# Patient Record
Sex: Male | Born: 1940 | Race: White | Hispanic: No | Marital: Married | Smoking: Former smoker
Health system: Southern US, Community
[De-identification: ages and names within clinical notes are randomized; demographics above are authoritative.]

## PROBLEM LIST (undated history)

## (undated) DIAGNOSIS — I5020 Unspecified systolic (congestive) heart failure: Secondary | ICD-10-CM

## (undated) DIAGNOSIS — H9190 Unspecified hearing loss, unspecified ear: Secondary | ICD-10-CM

## (undated) DIAGNOSIS — Z8739 Personal history of other diseases of the musculoskeletal system and connective tissue: Secondary | ICD-10-CM

## (undated) DIAGNOSIS — I2510 Atherosclerotic heart disease of native coronary artery without angina pectoris: Secondary | ICD-10-CM

## (undated) DIAGNOSIS — L570 Actinic keratosis: Secondary | ICD-10-CM

## (undated) DIAGNOSIS — E785 Hyperlipidemia, unspecified: Secondary | ICD-10-CM

## (undated) DIAGNOSIS — I428 Other cardiomyopathies: Secondary | ICD-10-CM

## (undated) DIAGNOSIS — M545 Low back pain: Secondary | ICD-10-CM

## (undated) DIAGNOSIS — M109 Gout, unspecified: Secondary | ICD-10-CM

## (undated) DIAGNOSIS — K567 Ileus, unspecified: Secondary | ICD-10-CM

## (undated) DIAGNOSIS — I219 Acute myocardial infarction, unspecified: Secondary | ICD-10-CM

## (undated) DIAGNOSIS — M5135 Other intervertebral disc degeneration, thoracolumbar region: Secondary | ICD-10-CM

## (undated) DIAGNOSIS — K929 Disease of digestive system, unspecified: Secondary | ICD-10-CM

## (undated) DIAGNOSIS — J189 Pneumonia, unspecified organism: Secondary | ICD-10-CM

## (undated) DIAGNOSIS — I209 Angina pectoris, unspecified: Secondary | ICD-10-CM

## (undated) DIAGNOSIS — R634 Abnormal weight loss: Secondary | ICD-10-CM

## (undated) DIAGNOSIS — R0600 Dyspnea, unspecified: Secondary | ICD-10-CM

## (undated) DIAGNOSIS — J449 Chronic obstructive pulmonary disease, unspecified: Secondary | ICD-10-CM

## (undated) DIAGNOSIS — B999 Unspecified infectious disease: Secondary | ICD-10-CM

## (undated) DIAGNOSIS — I509 Heart failure, unspecified: Secondary | ICD-10-CM

## (undated) DIAGNOSIS — K222 Esophageal obstruction: Secondary | ICD-10-CM

## (undated) DIAGNOSIS — K435 Parastomal hernia without obstruction or  gangrene: Secondary | ICD-10-CM

## (undated) DIAGNOSIS — I700 Atherosclerosis of aorta: Secondary | ICD-10-CM

## (undated) DIAGNOSIS — K649 Unspecified hemorrhoids: Secondary | ICD-10-CM

## (undated) DIAGNOSIS — D126 Benign neoplasm of colon, unspecified: Secondary | ICD-10-CM

## (undated) DIAGNOSIS — E041 Nontoxic single thyroid nodule: Secondary | ICD-10-CM

## (undated) DIAGNOSIS — M1990 Unspecified osteoarthritis, unspecified site: Secondary | ICD-10-CM

## (undated) DIAGNOSIS — N189 Chronic kidney disease, unspecified: Secondary | ICD-10-CM

## (undated) DIAGNOSIS — I10 Essential (primary) hypertension: Secondary | ICD-10-CM

## (undated) DIAGNOSIS — K449 Diaphragmatic hernia without obstruction or gangrene: Secondary | ICD-10-CM

## (undated) DIAGNOSIS — N400 Enlarged prostate without lower urinary tract symptoms: Secondary | ICD-10-CM

## (undated) DIAGNOSIS — K219 Gastro-esophageal reflux disease without esophagitis: Secondary | ICD-10-CM

## (undated) DIAGNOSIS — Z87442 Personal history of urinary calculi: Secondary | ICD-10-CM

## (undated) DIAGNOSIS — Z7901 Long term (current) use of anticoagulants: Secondary | ICD-10-CM

## (undated) DIAGNOSIS — N200 Calculus of kidney: Secondary | ICD-10-CM

## (undated) DIAGNOSIS — J45909 Unspecified asthma, uncomplicated: Secondary | ICD-10-CM

## (undated) DIAGNOSIS — Z955 Presence of coronary angioplasty implant and graft: Secondary | ICD-10-CM

## (undated) DIAGNOSIS — M170 Bilateral primary osteoarthritis of knee: Secondary | ICD-10-CM

## (undated) DIAGNOSIS — K8020 Calculus of gallbladder without cholecystitis without obstruction: Secondary | ICD-10-CM

## (undated) HISTORY — DX: Actinic keratosis: L57.0

## (undated) HISTORY — DX: Low back pain: M54.5

## (undated) HISTORY — PX: OTHER SURGICAL HISTORY: SHX169

## (undated) HISTORY — DX: Essential (primary) hypertension: I10

## (undated) HISTORY — DX: Chronic obstructive pulmonary disease, unspecified: J44.9

## (undated) HISTORY — DX: Unspecified hearing loss, unspecified ear: H91.90

## (undated) HISTORY — DX: Atherosclerotic heart disease of native coronary artery without angina pectoris: I25.10

## (undated) HISTORY — DX: Bilateral primary osteoarthritis of knee: M17.0

## (undated) HISTORY — PX: COLON SURGERY: SHX602

## (undated) HISTORY — DX: Hyperlipidemia, unspecified: E78.5

## (undated) HISTORY — DX: Disease of digestive system, unspecified: K92.9

## (undated) HISTORY — DX: Personal history of other diseases of the musculoskeletal system and connective tissue: Z87.39

---

## 2002-12-19 DIAGNOSIS — I219 Acute myocardial infarction, unspecified: Secondary | ICD-10-CM

## 2002-12-19 HISTORY — DX: Acute myocardial infarction, unspecified: I21.9

## 2005-01-06 ENCOUNTER — Emergency Department: Payer: Self-pay | Admitting: Internal Medicine

## 2005-01-06 DIAGNOSIS — I2109 ST elevation (STEMI) myocardial infarction involving other coronary artery of anterior wall: Secondary | ICD-10-CM

## 2005-01-06 HISTORY — PX: CORONARY ANGIOPLASTY WITH STENT PLACEMENT: SHX49

## 2005-01-06 HISTORY — DX: ST elevation (STEMI) myocardial infarction involving other coronary artery of anterior wall: I21.09

## 2005-07-26 ENCOUNTER — Ambulatory Visit: Payer: Self-pay | Admitting: Unknown Physician Specialty

## 2005-10-20 ENCOUNTER — Ambulatory Visit: Payer: Self-pay | Admitting: Unknown Physician Specialty

## 2005-12-30 ENCOUNTER — Ambulatory Visit: Payer: Self-pay | Admitting: Orthopaedic Surgery

## 2006-01-09 ENCOUNTER — Ambulatory Visit: Payer: Self-pay | Admitting: Orthopaedic Surgery

## 2006-01-09 ENCOUNTER — Other Ambulatory Visit: Payer: Self-pay

## 2006-01-20 ENCOUNTER — Ambulatory Visit: Payer: Self-pay | Admitting: Orthopaedic Surgery

## 2007-08-09 ENCOUNTER — Ambulatory Visit: Payer: Self-pay

## 2008-03-31 ENCOUNTER — Ambulatory Visit: Payer: Self-pay | Admitting: Unknown Physician Specialty

## 2008-05-15 ENCOUNTER — Other Ambulatory Visit: Payer: Self-pay

## 2008-05-15 ENCOUNTER — Observation Stay: Payer: Self-pay | Admitting: Internal Medicine

## 2008-05-16 HISTORY — PX: LEFT HEART CATH AND CORONARY ANGIOGRAPHY: CATH118249

## 2009-03-30 HISTORY — PX: CORONARY ANGIOPLASTY WITH STENT PLACEMENT: SHX49

## 2009-05-14 ENCOUNTER — Encounter: Payer: Self-pay | Admitting: Internal Medicine

## 2009-05-19 ENCOUNTER — Encounter: Payer: Self-pay | Admitting: Internal Medicine

## 2009-06-18 ENCOUNTER — Encounter: Payer: Self-pay | Admitting: Internal Medicine

## 2010-11-16 ENCOUNTER — Inpatient Hospital Stay: Payer: Self-pay | Admitting: Surgery

## 2012-02-13 DIAGNOSIS — Z87891 Personal history of nicotine dependence: Secondary | ICD-10-CM | POA: Insufficient documentation

## 2012-02-13 DIAGNOSIS — E785 Hyperlipidemia, unspecified: Secondary | ICD-10-CM

## 2012-02-13 DIAGNOSIS — I2510 Atherosclerotic heart disease of native coronary artery without angina pectoris: Secondary | ICD-10-CM

## 2012-02-13 DIAGNOSIS — I10 Essential (primary) hypertension: Secondary | ICD-10-CM

## 2012-02-13 HISTORY — DX: Atherosclerotic heart disease of native coronary artery without angina pectoris: I25.10

## 2012-02-13 HISTORY — DX: Essential (primary) hypertension: I10

## 2012-02-13 HISTORY — DX: Hyperlipidemia, unspecified: E78.5

## 2013-03-09 DIAGNOSIS — K929 Disease of digestive system, unspecified: Secondary | ICD-10-CM

## 2013-03-09 DIAGNOSIS — J45909 Unspecified asthma, uncomplicated: Secondary | ICD-10-CM | POA: Insufficient documentation

## 2013-03-09 DIAGNOSIS — H9190 Unspecified hearing loss, unspecified ear: Secondary | ICD-10-CM

## 2013-03-09 HISTORY — DX: Unspecified hearing loss, unspecified ear: H91.90

## 2013-03-09 HISTORY — DX: Disease of digestive system, unspecified: K92.9

## 2013-04-29 ENCOUNTER — Ambulatory Visit: Payer: Self-pay | Admitting: Unknown Physician Specialty

## 2013-04-30 LAB — PATHOLOGY REPORT

## 2014-08-12 ENCOUNTER — Ambulatory Visit: Payer: Self-pay | Admitting: Internal Medicine

## 2014-12-19 HISTORY — PX: MEDIAL PARTIAL KNEE REPLACEMENT: SHX5965

## 2015-05-05 DIAGNOSIS — M170 Bilateral primary osteoarthritis of knee: Secondary | ICD-10-CM | POA: Insufficient documentation

## 2015-05-05 HISTORY — DX: Bilateral primary osteoarthritis of knee: M17.0

## 2015-05-07 ENCOUNTER — Other Ambulatory Visit: Payer: Self-pay | Admitting: Unknown Physician Specialty

## 2015-05-07 DIAGNOSIS — M170 Bilateral primary osteoarthritis of knee: Secondary | ICD-10-CM

## 2015-05-13 ENCOUNTER — Ambulatory Visit
Admission: RE | Admit: 2015-05-13 | Discharge: 2015-05-13 | Disposition: A | Discharge status: Home / Self Care | Payer: Medicare Other | Source: Ambulatory Visit | Attending: Unknown Physician Specialty | Admitting: Unknown Physician Specialty

## 2015-05-13 DIAGNOSIS — M948X6 Other specified disorders of cartilage, lower leg: Secondary | ICD-10-CM | POA: Diagnosis not present

## 2015-05-13 DIAGNOSIS — S83241A Other tear of medial meniscus, current injury, right knee, initial encounter: Secondary | ICD-10-CM | POA: Diagnosis not present

## 2015-05-13 DIAGNOSIS — M170 Bilateral primary osteoarthritis of knee: Secondary | ICD-10-CM

## 2015-05-13 DIAGNOSIS — M25462 Effusion, left knee: Secondary | ICD-10-CM | POA: Diagnosis not present

## 2015-05-13 DIAGNOSIS — S83242A Other tear of medial meniscus, current injury, left knee, initial encounter: Secondary | ICD-10-CM | POA: Insufficient documentation

## 2015-05-13 DIAGNOSIS — M25461 Effusion, right knee: Secondary | ICD-10-CM | POA: Diagnosis not present

## 2015-05-13 DIAGNOSIS — M2242 Chondromalacia patellae, left knee: Secondary | ICD-10-CM | POA: Diagnosis not present

## 2015-06-24 ENCOUNTER — Other Ambulatory Visit: Payer: Self-pay | Admitting: Unknown Physician Specialty

## 2015-06-24 DIAGNOSIS — M1711 Unilateral primary osteoarthritis, right knee: Secondary | ICD-10-CM

## 2015-06-26 ENCOUNTER — Ambulatory Visit
Admission: RE | Admit: 2015-06-26 | Discharge: 2015-06-26 | Disposition: A | Discharge status: Home / Self Care | Payer: Medicare Other | Source: Ambulatory Visit | Attending: Unknown Physician Specialty | Admitting: Unknown Physician Specialty

## 2015-06-26 DIAGNOSIS — M1711 Unilateral primary osteoarthritis, right knee: Secondary | ICD-10-CM

## 2015-06-26 DIAGNOSIS — N201 Calculus of ureter: Secondary | ICD-10-CM | POA: Diagnosis not present

## 2015-06-26 DIAGNOSIS — M179 Osteoarthritis of knee, unspecified: Secondary | ICD-10-CM | POA: Diagnosis not present

## 2015-06-26 DIAGNOSIS — M25561 Pain in right knee: Secondary | ICD-10-CM | POA: Diagnosis present

## 2015-07-30 DIAGNOSIS — M25551 Pain in right hip: Secondary | ICD-10-CM | POA: Insufficient documentation

## 2015-07-30 DIAGNOSIS — Z96651 Presence of right artificial knee joint: Secondary | ICD-10-CM | POA: Insufficient documentation

## 2015-08-17 ENCOUNTER — Other Ambulatory Visit: Payer: Self-pay | Admitting: Unknown Physician Specialty

## 2015-08-17 DIAGNOSIS — M1712 Unilateral primary osteoarthritis, left knee: Secondary | ICD-10-CM

## 2015-08-17 DIAGNOSIS — M1711 Unilateral primary osteoarthritis, right knee: Secondary | ICD-10-CM

## 2015-09-04 ENCOUNTER — Ambulatory Visit
Admission: RE | Admit: 2015-09-04 | Discharge: 2015-09-04 | Disposition: A | Discharge status: Home / Self Care | Payer: Medicare Other | Source: Ambulatory Visit | Attending: Unknown Physician Specialty | Admitting: Unknown Physician Specialty

## 2015-09-04 ENCOUNTER — Other Ambulatory Visit: Payer: Self-pay | Admitting: Unknown Physician Specialty

## 2015-09-04 DIAGNOSIS — M79604 Pain in right leg: Secondary | ICD-10-CM | POA: Diagnosis not present

## 2015-09-04 DIAGNOSIS — M79661 Pain in right lower leg: Secondary | ICD-10-CM

## 2015-09-04 DIAGNOSIS — M7989 Other specified soft tissue disorders: Secondary | ICD-10-CM | POA: Insufficient documentation

## 2015-10-21 ENCOUNTER — Ambulatory Visit
Admission: RE | Admit: 2015-10-21 | Discharge: 2015-10-21 | Disposition: A | Discharge status: Home / Self Care | Payer: Medicare Other | Source: Ambulatory Visit | Attending: Unknown Physician Specialty | Admitting: Unknown Physician Specialty

## 2015-10-21 DIAGNOSIS — M1712 Unilateral primary osteoarthritis, left knee: Secondary | ICD-10-CM | POA: Diagnosis not present

## 2015-10-21 DIAGNOSIS — M7989 Other specified soft tissue disorders: Secondary | ICD-10-CM | POA: Insufficient documentation

## 2015-10-21 DIAGNOSIS — M8580 Other specified disorders of bone density and structure, unspecified site: Secondary | ICD-10-CM | POA: Insufficient documentation

## 2015-10-22 ENCOUNTER — Other Ambulatory Visit: Payer: Self-pay | Admitting: Unknown Physician Specialty

## 2015-10-22 DIAGNOSIS — M1712 Unilateral primary osteoarthritis, left knee: Secondary | ICD-10-CM

## 2015-10-23 ENCOUNTER — Ambulatory Visit
Admission: RE | Admit: 2015-10-23 | Discharge: 2015-10-23 | Disposition: A | Discharge status: Home / Self Care | Payer: Medicare Other | Source: Ambulatory Visit | Attending: Internal Medicine | Admitting: Internal Medicine

## 2015-10-23 ENCOUNTER — Other Ambulatory Visit: Payer: Self-pay | Admitting: Internal Medicine

## 2015-10-23 DIAGNOSIS — M7989 Other specified soft tissue disorders: Secondary | ICD-10-CM | POA: Insufficient documentation

## 2015-12-23 DIAGNOSIS — Z96652 Presence of left artificial knee joint: Secondary | ICD-10-CM | POA: Insufficient documentation

## 2016-04-14 DIAGNOSIS — M545 Low back pain, unspecified: Secondary | ICD-10-CM | POA: Insufficient documentation

## 2016-04-14 HISTORY — DX: Low back pain, unspecified: M54.50

## 2016-11-03 DIAGNOSIS — Z96652 Presence of left artificial knee joint: Secondary | ICD-10-CM | POA: Insufficient documentation

## 2016-12-09 DIAGNOSIS — Z8739 Personal history of other diseases of the musculoskeletal system and connective tissue: Secondary | ICD-10-CM | POA: Insufficient documentation

## 2016-12-09 HISTORY — DX: Personal history of other diseases of the musculoskeletal system and connective tissue: Z87.39

## 2017-07-03 ENCOUNTER — Encounter: Payer: Self-pay | Admitting: Urology

## 2017-07-03 ENCOUNTER — Ambulatory Visit (INDEPENDENT_AMBULATORY_CARE_PROVIDER_SITE_OTHER): Payer: Medicare Other | Admitting: Urology

## 2017-07-03 VITALS — BP 143/79 | HR 98 | Ht 69.0 in | Wt 199.0 lb

## 2017-07-03 DIAGNOSIS — R3121 Asymptomatic microscopic hematuria: Secondary | ICD-10-CM

## 2017-07-03 DIAGNOSIS — J449 Chronic obstructive pulmonary disease, unspecified: Secondary | ICD-10-CM

## 2017-07-03 DIAGNOSIS — E785 Hyperlipidemia, unspecified: Secondary | ICD-10-CM

## 2017-07-03 HISTORY — DX: Chronic obstructive pulmonary disease, unspecified: J44.9

## 2017-07-03 HISTORY — DX: Hyperlipidemia, unspecified: E78.5

## 2017-07-03 LAB — MICROSCOPIC EXAMINATION: Bacteria, UA: NONE SEEN

## 2017-07-03 LAB — URINALYSIS, COMPLETE
Bilirubin, UA: NEGATIVE
Glucose, UA: NEGATIVE
Ketones, UA: NEGATIVE
Leukocytes, UA: NEGATIVE
Nitrite, UA: NEGATIVE
Protein, UA: NEGATIVE
Specific Gravity, UA: 1.025 (ref 1.005–1.030)
Urobilinogen, Ur: 0.2 mg/dL (ref 0.2–1.0)
pH, UA: 5.5 (ref 5.0–7.5)

## 2017-07-03 NOTE — Progress Notes (Signed)
07/03/2017 4:34 PM   Todd Rojas December 22, 1940 [id number]  Referring provider: Tracie Harrier, MD [address] [facility] [place], [place] [address]  Chief Complaint  Patient presents with  . Hematuria    New Patient    HPI: 76 year old male who is referred for further evaluation and management of a asymptomatic microscopic hematuria. This was done as part of a annual evaluation for the patient. He was told he had microscopic blood in his urine, is never seen blood himself. The patient denies any progressive urinary tract symptoms including frequency, urgency, dysuria. He has not had any worsening nocturia. He denies a history of urinary tract infections or prostatitis. He has no history of kidney stones.  The patient is a nonsmoker, does not smoke for last 30 years. He has no exposure to chemicals or carcinogenic agents. He has no family history of GU malignancy.    The patient recently had labs performed with a BUN/creatinine that was normal-creatinine 1.2. His PSA was 2.7.   PMH: Past Medical History:  Diagnosis Date  . CAD (coronary artery disease) 02/13/2012   Overview:   12/2004 - Anginal equivalent symptoms:  Left arm pain, weakness, and shortness of breath.   12/2004:  Anterior STEMI.  Transferred to [facility].   01/06/2005 - Cardiac cath:  EF 51%, an occluded mid LAD and a large OM2 with 75% ostial stenosis.  PCI of the mid occluded LAD using a Cypher stent.   03/2005 - 2D echo:  EF >55%, LAE 4.7 cm, no significant valvular disease.   02/2008:  Plavix stopped.   06/2008:  Recurrent angina - symptoms of chest tightness.  Presented to [facility].  Cardiac catheterization.  The patient stated nonobstructive CAD seen.  Plavix restarted at that time.   03/2009 - 2D echo:  EF 35%, akinetic apex and septum.  Stress echocardiogram deferred for cardiac catheterization.   03/30/2009:  PCI 90% proximal LAD with a 3.0 x 18 mm Xience DES.   02/2010:  Slow and gradual return of chest tightness, fatigue and generalized weakness concerning for return of anginal equivalent.  Stress test did not show ischemia to the heart muscle during exercise.  Evidenc  . COPD (chronic obstructive pulmonary disease) (Todd Rojas) 07/03/2017  . Dyslipidemia 02/13/2012   Overview:   03/2009  TC 158, TG 88, HDL 39, LDL 102.  11/2013  TC 148, TG 115, HDL 44.3, LDL 80.7 (done at PCP's office)   10/2014 TC 146  11/2015 TC 123, TG 86, HDL 41.7, LDL 64  11/2016 TC 140, TG 129, HDL 40.1, LDL 74   . GI disease 03/09/2013   Overview:   Dysphagia - EGD demonstrated esophageal stricture which was dilated   GERD   Hemorrhoids   . Hearing loss 03/09/2013  . Hyperlipidemia, unspecified 07/03/2017  . Hypertension 02/13/2012  . Personal history of gout 12/09/2016  . Primary osteoarthritis of both knees 05/05/2015  . Right-sided low back pain without sciatica 04/14/2016    Surgical History: Past Surgical History:  Procedure Laterality Date  . MEDIAL PARTIAL KNEE REPLACEMENT Bilateral 2016    Home Medications:  Allergies as of 07/03/2017   Not on File     Medication List       Accurate as of 07/03/17  4:34 PM. Always use your most recent med list.          allopurinol 300 MG tablet Commonly known as:  ZYLOPRIM Take by mouth.   aspirin EC 81 MG  tablet Take by mouth.   cyanocobalamin 1000 MCG/ML injection Commonly known as:  (VITAMIN B-12) Inject into the muscle.   lisinopril 40 MG tablet Commonly known as:  PRINIVIL,ZESTRIL Take by mouth.   pravastatin 80 MG tablet Commonly known as:  PRAVACHOL Take by mouth.   RIVAROXABAN PO STUDY DRUG - 2.5mg  twice daily       Allergies: Not on File  Family History: Family History  Problem Relation Age of Onset  . Prostate cancer Neg Hx   . Kidney cancer Neg Hx     Social History:  reports that he has quit smoking. He has never used smokeless tobacco. He reports that he does not drink alcohol or use  drugs.  ROS: UROLOGY Frequent Urination?: Yes Hard to postpone urination?: No Burning/pain with urination?: No Get up at night to urinate?: Yes Leakage of urine?: No Urine stream starts and stops?: Yes Trouble starting stream?: No Do you have to strain to urinate?: No Blood in urine?: Yes Urinary tract infection?: No Sexually transmitted disease?: No Injury to kidneys or bladder?: No Painful intercourse?: No Weak stream?: No Erection problems?: No Penile pain?: No  Gastrointestinal Nausea?: No Vomiting?: No Indigestion/heartburn?: Yes Diarrhea?: Yes Constipation?: No  Constitutional Fever: No Night sweats?: No Weight loss?: No Fatigue?: No  Skin Skin rash/lesions?: No Itching?: No  Eyes Blurred vision?: No Double vision?: No  Ears/Nose/Throat Sore throat?: No Sinus problems?: No  Hematologic/Lymphatic Swollen glands?: No Easy bruising?: Yes  Cardiovascular Leg swelling?: No Chest pain?: No  Respiratory Cough?: No Shortness of breath?: No  Endocrine Excessive thirst?: No  Musculoskeletal Back pain?: No Joint pain?: Yes  Neurological Headaches?: No Dizziness?: Yes  Psychologic Depression?: No Anxiety?: No  Physical Exam: BP (!) 143/79   Pulse 98   Ht 5\' 9"  (1.753 m)   Wt 90.3 kg (199 lb)   BMI 29.39 kg/m   Constitutional:  Alert and oriented, No acute distress. HEENT: [place] AT, moist mucus membranes.  Trachea midline, no masses. Cardiovascular: No clubbing, cyanosis, or edema. Respiratory: Normal respiratory effort, no increased work of breathing. GI: Abdomen is soft, nontender, nondistended, no abdominal masses GU: No CVA tenderness.  Skin: No rashes, bruises or suspicious lesions. Lymph: No cervical or inguinal adenopathy. Neurologic: Grossly intact, no focal deficits, moving all 4 extremities. Psychiatric: Normal mood and affect.  Laboratory Data: No results found for: WBC, HGB, HCT, MCV, PLT  No results found for:  CREATININE  No results found for: PSA  No results found for: TESTOSTERONE  No results found for: HGBA1C  Urinalysis No results found for: COLORURINE, APPEARANCEUR, LABSPEC, PHURINE, GLUCOSEU, HGBUR, BILIRUBINUR, KETONESUR, PROTEINUR, UROBILINOGEN, NITRITE, LEUKOCYTESUR  Pertinent Imaging: None  Assessment & Plan:  The patient has been diagnosed with asymptomatic microscopic hematuria. We discussed the workup for this evaluation including CT of the abdomen and pelvis and cystoscopy.  1. Gross hematuria The plan is for the patient to obtain a hematuria protocol CT scan and follow-up shortly thereafter for cystoscopy and review of the CT scan. - Urinalysis, Complete   No Follow-up on file.  Ardis Hughs, [facility] [address], [place], [place] [address] [phone]

## 2017-07-17 ENCOUNTER — Ambulatory Visit: Payer: Medicare Other

## 2017-07-17 ENCOUNTER — Ambulatory Visit
Admission: RE | Admit: 2017-07-17 | Discharge: 2017-07-17 | Disposition: A | Discharge status: Home / Self Care | Payer: Medicare Other | Source: Ambulatory Visit | Attending: Urology | Admitting: Urology

## 2017-07-17 DIAGNOSIS — N400 Enlarged prostate without lower urinary tract symptoms: Secondary | ICD-10-CM | POA: Diagnosis not present

## 2017-07-17 DIAGNOSIS — R3121 Asymptomatic microscopic hematuria: Secondary | ICD-10-CM | POA: Diagnosis not present

## 2017-07-17 DIAGNOSIS — N202 Calculus of kidney with calculus of ureter: Secondary | ICD-10-CM | POA: Insufficient documentation

## 2017-07-17 DIAGNOSIS — N2889 Other specified disorders of kidney and ureter: Secondary | ICD-10-CM | POA: Diagnosis not present

## 2017-07-17 HISTORY — DX: Unspecified asthma, uncomplicated: J45.909

## 2017-07-17 HISTORY — DX: Heart failure, unspecified: I50.9

## 2017-07-17 LAB — POCT I-STAT CREATININE: Creatinine, Ser: 1.2 mg/dL (ref 0.61–1.24)

## 2017-07-17 MED ORDER — IOPAMIDOL (ISOVUE-300) INJECTION 61%
125.0000 mL | Freq: Once | INTRAVENOUS | Status: AC | PRN
  Administered 2017-07-17: 125 mL via INTRAVENOUS

## 2017-07-20 ENCOUNTER — Ambulatory Visit (INDEPENDENT_AMBULATORY_CARE_PROVIDER_SITE_OTHER): Payer: Medicare Other | Admitting: Urology

## 2017-07-20 ENCOUNTER — Encounter: Payer: Self-pay | Admitting: Urology

## 2017-07-20 VITALS — BP 141/66 | HR 84 | Ht 69.0 in | Wt 200.4 lb

## 2017-07-20 DIAGNOSIS — N2889 Other specified disorders of kidney and ureter: Secondary | ICD-10-CM

## 2017-07-20 DIAGNOSIS — N200 Calculus of kidney: Secondary | ICD-10-CM

## 2017-07-20 DIAGNOSIS — R3121 Asymptomatic microscopic hematuria: Secondary | ICD-10-CM

## 2017-07-20 LAB — MICROSCOPIC EXAMINATION: BACTERIA UA: NONE SEEN

## 2017-07-20 LAB — URINALYSIS, COMPLETE
Bilirubin, UA: NEGATIVE
Glucose, UA: NEGATIVE
Ketones, UA: NEGATIVE
NITRITE UA: NEGATIVE
Protein, UA: NEGATIVE
Specific Gravity, UA: 1.025 (ref 1.005–1.030)
UUROB: 0.2 mg/dL (ref 0.2–1.0)
pH, UA: 6 (ref 5.0–7.5)

## 2017-07-20 MED ORDER — LIDOCAINE HCL 2 % EX GEL
1.0000 "application " | Freq: Once | CUTANEOUS | Status: AC
  Administered 2017-07-20: 1 via URETHRAL

## 2017-07-20 MED ORDER — CIPROFLOXACIN HCL 500 MG PO TABS
500.0000 mg | ORAL_TABLET | Freq: Once | ORAL | Status: AC
  Administered 2017-07-20: 500 mg via ORAL

## 2017-07-20 NOTE — Progress Notes (Signed)
   07/20/17  CC: No chief complaint on file.   HPI: 76-year-old male who is referred for further evaluation and management of a asymptomatic microscopic hematuria. This was done as part of a annual evaluation for the patient. He was told he had microscopic blood in his urine, is never seen blood himself. The patient denies any progressive urinary tract symptoms including frequency, urgency, dysuria. He has not had any worsening nocturia. He denies a history of urinary tract infections or prostatitis. He has no history of kidney stones.  The patient is a nonsmoker, does not smoke for last 30 years. He has no exposure to chemicals or carcinogenic agents. He has no family history of GU malignancy.    The patient recently had labs performed with a BUN/creatinine that was normal-creatinine 1.2. His PSA was 2.7.  CT urogram showed an 8 mm left lower pole nonobstructing renal calculus as well as a left ureterocele with a 10 mm calculus within the distal left ureter. The ureter was dilated secondary to this ureterocele.  There were no vitals taken for this visit. NED. A&Ox3.   No respiratory distress   Abd soft, NT, ND Normal phallus with bilateral descended testicles  Cystoscopy Procedure Note  Patient identification was confirmed, informed consent was obtained, and patient was prepped using Betadine solution.  Lidocaine jelly was administered per urethral meatus.    Preoperative abx where received prior to procedure.     Pre-Procedure: - Inspection reveals a normal caliber ureteral meatus.  Procedure: The flexible cystoscope was introduced without difficulty - No urethral strictures/lesions are present. 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 Mild hypervascularity. 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 Consistent with CT imaging. - Bladder mucosa  reveals no ulcers, tumors, or lesions - No bladder  stones - No trabeculation  Retroflexion shows no intravesical lobe  Post-Procedure: - Patient tolerated the procedure well  Assessment/ Plan:  1. 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 We discussed that this is likely been there for life. However he does have a 10 mm stone in his distal ureter that will likely become symptomatic or cause an issue at some point. Also the lead to renal atrophy on that side if it forms an obstruction. We discussed undergoing a cystoscopy, left ureterocele incision, left ureteroscopy, laser lithotripsy, left ureteral stent placement. We discussed the risks and benefits of this procedure. He understands the risks include but aren't limited to bleeding, infection, iatrogenic injury, reflux of urine, and need for repeat procedures. All questions answered. The patient has elected to proceed.  2. Left nonobstructing 8 mm lower pole stone -Will address at time of the above procedure  3. Microscopic hematuria -negative workup except for above which is likely source.  

## 2017-07-21 ENCOUNTER — Telehealth: Payer: Self-pay | Admitting: Radiology

## 2017-07-21 ENCOUNTER — Other Ambulatory Visit: Payer: Self-pay | Admitting: Radiology

## 2017-07-21 DIAGNOSIS — N2889 Other specified disorders of kidney and ureter: Secondary | ICD-10-CM

## 2017-07-21 DIAGNOSIS — N200 Calculus of kidney: Secondary | ICD-10-CM

## 2017-07-21 NOTE — Telephone Encounter (Signed)
Pt scheduled for URS & incision of left ureterocele with Dr Pilar Jarvis on 08/09/17. Made pt aware of surgery date, pre-admit testing appt & to call day prior to surgery for arrival time to [facility]. Questions answered. Pt voices understanding.

## 2017-07-23 LAB — URINE CULTURE

## 2017-07-25 NOTE — Telephone Encounter (Signed)
Advised pt to hold Xarelto beginning on 08/06/17 but to continue ASA 81mg  prior to surgery per clearance obtained from Dr Posey Pronto. Pt voices understanding.

## 2017-07-26 ENCOUNTER — Encounter
Admission: RE | Admit: 2017-07-26 | Discharge: 2017-07-26 | Disposition: A | Discharge status: Home / Self Care | Payer: Medicare Other | Source: Ambulatory Visit | Attending: Urology | Admitting: Urology

## 2017-07-26 DIAGNOSIS — I252 Old myocardial infarction: Secondary | ICD-10-CM | POA: Insufficient documentation

## 2017-07-26 DIAGNOSIS — Z01810 Encounter for preprocedural cardiovascular examination: Secondary | ICD-10-CM | POA: Insufficient documentation

## 2017-07-26 HISTORY — DX: Gastro-esophageal reflux disease without esophagitis: K21.9

## 2017-07-26 HISTORY — DX: Acute myocardial infarction, unspecified: I21.9

## 2017-07-26 HISTORY — DX: Dyspnea, unspecified: R06.00

## 2017-07-26 NOTE — Patient Instructions (Signed)
Your procedure is scheduled on: 08/09/17 Wed Report to Same Day Surgery 2nd floor medical mall [facility] Entrance-take elevator on left to 2nd floor.  Check in with surgery information desk.) To find out your arrival time please call [phone] between 1PM - 3PM on 08/08/17 Tues  Remember: Instructions that are not followed completely may result in serious medical risk, up to and including death, or upon the discretion of your surgeon and anesthesiologist your surgery may need to be rescheduled.    _x___ 1. Do not eat food or drink liquids after midnight. No gum chewing or                              hard candies.     __x__ 2. No Alcohol for 24 hours before or after surgery.   __x__3. No Smoking for 24 prior to surgery.   ____  4. Bring all medications with you on the day of surgery if instructed.    __x__ 5. Notify your doctor if there is any change in your medical condition     (cold, fever, infections).     Do not wear jewelry, make-up, hairpins, clips or nail polish.  Do not wear lotions, powders, or perfumes. You may wear deodorant.  Do not shave 48 hours prior to surgery. Men may shave face and neck.  Do not bring valuables to the hospital.    [facility] is not responsible for any belongings or valuables.               Contacts, dentures or bridgework may not be worn into surgery.  Leave your suitcase in the car. After surgery it may be brought to your room.  For patients admitted to the hospital, discharge time is determined by your                       treatment team.   Patients discharged the day of surgery will not be allowed to drive home.  You will need someone to drive you home and stay with you the night of your procedure.    Please read over the following fact sheets that you were given:   [facility] Preparing for Surgery and or MRSA Information   _x___ Take anti-hypertensive (unless it includes a diuretic), cardiac, seizure, asthma,     anti-reflux and  psychiatric medicines. These include:  1. lisinopril (PRINIVIL,ZESTRIL)  2.  3.  4.  5.  6.  ____Fleets enema or Magnesium Citrate as directed.   _x___ Use CHG Soap or sage wipes as directed on instruction sheet   ____ Use inhalers on the day of surgery and bring to hospital day of surgery  ____ Stop Metformin and Janumet 2 days prior to surgery.    ____ Take 1/2 of usual insulin dose the night before surgery and none on the morning     surgery.   _x___ Follow recommendations from Cardiologist, Pulmonologist or PCP regarding          stopping Aspirin, Coumadin, Pllavix ,Eliquis, Effient, or Pradaxa, and Pletal.  X____Stop Anti-inflammatories such as Advil, Aleve, Ibuprofen, Motrin, Naproxen, Naprosyn, Goodies powders or aspirin products.   Stop RIVAROXABAN PO 3 days before surgery.   OK to take Tylenol and                          Celebrex.   _x___ Stop  supplements until after surgery.  But may continue Vitamin D, Vitamin B,       and multivitamin.   ____ Bring C-Pap to the hospital.

## 2017-07-26 NOTE — Pre-Procedure Instructions (Signed)
Clearance on chart from Dr Posey Pronto, [facility] cardiology.

## 2017-08-08 MED ORDER — CEFAZOLIN SODIUM-DEXTROSE 2-4 GM/100ML-% IV SOLN
2.0000 g | INTRAVENOUS | Status: AC
  Administered 2017-08-09: 2 g via INTRAVENOUS

## 2017-08-09 ENCOUNTER — Ambulatory Visit: Payer: Medicare Other | Admitting: Anesthesiology

## 2017-08-09 ENCOUNTER — Ambulatory Visit
Admission: RE | Admit: 2017-08-09 | Discharge: 2017-08-09 | Disposition: A | Discharge status: Home / Self Care | Payer: Medicare Other | Source: Ambulatory Visit | Attending: Urology | Admitting: Urology

## 2017-08-09 ENCOUNTER — Encounter
Admission: RE | Disposition: A | Discharge status: Home / Self Care | Payer: Self-pay | Source: Ambulatory Visit | Attending: Urology

## 2017-08-09 ENCOUNTER — Encounter: Payer: Self-pay | Admitting: *Deleted

## 2017-08-09 DIAGNOSIS — I110 Hypertensive heart disease with heart failure: Secondary | ICD-10-CM | POA: Diagnosis not present

## 2017-08-09 DIAGNOSIS — J449 Chronic obstructive pulmonary disease, unspecified: Secondary | ICD-10-CM | POA: Diagnosis not present

## 2017-08-09 DIAGNOSIS — N202 Calculus of kidney with calculus of ureter: Secondary | ICD-10-CM | POA: Insufficient documentation

## 2017-08-09 DIAGNOSIS — Z87891 Personal history of nicotine dependence: Secondary | ICD-10-CM | POA: Diagnosis not present

## 2017-08-09 DIAGNOSIS — R3121 Asymptomatic microscopic hematuria: Secondary | ICD-10-CM | POA: Diagnosis present

## 2017-08-09 DIAGNOSIS — K219 Gastro-esophageal reflux disease without esophagitis: Secondary | ICD-10-CM | POA: Diagnosis not present

## 2017-08-09 DIAGNOSIS — I252 Old myocardial infarction: Secondary | ICD-10-CM | POA: Diagnosis not present

## 2017-08-09 DIAGNOSIS — N2889 Other specified disorders of kidney and ureter: Secondary | ICD-10-CM | POA: Insufficient documentation

## 2017-08-09 DIAGNOSIS — I2510 Atherosclerotic heart disease of native coronary artery without angina pectoris: Secondary | ICD-10-CM | POA: Insufficient documentation

## 2017-08-09 DIAGNOSIS — I509 Heart failure, unspecified: Secondary | ICD-10-CM | POA: Diagnosis not present

## 2017-08-09 DIAGNOSIS — N200 Calculus of kidney: Secondary | ICD-10-CM

## 2017-08-09 DIAGNOSIS — Q6231 Congenital ureterocele, orthotopic: Secondary | ICD-10-CM | POA: Diagnosis not present

## 2017-08-09 HISTORY — PX: URETEROSCOPY WITH HOLMIUM LASER LITHOTRIPSY: SHX6645

## 2017-08-09 HISTORY — PX: CYSTOSCOPY WITH STENT PLACEMENT: SHX5790

## 2017-08-09 SURGERY — URETEROSCOPY, WITH LITHOTRIPSY USING HOLMIUM LASER
Anesthesia: General | Site: Ureter | Laterality: Left | Wound class: Clean Contaminated

## 2017-08-09 MED ORDER — PROPOFOL 10 MG/ML IV BOLUS
INTRAVENOUS | Status: DC | PRN
  Administered 2017-08-09: 120 mg via INTRAVENOUS

## 2017-08-09 MED ORDER — LIDOCAINE HCL (PF) 2 % IJ SOLN
INTRAMUSCULAR | Status: AC
  Filled 2017-08-09: qty 2

## 2017-08-09 MED ORDER — LACTATED RINGERS IV SOLN
INTRAVENOUS | Status: DC
  Administered 2017-08-09: 11:00:00 via INTRAVENOUS

## 2017-08-09 MED ORDER — CEPHALEXIN 500 MG PO CAPS: 500.0000 mg | ORAL_CAPSULE | Freq: Three times a day (TID) | ORAL | 0 refills | Status: DC

## 2017-08-09 MED ORDER — HYDROCODONE-ACETAMINOPHEN 5-325 MG PO TABS
1.0000 | ORAL_TABLET | Freq: Four times a day (QID) | ORAL | Status: DC | PRN
  Administered 2017-08-09: 1 via ORAL

## 2017-08-09 MED ORDER — FENTANYL CITRATE (PF) 100 MCG/2ML IJ SOLN
INTRAMUSCULAR | Status: AC
  Filled 2017-08-09: qty 2

## 2017-08-09 MED ORDER — PHENYLEPHRINE HCL 10 MG/ML IJ SOLN
INTRAMUSCULAR | Status: DC | PRN
  Administered 2017-08-09: 200 ug via INTRAVENOUS
  Administered 2017-08-09 (×2): 100 ug via INTRAVENOUS

## 2017-08-09 MED ORDER — DEXAMETHASONE SODIUM PHOSPHATE 10 MG/ML IJ SOLN
INTRAMUSCULAR | Status: AC
  Filled 2017-08-09: qty 1

## 2017-08-09 MED ORDER — ROCURONIUM BROMIDE 50 MG/5ML IV SOLN
INTRAVENOUS | Status: AC
  Filled 2017-08-09: qty 1

## 2017-08-09 MED ORDER — DEXAMETHASONE SODIUM PHOSPHATE 10 MG/ML IJ SOLN
INTRAMUSCULAR | Status: DC | PRN
  Administered 2017-08-09: 10 mg via INTRAVENOUS

## 2017-08-09 MED ORDER — EPHEDRINE SULFATE 50 MG/ML IJ SOLN
INTRAMUSCULAR | Status: DC | PRN
  Administered 2017-08-09: 10 mg via INTRAVENOUS

## 2017-08-09 MED ORDER — HYDROCODONE-ACETAMINOPHEN 5-325 MG PO TABS: 1.0000 | ORAL_TABLET | ORAL | 0 refills | Status: DC | PRN

## 2017-08-09 MED ORDER — FENTANYL CITRATE (PF) 100 MCG/2ML IJ SOLN
INTRAMUSCULAR | Status: DC | PRN
  Administered 2017-08-09: 50 ug via INTRAVENOUS
  Administered 2017-08-09 (×2): 25 ug via INTRAVENOUS

## 2017-08-09 MED ORDER — FAMOTIDINE 20 MG PO TABS
20.0000 mg | ORAL_TABLET | Freq: Once | ORAL | Status: AC
  Administered 2017-08-09: 20 mg via ORAL

## 2017-08-09 MED ORDER — CEFAZOLIN SODIUM-DEXTROSE 2-4 GM/100ML-% IV SOLN
INTRAVENOUS | Status: AC
  Filled 2017-08-09: qty 100

## 2017-08-09 MED ORDER — ROCURONIUM BROMIDE 100 MG/10ML IV SOLN
INTRAVENOUS | Status: DC | PRN
  Administered 2017-08-09: 10 mg via INTRAVENOUS
  Administered 2017-08-09: 25 mg via INTRAVENOUS

## 2017-08-09 MED ORDER — ONDANSETRON HCL 4 MG/2ML IJ SOLN: 4.0000 mg | Freq: Once | INTRAMUSCULAR | Status: DC | PRN

## 2017-08-09 MED ORDER — IOTHALAMATE MEGLUMINE 43 % IV SOLN
INTRAVENOUS | Status: DC | PRN
  Administered 2017-08-09: 15 mL via URETHRAL

## 2017-08-09 MED ORDER — HYDROCODONE-ACETAMINOPHEN 5-325 MG PO TABS
ORAL_TABLET | ORAL | Status: AC
  Filled 2017-08-09: qty 1

## 2017-08-09 MED ORDER — SUGAMMADEX SODIUM 200 MG/2ML IV SOLN
INTRAVENOUS | Status: AC
  Filled 2017-08-09: qty 2

## 2017-08-09 MED ORDER — FAMOTIDINE 20 MG PO TABS
ORAL_TABLET | ORAL | Status: AC
  Filled 2017-08-09: qty 1

## 2017-08-09 MED ORDER — LIDOCAINE HCL (CARDIAC) 20 MG/ML IV SOLN
INTRAVENOUS | Status: DC | PRN
  Administered 2017-08-09: 80 mg via INTRAVENOUS

## 2017-08-09 MED ORDER — ONDANSETRON HCL 4 MG/2ML IJ SOLN
INTRAMUSCULAR | Status: DC | PRN
Start: 2017-08-09 — End: 2017-08-09
  Administered 2017-08-09: 4 mg via INTRAVENOUS

## 2017-08-09 MED ORDER — SUGAMMADEX SODIUM 500 MG/5ML IV SOLN
INTRAVENOUS | Status: DC | PRN
  Administered 2017-08-09: 179.6 mg via INTRAVENOUS

## 2017-08-09 MED ORDER — FENTANYL CITRATE (PF) 100 MCG/2ML IJ SOLN: 25.0000 ug | INTRAMUSCULAR | Status: DC | PRN

## 2017-08-09 MED ORDER — PROPOFOL 10 MG/ML IV BOLUS
INTRAVENOUS | Status: AC
  Filled 2017-08-09: qty 20

## 2017-08-09 MED ORDER — ONDANSETRON HCL 4 MG/2ML IJ SOLN
INTRAMUSCULAR | Status: AC
  Filled 2017-08-09: qty 2

## 2017-08-09 SURGICAL SUPPLY — 29 items
BACTOSHIELD CHG 4% 4OZ (MISCELLANEOUS)
BASKET ZERO TIP 1.9FR (BASKET) ×3 IMPLANT
CATH FOL 2WAY LX 20X30 (CATHETERS) ×3 IMPLANT
CATH URETL 5X70 OPEN END (CATHETERS) ×3 IMPLANT
CNTNR SPEC 2.5X3XGRAD LEK (MISCELLANEOUS) ×1
CONT SPEC 4OZ STER OR WHT (MISCELLANEOUS) ×2
CONTAINER SPEC 2.5X3XGRAD LEK (MISCELLANEOUS) ×1 IMPLANT
FIBER LASER LITHO 273 (Laser) ×3 IMPLANT
GLOVE BIO SURGEON STRL SZ7 (GLOVE) ×3 IMPLANT
GLOVE BIO SURGEON STRL SZ7.5 (GLOVE) ×3 IMPLANT
GOWN STRL REUS W/ TWL LRG LVL4 (GOWN DISPOSABLE) ×1 IMPLANT
GOWN STRL REUS W/TWL LRG LVL4 (GOWN DISPOSABLE) ×2
GOWN STRL REUS W/TWL XL LVL3 (GOWN DISPOSABLE) ×3 IMPLANT
GUIDEWIRE SUPER STIFF (WIRE) IMPLANT
INTRODUCER DILATOR DOUBLE (INTRODUCER) IMPLANT
KIT RM TURNOVER CYSTO AR (KITS) ×3 IMPLANT
PACK CYSTO AR (MISCELLANEOUS) ×3 IMPLANT
SCRUB CHG 4% DYNA-HEX 4OZ (MISCELLANEOUS) IMPLANT
SENSORWIRE 0.038 NOT ANGLED (WIRE) ×9
SET CYSTO W/LG BORE CLAMP LF (SET/KITS/TRAYS/PACK) ×3 IMPLANT
SHEATH URETERAL 13/15X36 1L (SHEATH) IMPLANT
SOL .9 NS 3000ML IRR  AL (IV SOLUTION) ×4
SOL .9 NS 3000ML IRR UROMATIC (IV SOLUTION) ×2 IMPLANT
STENT URET 6FRX24 CONTOUR (STENTS) IMPLANT
STENT URET 6FRX26 CONTOUR (STENTS) ×6 IMPLANT
SURGILUBE 2OZ TUBE FLIPTOP (MISCELLANEOUS) ×3 IMPLANT
SYRINGE IRR TOOMEY STRL 70CC (SYRINGE) ×3 IMPLANT
WATER STERILE IRR 1000ML POUR (IV SOLUTION) ×3 IMPLANT
WIRE SENSOR 0.038 NOT ANGLED (WIRE) ×3 IMPLANT

## 2017-08-09 NOTE — Op Note (Signed)
Date of procedure: 08/09/17  Preoperative diagnosis:  1. Left ureterocele 2. Left ureteral stone 3. Left renal stone   Postoperative diagnosis:  1. same   Procedure: 1. Cystoscopy 2. Incision of left ureterocele 3. Left ureteroscopy 4. Laser lithotripsy 5. Stone basketing 6. Left retrograde pyelogram with interpretation 7. Left ureteral stent placement 6 French by 26 cm  Surgeon: Baruch Gouty, MD  Anesthesia: General  Complications: None  Intraoperative findings: the patient had a large left ureterocele that was protruding into the bladder. It was incised with the laser. Upon ureteroscopy the large left ureteral stone was removed intact. His left lower pole stones was dusted.  EBL: none  Specimens: left ureteral stone to pathology  Drains: left 6 French by 26 cm double-J ureteral stent, 20 French Foley catheter  Disposition: Stable to the postanesthesia care unit  Indication for procedure: The patient is a 76 y.o. male with history of left ureterocele with a 1 cm left distal ureteral stone and left lower pole 8 mm stone on a microscopic hematuria workup. He presents today for definitive surgical management.  After reviewing the management options for treatment, the patient elected to proceed with the above surgical procedure(s). We have discussed the potential benefits and risks of the procedure, side effects of the proposed treatment, the likelihood of the patient achieving the goals of the procedure, and any potential problems that might occur during the procedure or recuperation. Informed consent has been obtained.  Description of procedure: The patient was met in the preoperative area. All risks, benefits, and indications of the procedure were described in great detail. The patient consented to the procedure. Preoperative antibiotics were given. The patient was taken to the operative theater. General anesthesia was induced per the anesthesia service. The patient was then  placed in the dorsal lithotomy position and prepped and draped in the usual sterile fashion. A preoperative timeout was called.   A 21 French 30 cystoscope was inserted to the patient's bladder atraumatically per urethra. His large protruding left ureterocele was then immediately visualized. It. Opening was not able to be located. Incision was then made on the anterior surface of the ureterocele approximately 8 cm in size with the laser. With the aid of the ureteroscope a sensor wire was then placed up to the level of the left renal pelvis under fluoroscopy under direct visualization. His left distal ureter stone was immediately visualized and grasped and removed with a stone basket. It didn't easily, until it met some small amount of resistance at the urethral meatus. It was removed but there is a minor amount of bleeding near the urethral meatus. At this point a second sensor wire was then advanced to level of the left renal pelvis under fluoroscopy with the aid of the ureteroscope. Over one of the sensor wires, a dual-lumen flexible ureteroscope was inserted up to the level of the renal pelvis under fluoroscopy. The guide sensor wire was then removed leaving only the safety sensor wire. Pan nephroscopy revealed the known left lower pole stone. It was somewhat stuck within its left lower pole calyces. Upon using the laser for laser lithotripsy, the stone was very soft. Due to how soft it was, it was dusted into fragments all less than 1 mm the most were much smaller. No large fragments were visualized after the end of lithotripsy. Pan nephroscopy revealed no more fragments. Left retrograde polygrams was obtained. Visualization of the left collecting system as well as to show that there were no large fragments.  The ureteroscope was then removed under direct position so no fragments within the ureter. Over the remaining sensor wire the cystoscope was assembled and a 6 [place] by 26 cm double-J ureteral stent was  placed. Sensor was removed. A curl seen in the renal pelvis under fluoroscopy and in the urinary bladder under direct visualization. This point the patient's bladder was drained. It was however noted that he had small minimal oozing from his prostatic urethra as well as from his meatus. This is likely from the removal of the larger stone. Decision was made to place a 20 [place] Foley catheter for 1 hour to tamponade the minor venous ooze. This catheter was placed and 10 cc were placed in the balloon. The patient was awoken from anesthesia and transferred in stable condition to postanesthesia care unit.   Plan: We will remove the Foley catheter in the PACU after approximately one hour. He'll follow-up in one week for stent removal. He will need a renal ultrasound 1 month after that for an iatrogenic hydronephrosis.  Baruch Gouty, M.D.

## 2017-08-09 NOTE — Progress Notes (Signed)
Pt voided 60cc bloody urine

## 2017-08-09 NOTE — Transfer of Care (Signed)
Immediate Anesthesia Transfer of Care Note  Patient: Todd Rojas  Procedure(s) Performed: Procedure(s): URETEROSCOPY WITH HOLMIUM LASER LITHOTRIPSY/INCISION OF URETEROCELE (Left) CYSTOSCOPY WITH STENT PLACEMENT (Left)  Patient Location: PACU  Anesthesia Type:General  Level of Consciousness: awake, alert  and oriented  Airway & Oxygen Therapy: Patient Spontanous Breathing  Post-op Assessment: Report given to RN and Post -op Vital signs reviewed and stable  Post vital signs: Reviewed and stable  Last Vitals:  Vitals:   08/09/17 1109  BP: (!) 145/64  Pulse: (!) 56  Resp: 18  Temp: (!) 35.6 C  SpO2: 97%    Last Pain:  Vitals:   08/09/17 1109  TempSrc: Tympanic         Complications: No apparent anesthesia complications

## 2017-08-09 NOTE — Interval H&P Note (Signed)
History and Physical Interval Note:  08/09/2017 11:33 AM  Kalman Drape  has presented today for surgery, with the diagnosis of left ureterocele,left nephrolithiasis  The various methods of treatment have been discussed with the patient and family. After consideration of risks, benefits and other options for treatment, the patient has consented to  Procedure(s): URETEROSCOPY WITH HOLMIUM LASER LITHOTRIPSY (Left) CYSTOSCOPY WITH STENT PLACEMENT (Left) as a surgical intervention .  The patient's history has been reviewed, patient examined, no change in status, stable for surgery.  I have reviewed the patient's chart and labs.  Questions were answered to the patient's satisfaction.    RRR Lungs clear  Todd Rojas

## 2017-08-09 NOTE — Progress Notes (Signed)
Pt states pain is better and he wants to go home

## 2017-08-09 NOTE — Progress Notes (Signed)
Slight bloody drainage from penis

## 2017-08-09 NOTE — H&P (View-Only) (Signed)
   07/20/17  CC: No chief complaint on file.   HPI: 76 year old male who is referred for further evaluation and management of a asymptomatic microscopic hematuria. This was done as part of a annual evaluation for the patient. He was told he had microscopic blood in his urine, is never seen blood himself. The patient denies any progressive urinary tract symptoms including frequency, urgency, dysuria. He has not had any worsening nocturia. He denies a history of urinary tract infections or prostatitis. He has no history of kidney stones.  The patient is a nonsmoker, does not smoke for last 30 years. He has no exposure to chemicals or carcinogenic agents. He has no family history of GU malignancy.    The patient recently had labs performed with a BUN/creatinine that was normal-creatinine 1.2. His PSA was 2.7.  CT urogram showed an 8 mm left lower pole nonobstructing renal calculus as well as a left ureterocele with a 10 mm calculus within the distal left ureter. The ureter was dilated secondary to this ureterocele.  There were no vitals taken for this visit. NED. A&Ox3.   No respiratory distress   Abd soft, NT, ND Normal phallus with bilateral descended testicles  Cystoscopy Procedure Note  Patient identification was confirmed, informed consent was obtained, and patient was prepped using Betadine solution.  Lidocaine jelly was administered per urethral meatus.    Preoperative abx where received prior to procedure.     Pre-Procedure: - Inspection reveals a normal caliber ureteral meatus.  Procedure: The flexible cystoscope was introduced without difficulty - No urethral strictures/lesions are present. 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 Mild hypervascularity. 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 Consistent with CT imaging. - Bladder mucosa  reveals no ulcers, tumors, or lesions - No bladder  stones - No trabeculation  Retroflexion shows no intravesical lobe  Post-Procedure: - Patient tolerated the procedure well  Assessment/ Plan:  1. 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 We discussed that this is likely been there for life. However he does have a 10 mm stone in his distal ureter that will likely become symptomatic or cause an issue at some point. Also the lead to renal atrophy on that side if it forms an obstruction. We discussed undergoing a cystoscopy, left ureterocele incision, left ureteroscopy, laser lithotripsy, left ureteral stent placement. We discussed the risks and benefits of this procedure. He understands the risks include but aren't limited to bleeding, infection, iatrogenic injury, reflux of urine, and need for repeat procedures. All questions answered. The patient has elected to proceed.  2. Left nonobstructing 8 mm lower pole stone -Will address at time of the above procedure  3. Microscopic hematuria -negative workup except for above which is likely source.

## 2017-08-09 NOTE — Anesthesia Post-op Follow-up Note (Signed)
Anesthesia QCDR form completed.        

## 2017-08-09 NOTE — Anesthesia Preprocedure Evaluation (Signed)
Anesthesia Evaluation    Airway Mallampati: III  TM Distance: >3 FB     Dental  (+) Chipped, Caps   Pulmonary shortness of breath and with exertion, asthma , COPD,  COPD inhaler, former smoker,    Pulmonary exam normal        Cardiovascular hypertension, Pt. on medications + CAD, + Past MI and +CHF  Normal cardiovascular exam     Neuro/Psych  Neuromuscular disease    GI/Hepatic Neg liver ROS, GERD  Medicated,  Endo/Other  negative endocrine ROS  Renal/GU negative Renal ROS  negative genitourinary   Musculoskeletal  (+) Arthritis , Osteoarthritis,    Abdominal Normal abdominal exam  (+)   Peds negative pediatric ROS (+)  Hematology negative hematology ROS (+)   Anesthesia Other Findings Past Medical History: No date: Asthma 02/13/2012: CAD (coronary artery disease)     Comment:  Overview:   12/2004 - Anginal equivalent symptoms:  Left              arm pain, weakness, and shortness of breath.   12/2004:                Anterior STEMI.  Transferred to [facility].   01/06/2005 -               Cardiac cath:  EF 51%, an occluded mid LAD and a large               OM2 with 75% ostial stenosis.  PCI of the mid occluded               LAD using a Cypher stent.   03/2005 - 2D echo:  EF >55%,               LAE 4.7 cm, no significant valvular disease.   02/2008:                Plavix stopped.   06/2008:  Recurrent angina - symptoms               of chest tightness.  Presented to [facility].  Cardiac catheterization.  The patient               stated nonobstructive CAD seen.  Plavix restarted at that              time.   03/2009 - 2D echo:  EF 35%, akinetic apex and               septum.  Stress echocardiogram deferred for cardiac               catheterization.   03/30/2009:  PCI 90% proximal LAD with              a 3.0 x 18 mm Xience DES.   02/2010: Slow and gradual               return of chest  tightness, fatigue and generalized               weakness concerning for return of anginal equivalent.                Stress test did not show ischemia to the heart muscle               during exercise.  Evidenc No date: CHF (congestive heart failure) ([place]) 07/03/2017: COPD (chronic obstructive pulmonary  disease) ([place]) 02/13/2012: Dyslipidemia     Comment:  Overview:   03/2009  TC 158, TG 88, HDL 39, LDL 102.                11/2013  TC 148, TG 115, HDL 44.3, LDL 80.7 (done at               PCP's office)   10/2014 TC 146  11/2015 TC 123, TG 86,               HDL 41.7, LDL 64  11/2016 TC 140, TG 129, HDL 40.1, LDL               74  No date: Dyspnea No date: GERD (gastroesophageal reflux disease) 03/09/2013: GI disease     Comment:  Overview:   Dysphagia - EGD demonstrated esophageal               stricture which was dilated   GERD   Hemorrhoids  03/09/2013: Hearing loss 07/03/2017: Hyperlipidemia, unspecified 02/13/2012: Hypertension No date: Myocardial infarction ([place]) 12/09/2016: Personal history of gout 05/05/2015: Primary osteoarthritis of both knees 04/14/2016: Right-sided low back pain without sciatica  Reproductive/Obstetrics                             Anesthesia Physical Anesthesia Plan  ASA: III  Anesthesia Plan: General   Post-op Pain Management:    Induction: Intravenous, Rapid sequence and Cricoid pressure planned  PONV Risk Score and Plan:   Airway Management Planned: Oral ETT  Additional Equipment:   Intra-op Plan:   Post-operative Plan: Extubation in OR  Informed Consent: I have reviewed the patients History and Physical, chart, labs and discussed the procedure including the risks, benefits and alternatives for the proposed anesthesia with the patient or authorized representative who has indicated his/her understanding and acceptance.   Dental advisory given  Plan Discussed with: CRNA and Surgeon  Anesthesia Plan Comments:          Anesthesia Quick Evaluation

## 2017-08-09 NOTE — Anesthesia Postprocedure Evaluation (Signed)
Anesthesia Post Note  Patient: Todd Rojas  Procedure(s) Performed: Procedure(s) (LRB): URETEROSCOPY WITH HOLMIUM LASER LITHOTRIPSY/INCISION OF URETEROCELE (Left) CYSTOSCOPY WITH STENT PLACEMENT (Left)  Patient location during evaluation: PACU Anesthesia Type: General Level of consciousness: awake and alert and oriented Pain management: pain level controlled Vital Signs Assessment: post-procedure vital signs reviewed and stable Respiratory status: spontaneous breathing Cardiovascular status: blood pressure returned to baseline Anesthetic complications: no     Last Vitals:  Vitals:   08/09/17 1109 08/09/17 1345  BP: (!) 145/64   Pulse: (!) 56   Resp: 18   Temp: (!) 35.6 C (P) 36.5 C  SpO2: 97%     Last Pain:  Vitals:   08/09/17 1109  TempSrc: Tympanic                 Chiante Peden

## 2017-08-09 NOTE — Progress Notes (Signed)
Pt states burning upon urination

## 2017-08-09 NOTE — Anesthesia Procedure Notes (Signed)
Procedure Name: Intubation Date/Time: 08/09/2017 12:21 PM Performed by: Johnna Acosta Pre-anesthesia Checklist: Patient identified, Emergency Drugs available, Suction available, Patient being monitored and Timeout performed Patient Re-evaluated:Patient Re-evaluated prior to induction Oxygen Delivery Method: Circle system utilized Preoxygenation: Pre-oxygenation with 100% oxygen Induction Type: IV induction Ventilation: Mask ventilation without difficulty and Oral airway inserted - appropriate to patient size Laryngoscope Size: Sabra Heck and 2 Grade View: Grade I Tube type: Oral Tube size: 7.5 mm Number of attempts: 1 Airway Equipment and Method: Stylet Placement Confirmation: ETT inserted through vocal cords under direct vision,  positive ETCO2 and breath sounds checked- equal and bilateral Secured at: 21 cm Tube secured with: Tape Dental Injury: Teeth and Oropharynx as per pre-operative assessment

## 2017-08-10 ENCOUNTER — Encounter: Payer: Self-pay | Admitting: Urology

## 2017-08-10 ENCOUNTER — Ambulatory Visit (INDEPENDENT_AMBULATORY_CARE_PROVIDER_SITE_OTHER): Payer: Medicare Other

## 2017-08-10 VITALS — BP 118/67 | HR 88 | Ht 69.0 in | Wt 198.0 lb

## 2017-08-10 DIAGNOSIS — R339 Retention of urine, unspecified: Secondary | ICD-10-CM | POA: Diagnosis not present

## 2017-08-10 NOTE — Progress Notes (Signed)
Simple Catheter Placement  Patient called stating he is in extreme discomfort unable to urinate after surgery yesterday, passing some blood. Due to urinary retention patient is present today for a foley cath placement.  Patient was cleaned and prepped in a sterile fashion with betadine and lidocaine jelly 2% was instilled into the urethra.  A 20 FR foley catheter was inserted per Dr. Pilar Jarvis- patient post op, urine return was noted  1231ml, urine was dark red in color.  The balloon was filled with 10cc of sterile water. Irrigation was done with catheter in place no blood clots noted. Irrigation of 242ml of saline until clear.  A leg bag was attached for drainage. Patient was also given a night bag to take home and was given instruction on how to change from one bag to another.  Patient was given instruction on proper catheter care.  Patient tolerated well, no complications were noted   Preformed by: Fonnie Jarvis, CMA  Additional notes/ Follow up: Per Dr. Pilar Jarvis patient was scheduled for a voiding trial on nurse schedule for Monday, will keep scheduled stent removal for Thursday  Patient was given 1 box of Myrbetriq 25mg  to help with bladder spasms as needed should stop taking Sunday, per Dr. Pilar Jarvis

## 2017-08-11 ENCOUNTER — Encounter: Payer: Self-pay | Admitting: *Deleted

## 2017-08-11 ENCOUNTER — Emergency Department
Admission: EM | Admit: 2017-08-11 | Discharge: 2017-08-11 | Disposition: A | Discharge status: Home / Self Care | Payer: Medicare Other | Attending: Emergency Medicine | Admitting: Emergency Medicine

## 2017-08-11 DIAGNOSIS — Z7901 Long term (current) use of anticoagulants: Secondary | ICD-10-CM | POA: Diagnosis not present

## 2017-08-11 DIAGNOSIS — Z960 Presence of urogenital implants: Secondary | ICD-10-CM

## 2017-08-11 DIAGNOSIS — Z7902 Long term (current) use of antithrombotics/antiplatelets: Secondary | ICD-10-CM | POA: Insufficient documentation

## 2017-08-11 DIAGNOSIS — I509 Heart failure, unspecified: Secondary | ICD-10-CM | POA: Insufficient documentation

## 2017-08-11 DIAGNOSIS — Z466 Encounter for fitting and adjustment of urinary device: Secondary | ICD-10-CM | POA: Insufficient documentation

## 2017-08-11 DIAGNOSIS — I252 Old myocardial infarction: Secondary | ICD-10-CM | POA: Insufficient documentation

## 2017-08-11 DIAGNOSIS — Z87891 Personal history of nicotine dependence: Secondary | ICD-10-CM | POA: Insufficient documentation

## 2017-08-11 DIAGNOSIS — J45909 Unspecified asthma, uncomplicated: Secondary | ICD-10-CM | POA: Diagnosis not present

## 2017-08-11 DIAGNOSIS — Z96653 Presence of artificial knee joint, bilateral: Secondary | ICD-10-CM | POA: Diagnosis not present

## 2017-08-11 DIAGNOSIS — R339 Retention of urine, unspecified: Secondary | ICD-10-CM | POA: Insufficient documentation

## 2017-08-11 DIAGNOSIS — Z79899 Other long term (current) drug therapy: Secondary | ICD-10-CM | POA: Insufficient documentation

## 2017-08-11 DIAGNOSIS — J449 Chronic obstructive pulmonary disease, unspecified: Secondary | ICD-10-CM | POA: Diagnosis not present

## 2017-08-11 DIAGNOSIS — I110 Hypertensive heart disease with heart failure: Secondary | ICD-10-CM | POA: Diagnosis not present

## 2017-08-11 DIAGNOSIS — Z978 Presence of other specified devices: Secondary | ICD-10-CM

## 2017-08-11 DIAGNOSIS — R300 Dysuria: Secondary | ICD-10-CM | POA: Diagnosis present

## 2017-08-11 DIAGNOSIS — I2510 Atherosclerotic heart disease of native coronary artery without angina pectoris: Secondary | ICD-10-CM | POA: Diagnosis not present

## 2017-08-11 LAB — URINALYSIS, COMPLETE (UACMP) WITH MICROSCOPIC
BACTERIA UA: NONE SEEN
BILIRUBIN URINE: NEGATIVE
Glucose, UA: NEGATIVE mg/dL
KETONES UR: NEGATIVE mg/dL
NITRITE: NEGATIVE
PROTEIN: 100 mg/dL — AB
SPECIFIC GRAVITY, URINE: 1.018 (ref 1.005–1.030)
Squamous Epithelial / LPF: NONE SEEN
pH: 6 (ref 5.0–8.0)

## 2017-08-11 NOTE — ED Provider Notes (Signed)
[facility] Emergency Department Provider Note  ____________________________________________   First MD Initiated Contact with Patient 08/11/17 2131     (approximate)  I have reviewed the triage vital signs and the nursing notes.   HISTORY  Chief Complaint Dysuria    HPI Todd Rojas is a 76 y.o. male With an indwelling Foley catheter who presents for gradually worsening pain in his pelvis and a nondraining Foley.  2 days ago he had a stent placed by Dr. Pilar Jarvis.  Yesterday he went to the clinic because he could not urinate and a Foley catheter was successfully placed with bloody urine output ( verified all of this in the EMR).  Tonight he said that everything was okay earlier today with occasional sharp spasming pain but then gradually the pain got worse and there was no urine output in his leg bag.  In triage the nurse flushed the Foley catheter and immediately bloody urine started to drain.  By the time I saw him he feels completely better and the bladder scan shows 0 mL of urine in the bladder.  The onset was gradual, pain was severe, now resolved.  Flushing the Foley made it better, nothing in particular made it worse.  He denies fever/chills, chest pain, shortness of breath, nausea, and vomiting.   Past Medical History:  Diagnosis Date  . Asthma   . CAD (coronary artery disease) 02/13/2012   Overview:   12/2004 - Anginal equivalent symptoms:  Left arm pain, weakness, and shortness of breath.   12/2004:  Anterior STEMI.  Transferred to [facility]   01/06/2005 - Cardiac cath:  EF 51%, an occluded mid LAD and a large OM2 with 75% ostial stenosis.  PCI of the mid occluded LAD using a Cypher stent.   03/2005 - 2D echo:  EF >55%, LAE 4.7 cm, no significant valvular disease.   02/2008:  Plavix stopped.   06/2008:  Recurrent angina - symptoms of chest tightness.  Presented to [facility].  Cardiac catheterization.  The patient stated  nonobstructive CAD seen.  Plavix restarted at that time.   03/2009 - 2D echo:  EF 35%, akinetic apex and septum.  Stress echocardiogram deferred for cardiac catheterization.   03/30/2009:  PCI 90% proximal LAD with a 3.0 x 18 mm Xience DES.   02/2010: Slow and gradual return of chest tightness, fatigue and generalized weakness concerning for return of anginal equivalent.  Stress test did not show ischemia to the heart muscle during exercise.  Evidenc  . CHF (congestive heart failure) ([place])   . COPD (chronic obstructive pulmonary disease) ([place]) 07/03/2017  . Dyslipidemia 02/13/2012   Overview:   03/2009  TC 158, TG 88, HDL 39, LDL 102.  11/2013  TC 148, TG 115, HDL 44.3, LDL 80.7 (done at PCP's office)   10/2014 TC 146  11/2015 TC 123, TG 86, HDL 41.7, LDL 64  11/2016 TC 140, TG 129, HDL 40.1, LDL 74   . Dyspnea   . GERD (gastroesophageal reflux disease)   . GI disease 03/09/2013   Overview:   Dysphagia - EGD demonstrated esophageal stricture which was dilated   GERD   Hemorrhoids   . Hearing loss 03/09/2013  . Hyperlipidemia, unspecified 07/03/2017  . Hypertension 02/13/2012  . Myocardial infarction ([place])   . Personal history of gout 12/09/2016  . Primary osteoarthritis of both knees 05/05/2015  . Right-sided low back pain without sciatica 04/14/2016    Patient Active Problem List   Diagnosis  Date Noted  . COPD (chronic obstructive pulmonary disease) ([place]) 07/03/2017  . Hyperlipidemia, unspecified 07/03/2017  . Personal history of gout 12/09/2016  . Status post left unicompartmental knee replacement 11/03/2016  . Right-sided low back pain without sciatica 04/14/2016  . Status post left partial knee replacement 12/23/2015  . Right hip pain 07/30/2015  . Status post right partial knee replacement 07/30/2015  . Primary osteoarthritis of both knees 05/05/2015  . 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  . GI disease 03/09/2013  . Hearing loss 03/09/2013  . CAD (coronary artery disease) 02/13/2012  .  Dyslipidemia 02/13/2012  . History of tobacco abuse 02/13/2012  . Hypertension 02/13/2012    Past Surgical History:  Procedure Laterality Date  . coronary stents    . CYSTOSCOPY WITH STENT PLACEMENT Left 08/09/2017   Procedure: CYSTOSCOPY WITH STENT PLACEMENT;  Surgeon: Nickie Retort, MD;  Location: [facility] ORS;  Service: Urology;  Laterality: Left;  Marland Kitchen MEDIAL PARTIAL KNEE REPLACEMENT Bilateral 2016  . URETEROSCOPY WITH HOLMIUM LASER LITHOTRIPSY Left 08/09/2017   Procedure: URETEROSCOPY WITH HOLMIUM LASER LITHOTRIPSY/INCISION OF URETEROCELE;  Surgeon: Nickie Retort, MD;  Location: [facility] ORS;  Service: Urology;  Laterality: Left;    Prior to Admission medications   Medication Sig Start Date End Date Taking? Authorizing Provider  acetaminophen (TYLENOL) 500 MG tablet Take 1,000 mg by mouth every 8 (eight) hours as needed for mild pain.    [provider]  allopurinol (ZYLOPRIM) 300 MG tablet Take 300 mg by mouth daily.  01/31/17   [provider]  cephALEXin (KEFLEX) 500 MG capsule Take 1 capsule (500 mg total) by mouth 3 (three) times daily. 08/09/17   Nickie Retort, MD  cyanocobalamin (,VITAMIN B-12,) 1000 MCG/ML injection Inject 1,000 mcg into the muscle every 30 (thirty) days.  01/02/17   [provider]  HYDROcodone-acetaminophen (NORCO) 5-325 MG tablet Take 1 tablet by mouth every 4 (four) hours as needed for moderate pain. 08/09/17   Nickie Retort, MD  lisinopril (PRINIVIL,ZESTRIL) 40 MG tablet Take 40 mg by mouth daily.  12/09/16 12/09/17  [provider]  pravastatin (PRAVACHOL) 80 MG tablet Take 80 mg by mouth every evening.  06/12/17   [provider]  RIVAROXABAN PO STUDY DRUG - 2.5mg  twice daily    [provider]  UNABLE TO FIND Take 100 mg by mouth daily. Study drug: Aspirin 100 mg  Dr Posey Pronto @@ [facility] is monitoring this study    [provider]  VANISHPOINT TUBERCULIN SYRINGE 25G X 1" 1 ML MISC U UTD  07/05/17   [provider]  vitamin B-12 (CYANOCOBALAMIN) 1000 MCG tablet Take 1,000 mcg by mouth daily.    [provider]    Allergies Patient has no known allergies.  Family History  Problem Relation Age of Onset  . Prostate cancer Neg Hx   . Kidney cancer Neg Hx     Social History Social History  Substance Use Topics  . Smoking status: Former Smoker    Quit date: 07/26/1985  . Smokeless tobacco: Never Used  . Alcohol use No    Review of Systems Constitutional: No fever/chills Eyes: No visual changes. ENT: No sore throat. Cardiovascular: Denies chest pain. Respiratory: Denies shortness of breath. Gastrointestinal: suprapubic pain and distention while the Foley was not draining Genitourinary: indwelling Foley catheter with bloody urine that was not draining Musculoskeletal: Negative for neck pain.  Negative for back pain. Neurological: Negative for headaches, focal weakness or numbness.   ____________________________________________   PHYSICAL  EXAM:  ED Triage Vitals  Enc Vitals Group     BP 08/11/17 2150 (!) 155/81     Pulse Rate 08/11/17 2150 78     Resp 08/11/17 2150 18     Temp 08/11/17 2150 98 F (36.7 C)     Temp Source 08/11/17 2150 Oral     SpO2 08/11/17 2150 96 %     Weight 08/11/17 1715 89.8 kg (198 lb)     Height 08/11/17 1715 1.753 m (5\' 9" )     Head Circumference --      Peak Flow --      Pain Score 08/11/17 1715 4     Pain Loc --      Pain Edu? --      Excl. in [place]? --      Constitutional: Alert and oriented. Well appearing and in no acute distress. Eyes: Conjunctivae are normal.  Cardiovascular: Normal rate, regular rhythm. Good peripheral circulation. Grossly normal heart sounds. Respiratory: Normal respiratory effort.  No retractions. Lungs CTAB. Gastrointestinal: Soft and nontender. No distention.  Genitourinary: indwelling Foley catheter, now draining a large amount of bloody urine ( no clots visualized and it is not  gross blood) Musculoskeletal: No lower extremity tenderness nor edema. No gross deformities of extremities. Neurologic:  Normal speech and language. No gross focal neurologic deficits are appreciated.  Skin:  Skin is warm, dry and intact. No rash noted. Psychiatric: Mood and affect are normal. Speech and behavior are normal.  ____________________________________________   LABS (all labs ordered are listed, but only abnormal results are displayed)  Labs Reviewed  URINALYSIS, COMPLETE (UACMP) WITH MICROSCOPIC - Abnormal; Notable for the following:       Result Value   Color, Urine RED (*)    APPearance CLOUDY (*)    Hgb urine dipstick MODERATE (*)    Protein, ur 100 (*)    Leukocytes, UA TRACE (*)    All other components within normal limits  URINE CULTURE   ____________________________________________  EKG  None - EKG not ordered by ED physician ____________________________________________  RADIOLOGY   No results found.  ____________________________________________   PROCEDURES  Critical Care performed: No   Procedure(s) performed:   Procedures   ____________________________________________   INITIAL IMPRESSION / ASSESSMENT AND PLAN / ED COURSE  Pertinent labs & imaging results that were available during my care of the patient were reviewed by me and considered in my medical decision making (see chart for details).  the nurse in triage flushed his catheter and that fixed the problem.  Although he does have hematuria this is to be expected in the setting of his recent stent.  I counseled him and his wife about drinking plenty of fluids and coming back right away if he develops another blockage, but at this point there is no indication for any other intervention.  The Foley is working well and he has no symptoms.  I gave my usual and customary return precautions.         ____________________________________________  FINAL CLINICAL IMPRESSION(S) / ED  DIAGNOSES  Final diagnoses:  Urinary retention  Foley catheter in place     MEDICATIONS GIVEN DURING THIS VISIT:  Medications - No data to display   NEW OUTPATIENT MEDICATIONS STARTED DURING THIS VISIT:  New Prescriptions   No medications on file    Modified Medications   No medications on file    Discontinued Medications   No medications on file     Note:  This document  was prepared using Systems analyst and may include unintentional dictation errors.    Hinda Kehr, MD 08/11/17 2154

## 2017-08-11 NOTE — Discharge Instructions (Signed)
You were seen in the Emergency Department (ED) for urinary retention in spite of a Foley catheter.  We irrigated your Foley catheter and now it is draining well.  Please read through the included information and follow up with your doctor as recommended in these papers; your doctor will see you in clinic and help you determine when it is time to have the catheter removed.  If you stop producing urine in the bag or if you develop other symptoms that concern you, such as fever, chills, persistent vomiting, or severe abdominal pain, please return immediately to the Emergency Department.

## 2017-08-11 NOTE — ED Triage Notes (Signed)
Pt had stent placed for kidney Wednesday, pt went to urologist yesterday due to being unable to void, foley cathter placed, pt reports bladder spasms with urine coming out of penis and in foley bag, urine is blood tinged

## 2017-08-11 NOTE — ED Notes (Signed)
Bladder scanned in room #3.  Bladder scan showed 0.  Scanned 3 times.  Pt. States he feels comfortable now after irrigation in triage.

## 2017-08-11 NOTE — ED Notes (Signed)
Pt. Going home with wife. 

## 2017-08-11 NOTE — ED Notes (Signed)
200c in bladder with bladder scan, foley irrigated per MD order, no clots, blood tinged urine in return

## 2017-08-11 NOTE — ED Notes (Signed)
Pt. States he is here today due to bladder spasms and urine going over catheter.  Pt. Has no clots in leg bag.

## 2017-08-13 LAB — URINE CULTURE
CULTURE: NO GROWTH
SPECIAL REQUESTS: NORMAL

## 2017-08-14 ENCOUNTER — Ambulatory Visit (INDEPENDENT_AMBULATORY_CARE_PROVIDER_SITE_OTHER): Payer: Medicare Other

## 2017-08-14 VITALS — BP 137/76 | HR 65 | Ht 69.0 in | Wt 202.2 lb

## 2017-08-14 DIAGNOSIS — R339 Retention of urine, unspecified: Secondary | ICD-10-CM | POA: Diagnosis not present

## 2017-08-14 NOTE — Progress Notes (Signed)
Fill and Pull Catheter Removal  Patient is present today for a catheter removal.  Patient was cleaned and prepped in a sterile fashion 135ml of sterile water/ saline was instilled into the bladder when the patient felt the urge to urinate. 25ml of water was then drained from the balloon.  A 16FR foley cath was removed from the bladder no complications were noted .  Patient as then given some time to void on their own.  Patient can void  184ml on their own after some time.  Patient tolerated well.  Preformed by: Toniann Fail, LPN   Follow up/ Additional notes: Pt urine was still bright red. Reinforced with pt to drink plenty of fluids today to flush the blood. Also made pt aware if not able to urinate by 3pm to RTC. Pt voiced understanding of whole conversation.  Blood pressure 137/76, pulse 65, height 5\' 9"  (1.753 m), weight 202 lb 3.2 oz (91.7 kg).

## 2017-08-15 ENCOUNTER — Telehealth: Payer: Self-pay

## 2017-08-15 ENCOUNTER — Encounter: Payer: Self-pay | Admitting: Urology

## 2017-08-15 ENCOUNTER — Ambulatory Visit (INDEPENDENT_AMBULATORY_CARE_PROVIDER_SITE_OTHER): Payer: Medicare Other | Admitting: Urology

## 2017-08-15 VITALS — BP 101/63 | HR 112 | Temp 98.1°F | Ht 69.0 in | Wt 201.2 lb

## 2017-08-15 DIAGNOSIS — Z87898 Personal history of other specified conditions: Secondary | ICD-10-CM | POA: Diagnosis not present

## 2017-08-15 DIAGNOSIS — N451 Epididymitis: Secondary | ICD-10-CM | POA: Diagnosis not present

## 2017-08-15 DIAGNOSIS — N201 Calculus of ureter: Secondary | ICD-10-CM

## 2017-08-15 DIAGNOSIS — N5082 Scrotal pain: Secondary | ICD-10-CM

## 2017-08-15 LAB — URINALYSIS, COMPLETE
BILIRUBIN UA: NEGATIVE
Glucose, UA: NEGATIVE
NITRITE UA: POSITIVE — AB
PH UA: 6 (ref 5.0–7.5)
SPEC GRAV UA: 1.015 (ref 1.005–1.030)
Urobilinogen, Ur: 2 mg/dL — ABNORMAL HIGH (ref 0.2–1.0)

## 2017-08-15 LAB — STONE ANALYSIS
Ca Oxalate,Monohydr.: 97 %
Ca phos cry stone ql IR: 3 %
Stone Weight KSTONE: 499 mg

## 2017-08-15 LAB — MICROSCOPIC EXAMINATION: EPITHELIAL CELLS (NON RENAL): NONE SEEN /HPF (ref 0–10)

## 2017-08-15 LAB — BLADDER SCAN AMB NON-IMAGING: SCAN RESULT: 45

## 2017-08-15 MED ORDER — CEFTRIAXONE SODIUM 1 G IJ SOLR
1.0000 g | Freq: Once | INTRAMUSCULAR | 0 refills | Status: AC
Start: 2017-08-15 — End: 2017-08-15

## 2017-08-15 MED ORDER — SULFAMETHOXAZOLE-TRIMETHOPRIM 800-160 MG PO TABS: 1.0000 | ORAL_TABLET | Freq: Two times a day (BID) | ORAL | 0 refills | Status: DC

## 2017-08-15 NOTE — Telephone Encounter (Signed)
Pt wife called back stating pt temp is 103, pt is vomiting, and having severe lower back pain. Per Larene Beach and Dr. Louis Meckel pt will need rocephin injections until ucx results are back.

## 2017-08-15 NOTE — Progress Notes (Signed)
IM Injection  Patient is present today for an IM Injection for treatment of infection.  Drug: rocephin Dose:1 gram Location: left gluteal muscle Lot: [id number] M Exp:07/2019 Patient tolerated well, no complications were noted  Preformed by: Toniann Fail, LPN   Additional notes/ Follow up: pt will RTC tomorrow for next injection.

## 2017-08-15 NOTE — Addendum Note (Signed)
Addended by: Lestine Box on: 08/15/2017 04:33 PM   Modules accepted: Orders

## 2017-08-15 NOTE — Progress Notes (Signed)
08/15/2017 12:53 PM   Todd Rojas 08/29/1941 [id number]  Referring provider: Tracie Harrier, MD [address] [facility] [place], [place] [address]  Chief Complaint  Patient presents with  . Testicle Pain    HPI: Patient is a 76 year old Caucasian male who presents today requesting an urgent appointment for scrotal pain.   He states that last night he started to experience bilateral scrotal pain.  He denies any injury to the scrotum.    8/10 pain.  Nothing helps the pain.  Nothing makes it worse.  He describes the pain as burning in nature.     He is s/p URS/LL/ureteral stent placement on 08/09/2017 for a left ureteral stone and renal stone associated with an ureterocele.  He is scheduled to have his stent removed this week.  After surgery, he went into urinary retention and a Foley catheter was placed.  He was seen in the ED on 08/11/2017 for a Foley catheter flushing.  He presented yesterday for TOV.  Today, he states he is voiding.  He is having urgency and nocturia.  He has been having low grade fevers and nausea.  His PVR is 45 mL.  His UA is positive for 11-30 WBC's, > 30 RBC's, nitrite positive and many bacteria.     PMH: Past Medical History:  Diagnosis Date  . Asthma   . CAD (coronary artery disease) 02/13/2012   Overview:   12/2004 - Anginal equivalent symptoms:  Left arm pain, weakness, and shortness of breath.   12/2004:  Anterior STEMI.  Transferred to [facility].   01/06/2005 - Cardiac cath:  EF 51%, an occluded mid LAD and a large OM2 with 75% ostial stenosis.  PCI of the mid occluded LAD using a Cypher stent.   03/2005 - 2D echo:  EF >55%, LAE 4.7 cm, no significant valvular disease.   02/2008:  Plavix stopped.   06/2008:  Recurrent angina - symptoms of chest tightness.  Presented to [facility].  Cardiac catheterization.  The patient stated nonobstructive CAD seen.  Plavix restarted at that time.   03/2009 - 2D echo:  EF  35%, akinetic apex and septum.  Stress echocardiogram deferred for cardiac catheterization.   03/30/2009:  PCI 90% proximal LAD with a 3.0 x 18 mm Xience DES.   02/2010: Slow and gradual return of chest tightness, fatigue and generalized weakness concerning for return of anginal equivalent.  Stress test did not show ischemia to the heart muscle during exercise.  Evidenc  . CHF (congestive heart failure) ([place])   . COPD (chronic obstructive pulmonary disease) ([place]) 07/03/2017  . Dyslipidemia 02/13/2012   Overview:   03/2009  TC 158, TG 88, HDL 39, LDL 102.  11/2013  TC 148, TG 115, HDL 44.3, LDL 80.7 (done at PCP's office)   10/2014 TC 146  11/2015 TC 123, TG 86, HDL 41.7, LDL 64  11/2016 TC 140, TG 129, HDL 40.1, LDL 74   . Dyspnea   . GERD (gastroesophageal reflux disease)   . GI disease 03/09/2013   Overview:   Dysphagia - EGD demonstrated esophageal stricture which was dilated   GERD   Hemorrhoids   . Hearing loss 03/09/2013  . Hyperlipidemia, unspecified 07/03/2017  . Hypertension 02/13/2012  . Myocardial infarction ([place])   . Personal history of gout 12/09/2016  . Primary osteoarthritis of both knees 05/05/2015  . Right-sided low back pain without sciatica 04/14/2016    Surgical History: Past Surgical History:  Procedure Laterality Date  . coronary stents    . CYSTOSCOPY WITH STENT PLACEMENT Left 08/09/2017   Procedure: CYSTOSCOPY WITH STENT PLACEMENT;  Surgeon: Nickie Retort, MD;  Location: [facility] ORS;  Service: Urology;  Laterality: Left;  Marland Kitchen MEDIAL PARTIAL KNEE REPLACEMENT Bilateral 2016  . URETEROSCOPY WITH HOLMIUM LASER LITHOTRIPSY Left 08/09/2017   Procedure: URETEROSCOPY WITH HOLMIUM LASER LITHOTRIPSY/INCISION OF URETEROCELE;  Surgeon: Nickie Retort, MD;  Location: [facility] ORS;  Service: Urology;  Laterality: Left;    Home Medications:  Allergies as of 08/15/2017   No Known Allergies     Medication List       Accurate as of 08/15/17 12:53 PM. Always use your most  recent med list.          acetaminophen 500 MG tablet Commonly known as:  TYLENOL Take 1,000 mg by mouth every 8 (eight) hours as needed for mild pain.   allopurinol 300 MG tablet Commonly known as:  ZYLOPRIM Take 300 mg by mouth daily.   cephALEXin 500 MG capsule Commonly known as:  KEFLEX Take 1 capsule (500 mg total) by mouth 3 (three) times daily.   vitamin B-12 1000 MCG tablet Commonly known as:  CYANOCOBALAMIN Take 1,000 mcg by mouth daily.   cyanocobalamin 1000 MCG/ML injection Commonly known as:  (VITAMIN B-12) Inject 1,000 mcg into the muscle every 30 (thirty) days.   HYDROcodone-acetaminophen 5-325 MG tablet Commonly known as:  NORCO Take 1 tablet by mouth every 4 (four) hours as needed for moderate pain.   lisinopril 40 MG tablet Commonly known as:  PRINIVIL,ZESTRIL Take 40 mg by mouth daily.   pravastatin 80 MG tablet Commonly known as:  PRAVACHOL Take 80 mg by mouth every evening.   RIVAROXABAN PO STUDY DRUG - 2.5mg  twice daily   sulfamethoxazole-trimethoprim 800-160 MG tablet Commonly known as:  BACTRIM DS,SEPTRA DS Take 1 tablet by mouth every 12 (twelve) hours.   UNABLE TO FIND Take 100 mg by mouth daily. Study drug: Aspirin 100 mg  Dr Posey Pronto @@ [facility] is monitoring this study   VANISHPOINT TUBERCULIN SYRINGE 25G X 1" 1 ML Misc Generic drug:  Tuberculin-Allergy Syringes U UTD            Discharge Care Instructions        Start     Ordered   08/15/17 0000  Urinalysis, Complete     08/15/17 0912   08/15/17 0000  BLADDER SCAN AMB NON-IMAGING     08/15/17 0912   08/15/17 0000  sulfamethoxazole-trimethoprim (BACTRIM DS,SEPTRA DS) 800-160 MG tablet  Every 12 hours    Question:  Supervising Provider  Answer:  Hollice Espy   08/15/17 1012   08/15/17 0000  Microscopic Examination     08/15/17 0000   08/15/17 0000  CULTURE, URINE COMPREHENSIVE     08/15/17 1252      Allergies: No Known Allergies  Family History: Family History    Problem Relation Age of Onset  . Prostate cancer Neg Hx   . Kidney cancer Neg Hx   . Bladder Cancer Neg Hx     Social History:  reports that he quit smoking about 32 years ago. He has never used smokeless tobacco. He reports that he does not drink alcohol or use drugs.  ROS: UROLOGY Frequent Urination?: No Hard to postpone urination?: Yes Burning/pain with urination?: No Get up at night to urinate?: Yes Leakage of urine?: No Urine stream starts and stops?: No Trouble starting stream?: No Do you have to  strain to urinate?: No Blood in urine?: Yes Urinary tract infection?: No Sexually transmitted disease?: No Injury to kidneys or bladder?: No Painful intercourse?: No Weak stream?: No Erection problems?: No Penile pain?: No  Gastrointestinal Nausea?: Yes Vomiting?: No Indigestion/heartburn?: No Diarrhea?: No Constipation?: No  Constitutional Fever: Yes Night sweats?: Yes Weight loss?: No Fatigue?: No  Skin Skin rash/lesions?: No Itching?: No  Eyes Blurred vision?: No Double vision?: No  Ears/Nose/Throat Sore throat?: No Sinus problems?: No  Hematologic/Lymphatic Swollen glands?: No Easy bruising?: No  Cardiovascular Leg swelling?: No Chest pain?: No  Respiratory Cough?: No Shortness of breath?: No  Endocrine Excessive thirst?: No  Musculoskeletal Back pain?: No Joint pain?: No  Neurological Headaches?: Yes Dizziness?: No  Psychologic Depression?: No Anxiety?: No  Physical Exam: BP 101/63   Pulse (!) 112   Temp 98.1 F (36.7 C) (Oral)   Ht 5\' 9"  (1.753 m)   Wt 201 lb 3.2 oz (91.3 kg)   BMI 29.71 kg/m   Constitutional: Well nourished. Alert and oriented, No acute distress. HEENT: [place] AT, moist mucus membranes. Trachea midline, no masses. Cardiovascular: No clubbing, cyanosis, or edema. Respiratory: Normal respiratory effort, no increased work of breathing. GI: Abdomen is soft, non tender, non distended, no abdominal masses.  Liver and spleen not palpable.  No hernias appreciated.  Stool sample for occult testing is not indicated.   GU: No CVA tenderness.  No bladder fullness or masses.  Patient with uncircumcised phallus.  Foreskin easily retracted.  Urethral meatus is patent.  No penile discharge. No penile lesions or rashes. Scrotum without lesions, cysts, rashes and/or edema.  Testicles are located scrotally bilaterally. No masses are appreciated in the testicles. Left epididymis is normal.  Right epididymis is tender and indurated.   Rectal: Deferred.   Skin: No rashes, bruises or suspicious lesions. Lymph: No cervical or inguinal adenopathy. Neurologic: Grossly intact, no focal deficits, moving all 4 extremities. Psychiatric: Normal mood and affect.  Laboratory Data: Lab Results  Component Value Date   CREATININE 1.20 07/17/2017    Urinalysis 11-30 WBC.  > 30 RBC.  Many bacteria.  Nitrite positive.  See EPIC.    I have reviewed the labs.   Pertinent Imaging: Results for SALEM, LEMBKE (MRN [id number]) as of 08/15/2017 12:50  Ref. Range 08/15/2017 09:12  Scan Result Unknown 45    Assessment & Plan:    1. Scrotal pain  - resulting from epididymitis   2. Epididymitis  - start Septra DS  - urine sent for culture  - Urinalysis, Complete  3. History of urinary retention  - PVR is minimal  - BLADDER SCAN AMB NON-IMAGING  4. S/p ureteroscopy  - patient to return for possible stent removal    Return for keep follow up appointment.  These notes generated with voice recognition software. I apologize for typographical errors.  Zara Council, [facility] [address], [place], [place] [address] [phone]

## 2017-08-16 ENCOUNTER — Encounter: Payer: Self-pay | Admitting: Emergency Medicine

## 2017-08-16 ENCOUNTER — Inpatient Hospital Stay: Payer: Medicare Other

## 2017-08-16 ENCOUNTER — Ambulatory Visit: Payer: Medicare Other

## 2017-08-16 ENCOUNTER — Emergency Department: Payer: Medicare Other

## 2017-08-16 ENCOUNTER — Inpatient Hospital Stay
Admission: EM | Admit: 2017-08-16 | Discharge: 2017-08-19 | DRG: 698 | Disposition: A | Discharge status: Home / Self Care | Payer: Medicare Other | Attending: Internal Medicine | Admitting: Internal Medicine

## 2017-08-16 DIAGNOSIS — K219 Gastro-esophageal reflux disease without esophagitis: Secondary | ICD-10-CM | POA: Diagnosis present

## 2017-08-16 DIAGNOSIS — Z79899 Other long term (current) drug therapy: Secondary | ICD-10-CM

## 2017-08-16 DIAGNOSIS — I110 Hypertensive heart disease with heart failure: Secondary | ICD-10-CM | POA: Diagnosis present

## 2017-08-16 DIAGNOSIS — T83511A Infection and inflammatory reaction due to indwelling urethral catheter, initial encounter: Secondary | ICD-10-CM | POA: Diagnosis present

## 2017-08-16 DIAGNOSIS — T83592A Infection and inflammatory reaction due to indwelling ureteral stent, initial encounter: Secondary | ICD-10-CM | POA: Diagnosis present

## 2017-08-16 DIAGNOSIS — R6520 Severe sepsis without septic shock: Secondary | ICD-10-CM | POA: Diagnosis present

## 2017-08-16 DIAGNOSIS — E860 Dehydration: Secondary | ICD-10-CM | POA: Diagnosis present

## 2017-08-16 DIAGNOSIS — A419 Sepsis, unspecified organism: Secondary | ICD-10-CM | POA: Diagnosis present

## 2017-08-16 DIAGNOSIS — M109 Gout, unspecified: Secondary | ICD-10-CM | POA: Diagnosis present

## 2017-08-16 DIAGNOSIS — I252 Old myocardial infarction: Secondary | ICD-10-CM

## 2017-08-16 DIAGNOSIS — Y732 Prosthetic and other implants, materials and accessory gastroenterology and urology devices associated with adverse incidents: Secondary | ICD-10-CM | POA: Diagnosis present

## 2017-08-16 DIAGNOSIS — E785 Hyperlipidemia, unspecified: Secondary | ICD-10-CM | POA: Diagnosis present

## 2017-08-16 DIAGNOSIS — I2510 Atherosclerotic heart disease of native coronary artery without angina pectoris: Secondary | ICD-10-CM | POA: Diagnosis present

## 2017-08-16 DIAGNOSIS — M170 Bilateral primary osteoarthritis of knee: Secondary | ICD-10-CM | POA: Diagnosis present

## 2017-08-16 DIAGNOSIS — Z87891 Personal history of nicotine dependence: Secondary | ICD-10-CM

## 2017-08-16 DIAGNOSIS — Z87898 Personal history of other specified conditions: Secondary | ICD-10-CM

## 2017-08-16 DIAGNOSIS — N12 Tubulo-interstitial nephritis, not specified as acute or chronic: Secondary | ICD-10-CM | POA: Diagnosis present

## 2017-08-16 DIAGNOSIS — R197 Diarrhea, unspecified: Secondary | ICD-10-CM | POA: Diagnosis present

## 2017-08-16 DIAGNOSIS — I248 Other forms of acute ischemic heart disease: Secondary | ICD-10-CM | POA: Diagnosis present

## 2017-08-16 DIAGNOSIS — Z96653 Presence of artificial knee joint, bilateral: Secondary | ICD-10-CM | POA: Diagnosis present

## 2017-08-16 DIAGNOSIS — N170 Acute kidney failure with tubular necrosis: Secondary | ICD-10-CM | POA: Diagnosis present

## 2017-08-16 DIAGNOSIS — R339 Retention of urine, unspecified: Secondary | ICD-10-CM | POA: Diagnosis present

## 2017-08-16 DIAGNOSIS — I429 Cardiomyopathy, unspecified: Secondary | ICD-10-CM | POA: Diagnosis present

## 2017-08-16 DIAGNOSIS — Z960 Presence of urogenital implants: Secondary | ICD-10-CM | POA: Diagnosis present

## 2017-08-16 DIAGNOSIS — H9190 Unspecified hearing loss, unspecified ear: Secondary | ICD-10-CM | POA: Diagnosis present

## 2017-08-16 DIAGNOSIS — I509 Heart failure, unspecified: Secondary | ICD-10-CM | POA: Diagnosis present

## 2017-08-16 DIAGNOSIS — J449 Chronic obstructive pulmonary disease, unspecified: Secondary | ICD-10-CM | POA: Diagnosis present

## 2017-08-16 DIAGNOSIS — Z955 Presence of coronary angioplasty implant and graft: Secondary | ICD-10-CM

## 2017-08-16 DIAGNOSIS — N179 Acute kidney failure, unspecified: Secondary | ICD-10-CM | POA: Diagnosis not present

## 2017-08-16 DIAGNOSIS — R310 Gross hematuria: Secondary | ICD-10-CM | POA: Diagnosis present

## 2017-08-16 LAB — CBC WITH DIFFERENTIAL/PLATELET
BASOS ABS: 0 10*3/uL (ref 0–0.1)
Basophils Relative: 0 %
EOS ABS: 0 10*3/uL (ref 0–0.7)
EOS PCT: 0 %
HCT: 37 % — ABNORMAL LOW (ref 40.0–52.0)
Hemoglobin: 12.5 g/dL — ABNORMAL LOW (ref 13.0–18.0)
Lymphocytes Relative: 3 %
Lymphs Abs: 0.5 10*3/uL — ABNORMAL LOW (ref 1.0–3.6)
MCH: 33 pg (ref 26.0–34.0)
MCHC: 33.9 g/dL (ref 32.0–36.0)
MCV: 97.4 fL (ref 80.0–100.0)
Monocytes Absolute: 0.8 10*3/uL (ref 0.2–1.0)
Monocytes Relative: 4 %
Neutro Abs: 17.6 10*3/uL — ABNORMAL HIGH (ref 1.4–6.5)
Neutrophils Relative %: 93 %
PLATELETS: 146 10*3/uL — AB (ref 150–440)
RBC: 3.8 MIL/uL — AB (ref 4.40–5.90)
RDW: 13.7 % (ref 11.5–14.5)
WBC: 19 10*3/uL — AB (ref 3.8–10.6)

## 2017-08-16 LAB — COMPREHENSIVE METABOLIC PANEL
ALT: 20 U/L (ref 17–63)
AST: 35 U/L (ref 15–41)
Albumin: 3.1 g/dL — ABNORMAL LOW (ref 3.5–5.0)
Alkaline Phosphatase: 42 U/L (ref 38–126)
Anion gap: 11 (ref 5–15)
BUN: 43 mg/dL — ABNORMAL HIGH (ref 6–20)
CHLORIDE: 100 mmol/L — AB (ref 101–111)
CO2: 22 mmol/L (ref 22–32)
CREATININE: 2.69 mg/dL — AB (ref 0.61–1.24)
Calcium: 8 mg/dL — ABNORMAL LOW (ref 8.9–10.3)
GFR calc non Af Amer: 21 mL/min — ABNORMAL LOW (ref >60)
GFR, EST AFRICAN AMERICAN: 25 mL/min — AB (ref >60)
Glucose, Bld: 182 mg/dL — ABNORMAL HIGH (ref 65–99)
Potassium: 4 mmol/L (ref 3.5–5.1)
SODIUM: 133 mmol/L — AB (ref 135–145)
Total Bilirubin: 0.9 mg/dL (ref 0.3–1.2)
Total Protein: 6.6 g/dL (ref 6.5–8.1)

## 2017-08-16 LAB — URINALYSIS, ROUTINE W REFLEX MICROSCOPIC
Bacteria, UA: NONE SEEN
Bilirubin Urine: NEGATIVE
GLUCOSE, UA: NEGATIVE mg/dL
Ketones, ur: NEGATIVE mg/dL
NITRITE: NEGATIVE
PH: 6 (ref 5.0–8.0)
Protein, ur: 100 mg/dL — AB
SPECIFIC GRAVITY, URINE: 1.019 (ref 1.005–1.030)
Squamous Epithelial / LPF: NONE SEEN

## 2017-08-16 LAB — TROPONIN I: TROPONIN I: 0.17 ng/mL — AB (ref <0.03)

## 2017-08-16 LAB — LACTIC ACID, PLASMA
LACTIC ACID, VENOUS: 2.4 mmol/L — AB (ref 0.5–1.9)
LACTIC ACID, VENOUS: 1.6 mmol/L (ref 0.5–1.9)

## 2017-08-16 MED ORDER — HYDROCODONE-ACETAMINOPHEN 5-325 MG PO TABS: 1.0000 | ORAL_TABLET | ORAL | Status: DC | PRN

## 2017-08-16 MED ORDER — ORAL CARE MOUTH RINSE
15.0000 mL | Freq: Two times a day (BID) | OROMUCOSAL | Status: DC
  Administered 2017-08-16 – 2017-08-18 (×3): 15 mL via OROMUCOSAL

## 2017-08-16 MED ORDER — SODIUM CHLORIDE 0.9 % IV BOLUS (SEPSIS)
1000.0000 mL | Freq: Once | INTRAVENOUS | Status: AC
  Administered 2017-08-16: 1000 mL via INTRAVENOUS

## 2017-08-16 MED ORDER — HEPARIN SODIUM (PORCINE) 5000 UNIT/ML IJ SOLN
5000.0000 [IU] | Freq: Three times a day (TID) | INTRAMUSCULAR | Status: DC
  Administered 2017-08-16 – 2017-08-19 (×8): 5000 [IU] via SUBCUTANEOUS
  Filled 2017-08-16 (×7): qty 1

## 2017-08-16 MED ORDER — ONDANSETRON HCL 4 MG/2ML IJ SOLN
4.0000 mg | Freq: Four times a day (QID) | INTRAMUSCULAR | Status: DC | PRN
  Administered 2017-08-16: 4 mg via INTRAVENOUS
  Filled 2017-08-16: qty 2

## 2017-08-16 MED ORDER — POLYETHYLENE GLYCOL 3350 17 G PO PACK: 17.0000 g | PACK | Freq: Every day | ORAL | Status: DC | PRN

## 2017-08-16 MED ORDER — PIPERACILLIN-TAZOBACTAM 3.375 G IVPB
3.3750 g | Freq: Three times a day (TID) | INTRAVENOUS | Status: DC
  Administered 2017-08-16 – 2017-08-18 (×6): 3.375 g via INTRAVENOUS
  Filled 2017-08-16 (×10): qty 50

## 2017-08-16 MED ORDER — ONDANSETRON HCL 4 MG PO TABS
4.0000 mg | ORAL_TABLET | Freq: Four times a day (QID) | ORAL | Status: DC | PRN
Start: 2017-08-16 — End: 2017-08-19
  Administered 2017-08-17 – 2017-08-18 (×2): 4 mg via ORAL
  Filled 2017-08-16 (×2): qty 1

## 2017-08-16 MED ORDER — TRAMADOL HCL 50 MG PO TABS
50.0000 mg | ORAL_TABLET | Freq: Four times a day (QID) | ORAL | Status: DC | PRN
  Administered 2017-08-16: 50 mg via ORAL
  Filled 2017-08-16: qty 1

## 2017-08-16 MED ORDER — VANCOMYCIN HCL IN DEXTROSE 1-5 GM/200ML-% IV SOLN
1000.0000 mg | Freq: Once | INTRAVENOUS | Status: AC
Start: 2017-08-16 — End: 2017-08-16
  Administered 2017-08-16: 1000 mg via INTRAVENOUS
  Filled 2017-08-16: qty 200

## 2017-08-16 MED ORDER — ACETAMINOPHEN 650 MG RE SUPP: 650.0000 mg | Freq: Four times a day (QID) | RECTAL | Status: DC | PRN

## 2017-08-16 MED ORDER — PRAVASTATIN SODIUM 40 MG PO TABS
80.0000 mg | ORAL_TABLET | Freq: Every evening | ORAL | Status: DC
  Administered 2017-08-16 – 2017-08-18 (×3): 80 mg via ORAL
  Filled 2017-08-16 (×4): qty 2

## 2017-08-16 MED ORDER — PIPERACILLIN-TAZOBACTAM 3.375 G IVPB 30 MIN
INTRAVENOUS | Status: AC
  Filled 2017-08-16: qty 50

## 2017-08-16 MED ORDER — VITAMIN B-12 1000 MCG PO TABS
1000.0000 ug | ORAL_TABLET | Freq: Every day | ORAL | Status: DC
  Administered 2017-08-17 – 2017-08-19 (×3): 1000 ug via ORAL
  Filled 2017-08-16 (×4): qty 1

## 2017-08-16 MED ORDER — SODIUM CHLORIDE 0.9 % IV SOLN
INTRAVENOUS | Status: DC
  Administered 2017-08-16 – 2017-08-18 (×4): via INTRAVENOUS

## 2017-08-16 MED ORDER — CALCIUM CARBONATE ANTACID 500 MG PO CHEW
2.0000 | CHEWABLE_TABLET | Freq: Two times a day (BID) | ORAL | Status: DC
  Administered 2017-08-16 – 2017-08-19 (×4): 400 mg via ORAL
  Filled 2017-08-16 (×5): qty 2

## 2017-08-16 MED ORDER — ALLOPURINOL 300 MG PO TABS
300.0000 mg | ORAL_TABLET | Freq: Every day | ORAL | Status: DC
  Administered 2017-08-17 – 2017-08-19 (×3): 300 mg via ORAL
  Filled 2017-08-16 (×3): qty 1

## 2017-08-16 MED ORDER — PIPERACILLIN-TAZOBACTAM 3.375 G IVPB 30 MIN
3.3750 g | Freq: Once | INTRAVENOUS | Status: AC
  Administered 2017-08-16: 3.375 g via INTRAVENOUS

## 2017-08-16 MED ORDER — VANCOMYCIN HCL 10 G IV SOLR
1250.0000 mg | INTRAVENOUS | Status: DC
  Filled 2017-08-16: qty 1250

## 2017-08-16 MED ORDER — DOCUSATE SODIUM 100 MG PO CAPS
100.0000 mg | ORAL_CAPSULE | Freq: Two times a day (BID) | ORAL | Status: DC
  Administered 2017-08-16 – 2017-08-19 (×2): 100 mg via ORAL
  Filled 2017-08-16 (×2): qty 1

## 2017-08-16 MED ORDER — ACETAMINOPHEN 325 MG PO TABS
650.0000 mg | ORAL_TABLET | Freq: Four times a day (QID) | ORAL | Status: DC | PRN
  Administered 2017-08-16: 650 mg via ORAL
  Filled 2017-08-16: qty 2

## 2017-08-16 NOTE — ED Triage Notes (Addendum)
Patient presents to ED from urology office due to high fevers. Wife reports highest temp at home 103. She gave tylenol at 1100. Ureter sent placed last Wednesday. Patient requesting IV fluids and antibiotics per his urologist.

## 2017-08-16 NOTE — H&P (Addendum)
[facility] [place] NAME: Todd Rojas    MR#:  [id number]  DATE OF BIRTH:  December 12, 1941  DATE OF ADMISSION:  08/16/2017  PRIMARY CARE PHYSICIAN: Tracie Harrier, MD   REQUESTING/REFERRING PHYSICIAN: Dr Vicente Males  CHIEF COMPLAINT:  Fever of 103, not feeling well weakness and vomiting  HISTORY OF PRESENT ILLNESS:  Todd Rojas  is a 75 y.o. 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 Patient also had an episode of vomiting today. Patient has apparently seen urology for asymptomatic microscopic hematuria as part of annual screening workup. He was worked up as outpatient by urology found to have a 10 mm distal ureteral calculus underwent cystoscopy with stent placement on 08/09/2017.the patient developed urinary retention and underwent Foley placement and thereafter did continue to have bloody urine which somewhat faded after taking increased amount of fluids. Came today with fever vomiting not feeling well was found to be hypotensive and somewhat tachycardic in the ER. He is found to have elevated white count of 19,000 with abnormal UA. Patient is being admitted with sepsis secondary to UTI the setting of recent stent placement and Foley catheter placement which was removed on Monday, August 27.  PAST MEDICAL HISTORY:   Past Medical History:  Diagnosis Date  . Asthma   . CAD (coronary artery disease) 02/13/2012   Overview:   12/2004 - Anginal equivalent symptoms:  Left arm pain, weakness, and shortness of breath.   12/2004:  Anterior STEMI.  Transferred to [facility].   01/06/2005 - Cardiac cath:  EF 51%, an occluded mid LAD and a large OM2 with 75% ostial stenosis.  PCI of the mid occluded LAD using a Cypher stent.   03/2005 - 2D echo:  EF >55%, LAE 4.7 cm, no significant valvular disease.   02/2008:   Plavix stopped.   06/2008:  Recurrent angina - symptoms of chest tightness.  Presented to [facility].  Cardiac catheterization.  The patient stated nonobstructive CAD seen.  Plavix restarted at that time.   03/2009 - 2D echo:  EF 35%, akinetic apex and septum.  Stress echocardiogram deferred for cardiac catheterization.   03/30/2009:  PCI 90% proximal LAD with a 3.0 x 18 mm Xience DES.   02/2010: Slow and gradual return of chest tightness, fatigue and generalized weakness concerning for return of anginal equivalent.  Stress test did not show ischemia to the heart muscle during exercise.  Evidenc  . CHF (congestive heart failure) ([place])   . COPD (chronic obstructive pulmonary disease) ([place]) 07/03/2017  . Dyslipidemia 02/13/2012   Overview:   03/2009  TC 158, TG 88, HDL 39, LDL 102.  11/2013  TC 148, TG 115, HDL 44.3, LDL 80.7 (done at PCP's office)   10/2014 TC 146  11/2015 TC 123, TG 86, HDL 41.7, LDL 64  11/2016 TC 140, TG 129, HDL 40.1, LDL 74   . Dyspnea   . GERD (gastroesophageal reflux disease)   . GI disease 03/09/2013   Overview:   Dysphagia - EGD demonstrated esophageal stricture which was dilated   GERD   Hemorrhoids   . Hearing loss 03/09/2013  . Hyperlipidemia, unspecified 07/03/2017  . Hypertension 02/13/2012  . Myocardial infarction ([place])   . Personal history of gout 12/09/2016  . Primary osteoarthritis of both knees 05/05/2015  . Right-sided low back pain  without sciatica 04/14/2016    PAST SURGICAL HISTOIRY:   Past Surgical History:  Procedure Laterality Date  . coronary stents    . CYSTOSCOPY WITH STENT PLACEMENT Left 08/09/2017   Procedure: CYSTOSCOPY WITH STENT PLACEMENT;  Surgeon: Nickie Retort, MD;  Location: [facility] ORS;  Service: Urology;  Laterality: Left;  Marland Kitchen MEDIAL PARTIAL KNEE REPLACEMENT Bilateral 2016  . URETEROSCOPY WITH HOLMIUM LASER LITHOTRIPSY Left 08/09/2017   Procedure: URETEROSCOPY WITH HOLMIUM LASER LITHOTRIPSY/INCISION OF  URETEROCELE;  Surgeon: Nickie Retort, MD;  Location: [facility] ORS;  Service: Urology;  Laterality: Left;    SOCIAL HISTORY:   Social History  Substance Use Topics  . Smoking status: Former Smoker    Quit date: 07/26/1985  . Smokeless tobacco: Never Used  . Alcohol use No    FAMILY HISTORY:   Family History  Problem Relation Age of Onset  . Prostate cancer Neg Hx   . Kidney cancer Neg Hx   . Bladder Cancer Neg Hx     DRUG ALLERGIES:  No Known Allergies  REVIEW OF SYSTEMS:  Review of Systems  Constitutional: Positive for chills, fever and malaise/fatigue. Negative for weight loss.  HENT: Negative for ear discharge, ear pain and nosebleeds.   Eyes: Negative for blurred vision, pain and discharge.  Respiratory: Negative for sputum production, shortness of breath, wheezing and stridor.   Cardiovascular: Negative for chest pain, palpitations, orthopnea and PND.  Gastrointestinal: Positive for nausea and vomiting. Negative for abdominal pain and diarrhea.  Genitourinary: Positive for hematuria and urgency. Negative for frequency.  Musculoskeletal: Negative for back pain and joint pain.  Neurological: Positive for weakness. Negative for sensory change, speech change and focal weakness.  Psychiatric/Behavioral: Negative for depression and hallucinations. The patient is not nervous/anxious.      MEDICATIONS AT HOME:   Prior to Admission medications   Medication Sig Start Date End Date Taking? Authorizing Provider  acetaminophen (TYLENOL) 500 MG tablet Take 1,000 mg by mouth every 8 (eight) hours as needed for mild pain.   Yes [provider]  allopurinol (ZYLOPRIM) 300 MG tablet Take 300 mg by mouth daily.  01/31/17  Yes [provider]  cyanocobalamin (,VITAMIN B-12,) 1000 MCG/ML injection Inject 1,000 mcg into the muscle every 30 (thirty) days.  01/02/17  Yes [provider]  HYDROcodone-acetaminophen (NORCO) 5-325 MG tablet Take 1 tablet by mouth every  4 (four) hours as needed for moderate pain. 08/09/17  Yes Nickie Retort, MD  lisinopril (PRINIVIL,ZESTRIL) 40 MG tablet Take 40 mg by mouth daily.  12/09/16 12/09/17 Yes [provider]  pravastatin (PRAVACHOL) 80 MG tablet Take 80 mg by mouth every evening.  06/12/17  Yes [provider]  RIVAROXABAN PO STUDY DRUG - 2.5mg  twice daily   Yes [provider]  sulfamethoxazole-trimethoprim (BACTRIM DS,SEPTRA DS) 800-160 MG tablet Take 1 tablet by mouth every 12 (twelve) hours. 08/15/17  Yes McGowan, Larene [place] A, PA-C  UNABLE TO FIND Take 100 mg by mouth daily. Study drug: Aspirin 100 mg  Dr Posey Pronto @@ [facility] is monitoring this study   Yes [provider]  vitamin B-12 (CYANOCOBALAMIN) 1000 MCG tablet Take 1,000 mcg by mouth daily.   Yes [provider]  VANISHPOINT TUBERCULIN SYRINGE 25G X 1" 1 ML MISC U UTD 07/05/17   [provider]      VITAL SIGNS:  Blood pressure (!) 92/56, pulse 85, temperature (!) 100.6 F (38.1 C), temperature source Oral, resp. rate (!) 26, height 5\' 9"  (1.753 m),  weight 90.3 kg (199 lb), SpO2 95 %.  PHYSICAL EXAMINATION:  GENERAL:  76 y.o.-year-old patient lying in the bed with no acute distress. Lupita Shutter EYES: Pupils equal, round, reactive to light and accommodation. No scleral icterus. Extraocular muscles intact.  HEENT: Head atraumatic, normocephalic. Oropharynx and nasopharynx clear.  NECK:  Supple, no jugular venous distention. No thyroid enlargement, no tenderness.  LUNGS: Normal breath sounds bilaterally, no wheezing, rales,rhonchi or crepitation. No use of accessory muscles of respiration.  CARDIOVASCULAR: S1, S2 normal. No murmurs, rubs, or gallops.  ABDOMEN: Soft, nontender, nondistended. Bowel sounds present. No organomegaly or mass.  EXTREMITIES: No pedal edema, cyanosis, or clubbing.  NEUROLOGIC: Cranial nerves II through XII are intact. Muscle strength 5/5 in all extremities. Sensation intact. Gait not  checked.  PSYCHIATRIC: The patient is alert and oriented x 3.  SKIN: No obvious rash, lesion, or ulcer.   LABORATORY PANEL:   CBC  Recent Labs Lab 08/16/17 1150  WBC 19.0*  HGB 12.5*  HCT 37.0*  PLT 146*   ------------------------------------------------------------------------------------------------------------------  Chemistries   Recent Labs Lab 08/16/17 1150  NA 133*  K 4.0  CL 100*  CO2 22  GLUCOSE 182*  BUN 43*  CREATININE 2.69*  CALCIUM 8.0*  AST 35  ALT 20  ALKPHOS 42  BILITOT 0.9   ------------------------------------------------------------------------------------------------------------------  Cardiac Enzymes  Recent Labs Lab 08/16/17 1150  TROPONINI 0.17*   ------------------------------------------------------------------------------------------------------------------  RADIOLOGY:  Dg Chest Port 1 View  Result Date: 08/16/2017 CLINICAL DATA:  Initial evaluation for acute sepsis, fever. EXAM: PORTABLE CHEST 1 VIEW COMPARISON:  Prior CT from 08/12/2014. FINDINGS: Accentuation of the cardiac silhouette related to shallow lung inflation and AP technique. Heart size grossly stable and within normal limits. Mediastinal silhouette normal. Lungs are hypoinflated. Small layering right pleural effusion. Bibasilar opacities favored to reflect atelectasis, although superimposed infiltrate not entirely excluded, particularly at the right lung base. Pulmonary vascular congestion with interstitial prominence, suggesting mild pulmonary interstitial edema. No other definite focal infiltrates. No pneumothorax. No acute osseus abnormality.  Degenerative changes of shoulders. IMPRESSION: 1. Pulmonary vascular congestion with interstitial prominence, most suggestive of mild pulmonary interstitial edema. 2. Small right pleural effusion. Associated bibasilar opacities favored to reflect atelectasis, although superimposed infiltrate not entirely excluded, particularly at the  right lung base. Electronically Signed   By: Jeannine Boga M.D.   On: 08/16/2017 12:50    EKG:    IMPRESSION AND PLAN:   Todd Rojas  is a 76 y.o. 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 Patient also had an episode of vomiting today.  1. Sepsis due to UTI with recent left ureteral distal stent placement and ongoing issues with urinary retention and hematuria. -Patient presented at home with fever of 103 fever was 100.6 here along with hypotension and elevated white count and abnormal UA -We'll continue IV Zosyn -IV fluids -Dr. Rayburn Ma to see patient--- she was informed by ER physician -Monitor I's and O's  2. Acute renal failure Suspected ATN from sepsis -Baseline creatinine1.2 in July 2018 -Presents with crit of 2.69. Patient is clinically appears dry. He was also started on Septra as outpatient on August 27 -monitor I's and O's, avoid nephrotoxins, monitor creatinine, consider nephrology consultation if no improvement  3. Hypotension secondary to #1 -Aggressive IV fluids -Patient has history of cardio myopathy. Watch for signs of volume overload. Once blood  pressure stable cut down on IV fluids.  4. Leukocytosis due to sepsis and UTI  5. CAD resume cardiac meds except hold BP meds at present Borderline elevated troponin suspected demand ischemia in the setting of sepsis. Patient is chest pain-free. No acute ST elevation or depression noted on telemetry monitoring -We'll continue cardiac meds. Aspirin will be on hold secondary to hematuria. -Patient denies any chest pain.  6. DVT prophylaxis subcutaneous heparin  Above was discussed with patient and patient's wife was present emergency room.  All the records are reviewed and case discussed with ED provider. Management plans discussed with the patient, family and they are in  agreement.  CODE STATUS: FULL  TOTAL TIME TAKING CARE OF THIS PATIENT: 50 minutes.    Anice Wilshire M.D on 08/16/2017 at 1:34 PM  Between 7am to 6pm - Pager - [phone]  After 6pm go to [email or web address] - password [email or web address] [facility]  [facility]  Office  [phone]  CC: Primary care physician; Tracie Harrier, MD

## 2017-08-16 NOTE — ED Notes (Signed)
Date and time results received: 08/16/17 1410 (use smartphrase ".now" to insert current time)  Test: Lactic acid Critical Value: 2.4  Name of Provider Notified: Dr Deveron Furlong  Orders Received? Or Actions Taken?:

## 2017-08-16 NOTE — Progress Notes (Signed)
Pharmacy Antibiotic Note  Montrez Marietta is a 76 y.o. male admitted on 08/16/2017 with sepsis and UTI.  Pharmacy has been consulted for zosyn dosing. Urosepsis suspected by Dr Posey Pronto due to recent urinary stent placement.   Plan: Zosyn 3.375g IV q8h (4 hour infusion).  Height: 5\' 9"  (175.3 cm) Weight: 199 lb (90.3 kg) IBW/kg (Calculated) : 70.7  Temp (24hrs), Avg:100.6 F (38.1 C), Min:100.6 F (38.1 C), Max:100.6 F (38.1 C)   Recent Labs Lab 08/16/17 1150  WBC 19.0*  CREATININE 2.69*    Estimated Creatinine Clearance: 25.9 mL/min (A) (by C-G formula based on SCr of 2.69 mg/dL (H)).    No Known Allergies  Antimicrobials this admission: 8/29 vancomycin 1gm x 1 8/29 zosyn  >>     Microbiology results: 8/29 BCx: x2 pending  8/29 UCx: pending   Thank you for allowing pharmacy to be a part of this patient's care.  Donna Christen Hawley Michel 08/16/2017 1:29 PM

## 2017-08-16 NOTE — ED Triage Notes (Signed)
First Nurse- Sent from Urology office, states ureter sent placed last Wednesday, was seen in office yesterday for testicular pain and given abx but states fever and vomiting since yesterday

## 2017-08-16 NOTE — ED Provider Notes (Signed)
[facility] Emergency Department Provider Note  Time seen: 12:05 PM  I have reviewed the triage vital signs and the nursing notes.   HISTORY  Chief Complaint Fever    HPI Todd Rojas is a 76 y.o. male with a past medical history of CHF, COPD, gastric reflux, hypertension, hyperlipidemia, history of MI, presents to the emergency department for fever nausea vomiting and weakness. According to the patient approximately one week ago he had a ureteral stent placed for a kidney stone. He states since yesterday he has been having fever to 103 nauseated and vomiting. Saw his urologist yesterday who gave him a shot of antibiotics. Patient was supposed to return to the urologist today but was feeling extremely weak and had been vomiting all night so they came to the emergency department for evaluation. Upon arrival the patient is febrile to 100.6 tachycardic and quite diaphoretic. Denies any abdominal pain states moderate burning with urination.  Past Medical History:  Diagnosis Date  . Asthma   . CAD (coronary artery disease) 02/13/2012   Overview:   12/2004 - Anginal equivalent symptoms:  Left arm pain, weakness, and shortness of breath.   12/2004:  Anterior STEMI.  Transferred to [facility].   01/06/2005 - Cardiac cath:  EF 51%, an occluded mid LAD and a large OM2 with 75% ostial stenosis.  PCI of the mid occluded LAD using a Cypher stent.   03/2005 - 2D echo:  EF >55%, LAE 4.7 cm, no significant valvular disease.   02/2008:  Plavix stopped.   06/2008:  Recurrent angina - symptoms of chest tightness.  Presented to [facility]  Cardiac catheterization.  The patient stated nonobstructive CAD seen.  Plavix restarted at that time.   03/2009 - 2D echo:  EF 35%, akinetic apex and septum.  Stress echocardiogram deferred for cardiac catheterization.   03/30/2009:  PCI 90% proximal LAD with a 3.0 x 18 mm Xience DES.   02/2010: Slow and gradual return of chest  tightness, fatigue and generalized weakness concerning for return of anginal equivalent.  Stress test did not show ischemia to the heart muscle during exercise.  Evidenc  . CHF (congestive heart failure) ([place])   . COPD (chronic obstructive pulmonary disease) ([place]) 07/03/2017  . Dyslipidemia 02/13/2012   Overview:   03/2009  TC 158, TG 88, HDL 39, LDL 102.  11/2013  TC 148, TG 115, HDL 44.3, LDL 80.7 (done at PCP's office)   10/2014 TC 146  11/2015 TC 123, TG 86, HDL 41.7, LDL 64  11/2016 TC 140, TG 129, HDL 40.1, LDL 74   . Dyspnea   . GERD (gastroesophageal reflux disease)   . GI disease 03/09/2013   Overview:   Dysphagia - EGD demonstrated esophageal stricture which was dilated   GERD   Hemorrhoids   . Hearing loss 03/09/2013  . Hyperlipidemia, unspecified 07/03/2017  . Hypertension 02/13/2012  . Myocardial infarction ([place])   . Personal history of gout 12/09/2016  . Primary osteoarthritis of both knees 05/05/2015  . Right-sided low back pain without sciatica 04/14/2016    Patient Active Problem List   Diagnosis Date Noted  . COPD (chronic obstructive pulmonary disease) ([place]) 07/03/2017  . Hyperlipidemia, unspecified 07/03/2017  . Personal history of gout 12/09/2016  . Status post left unicompartmental knee replacement 11/03/2016  . Right-sided low back pain without sciatica 04/14/2016  . Status post left partial knee replacement 12/23/2015  . Right hip pain 07/30/2015  . Status post right partial  knee replacement 07/30/2015  . Primary osteoarthritis of both knees 05/05/2015  . 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  . GI disease 03/09/2013  . Hearing loss 03/09/2013  . CAD (coronary artery disease) 02/13/2012  . Dyslipidemia 02/13/2012  . History of tobacco abuse 02/13/2012  . Hypertension 02/13/2012    Past Surgical History:  Procedure Laterality Date  . coronary stents    . CYSTOSCOPY WITH STENT PLACEMENT Left 08/09/2017   Procedure: CYSTOSCOPY WITH STENT PLACEMENT;  Surgeon: Nickie Retort, MD;  Location: [facility] ORS;  Service: Urology;  Laterality: Left;  Marland Kitchen MEDIAL PARTIAL KNEE REPLACEMENT Bilateral 2016  . URETEROSCOPY WITH HOLMIUM LASER LITHOTRIPSY Left 08/09/2017   Procedure: URETEROSCOPY WITH HOLMIUM LASER LITHOTRIPSY/INCISION OF URETEROCELE;  Surgeon: Nickie Retort, MD;  Location: [facility] ORS;  Service: Urology;  Laterality: Left;    Prior to Admission medications   Medication Sig Start Date End Date Taking? Authorizing Provider  acetaminophen (TYLENOL) 500 MG tablet Take 1,000 mg by mouth every 8 (eight) hours as needed for mild pain.    [provider]  allopurinol (ZYLOPRIM) 300 MG tablet Take 300 mg by mouth daily.  01/31/17   [provider]  cephALEXin (KEFLEX) 500 MG capsule Take 1 capsule (500 mg total) by mouth 3 (three) times daily. Patient not taking: Reported on 08/15/2017 08/09/17   Nickie Retort, MD  cyanocobalamin (,VITAMIN B-12,) 1000 MCG/ML injection Inject 1,000 mcg into the muscle every 30 (thirty) days.  01/02/17   [provider]  HYDROcodone-acetaminophen (NORCO) 5-325 MG tablet Take 1 tablet by mouth every 4 (four) hours as needed for moderate pain. 08/09/17   Nickie Retort, MD  lisinopril (PRINIVIL,ZESTRIL) 40 MG tablet Take 40 mg by mouth daily.  12/09/16 12/09/17  [provider]  pravastatin (PRAVACHOL) 80 MG tablet Take 80 mg by mouth every evening.  06/12/17   [provider]  RIVAROXABAN PO STUDY DRUG - 2.5mg  twice daily    [provider]  sulfamethoxazole-trimethoprim (BACTRIM DS,SEPTRA DS) 800-160 MG tablet Take 1 tablet by mouth every 12 (twelve) hours. 08/15/17   Zara Council A, PA-C  UNABLE TO FIND Take 100 mg by mouth daily. Study drug: Aspirin 100 mg  Dr Posey Pronto @@ [facility] is monitoring this study    [provider]  VANISHPOINT TUBERCULIN SYRINGE 25G X 1" 1 ML MISC U UTD 07/05/17   [provider]  vitamin B-12 (CYANOCOBALAMIN) 1000 MCG tablet Take  1,000 mcg by mouth daily.    [provider]    No Known Allergies  Family History  Problem Relation Age of Onset  . Prostate cancer Neg Hx   . Kidney cancer Neg Hx   . Bladder Cancer Neg Hx     Social History Social History  Substance Use Topics  . Smoking status: Former Smoker    Quit date: 07/26/1985  . Smokeless tobacco: Never Used  . Alcohol use No    Review of Systems Constitutional: Positive for fever to 103 ENT: Negative for congestion Cardiovascular: Negative for chest pain. Respiratory: Negative for shortness of breath. Gastrointestinal: Negative for abdominal pain. Positive for nausea and vomiting. Genitourinary: Moderate dysuria. Musculoskeletal: Negative for back pain. Neurological: Negative for headache All other ROS negative  ____________________________________________   PHYSICAL EXAM:  VITAL SIGNS: ED Triage Vitals  Enc Vitals Group     BP 08/16/17 1140 101/87     Pulse Rate 08/16/17 1140 (!) 114     Resp 08/16/17 1140 18     Temp  08/16/17 1140 (!) 100.6 F (38.1 C)     Temp Source 08/16/17 1140 Oral     SpO2 08/16/17 1140 93 %     Weight 08/16/17 1140 199 lb (90.3 kg)     Height 08/16/17 1140 5\' 9"  (1.753 m)     Head Circumference --      Peak Flow --      Pain Score 08/16/17 1142 8     Pain Loc --      Pain Edu? --      Excl. in [place]? --     Constitutional: Alert and oriented. Diaphoretic. Eyes: Normal exam ENT   Head: Normocephalic and atraumatic.   Mouth/Throat: Mucous membranes are moist. Cardiovascular: Regular rhythm, rate around 110 bpm. No obvious murmur. Respiratory: Normal respiratory effort without tachypnea nor retractions. Breath sounds are clear  Gastrointestinal: Soft and nontender. No distention.   Musculoskeletal: Nontender with normal range of motion in all extremities.  Neurologic:  Normal speech and language. No gross focal neurologic deficits  Skin:  Skin is warm, dry and intact.  Psychiatric: Mood  and affect are normal.   ____________________________________________   RADIOLOGY  IMPRESSION: 1. Pulmonary vascular congestion with interstitial prominence, most suggestive of mild pulmonary interstitial edema. 2. Small right pleural effusion. Associated bibasilar opacities favored to reflect atelectasis, although superimposed infiltrate not entirely excluded, particularly at the right lung base.  ____________________________________________   INITIAL IMPRESSION / ASSESSMENT AND PLAN / ED COURSE  Pertinent labs & imaging results that were available during my care of the patient were reviewed by me and considered in my medical decision making (see chart for details).  Patient present in the emergency department one week after ureteral stent was placed with fever and tachycardia. Patient is hypotensive with a systolic in the 73S currently. His protocols initiated we will IV hydrate, start empiric antibiotics. We will check labs, patient will require admission to the hospital with urology consultation.  Patient's labs have resulted showing a significant leukocytosis of 19,000 with acute renal failure 2.6 creatinine of a baseline of 1.2 also troponin is elevated. I discussed the patient with urology, Dr. Erlene Quan will see the patient today. We are covering with broad-spectrum antibiotics for sepsis. Patient will be admitted to the hospitalist service with urology consultation.   CRITICAL CARE Performed by: Harvest Dark   Total critical care time: 30 minutes  Critical care time was exclusive of separately billable procedures and treating other patients.  Critical care was necessary to treat or prevent imminent or life-threatening deterioration.  Critical care was time spent personally by me on the following activities: development of treatment plan with patient and/or surrogate as well as nursing, discussions with consultants, evaluation of patient's response to treatment,  examination of patient, obtaining history from patient or surrogate, ordering and performing treatments and interventions, ordering and review of laboratory studies, ordering and review of radiographic studies, pulse oximetry and re-evaluation of patient's condition.   ____________________________________________   FINAL CLINICAL IMPRESSION(S) / ED DIAGNOSES  Sepsis    Harvest Dark, MD 08/16/17 1322

## 2017-08-16 NOTE — Progress Notes (Signed)
Pt presented today to receive 2nd rocephin injection. Upon arrival to the clinic pt was actively vomiting. Per pt wife pt temp at home before coming to appt was 103 and 2 tylenol were given to pt. Wife also stated that pt had been vomiting all night and continuing to run a fever 102-103 with tylenol. Per Larene Beach pt was sent to the ER for IV intervention. Wife and volunteer took pt to ER via [facility] wheelchair.

## 2017-08-16 NOTE — Consult Note (Signed)
Urology Consult  I have been asked to see the patient by Dr. Kerman Passey, for evaluation and management of fevers s/p ureteroscopy.  Chief Complaint: Fevers, weakness  History of Present Illness: Todd Rojas is a 76 y.o. year old male multiple medical comorbidities status post ureteroscopy, laser lithotripsy, ureteral stent placement of Foley catheter on 08/09/2017 by Dr. Pilar Jarvis admitted today with sepsis related to urinary source.  Patient initially underwent endoscopic hematuria workup was found to have a stone in his left distal ureter with associated ureterocele. Following the procedure, Foley catheter was placed. He was seen subsequently ED on 08/11/2017 requiring Foley catheter flushing. This was subsequently removed on Monday. He presented to our clinic yesterday with low-grade fevers, nausea, and overall feeling unwell. He received IM ceftriaxone and urine culture was suspicious and is currently pending.  Today, upon presentation to our office, he was actively vomiting, fever to 103 and was sent to the emergency room for further evaluation.  He was noted to have an elevated lactate of 2.4, leukocytosis of 19, positive UA, elevated creatinine 22.69 from his previous baseline of 1.2, mildly elevated troponin of 0.17. Antibiotics broadended vancomycin/Zosyn.  He continues to feel unwell. He complains of mild shortness of breath, fevers, and weakness. He's been voiding well without a catheter. His postvoid residual was minimal yesterday in the office. No clots.  KUB confirms stent in good position.  Past Medical History:  Diagnosis Date  . Asthma   . CAD (coronary artery disease) 02/13/2012   Overview:   12/2004 - Anginal equivalent symptoms:  Left arm pain, weakness, and shortness of breath.   12/2004:  Anterior STEMI.  Transferred to [facility].   01/06/2005 - Cardiac cath:  EF 51%, an occluded mid LAD and a large OM2 with 75% ostial stenosis.  PCI of the mid occluded LAD using a  Cypher stent.   03/2005 - 2D echo:  EF >55%, LAE 4.7 cm, no significant valvular disease.   02/2008:  Plavix stopped.   06/2008:  Recurrent angina - symptoms of chest tightness.  Presented to [facility].  Cardiac catheterization.  The patient stated nonobstructive CAD seen.  Plavix restarted at that time.   03/2009 - 2D echo:  EF 35%, akinetic apex and septum.  Stress echocardiogram deferred for cardiac catheterization.   03/30/2009:  PCI 90% proximal LAD with a 3.0 x 18 mm Xience DES.   02/2010: Slow and gradual return of chest tightness, fatigue and generalized weakness concerning for return of anginal equivalent.  Stress test did not show ischemia to the heart muscle during exercise.  [place]enc  . CHF (congestive heart failure) ([place])   . COPD (chronic obstructive pulmonary disease) ([place]) 07/03/2017  . Dyslipidemia 02/13/2012   Overview:   03/2009  TC 158, TG 88, HDL 39, LDL 102.  11/2013  TC 148, TG 115, HDL 44.3, LDL 80.7 (done at PCP's office)   10/2014 TC 146  11/2015 TC 123, TG 86, HDL 41.7, LDL 64  11/2016 TC 140, TG 129, HDL 40.1, LDL 74   . Dyspnea   . GERD (gastroesophageal reflux disease)   . GI disease 03/09/2013   Overview:   Dysphagia - EGD demonstrated esophageal stricture which was dilated   GERD   Hemorrhoids   . Hearing loss 03/09/2013  . Hyperlipidemia, unspecified 07/03/2017  . Hypertension 02/13/2012  . Myocardial infarction ([place])   . Personal history of gout 12/09/2016  . Primary osteoarthritis of both knees 05/05/2015  .  Right-sided low back pain without sciatica 04/14/2016    Past Surgical History:  Procedure Laterality Date  . coronary stents    . CYSTOSCOPY WITH STENT PLACEMENT Left 08/09/2017   Procedure: CYSTOSCOPY WITH STENT PLACEMENT;  Surgeon: Nickie Retort, MD;  Location: [facility] ORS;  Service: Urology;  Laterality: Left;  Marland Kitchen MEDIAL PARTIAL KNEE REPLACEMENT Bilateral 2016  . URETEROSCOPY WITH HOLMIUM LASER LITHOTRIPSY Left 08/09/2017    Procedure: URETEROSCOPY WITH HOLMIUM LASER LITHOTRIPSY/INCISION OF URETEROCELE;  Surgeon: Nickie Retort, MD;  Location: [facility] ORS;  Service: Urology;  Laterality: Left;    Home Medications:  Current Meds  Medication Sig  . acetaminophen (TYLENOL) 500 MG tablet Take 1,000 mg by mouth every 8 (eight) hours as needed for mild pain.  Marland Kitchen allopurinol (ZYLOPRIM) 300 MG tablet Take 300 mg by mouth daily.   . cyanocobalamin (,VITAMIN B-12,) 1000 MCG/ML injection Inject 1,000 mcg into the muscle every 30 (thirty) days.   Marland Kitchen HYDROcodone-acetaminophen (NORCO) 5-325 MG tablet Take 1 tablet by mouth every 4 (four) hours as needed for moderate pain.  Marland Kitchen lisinopril (PRINIVIL,ZESTRIL) 40 MG tablet Take 40 mg by mouth daily.   . pravastatin (PRAVACHOL) 80 MG tablet Take 80 mg by mouth every evening.   Marland Kitchen RIVAROXABAN PO STUDY DRUG - 2.5mg  twice daily  . sulfamethoxazole-trimethoprim (BACTRIM DS,SEPTRA DS) 800-160 MG tablet Take 1 tablet by mouth every 12 (twelve) hours.  Marland Kitchen UNABLE TO FIND Take 100 mg by mouth daily. Study drug: Aspirin 100 mg  Dr Posey Pronto @@ [facility] is monitoring this study  . vitamin B-12 (CYANOCOBALAMIN) 1000 MCG tablet Take 1,000 mcg by mouth daily.    Allergies: No Known Allergies  Family History  Problem Relation Age of Onset  . Prostate cancer Neg Hx   . Kidney cancer Neg Hx   . Bladder Cancer Neg Hx     Social History:  reports that he quit smoking about 32 years ago. He has never used smokeless tobacco. He reports that he does not drink alcohol or use drugs.  ROS: A complete review of systems was performed.  All systems are negative except for pertinent findings as noted.  Physical Exam:  Vital signs in last 24 hours: Temp:  [98.2 F (36.8 C)-100.6 F (38.1 C)] 98.2 F (36.8 C) (08/29 1536) Pulse Rate:  [84-114] 104 (08/29 1536) Resp:  [18-32] 22 (08/29 1536) BP: (79-155)/(47-87) 155/64 (08/29 1536) SpO2:  [92 %-96 %] 92 % (08/29 1536) Weight:  [199 lb (90.3 kg)] 199 lb  (90.3 kg) (08/29 1140) Constitutional:  Alert and oriented, mild distress..  Wife and 2 daughters at bedside. HEENT: [place] AT, moist mucus membranes.  Trachea midline, no masses Cardiovascular: Regular rate and rhythm, no clubbing, cyanosis, or edema. Respiratory: Slightly increased respiratory rate, wearing nasal cannula. GI: Abdomen is soft, nontender, nondistended, no abdominal masses GU: No CVA tenderness Skin: No rashes, bruises or suspicious lesions Neurologic: Grossly intact, no focal deficits, moving all 4 extremities Psychiatric: Normal mood and affect  Laboratory Data:   Recent Labs  08/16/17 1150  WBC 19.0*  HGB 12.5*  HCT 37.0*    Recent Labs  08/16/17 1150  NA 133*  K 4.0  CL 100*  CO2 22  GLUCOSE 182*  BUN 43*  CREATININE 2.69*  CALCIUM 8.0*   No results for input(s): LABPT, INR in the last 72 hours. No results for input(s): LABURIN in the last 72 hours. Results for orders placed or performed in visit on 08/15/17  Microscopic Examination  Status: Abnormal   Collection Time: 08/15/17  9:12 AM  Result Value Ref Range Status   WBC, UA 11-30 (A) 0 - 5 /hpf Final   RBC, UA >30 (H) 0 - 2 /hpf Final   Epithelial Cells (non renal) None seen 0 - 10 /hpf Final   Casts Present (A) None seen /lpf Final   Cast Type Hyaline casts N/A Final   Mucus, UA Present (A) Not Estab. Final   Bacteria, UA Many (A) None seen/Few Final     Radiologic Imaging: Dg Abdomen 1 View  Result Date: 08/16/2017 CLINICAL DATA:  Recent stent placement.  Sepsis EXAM: ABDOMEN - 1 VIEW COMPARISON:  07/17/2017 FINDINGS: Left-sided nephroureteral is identified. The previously noted left renal calculus no longer visualized nonobstructive bowel gas pattern. IMPRESSION: Status post left ureteral stenting with resolution of previous left renal calculus. Electronically Signed   By: Kerby Moors M.D.   On: 08/16/2017 13:56   Dg Chest Port 1 View  Result Date: 08/16/2017 CLINICAL DATA:  Initial  evaluation for acute sepsis, fever. EXAM: PORTABLE CHEST 1 VIEW COMPARISON:  Prior CT from 08/12/2014. FINDINGS: Accentuation of the cardiac silhouette related to shallow lung inflation and AP technique. Heart size grossly stable and within normal limits. Mediastinal silhouette normal. Lungs are hypoinflated. Small layering right pleural effusion. Bibasilar opacities favored to reflect atelectasis, although superimposed infiltrate not entirely excluded, particularly at the right lung base. Pulmonary vascular congestion with interstitial prominence, suggesting mild pulmonary interstitial edema. No other definite focal infiltrates. No pneumothorax. No acute osseus abnormality.  Degenerative changes of shoulders. IMPRESSION: 1. Pulmonary vascular congestion with interstitial prominence, most suggestive of mild pulmonary interstitial edema. 2. Small right pleural effusion. Associated bibasilar opacities favored to reflect atelectasis, although superimposed infiltrate not entirely excluded, particularly at the right lung base. Electronically Signed   By: Jeannine Boga M.D.   On: 08/16/2017 12:50   KUB personally reviewed.  Impression/Plan: 76 year old male admitted with sepsis secondary to urinary source following ureteroscopy. Stent in excellent position on KUB  1) UTI/ presumed pyelonephritis-  Broad-spectrum antibiotics Urine and blood cultures pending including from 8/28 and 8/29 and adjust as needed Reimaging in 24 hours in no improvement   2) Acute kidney injury-  Likely ATN related to sepsis If creatinine fails to improve tomorrow with IV fluids, recommend renal ultrasound to rule out ongoing hydronephrosis although unlikely given stent in good position on KUB  3) History of urinary retention- Adequate bladder emptying and straining the office Bladder scan (PVR) today to ensure adequate emptying, place Foley greater than 200 cc  4) Hematuria- Related to stent/ UTI Please continue ASA  due to history of CAD, demand ischemia- hemoglobin at baseline  We will continue to follow this patient  08/16/2017, 7:01 PM  Hollice Espy,  MD

## 2017-08-16 NOTE — ED Notes (Signed)
Date and time results received: 08/16/17 1309 (use smartphrase ".now" to insert current time)  Test: Troponin Critical Value: 0.17  Name of Provider Notified: Dr Celesta Gentile  Orders Received? Or Actions Taken?:

## 2017-08-17 ENCOUNTER — Other Ambulatory Visit: Payer: Medicare Other | Admitting: Urology

## 2017-08-17 DIAGNOSIS — R310 Gross hematuria: Secondary | ICD-10-CM

## 2017-08-17 DIAGNOSIS — N12 Tubulo-interstitial nephritis, not specified as acute or chronic: Secondary | ICD-10-CM

## 2017-08-17 DIAGNOSIS — N179 Acute kidney failure, unspecified: Secondary | ICD-10-CM

## 2017-08-17 LAB — C DIFFICILE QUICK SCREEN W PCR REFLEX
C Diff antigen: NEGATIVE
C Diff interpretation: NOT DETECTED
C Diff toxin: NEGATIVE

## 2017-08-17 LAB — CBC
HEMATOCRIT: 33.5 % — AB (ref 40.0–52.0)
HEMOGLOBIN: 11.3 g/dL — AB (ref 13.0–18.0)
MCH: 32.9 pg (ref 26.0–34.0)
MCHC: 33.6 g/dL (ref 32.0–36.0)
MCV: 97.7 fL (ref 80.0–100.0)
Platelets: 120 10*3/uL — ABNORMAL LOW (ref 150–440)
RBC: 3.43 MIL/uL — ABNORMAL LOW (ref 4.40–5.90)
RDW: 14 % (ref 11.5–14.5)
WBC: 13.2 10*3/uL — AB (ref 3.8–10.6)

## 2017-08-17 LAB — BASIC METABOLIC PANEL
ANION GAP: 7 (ref 5–15)
BUN: 34 mg/dL — AB (ref 6–20)
CALCIUM: 7.6 mg/dL — AB (ref 8.9–10.3)
CO2: 23 mmol/L (ref 22–32)
Chloride: 105 mmol/L (ref 101–111)
Creatinine, Ser: 2.03 mg/dL — ABNORMAL HIGH (ref 0.61–1.24)
GFR calc Af Amer: 35 mL/min — ABNORMAL LOW (ref >60)
GFR, EST NON AFRICAN AMERICAN: 30 mL/min — AB (ref >60)
GLUCOSE: 116 mg/dL — AB (ref 65–99)
POTASSIUM: 3.9 mmol/L (ref 3.5–5.1)
SODIUM: 135 mmol/L (ref 135–145)

## 2017-08-17 MED ORDER — ASPIRIN 81 MG PO CHEW
81.0000 mg | CHEWABLE_TABLET | Freq: Every day | ORAL | Status: DC
  Administered 2017-08-17 – 2017-08-19 (×3): 81 mg via ORAL
  Filled 2017-08-17 (×3): qty 1

## 2017-08-17 MED ORDER — BISMUTH SUBSALICYLATE 262 MG/15ML PO SUSP
30.0000 mL | Freq: Four times a day (QID) | ORAL | Status: DC | PRN
  Administered 2017-08-17 – 2017-08-19 (×5): 30 mL via ORAL
  Filled 2017-08-17 (×3): qty 118

## 2017-08-17 NOTE — Progress Notes (Addendum)
[facility] NAME: Ronzell Laban    MR#:  [id number]  DATE OF BIRTH:  July 25, 1941  SUBJECTIVE:  CHIEF COMPLAINT:   Chief Complaint  Patient presents with  . Fever   No fever or flank pain. Hematuria is better. Diarrhea with loose stool twice today. REVIEW OF SYSTEMS:  Review of Systems  Constitutional: Negative for chills, fever and malaise/fatigue.  HENT: Negative for sore throat.   Eyes: Negative for blurred vision and double vision.  Respiratory: Negative for cough, hemoptysis, shortness of breath, wheezing and stridor.   Cardiovascular: Negative for chest pain, palpitations, orthopnea and leg swelling.  Gastrointestinal: Positive for diarrhea. Negative for abdominal pain, blood in stool, melena, nausea and vomiting.  Genitourinary: Positive for hematuria. Negative for dysuria and flank pain.  Musculoskeletal: Negative for back pain and joint pain.  Skin: Negative for rash.  Neurological: Negative for dizziness, sensory change, focal weakness, seizures, loss of consciousness, weakness and headaches.  Endo/Heme/Allergies: Negative for polydipsia.  Psychiatric/Behavioral: Negative for depression. The patient is not nervous/anxious.     DRUG ALLERGIES:  No Known Allergies VITALS:  Blood pressure (!) 106/58, pulse 77, temperature 98.1 F (36.7 C), temperature source Oral, resp. rate 18, height 5\' 9"  (1.753 m), weight 199 lb (90.3 kg), SpO2 93 %. PHYSICAL EXAMINATION:  Physical Exam  Constitutional: He is oriented to person, place, and time and well-developed, well-nourished, and in no distress.  HENT:  Head: Normocephalic.  Mouth/Throat: Oropharynx is clear and moist.  Eyes: Pupils are equal, round, and reactive to light. Conjunctivae and EOM are normal. No scleral icterus.  Neck: Normal range of motion. Neck supple. No JVD present. No tracheal deviation present.  Cardiovascular: Normal rate, regular rhythm and normal  heart sounds.  Exam reveals no gallop.   No murmur heard. Pulmonary/Chest: Effort normal and breath sounds normal. No respiratory distress. He has no wheezes. He has no rales.  Abdominal: Soft. Bowel sounds are normal. He exhibits no distension. There is no tenderness. There is no rebound.  Musculoskeletal: Normal range of motion. He exhibits no edema or tenderness.  Neurological: He is alert and oriented to person, place, and time. No cranial nerve deficit.  Skin: No rash noted. No erythema.  Psychiatric: Affect normal.   LABORATORY PANEL:  Male CBC  Recent Labs Lab 08/17/17 0543  WBC 13.2*  HGB 11.3*  HCT 33.5*  PLT 120*   ------------------------------------------------------------------------------------------------------------------ Chemistries   Recent Labs Lab 08/16/17 1150 08/17/17 0543  NA 133* 135  K 4.0 3.9  CL 100* 105  CO2 22 23  GLUCOSE 182* 116*  BUN 43* 34*  CREATININE 2.69* 2.03*  CALCIUM 8.0* 7.6*  AST 35  --   ALT 20  --   ALKPHOS 42  --   BILITOT 0.9  --    RADIOLOGY:  No results found. ASSESSMENT AND PLAN:   Dolph Tavano  is a 76 y.o. 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 Patient also had an episode of vomiting today.  1. Sepsis due to UTI with recent left ureteral distal stent placement and ongoing issues with urinary retention and hematuria.   continue IV Zosyn, IV fluid support. Follow-up CBC and cultures. No need for reimaging as long as patient continues to clinically improve per Dr. Pilar Jarvis, urologist.  2. Acute renal failure due to  ATN from sepsis -Baseline creatinine1.2 in July 2018 -Presented with crit of 2.69. Renal function is improving. Continue IV fluid support and follow-up BMP. Hold lisinopril.  3. Hypotension secondary to #1 Continue gentle IV fluids, hold  lisinopril. -Patient has history of cardio myopathy. Watch for signs of volume overload.  4. CAD Continue aspirin per Dr. Erlene Quan. Hold Xarelto due to renal failure and hematuria.  Elevated troponin due to demanding ischemia.  5. COPD. Stable, nebulizer when necessary.  Diarrhea. Possible due to UTI and sepsis. May need C. difficile test is still diarrhea.  All the records are reviewed and case discussed with Care Management/Social Worker. Management plans discussed with the patient, family and they are in agreement.  CODE STATUS: Full Code  TOTAL TIME TAKING CARE OF THIS PATIENT: 35 minutes.   More than 50% of the time was spent in counseling/coordination of care: YES  POSSIBLE D/C IN 1-2 DAYS, DEPENDING ON CLINICAL CONDITION.   Demetrios Loll M.D on 08/17/2017 at 2:11 PM  Between 7am to 6pm - Pager - [phone]  After 6pm go to [email or web address] - [facility]

## 2017-08-17 NOTE — Progress Notes (Signed)
Patient continue to have bloody color urine. Now afibrile, stable blood pressure, on 2L supplemental oxygen. Maintenance IV fluid infusing at 72mL/h.Wife at bedside overnight. Pain currently controlled with PRN pain meds. Will continue to assess.  AM WBC dropped from 19 to 13.2.

## 2017-08-17 NOTE — Progress Notes (Addendum)
At 0730 pt. On 2L [place] O2 level at 95%. Decreased Todd Rojas to 1L at 1000, SPO2 at 96%. At 1130 removed all O2 and SPO2 remained at 93%.  Virl Son  Nursing Student

## 2017-08-17 NOTE — Progress Notes (Signed)
Hematuria continues,iv antibiotics continues,weaned off

## 2017-08-17 NOTE — Progress Notes (Signed)
No events overnight AF 12 hrs WBC 19 ->13 Cr improving but still elevated Feeling better No n/v since yesterday +BM Voiding spontaneously, low PVRs  Vitals:   08/16/17 1950 08/16/17 2332 08/17/17 0345 08/17/17 0730  BP: 133/67 (!) 117/55 129/62 121/68  Pulse: 95 84 88 83  Resp:   18   Temp: (!) 102.7 F (39.3 C) 98.4 F (36.9 C) 98 F (36.7 C) 98.6 F (37 C)  TempSrc: Oral Oral Oral Oral  SpO2: 94% 94% 96% 95%  Weight:      Height:       I/O last 3 completed shifts: In: 1008.3 [I.V.:858.3; IV Piggyback:150] Out: 800 [Urine:800] No intake/output data recorded.   NAD Soft NT ND Normal phallus, testicles non tender, no scrotal swelling  CBC    Component Value Date/Time   WBC 13.2 (H) 08/17/2017 0543   RBC 3.43 (L) 08/17/2017 0543   HGB 11.3 (L) 08/17/2017 0543   HCT 33.5 (L) 08/17/2017 0543   PLT 120 (L) 08/17/2017 0543   MCV 97.7 08/17/2017 0543   MCH 32.9 08/17/2017 0543   MCHC 33.6 08/17/2017 0543   RDW 14.0 08/17/2017 0543   LYMPHSABS 0.5 (L) 08/16/2017 1150   MONOABS 0.8 08/16/2017 1150   EOSABS 0.0 08/16/2017 1150   BASOSABS 0.0 08/16/2017 1150   BMP Latest Ref Rng & Units 08/17/2017 08/16/2017 07/17/2017  Glucose 65 - 99 mg/dL 116(H) 182(H) -  BUN 6 - 20 mg/dL 34(H) 43(H) -  Creatinine 0.61 - 1.24 mg/dL 2.03(H) 2.69(H) 1.20  Sodium 135 - 145 mmol/L 135 133(L) -  Potassium 3.5 - 5.1 mmol/L 3.9 4.0 -  Chloride 101 - 111 mmol/L 105 100(L) -  CO2 22 - 32 mmol/L 23 22 -  Calcium 8.9 - 10.3 mg/dL 7.6(L) 8.0(L) -   1. UTI/Pyelonephritis -improving clinically. Continue broad spectrum abx pending c/s -No need for reimaging as long as patient continues to clinically improve  2. AKI -improving. Continue IVF. Likely multifactorial from dehydration and ATN  3. History of retention -low PVRs currently  4. Hematuria -from stent. Hg stable. Continue ASA.

## 2017-08-18 DIAGNOSIS — N12 Tubulo-interstitial nephritis, not specified as acute or chronic: Secondary | ICD-10-CM

## 2017-08-18 DIAGNOSIS — N179 Acute kidney failure, unspecified: Secondary | ICD-10-CM

## 2017-08-18 DIAGNOSIS — R310 Gross hematuria: Secondary | ICD-10-CM

## 2017-08-18 LAB — CBC
HCT: 33.3 % — ABNORMAL LOW (ref 40.0–52.0)
HEMOGLOBIN: 11.3 g/dL — AB (ref 13.0–18.0)
MCH: 33.2 pg (ref 26.0–34.0)
MCHC: 34.1 g/dL (ref 32.0–36.0)
MCV: 97.5 fL (ref 80.0–100.0)
Platelets: 133 10*3/uL — ABNORMAL LOW (ref 150–440)
RBC: 3.42 MIL/uL — ABNORMAL LOW (ref 4.40–5.90)
RDW: 13.6 % (ref 11.5–14.5)
WBC: 12.3 10*3/uL — ABNORMAL HIGH (ref 3.8–10.6)

## 2017-08-18 LAB — BLOOD CULTURE ID PANEL (REFLEXED)

## 2017-08-18 LAB — BASIC METABOLIC PANEL WITH GFR
Anion gap: 7 (ref 5–15)
BUN: 34 mg/dL — ABNORMAL HIGH (ref 6–20)
CO2: 25 mmol/L (ref 22–32)
Calcium: 7.6 mg/dL — ABNORMAL LOW (ref 8.9–10.3)
Chloride: 105 mmol/L (ref 101–111)
Creatinine, Ser: 1.7 mg/dL — ABNORMAL HIGH (ref 0.61–1.24)
GFR calc Af Amer: 43 mL/min — ABNORMAL LOW
GFR calc non Af Amer: 37 mL/min — ABNORMAL LOW
Glucose, Bld: 99 mg/dL (ref 65–99)
Potassium: 3.6 mmol/L (ref 3.5–5.1)
Sodium: 137 mmol/L (ref 135–145)

## 2017-08-18 LAB — CULTURE, URINE COMPREHENSIVE

## 2017-08-18 MED ORDER — CIPROFLOXACIN HCL 500 MG PO TABS
500.0000 mg | ORAL_TABLET | Freq: Two times a day (BID) | ORAL | Status: DC
  Administered 2017-08-18 – 2017-08-19 (×2): 500 mg via ORAL
  Filled 2017-08-18 (×3): qty 1

## 2017-08-18 MED ORDER — DEXTROSE 5 % IV SOLN
2.0000 g | Freq: Two times a day (BID) | INTRAVENOUS | Status: DC
  Administered 2017-08-18 – 2017-08-19 (×3): 2 g via INTRAVENOUS
  Filled 2017-08-18 (×5): qty 2

## 2017-08-18 NOTE — Progress Notes (Signed)
PHARMACY - PHYSICIAN COMMUNICATION CRITICAL VALUE ALERT - BLOOD CULTURE IDENTIFICATION (BCID)  Results for orders placed or performed during the hospital encounter of 08/16/17  Blood Culture ID Panel (Reflexed) (Collected: 08/16/2017 12:04 PM)  Result Value Ref Range   Enterococcus species NOT DETECTED NOT DETECTED   Listeria monocytogenes NOT DETECTED NOT DETECTED   Staphylococcus species NOT DETECTED NOT DETECTED   Staphylococcus aureus NOT DETECTED NOT DETECTED   Streptococcus species NOT DETECTED NOT DETECTED   Streptococcus agalactiae NOT DETECTED NOT DETECTED   Streptococcus pneumoniae NOT DETECTED NOT DETECTED   Streptococcus pyogenes NOT DETECTED NOT DETECTED   Acinetobacter baumannii NOT DETECTED NOT DETECTED   Enterobacteriaceae species NOT DETECTED NOT DETECTED   Enterobacter cloacae complex NOT DETECTED NOT DETECTED   Escherichia coli NOT DETECTED NOT DETECTED   Klebsiella oxytoca NOT DETECTED NOT DETECTED   Klebsiella pneumoniae NOT DETECTED NOT DETECTED   Proteus species NOT DETECTED NOT DETECTED   Serratia marcescens NOT DETECTED NOT DETECTED   Carbapenem resistance NOT DETECTED NOT DETECTED   Haemophilus influenzae NOT DETECTED NOT DETECTED   Neisseria meningitidis NOT DETECTED NOT DETECTED   Pseudomonas aeruginosa DETECTED (A) NOT DETECTED   Candida albicans NOT DETECTED NOT DETECTED   Candida glabrata NOT DETECTED NOT DETECTED   Candida krusei NOT DETECTED NOT DETECTED   Candida parapsilosis NOT DETECTED NOT DETECTED   Candida tropicalis NOT DETECTED NOT DETECTED    Name of physician (or Provider) Contacted: Q.Chen  Changes to prescribed antibiotics required: Will transition from Zosyn to Ceftazidime 2 gram IV Q12h (adjusted for Crcl= 41 ml/min). Blood cx: 1 of 4 bottles(aerobic) with + GNR: + Pseudomonas Areuginosa, KPC negative.  Note: Urinecx also with 20,000 GNR  Joylyn Duggin A 08/18/2017  12:06 PM

## 2017-08-18 NOTE — Discharge Instructions (Signed)
Heart healthy diet

## 2017-08-18 NOTE — Care Management (Signed)
Positive blood cultures. Spoke with ID.   At present would not anticipate need for long term IV antibiotics if the pseudomonas is sensitive to Cipro . These results are pending

## 2017-08-18 NOTE — Consult Note (Signed)
[facility] Infectious Disease     Reason for Consult: Estanislado Spire    Referring Physician: sepsis, pseudomonal bacteremia  Date of Admission:  08/16/2017   Active Problems:   Sepsis ([place])   Acute renal failure with tubular necrosis ([place])   Gross hematuria   HPI: Todd Rojas is a 76 y.o. male admitted from urology clinic with fevers and vomiting. He has recently had ureteroscopy and laser lithotripsy and ureteral stent placement 8/22 by Dr Pilar Jarvis for a L distal ureter stone and ureterocele.  He was previously seen 8/24 in urology and given ceftriaxone. On this admit wbc 19, temp 103, Cr elevated 2.6 and LA 2.4UA with TNTC WBC. Imaging showed pulm edema on cxr and abd xray neg for stones. Seen by urology. BCX and ucx now + Pseudomonas. Initially on zosyn and vanco - now on ceftazidime. WBC improving and no further fevers.  Renal fxn improving as well with cr down to 1.7   He has hx of cardomyopathy, CAD, asthma, hypertension. Past Medical History:  Diagnosis Date  . Asthma   . CAD (coronary artery disease) 02/13/2012   Overview:   12/2004 - Anginal equivalent symptoms:  Left arm pain, weakness, and shortness of breath.   12/2004:  Anterior STEMI.  Transferred to [facility].   01/06/2005 - Cardiac cath:  EF 51%, an occluded mid LAD and a large OM2 with 75% ostial stenosis.  PCI of the mid occluded LAD using a Cypher stent.   03/2005 - 2D echo:  EF >55%, LAE 4.7 cm, no significant valvular disease.   02/2008:  Plavix stopped.   06/2008:  Recurrent angina - symptoms of chest tightness.  Presented to [facility].  Cardiac catheterization.  The patient stated nonobstructive CAD seen.  Plavix restarted at that time.   03/2009 - 2D echo:  EF 35%, akinetic apex and septum.  Stress echocardiogram deferred for cardiac catheterization.   03/30/2009:  PCI 90% proximal LAD with a 3.0 x 18 mm Xience DES.   02/2010: Slow and gradual return of chest tightness, fatigue and generalized  weakness concerning for return of anginal equivalent.  Stress test did not show ischemia to the heart muscle during exercise.  [facility]  . CHF (congestive heart failure) ([place])   . COPD (chronic obstructive pulmonary disease) ([place]) 07/03/2017  . Dyslipidemia 02/13/2012   Overview:   03/2009  TC 158, TG 88, HDL 39, LDL 102.  11/2013  TC 148, TG 115, HDL 44.3, LDL 80.7 (done at PCP's office)   10/2014 TC 146  11/2015 TC 123, TG 86, HDL 41.7, LDL 64  11/2016 TC 140, TG 129, HDL 40.1, LDL 74   . Dyspnea   . GERD (gastroesophageal reflux disease)   . GI disease 03/09/2013   Overview:   Dysphagia - EGD demonstrated esophageal stricture which was dilated   GERD   Hemorrhoids   . Hearing loss 03/09/2013  . Hyperlipidemia, unspecified 07/03/2017  . Hypertension 02/13/2012  . Myocardial infarction ([place])   . Personal history of gout 12/09/2016  . Primary osteoarthritis of both knees 05/05/2015  . Right-sided low back pain without sciatica 04/14/2016   Past Surgical History:  Procedure Laterality Date  . coronary stents    . CYSTOSCOPY WITH STENT PLACEMENT Left 08/09/2017   Procedure: CYSTOSCOPY WITH STENT PLACEMENT;  Surgeon: Nickie Retort, MD;  Location: [facility] ORS;  Service: Urology;  Laterality: Left;  Marland Kitchen MEDIAL PARTIAL KNEE REPLACEMENT Bilateral 2016  . URETEROSCOPY WITH HOLMIUM LASER LITHOTRIPSY  Left 08/09/2017   Procedure: URETEROSCOPY WITH HOLMIUM LASER LITHOTRIPSY/INCISION OF URETEROCELE;  Surgeon: Nickie Retort, MD;  Location: [facility] ORS;  Service: Urology;  Laterality: Left;   Social History  Substance Use Topics  . Smoking status: Former Smoker    Quit date: 07/26/1985  . Smokeless tobacco: Never Used  . Alcohol use No   Family History  Problem Relation Age of Onset  . Prostate cancer Neg Hx   . Kidney cancer Neg Hx   . Bladder Cancer Neg Hx     Allergies: No Known Allergies  Current antibiotics: Antibiotics Given (last 72 hours)    Date/Time Action Medication Dose Rate    08/16/17 1216 New Bag/Given   piperacillin-tazobactam (ZOSYN) IVPB 3.375 g 3.375 g 100 mL/hr   08/16/17 1219 New Bag/Given   vancomycin (VANCOCIN) IVPB 1000 mg/200 mL premix 1,000 mg 200 mL/hr   08/16/17 1701 New Bag/Given   piperacillin-tazobactam (ZOSYN) IVPB 3.375 g 3.375 g 12.5 mL/hr   08/16/17 2132 New Bag/Given   piperacillin-tazobactam (ZOSYN) IVPB 3.375 g 3.375 g 12.5 mL/hr   08/17/17 0538 New Bag/Given   piperacillin-tazobactam (ZOSYN) IVPB 3.375 g 3.375 g 12.5 mL/hr   08/17/17 1416 New Bag/Given   piperacillin-tazobactam (ZOSYN) IVPB 3.375 g 3.375 g 12.5 mL/hr   08/17/17 2150 New Bag/Given   piperacillin-tazobactam (ZOSYN) IVPB 3.375 g 3.375 g 12.5 mL/hr   08/18/17 0973 New Bag/Given   piperacillin-tazobactam (ZOSYN) IVPB 3.375 g 3.375 g 12.5 mL/hr   08/18/17 1358 New Bag/Given   cefTAZidime (FORTAZ) 2 g in dextrose 5 % 50 mL IVPB 2 g 100 mL/hr      MEDICATIONS: . allopurinol  300 mg Oral Daily  . aspirin  81 mg Oral Daily  . calcium carbonate  2 tablet Oral BID  . docusate sodium  100 mg Oral BID  . heparin  5,000 Units Subcutaneous Q8H  . mouth rinse  15 mL Mouth Rinse BID  . pravastatin  80 mg Oral QPM  . vitamin B-12  1,000 mcg Oral Daily    Review of Systems - 11 systems reviewed and negative per HPI   OBJECTIVE: Temp:  [98.1 F (36.7 C)-99.3 F (37.4 C)] 98.5 F (36.9 C) (08/31 1138) Pulse Rate:  [68-84] 70 (08/31 1138) Resp:  [17-20] 20 (08/31 0735) BP: (116-143)/(67-71) 116/67 (08/31 1138) SpO2:  [92 %-96 %] 96 % (08/31 1138)   LABS: Results for orders placed or performed during the hospital encounter of 08/16/17 (from the past 48 hour(s))  Urinalysis, Routine w reflex microscopic     Status: Abnormal   Collection Time: 08/16/17  2:45 PM  Result Value Ref Range   Color, Urine BROWN (A) YELLOW    Comment: BIOCHEMICALS MAY BE AFFECTED BY COLOR   APPearance TURBID (A) CLEAR   Specific Gravity, Urine 1.019 1.005 - 1.030   pH 6.0 5.0 - 8.0    Glucose, UA NEGATIVE NEGATIVE mg/dL   Hgb urine dipstick LARGE (A) NEGATIVE   Bilirubin Urine NEGATIVE NEGATIVE   Ketones, ur NEGATIVE NEGATIVE mg/dL   Protein, ur 100 (A) NEGATIVE mg/dL   Nitrite NEGATIVE NEGATIVE   Leukocytes, UA TRACE (A) NEGATIVE   RBC / HPF TOO NUMEROUS TO COUNT 0 - 5 RBC/hpf   WBC, UA TOO NUMEROUS TO COUNT 0 - 5 WBC/hpf   Bacteria, UA NONE SEEN NONE SEEN   Squamous Epithelial / LPF NONE SEEN NONE SEEN   Amorphous Crystal PRESENT   Urine culture     Status: Abnormal (Preliminary  result)   Collection Time: 08/16/17  2:45 PM  Result Value Ref Range   Specimen Description URINE, RANDOM    Special Requests NONE    Culture 20,000 COLONIES/mL PSEUDOMONAS AERUGINOSA (A)    Report Status PENDING   Lactic acid, plasma     Status: None   Collection Time: 08/16/17  3:04 PM  Result Value Ref Range   Lactic Acid, Venous 1.6 0.5 - 1.9 mmol/L  Basic metabolic panel     Status: Abnormal   Collection Time: 08/17/17  5:43 AM  Result Value Ref Range   Sodium 135 135 - 145 mmol/L   Potassium 3.9 3.5 - 5.1 mmol/L   Chloride 105 101 - 111 mmol/L   CO2 23 22 - 32 mmol/L   Glucose, Bld 116 (H) 65 - 99 mg/dL   BUN 34 (H) 6 - 20 mg/dL   Creatinine, Ser 2.03 (H) 0.61 - 1.24 mg/dL   Calcium 7.6 (L) 8.9 - 10.3 mg/dL   GFR calc non Af Amer 30 (L) >60 mL/min   GFR calc Af Amer 35 (L) >60 mL/min    Comment: (NOTE) The eGFR has been calculated using the CKD EPI equation. This calculation has not been validated in all clinical situations. eGFR's persistently <60 mL/min signify possible Chronic Kidney Disease.    Anion gap 7 5 - 15  CBC     Status: Abnormal   Collection Time: 08/17/17  5:43 AM  Result Value Ref Range   WBC 13.2 (H) 3.8 - 10.6 K/uL   RBC 3.43 (L) 4.40 - 5.90 MIL/uL   Hemoglobin 11.3 (L) 13.0 - 18.0 g/dL   HCT 33.5 (L) 40.0 - 52.0 %   MCV 97.7 80.0 - 100.0 fL   MCH 32.9 26.0 - 34.0 pg   MCHC 33.6 32.0 - 36.0 g/dL   RDW 14.0 11.5 - 14.5 %   Platelets 120 (L) 150  - 440 K/uL  C difficile quick scan w PCR reflex     Status: None   Collection Time: 08/17/17  6:00 PM  Result Value Ref Range   C Diff antigen NEGATIVE NEGATIVE   C Diff toxin NEGATIVE NEGATIVE   C Diff interpretation No C. difficile detected.   Basic metabolic panel     Status: Abnormal   Collection Time: 08/18/17  4:13 AM  Result Value Ref Range   Sodium 137 135 - 145 mmol/L   Potassium 3.6 3.5 - 5.1 mmol/L   Chloride 105 101 - 111 mmol/L   CO2 25 22 - 32 mmol/L   Glucose, Bld 99 65 - 99 mg/dL   BUN 34 (H) 6 - 20 mg/dL   Creatinine, Ser 1.70 (H) 0.61 - 1.24 mg/dL   Calcium 7.6 (L) 8.9 - 10.3 mg/dL   GFR calc non Af Amer 37 (L) >60 mL/min   GFR calc Af Amer 43 (L) >60 mL/min    Comment: (NOTE) The eGFR has been calculated using the CKD EPI equation. This calculation has not been validated in all clinical situations. eGFR's persistently <60 mL/min signify possible Chronic Kidney Disease.    Anion gap 7 5 - 15  CBC     Status: Abnormal   Collection Time: 08/18/17  4:13 AM  Result Value Ref Range   WBC 12.3 (H) 3.8 - 10.6 K/uL   RBC 3.42 (L) 4.40 - 5.90 MIL/uL   Hemoglobin 11.3 (L) 13.0 - 18.0 g/dL   HCT 33.3 (L) 40.0 - 52.0 %   MCV 97.5 80.0 -  100.0 fL   MCH 33.2 26.0 - 34.0 pg   MCHC 34.1 32.0 - 36.0 g/dL   RDW 13.6 11.5 - 14.5 %   Platelets 133 (L) 150 - 440 K/uL   No components found for: ESR, C REACTIVE PROTEIN MICRO: Recent Results (from the past 720 hour(s))  Microscopic Examination     Status: Abnormal   Collection Time: 07/20/17  4:03 PM  Result Value Ref Range Status   WBC, UA 0-5 0 - 5 /hpf Final   RBC, UA 11-30 (A) 0 - 2 /hpf Final   Epithelial Cells (non renal) 0-10 0 - 10 /hpf Final   Bacteria, UA None seen None seen/Few Final  Urine culture     Status: None   Collection Time: 07/20/17  4:07 PM  Result Value Ref Range Status   Urine Culture, Routine Final report  Final   Organism ID, Bacteria Comment  Final    Comment: Mixed urogenital  flora 50,000-100,000 colony forming units per mL   Stone analysis     Status: None   Collection Time: 08/09/17 12:54 PM  Result Value Ref Range Status   Color Black  Final   Size 10x9x9 mm Final   Stone Weight KSTONE 499.0 mg Corrected   Nidus Comment  Corrected    Comment: Nidus composed of Calcium oxalate monohydrate.   Ca Oxalate,Monohydr. 97 % Corrected   Ca phos cry stone ql IR 3 % Corrected   Composition Comment  Corrected    Comment: Percentage (Represents the % composition)   Photo Comment  Corrected    Comment: Photograph will follow under separate cover.   Comment: Comment  Corrected    Comment: (NOTE) Physician questions regarding Calculi Analysis contact [facility] at: [phone].    PLEASE NOTE: Comment  Corrected    Comment: (NOTE) Calculi report with photograph will follow via computer, mail or courier delivery.    Disclaimer - Kidney Stone Analysis: Comment  Corrected    Comment: (NOTE) This test was developed and its performance characteristics determined by [facility]. It has not been cleared or approved by the Food and Drug Administration. Performed At: [facility] [place], [place] [id number] Lindon Romp MD [id number]   Urine Culture     Status: None   Collection Time: 08/11/17  5:24 PM  Result Value Ref Range Status   Specimen Description URINE, CATHETERIZED  Final   Special Requests Normal  Final   Culture   Final    NO GROWTH Performed at [facility], [address] [place], [place] [address]    Report Status 08/13/2017 FINAL  Final  Microscopic Examination     Status: Abnormal   Collection Time: 08/15/17  9:12 AM  Result Value Ref Range Status   WBC, UA 11-30 (A) 0 - 5 /hpf Final   RBC, UA >30 (H) 0 - 2 /hpf Final   Epithelial Cells (non renal) None seen 0 - 10 /hpf Final   Casts Present (A) None seen /lpf Final   Cast Type Hyaline casts N/A Final   Mucus, UA Present (A) Not Estab. Final   Bacteria, UA  Many (A) None seen/Few Final  CULTURE, URINE COMPREHENSIVE     Status: Abnormal   Collection Time: 08/15/17  1:14 PM  Result Value Ref Range Status   Urine Culture, Comprehensive Final report (A)  Final   Organism ID, Bacteria Comment (A)  Final    Comment: Pseudomonas aeruginosa Greater than 100,000 colony  forming units per mL    ANTIMICROBIAL SUSCEPTIBILITY Comment  Final    Comment:       ** S = Susceptible; I = Intermediate; R = Resistant **                    P = Positive; N = Negative             MICS are expressed in micrograms per mL    Antibiotic                 RSLT#1    RSLT#2    RSLT#3    RSLT#4 Amikacin                       S Cefepime                       S Ceftazidime                    S Ciprofloxacin                  S Gentamicin                     S Imipenem                       S Levofloxacin                   S Meropenem                      S Piperacillin                   S Ticarcillin                    S Tobramycin                     S   Blood culture (routine x 2)     Status: None (Preliminary result)   Collection Time: 08/16/17 12:03 PM  Result Value Ref Range Status   Specimen Description BLOOD RIGHT ANTECUBITAL  Final   Special Requests   Final    BOTTLES DRAWN AEROBIC AND ANAEROBIC Blood Culture results may not be optimal due to an excessive volume of blood received in culture bottles   Culture NO GROWTH 2 DAYS  Final   Report Status PENDING  Incomplete  Blood culture (routine x 2)     Status: None (Preliminary result)   Collection Time: 08/16/17 12:04 PM  Result Value Ref Range Status   Specimen Description BLOOD BLOOD LEFT HAND  Final   Special Requests   Final    BOTTLES DRAWN AEROBIC AND ANAEROBIC Blood Culture adequate volume   Culture  Setup Time   Final    Organism ID to follow GRAM NEGATIVE RODS AEROBIC BOTTLE ONLY CRITICAL RESULT CALLED TO, READ BACK BY AND VERIFIED WITH: CHRISTINE KATSOUDAS AT 9735 ON 08/18/17 [place].    Culture  GRAM NEGATIVE RODS  Final   Report Status PENDING  Incomplete  Blood Culture ID Panel (Reflexed)     Status: Abnormal   Collection Time: 08/16/17 12:04 PM  Result Value Ref Range Status   Enterococcus species NOT DETECTED NOT DETECTED Final   Listeria monocytogenes NOT DETECTED NOT DETECTED Final   Staphylococcus species NOT DETECTED NOT DETECTED Final  Staphylococcus aureus NOT DETECTED NOT DETECTED Final   Streptococcus species NOT DETECTED NOT DETECTED Final   Streptococcus agalactiae NOT DETECTED NOT DETECTED Final   Streptococcus pneumoniae NOT DETECTED NOT DETECTED Final   Streptococcus pyogenes NOT DETECTED NOT DETECTED Final   Acinetobacter baumannii NOT DETECTED NOT DETECTED Final   Enterobacteriaceae species NOT DETECTED NOT DETECTED Final   Enterobacter cloacae complex NOT DETECTED NOT DETECTED Final   Escherichia coli NOT DETECTED NOT DETECTED Final   Klebsiella oxytoca NOT DETECTED NOT DETECTED Final   Klebsiella pneumoniae NOT DETECTED NOT DETECTED Final   Proteus species NOT DETECTED NOT DETECTED Final   Serratia marcescens NOT DETECTED NOT DETECTED Final   Carbapenem resistance NOT DETECTED NOT DETECTED Final   Haemophilus influenzae NOT DETECTED NOT DETECTED Final   Neisseria meningitidis NOT DETECTED NOT DETECTED Final   Pseudomonas aeruginosa DETECTED (A) NOT DETECTED Final    Comment: CRITICAL RESULT CALLED TO, READ BACK BY AND VERIFIED WITH: CHRISTINE KATSOUDAS AT 9924 ON 08/18/17 [place].    Candida albicans NOT DETECTED NOT DETECTED Final   Candida glabrata NOT DETECTED NOT DETECTED Final   Candida krusei NOT DETECTED NOT DETECTED Final   Candida parapsilosis NOT DETECTED NOT DETECTED Final   Candida tropicalis NOT DETECTED NOT DETECTED Final  Urine culture     Status: Abnormal (Preliminary result)   Collection Time: 08/16/17  2:45 PM  Result Value Ref Range Status   Specimen Description URINE, RANDOM  Final   Special Requests NONE  Final   Culture 20,000  COLONIES/mL PSEUDOMONAS AERUGINOSA (A)  Final   Report Status PENDING  Incomplete  C difficile quick scan w PCR reflex     Status: None   Collection Time: 08/17/17  6:00 PM  Result Value Ref Range Status   C Diff antigen NEGATIVE NEGATIVE Final   C Diff toxin NEGATIVE NEGATIVE Final   C Diff interpretation No C. difficile detected.  Final    IMAGING: Dg Abdomen 1 View  Result Date: 08/16/2017 CLINICAL DATA:  Recent stent placement.  Sepsis EXAM: ABDOMEN - 1 VIEW COMPARISON:  07/17/2017 FINDINGS: Left-sided nephroureteral is identified. The previously noted left renal calculus no longer visualized nonobstructive bowel gas pattern. IMPRESSION: Status post left ureteral stenting with resolution of previous left renal calculus. Electronically Signed   By: Kerby Moors M.D.   On: 08/16/2017 13:56   Dg Chest Port 1 View  Result Date: 08/16/2017 CLINICAL DATA:  Initial evaluation for acute sepsis, fever. EXAM: PORTABLE CHEST 1 VIEW COMPARISON:  Prior CT from 08/12/2014. FINDINGS: Accentuation of the cardiac silhouette related to shallow lung inflation and AP technique. Heart size grossly stable and within normal limits. Mediastinal silhouette normal. Lungs are hypoinflated. Small layering right pleural effusion. Bibasilar opacities favored to reflect atelectasis, although superimposed infiltrate not entirely excluded, particularly at the right lung base. Pulmonary vascular congestion with interstitial prominence, suggesting mild pulmonary interstitial edema. No other definite focal infiltrates. No pneumothorax. No acute osseus abnormality.  Degenerative changes of shoulders. IMPRESSION: 1. Pulmonary vascular congestion with interstitial prominence, most suggestive of mild pulmonary interstitial edema. 2. Small right pleural effusion. Associated bibasilar opacities favored to reflect atelectasis, although superimposed infiltrate not entirely excluded, particularly at the right lung base. Electronically  Signed   By: Jeannine Boga M.D.   On: 08/16/2017 12:50    Assessment:   Todd Rojas is a 76 y.o. male Pseudomonal bacteremia and UTI in setting of recent L distal ureter stone s/p laser lithotripsy and ureteral stent  placement 8/22  Had to have foley placed a few days after procedure due to urinary retention and then had to have foley flused at one point  but has been removed.   Feels a little better but still weak and no appetite.  Last fever 8/29.  Recommendations Continue ceftazidime pending final urine cultures.  Will add cipro as well empirically to increase likelihood that the Pseudomonas is appropriately covered due to the bacteremia and sepsis.   If sensitive to ciprofloxacin would dc home on Pseudomonal dosing of ciprofloxaxin per pharmacy If not sensitive to quinolones then would need picc placement and treatment with an IV abx to which it is sensitive (usually cefepime, ceftazidime or meropenem).   I discussed these 2 possibilities with the patient and his wife.   Duration of abx should be 7- 10 days after removal of the ureteral stent.  He tells me he is scheduled to have this removed next Wed at urology.  Please schedule fu with me in 1-2 weeks in ID clinic when dced    Thank you very much for allowing me to participate in the care of this patient. Please call with questions.   Cheral Marker. Ola Spurr, MD

## 2017-08-18 NOTE — Progress Notes (Signed)
Pharmacy Antibiotic Note  Todd Rojas is a 76 y.o. male admitted on 08/16/2017 with bacteremia/?UTI.  Pharmacy has been consulted for Ceftazidime dosing.  Bcx: + Pseudomonas aeruginosa, 1 of 4 bottles Ucx: 20,000  +GNR  Plan: Patient currently on Zosyn. Will transition to Ceftazidime 2 gram IV Q12h (adjusted for renal fxn Crcl 41 ml/min).   Height: 5\' 9"  (175.3 cm) Weight: 199 lb (90.3 kg) IBW/kg (Calculated) : 70.7  Temp (24hrs), Avg:98.6 F (37 C), Min:98.1 F (36.7 C), Max:99.3 F (37.4 C)   Recent Labs Lab 08/16/17 1150 08/16/17 1204 08/16/17 1504 08/17/17 0543 08/18/17 0413  WBC 19.0*  --   --  13.2* 12.3*  CREATININE 2.69*  --   --  2.03* 1.70*  LATICACIDVEN  --  2.4* 1.6  --   --     Estimated Creatinine Clearance: 41 mL/min (A) (by C-G formula based on SCr of 1.7 mg/dL (H)).    No Known Allergies  Antimicrobials this admission: Zosyn 8/29 >> 8/31 Ceftaz 8/31 >>    Dose adjustments this admission:    Microbiology results: 8/29 BCx: 1 of 4 + for  Pseudomonas aeruginosa 8/29 UCx: 20,000 GNR+    Sputum:      MRSA PCR:    Thank you for allowing pharmacy to be a part of this patient's care.  [name] 08/18/2017 12:16 PM

## 2017-08-18 NOTE — Progress Notes (Signed)
Pharmacy Antibiotic Note  Todd Rojas is a 76 y.o. male admitted on 08/16/2017 with bacteremia/?UTI.  Pharmacy has been consulted for Ceftazidime dosing. Dr. Ola Spurr wants to add ciprofloxacin PO pending sensitivities and discharge on oral ciprofloxacin if Pseudomonas is sensitive.   Bcx: + Pseudomonas aeruginosa, 1 of 4 bottles Ucx: 20,000  +GNR  Plan: Will add ciprofloxacin 500 mg oral bid.   Patient currently on Zosyn. Will transition to Ceftazidime 2 gram IV Q12h (adjusted for renal fxn Crcl 41 ml/min).   Height: 5\' 9"  (175.3 cm) Weight: 199 lb (90.3 kg) IBW/kg (Calculated) : 70.7  Temp (24hrs), Avg:98.6 F (37 C), Min:98.1 F (36.7 C), Max:99.3 F (37.4 C)   Recent Labs Lab 08/16/17 1150 08/16/17 1204 08/16/17 1504 08/17/17 0543 08/18/17 0413  WBC 19.0*  --   --  13.2* 12.3*  CREATININE 2.69*  --   --  2.03* 1.70*  LATICACIDVEN  --  2.4* 1.6  --   --     Estimated Creatinine Clearance: 41 mL/min (A) (by C-G formula based on SCr of 1.7 mg/dL (H)).    No Known Allergies  Antimicrobials this admission: Zosyn 8/29 >> 8/31 Ceftaz 8/31 >>    Dose adjustments this admission:    Microbiology results: 8/29 BCx: 1 of 4 + for  Pseudomonas aeruginosa 8/29 UCx: 20,000 GNR+    Sputum:      MRSA PCR:    Thank you for allowing pharmacy to be a part of this patient's care.  Ulice Dash D 08/18/2017 4:18 PM

## 2017-08-18 NOTE — Progress Notes (Signed)
No events overnight AF 36 hrs WBC improved Cr continues to improve Feeling better No n/v +BM Voiding spontaneously, low PVRs  Vitals:   08/17/17 1130 08/17/17 1929 08/18/17 0336 08/18/17 0735  BP: (!) 106/58 136/70 (!) 143/71 125/67  Pulse: 77 84 76 68  Resp: 18 17 18 20   Temp: 98.1 F (36.7 C) 99.3 F (37.4 C) 98.4 F (36.9 C) 98.1 F (36.7 C)  TempSrc: Oral Oral Oral Oral  SpO2: 93% 92% 94% 95%  Weight:      Height:       I/O last 3 completed shifts: In: 1610.8 [P.O.:240; I.V.:1220.8; IV Piggyback:150] Out: 8832 [Urine:1675] Total I/O In: -  Out: 275 [Urine:275]   NAD Soft NT ND  CBC    Component Value Date/Time   WBC 12.3 (H) 08/18/2017 0413   RBC 3.42 (L) 08/18/2017 0413   HGB 11.3 (L) 08/18/2017 0413   HCT 33.3 (L) 08/18/2017 0413   PLT 133 (L) 08/18/2017 0413   MCV 97.5 08/18/2017 0413   MCH 33.2 08/18/2017 0413   MCHC 34.1 08/18/2017 0413   RDW 13.6 08/18/2017 0413   LYMPHSABS 0.5 (L) 08/16/2017 1150   MONOABS 0.8 08/16/2017 1150   EOSABS 0.0 08/16/2017 1150   BASOSABS 0.0 08/16/2017 1150   BMP Latest Ref Rng & Units 08/18/2017 08/17/2017 08/16/2017  Glucose 65 - 99 mg/dL 99 116(H) 182(H)  BUN 6 - 20 mg/dL 34(H) 34(H) 43(H)  Creatinine 0.61 - 1.24 mg/dL 1.70(H) 2.03(H) 2.69(H)  Sodium 135 - 145 mmol/L 137 135 133(L)  Potassium 3.5 - 5.1 mmol/L 3.6 3.9 4.0  Chloride 101 - 111 mmol/L 105 105 100(L)  CO2 22 - 32 mmol/L 25 23 22   Calcium 8.9 - 10.3 mg/dL 7.6(L) 7.6(L) 8.0(L)   1. UTI/Pyelonephritis -continues to improve clinically. Continue broad spectrum abx pending c/s -No need for reimaging as long as patient continues to clinically improve -No further urological intervention this admission. Follow up arranged for next week for stent removal in the office  2. AKI -improving.  3. History of retention -low PVRs currently  4. Hematuria -Expected 2/2 stent. No intervention warranted.

## 2017-08-18 NOTE — Progress Notes (Addendum)
[facility] at [place] NAME: Todd Rojas    MR#:  [id number]  DATE OF BIRTH:  [birth year]  SUBJECTIVE:  CHIEF COMPLAINT:   Chief Complaint  Patient presents with  . Fever   No fever or flank pain. Has hematuria. Diarrhea with resolved today. REVIEW OF SYSTEMS:  Review of Systems  Constitutional: Negative for chills, fever and malaise/fatigue.  HENT: Negative for sore throat.   Eyes: Negative for blurred vision and double vision.  Respiratory: Negative for cough, hemoptysis, shortness of breath, wheezing and stridor.   Cardiovascular: Negative for chest pain, palpitations, orthopnea and leg swelling.  Gastrointestinal: Negative for abdominal pain, blood in stool, diarrhea, melena, nausea and vomiting.  Genitourinary: Positive for hematuria. Negative for dysuria and flank pain.  Musculoskeletal: Negative for back pain and joint pain.  Skin: Negative for rash.  Neurological: Negative for dizziness, sensory change, focal weakness, seizures, loss of consciousness, weakness and headaches.  Endo/Heme/Allergies: Negative for polydipsia.  Psychiatric/Behavioral: Negative for depression. The patient is not nervous/anxious.     DRUG ALLERGIES:  No Known Allergies VITALS:  Blood pressure 116/67, pulse 70, temperature 98.5 F (36.9 C), temperature source Oral, resp. rate 20, height 5\' 9"  (1.753 m), weight 199 lb (90.3 kg), SpO2 96 %. PHYSICAL EXAMINATION:  Physical Exam  Constitutional: He is oriented to person, place, and time and well-developed, well-nourished, and in no distress.  HENT:  Head: Normocephalic.  Mouth/Throat: Oropharynx is clear and moist.  Eyes: Pupils are equal, round, and reactive to light. Conjunctivae and EOM are normal. No scleral icterus.  Neck: Normal range of motion. Neck supple. No JVD present. No tracheal deviation present.  Cardiovascular: Normal rate, regular rhythm and normal heart sounds.  Exam reveals no  gallop.   No murmur heard. Pulmonary/Chest: Effort normal and breath sounds normal. No respiratory distress. He has no wheezes. He has no rales.  Abdominal: Soft. Bowel sounds are normal. He exhibits no distension. There is no tenderness. There is no rebound.  Musculoskeletal: Normal range of motion. He exhibits no edema or tenderness.  Neurological: He is alert and oriented to person, place, and time. No cranial nerve deficit.  Skin: No rash noted. No erythema.  Psychiatric: Affect normal.   LABORATORY PANEL:  Male CBC  Recent Labs Lab 08/18/17 0413  WBC 12.3*  HGB 11.3*  HCT 33.3*  PLT 133*   ------------------------------------------------------------------------------------------------------------------ Chemistries   Recent Labs Lab 08/16/17 1150  08/18/17 0413  NA 133*  < > 137  K 4.0  < > 3.6  CL 100*  < > 105  CO2 22  < > 25  GLUCOSE 182*  < > 99  BUN 43*  < > 34*  CREATININE 2.69*  < > 1.70*  CALCIUM 8.0*  < > 7.6*  AST 35  --   --   ALT 20  --   --   ALKPHOS 42  --   --   BILITOT 0.9  --   --   < > = values in this interval not displayed. RADIOLOGY:  No results found. ASSESSMENT AND PLAN:   Todd Rojas  is a 76 y.o. 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 Patient also had an episode of vomiting today.  1. Sepsis due to UTI with recent left ureteral distal stent placement and ongoing issues with  urinary retention and hematuria.  Discontinue IV Zosyn,  Change to [place] due to positive blood culture and urine culture for Pseudomonas. Follow-up sensitivity and ID consult. Per Dr. Ola Spurr, Continue ceftazidime pending final urine cultures.  Will add cipro as well empirically to increase likelihood that the Pseudomonas is appropriately covered due to the bacteremia and sepsis. should be 7- 10 days after removal of  the ureteral stent.   2. Acute renal failure due to ATN from sepsis -Baseline creatinine1.2 in July 2018 -Presented with crit of 2.69. Renal function is improving. Continue IV fluid support and follow-up BMP. Hold lisinopril.  3. Hypotension secondary to #1 Continue gentle IV fluids, hold lisinopril. -Patient has history of cardio myopathy. Watch for signs of volume overload.  4. CAD Can resume Xarelto per Dr. Pilar Jarvis. Elevated troponin due to demanding ischemia.   5. COPD. Stable, nebulizer when necessary.   Diarrhea. Possible due to UTI and sepsis. resovled.  I discussed with Dr. Pilar Jarvis, All the records are reviewed and case discussed with Care Management/Social Worker. Management plans discussed with the patient, family and they are in agreement.  CODE STATUS: Full Code  TOTAL TIME TAKING CARE OF THIS PATIENT: 35 minutes.   More than 50% of the time was spent in counseling/coordination of care: YES  POSSIBLE D/C IN 1-2 DAYS, DEPENDING ON CLINICAL CONDITION.   Demetrios Loll M.D on 08/18/2017 at 4:01 PM  Between 7am to 6pm - Pager - [phone]  After 6pm go to [email or web address] - [facility]

## 2017-08-19 LAB — BASIC METABOLIC PANEL
Anion gap: 4 — ABNORMAL LOW (ref 5–15)
BUN: 28 mg/dL — AB (ref 6–20)
CALCIUM: 7.8 mg/dL — AB (ref 8.9–10.3)
CO2: 27 mmol/L (ref 22–32)
CREATININE: 1.51 mg/dL — AB (ref 0.61–1.24)
Chloride: 108 mmol/L (ref 101–111)
GFR calc Af Amer: 50 mL/min — ABNORMAL LOW (ref >60)
GFR, EST NON AFRICAN AMERICAN: 43 mL/min — AB (ref >60)
GLUCOSE: 101 mg/dL — AB (ref 65–99)
POTASSIUM: 3.8 mmol/L (ref 3.5–5.1)
SODIUM: 139 mmol/L (ref 135–145)

## 2017-08-19 LAB — URINE CULTURE: Culture: 20000 — AB

## 2017-08-19 LAB — CBC
HCT: 31.5 % — ABNORMAL LOW (ref 40.0–52.0)
Hemoglobin: 10.9 g/dL — ABNORMAL LOW (ref 13.0–18.0)
MCH: 33.6 pg (ref 26.0–34.0)
MCHC: 34.7 g/dL (ref 32.0–36.0)
MCV: 96.9 fL (ref 80.0–100.0)
PLATELETS: 144 10*3/uL — AB (ref 150–440)
RBC: 3.25 MIL/uL — AB (ref 4.40–5.90)
RDW: 13.7 % (ref 11.5–14.5)
WBC: 9.5 10*3/uL (ref 3.8–10.6)

## 2017-08-19 MED ORDER — CIPROFLOXACIN HCL 500 MG PO TABS: 500.0000 mg | ORAL_TABLET | Freq: Two times a day (BID) | ORAL | 0 refills | Status: DC

## 2017-08-19 NOTE — Discharge Summary (Signed)
[place] at [place] NAME: Todd Rojas    MR#:  [id number]  DATE OF BIRTH:  March 08, 1941  DATE OF ADMISSION:  08/16/2017   ADMITTING PHYSICIAN: Fritzi Mandes, MD  DATE OF DISCHARGE: 08/19/2017 PRIMARY CARE PHYSICIAN: Tracie Harrier, MD   ADMISSION DIAGNOSIS:  Sepsis, due to unspecified organism ([place]) [A41.9] DISCHARGE DIAGNOSIS:  Active Problems:   Sepsis ([place])   Acute renal failure with tubular necrosis ([place])   Gross hematuria  SECONDARY DIAGNOSIS:   Past Medical History:  Diagnosis Date  . Asthma   . CAD (coronary artery disease) 02/13/2012   Overview:   12/2004 - Anginal equivalent symptoms:  Left arm pain, weakness, and shortness of breath.   12/2004:  Anterior STEMI.  Transferred to [facility].   01/06/2005 - Cardiac cath:  EF 51%, an occluded mid LAD and a large OM2 with 75% ostial stenosis.  PCI of the mid occluded LAD using a Cypher stent.   03/2005 - 2D echo:  EF >55%, LAE 4.7 cm, no significant valvular disease.   02/2008:  Plavix stopped.   06/2008:  Recurrent angina - symptoms of chest tightness.  Presented to [facility].  Cardiac catheterization.  The patient stated nonobstructive CAD seen.  Plavix restarted at that time.   03/2009 - 2D echo:  EF 35%, akinetic apex and septum.  Stress echocardiogram deferred for cardiac catheterization.   03/30/2009:  PCI 90% proximal LAD with a 3.0 x 18 mm Xience DES.   02/2010: Slow and gradual return of chest tightness, fatigue and generalized weakness concerning for return of anginal equivalent.  Stress test did not show ischemia to the heart muscle during exercise.  [place]  . CHF (congestive heart failure) ([place])   . COPD (chronic obstructive pulmonary disease) ([place]) 07/03/2017  . Dyslipidemia 02/13/2012   Overview:   03/2009  TC 158, TG 88, HDL 39, LDL 102.  11/2013  TC 148, TG 115, HDL 44.3, LDL 80.7 (done at PCP's office)   10/2014 TC 146  11/2015 TC 123, TG 86, HDL 41.7,  LDL 64  11/2016 TC 140, TG 129, HDL 40.1, LDL 74   . Dyspnea   . GERD (gastroesophageal reflux disease)   . GI disease 03/09/2013   Overview:   Dysphagia - EGD demonstrated esophageal stricture which was dilated   GERD   Hemorrhoids   . Hearing loss 03/09/2013  . Hyperlipidemia, unspecified 07/03/2017  . Hypertension 02/13/2012  . Myocardial infarction ([place])   . Personal history of gout 12/09/2016  . Primary osteoarthritis of both knees 05/05/2015  . Right-sided low back pain without sciatica 04/14/2016   HOSPITAL COURSE:   Todd Rojas a 76 y.o. 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 Patient also had an episode of vomiting today.  1. Sepsis due to UTI with recent left ureteral distal stent placement and ongoing issues with urinary retention and hematuria.  Discontinue IV Zosyn,  Change to [place] due to positive blood culture and urine culture for Pseudomonas. sensitive to cipro. Per Dr. Ola Spurr, Continue ceftazidime pending final urine cultures. Will add cipro as well empirically to increase likelihood that the Pseudomonas is appropriately covered due to the bacteremia and sepsis. should be 7- 10 days after removal of the ureteral stent.   2. Acute renal failure due to ATN from sepsis -Baseline creatinine1.2 in  July 2018 -Presented with crit of 2.69. Renal function is improving with IV fluid support and follow-up BMP as outpatient. Hold lisinopril.  3. Hypotension secondary to #1 Given gentle IV fluids, hold lisinopril. -Patient has history of cardio myopathy. Watch for signs of volume overload.  4. CAD Can resume Xarelto per Dr. Pilar Jarvis. Elevated troponin due to demanding ischemia.  5. COPD. Stable, nebulizer when necessary.   Diarrhea. Possible due to UTI and sepsis. resovled.  DISCHARGE CONDITIONS:  Stable,  discharge to home today. CONSULTS OBTAINED:  Treatment Team:  Hollice Espy, MD Leonel Ramsay, MD DRUG ALLERGIES:  No Known Allergies DISCHARGE MEDICATIONS:   Allergies as of 08/19/2017   No Known Allergies     Medication List    STOP taking these medications   lisinopril 40 MG tablet Commonly known as:  PRINIVIL,ZESTRIL   sulfamethoxazole-trimethoprim 800-160 MG tablet Commonly known as:  BACTRIM DS,SEPTRA DS     TAKE these medications   acetaminophen 500 MG tablet Commonly known as:  TYLENOL Take 1,000 mg by mouth every 8 (eight) hours as needed for mild pain.   allopurinol 300 MG tablet Commonly known as:  ZYLOPRIM Take 300 mg by mouth daily.   ciprofloxacin 500 MG tablet Commonly known as:  CIPRO Take 1 tablet (500 mg total) by mouth 2 (two) times daily.   vitamin B-12 1000 MCG tablet Commonly known as:  CYANOCOBALAMIN Take 1,000 mcg by mouth daily.   cyanocobalamin 1000 MCG/ML injection Commonly known as:  (VITAMIN B-12) Inject 1,000 mcg into the muscle every 30 (thirty) days.   HYDROcodone-acetaminophen 5-325 MG tablet Commonly known as:  NORCO Take 1 tablet by mouth every 4 (four) hours as needed for moderate pain.   pravastatin 80 MG tablet Commonly known as:  PRAVACHOL Take 80 mg by mouth every evening.   RIVAROXABAN PO STUDY DRUG - 2.5mg  twice daily   UNABLE TO FIND Take 100 mg by mouth daily. Study drug: Aspirin 100 mg  Dr Posey Pronto @@ [facility] is monitoring this study   VANISHPOINT TUBERCULIN SYRINGE 25G X 1" 1 ML Misc Generic drug:  Tuberculin-Allergy Syringes U UTD            Discharge Care Instructions        Start     Ordered   08/19/17 0000  Increase activity slowly     08/19/17 0907   08/19/17 0000  Diet - low sodium heart healthy     08/19/17 0907   08/19/17 0000  ciprofloxacin (CIPRO) 500 MG tablet  2 times daily     08/19/17 0912   08/18/17 0000  Increase activity slowly     08/18/17 1052   08/18/17 0000  Diet - low  sodium heart healthy     08/18/17 1052       DISCHARGE INSTRUCTIONS:  See AVS.  If you experience worsening of your admission symptoms, develop shortness of breath, life threatening emergency, suicidal or homicidal thoughts you must seek medical attention immediately by calling 911 or calling your MD immediately  if symptoms less severe.  You Must read complete instructions/literature along with all the possible adverse reactions/side effects for all the Medicines you take and that have been prescribed to you. Take any new Medicines after you have completely understood and accpet all the possible adverse reactions/side effects.   Please note  You were cared for by a hospitalist during your hospital stay. If you have any questions about your discharge medications or the care you received while  you were in the hospital after you are discharged, you can call the unit and asked to speak with the hospitalist on call if the hospitalist that took care of you is not available. Once you are discharged, your primary care physician will handle any further medical issues. Please note that NO REFILLS for any discharge medications will be authorized once you are discharged, as it is imperative that you return to your primary care physician (or establish a relationship with a primary care physician if you do not have one) for your aftercare needs so that they can reassess your need for medications and monitor your lab values.    On the day of Discharge:  VITAL SIGNS:  Blood pressure (!) 141/74, pulse 71, temperature (!) 97.5 F (36.4 C), temperature source Axillary, resp. rate 16, height 5\' 9"  (1.753 m), weight 199 lb (90.3 kg), SpO2 94 %. PHYSICAL EXAMINATION:  GENERAL:  76 y.o.-year-old patient lying in the bed with no acute distress.  EYES: Pupils equal, round, reactive to light and accommodation. No scleral icterus. Extraocular muscles intact.  HEENT: Head atraumatic, normocephalic. Oropharynx and  nasopharynx clear.  NECK:  Supple, no jugular venous distention. No thyroid enlargement, no tenderness.  LUNGS: Normal breath sounds bilaterally, no wheezing, rales,rhonchi or crepitation. No use of accessory muscles of respiration.  CARDIOVASCULAR: S1, S2 normal. No murmurs, rubs, or gallops.  ABDOMEN: Soft, non-tender, non-distended. Bowel sounds present. No organomegaly or mass.  EXTREMITIES: No pedal edema, cyanosis, or clubbing.  NEUROLOGIC: Cranial nerves II through XII are intact. Muscle strength 5/5 in all extremities. Sensation intact. Gait not checked.  PSYCHIATRIC: The patient is alert and oriented x 3.  SKIN: No obvious rash, lesion, or ulcer.  DATA REVIEW:   CBC  Recent Labs Lab 08/19/17 0514  WBC 9.5  HGB 10.9*  HCT 31.5*  PLT 144*    Chemistries   Recent Labs Lab 08/16/17 1150  08/19/17 0514  NA 133*  < > 139  K 4.0  < > 3.8  CL 100*  < > 108  CO2 22  < > 27  GLUCOSE 182*  < > 101*  BUN 43*  < > 28*  CREATININE 2.69*  < > 1.51*  CALCIUM 8.0*  < > 7.8*  AST 35  --   --   ALT 20  --   --   ALKPHOS 42  --   --   BILITOT 0.9  --   --   < > = values in this interval not displayed.   Microbiology Results  Results for orders placed or performed during the hospital encounter of 08/16/17  Blood culture (routine x 2)     Status: None (Preliminary result)   Collection Time: 08/16/17 12:03 PM  Result Value Ref Range Status   Specimen Description BLOOD RIGHT ANTECUBITAL  Final   Special Requests   Final    BOTTLES DRAWN AEROBIC AND ANAEROBIC Blood Culture results may not be optimal due to an excessive volume of blood received in culture bottles   Culture NO GROWTH 3 DAYS  Final   Report Status PENDING  Incomplete  Blood culture (routine x 2)     Status: Abnormal (Preliminary result)   Collection Time: 08/16/17 12:04 PM  Result Value Ref Range Status   Specimen Description BLOOD BLOOD LEFT HAND  Final   Special Requests   Final    BOTTLES DRAWN AEROBIC AND  ANAEROBIC Blood Culture adequate volume   Culture  Setup Time  Final    GRAM NEGATIVE RODS AEROBIC BOTTLE ONLY CRITICAL RESULT CALLED TO, READ BACK BY AND VERIFIED WITH: CHRISTINE KATSOUDAS AT 4782 ON 08/18/17 [place].    Culture (A)  Final    PSEUDOMONAS AERUGINOSA SUSCEPTIBILITIES TO FOLLOW Performed at [facility], [place] [address], [place], [place] [address]    Report Status PENDING  Incomplete  Blood Culture ID Panel (Reflexed)     Status: Abnormal   Collection Time: 08/16/17 12:04 PM  Result Value Ref Range Status   Enterococcus species NOT DETECTED NOT DETECTED Final   Listeria monocytogenes NOT DETECTED NOT DETECTED Final   Staphylococcus species NOT DETECTED NOT DETECTED Final   Staphylococcus aureus NOT DETECTED NOT DETECTED Final   Streptococcus species NOT DETECTED NOT DETECTED Final   Streptococcus agalactiae NOT DETECTED NOT DETECTED Final   Streptococcus pneumoniae NOT DETECTED NOT DETECTED Final   Streptococcus pyogenes NOT DETECTED NOT DETECTED Final   Acinetobacter baumannii NOT DETECTED NOT DETECTED Final   Enterobacteriaceae species NOT DETECTED NOT DETECTED Final   Enterobacter cloacae complex NOT DETECTED NOT DETECTED Final   Escherichia coli NOT DETECTED NOT DETECTED Final   Klebsiella oxytoca NOT DETECTED NOT DETECTED Final   Klebsiella pneumoniae NOT DETECTED NOT DETECTED Final   Proteus species NOT DETECTED NOT DETECTED Final   Serratia marcescens NOT DETECTED NOT DETECTED Final   Carbapenem resistance NOT DETECTED NOT DETECTED Final   Haemophilus influenzae NOT DETECTED NOT DETECTED Final   Neisseria meningitidis NOT DETECTED NOT DETECTED Final   Pseudomonas aeruginosa DETECTED (A) NOT DETECTED Final    Comment: CRITICAL RESULT CALLED TO, READ BACK BY AND VERIFIED WITH: CHRISTINE KATSOUDAS AT 1112 ON 08/18/17 [place].    Candida albicans NOT DETECTED NOT DETECTED Final   Candida glabrata NOT DETECTED NOT DETECTED Final   Candida krusei NOT DETECTED NOT  DETECTED Final   Candida parapsilosis NOT DETECTED NOT DETECTED Final   Candida tropicalis NOT DETECTED NOT DETECTED Final  Urine culture     Status: Abnormal   Collection Time: 08/16/17  2:45 PM  Result Value Ref Range Status   Specimen Description URINE, RANDOM  Final   Special Requests NONE  Final   Culture 20,000 COLONIES/mL PSEUDOMONAS AERUGINOSA (A)  Final   Report Status 08/19/2017 FINAL  Final   Organism ID, Bacteria PSEUDOMONAS AERUGINOSA (A)  Final      Susceptibility   Pseudomonas aeruginosa - MIC*    CEFTAZIDIME 4 SENSITIVE Sensitive     CIPROFLOXACIN <=0.25 SENSITIVE Sensitive     GENTAMICIN <=1 SENSITIVE Sensitive     IMIPENEM 2 SENSITIVE Sensitive     PIP/TAZO 8 SENSITIVE Sensitive     CEFEPIME 2 SENSITIVE Sensitive     * 20,000 COLONIES/mL PSEUDOMONAS AERUGINOSA  C difficile quick scan w PCR reflex     Status: None   Collection Time: 08/17/17  6:00 PM  Result Value Ref Range Status   C Diff antigen NEGATIVE NEGATIVE Final   C Diff toxin NEGATIVE NEGATIVE Final   C Diff interpretation No C. difficile detected.  Final    RADIOLOGY:  No results found.   Management plans discussed with the patient, his wife and they are in agreement.  CODE STATUS: Full Code   TOTAL TIME TAKING CARE OF THIS PATIENT: 33 minutes.    Demetrios Loll M.D on 08/19/2017 at 11:32 AM  Between 7am to 6pm - Pager - [phone]  After 6pm go to [email or web address] - [facility]  Office  [phone]  CC: Primary care physician; Tracie Harrier, MD   Note: This dictation was prepared with Dragon dictation along with smaller phrase technology. Any transcriptional errors that result from this process are unintentional.

## 2017-08-20 LAB — CULTURE, BLOOD (ROUTINE X 2): Special Requests: ADEQUATE

## 2017-08-22 ENCOUNTER — Telehealth: Payer: Self-pay

## 2017-08-22 ENCOUNTER — Ambulatory Visit: Payer: Medicare Other | Admitting: Urology

## 2017-08-22 LAB — CULTURE, BLOOD (ROUTINE X 2)

## 2017-08-22 NOTE — Telephone Encounter (Signed)
Is pt aware of this?

## 2017-08-22 NOTE — Telephone Encounter (Signed)
-----   Message from Nori Riis, PA-C sent at 08/18/2017  4:38 PM EDT ----- Patient in hospital.    Todd Rojas has recommended keeping the patient on Cipro when he is discharged.

## 2017-08-23 ENCOUNTER — Ambulatory Visit (INDEPENDENT_AMBULATORY_CARE_PROVIDER_SITE_OTHER): Payer: Medicare Other | Admitting: Urology

## 2017-08-23 ENCOUNTER — Encounter: Payer: Self-pay | Admitting: Urology

## 2017-08-23 VITALS — BP 108/55 | HR 82 | Ht 69.0 in | Wt 196.0 lb

## 2017-08-23 DIAGNOSIS — N201 Calculus of ureter: Secondary | ICD-10-CM

## 2017-08-23 DIAGNOSIS — N2889 Other specified disorders of kidney and ureter: Secondary | ICD-10-CM

## 2017-08-23 LAB — URINALYSIS, COMPLETE
Bilirubin, UA: NEGATIVE
Glucose, UA: NEGATIVE
Ketones, UA: NEGATIVE
Nitrite, UA: NEGATIVE
PH UA: 5.5 (ref 5.0–7.5)
Specific Gravity, UA: 1.025 (ref 1.005–1.030)
Urobilinogen, Ur: 0.2 mg/dL (ref 0.2–1.0)

## 2017-08-23 LAB — MICROSCOPIC EXAMINATION
EPITHELIAL CELLS (NON RENAL): NONE SEEN /HPF (ref 0–10)
RBC, UA: >30 /hpf — ABNORMAL HIGH (ref 0–?)

## 2017-08-23 MED ORDER — CIPROFLOXACIN HCL 500 MG PO TABS: 500.0000 mg | ORAL_TABLET | Freq: Once | ORAL | Status: DC

## 2017-08-23 MED ORDER — LIDOCAINE HCL 2 % EX GEL
1.0000 "application " | Freq: Once | CUTANEOUS | Status: AC
  Administered 2017-08-23: 1 via URETHRAL

## 2017-08-23 NOTE — Progress Notes (Signed)
   08/23/17  CC:  Chief Complaint  Patient presents with  . Cysto Stent Removal    HPI: The patient is 76 year old gentleman who presents today for left renal stent removal. He underwent recent incision of left ureterocele, laser lithotripsy, and stone removal of the distal 1 cm left ureteral stone and a left 1 cm renal stone. His postoperative course was complicated by urinary retention and eventually sepsis secondary to left pyelonephritis requiring hospitalization. Blood in urine cultures did show pan sensitive pseudomonas. He has been on the extended course of Cipro for this.  There were no vitals taken for this visit. NED. A&Ox3.   No respiratory distress   Abd soft, NT, ND Normal phallus with bilateral descended testicles  Cystoscopy Procedure Note  Patient identification was confirmed, informed consent was obtained, and patient was prepped using Betadine solution.  Lidocaine jelly was administered per urethral meatus.    Preoperative abx where received prior to procedure.     Pre-Procedure: - Inspection reveals a normal caliber ureteral meatus.  Procedure: The flexible cystoscope was introduced without difficulty - No urethral strictures/lesions are present. -Left ureteral stent removed intact with flexible graspers.   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 We will have him follow-up in one month with a renal ultrasound prior to rule out iatrogenic hydronephrosis. We'll discuss his stone analysis at that time when it's available. He'll finish his course of antibiotics which he has already been on almost a week.

## 2017-09-08 ENCOUNTER — Encounter: Payer: Self-pay | Admitting: Emergency Medicine

## 2017-09-08 ENCOUNTER — Inpatient Hospital Stay
Admission: EM | Admit: 2017-09-08 | Discharge: 2017-09-11 | DRG: 872 | Disposition: A | Discharge status: Home / Self Care | Payer: Medicare Other | Attending: Specialist | Admitting: Specialist

## 2017-09-08 ENCOUNTER — Emergency Department: Payer: Medicare Other

## 2017-09-08 DIAGNOSIS — N390 Urinary tract infection, site not specified: Secondary | ICD-10-CM | POA: Diagnosis present

## 2017-09-08 DIAGNOSIS — M109 Gout, unspecified: Secondary | ICD-10-CM | POA: Diagnosis present

## 2017-09-08 DIAGNOSIS — I509 Heart failure, unspecified: Secondary | ICD-10-CM | POA: Diagnosis present

## 2017-09-08 DIAGNOSIS — N179 Acute kidney failure, unspecified: Secondary | ICD-10-CM | POA: Diagnosis present

## 2017-09-08 DIAGNOSIS — H9190 Unspecified hearing loss, unspecified ear: Secondary | ICD-10-CM | POA: Diagnosis present

## 2017-09-08 DIAGNOSIS — A419 Sepsis, unspecified organism: Principal | ICD-10-CM | POA: Diagnosis present

## 2017-09-08 DIAGNOSIS — I252 Old myocardial infarction: Secondary | ICD-10-CM | POA: Diagnosis not present

## 2017-09-08 DIAGNOSIS — I2510 Atherosclerotic heart disease of native coronary artery without angina pectoris: Secondary | ICD-10-CM | POA: Diagnosis present

## 2017-09-08 DIAGNOSIS — J9811 Atelectasis: Secondary | ICD-10-CM | POA: Diagnosis present

## 2017-09-08 DIAGNOSIS — I110 Hypertensive heart disease with heart failure: Secondary | ICD-10-CM | POA: Diagnosis present

## 2017-09-08 DIAGNOSIS — E872 Acidosis: Secondary | ICD-10-CM | POA: Diagnosis present

## 2017-09-08 DIAGNOSIS — Z96653 Presence of artificial knee joint, bilateral: Secondary | ICD-10-CM | POA: Diagnosis present

## 2017-09-08 DIAGNOSIS — K219 Gastro-esophageal reflux disease without esophagitis: Secondary | ICD-10-CM | POA: Diagnosis present

## 2017-09-08 DIAGNOSIS — R7989 Other specified abnormal findings of blood chemistry: Secondary | ICD-10-CM

## 2017-09-08 DIAGNOSIS — R509 Fever, unspecified: Secondary | ICD-10-CM | POA: Diagnosis present

## 2017-09-08 DIAGNOSIS — E785 Hyperlipidemia, unspecified: Secondary | ICD-10-CM | POA: Diagnosis present

## 2017-09-08 DIAGNOSIS — Z87891 Personal history of nicotine dependence: Secondary | ICD-10-CM

## 2017-09-08 DIAGNOSIS — J440 Chronic obstructive pulmonary disease with acute lower respiratory infection: Secondary | ICD-10-CM | POA: Diagnosis present

## 2017-09-08 DIAGNOSIS — M545 Low back pain: Secondary | ICD-10-CM | POA: Diagnosis present

## 2017-09-08 DIAGNOSIS — R778 Other specified abnormalities of plasma proteins: Secondary | ICD-10-CM

## 2017-09-08 LAB — URINALYSIS, COMPLETE (UACMP) WITH MICROSCOPIC
BILIRUBIN URINE: NEGATIVE
Glucose, UA: NEGATIVE mg/dL
KETONES UR: NEGATIVE mg/dL
Nitrite: NEGATIVE
Protein, ur: 100 mg/dL — AB
SPECIFIC GRAVITY, URINE: 1.018 (ref 1.005–1.030)
pH: 5 (ref 5.0–8.0)

## 2017-09-08 LAB — HEPATIC FUNCTION PANEL
ALK PHOS: 49 U/L (ref 38–126)
ALT: 15 U/L — ABNORMAL LOW (ref 17–63)
AST: 18 U/L (ref 15–41)
Albumin: 3.6 g/dL (ref 3.5–5.0)
BILIRUBIN TOTAL: 0.8 mg/dL (ref 0.3–1.2)
Total Protein: 7 g/dL (ref 6.5–8.1)

## 2017-09-08 LAB — PROCALCITONIN

## 2017-09-08 LAB — PROTIME-INR
INR: 1.3
PROTHROMBIN TIME: 16.1 s — AB (ref 11.4–15.2)

## 2017-09-08 LAB — BASIC METABOLIC PANEL
ANION GAP: 7 (ref 5–15)
BUN: 18 mg/dL (ref 6–20)
CHLORIDE: 108 mmol/L (ref 101–111)
CO2: 26 mmol/L (ref 22–32)
Calcium: 8.8 mg/dL — ABNORMAL LOW (ref 8.9–10.3)
Creatinine, Ser: 1.41 mg/dL — ABNORMAL HIGH (ref 0.61–1.24)
GFR calc non Af Amer: 47 mL/min — ABNORMAL LOW (ref >60)
GFR, EST AFRICAN AMERICAN: 54 mL/min — AB (ref >60)
GLUCOSE: 102 mg/dL — AB (ref 65–99)
Potassium: 4.1 mmol/L (ref 3.5–5.1)
Sodium: 141 mmol/L (ref 135–145)

## 2017-09-08 LAB — LACTIC ACID, PLASMA
Lactic Acid, Venous: 2.2 mmol/L (ref 0.5–1.9)
Lactic Acid, Venous: 1.3 mmol/L (ref 0.5–1.9)

## 2017-09-08 LAB — TROPONIN I
TROPONIN I: 0.11 ng/mL — AB (ref <0.03)
TROPONIN I: 0.1 ng/mL — AB (ref <0.03)
TROPONIN I: 0.1 ng/mL — AB (ref <0.03)

## 2017-09-08 LAB — MRSA PCR SCREENING: MRSA by PCR: NEGATIVE

## 2017-09-08 LAB — CBC
HEMATOCRIT: 35.7 % — AB (ref 40.0–52.0)
HEMOGLOBIN: 12 g/dL — AB (ref 13.0–18.0)
MCH: 32.2 pg (ref 26.0–34.0)
MCHC: 33.6 g/dL (ref 32.0–36.0)
MCV: 95.8 fL (ref 80.0–100.0)
Platelets: 205 10*3/uL (ref 150–440)
RBC: 3.72 MIL/uL — AB (ref 4.40–5.90)
RDW: 13.9 % (ref 11.5–14.5)
WBC: 12.5 10*3/uL — ABNORMAL HIGH (ref 3.8–10.6)

## 2017-09-08 LAB — LIPASE, BLOOD: Lipase: 23 U/L (ref 11–51)

## 2017-09-08 MED ORDER — PRAVASTATIN SODIUM 20 MG PO TABS
80.0000 mg | ORAL_TABLET | Freq: Every evening | ORAL | Status: DC
  Administered 2017-09-08 – 2017-09-10 (×3): 80 mg via ORAL
  Filled 2017-09-08 (×3): qty 4

## 2017-09-08 MED ORDER — CYANOCOBALAMIN 1000 MCG/ML IJ SOLN: 1000.0000 ug | INTRAMUSCULAR | Status: DC

## 2017-09-08 MED ORDER — ACETAMINOPHEN 500 MG PO TABS
1000.0000 mg | ORAL_TABLET | Freq: Once | ORAL | Status: AC
Start: 2017-09-08 — End: 2017-09-08
  Administered 2017-09-08: 1000 mg via ORAL
  Filled 2017-09-08: qty 2

## 2017-09-08 MED ORDER — VANCOMYCIN HCL 10 G IV SOLR
1500.0000 mg | Freq: Once | INTRAVENOUS | Status: AC
  Administered 2017-09-08: 1500 mg via INTRAVENOUS
  Filled 2017-09-08: qty 1500

## 2017-09-08 MED ORDER — ACETAMINOPHEN 650 MG RE SUPP: 650.0000 mg | Freq: Four times a day (QID) | RECTAL | Status: DC | PRN

## 2017-09-08 MED ORDER — SENNOSIDES-DOCUSATE SODIUM 8.6-50 MG PO TABS: 1.0000 | ORAL_TABLET | Freq: Every evening | ORAL | Status: DC | PRN

## 2017-09-08 MED ORDER — ONDANSETRON HCL 4 MG PO TABS: 4.0000 mg | ORAL_TABLET | Freq: Four times a day (QID) | ORAL | Status: DC | PRN

## 2017-09-08 MED ORDER — SODIUM CHLORIDE 0.9 % IV SOLN
INTRAVENOUS | Status: DC
  Administered 2017-09-08: 1000 mL via INTRAVENOUS
  Administered 2017-09-09: 06:00:00 via INTRAVENOUS

## 2017-09-08 MED ORDER — DEXTROSE 5 % IV SOLN
1.0000 g | Freq: Once | INTRAVENOUS | Status: AC
  Administered 2017-09-08: 1 g via INTRAVENOUS
  Filled 2017-09-08: qty 1

## 2017-09-08 MED ORDER — DEXTROSE 5 % IV SOLN
1.0000 g | INTRAVENOUS | Status: DC
  Administered 2017-09-09: 1 g via INTRAVENOUS
  Filled 2017-09-08: qty 1

## 2017-09-08 MED ORDER — HYDROCODONE-ACETAMINOPHEN 5-325 MG PO TABS
1.0000 | ORAL_TABLET | ORAL | Status: DC | PRN
  Administered 2017-09-08 – 2017-09-09 (×2): 1 via ORAL
  Filled 2017-09-08 (×2): qty 1

## 2017-09-08 MED ORDER — HEPARIN SODIUM (PORCINE) 5000 UNIT/ML IJ SOLN
5000.0000 [IU] | Freq: Three times a day (TID) | INTRAMUSCULAR | Status: DC
  Administered 2017-09-08 – 2017-09-11 (×8): 5000 [IU] via SUBCUTANEOUS
  Filled 2017-09-08 (×9): qty 1

## 2017-09-08 MED ORDER — ACETAMINOPHEN 325 MG PO TABS
650.0000 mg | ORAL_TABLET | Freq: Four times a day (QID) | ORAL | Status: DC | PRN
  Administered 2017-09-10 (×2): 650 mg via ORAL
  Filled 2017-09-08 (×3): qty 2

## 2017-09-08 MED ORDER — ALLOPURINOL 300 MG PO TABS
300.0000 mg | ORAL_TABLET | Freq: Every day | ORAL | Status: DC
  Administered 2017-09-09 – 2017-09-11 (×3): 300 mg via ORAL
  Filled 2017-09-08: qty 3
  Filled 2017-09-08: qty 1
  Filled 2017-09-08 (×2): qty 3
  Filled 2017-09-08 (×2): qty 1

## 2017-09-08 MED ORDER — VANCOMYCIN HCL 10 G IV SOLR
1250.0000 mg | INTRAVENOUS | Status: DC
  Administered 2017-09-09: 1250 mg via INTRAVENOUS
  Filled 2017-09-08 (×2): qty 1250

## 2017-09-08 MED ORDER — SODIUM CHLORIDE 0.9 % IV BOLUS (SEPSIS)
30.0000 mL/kg | Freq: Once | INTRAVENOUS | Status: AC
  Administered 2017-09-08: 2721 mL via INTRAVENOUS

## 2017-09-08 MED ORDER — CIPROFLOXACIN IN D5W 400 MG/200ML IV SOLN
400.0000 mg | Freq: Once | INTRAVENOUS | Status: AC
  Administered 2017-09-08: 400 mg via INTRAVENOUS
  Filled 2017-09-08: qty 200

## 2017-09-08 MED ORDER — ALBUTEROL SULFATE (2.5 MG/3ML) 0.083% IN NEBU
2.5000 mg | INHALATION_SOLUTION | RESPIRATORY_TRACT | Status: DC | PRN
  Administered 2017-09-08: 2.5 mg via RESPIRATORY_TRACT
  Filled 2017-09-08 (×2): qty 3

## 2017-09-08 MED ORDER — BISACODYL 5 MG PO TBEC: 5.0000 mg | DELAYED_RELEASE_TABLET | Freq: Every day | ORAL | Status: DC | PRN

## 2017-09-08 MED ORDER — ASPIRIN 81 MG PO CHEW
81.0000 mg | CHEWABLE_TABLET | Freq: Every day | ORAL | Status: DC
  Administered 2017-09-08: 81 mg via ORAL
  Filled 2017-09-08 (×2): qty 1

## 2017-09-08 MED ORDER — ONDANSETRON HCL 4 MG/2ML IJ SOLN
4.0000 mg | Freq: Four times a day (QID) | INTRAMUSCULAR | Status: DC | PRN
  Administered 2017-09-09: 4 mg via INTRAVENOUS
  Filled 2017-09-08: qty 2

## 2017-09-08 MED ORDER — DEXTROSE 5 % IV SOLN
1.0000 g | Freq: Two times a day (BID) | INTRAVENOUS | Status: DC
  Filled 2017-09-08: qty 1

## 2017-09-08 NOTE — ED Notes (Signed)
Date and time results received: 09/08/17 2:32 PM (use smartphrase ".now" to insert current time)  Test: Lactic Acid Critical Value: 2.2  Name of Provider Notified: Dr. Mable Paris  Orders Received? Or Actions Taken?: No new orders at this time.

## 2017-09-08 NOTE — ED Provider Notes (Signed)
[facility] Emergency Department Provider Note  ____________________________________________   First MD Initiated Contact with Patient 09/08/17 1350     (approximate)  I have reviewed the triage vital signs and the nursing notes.   HISTORY  Chief Complaint Weakness   HPI Todd Rojas is a 76 y.o. male who comes to the emergency department with fevers chills and malaise that began last night. He denies cough or shortness of breath. He does have mild diffuse abdominal pain but most notably he does have moderate severity aching low back pain as well. He has a complex past medical history including COPD, coronary arterydisease, hypertension, and a recent admission to our hospital for pseudomonal sepsis from a urinary origin. He was discharged home 2 weeks ago but feels that he never fully recovered. His symptoms yesterday began rather suddenly and has been slowly progressive.  While waiting in the waiting room the patient became diaphoretic and had a syncopal event.   Past Medical History:  Diagnosis Date  . Asthma   . CAD (coronary artery disease) 02/13/2012   Overview:   12/2004 - Anginal equivalent symptoms:  Left arm pain, weakness, and shortness of breath.   12/2004:  Anterior STEMI.  Transferred to [facility].   01/06/2005 - Cardiac cath:  EF 51%, an occluded mid LAD and a large OM2 with 75% ostial stenosis.  PCI of the mid occluded LAD using a Cypher stent.   03/2005 - 2D echo:  EF >55%, LAE 4.7 cm, no significant valvular disease.   02/2008:  Plavix stopped.   06/2008:  Recurrent angina - symptoms of chest tightness.  Presented to [facility]  Cardiac catheterization.  The patient stated nonobstructive CAD seen.  Plavix restarted at that time.   03/2009 - 2D echo:  EF 35%, akinetic apex and septum.  Stress echocardiogram deferred for cardiac catheterization.   03/30/2009:  PCI 90% proximal LAD with a 3.0 x 18 mm Xience DES.   02/2010: Slow  and gradual return of chest tightness, fatigue and generalized weakness concerning for return of anginal equivalent.  Stress test did not show ischemia to the heart muscle during exercise.  [place]enc  . CHF (congestive heart failure) ([place])   . COPD (chronic obstructive pulmonary disease) ([place]) 07/03/2017  . Dyslipidemia 02/13/2012   Overview:   03/2009  TC 158, TG 88, HDL 39, LDL 102.  11/2013  TC 148, TG 115, HDL 44.3, LDL 80.7 (done at PCP's office)   10/2014 TC 146  11/2015 TC 123, TG 86, HDL 41.7, LDL 64  11/2016 TC 140, TG 129, HDL 40.1, LDL 74   . Dyspnea   . GERD (gastroesophageal reflux disease)   . GI disease 03/09/2013   Overview:   Dysphagia - EGD demonstrated esophageal stricture which was dilated   GERD   Hemorrhoids   . Hearing loss 03/09/2013  . Hyperlipidemia, unspecified 07/03/2017  . Hypertension 02/13/2012  . Myocardial infarction ([place])   . Personal history of gout 12/09/2016  . Primary osteoarthritis of both knees 05/05/2015  . Right-sided low back pain without sciatica 04/14/2016    Patient Active Problem List   Diagnosis Date Noted  . Sepsis ([place]) 08/16/2017  . Acute renal failure with tubular necrosis ([place])   . Gross hematuria   . COPD (chronic obstructive pulmonary disease) ([place]) 07/03/2017  . Hyperlipidemia, unspecified 07/03/2017  . Personal history of gout 12/09/2016  . Status post left unicompartmental knee replacement 11/03/2016  . Right-sided low back pain  without sciatica 04/14/2016  . Status post left partial knee replacement 12/23/2015  . Right hip pain 07/30/2015  . Status post right partial knee replacement 07/30/2015  . Primary osteoarthritis of both knees 05/05/2015  . 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  . GI disease 03/09/2013  . Hearing loss 03/09/2013  . CAD (coronary artery disease) 02/13/2012  . Dyslipidemia 02/13/2012  . History of tobacco abuse 02/13/2012  . Hypertension 02/13/2012    Past Surgical History:  Procedure Laterality Date  .  coronary stents    . CYSTOSCOPY WITH STENT PLACEMENT Left 08/09/2017   Procedure: CYSTOSCOPY WITH STENT PLACEMENT;  Surgeon: Nickie Retort, MD;  Location: [facility] ORS;  Service: Urology;  Laterality: Left;  Marland Kitchen MEDIAL PARTIAL KNEE REPLACEMENT Bilateral 2016  . URETEROSCOPY WITH HOLMIUM LASER LITHOTRIPSY Left 08/09/2017   Procedure: URETEROSCOPY WITH HOLMIUM LASER LITHOTRIPSY/INCISION OF URETEROCELE;  Surgeon: Nickie Retort, MD;  Location: [facility] ORS;  Service: Urology;  Laterality: Left;    Prior to Admission medications   Medication Sig Start Date End Date Taking? Authorizing Provider  albuterol (PROVENTIL HFA;VENTOLIN HFA) 108 (90 Base) MCG/ACT inhaler Inhale 1 puff into the lungs every 6 (six) hours as needed for wheezing or shortness of breath.   Yes [provider]  allopurinol (ZYLOPRIM) 300 MG tablet Take 300 mg by mouth daily.  01/31/17  Yes [provider]  cyanocobalamin (,VITAMIN B-12,) 1000 MCG/ML injection Inject 1,000 mcg into the muscle every 30 (thirty) days.  01/02/17  Yes [provider]  lisinopril (PRINIVIL,ZESTRIL) 40 MG tablet Take 40 mg by mouth daily.   Yes [provider]  pravastatin (PRAVACHOL) 80 MG tablet Take 80 mg by mouth every evening.  06/12/17  Yes [provider]  RIVAROXABAN PO STUDY DRUG - 2.5mg  twice daily   Yes [provider]  UNABLE TO FIND Take 100 mg by mouth daily. Study drug: Aspirin 100 mg  Dr Posey Pronto @@ [facility] is monitoring this study   Yes [provider]  vitamin B-12 (CYANOCOBALAMIN) 1000 MCG tablet Take 1,000 mcg by mouth daily.   Yes [provider]  acetaminophen (TYLENOL) 500 MG tablet Take 1,000 mg by mouth every 8 (eight) hours as needed for mild pain.    [provider]  ciprofloxacin (CIPRO) 500 MG tablet Take 1 tablet (500 mg total) by mouth 2 (two) times daily. Patient not taking: Reported on 09/08/2017 08/19/17   Demetrios Loll, MD  HYDROcodone-acetaminophen  [facility]) 5-325 MG tablet Take 1 tablet by mouth every 4 (four) hours as needed for moderate pain. Patient not taking: Reported on 09/08/2017 08/09/17   Nickie Retort, MD  VANISHPOINT TUBERCULIN SYRINGE 25G X 1" 1 ML MISC U UTD 07/05/17   [provider]    Allergies Patient has no known allergies.  Family History  Problem Relation Age of Onset  . Prostate cancer Neg Hx   . Kidney cancer Neg Hx   . Bladder Cancer Neg Hx     Social History Social History  Substance Use Topics  . Smoking status: Former Smoker    Quit date: 07/26/1985  . Smokeless tobacco: Never Used  . Alcohol use No    Review of Systems Constitutional: positive fevers and chills Eyes: No visual changes. ENT: No sore throat. Cardiovascular: Denies chest pain. Respiratory: Denies shortness of breath. Gastrointestinal: positive abdominal pain.  Positive nausea, no vomiting.  No diarrhea.  No constipation. Genitourinary: Negative for dysuria. Musculoskeletal: positive for back pain. Skin: Negative for rash. Neurological: Negative for  headaches, focal weakness or numbness.   ____________________________________________   PHYSICAL EXAM:  VITAL SIGNS: ED Triage Vitals [09/08/17 1305]  Enc Vitals Group     BP (!) 138/96     Pulse Rate 84     Resp 18     Temp 99.8 F (37.7 C)     Temp Source Oral     SpO2 97 %     Weight 200 lb (90.7 kg)     Height 5\' 9"  (1.753 m)     Head Circumference      Peak Flow      Pain Score      Pain Loc      Pain Edu?      Excl. in [place]?     Constitutional: significant right errors appears uncomfortable mild diaphoresis Eyes: PERRL EOMI. Head: Atraumatic. Nose: No congestion/rhinnorhea. Mouth/Throat: No trismus Neck: No stridor.   Cardiovascular: irregularly irregular normal rate no murmurs appreciated Respiratory: Normal respiratory effort.  No retractions. Lungs CTAB and moving good air Gastrointestinal: soft mild diffuse tenderness with no focality no  rebound or guarding no peritonitis Musculoskeletal: No lower extremity edema   Neurologic:  Normal speech and language. No gross focal neurologic deficits are appreciated. Skin:  Skin is warm, dry and intact. No rash noted. Psychiatric: Mood and affect are normal. Speech and behavior are normal.    ____________________________________________   DIFFERENTIAL includes but not limited to  sepsis, urinary tract infection, pyelonephritis, COPD exacerbation, bacteremia ____________________________________________   LABS (all labs ordered are listed, but only abnormal results are displayed)  Labs Reviewed  BASIC METABOLIC PANEL - Abnormal; Notable for the following:       Result Value   Glucose, Bld 102 (*)    Creatinine, Ser 1.41 (*)    Calcium 8.8 (*)    GFR calc non Af Amer 47 (*)    GFR calc Af Amer 54 (*)    All other components within normal limits  CBC - Abnormal; Notable for the following:    WBC 12.5 (*)    RBC 3.72 (*)    Hemoglobin 12.0 (*)    HCT 35.7 (*)    All other components within normal limits  URINALYSIS, COMPLETE (UACMP) WITH MICROSCOPIC - Abnormal; Notable for the following:    Color, Urine YELLOW (*)    APPearance HAZY (*)    Hgb urine dipstick SMALL (*)    Protein, ur 100 (*)    Leukocytes, UA MODERATE (*)    Bacteria, UA FEW (*)    Squamous Epithelial / LPF 0-5 (*)    All other components within normal limits  TROPONIN I - Abnormal; Notable for the following:    Troponin I 0.11 (*)    All other components within normal limits  LACTIC ACID, PLASMA - Abnormal; Notable for the following:    Lactic Acid, Venous 2.2 (*)    All other components within normal limits  HEPATIC FUNCTION PANEL - Abnormal; Notable for the following:    ALT 15 (*)    Bilirubin, Direct <0.1 (*)    All other components within normal limits  PROTIME-INR - Abnormal; Notable for the following:    Prothrombin Time 16.1 (*)    All other components within normal limits  CULTURE,  BLOOD (ROUTINE X 2)  CULTURE, BLOOD (ROUTINE X 2)  URINE CULTURE  LIPASE, BLOOD  PROCALCITONIN  LACTIC ACID, PLASMA    blood work reviewed by me shows an elevated white blood cell count as well  as an elevated lactic acid which are both concerning for bacterial infection. Elevated troponin is likely secondary to demand not true primary cardiac ischemia_____________________________________  EKG  ED ECG REPORT I, Darel Hong, the attending physician, personally viewed and interpreted this ECG.  Date: 09/08/2017 EKG Time:  Rate: 79 Rhythm: normal sinus rhythm QRS Axis: normal Intervals: normal ST/T Wave abnormalities: normal Narrative Interpretation: no evidence of acute ischemia  ____________________________________________  RADIOLOGY  chest x-ray concerning for right lower lobe effusion versus infiltrate ____________________________________________   PROCEDURES  Procedure(s) performed: no  Procedures  Critical Care performed: yes  CRITICAL CARE Performed by: Darel Hong   Total critical care time: 35 minutes  Critical care time was exclusive of separately billable procedures and treating other patients.  Critical care was necessary to treat or prevent imminent or life-threatening deterioration.  Critical care was time spent personally by me on the following activities: development of treatment plan with patient and/or surrogate as well as nursing, discussions with consultants, evaluation of patient's response to treatment, examination of patient, obtaining history from patient or surrogate, ordering and performing treatments and interventions, ordering and review of laboratory studies, ordering and review of radiographic studies, pulse oximetry and re-evaluation of patient's condition.   Observation: no ____________________________________________   INITIAL IMPRESSION / ASSESSMENT AND PLAN / ED COURSE  Pertinent labs & imaging results that were available  during my care of the patient were reviewed by me and considered in my medical decision making (see chart for details).  On arrival the patient has an oral temperature of 99.3, however he feels warmer than this. He is clearly having riders and appears uncomfortable. Rectal temperature conference 102.5. Broad septic workup initiated. He had a recent urine culture positive for pansensitive Pseudomonas arginyl so is and Dr. Ola Spurr recommended ciprofloxacin so I'll begin with that now. He will require inpatient admission for aggressive IV hydration as well as broad-spectrum antibiotics.  I had a lengthy discussion with patient and his wife at bedside and they both agree with the plan.     ----------------------------------------- 3:38 PM on 09/08/2017 -----------------------------------------  The patient's urinalysis is consistent with infection, however his chest x-ray likewise shows a right lower lobe infiltrate concerning for pneumonia. As the patient had a recent hospitalization concerned this could be hospital associated pneumonia so I'll broaden his coverage to cefepime as well as vancomycin in addition to the previously given ciprofloxacin. His urine infection should be sensitive to the cefepime and he will no longer requires Cipro. ____________________________________________   FINAL CLINICAL IMPRESSION(S) / ED DIAGNOSES  Final diagnoses:  Sepsis, due to unspecified organism ([place])  Elevated troponin      NEW MEDICATIONS STARTED DURING THIS VISIT:  New Prescriptions   No medications on file     Note:  This document was prepared using Dragon voice recognition software and may include unintentional dictation errors.     Darel Hong, MD 09/08/17 [phone]

## 2017-09-08 NOTE — Progress Notes (Signed)
Pharmacy Antibiotic Note  Todd Rojas is a 76 y.o. male admitted on 09/08/2017 with Pneumonia/UTI.  Pharmacy has been consulted for cefepime and vancomycin dosing.  Plan: Patient ordered vancomycin 1500mg  IV x 1. Will continue patient on vancomycin 1250mg  IV Q18hr for goal trough of 15-20.   Will initiate cefepime 1g IV Q24hr.   Procalcitonin is < 0.1 and MRSA PCR is pending. Will continue to assess for narrowing opportunities.   Height: 5\' 9"  (175.3 cm) Weight: 200 lb (90.7 kg) IBW/kg (Calculated) : 70.7  Temp (24hrs), Avg:99.6 F (37.6 C), Min:98.4 F (36.9 C), Max:102.5 F (39.2 C)   Recent Labs Lab 09/08/17 1312 09/08/17 1337 09/08/17 1757  WBC 12.5*  --   --   CREATININE 1.41*  --   --   LATICACIDVEN  --  2.2* 1.3    Estimated Creatinine Clearance: 49.6 mL/min (A) (by C-G formula based on SCr of 1.41 mg/dL (H)).    No Known Allergies  Antimicrobials this admission: Cipro 9/21 x 1  Cefepime 9/21 >>  Vancomycin 9/21 >>  Dose adjustments this admission: N/A  Microbiology results: 9/21 BCx: pending  9/21 UCx: pending 9/21 MRSA PCR: pending   Thank you for allowing pharmacy to be a part of this patient's care.  Keygan Dumond L 09/08/2017 7:09 PM

## 2017-09-08 NOTE — ED Notes (Signed)
Dr. Chen at bedside.

## 2017-09-08 NOTE — Progress Notes (Signed)
ANTICOAGULATION CONSULT NOTE - Initial Consult  Pharmacy Consult for Xarelto  Indication: a fib (?)   No Known Allergies  Patient Measurements: Height: 5\' 9"  (175.3 cm) Weight: 200 lb (90.7 kg) IBW/kg (Calculated) : 70.7 Heparin Dosing Weight:   Vital Signs: Temp: 98.8 F (37.1 C) (09/21 2053) Temp Source: Oral (09/21 2053) BP: 131/53 (09/21 2053) Pulse Rate: 93 (09/21 2053)  Labs:  Recent Labs  09/08/17 1312 09/08/17 1359 09/08/17 1746  HGB 12.0*  --   --   HCT 35.7*  --   --   PLT 205  --   --   LABPROT  --  16.1*  --   INR  --  1.30  --   CREATININE 1.41*  --   --   TROPONINI 0.11*  --  0.10*    Estimated Creatinine Clearance: 49.6 mL/min (A) (by C-G formula based on SCr of 1.41 mg/dL (H)).   Medical History: Past Medical History:  Diagnosis Date  . Asthma   . CAD (coronary artery disease) 02/13/2012   Overview:   12/2004 - Anginal equivalent symptoms:  Left arm pain, weakness, and shortness of breath.   12/2004:  Anterior STEMI.  Transferred to [facility].   01/06/2005 - Cardiac cath:  EF 51%, an occluded mid LAD and a large OM2 with 75% ostial stenosis.  PCI of the mid occluded LAD using a Cypher stent.   03/2005 - 2D echo:  EF >55%, LAE 4.7 cm, no significant valvular disease.   02/2008:  Plavix stopped.   06/2008:  Recurrent angina - symptoms of chest tightness.  Presented to [facility].  Cardiac catheterization.  The patient stated nonobstructive CAD seen.  Plavix restarted at that time.   03/2009 - 2D echo:  EF 35%, akinetic apex and septum.  Stress echocardiogram deferred for cardiac catheterization.   03/30/2009:  PCI 90% proximal LAD with a 3.0 x 18 mm Xience DES.   02/2010: Slow and gradual return of chest tightness, fatigue and generalized weakness concerning for return of anginal equivalent.  Stress test did not show ischemia to the heart muscle during exercise.  [place]  . CHF (congestive heart failure) ([place])   . COPD (chronic obstructive  pulmonary disease) ([place]) 07/03/2017  . Dyslipidemia 02/13/2012   Overview:   03/2009  TC 158, TG 88, HDL 39, LDL 102.  11/2013  TC 148, TG 115, HDL 44.3, LDL 80.7 (done at PCP's office)   10/2014 TC 146  11/2015 TC 123, TG 86, HDL 41.7, LDL 64  11/2016 TC 140, TG 129, HDL 40.1, LDL 74   . Dyspnea   . GERD (gastroesophageal reflux disease)   . GI disease 03/09/2013   Overview:   Dysphagia - EGD demonstrated esophageal stricture which was dilated   GERD   Hemorrhoids   . Hearing loss 03/09/2013  . Hyperlipidemia, unspecified 07/03/2017  . Hypertension 02/13/2012  . Myocardial infarction ([place])   . Personal history of gout 12/09/2016  . Primary osteoarthritis of both knees 05/05/2015  . Right-sided low back pain without sciatica 04/14/2016    Medications:  Prescriptions Prior to Admission  Medication Sig Dispense Refill Last Dose  . albuterol (PROVENTIL HFA;VENTOLIN HFA) 108 (90 Base) MCG/ACT inhaler Inhale 1 puff into the lungs every 6 (six) hours as needed for wheezing or shortness of breath.   prn at prn  . allopurinol (ZYLOPRIM) 300 MG tablet Take 300 mg by mouth daily.    09/08/2017 at 0800  . cyanocobalamin (,VITAMIN B-12,)  1000 MCG/ML injection Inject 1,000 mcg into the muscle every 30 (thirty) days.    Past Week at Unknown time  . lisinopril (PRINIVIL,ZESTRIL) 40 MG tablet Take 40 mg by mouth daily.   09/08/2017 at 0800  . pravastatin (PRAVACHOL) 80 MG tablet Take 80 mg by mouth every evening.    09/07/2017 at 2000  . RIVAROXABAN PO STUDY DRUG - 2.5mg  twice daily   09/08/2017 at 0800  . UNABLE TO FIND Take 100 mg by mouth daily. Study drug: Aspirin 100 mg  Dr Posey Pronto @@ [facility] is monitoring this study   09/07/2017 at 2000  . vitamin B-12 (CYANOCOBALAMIN) 1000 MCG tablet Take 1,000 mcg by mouth daily.   09/08/2017 at 0800  . acetaminophen (TYLENOL) 500 MG tablet Take 1,000 mg by mouth every 8 (eight) hours as needed for mild pain.   prn at prn  . ciprofloxacin (CIPRO) 500 MG tablet Take 1 tablet  (500 mg total) by mouth 2 (two) times daily. (Patient not taking: Reported on 09/08/2017) 20 tablet 0 Not Taking at Unknown time  . HYDROcodone-acetaminophen (NORCO) 5-325 MG tablet Take 1 tablet by mouth every 4 (four) hours as needed for moderate pain. (Patient not taking: Reported on 09/08/2017) 30 tablet 0 Not Taking at Unknown time  . VANISHPOINT TUBERCULIN SYRINGE 25G X 1" 1 ML MISC U UTD  5 Taking    Assessment:  Goal of Therapy:    Plan:  Pt was on Xarelto 2.5 mg PO BID at home as part of investigational drug study out of [facility].  Since we cannot provide 2.5 mg dose at [facility], advised RN to have pt bring own med from home.   Todd Rojas D 09/08/2017,9:31 PM

## 2017-09-08 NOTE — ED Notes (Signed)
Patient to room diaphoretic, states had an episode of vomiting, severe chills.

## 2017-09-08 NOTE — ED Triage Notes (Signed)
Patient presents to ED via POV from home with c/o generalized weakness and fevers. Wife reports highest fever at home was 100. Ibuprofen last given at 1000. Patient also reports chills. Denies CP, dizziness, N/V/D.

## 2017-09-08 NOTE — ED Notes (Signed)
Date and time results received: 09/08/17 2:24 PM (use smartphrase ".now" to insert current time)  Test: Troponin Critical Value: 0.11  Name of Provider Notified: Dr. Mable Paris  Orders Received? Or Actions Taken?: No new orders at this time.

## 2017-09-08 NOTE — H&P (Signed)
[place] at [place] NAME: Todd Rojas    MR#:  [id number]  DATE OF BIRTH:  1941/05/13  DATE OF ADMISSION:  09/08/2017  PRIMARY CARE PHYSICIAN: Tracie Harrier, MD   REQUESTING/REFERRING PHYSICIAN: Darel Hong, MD  CHIEF COMPLAINT:   Chief Complaint  Patient presents with  . Weakness   Fever, chills and generalized weakness since last night. HISTORY OF PRESENT ILLNESS:  Todd Rojas  is a 76 y.o. male with a known history of Sepsis, UTI, CHF, COPD, CAD, hypertension and hyperlipidemia. The patient was discharged to home after treatment of sepsis due to UTI 3 weeks ago. He presently ED with the above chief complaints. He complains of fever, chills and generalized weakness. According to his wife, the patient was found less responsive in the ED for a few seconds. He is alert and awake now. He has fever of 102, leukocytosis and lactic acidosis. Urinalysis show UTI and a chest x-ray show possible pneumonia. He is treated with cefepime and vancomycin in the ED.  PAST MEDICAL HISTORY:   Past Medical History:  Diagnosis Date  . Asthma   . CAD (coronary artery disease) 02/13/2012   Overview:   12/2004 - Anginal equivalent symptoms:  Left arm pain, weakness, and shortness of breath.   12/2004:  Anterior STEMI.  Transferred to [facility].   01/06/2005 - Cardiac cath:  EF 51%, an occluded mid LAD and a large OM2 with 75% ostial stenosis.  PCI of the mid occluded LAD using a Cypher stent.   03/2005 - 2D echo:  EF >55%, LAE 4.7 cm, no significant valvular disease.   02/2008:  Plavix stopped.   06/2008:  Recurrent angina - symptoms of chest tightness.  Presented to [facility].  Cardiac catheterization.  The patient stated nonobstructive CAD seen.  Plavix restarted at that time.   03/2009 - 2D echo:  EF 35%, akinetic apex and septum.  Stress echocardiogram deferred for cardiac catheterization.   03/30/2009:  PCI 90% proximal LAD with a  3.0 x 18 mm Xience DES.   02/2010: Slow and gradual return of chest tightness, fatigue and generalized weakness concerning for return of anginal equivalent.  Stress test did not show ischemia to the heart muscle during exercise.  [facility]  . CHF (congestive heart failure) ([place])   . COPD (chronic obstructive pulmonary disease) ([place]) 07/03/2017  . Dyslipidemia 02/13/2012   Overview:   03/2009  TC 158, TG 88, HDL 39, LDL 102.  11/2013  TC 148, TG 115, HDL 44.3, LDL 80.7 (done at PCP's office)   10/2014 TC 146  11/2015 TC 123, TG 86, HDL 41.7, LDL 64  11/2016 TC 140, TG 129, HDL 40.1, LDL 74   . Dyspnea   . GERD (gastroesophageal reflux disease)   . GI disease 03/09/2013   Overview:   Dysphagia - EGD demonstrated esophageal stricture which was dilated   GERD   Hemorrhoids   . Hearing loss 03/09/2013  . Hyperlipidemia, unspecified 07/03/2017  . Hypertension 02/13/2012  . Myocardial infarction ([place])   . Personal history of gout 12/09/2016  . Primary osteoarthritis of both knees 05/05/2015  . Right-sided low back pain without sciatica 04/14/2016    PAST SURGICAL HISTORY:   Past Surgical History:  Procedure Laterality Date  . coronary stents    . CYSTOSCOPY WITH STENT PLACEMENT Left 08/09/2017   Procedure: CYSTOSCOPY WITH STENT PLACEMENT;  Surgeon: Nickie Retort, MD;  Location: [facility] ORS;  Service: Urology;  Laterality: Left;  Marland Kitchen MEDIAL PARTIAL KNEE REPLACEMENT Bilateral 2016  . URETEROSCOPY WITH HOLMIUM LASER LITHOTRIPSY Left 08/09/2017   Procedure: URETEROSCOPY WITH HOLMIUM LASER LITHOTRIPSY/INCISION OF URETEROCELE;  Surgeon: Nickie Retort, MD;  Location: [facility] ORS;  Service: Urology;  Laterality: Left;    SOCIAL HISTORY:   Social History  Substance Use Topics  . Smoking status: Former Smoker    Quit date: 07/26/1985  . Smokeless tobacco: Never Used  . Alcohol use No    FAMILY HISTORY:   Family History  Problem Relation Age of Onset  . Prostate cancer Neg Hx   . Kidney  cancer Neg Hx   . Bladder Cancer Neg Hx     DRUG ALLERGIES:  No Known Allergies  REVIEW OF SYSTEMS:   Review of Systems  Constitutional: Positive for chills, fever and malaise/fatigue.  HENT: Negative for sore throat.   Eyes: Negative for blurred vision and double vision.  Respiratory: Positive for cough and sputum production. Negative for hemoptysis, shortness of breath, wheezing and stridor.   Cardiovascular: Negative for chest pain, palpitations, orthopnea and leg swelling.  Gastrointestinal: Positive for nausea. Negative for abdominal pain, blood in stool, diarrhea, melena and vomiting.  Genitourinary: Negative for dysuria, flank pain and hematuria.  Musculoskeletal: Negative for back pain and joint pain.  Neurological: Positive for weakness. Negative for dizziness, sensory change, focal weakness, seizures, loss of consciousness and headaches.  Endo/Heme/Allergies: Negative for polydipsia.  Psychiatric/Behavioral: Negative for depression. The patient is not nervous/anxious.     MEDICATIONS AT HOME:   Prior to Admission medications   Medication Sig Start Date End Date Taking? Authorizing Provider  albuterol (PROVENTIL HFA;VENTOLIN HFA) 108 (90 Base) MCG/ACT inhaler Inhale 1 puff into the lungs every 6 (six) hours as needed for wheezing or shortness of breath.   Yes [provider]  allopurinol (ZYLOPRIM) 300 MG tablet Take 300 mg by mouth daily.  01/31/17  Yes [provider]  cyanocobalamin (,VITAMIN B-12,) 1000 MCG/ML injection Inject 1,000 mcg into the muscle every 30 (thirty) days.  01/02/17  Yes [provider]  lisinopril (PRINIVIL,ZESTRIL) 40 MG tablet Take 40 mg by mouth daily.   Yes [provider]  pravastatin (PRAVACHOL) 80 MG tablet Take 80 mg by mouth every evening.  06/12/17  Yes [provider]  RIVAROXABAN PO STUDY DRUG - 2.5mg  twice daily   Yes [provider]  UNABLE TO FIND Take 100 mg by mouth daily. Study  drug: Aspirin 100 mg  Dr Posey Pronto @@ [facility] is monitoring this study   Yes [provider]  vitamin B-12 (CYANOCOBALAMIN) 1000 MCG tablet Take 1,000 mcg by mouth daily.   Yes [provider]  acetaminophen (TYLENOL) 500 MG tablet Take 1,000 mg by mouth every 8 (eight) hours as needed for mild pain.    [provider]  ciprofloxacin (CIPRO) 500 MG tablet Take 1 tablet (500 mg total) by mouth 2 (two) times daily. Patient not taking: Reported on 09/08/2017 08/19/17   Demetrios Loll, MD  HYDROcodone-acetaminophen [facility]) 5-325 MG tablet Take 1 tablet by mouth every 4 (four) hours as needed for moderate pain. Patient not taking: Reported on 09/08/2017 08/09/17   Nickie Retort, MD  VANISHPOINT TUBERCULIN SYRINGE 25G X 1" 1 ML MISC U UTD 07/05/17   [provider]      VITAL SIGNS:  Blood pressure 109/69, pulse 78, temperature 99 F (37.2 C), temperature source Oral, resp. rate 16, height 5\' 9"  (1.753 m), weight 200 lb (90.7  kg), SpO2 97 %.  PHYSICAL EXAMINATION:  Physical Exam  GENERAL:  76 y.o.-year-old patient lying in the bed with no acute distress.  EYES: Pupils equal, round, reactive to light and accommodation. No scleral icterus. Extraocular muscles intact.  HEENT: Head atraumatic, normocephalic. Oropharynx and nasopharynx clear.  NECK:  Supple, no jugular venous distention. No thyroid enlargement, no tenderness.  LUNGS: Normal breath sounds bilaterally, no wheezing, bibasilar crackles. No use of accessory muscles of respiration.  CARDIOVASCULAR: S1, S2 normal. No murmurs, rubs, or gallops.  ABDOMEN: Soft, nontender, nondistended. Bowel sounds present. No organomegaly or mass.  EXTREMITIES: No pedal edema, cyanosis, or clubbing.  NEUROLOGIC: Cranial nerves II through XII are intact. Muscle strength 4/5 in all extremities. Sensation intact. Gait not checked.  PSYCHIATRIC: The patient is alert and oriented x 3.  SKIN: No obvious rash, lesion, or ulcer.    LABORATORY PANEL:   CBC  Recent Labs Lab 09/08/17 1312  WBC 12.5*  HGB 12.0*  HCT 35.7*  PLT 205   ------------------------------------------------------------------------------------------------------------------  Chemistries   Recent Labs Lab 09/08/17 1312  NA 141  K 4.1  CL 108  CO2 26  GLUCOSE 102*  BUN 18  CREATININE 1.41*  CALCIUM 8.8*  AST 18  ALT 15*  ALKPHOS 49  BILITOT 0.8   ------------------------------------------------------------------------------------------------------------------  Cardiac Enzymes  Recent Labs Lab 09/08/17 1312  TROPONINI 0.11*   ------------------------------------------------------------------------------------------------------------------  RADIOLOGY:  Dg Chest Port 1 View  Result Date: 09/08/2017 CLINICAL DATA:  Fever, shortness of breath, cough EXAM: PORTABLE CHEST 1 VIEW COMPARISON:  08/16/2017 FINDINGS: Elevation of the right hemidiaphragm. Suspect small right effusion with right base atelectasis or infiltrate. Mild cardiomegaly. No confluent opacity on the left. IMPRESSION: Small right pleural effusion with right base atelectasis or infiltrate. Mild cardiomegaly. Electronically Signed   By: Rolm Baptise M.D.   On: 09/08/2017 15:29      IMPRESSION AND PLAN:   Sepsis due to pneumonia and UTI. The patient will be admitted to medical floor. Start sepsis protocol, cefepime and vancomycin IV, follow-up CBC, urine culture and blood culture. Urine culture during last hospitalization showed Pseudomonas.  Lactic acidosis. Follow-up lactic acid level.  Elevated troponin, possible due to demanding ischemia. History of CAD. Follow-up troponin, continue aspirin and xarelto.  ARF. Creatinine is better than 3 weeks ago. Give gentle fluid IV. Follow-up BMP.  COPD. Stable, nebulizer when necessary. Chronic CHF. Unknown type. Stable.   Hypertension. Hold lisinopril due to low side blood pressure.  All the records are  reviewed and case discussed with ED provider. Management plans discussed with the patient, his wife and daughter and they are in agreement.  CODE STATUS: Full code  TOTAL TIME TAKING CARE OF THIS PATIENT: 57 minutes.    Demetrios Loll M.D on 09/08/2017 at 4:41 PM  Between 7am to 6pm - Pager - [phone]  After 6pm go to [email or web address] - Proofreader  [facility]  Office  [phone]  CC: Primary care physician; Tracie Harrier, MD   Note: This dictation was prepared with Dragon dictation along with smaller phrase technology. Any transcriptional errors that result from this process are unin

## 2017-09-08 NOTE — ED Notes (Signed)
Pt unable to give urine sample at this time; pt has specimen cup. 

## 2017-09-09 LAB — BASIC METABOLIC PANEL
ANION GAP: 4 — AB (ref 5–15)
BUN: 13 mg/dL (ref 6–20)
CALCIUM: 7.5 mg/dL — AB (ref 8.9–10.3)
CO2: 22 mmol/L (ref 22–32)
CREATININE: 1.21 mg/dL (ref 0.61–1.24)
Chloride: 113 mmol/L — ABNORMAL HIGH (ref 101–111)
GFR calc Af Amer: >60 mL/min (ref >60)
GFR, EST NON AFRICAN AMERICAN: 56 mL/min — AB (ref >60)
GLUCOSE: 122 mg/dL — AB (ref 65–99)
Potassium: 3.6 mmol/L (ref 3.5–5.1)
Sodium: 139 mmol/L (ref 135–145)

## 2017-09-09 LAB — CBC
HCT: 28.3 % — ABNORMAL LOW (ref 40.0–52.0)
HEMOGLOBIN: 9.7 g/dL — AB (ref 13.0–18.0)
MCH: 32.5 pg (ref 26.0–34.0)
MCHC: 34.3 g/dL (ref 32.0–36.0)
MCV: 94.8 fL (ref 80.0–100.0)
Platelets: 146 10*3/uL — ABNORMAL LOW (ref 150–440)
RBC: 2.98 MIL/uL — ABNORMAL LOW (ref 4.40–5.90)
RDW: 13.9 % (ref 11.5–14.5)
WBC: 8.9 10*3/uL (ref 3.8–10.6)

## 2017-09-09 MED ORDER — HOME MED STORE IN PYXIS
1.0000 | Freq: Two times a day (BID) | Status: DC
  Administered 2017-09-09 – 2017-09-11 (×5): 1 via ORAL
  Filled 2017-09-09 (×5): qty 1

## 2017-09-09 MED ORDER — ASPIRIN EC 81 MG PO TBEC
81.0000 mg | DELAYED_RELEASE_TABLET | Freq: Every day | ORAL | Status: DC
  Administered 2017-09-09 – 2017-09-11 (×3): 81 mg via ORAL
  Filled 2017-09-09 (×3): qty 1

## 2017-09-09 MED ORDER — DEXTROSE 5 % IV SOLN
1.0000 g | Freq: Once | INTRAVENOUS | Status: AC
  Administered 2017-09-09: 1 g via INTRAVENOUS
  Filled 2017-09-09: qty 1

## 2017-09-09 MED ORDER — DEXTROSE 5 % IV SOLN
2.0000 g | INTRAVENOUS | Status: DC
  Administered 2017-09-10: 2 g via INTRAVENOUS
  Filled 2017-09-09 (×2): qty 2

## 2017-09-09 NOTE — Progress Notes (Signed)
[place] at [place] NAME: Todd Rojas    MR#:  [id number]  DATE OF BIRTH:  [birth year]  SUBJECTIVE:   Patient here due to fever, sepsis secondary to suspected UT and pneumonia. Patient denying any upper respiratory symptoms presently.  Patient denies any dysuria. Patient's wife is at bedside.  REVIEW OF SYSTEMS:    Review of Systems  Constitutional: Negative for chills and fever.  HENT: Negative for congestion and tinnitus.   Eyes: Negative for blurred vision and double vision.  Respiratory: Negative for cough, shortness of breath and wheezing.   Cardiovascular: Negative for chest pain, orthopnea and PND.  Gastrointestinal: Negative for abdominal pain, diarrhea, nausea and vomiting.  Genitourinary: Negative for dysuria and hematuria.  Neurological: Positive for weakness. Negative for dizziness, sensory change and focal weakness.  All other systems reviewed and are negative.   Nutrition: Heart healthy Tolerating Diet: Yes Tolerating PT: Await Eval.   DRUG ALLERGIES:  No Known Allergies  VITALS:  Blood pressure (!) 110/48, pulse 66, temperature 97.8 F (36.6 C), temperature source Oral, resp. rate 18, height '5\' 9"'  (1.753 m), weight 90.7 kg (200 lb), SpO2 95 %.  PHYSICAL EXAMINATION:   Physical Exam  GENERAL:  76 y.o.-year-old patient lying in bed in no acute distress.  EYES: Pupils equal, round, reactive to light and accommodation. No scleral icterus. Extraocular muscles intact.  HEENT: Head atraumatic, normocephalic. Oropharynx and nasopharynx clear.  NECK:  Supple, no jugular venous distention. No thyroid enlargement, no tenderness.  LUNGS: Poor Resp. effort, no wheezing, rales, rhonchi. No use of accessory muscles of respiration.  CARDIOVASCULAR: S1, S2 normal. No murmurs, rubs, or gallops.  ABDOMEN: Soft, nontender, nondistended. Bowel sounds present. No organomegaly or mass.  EXTREMITIES: No cyanosis, clubbing or edema b/l.     NEUROLOGIC: Cranial nerves II through XII are intact. No focal Motor or sensory deficits b/l. Globally weak.  PSYCHIATRIC: The patient is alert and oriented x 3.  SKIN: No obvious rash, lesion, or ulcer.    LABORATORY PANEL:   CBC  Recent Labs Lab 09/09/17 0552  WBC 8.9  HGB 9.7*  HCT 28.3*  PLT 146*   ------------------------------------------------------------------------------------------------------------------  Chemistries   Recent Labs Lab 09/08/17 1312 09/09/17 0552  NA 141 139  K 4.1 3.6  CL 108 113*  CO2 26 22  GLUCOSE 102* 122*  BUN 18 13  CREATININE 1.41* 1.21  CALCIUM 8.8* 7.5*  AST 18  --   ALT 15*  --   ALKPHOS 49  --   BILITOT 0.8  --    ------------------------------------------------------------------------------------------------------------------  Cardiac Enzymes  Recent Labs Lab 09/08/17 2318  TROPONINI 0.10*   ------------------------------------------------------------------------------------------------------------------  RADIOLOGY:  Dg Chest Port 1 View  Result Date: 09/08/2017 CLINICAL DATA:  Fever, shortness of breath, cough EXAM: PORTABLE CHEST 1 VIEW COMPARISON:  08/16/2017 FINDINGS: Elevation of the right hemidiaphragm. Suspect small right effusion with right base atelectasis or infiltrate. Mild cardiomegaly. No confluent opacity on the left. IMPRESSION: Small right pleural effusion with right base atelectasis or infiltrate. Mild cardiomegaly. Electronically Signed   By: Rolm Baptise M.D.   On: 09/08/2017 15:29     ASSESSMENT AND PLAN:   76 year old male with past medical  History of hypertension, hyperlipidemia, GERD, COPD, history of coronary artery disease, CHF, history of pseudomonal sepsis who presented to the hospital due to weakness, fever and noted to have sepsis secondary to urinary tract infection.  1. Sepsis-patient met criteria admission given his leukocytosis, elevated  lactic acid and abnormal  urinalysis. Patient did have chest x-ray findings suggestive of possible pneumonia but this is most likely atelectasis. -Lactic acid is improved, white cell count is normalized. Patient is hemodynamically stable and currently afebrile. Continue IV cefepime, await urine cultures. Patient has previous history of pseudomonal sepsis but currently blood cultures are negative.  2. Urinary tract infection-suspected source of patient's sepsis. Continue IV cefepime, follow urine cultures and blood cultures.  3. History of gout-continue allopurinol. -no acute attack.  4. hyperlipidemia-continue Pravachol.  5. COPD-no acute exacerbation. Continue albuterol nebulizers as needed.  All the records are reviewed and case discussed with Care Management/Social Worker. Management plans discussed with the patient, family and they are in agreement.  CODE STATUS: full code  DVT Prophylaxis: heparin subcutaneous  TOTAL TIME TAKING CARE OF THIS PATIENT: 30 minutes.   POSSIBLE D/C IN 1-2 DAYS, DEPENDING ON CLINICAL CONDITION.   Henreitta Leber M.D on 09/09/2017 at 1:38 PM  Between 7am to 6pm - Pager - [phone]  After 6pm go to [email or web address] - Proofreader  [facility] [place] [facility]  Office  [phone]  CC: Primary care physician; Tracie Harrier, MD

## 2017-09-09 NOTE — Progress Notes (Signed)
Per Dr. Verdell Carmine discontinue order for fluids.

## 2017-09-09 NOTE — Progress Notes (Signed)
Pharmacy Antibiotic Note  Todd Rojas is a 76 y.o. male admitted on 09/08/2017 with Pneumonia/UTI.  Pharmacy has been consulted for cefepime and vancomycin dosing.  Plan: Patient ordered vancomycin 1500mg  IV x 1. Will continue patient on vancomycin 1250mg  IV Q18hr for goal trough of 15-20.   Will increase cefepime to 2g IV Q24hr.    Height: 5\' 9"  (175.3 cm) Weight: 200 lb (90.7 kg) IBW/kg (Calculated) : 70.7  Temp (24hrs), Avg:99.3 F (37.4 C), Min:97.8 F (36.6 C), Max:102.5 F (39.2 C)   Recent Labs Lab 09/08/17 1312 09/08/17 1337 09/08/17 1757 09/09/17 0552  WBC 12.5*  --   --  8.9  CREATININE 1.41*  --   --  1.21  LATICACIDVEN  --  2.2* 1.3  --     Estimated Creatinine Clearance: 57.8 mL/min (by C-G formula based on SCr of 1.21 mg/dL).    No Known Allergies  Antimicrobials this admission: Cipro 9/21 x 1  Cefepime 9/21 >>  Vancomycin 9/21 >>  Dose adjustments this admission: N/A  Microbiology results: 9/21 BCx: pending  9/21 UCx: pending 9/21 MRSA PCR: pending   Thank you for allowing pharmacy to be a part of this patient's care.  Lenis Noon, PharmD Clinical Pharmacist 09/09/2017 10:50 AM

## 2017-09-09 NOTE — Progress Notes (Signed)
ANTICOAGULATION CONSULT NOTE - Initial Consult  Pharmacy Consult for Xarelto  Indication: Study medication  No Known Allergies  Patient Measurements: Height: 5\' 9"  (175.3 cm) Weight: 200 lb (90.7 kg) IBW/kg (Calculated) : 70.7  Vital Signs: Temp: 97.8 F (36.6 C) (09/22 0535) Temp Source: Oral (09/22 0535) BP: 110/48 (09/22 0535) Pulse Rate: 66 (09/22 0535)  Labs:  Recent Labs  09/08/17 1312 09/08/17 1359 09/08/17 1746 09/08/17 2318 09/09/17 0552  HGB 12.0*  --   --   --  9.7*  HCT 35.7*  --   --   --  28.3*  PLT 205  --   --   --  146*  LABPROT  --  16.1*  --   --   --   INR  --  1.30  --   --   --   CREATININE 1.41*  --   --   --  1.21  TROPONINI 0.11*  --  0.10* 0.10*  --     Estimated Creatinine Clearance: 57.8 mL/min (by C-G formula based on SCr of 1.21 mg/dL).   Assessment: Patient is on rivaroxaban 2.5 mg PO BID as part of a medication study.   Plan:  Spoke with RN who reports patient's family brought in medication to be labeled.  Have entered dose, pharmacy to label and dispense once we receive from RN.  Lenis Noon, PharmD, BCPS 09/09/2017,10:40 AM

## 2017-09-10 LAB — CBC
HEMATOCRIT: 28.4 % — AB (ref 40.0–52.0)
HEMOGLOBIN: 9.4 g/dL — AB (ref 13.0–18.0)
MCH: 31.7 pg (ref 26.0–34.0)
MCHC: 33.3 g/dL (ref 32.0–36.0)
MCV: 95.1 fL (ref 80.0–100.0)
Platelets: 143 10*3/uL — ABNORMAL LOW (ref 150–440)
RBC: 2.98 MIL/uL — AB (ref 4.40–5.90)
RDW: 14 % (ref 11.5–14.5)
WBC: 9.3 10*3/uL (ref 3.8–10.6)

## 2017-09-10 NOTE — Progress Notes (Signed)
[place] at [place] NAME: Todd Rojas    MR#:  [id number]  DATE OF BIRTH:  1941/02/07  SUBJECTIVE:   Patient here due to fever, sepsis secondary to suspected UTI and pneumonia. No acute events overnight.  Still feels weak.  Wife at bedside. Urine cultures are + for pseudomonas but sensitivities pending  REVIEW OF SYSTEMS:    Review of Systems  Constitutional: Negative for chills and fever.  HENT: Negative for congestion and tinnitus.   Eyes: Negative for blurred vision and double vision.  Respiratory: Negative for cough, shortness of breath and wheezing.   Cardiovascular: Negative for chest pain, orthopnea and PND.  Gastrointestinal: Negative for abdominal pain, diarrhea, nausea and vomiting.  Genitourinary: Negative for dysuria and hematuria.  Neurological: Positive for weakness. Negative for dizziness, sensory change and focal weakness.  All other systems reviewed and are negative.   Nutrition: Heart healthy Tolerating Diet: Yes Tolerating PT: Await Eval.   DRUG ALLERGIES:  No Known Allergies  VITALS:  Blood pressure (!) 105/44, pulse 62, temperature 98.6 F (37 C), temperature source Oral, resp. rate 18, height 5' 9" (1.753 m), weight 90.7 kg (200 lb), SpO2 93 %.  PHYSICAL EXAMINATION:   Physical Exam  GENERAL:  76 y.o.-year-old patient lying in bed in no acute distress.  EYES: Pupils equal, round, reactive to light and accommodation. No scleral icterus. Extraocular muscles intact.  HEENT: Head atraumatic, normocephalic. Oropharynx and nasopharynx clear.  NECK:  Supple, no jugular venous distention. No thyroid enlargement, no tenderness.  LUNGS: Poor Resp. effort, no wheezing, rales, rhonchi. No use of accessory muscles of respiration.  CARDIOVASCULAR: S1, S2 normal. No murmurs, rubs, or gallops.  ABDOMEN: Soft, nontender, nondistended. Bowel sounds present. No organomegaly or mass.  EXTREMITIES: No cyanosis, clubbing or  edema b/l.    NEUROLOGIC: Cranial nerves II through XII are intact. No focal Motor or sensory deficits b/l. Globally weak.  PSYCHIATRIC: The patient is alert and oriented x 3.  SKIN: No obvious rash, lesion, or ulcer.    LABORATORY PANEL:   CBC  Recent Labs Lab 09/10/17 0331  WBC 9.3  HGB 9.4*  HCT 28.4*  PLT 143*   ------------------------------------------------------------------------------------------------------------------  Chemistries   Recent Labs Lab 09/08/17 1312 09/09/17 0552  NA 141 139  K 4.1 3.6  CL 108 113*  CO2 26 22  GLUCOSE 102* 122*  BUN 18 13  CREATININE 1.41* 1.21  CALCIUM 8.8* 7.5*  AST 18  --   ALT 15*  --   ALKPHOS 49  --   BILITOT 0.8  --    ------------------------------------------------------------------------------------------------------------------  Cardiac Enzymes  Recent Labs Lab 09/08/17 2318  TROPONINI 0.10*   ------------------------------------------------------------------------------------------------------------------  RADIOLOGY:  Dg Chest Port 1 View  Result Date: 09/08/2017 CLINICAL DATA:  Fever, shortness of breath, cough EXAM: PORTABLE CHEST 1 VIEW COMPARISON:  08/16/2017 FINDINGS: Elevation of the right hemidiaphragm. Suspect small right effusion with right base atelectasis or infiltrate. Mild cardiomegaly. No confluent opacity on the left. IMPRESSION: Small right pleural effusion with right base atelectasis or infiltrate. Mild cardiomegaly. Electronically Signed   By: Rolm Baptise M.D.   On: 09/08/2017 15:29     ASSESSMENT AND PLAN:   76 year old male with past medical  History of hypertension, hyperlipidemia, GERD, COPD, history of coronary artery disease, CHF, history of pseudomonal sepsis who presented to the hospital due to weakness, fever and noted to have sepsis secondary to urinary tract infection.  1. Sepsis-patient met criteria admission  given his leukocytosis, elevated lactic acid and abnormal  urinalysis. Patient did have chest x-ray findings suggestive of possible pneumonia but this is most likely atelectasis. -Lactic acid is improved, white cell count is normalized. Patient is hemodynamically stable and currently afebrile.  -urine cultures are positive or Pseudomonas, but sensitivities are pending. Continue IV cefepime, Blood cultures presently remain negative  2. Urinary tract infection-  Continue IV cefepime,  - Urine cultures are + for Pseudomonas.  Await sensitivities.   3. History of gout-continue allopurinol. -no acute attack.  4. hyperlipidemia-continue Pravachol.  5. COPD-no acute exacerbation. Continue albuterol nebulizers as needed.  All the records are reviewed and case discussed with Care Management/Social Worker. Management plans discussed with the patient, family and they are in agreement.  CODE STATUS: full code  DVT Prophylaxis: heparin subcutaneous  TOTAL TIME TAKING CARE OF THIS PATIENT: 30 minutes.   POSSIBLE D/C IN 1-2 DAYS, DEPENDING ON CLINICAL CONDITION.   Henreitta Leber M.D on 09/10/2017 at 12:12 PM  Between 7am to 6pm - Pager - [phone]  After 6pm go to [email or web address] - Proofreader  [facility] [place] [facility]  Office  [phone]  CC: Primary care physician; Tracie Harrier, MD

## 2017-09-11 LAB — URINE CULTURE: Culture: >=100000 — AB

## 2017-09-11 LAB — CREATININE, SERUM
Creatinine, Ser: 1.35 mg/dL — ABNORMAL HIGH (ref 0.61–1.24)
GFR, EST AFRICAN AMERICAN: 57 mL/min — AB (ref >60)
GFR, EST NON AFRICAN AMERICAN: 49 mL/min — AB (ref >60)

## 2017-09-11 MED ORDER — DEXTROSE 5 % IV SOLN
2.0000 g | Freq: Two times a day (BID) | INTRAVENOUS | Status: DC
  Administered 2017-09-11: 2 g via INTRAVENOUS
  Filled 2017-09-11 (×2): qty 2

## 2017-09-11 MED ORDER — CIPROFLOXACIN HCL 750 MG PO TABS: 750.0000 mg | ORAL_TABLET | Freq: Two times a day (BID) | ORAL | 0 refills | Status: AC

## 2017-09-11 NOTE — Discharge Summary (Signed)
Todd Rojas: [name]    MR#:  [id number]  DATE OF BIRTH:  1941-08-11  DATE OF ADMISSION:  09/08/2017 ADMITTING PHYSICIAN: Demetrios Loll, MD  DATE OF DISCHARGE: 09/11/2017  1:16 PM  PRIMARY CARE PHYSICIAN: Tracie Harrier, MD    ADMISSION DIAGNOSIS:  Elevated troponin [R74.8] Sepsis, due to unspecified organism (Todd Rojas) [A41.9]  DISCHARGE DIAGNOSIS:  Active Problems:   Sepsis (Todd Rojas)   SECONDARY DIAGNOSIS:   Past Medical History:  Diagnosis Date  . Asthma   . CAD (coronary artery disease) 02/13/2012   Overview:   12/2004 - Anginal equivalent symptoms:  Left arm pain, weakness, and shortness of breath.   12/2004:  Anterior STEMI.  Transferred to [facility].   01/06/2005 - Cardiac cath:  EF 51%, an occluded mid LAD and a large OM2 with 75% ostial stenosis.  PCI of the mid occluded LAD using a Cypher stent.   03/2005 - 2D echo:  EF >55%, LAE 4.7 cm, no significant valvular disease.   02/2008:  Plavix stopped.   06/2008:  Recurrent angina - symptoms of chest tightness.  Presented to [facility].  Cardiac catheterization.  The patient stated nonobstructive CAD seen.  Plavix restarted at that time.   03/2009 - 2D echo:  EF 35%, akinetic apex and septum.  Stress echocardiogram deferred for cardiac catheterization.   03/30/2009:  PCI 90% proximal LAD with a 3.0 x 18 mm Xience DES.   02/2010: Slow and gradual return of chest tightness, fatigue and generalized weakness concerning for return of anginal equivalent.  Stress test did not show ischemia to the heart muscle during exercise.  Evidenc  . CHF (congestive heart failure) ([place])   . COPD (chronic obstructive pulmonary disease) ([place]) 07/03/2017  . Dyslipidemia 02/13/2012   Overview:   03/2009  TC 158, TG 88, HDL 39, LDL 102.  11/2013  TC 148, TG 115, HDL 44.3, LDL 80.7 (done at PCP's office)   10/2014 TC 146  11/2015 TC 123, TG 86, HDL 41.7, LDL 64  11/2016 TC 140, TG 129,  HDL 40.1, LDL 74   . Dyspnea   . GERD (gastroesophageal reflux disease)   . GI disease 03/09/2013   Overview:   Dysphagia - EGD demonstrated esophageal stricture which was dilated   GERD   Hemorrhoids   . Hearing loss 03/09/2013  . Hyperlipidemia, unspecified 07/03/2017  . Hypertension 02/13/2012  . Myocardial infarction ([place])   . Personal history of gout 12/09/2016  . Primary osteoarthritis of both knees 05/05/2015  . Right-sided low back pain without sciatica 04/14/2016    HOSPITAL COURSE:   76 year old male with past medical  History of hypertension, hyperlipidemia, GERD, COPD, history of coronary artery disease, CHF, history of pseudomonal sepsis who presented to the hospital due to weakness, fever and noted to have sepsis secondary to urinary tract infection.  1. Sepsis-patient met criteria admission given his leukocytosis, elevated lactic acid and abnormal urinalysis. Patient did have chest x-ray findings suggestive of possible pneumonia but this was most likely atelectasis. -patient was initially started on broad-spectrum IV antibiotics with vancomycin. Vancomycin was discontinued, patient was maintained on the cefepime. Patient's urine cultures were positive for Pseudomonas which was sensitive to IV cefepime. -his blood cultures remained negative.  Patient does have a previous history of nephrolithiasis with urinary stent placement but stent was removed about 2 weeks ago. Discussed plan of care with urology and also infectious disease. Patient is to be discharged  on oral ciprofloxacin for additional 14 days and follow-up with urology next week.  2. Urinary tract infection- while in the hospital patient was maintained on IV cefepime, now being discharged on oral ciprofloxacin. He is currently afebrile and hemodynamically stable. His white cell count has   3. History of gout- he will continue allopurinol. - no acute attack while in the hospital.   4. hyperlipidemia- he will continue  Pravachol.  5. COPD- no acute exacerbation. Cont. Albuterol inhaler as needed.   DISCHARGE CONDITIONS:   Stable.   CONSULTS OBTAINED:    DRUG ALLERGIES:  No Known Allergies  DISCHARGE MEDICATIONS:   Allergies as of 09/11/2017   No Known Allergies     Medication List    STOP taking these medications   HYDROcodone-acetaminophen 5-325 MG tablet Commonly known as:  NORCO     TAKE these medications   acetaminophen 500 MG tablet Commonly known as:  TYLENOL Take 1,000 mg by mouth every 8 (eight) hours as needed for mild pain.   albuterol 108 (90 Base) MCG/ACT inhaler Commonly known as:  PROVENTIL HFA;VENTOLIN HFA Inhale 1 puff into the lungs every 6 (six) hours as needed for wheezing or shortness of breath.   allopurinol 300 MG tablet Commonly known as:  ZYLOPRIM Take 300 mg by mouth daily.   ciprofloxacin 750 MG tablet Commonly known as:  CIPRO Take 1 tablet (750 mg total) by mouth 2 (two) times daily. What changed:  medication strength  how much to take   vitamin B-12 1000 MCG tablet Commonly known as:  CYANOCOBALAMIN Take 1,000 mcg by mouth daily.   cyanocobalamin 1000 MCG/ML injection Commonly known as:  (VITAMIN B-12) Inject 1,000 mcg into the muscle every 30 (thirty) days.   lisinopril 40 MG tablet Commonly known as:  PRINIVIL,ZESTRIL Take 40 mg by mouth daily.   pravastatin 80 MG tablet Commonly known as:  PRAVACHOL Take 80 mg by mouth every evening.   RIVAROXABAN PO STUDY DRUG - 2.63m twice daily   UNABLE TO FIND Take 100 mg by mouth daily. Study drug: Aspirin 100 mg  Dr PPosey Pronto@@ [facility] is monitoring this study   VANISHPOINT TUBERCULIN SYRINGE 25G X 1" 1 ML Misc Generic drug:  Tuberculin-Allergy Syringes U UTD            Discharge Care Instructions        Start     Ordered   09/11/17 0000  ciprofloxacin (CIPRO) 750 MG tablet  2 times daily     09/11/17 1034   09/11/17 0000  Activity as tolerated - No restrictions     09/11/17 1034    09/11/17 0000  Diet - low sodium heart healthy     09/11/17 1034        DISCHARGE INSTRUCTIONS:   DIET:  Cardiac diet  DISCHARGE CONDITION:  Stable  ACTIVITY:  Activity as tolerated  OXYGEN:  Home Oxygen: No.   Oxygen Delivery: room air  DISCHARGE LOCATION:  home   If you experience worsening of your admission symptoms, develop shortness of breath, life threatening emergency, suicidal or homicidal thoughts you must seek medical attention immediately by calling 911 or calling your MD immediately  if symptoms less severe.  You Must read complete instructions/literature along with all the possible adverse reactions/side effects for all the Medicines you take and that have been prescribed to you. Take any new Medicines after you have completely understood and accpet all the possible adverse reactions/side effects.   Please note  You  were cared for by a hospitalist during your hospital stay. If you have any questions about your discharge medications or the care you received while you were in the hospital after you are discharged, you can call the unit and asked to speak with the hospitalist on call if the hospitalist that took care of you is not available. Once you are discharged, your primary care physician will handle any further medical issues. Please note that NO REFILLS for any discharge medications will be authorized once you are discharged, as it is imperative that you return to your primary care physician (or establish a relationship with a primary care physician if you do not have one) for your aftercare needs so that they can reassess your need for medications and monitor your lab values.     Today   Afebrile, hemodynamically stable. Will d/c home today.  No other acute complaints.   VITAL SIGNS:  Blood pressure (!) 142/78, pulse 71, temperature 97.8 F (36.6 C), temperature source Oral, resp. rate 18, height '5\' 9"'  (1.753 m), weight 90.7 kg (200 lb), SpO2 96  %.  I/O:   Intake/Output Summary (Last 24 hours) at 09/11/17 1524 Last data filed at 09/11/17 1003  Gross per 24 hour  Intake              725 ml  Output             1200 ml  Net             -475 ml    PHYSICAL EXAMINATION:  GENERAL:  76 y.o.-year-old patient lying in the bed with no acute distress.  EYES: Pupils equal, round, reactive to light and accommodation. No scleral icterus. Extraocular muscles intact.  HEENT: Head atraumatic, normocephalic. Oropharynx and nasopharynx clear.  NECK:  Supple, no jugular venous distention. No thyroid enlargement, no tenderness.  LUNGS: Normal breath sounds bilaterally, no wheezing, rales,rhonchi. No use of accessory muscles of respiration.  CARDIOVASCULAR: S1, S2 normal. No murmurs, rubs, or gallops.  ABDOMEN: Soft, non-tender, non-distended. Bowel sounds present. No organomegaly or mass.  EXTREMITIES: No pedal edema, cyanosis, or clubbing.  NEUROLOGIC: Cranial nerves II through XII are intact. No focal motor or sensory defecits b/l.  PSYCHIATRIC: The patient is alert and oriented x 3. SKIN: No obvious rash, lesion, or ulcer.   DATA REVIEW:   CBC  Recent Labs Lab 09/10/17 0331  WBC 9.3  HGB 9.4*  HCT 28.4*  PLT 143*    Chemistries   Recent Labs Lab 09/08/17 1312 09/09/17 0552 09/11/17 0300  NA 141 139  --   K 4.1 3.6  --   CL 108 113*  --   CO2 26 22  --   GLUCOSE 102* 122*  --   BUN 18 13  --   CREATININE 1.41* 1.21 1.35*  CALCIUM 8.8* 7.5*  --   AST 18  --   --   ALT 15*  --   --   ALKPHOS 49  --   --   BILITOT 0.8  --   --     Cardiac Enzymes  Recent Labs Lab 09/08/17 2318  TROPONINI 0.10*    Microbiology Results  Results for orders placed or performed during the hospital encounter of 09/08/17  Urine culture     Status: Abnormal   Collection Time: 09/08/17  1:37 PM  Result Value Ref Range Status   Specimen Description URINE, RANDOM  Final   Special Requests NONE  Final   Culture >=  100,000 COLONIES/mL  PSEUDOMONAS AERUGINOSA (A)  Final   Report Status 09/11/2017 FINAL  Final   Organism ID, Bacteria PSEUDOMONAS AERUGINOSA (A)  Final      Susceptibility   Pseudomonas aeruginosa - MIC*    CEFTAZIDIME 4 SENSITIVE Sensitive     CIPROFLOXACIN <=0.25 SENSITIVE Sensitive     GENTAMICIN <=1 SENSITIVE Sensitive     IMIPENEM 2 SENSITIVE Sensitive     PIP/TAZO <=4 SENSITIVE Sensitive     CEFEPIME 2 SENSITIVE Sensitive     * >=100,000 COLONIES/mL PSEUDOMONAS AERUGINOSA  Blood Culture (routine x 2)     Status: None (Preliminary result)   Collection Time: 09/08/17  2:14 PM  Result Value Ref Range Status   Specimen Description BLOOD LEFT ANTECUBITAL  Final   Special Requests   Final    Blood Culture results may not be optimal due to an excessive volume of blood received in culture bottles   Culture NO GROWTH 3 DAYS  Final   Report Status PENDING  Incomplete  Blood Culture (routine x 2)     Status: None (Preliminary result)   Collection Time: 09/08/17  2:14 PM  Result Value Ref Range Status   Specimen Description BLOOD Blood Culture adequate volume  Final   Special Requests BLOOD RIGHT HAND  Final   Culture NO GROWTH 3 DAYS  Final   Report Status PENDING  Incomplete  MRSA PCR Screening     Status: None   Collection Time: 09/08/17  9:14 PM  Result Value Ref Range Status   MRSA by PCR NEGATIVE NEGATIVE Final    Comment:        The GeneXpert MRSA Assay (FDA approved for NASAL specimens only), is one component of a comprehensive MRSA colonization surveillance program. It is not intended to diagnose MRSA infection nor to guide or monitor treatment for MRSA infections.     RADIOLOGY:  No results found.    Management plans discussed with the patient, family and they are in agreement.  CODE STATUS:     Code Status Orders        Start     Ordered   09/08/17 1700  Full code  Continuous     09/08/17 1659    Code Status History    Date Active Date Inactive Code Status Order ID  Comments User Context   08/16/2017  3:34 PM 08/19/2017  5:17 PM Full Code [id number]  Fritzi Mandes, MD Inpatient      TOTAL TIME TAKING CARE OF THIS PATIENT: 40 minutes.    Henreitta Leber M.D on 09/11/2017 at 3:24 PM  Between 7am to 6pm - Pager - [phone]  After 6pm go to [email or web address] - Proofreader  [facility]  Office  [phone]  CC: Primary care physician; Tracie Harrier, MD

## 2017-09-11 NOTE — Progress Notes (Signed)
Pharmacy Antibiotic Note  Todd Rojas is a 76 y.o. male admitted on 09/08/2017 with Pneumonia/UTI.  Pharmacy has been consulted for cefepime and vancomycin dosing.  Plan: Pt currently ordered cefepime 2g IV Q24hr with pseudomonas growing in UCx. Will increase to 2 g IV q12h based on current renal function.     Height: 5\' 9"  (175.3 cm) Weight: 200 lb (90.7 kg) IBW/kg (Calculated) : 70.7  Temp (24hrs), Avg:98.7 F (37.1 C), Min:97.8 F (36.6 C), Max:100 F (37.8 C)   Recent Labs Lab 09/08/17 1312 09/08/17 1337 09/08/17 1757 09/09/17 0552 09/10/17 0331 09/11/17 0300  WBC 12.5*  --   --  8.9 9.3  --   CREATININE 1.41*  --   --  1.21  --  1.35*  LATICACIDVEN  --  2.2* 1.3  --   --   --     Estimated Creatinine Clearance: 51.8 mL/min (A) (by C-G formula based on SCr of 1.35 mg/dL (H)).    No Known Allergies  Antimicrobials this admission: Cipro 9/21 x 1  Cefepime 9/21 >>  Vancomycin 9/21 >> 9/22  Dose adjustments this admission: N/A  Microbiology results: 9/21 BCx: NGTD x3 days 9/21 UCx: pseudomonas aeruginosa >100k sensitivities pending 9/21 MRSA PCR: Neg  Thank you for allowing pharmacy to be a part of this patient's care.  Rocky Morel, PharmD Clinical Pharmacist 09/11/2017 8:37 AM

## 2017-09-11 NOTE — Care Management Important Message (Signed)
Important Message  Patient Details  Name: Todd Rojas MRN: [id number] Date of Birth: 05-29-1941   Medicare Important Message Given:  Yes    Beverly Sessions, RN 09/11/2017, 2:24 PM

## 2017-09-11 NOTE — Progress Notes (Signed)
Patient discharged to home as ordered. Discharge instructions and follow up appointments given as ordered. Patient is alert and oriented, ambulates well without assistance IV discontinued site clean dry and intact. Patient wife at the bedside to take patient home.

## 2017-09-13 LAB — CULTURE, BLOOD (ROUTINE X 2)
Culture: NO GROWTH
Culture: NO GROWTH
SPECIMEN DESCRIPTION: ADEQUATE

## 2017-09-18 ENCOUNTER — Ambulatory Visit
Admission: RE | Admit: 2017-09-18 | Discharge: 2017-09-18 | Disposition: A | Discharge status: Home / Self Care | Payer: Medicare Other | Source: Ambulatory Visit | Attending: Urology | Admitting: Urology

## 2017-09-18 DIAGNOSIS — N2889 Other specified disorders of kidney and ureter: Secondary | ICD-10-CM | POA: Diagnosis present

## 2017-09-18 DIAGNOSIS — Z87442 Personal history of urinary calculi: Secondary | ICD-10-CM | POA: Diagnosis not present

## 2017-09-18 DIAGNOSIS — N201 Calculus of ureter: Secondary | ICD-10-CM

## 2017-09-20 ENCOUNTER — Encounter: Payer: Self-pay | Admitting: Urology

## 2017-09-20 ENCOUNTER — Ambulatory Visit (INDEPENDENT_AMBULATORY_CARE_PROVIDER_SITE_OTHER): Payer: Medicare Other | Admitting: Urology

## 2017-09-20 VITALS — BP 154/73 | HR 63 | Ht 69.0 in | Wt 201.1 lb

## 2017-09-20 DIAGNOSIS — N200 Calculus of kidney: Secondary | ICD-10-CM

## 2017-09-20 NOTE — Progress Notes (Signed)
09/20/2017 10:19 AM   Todd Rojas 1941/11/05 [id number]  Referring provider: Tracie Harrier, [facility] [place], [place] [address]  Chief Complaint  Patient presents with  . Follow-up    Renal US results    HPI: The patient is a 76 year old gentleman presents today for 1 month follow-up after undergoing incision of the left ureterocele, removal of distal 1 severe left renal stone, and removal of a left 1 cm nonobstructing renal stone.  His postoperative course was complicated by urinary retention and eventually sepsis secondary to left pyelonephritis requiring hospitalization. Blood and urine cultures did show pan sensitive pseudomonas. He received an extended course of Cipro for this.  Postoperative renal ultrasound showed no hydronephrosis bilaterally. There was a small left renal jet noted. The incised ureterocele was appreciated on this ultrasound.  Stone analysis revealed the stone that was 97% calcium oxalate monohydrate and 3% calcium phosphate. The nidus was the calcium oxalate monohydrate.    PMH: Past Medical History:  Diagnosis Date  . Asthma   . CAD (coronary artery disease) 02/13/2012   Overview:   12/2004 - Anginal equivalent symptoms:  Left arm pain, weakness, and shortness of breath.   12/2004:  Anterior STEMI.  Transferred to [facility].   01/06/2005 - Cardiac cath:  EF 51%, an occluded mid LAD and a large OM2 with 75% ostial stenosis.  PCI of the mid occluded LAD using a Cypher stent.   03/2005 - 2D echo:  EF >55%, LAE 4.7 cm, no significant valvular disease.   02/2008:  Plavix stopped.   06/2008:  Recurrent angina - symptoms of chest tightness.  Presented to [facility].  Cardiac catheterization.  The patient stated nonobstructive CAD seen.  Plavix restarted at that time.   03/2009 - 2D echo:  EF 35%, akinetic apex and septum.  Stress echocardiogram deferred for cardiac catheterization.   03/30/2009:  PCI 90%  proximal LAD with a 3.0 x 18 mm Xience DES.   02/2010: Slow and gradual return of chest tightness, fatigue and generalized weakness concerning for return of anginal equivalent.  Stress test did not show ischemia to the heart muscle during exercise.  Evidenc  . CHF (congestive heart failure) ([place])   . COPD (chronic obstructive pulmonary disease) ([place]) 07/03/2017  . Dyslipidemia 02/13/2012   Overview:   03/2009  TC 158, TG 88, HDL 39, LDL 102.  11/2013  TC 148, TG 115, HDL 44.3, LDL 80.7 (done at PCP's office)   10/2014 TC 146  11/2015 TC 123, TG 86, HDL 41.7, LDL 64  11/2016 TC 140, TG 129, HDL 40.1, LDL 74   . Dyspnea   . GERD (gastroesophageal reflux disease)   . GI disease 03/09/2013   Overview:   Dysphagia - EGD demonstrated esophageal stricture which was dilated   GERD   Hemorrhoids   . Hearing loss 03/09/2013  . Hyperlipidemia, unspecified 07/03/2017  . Hypertension 02/13/2012  . Myocardial infarction ([place])   . Personal history of gout 12/09/2016  . Primary osteoarthritis of both knees 05/05/2015  . Right-sided low back pain without sciatica 04/14/2016    Surgical History: Past Surgical History:  Procedure Laterality Date  . coronary stents    . CYSTOSCOPY WITH STENT PLACEMENT Left 08/09/2017   Procedure: CYSTOSCOPY WITH STENT PLACEMENT;  Surgeon: Nickie Retort, MD;  Location: [facility] ORS;  Service: Urology;  Laterality: Left;  Marland Kitchen MEDIAL PARTIAL KNEE REPLACEMENT Bilateral 2016  . URETEROSCOPY WITH HOLMIUM LASER LITHOTRIPSY Left  08/09/2017   Procedure: URETEROSCOPY WITH HOLMIUM LASER LITHOTRIPSY/INCISION OF URETEROCELE;  Surgeon: Nickie Retort, MD;  Location: [facility] ORS;  Service: Urology;  Laterality: Left;    Home Medications:  Allergies as of 09/20/2017   No Known Allergies     Medication List       Accurate as of 09/20/17 10:19 AM. Always use your most recent med list.          acetaminophen 500 MG tablet Commonly known as:  TYLENOL Take 1,000 mg by mouth every 8  (eight) hours as needed for mild pain.   albuterol 108 (90 Base) MCG/ACT inhaler Commonly known as:  PROVENTIL HFA;VENTOLIN HFA Inhale 1 puff into the lungs every 6 (six) hours as needed for wheezing or shortness of breath.   allopurinol 300 MG tablet Commonly known as:  ZYLOPRIM Take 300 mg by mouth daily.   ciprofloxacin 750 MG tablet Commonly known as:  CIPRO Take 1 tablet (750 mg total) by mouth 2 (two) times daily.   vitamin B-12 1000 MCG tablet Commonly known as:  CYANOCOBALAMIN Take 1,000 mcg by mouth daily.   cyanocobalamin 1000 MCG/ML injection Commonly known as:  (VITAMIN B-12) Inject 1,000 mcg into the muscle every 30 (thirty) days.   lisinopril 40 MG tablet Commonly known as:  PRINIVIL,ZESTRIL Take 40 mg by mouth daily.   pravastatin 80 MG tablet Commonly known as:  PRAVACHOL Take 80 mg by mouth every evening.   RIVAROXABAN PO STUDY DRUG - 2.5mg  twice daily   UNABLE TO FIND Take 100 mg by mouth daily. Study drug: Aspirin 100 mg  Dr Posey Pronto @@ [facility] is monitoring this study   VANISHPOINT TUBERCULIN SYRINGE 25G X 1" 1 ML Misc Generic drug:  Tuberculin-Allergy Syringes U UTD       Allergies: No Known Allergies  Family History: Family History  Problem Relation Age of Onset  . Prostate cancer Neg Hx   . Kidney cancer Neg Hx   . Bladder Cancer Neg Hx     Social History:  reports that he quit smoking about 32 years ago. He has never used smokeless tobacco. He reports that he does not drink alcohol or use drugs.  ROS: UROLOGY Frequent Urination?: No Hard to postpone urination?: No Burning/pain with urination?: No Get up at night to urinate?: No Leakage of urine?: No Urine stream starts and stops?: No Trouble starting stream?: No Do you have to strain to urinate?: No Blood in urine?: No Urinary tract infection?: No Sexually transmitted disease?: No Injury to kidneys or bladder?: No Painful intercourse?: No Weak stream?: No Erection problems?:  No Penile pain?: No  Gastrointestinal Nausea?: No Vomiting?: No Indigestion/heartburn?: No Diarrhea?: No Constipation?: No  Constitutional Fever: No Night sweats?: Yes Weight loss?: No Fatigue?: No  Skin Skin rash/lesions?: No Itching?: No  Eyes Blurred vision?: No Double vision?: No  Ears/Nose/Throat Sore throat?: No Sinus problems?: No  Hematologic/Lymphatic Swollen glands?: No Easy bruising?: No  Cardiovascular Leg swelling?: No Chest pain?: No  Respiratory Cough?: No Shortness of breath?: No  Endocrine Excessive thirst?: No  Musculoskeletal Back pain?: No Joint pain?: No  Neurological Headaches?: No Dizziness?: No  Psychologic Depression?: No Anxiety?: No  Physical Exam: BP (!) 154/73 (BP Location: Right Arm, Patient Position: Sitting, Cuff Size: Normal)   Pulse 63   Ht 5\' 9"  (1.753 m)   Wt 201 lb 1.6 oz (91.2 kg)   BMI 29.70 kg/m   Constitutional:  Alert and oriented, No acute distress. HEENT: [place] AT,  moist mucus membranes.  Trachea midline, no masses. Cardiovascular: No clubbing, cyanosis, or edema. Respiratory: Normal respiratory effort, no increased work of breathing. GI: Abdomen is soft, nontender, nondistended, no abdominal masses GU: No CVA tenderness. Skin: No rashes, bruises or suspicious lesions. Lymph: No cervical or inguinal adenopathy. Neurologic: Grossly intact, no focal deficits, moving all 4 extremities. Psychiatric: Normal mood and affect.  Laboratory Data: Lab Results  Component Value Date   WBC 9.3 09/10/2017   HGB 9.4 (L) 09/10/2017   HCT 28.4 (L) 09/10/2017   MCV 95.1 09/10/2017   PLT 143 (L) 09/10/2017    Lab Results  Component Value Date   CREATININE 1.35 (H) 09/11/2017    No results found for: PSA  No results found for: TESTOSTERONE  No results found for: HGBA1C  Urinalysis    Component Value Date/Time   COLORURINE YELLOW (A) 09/08/2017 1337   APPEARANCEUR HAZY (A) 09/08/2017 1337    APPEARANCEUR Cloudy (A) 08/23/2017 1018   LABSPEC 1.018 09/08/2017 1337   PHURINE 5.0 09/08/2017 1337   GLUCOSEU NEGATIVE 09/08/2017 1337   HGBUR SMALL (A) 09/08/2017 1337   BILIRUBINUR NEGATIVE 09/08/2017 1337   BILIRUBINUR Negative 08/23/2017 1018   KETONESUR NEGATIVE 09/08/2017 1337   PROTEINUR 100 (A) 09/08/2017 1337   NITRITE NEGATIVE 09/08/2017 1337   LEUKOCYTESUR MODERATE (A) 09/08/2017 1337   LEUKOCYTESUR Trace (A) 08/23/2017 1018    Pertinent Imaging: Renal ultrasound reviewed as above   Assessment & Plan:    1. Left ureteral stones 2. Left ureterocele status post incision I discussed with the patient his renal ultrasound and stone analysis results. We discussed ways to prevent future stone formation. We went over the ABCs of stone formation tablet. He was informed that his residual tissue from a left ureterocele may be confused for a tumor on any future imaging. He'll follow-up with [place] as needed.   There are no diagnoses linked to this encounter.  Return if symptoms worsen or fail to improve.  Nickie Retort, MD  [facility] [address] [place], [place] [address] [phone]

## 2018-04-12 HISTORY — PX: LEFT HEART CATH AND CORONARY ANGIOGRAPHY: CATH118249

## 2018-06-26 ENCOUNTER — Other Ambulatory Visit: Payer: Self-pay | Admitting: Internal Medicine

## 2018-06-26 DIAGNOSIS — R0602 Shortness of breath: Secondary | ICD-10-CM

## 2018-07-04 ENCOUNTER — Ambulatory Visit
Admission: RE | Admit: 2018-07-04 | Discharge: 2018-07-04 | Disposition: A | Discharge status: Home / Self Care | Payer: Medicare Other | Source: Ambulatory Visit | Attending: Internal Medicine | Admitting: Internal Medicine

## 2018-07-04 DIAGNOSIS — R0602 Shortness of breath: Secondary | ICD-10-CM | POA: Diagnosis not present

## 2018-07-04 DIAGNOSIS — I700 Atherosclerosis of aorta: Secondary | ICD-10-CM | POA: Insufficient documentation

## 2018-07-04 DIAGNOSIS — J439 Emphysema, unspecified: Secondary | ICD-10-CM | POA: Diagnosis not present

## 2018-07-04 DIAGNOSIS — E041 Nontoxic single thyroid nodule: Secondary | ICD-10-CM | POA: Diagnosis not present

## 2018-07-04 MED ORDER — IOPAMIDOL (ISOVUE-300) INJECTION 61%
75.0000 mL | Freq: Once | INTRAVENOUS | Status: AC | PRN
  Administered 2018-07-04: 100 mL via INTRAVENOUS

## 2018-10-05 ENCOUNTER — Encounter: Payer: Self-pay | Admitting: *Deleted

## 2018-10-08 ENCOUNTER — Other Ambulatory Visit: Payer: Self-pay

## 2018-10-08 ENCOUNTER — Ambulatory Visit
Admission: RE | Admit: 2018-10-08 | Discharge: 2018-10-08 | Disposition: A | Discharge status: Home / Self Care | Payer: Medicare Other | Source: Ambulatory Visit | Attending: Unknown Physician Specialty | Admitting: Unknown Physician Specialty

## 2018-10-08 ENCOUNTER — Encounter
Admission: RE | Disposition: A | Discharge status: Home / Self Care | Payer: Self-pay | Source: Ambulatory Visit | Attending: Unknown Physician Specialty

## 2018-10-08 ENCOUNTER — Ambulatory Visit: Payer: Medicare Other | Admitting: Anesthesiology

## 2018-10-08 DIAGNOSIS — N189 Chronic kidney disease, unspecified: Secondary | ICD-10-CM | POA: Diagnosis not present

## 2018-10-08 DIAGNOSIS — D123 Benign neoplasm of transverse colon: Secondary | ICD-10-CM | POA: Insufficient documentation

## 2018-10-08 DIAGNOSIS — D122 Benign neoplasm of ascending colon: Secondary | ICD-10-CM | POA: Diagnosis not present

## 2018-10-08 DIAGNOSIS — Z1211 Encounter for screening for malignant neoplasm of colon: Secondary | ICD-10-CM | POA: Diagnosis not present

## 2018-10-08 DIAGNOSIS — Z7982 Long term (current) use of aspirin: Secondary | ICD-10-CM | POA: Insufficient documentation

## 2018-10-08 DIAGNOSIS — E785 Hyperlipidemia, unspecified: Secondary | ICD-10-CM | POA: Insufficient documentation

## 2018-10-08 DIAGNOSIS — I509 Heart failure, unspecified: Secondary | ICD-10-CM | POA: Diagnosis not present

## 2018-10-08 DIAGNOSIS — M1990 Unspecified osteoarthritis, unspecified site: Secondary | ICD-10-CM | POA: Insufficient documentation

## 2018-10-08 DIAGNOSIS — Z87891 Personal history of nicotine dependence: Secondary | ICD-10-CM | POA: Diagnosis not present

## 2018-10-08 DIAGNOSIS — K219 Gastro-esophageal reflux disease without esophagitis: Secondary | ICD-10-CM | POA: Diagnosis not present

## 2018-10-08 DIAGNOSIS — Z79899 Other long term (current) drug therapy: Secondary | ICD-10-CM | POA: Insufficient documentation

## 2018-10-08 DIAGNOSIS — I2510 Atherosclerotic heart disease of native coronary artery without angina pectoris: Secondary | ICD-10-CM | POA: Diagnosis not present

## 2018-10-08 DIAGNOSIS — I252 Old myocardial infarction: Secondary | ICD-10-CM | POA: Insufficient documentation

## 2018-10-08 DIAGNOSIS — Z955 Presence of coronary angioplasty implant and graft: Secondary | ICD-10-CM | POA: Diagnosis not present

## 2018-10-08 DIAGNOSIS — J449 Chronic obstructive pulmonary disease, unspecified: Secondary | ICD-10-CM | POA: Insufficient documentation

## 2018-10-08 DIAGNOSIS — I130 Hypertensive heart and chronic kidney disease with heart failure and stage 1 through stage 4 chronic kidney disease, or unspecified chronic kidney disease: Secondary | ICD-10-CM | POA: Diagnosis not present

## 2018-10-08 DIAGNOSIS — Z7901 Long term (current) use of anticoagulants: Secondary | ICD-10-CM | POA: Insufficient documentation

## 2018-10-08 DIAGNOSIS — Z87442 Personal history of urinary calculi: Secondary | ICD-10-CM | POA: Diagnosis not present

## 2018-10-08 HISTORY — DX: Personal history of urinary calculi: Z87.442

## 2018-10-08 HISTORY — PX: COLONOSCOPY WITH PROPOFOL: SHX5780

## 2018-10-08 HISTORY — DX: Chronic kidney disease, unspecified: N18.9

## 2018-10-08 HISTORY — DX: Unspecified osteoarthritis, unspecified site: M19.90

## 2018-10-08 SURGERY — COLONOSCOPY WITH PROPOFOL
Anesthesia: General

## 2018-10-08 MED ORDER — PIPERACILLIN-TAZOBACTAM 3.375 G IVPB
INTRAVENOUS | Status: AC
  Filled 2018-10-08: qty 50

## 2018-10-08 MED ORDER — SODIUM CHLORIDE 0.9 % IV SOLN
INTRAVENOUS | Status: DC
  Administered 2018-10-08: 10:00:00 via INTRAVENOUS

## 2018-10-08 MED ORDER — EPHEDRINE SULFATE 50 MG/ML IJ SOLN
INTRAMUSCULAR | Status: DC | PRN
  Administered 2018-10-08: 10 mg via INTRAVENOUS
  Administered 2018-10-08 (×2): 5 mg via INTRAVENOUS

## 2018-10-08 MED ORDER — PROPOFOL 500 MG/50ML IV EMUL
INTRAVENOUS | Status: AC
  Filled 2018-10-08: qty 50

## 2018-10-08 MED ORDER — EPHEDRINE SULFATE 50 MG/ML IJ SOLN
INTRAMUSCULAR | Status: AC
  Filled 2018-10-08: qty 1

## 2018-10-08 MED ORDER — SODIUM CHLORIDE 0.9 % IV SOLN: INTRAVENOUS | Status: DC

## 2018-10-08 MED ORDER — PIPERACILLIN-TAZOBACTAM 3.375 G IVPB 30 MIN
3.3750 g | Freq: Once | INTRAVENOUS | Status: DC
  Filled 2018-10-08: qty 50

## 2018-10-08 MED ORDER — FENTANYL CITRATE (PF) 100 MCG/2ML IJ SOLN
INTRAMUSCULAR | Status: DC | PRN
  Administered 2018-10-08: 25 ug via INTRAVENOUS
  Administered 2018-10-08: 50 ug via INTRAVENOUS
  Administered 2018-10-08: 25 ug via INTRAVENOUS

## 2018-10-08 MED ORDER — FENTANYL CITRATE (PF) 100 MCG/2ML IJ SOLN
INTRAMUSCULAR | Status: AC
Start: 2018-10-08 — End: ?
  Filled 2018-10-08: qty 2

## 2018-10-08 MED ORDER — PROPOFOL 500 MG/50ML IV EMUL
INTRAVENOUS | Status: DC | PRN
  Administered 2018-10-08: 100 ug/kg/min via INTRAVENOUS

## 2018-10-08 NOTE — Anesthesia Procedure Notes (Signed)
Performed by: Cook-Martin, Shandelle Borrelli Pre-anesthesia Checklist: Patient identified, Emergency Drugs available, Suction available, Patient being monitored and Timeout performed Patient Re-evaluated:Patient Re-evaluated prior to induction Oxygen Delivery Method: Nasal cannula Preoxygenation: Pre-oxygenation with 100% oxygen Induction Type: IV induction Placement Confirmation: positive ETCO2 and CO2 detector       

## 2018-10-08 NOTE — Anesthesia Post-op Follow-up Note (Signed)
Anesthesia QCDR form completed.        

## 2018-10-08 NOTE — Transfer of Care (Signed)
Immediate Anesthesia Transfer of Care Note  Patient: Todd Rojas  Procedure(s) Performed: COLONOSCOPY WITH PROPOFOL (N/A )  Patient Location: PACU  Anesthesia Type:MAC  Level of Consciousness: awake and sedated  Airway & Oxygen Therapy: Patient Spontanous Breathing and Patient connected to nasal cannula oxygen  Post-op Assessment: Report given to RN and Post -op Vital signs reviewed and stable  Post vital signs: Reviewed and stable  Last Vitals:  Vitals Value Taken Time  BP    Temp    Pulse    Resp    SpO2      Last Pain:  Vitals:   10/08/18 0957  TempSrc: Tympanic  PainSc: 0-No pain         Complications: No apparent anesthesia complications

## 2018-10-08 NOTE — Anesthesia Preprocedure Evaluation (Signed)
Anesthesia Evaluation  Patient identified by MRN, date of birth, ID band Patient awake    Reviewed: Allergy & Precautions, H&P , NPO status , Patient's Chart, lab work & pertinent test results  History of Anesthesia Complications Negative for: history of anesthetic complications  Airway Mallampati: III  TM Distance: <3 FB Neck ROM: limited    Dental  (+) Chipped, Poor Dentition   Pulmonary shortness of breath and with exertion, asthma , COPD, former smoker,           Cardiovascular Exercise Tolerance: Good hypertension, (-) angina+ CAD, + Past MI, + Cardiac Stents and +CHF       Neuro/Psych negative neurological ROS  negative psych ROS   GI/Hepatic Neg liver ROS, GERD  Medicated and Controlled,  Endo/Other  negative endocrine ROS  Renal/GU Renal disease  negative genitourinary   Musculoskeletal  (+) Arthritis ,   Abdominal   Peds  Hematology negative hematology ROS (+)   Anesthesia Other Findings Past Medical History: No date: Arthritis No date: Asthma 02/13/2012: CAD (coronary artery disease)     Comment:  Overview:   12/2004 - Anginal equivalent symptoms:  Left              arm pain, weakness, and shortness of breath.   12/2004:                Anterior STEMI.  Transferred to [facility].   01/06/2005 -               Cardiac cath:  EF 51%, an occluded mid LAD and a large               OM2 with 75% ostial stenosis.  PCI of the mid occluded               LAD using a Cypher stent.   03/2005 - 2D echo:  EF >55%,               LAE 4.7 cm, no significant valvular disease.   02/2008:                Plavix stopped.   06/2008:  Recurrent angina - symptoms               of chest tightness.  Presented to [facility].  Cardiac catheterization.  The patient               stated nonobstructive CAD seen.  Plavix restarted at that              time.   03/2009 - 2D echo:  EF 35%, akinetic apex and                septum.  Stress echocardiogram deferred for cardiac               catheterization.   03/30/2009:  PCI 90% proximal LAD with              a 3.0 x 18 mm Xience DES.   02/2010: Slow and gradual               return of chest tightness, fatigue and generalized               weakness concerning for return of anginal equivalent.                Stress test did not  show ischemia to the heart muscle               during exercise.  [place] No date: CHF (congestive heart failure) (HCC) No date: Chronic kidney disease     Comment:  kidney stone left side 07/03/2017: COPD (chronic obstructive pulmonary disease) ([place]) 02/13/2012: Dyslipidemia     Comment:  Overview:   03/2009  TC 158, TG 88, HDL 39, LDL 102.                11/2013  TC 148, TG 115, HDL 44.3, LDL 80.7 (done at               PCP's office)   10/2014 TC 146  11/2015 TC 123, TG 86,               HDL 41.7, LDL 64  11/2016 TC 140, TG 129, HDL 40.1, LDL               74  No date: Dyspnea No date: GERD (gastroesophageal reflux disease) 03/09/2013: GI disease     Comment:  Overview:   Dysphagia - EGD demonstrated esophageal               stricture which was dilated   GERD   Hemorrhoids  03/09/2013: Hearing loss No date: History of kidney stones 07/03/2017: Hyperlipidemia, unspecified 02/13/2012: Hypertension 2004: Myocardial infarction ([place]) 12/09/2016: Personal history of gout 05/05/2015: Primary osteoarthritis of both knees 04/14/2016: Right-sided low back pain without sciatica  Past Surgical History: No date: colonoscopy with polypectomy No date: coronary stents 08/09/2017: [place]; Left     Comment:  Procedure: CYSTOSCOPY WITH STENT PLACEMENT;  Surgeon:               Nickie Retort, MD;  Location: [facility] ORS;  Service:               Urology;  Laterality: Left; 2016: MEDIAL PARTIAL KNEE REPLACEMENT; Bilateral 08/09/2017: URETEROSCOPY WITH HOLMIUM LASER LITHOTRIPSY; Left     Comment:  Procedure:  URETEROSCOPY WITH HOLMIUM LASER               LITHOTRIPSY/INCISION OF URETEROCELE;  Surgeon: Nickie Retort, MD;  Location: [facility] ORS;  Service: Urology;               Laterality: Left;  BMI    Body Mass Index:  29.83 kg/m      Reproductive/Obstetrics negative OB ROS                             Anesthesia Physical Anesthesia Plan  ASA: III  Anesthesia Plan: General   Post-op Pain Management:    Induction: Intravenous  PONV Risk Score and Plan: Propofol infusion and TIVA  Airway Management Planned: Natural Airway and Nasal Cannula  Additional Equipment:   Intra-op Plan:   Post-operative Plan:   Informed Consent: I have reviewed the patients History and Physical, chart, labs and discussed the procedure including the risks, benefits and alternatives for the proposed anesthesia with the patient or authorized representative who has indicated his/her understanding and acceptance.   Dental Advisory Given  Plan Discussed with: Anesthesiologist, CRNA and Surgeon  Anesthesia Plan Comments: (Patient consented for risks of anesthesia including but not limited to:  - adverse reactions to medications - risk of intubation if required - damage to  teeth, lips or other oral mucosa - sore throat or hoarseness - Damage to heart, brain, lungs or loss of life  Patient voiced understanding.)        Anesthesia Quick Evaluation

## 2018-10-08 NOTE — H&P (Signed)
Primary Care Physician:  Tracie Harrier, MD Primary Gastroenterologist:  Dr. Vira Agar  Pre-Procedure History & Physical: HPI:  Todd Rojas is a 77 y.o. male is here for an colonoscopy.   Past Medical History:  Diagnosis Date  . Arthritis   . Asthma   . CAD (coronary artery disease) 02/13/2012   Overview:   12/2004 - Anginal equivalent symptoms:  Left arm pain, weakness, and shortness of breath.   12/2004:  Anterior STEMI.  Transferred to [facility].   01/06/2005 - Cardiac cath:  EF 51%, an occluded mid LAD and a large OM2 with 75% ostial stenosis.  PCI of the mid occluded LAD using a Cypher stent.   03/2005 - 2D echo:  EF >55%, LAE 4.7 cm, no significant valvular disease.   02/2008:  Plavix stopped.   06/2008:  Recurrent angina - symptoms of chest tightness.  Presented to [facility].  Cardiac catheterization.  The patient stated nonobstructive CAD seen.  Plavix restarted at that time.   03/2009 - 2D echo:  EF 35%, akinetic apex and septum.  Stress echocardiogram deferred for cardiac catheterization.   03/30/2009:  PCI 90% proximal LAD with a 3.0 x 18 mm Xience DES.   02/2010: Slow and gradual return of chest tightness, fatigue and generalized weakness concerning for return of anginal equivalent.  Stress test did not show ischemia to the heart muscle during exercise.  [place]enc  . CHF (congestive heart failure) ([place])   . Chronic kidney disease    kidney stone left side  . COPD (chronic obstructive pulmonary disease) ([place]) 07/03/2017  . Dyslipidemia 02/13/2012   Overview:   03/2009  TC 158, TG 88, HDL 39, LDL 102.  11/2013  TC 148, TG 115, HDL 44.3, LDL 80.7 (done at PCP's office)   10/2014 TC 146  11/2015 TC 123, TG 86, HDL 41.7, LDL 64  11/2016 TC 140, TG 129, HDL 40.1, LDL 74   . Dyspnea   . GERD (gastroesophageal reflux disease)   . GI disease 03/09/2013   Overview:   Dysphagia - EGD demonstrated esophageal stricture which was dilated   GERD   Hemorrhoids   .  Hearing loss 03/09/2013  . History of kidney stones   . Hyperlipidemia, unspecified 07/03/2017  . Hypertension 02/13/2012  . Myocardial infarction ([place]) 2004  . Personal history of gout 12/09/2016  . Primary osteoarthritis of both knees 05/05/2015  . Right-sided low back pain without sciatica 04/14/2016    Past Surgical History:  Procedure Laterality Date  . colonoscopy with polypectomy    . coronary stents    . CYSTOSCOPY WITH STENT PLACEMENT Left 08/09/2017   Procedure: CYSTOSCOPY WITH STENT PLACEMENT;  Surgeon: Nickie Retort, MD;  Location: [facility] ORS;  Service: Urology;  Laterality: Left;  [place] MEDIAL PARTIAL KNEE REPLACEMENT Bilateral 2016  . URETEROSCOPY WITH HOLMIUM LASER LITHOTRIPSY Left 08/09/2017   Procedure: URETEROSCOPY WITH HOLMIUM LASER LITHOTRIPSY/INCISION OF URETEROCELE;  Surgeon: Nickie Retort, MD;  Location: [facility] ORS;  Service: Urology;  Laterality: Left;    Prior to Admission medications   Medication Sig Start Date End Date Taking? Authorizing Provider  albuterol (PROVENTIL HFA;VENTOLIN HFA) 108 (90 Base) MCG/ACT inhaler Inhale 1 puff into the lungs every 6 (six) hours as needed for wheezing or shortness of breath.   Yes [provider]  allopurinol (ZYLOPRIM) 300 MG tablet Take 300 mg by mouth daily.  01/31/17  Yes [provider]  aspirin EC 81 MG tablet Take 81 mg by  mouth daily.   Yes [provider]  colchicine 0.6 MG tablet Take 0.6 mg by mouth daily.   Yes [provider]  furosemide (LASIX) 20 MG tablet Take 20 mg by mouth daily.   Yes [provider]  lisinopril (PRINIVIL,ZESTRIL) 40 MG tablet Take 40 mg by mouth daily.   Yes [provider]  RIVAROXABAN PO STUDY DRUG - 2.5mg  twice daily   Yes [provider]  vitamin B-12 (CYANOCOBALAMIN) 1000 MCG tablet Take 1,000 mcg by mouth daily.   Yes [provider]  acetaminophen (TYLENOL) 500 MG tablet Take 1,000 mg by mouth every 8 (eight)  hours as needed for mild pain.    [provider]  cyanocobalamin (,VITAMIN B-12,) 1000 MCG/ML injection Inject 1,000 mcg into the muscle every 30 (thirty) days.  01/02/17   [provider]  pravastatin (PRAVACHOL) 80 MG tablet Take 80 mg by mouth every evening.  06/12/17   [provider]  UNABLE TO FIND Take 100 mg by mouth daily. Study drug: Aspirin 100 mg  Dr Posey Pronto @@ [facility] is monitoring this study    [provider]  VANISHPOINT TUBERCULIN SYRINGE 25G X 1" 1 ML MISC U UTD 07/05/17   [provider]    Allergies as of 07/30/2018  . (No Known Allergies)    Family History  Problem Relation Age of Onset  . Prostate cancer Neg Hx   . Kidney cancer Neg Hx   . Bladder Cancer Neg Hx     Social History   Socioeconomic History  . Marital status: Married    Spouse name: Not on file  . Number of children: Not on file  . Years of education: Not on file  . Highest education level: Not on file  Occupational History  . Not on file  Social Needs  . Financial resource strain: Not on file  . Food insecurity:    Worry: Not on file    Inability: Not on file  . Transportation needs:    Medical: Not on file    Non-medical: Not on file  Tobacco Use  . Smoking status: Former Smoker    Last attempt to quit: 07/26/1985    Years since quitting: 33.2  . Smokeless tobacco: Former Systems developer    Types: [place] date: 08/07/1994  Substance and Sexual Activity  . Alcohol use: No  . Drug use: No  . Sexual activity: Not on file  Lifestyle  . Physical activity:    Days per week: Not on file    Minutes per session: Not on file  . Stress: Not on file  Relationships  . Social connections:    Talks on phone: Not on file    Gets together: Not on file    Attends religious service: Not on file    Active member of club or organization: Not on file    Attends meetings of clubs or organizations: Not on file    Relationship status: Not on file  . Intimate partner  violence:    Fear of current or ex partner: Not on file    Emotionally abused: Not on file    Physically abused: Not on file    Forced sexual activity: Not on file  Other Topics Concern  . Not on file  Social History Narrative  . Not on file    Review of Systems: See HPI, otherwise negative ROS  Physical Exam: BP 132/68   Pulse (!) 55  Temp (!) 97.1 F (36.2 C) (Tympanic)   Resp 18   Ht 5\' 9"  (1.753 m)   Wt 91.6 kg   SpO2 98%   BMI 29.83 kg/m  General:   Alert,  pleasant and cooperative in NAD Head:  Normocephalic and atraumatic. Neck:  Supple; no masses or thyromegaly. Lungs:  Clear throughout to auscultation.    Heart:  Regular rate and rhythm. Abdomen:  Soft, nontender and nondistended. Normal bowel sounds, without guarding, and without rebound.   Neurologic:  Alert and  oriented x4;  grossly normal neurologically.  Impression/Plan: Todd Rojas is here for an colonoscopy to be performed for [facility] colon polyps 04/29/2013  Risks, benefits, limitations, and alternatives regarding  colonoscopy have been reviewed with the patient.  Questions have been answered.  All parties agreeable.   Gaylyn Cheers, MD  10/08/2018, 10:12 AM

## 2018-10-08 NOTE — Anesthesia Postprocedure Evaluation (Signed)
Anesthesia Post Note  Patient: Todd Rojas  Procedure(s) Performed: COLONOSCOPY WITH PROPOFOL (N/A )  Patient location during evaluation: Endoscopy Anesthesia Type: General Level of consciousness: awake and alert Pain management: pain level controlled Vital Signs Assessment: post-procedure vital signs reviewed and stable Respiratory status: spontaneous breathing, nonlabored ventilation, respiratory function stable and patient connected to nasal cannula oxygen Cardiovascular status: blood pressure returned to baseline and stable Postop Assessment: no apparent nausea or vomiting Anesthetic complications: no     Last Vitals:  Vitals:   10/08/18 1106 10/08/18 1116  BP: 134/65 127/66  Pulse: 71 67  Resp: 19 18  Temp:    SpO2: 95% 96%    Last Pain:  Vitals:   10/08/18 1116  TempSrc:   PainSc: 0-No pain                 [name]

## 2018-10-08 NOTE — Op Note (Signed)
[facility] Gastroenterology Patient Name: Todd Rojas Procedure Date: 10/08/2018 10:02 AM MRN: [id number] Account #: [id number] Date of Birth: 1941/01/29 Admit Type: Outpatient Age: 77 Room: [facility] ENDO ROOM 3 Gender: Male Note Status: Finalized Procedure:            Colonoscopy Indications:          High risk colon cancer surveillance: Personal history                        of colonic polyps Providers:            Manya Silvas, MD Referring MD:         Tracie Harrier, MD (Referring MD) Medicines:            Propofol per Anesthesia Complications:        No immediate complications. Procedure:            Pre-Anesthesia Assessment:                       - After reviewing the risks and benefits, the patient                        was deemed in satisfactory condition to undergo the                        procedure.                       After obtaining informed consent, the colonoscope was                        passed under direct vision. Throughout the procedure,                        the patient's blood pressure, pulse, and oxygen                        saturations were monitored continuously. The                        Colonoscope was introduced through the anus and                        advanced to the the cecum, identified by appendiceal                        orifice and ileocecal valve. The colonoscopy was                        performed without difficulty. The patient tolerated the                        procedure well. The quality of the bowel preparation                        was good. Findings:      A diminutive polyp was found in the ascending colon. The polyp was       sessile. The polyp was removed with a jumbo cold forceps. Resection and       retrieval were complete.      A small polyp was found  in the ascending colon. The polyp was sessile.       The polyp was removed with a jumbo cold forceps. Resection and retrieval       were  complete. To prevent bleeding after the polypectomy, one hemostatic       clip was successfully placed. There was no bleeding at the end of the       procedure.      A diminutive polyp was found in the hepatic flexure. The polyp was       sessile. The polyp was removed with a jumbo cold forceps. Resection and       retrieval were complete. Impression:           - One diminutive polyp in the ascending colon, removed                        with a jumbo cold forceps. Resected and retrieved.                       - One small polyp in the ascending colon, removed with                        a jumbo cold forceps. Resected and retrieved. Clip was                        placed.                       - One diminutive polyp at the hepatic flexure, removed                        with a jumbo cold forceps. Resected and retrieved. Recommendation:       - Await pathology results. Manya Silvas, MD 10/08/2018 10:45:46 AM This report has been signed electronically. Number of Addenda: 0 Note Initiated On: 10/08/2018 10:02 AM Scope Withdrawal Time: 0 hours 9 minutes 38 seconds  Total Procedure Duration: 0 hours 20 minutes 46 seconds       [facility]

## 2018-10-09 ENCOUNTER — Encounter: Payer: Self-pay | Admitting: Unknown Physician Specialty

## 2018-10-09 LAB — SURGICAL PATHOLOGY

## 2020-03-17 ENCOUNTER — Other Ambulatory Visit: Payer: Self-pay

## 2020-03-17 ENCOUNTER — Ambulatory Visit (INDEPENDENT_AMBULATORY_CARE_PROVIDER_SITE_OTHER): Payer: Medicare Other | Admitting: Dermatology

## 2020-03-17 DIAGNOSIS — C44612 Basal cell carcinoma of skin of right upper limb, including shoulder: Secondary | ICD-10-CM

## 2020-03-17 DIAGNOSIS — C4491 Basal cell carcinoma of skin, unspecified: Secondary | ICD-10-CM

## 2020-03-17 HISTORY — DX: Basal cell carcinoma of skin, unspecified: C44.91

## 2020-03-17 MED ORDER — MUPIROCIN 2 % EX OINT: 1.0000 "application " | TOPICAL_OINTMENT | Freq: Every day | CUTANEOUS | 0 refills | Status: DC

## 2020-03-17 NOTE — Progress Notes (Signed)
   Follow-Up Visit   Subjective  Royall Colton is a 79 y.o. male who presents for the following: Basal Cell Carcinoma (right ant lat deltoid - surgery today.).    The following portions of the chart were reviewed this encounter and updated as appropriate:     Review of Systems: No other skin or systemic complaints.  Objective  Well appearing patient in no apparent distress; mood and affect are within normal limits.  A focused examination was performed including right arm. Relevant physical exam findings are noted in the Assessment and Plan.  Objective  Right ant lat deltoid: Pink pearly papule or plaque with arborizing vessels.   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 Right ant lat deltoid  Skin excision  Lesion length (cm):  1.7 Lesion width (cm):  1 Margin per side (cm):  0.2 Total excision diameter (cm):  2.1 Informed consent: discussed and consent obtained   Timeout: patient name, date of birth, surgical site, and procedure verified   Procedure prep:  Patient was prepped and draped in usual sterile fashion Prep type:  Isopropyl alcohol and povidone-iodine Anesthesia: the lesion was anesthetized in a standard fashion   Anesthetic:  1% lidocaine w/ epinephrine 1-100,000 buffered w/ 8.4% NaHCO3 (10cc) Instrument used: #15 blade   Hemostasis achieved with: pressure   Hemostasis achieved with comment:  Electrocautery Outcome: patient tolerated procedure well with no complications   Post-procedure details: sterile dressing applied and wound care instructions given   Dressing type: bandage and pressure dressing (mupirocin)    Skin repair Complexity:  Complex Final length (cm):  4.5 Reason for type of repair: reduce tension to allow closure, reduce the risk of dehiscence, infection, and necrosis, reduce subcutaneous dead space and avoid a hematoma, allow closure of the large defect, preserve normal anatomy,  preserve normal anatomical and functional relationships and enhance both functionality and cosmetic results   Undermining: area extensively undermined   Undermining comment:  Undermining defect 1.4cm Subcutaneous layers (deep stitches):  Suture size:  2-0 Suture type: Vicryl (polyglactin 910)   Subcutaneous suture technique: inverted dermal. Fine/surface layer approximation (top stitches):  Suture size:  2-0 Suture type comment:  Nylon Stitches: simple running   Suture removal (days):  7 Hemostasis achieved with: suture and pressure Outcome: patient tolerated procedure well with no complications   Post-procedure details: sterile dressing applied and wound care instructions given   Dressing type: bandage and pressure dressing (mupirocin)    Specimen 1 - Surgical pathology Differential Diagnosis: BCC Check Margins: Yes Healing biopsy site [phone]  Suture tag at posterior 12:00 tip  Return in about 1 week (around 03/24/2020) for suture removal.   I, Ashok Cordia, CMA, am acting as scribe for Sarina Ser, MD .

## 2020-03-17 NOTE — Patient Instructions (Signed)

## 2020-03-24 ENCOUNTER — Other Ambulatory Visit: Payer: Self-pay

## 2020-03-24 ENCOUNTER — Ambulatory Visit (INDEPENDENT_AMBULATORY_CARE_PROVIDER_SITE_OTHER): Payer: Medicare Other | Admitting: Dermatology

## 2020-03-24 DIAGNOSIS — L570 Actinic keratosis: Secondary | ICD-10-CM | POA: Diagnosis not present

## 2020-03-24 DIAGNOSIS — Z85828 Personal history of other malignant neoplasm of skin: Secondary | ICD-10-CM

## 2020-03-24 DIAGNOSIS — Z4802 Encounter for removal of sutures: Secondary | ICD-10-CM

## 2020-03-24 DIAGNOSIS — L578 Other skin changes due to chronic exposure to nonionizing radiation: Secondary | ICD-10-CM

## 2020-03-24 NOTE — Patient Instructions (Signed)
After Suture Removal ° °1. After sutures are removed, the wound should be coated with an antibiotic ointment (eg. Polysporin, Bacitracin) and, if possible, kept covered with a Band-Aid or bandage for an additional 24 hours.  After that, no additional wound care is generally needed.  °2. It is alright to get the area wet. °3. If a skin cancer was removed, your skin should be re-examined in approximately three months. ° ° ° °

## 2020-03-24 NOTE — Progress Notes (Signed)
   Follow-Up Visit   Subjective  Todd Rojas is a 79 y.o. male who presents for the following: post op./suture removal (pathology proven margins clear BCC - healing well per patient, no issues.).  The following portions of the chart were reviewed this encounter and updated as appropriate:     Review of Systems: No other skin or systemic complaints.  Objective  Well appearing patient in no apparent distress; mood and affect are within normal limits.  A focused examination was performed including the trunk, face, and ears. Relevant physical exam findings are noted in the Assessment and Plan.  Objective  R anti tragus: Erythematous thin papules/macules with gritty scale.   Objective  Face, trunk, extremities: Diffuse scaly erythematous macules with underlying dyspigmentation.   Objective  R ant lat deltoid: Healing excision site.  Assessment & Plan  AK (actinic keratosis) R anti tragus  Destruction of lesion - R anti tragus Complexity: simple   Destruction method: cryotherapy   Informed consent: discussed and consent obtained   Timeout:  patient name, date of birth, surgical site, and procedure verified Lesion destroyed using liquid nitrogen: Yes   Region frozen until ice ball extended beyond lesion: Yes   Outcome: patient tolerated procedure well with no complications   Post-procedure details: wound care instructions given    Actinic skin damage Face, trunk, extremities  Recommend daily broad spectrum sunscreen SPF 30+ to sun-exposed areas, reapply every 2 hours as needed. Call for new or changing lesions.   History of basal cell carcinoma (BCC) R ant lat deltoid  Area was cleansed with H2O2, sutures were removed, area was cleansed again with H2O2, patted dry, then steri strips were applied. Discussed pathology result with patient and wound care.   Return for appointment as scheduled.   Tanya Nones, CMA, am acting as scribe for Sarina Ser, MD .

## 2020-06-03 ENCOUNTER — Other Ambulatory Visit: Payer: Self-pay

## 2020-06-03 ENCOUNTER — Ambulatory Visit (INDEPENDENT_AMBULATORY_CARE_PROVIDER_SITE_OTHER): Payer: Medicare Other | Admitting: Dermatology

## 2020-06-03 DIAGNOSIS — C44222 Squamous cell carcinoma of skin of right ear and external auricular canal: Secondary | ICD-10-CM

## 2020-06-03 DIAGNOSIS — L820 Inflamed seborrheic keratosis: Secondary | ICD-10-CM

## 2020-06-03 DIAGNOSIS — L578 Other skin changes due to chronic exposure to nonionizing radiation: Secondary | ICD-10-CM | POA: Diagnosis not present

## 2020-06-03 DIAGNOSIS — C4492 Squamous cell carcinoma of skin, unspecified: Secondary | ICD-10-CM

## 2020-06-03 DIAGNOSIS — L821 Other seborrheic keratosis: Secondary | ICD-10-CM

## 2020-06-03 DIAGNOSIS — D485 Neoplasm of uncertain behavior of skin: Secondary | ICD-10-CM

## 2020-06-03 HISTORY — DX: Squamous cell carcinoma of skin, unspecified: C44.92

## 2020-06-03 NOTE — Patient Instructions (Signed)

## 2020-06-03 NOTE — Progress Notes (Signed)
Follow-Up Visit   Subjective  Stepan Verrette is a 79 y.o. male who presents for the following: lesion (R ear - treated in the past with LN2 but patient has noticed no improvement ).  The following portions of the chart were reviewed this encounter and updated as appropriate:  Tobacco  Allergies  Meds  Problems  Med Hx  Surg Hx  Fam Hx     Review of Systems:  No other skin or systemic complaints except as noted in HPI or Assessment and Plan.  Objective  Well appearing patient in no apparent distress; mood and affect are within normal limits.  A focused examination was performed including the right ear. Relevant physical exam findings are noted in the Assessment and Plan.  Objective  Right Ear: 0.5 cm hyperkeratotic papule   Objective  B/L arm x 9 (9): Erythematous keratotic or waxy stuck-on papule or plaque.   Assessment & Plan    Neoplasm of uncertain behavior of skin Right Ear  Epidermal / dermal shaving  Lesion diameter (cm):  0.5 Informed consent: discussed and consent obtained   Timeout: patient name, date of birth, surgical site, and procedure verified   Procedure prep:  Patient was prepped and draped in usual sterile fashion Prep type:  Isopropyl alcohol Anesthesia: the lesion was anesthetized in a standard fashion   Anesthetic:  1% lidocaine w/ epinephrine 1-100,000 buffered w/ 8.4% NaHCO3 Instrument used: flexible razor blade   Hemostasis achieved with: pressure, aluminum chloride and electrodesiccation   Outcome: patient tolerated procedure well   Post-procedure details: sterile dressing applied and wound care instructions given   Dressing type: bandage and petrolatum   Additional details:  Post tx defect 0.9 cm  Destruction of lesion Complexity: extensive   Destruction method: electrodesiccation and curettage   Informed consent: discussed and consent obtained   Timeout:  patient name, date of birth, surgical site, and procedure  verified Procedure prep:  Patient was prepped and draped in usual sterile fashion Prep type:  Isopropyl alcohol Anesthesia: the lesion was anesthetized in a standard fashion   Anesthetic:  1% lidocaine w/ epinephrine 1-100,000 buffered w/ 8.4% NaHCO3 Curettage performed in three different directions: Yes   Electrodesiccation performed over the curetted area: Yes   Lesion length (cm):  0.5 Lesion width (cm):  0.5 Margin per side (cm):  0.2 Final wound size (cm):  0.9 Hemostasis achieved with:  pressure, aluminum chloride and electrodesiccation Outcome: patient tolerated procedure well with no complications   Post-procedure details: sterile dressing applied and wound care instructions given   Dressing type: bandage and petrolatum    Specimen 1 - Surgical pathology Differential Diagnosis: D48.5 r/o SCC Check Margins: No 0.5 cm hyperkeratotic papule  Inflamed seborrheic keratosis (9) B/L arm x 9  Destruction of lesion - B/L arm x 9 Complexity: simple   Destruction method: cryotherapy   Informed consent: discussed and consent obtained   Timeout:  patient name, date of birth, surgical site, and procedure verified Lesion destroyed using liquid nitrogen: Yes   Region frozen until ice ball extended beyond lesion: Yes   Outcome: patient tolerated procedure well with no complications   Post-procedure details: wound care instructions given    SCC (squamous cell carcinoma), ear, right  Actinic Damage - diffuse scaly erythematous macules with underlying dyspigmentation - Recommend daily broad spectrum sunscreen SPF 30+ to sun-exposed areas, reapply every 2 hours as needed.  - Call for new or changing lesions.  Seborrheic Keratoses - Stuck-on, waxy, tan-brown papules  and plaques  - Discussed benign etiology and prognosis. - Observe - Call for any changes  Return for appointment as scheduled.  Luther Redo, CMA, am acting as scribe for Sarina Ser, MD .  Documentation: I have  reviewed the above documentation for accuracy and completeness, and I agree with the above.  Sarina Ser, MD

## 2020-06-07 ENCOUNTER — Encounter: Payer: Self-pay | Admitting: Dermatology

## 2020-06-08 ENCOUNTER — Telehealth: Payer: Self-pay

## 2020-06-08 NOTE — Telephone Encounter (Signed)
-----   Message from Ralene Bathe, MD sent at 06/05/2020  7:35 PM EDT ----- Skin , right ear WELL DIFFERENTIATED SQUAMOUS CELL CARCINOMA WITH SUPERFICIAL INFILTRATION  Cancer - SCC Already treated Recheck next visit

## 2020-06-08 NOTE — Telephone Encounter (Signed)
Patient informed of pathology results 

## 2020-08-26 ENCOUNTER — Ambulatory Visit (INDEPENDENT_AMBULATORY_CARE_PROVIDER_SITE_OTHER): Payer: Medicare Other | Admitting: Dermatology

## 2020-08-26 ENCOUNTER — Encounter: Payer: Self-pay | Admitting: Dermatology

## 2020-08-26 ENCOUNTER — Other Ambulatory Visit: Payer: Self-pay

## 2020-08-26 DIAGNOSIS — D229 Melanocytic nevi, unspecified: Secondary | ICD-10-CM

## 2020-08-26 DIAGNOSIS — D1800 Hemangioma unspecified site: Secondary | ICD-10-CM

## 2020-08-26 DIAGNOSIS — L814 Other melanin hyperpigmentation: Secondary | ICD-10-CM

## 2020-08-26 DIAGNOSIS — Z1283 Encounter for screening for malignant neoplasm of skin: Secondary | ICD-10-CM

## 2020-08-26 DIAGNOSIS — L570 Actinic keratosis: Secondary | ICD-10-CM | POA: Diagnosis not present

## 2020-08-26 DIAGNOSIS — Z85828 Personal history of other malignant neoplasm of skin: Secondary | ICD-10-CM

## 2020-08-26 DIAGNOSIS — L821 Other seborrheic keratosis: Secondary | ICD-10-CM

## 2020-08-26 DIAGNOSIS — L820 Inflamed seborrheic keratosis: Secondary | ICD-10-CM

## 2020-08-26 DIAGNOSIS — L810 Postinflammatory hyperpigmentation: Secondary | ICD-10-CM | POA: Diagnosis not present

## 2020-08-26 DIAGNOSIS — D692 Other nonthrombocytopenic purpura: Secondary | ICD-10-CM

## 2020-08-26 DIAGNOSIS — L578 Other skin changes due to chronic exposure to nonionizing radiation: Secondary | ICD-10-CM

## 2020-08-26 NOTE — Progress Notes (Signed)
Follow-Up Visit   Subjective  Todd Rojas is a 79 y.o. male who presents for the following: UBSE (Hx BCC, SCC, AK's, - patient has noticed a dark macule on his R lower leg that has been there for a few months and he would like checked). The patient presents for Upper Body Skin Exam (UBSE) for skin cancer screening and mole check.  The following portions of the chart were reviewed this encounter and updated as appropriate:  Tobacco  Allergies  Meds  Problems  Med Hx  Surg Hx  Fam Hx     Review of Systems:  No other skin or systemic complaints except as noted in HPI or Assessment and Plan.  Objective  Well appearing patient in no apparent distress; mood and affect are within normal limits.  All skin waist up examined.  Objective  R lower leg: Hyperpigmentation  Objective  Face and scalp (17): Erythematous thin papules/macules with gritty scale.   Objective  R cheek: Erythematous keratotic or waxy stuck-on papule or plaque.   Assessment & Plan  Post-inflammatory hyperpigmentation R lower leg  Benign, observe.    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  Discussed PDT - recommend treating the face   Destruction of lesion - Face and scalp Complexity: simple   Destruction method: cryotherapy   Informed consent: discussed and consent obtained   Timeout:  patient name, date of birth, surgical site, and procedure verified Lesion destroyed using liquid nitrogen: Yes   Region frozen until ice ball extended beyond lesion: Yes   Outcome: patient tolerated procedure well with no complications   Post-procedure details: wound care instructions given    Inflamed seborrheic keratosis R cheek  Destruction of lesion - R cheek Complexity: simple   Destruction method: cryotherapy   Informed consent: discussed and consent obtained   Timeout:  patient name, date of birth, surgical site, and procedure verified Lesion destroyed using liquid nitrogen: Yes   Region  frozen until ice ball extended beyond lesion: Yes   Outcome: patient tolerated procedure well with no complications   Post-procedure details: wound care instructions given    Skin cancer screening   Lentigines - Scattered tan macules - Discussed due to sun exposure - Benign, observe - Call for any changes  Seborrheic Keratoses - Stuck-on, waxy, tan-brown papules and plaques  - Discussed benign etiology and prognosis. - Observe - Call for any changes  Melanocytic Nevi - Tan-brown and/or pink-flesh-colored symmetric macules and papules - Benign appearing on exam today - Observation - Call clinic for new or changing moles - Recommend daily use of broad spectrum spf 30+ sunscreen to sun-exposed areas.   Hemangiomas - Red papules - Discussed benign nature - Observe - Call for any changes  Actinic Damage - diffuse scaly erythematous macules with underlying dyspigmentation - Recommend daily broad spectrum sunscreen SPF 30+ to sun-exposed areas, reapply every 2 hours as needed.  - Call for new or changing lesions.  History of Squamous Cell Carcinoma of the Skin - R ear treated with ED&C at previous visit - No evidence of recurrence today - No lymphadenopathy - Recommend regular full body skin exams - Recommend daily broad spectrum sunscreen SPF 30+ to sun-exposed areas, reapply every 2 hours as needed.  - Call if any new or changing lesions are noted between office visits  History of Basal Cell Carcinoma of the Skin - R ant lat deltoid treated  03/17/20 - No evidence of recurrence today - Recommend regular full body  skin exams - Recommend daily broad spectrum sunscreen SPF 30+ to sun-exposed areas, reapply every 2 hours as needed.  - Call if any new or changing lesions are noted between office visits  Purpura - Violaceous macules and patches - Benign - Related to age, sun damage and/or use of blood thinners - Observe - Can use OTC arnica containing moisturizer such as  Dermend Bruise Formula if desired - Call for worsening or other concerns  Skin cancer screening performed today.  Return in about 6 months (around 02/23/2021) for AK follow up; in 2-8 weeks for PDT of the face .  Luther Redo, CMA, am acting as scribe for Sarina Ser, MD .  Documentation: I have reviewed the above documentation for accuracy and completeness, and I agree with the above.  Sarina Ser, MD

## 2020-09-21 ENCOUNTER — Other Ambulatory Visit: Payer: Self-pay

## 2020-09-21 ENCOUNTER — Ambulatory Visit (INDEPENDENT_AMBULATORY_CARE_PROVIDER_SITE_OTHER): Payer: Medicare Other

## 2020-09-21 DIAGNOSIS — L570 Actinic keratosis: Secondary | ICD-10-CM | POA: Diagnosis not present

## 2020-09-21 MED ORDER — AMINOLEVULINIC ACID HCL 20 % EX SOLR
1.0000 "application " | Freq: Once | CUTANEOUS | Status: AC
  Administered 2020-09-21: 354 mg via TOPICAL

## 2020-09-21 NOTE — Progress Notes (Addendum)
1. AK (actinic keratosis) Face  Photodynamic therapy - Face Procedure discussed: discussed risks, benefits, side effects. and alternatives   Prep: site scrubbed/prepped with acetone   Location:  Face Number of lesions:  Multiple Type of treatment:  Blue light Aminolevulinic Acid (see MAR for details): Levulan Number of Levulan sticks used:  1 Incubation time (minutes):  60 Number of minutes under lamp:  16 Number of seconds under lamp:  40 Cooling:  Floor fan Outcome: patient tolerated procedure well with no complications   Post-procedure details: sunscreen applied and aftercare instructions given to patient

## 2020-09-21 NOTE — Addendum Note (Signed)
Addended by: Harriett Sine on: 09/21/2020 09:21 AM   Modules accepted: Orders

## 2020-09-21 NOTE — Patient Instructions (Signed)

## 2021-02-22 ENCOUNTER — Encounter: Payer: Self-pay | Admitting: Dermatology

## 2021-02-22 ENCOUNTER — Ambulatory Visit (INDEPENDENT_AMBULATORY_CARE_PROVIDER_SITE_OTHER): Payer: Medicare Other | Admitting: Dermatology

## 2021-02-22 ENCOUNTER — Other Ambulatory Visit: Payer: Self-pay

## 2021-02-22 DIAGNOSIS — L570 Actinic keratosis: Secondary | ICD-10-CM

## 2021-02-22 DIAGNOSIS — D692 Other nonthrombocytopenic purpura: Secondary | ICD-10-CM

## 2021-02-22 DIAGNOSIS — L578 Other skin changes due to chronic exposure to nonionizing radiation: Secondary | ICD-10-CM | POA: Diagnosis not present

## 2021-02-22 NOTE — Progress Notes (Signed)
   Follow-Up Visit   Subjective  Todd Rojas is a 80 y.o. male who presents for the following: Actinic Keratosis (S/P PDT of the face - patient is here today to check for new or persistent skin lesions).  The following portions of the chart were reviewed this encounter and updated as appropriate:   Tobacco  Allergies  Meds  Problems  Med Hx  Surg Hx  Fam Hx     Review of Systems:  No other skin or systemic complaints except as noted in HPI or Assessment and Plan.  Objective  Well appearing patient in no apparent distress; mood and affect are within normal limits.  A focused examination was performed including the face and scalp . Relevant physical exam findings are noted in the Assessment and Plan.  Objective  Forehead & nose x 8, scalp x 6 (14): Erythematous thin papules/macules with gritty scale.   Assessment & Plan  AK (actinic keratosis) (14) Forehead & nose x 8, scalp x 6  Destruction of lesion - Forehead & nose x 8, scalp x 6 Complexity: simple   Destruction method: cryotherapy   Informed consent: discussed and consent obtained   Timeout:  patient name, date of birth, surgical site, and procedure verified Lesion destroyed using liquid nitrogen: Yes   Region frozen until ice ball extended beyond lesion: Yes   Outcome: patient tolerated procedure well with no complications   Post-procedure details: wound care instructions given     Actinic Damage - chronic, secondary to cumulative UV radiation exposure/sun exposure over time - diffuse scaly erythematous macules with underlying dyspigmentation - Recommend daily broad spectrum sunscreen SPF 30+ to sun-exposed areas, reapply every 2 hours as needed.  - Recommend staying in the shade or wearing long sleeves, sun glasses (UVA+UVB protection) and wide brim hats (4-inch brim around the entire circumference of the hat). - Call for new or changing lesions.  Purpura - Chronic; persistent and recurrent.  Treatable,  but not curable. - Violaceous macules and patches - Benign - Related to trauma, age, sun damage and/or use of blood thinners, chronic use of topical and/or oral steroids - Observe - Can use OTC arnica containing moisturizer such as Dermend Bruise Formula if desired - Call for worsening or other concerns  Return in about 6 months (around 08/25/2021) for TBSE - HX BCC, SCC, AK's .  Luther Redo, CMA, am acting as scribe for Sarina Ser, MD .  Documentation: I have reviewed the above documentation for accuracy and completeness, and I agree with the above.  Sarina Ser, MD

## 2021-02-23 ENCOUNTER — Encounter: Payer: Self-pay | Admitting: Dermatology

## 2021-06-03 HISTORY — PX: CORONARY ANGIOPLASTY WITH STENT PLACEMENT: SHX49

## 2021-07-01 ENCOUNTER — Other Ambulatory Visit: Payer: Self-pay

## 2021-07-01 ENCOUNTER — Encounter: Payer: Medicare Other | Attending: Internal Medicine

## 2021-07-01 DIAGNOSIS — Z955 Presence of coronary angioplasty implant and graft: Secondary | ICD-10-CM

## 2021-07-01 DIAGNOSIS — Z9861 Coronary angioplasty status: Secondary | ICD-10-CM | POA: Insufficient documentation

## 2021-07-01 NOTE — Progress Notes (Signed)
Virtual Visit completed. Patient informed on EP and RD appointment and 6 Minute walk test. Patient also informed of patient health questionnaires on My Chart. Patient Verbalizes understanding. Visit diagnosis can be found in [facility] 06/03/2021.

## 2021-07-12 ENCOUNTER — Other Ambulatory Visit: Payer: Self-pay

## 2021-07-12 ENCOUNTER — Encounter: Payer: Medicare Other | Admitting: *Deleted

## 2021-07-12 VITALS — Ht 68.4 in | Wt 208.3 lb

## 2021-07-12 DIAGNOSIS — Z955 Presence of coronary angioplasty implant and graft: Secondary | ICD-10-CM

## 2021-07-12 DIAGNOSIS — Z9861 Coronary angioplasty status: Secondary | ICD-10-CM | POA: Diagnosis present

## 2021-07-12 NOTE — Patient Instructions (Signed)
Patient Instructions  Patient Details  Name: Todd Rojas MRN: [id number] Date of Birth: Oct 05, 1941 Referring Provider:  Dionne Ano, MD  Below are your personal goals for exercise, nutrition, and risk factors. Our goal is to help you stay on track towards obtaining and maintaining these goals. We will be discussing your progress on these goals with you throughout the program.  Initial Exercise Prescription:  Initial Exercise Prescription - 07/12/21 1100       Date of Initial Exercise RX and Referring Provider   Date 07/12/21    Referring Provider Dionne Ano MD      Treadmill   MPH 2.2    Grade 1    Minutes 15    METs 2.99      Recumbant Bike   Level 2    RPM 50    Watts 21    Minutes 15    METs 2.5      REL-XR   Level 1    Speed 50    Minutes 15    METs 2.5      T5 Nustep   Level 2    SPM 80    Minutes 15    METs 2.5      Prescription Details   Frequency (times per week) 2    Duration Progress to 30 minutes of continuous aerobic without signs/symptoms of physical distress      Intensity   THRR 40-80% of Max Heartrate 88-123    Ratings of Perceived Exertion 11-13    Perceived Dyspnea 0-4      Progression   Progression Continue to progress workloads to maintain intensity without signs/symptoms of physical distress.      Resistance Training   Training Prescription Yes    Weight 4 lb    Reps 10-15             Exercise Goals: Frequency: Be able to perform aerobic exercise two to three times per week in program working toward 2-5 days per week of home exercise.  Intensity: Work with a perceived exertion of 11 (fairly light) - 15 (hard) while following your exercise prescription.  We will make changes to your prescription with you as you progress through the program.   Duration: Be able to do 30 to 45 minutes of continuous aerobic exercise in addition to a 5 minute warm-up and a 5 minute cool-down routine.   Nutrition Goals: Your  personal nutrition goals will be established when you do your nutrition analysis with the dietician.  The following are general nutrition guidelines to follow: Cholesterol < '200mg'$ /day Sodium < '1500mg'$ /day Fiber: Men over 50 yrs - 30 grams per day  Personal Goals:  Personal Goals and Risk Factors at Admission - 07/12/21 1116       Core Components/Risk Factors/Patient Goals on Admission    Weight Management Yes;Weight Loss;Obesity    Intervention Weight Management: Develop a combined nutrition and exercise program designed to reach desired caloric intake, while maintaining appropriate intake of nutrient and fiber, sodium and fats, and appropriate energy expenditure required for the weight goal.;Weight Management: Provide education and appropriate resources to help participant work on and attain dietary goals.;Weight Management/Obesity: Establish reasonable short term and long term weight goals.;Obesity: Provide education and appropriate resources to help participant work on and attain dietary goals.    Admit Weight 208 lb 3.2 oz (94.4 kg)    Goal Weight: Short Term 203 lb (92.1 kg)    Goal Weight: Long Term 198 lb (89.8 kg)  Expected Outcomes Short Term: Continue to assess and modify interventions until short term weight is achieved;Long Term: Adherence to nutrition and physical activity/exercise program aimed toward attainment of established weight goal;Weight Loss: Understanding of general recommendations for a balanced deficit meal plan, which promotes 1-2 lb weight loss per week and includes a negative energy balance of [phone] kcal/d;Understanding recommendations for meals to include 15-35% energy as protein, 25-35% energy from fat, 35-60% energy from carbohydrates, less than '200mg'$  of dietary cholesterol, 20-35 gm of total fiber daily;Understanding of distribution of calorie intake throughout the day with the consumption of 4-5 meals/snacks    Heart Failure Yes    Intervention Provide a  combined exercise and nutrition program that is supplemented with education, support and counseling about heart failure. Directed toward relieving symptoms such as shortness of breath, decreased exercise tolerance, and extremity edema.    Expected Outcomes Improve functional capacity of life;Short term: Attendance in program 2-3 days a week with increased exercise capacity. Reported lower sodium intake. Reported increased fruit and vegetable intake. Reports medication compliance.;Short term: Daily weights obtained and reported for increase. Utilizing diuretic protocols set by physician.;Long term: Adoption of self-care skills and reduction of barriers for early signs and symptoms recognition and intervention leading to self-care maintenance.    Hypertension Yes    Intervention Provide education on lifestyle modifcations including regular physical activity/exercise, weight management, moderate sodium restriction and increased consumption of fresh fruit, vegetables, and low fat dairy, alcohol moderation, and smoking cessation.;Monitor prescription use compliance.    Expected Outcomes Short Term: Continued assessment and intervention until BP is < 140/4m HG in hypertensive participants. < 130/839mHG in hypertensive participants with diabetes, heart failure or chronic kidney disease.;Long Term: Maintenance of blood pressure at goal levels.    Lipids Yes    Intervention Provide education and support for participant on nutrition & aerobic/resistive exercise along with prescribed medications to achieve LDL '70mg'$ , HDL >'40mg'$ .    Expected Outcomes Short Term: Participant states understanding of desired cholesterol values and is compliant with medications prescribed. Participant is following exercise prescription and nutrition guidelines.;Long Term: Cholesterol controlled with medications as prescribed, with individualized exercise RX and with personalized nutrition plan. Value goals: LDL < '70mg'$ , HDL > 40 mg.              Tobacco Use Initial Evaluation: Social History   Tobacco Use  Smoking Status Former  Smokeless Tobacco Former   Types: Chew   Quit date: 08/07/1994    Exercise Goals and Review:  Exercise Goals     Row Name 07/12/21 1114             Exercise Goals   Increase Physical Activity Yes       Intervention Provide advice, education, support and counseling about physical activity/exercise needs.;Develop an individualized exercise prescription for aerobic and resistive training based on initial evaluation findings, risk stratification, comorbidities and participant's personal goals.       Expected Outcomes Short Term: Attend rehab on a regular basis to increase amount of physical activity.;Long Term: Add in home exercise to make exercise part of routine and to increase amount of physical activity.;Long Term: Exercising regularly at least 3-5 days a week.       Increase Strength and Stamina Yes       Intervention Provide advice, education, support and counseling about physical activity/exercise needs.;Develop an individualized exercise prescription for aerobic and resistive training based on initial evaluation findings, risk stratification, comorbidities and participant's personal goals.  Expected Outcomes Short Term: Increase workloads from initial exercise prescription for resistance, speed, and METs.;Short Term: Perform resistance training exercises routinely during rehab and add in resistance training at home;Long Term: Improve cardiorespiratory fitness, muscular endurance and strength as measured by increased METs and functional capacity (6MWT)       Able to understand and use rate of perceived exertion (RPE) scale Yes       Intervention Provide education and explanation on how to use RPE scale       Expected Outcomes Short Term: Able to use RPE daily in rehab to express subjective intensity level;Long Term:  Able to use RPE to guide intensity level when exercising  independently       Able to understand and use Dyspnea scale Yes       Intervention Provide education and explanation on how to use Dyspnea scale       Expected Outcomes Short Term: Able to use Dyspnea scale daily in rehab to express subjective sense of shortness of breath during exertion;Long Term: Able to use Dyspnea scale to guide intensity level when exercising independently       Knowledge and understanding of Target Heart Rate Range (THRR) Yes       Intervention Provide education and explanation of THRR including how the numbers were predicted and where they are located for reference       Expected Outcomes Short Term: Able to state/look up THRR;Short Term: Able to use daily as guideline for intensity in rehab;Long Term: Able to use THRR to govern intensity when exercising independently       Able to check pulse independently Yes       Intervention Provide education and demonstration on how to check pulse in carotid and radial arteries.;Review the importance of being able to check your own pulse for safety during independent exercise       Expected Outcomes Short Term: Able to explain why pulse checking is important during independent exercise;Long Term: Able to check pulse independently and accurately       Understanding of Exercise Prescription Yes       Intervention Provide education, explanation, and written materials on patient's individual exercise prescription       Expected Outcomes Short Term: Able to explain program exercise prescription;Long Term: Able to explain home exercise prescription to exercise independently                Copy of goals given to participant.

## 2021-07-12 NOTE — Progress Notes (Signed)
Cardiac Individual Treatment Plan  Patient Details  Name: Todd Rojas MRN: [id number] Date of Birth: 06/07/1941 Referring Provider:   Flowsheet Row Cardiac Rehab from 07/12/2021 in [facility] Cardiac and Pulmonary Rehab  Referring Provider Dionne Ano MD       Initial Encounter Date:  Flowsheet Row Cardiac Rehab from 07/12/2021 in [facility] Cardiac and Pulmonary Rehab  Date 07/12/21       Visit Diagnosis: Status post coronary artery stent placement  Patient's Home Medications on Admission:  Current Outpatient Medications:    acetaminophen (TYLENOL) 500 MG tablet, Take 1,000 mg by mouth every 8 (eight) hours as needed for mild pain., Disp: , Rfl:    albuterol (PROVENTIL HFA;VENTOLIN HFA) 108 (90 Base) MCG/ACT inhaler, Inhale 1 puff into the lungs every 6 (six) hours as needed for wheezing or shortness of breath., Disp: , Rfl:    allopurinol (ZYLOPRIM) 300 MG tablet, Take 300 mg by mouth daily. , Disp: , Rfl:    amoxicillin (AMOXIL) 500 MG capsule, Take by mouth., Disp: , Rfl:    aspirin EC 81 MG tablet, Take 81 mg by mouth daily., Disp: , Rfl:    budesonide-formoterol (SYMBICORT) 160-4.5 MCG/ACT inhaler, Inhale into the lungs., Disp: , Rfl:    clopidogrel (PLAVIX) 75 MG tablet, Take by mouth., Disp: , Rfl:    colchicine 0.6 MG tablet, Take 0.6 mg by mouth daily. (Patient not taking: Reported on 07/01/2021), Disp: , Rfl:    cyanocobalamin (,VITAMIN B-12,) 1000 MCG/ML injection, Inject 1,000 mcg into the muscle every 30 (thirty) days. , Disp: , Rfl:    furosemide (LASIX) 20 MG tablet, Take 20 mg by mouth daily. (Patient not taking: Reported on 07/01/2021), Disp: , Rfl:    lisinopril (PRINIVIL,ZESTRIL) 40 MG tablet, Take 40 mg by mouth daily. (Patient not taking: Reported on 07/01/2021), Disp: , Rfl:    lisinopril (ZESTRIL) 20 MG tablet, Take 20 mg by mouth daily., Disp: , Rfl:    mupirocin ointment (BACTROBAN) 2 %, Apply 1 application topically daily. With dressing changes (Patient not  taking: No sig reported), Disp: 22 g, Rfl: 0   nitroGLYCERIN (NITROSTAT) 0.4 MG SL tablet, Place under the tongue., Disp: , Rfl:    pravastatin (PRAVACHOL) 80 MG tablet, Take 80 mg by mouth every evening. , Disp: , Rfl:    RIVAROXABAN PO, STUDY DRUG - 2.57m twice daily (Patient not taking: Reported on 07/01/2021), Disp: , Rfl:    UNABLE TO FIND, Take 100 mg by mouth daily. Study drug: Aspirin 100 mg  Dr PPosey Pronto@@ [facility] is monitoring this study (Patient not taking: Reported on 07/01/2021), Disp: , Rfl:    VANISHPOINT TUBERCULIN SYRINGE 25G X 1" 1 ML MISC, U UTD (Patient not taking: Reported on 07/01/2021), Disp: , Rfl: 5   vitamin B-12 (CYANOCOBALAMIN) 1000 MCG tablet, Take 1,000 mcg by mouth daily. (Patient not taking: Reported on 07/01/2021), Disp: , Rfl:   Past Medical History: Past Medical History:  Diagnosis Date   Actinic keratosis    Arthritis    Asthma    Basal cell carcinoma 03/17/2020   right ant lat deltoid   CAD (coronary artery disease) 02/13/2012   Overview:   12/2004 - Anginal equivalent symptoms:  Left arm pain, weakness, and shortness of breath.   12/2004:  [facility] STEMI.  [facility]   01/06/2005 - Cardiac cath:  EF 51%, an occluded mid LAD and a large OM2 with 75% ostial stenosis.  PCI of the mid occluded LAD using a Cypher  stent.   03/2005 - 2D echo:  EF >55%, LAE 4.7 cm, no significant valvular disease.   02/2008:  Plavix stopped.   06/2008:  Recurrent angina - symptoms of chest tightness.  Presented to [facility].  Cardiac catheterization.  The patient stated nonobstructive CAD seen.  Plavix restarted at that time.   03/2009 - 2D echo:  EF 35%, akinetic apex and septum.  Stress echocardiogram deferred for cardiac catheterization.   03/30/2009:  PCI 90% proximal LAD with a 3.0 x 18 mm Xience DES.   02/2010: Slow and gradual return of chest tightness, fatigue and generalized weakness concerning for return of anginal equivalent.  Stress test did not show  ischemia to the heart muscle during exercise.  Evidenc   CHF (congestive heart failure) (HCC)    Chronic kidney disease    kidney stone left side   COPD (chronic obstructive pulmonary disease) ([place]) 07/03/2017   Dyslipidemia 02/13/2012   Overview:   03/2009  TC 158, TG 88, HDL 39, LDL 102.  11/2013  TC 148, TG 115, HDL 44.3, LDL 80.7 (done at PCP's office)   10/2014 TC 146  11/2015 TC 123, TG 86, HDL 41.7, LDL 64  11/2016 TC 140, TG 129, HDL 40.1, LDL 74    Dyspnea    GERD (gastroesophageal reflux disease)    GI disease 03/09/2013   Overview:   Dysphagia - EGD demonstrated esophageal stricture which was dilated   GERD   Hemorrhoids    Hearing loss 03/09/2013   History of kidney stones    Hyperlipidemia, unspecified 07/03/2017   Hypertension 02/13/2012   Myocardial infarction [facility]) 2004   Personal history of gout 12/09/2016   Primary osteoarthritis of both knees 05/05/2015   Right-sided low back pain without sciatica 04/14/2016   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   R ear - ED&C    Tobacco Use: Social History   Tobacco Use  Smoking Status Former  Smokeless Tobacco Former   Types: Chew   Quit date: 08/07/1994    Labs: Recent Review Flowsheet Data   There is no flowsheet data to display.      Exercise Target Goals: Exercise Program Goal: Individual exercise prescription set using results from initial 6 min walk test and THRR while considering  patient's activity barriers and safety.   Exercise Prescription Goal: Initial exercise prescription builds to 30-45 minutes a day of aerobic activity, 2-3 days per week.  Home exercise guidelines will be given to patient during program as part of exercise prescription that the participant will acknowledge.   Education: Aerobic Exercise: - Group verbal and visual presentation on the components of exercise prescription. Introduces F.I.T.T principle from ACSM for exercise prescriptions.  Reviews F.I.T.T. principles of aerobic  exercise including progression. Written material given at graduation. Flowsheet Row Cardiac Rehab from 07/12/2021 in [facility] Cardiac and Pulmonary Rehab  Education need identified 07/12/21       Education: Resistance Exercise: - Group verbal and visual presentation on the components of exercise prescription. Introduces F.I.T.T principle from ACSM for exercise prescriptions  Reviews F.I.T.T. principles of resistance exercise including progression. Written material given at graduation. Flowsheet Row Cardiac Rehab from 07/12/2021 in [facility] Cardiac and Pulmonary Rehab  Education need identified 07/12/21        Education: Exercise & Equipment Safety: - Individual verbal instruction and demonstration of equipment use and safety with use of the equipment. Flowsheet Row Cardiac Rehab from 07/12/2021 in [facility] Cardiac and Pulmonary Rehab  Date 07/01/21  Educator [facility]  Instruction Review Code 1- Verbalizes Understanding       Education: Exercise Physiology & General Exercise Guidelines: - Group verbal and written instruction with models to review the exercise physiology of the cardiovascular system and associated critical values. Provides general exercise guidelines with specific guidelines to those with heart or lung disease.  Flowsheet Row Cardiac Rehab from 07/12/2021 in [facility] Cardiac and Pulmonary Rehab  Education need identified 07/12/21       Education: Flexibility, Balance, Mind/Body Relaxation: - Group verbal and visual presentation with interactive activity on the components of exercise prescription. Introduces F.I.T.T principle from ACSM for exercise prescriptions. Reviews F.I.T.T. principles of flexibility and balance exercise training including progression. Also discusses the mind body connection.  Reviews various relaxation techniques to help reduce and manage stress (i.e. Deep breathing, progressive muscle relaxation, and visualization). Balance handout provided to take home. Written material  given at graduation.   Activity Barriers & Risk Stratification:  Activity Barriers & Cardiac Risk Stratification - 07/12/21 1112       Activity Barriers & Cardiac Risk Stratification   Activity Barriers Left Knee Replacement;Right Knee Replacement;Deconditioning    Cardiac Risk Stratification High             6 Minute Walk:  6 Minute Walk     Row Name 07/12/21 1108         6 Minute Walk   Phase Initial     Distance 1480 feet     Walk Time 6 minutes     # of Rest Breaks 0     MPH 2.8     METS 2.71     RPE 9     VO2 Peak 9.5     Symptoms No     Resting HR 53 bpm     Resting BP 130/66     Resting Oxygen Saturation  95 %     Exercise Oxygen Saturation  during 6 min walk 94 %     Max Ex. HR 113 bpm     Max Ex. BP 146/64     2 Minute Post BP 122/64              Oxygen Initial Assessment:   Oxygen Re-Evaluation:   Oxygen Discharge (Final Oxygen Re-Evaluation):   Initial Exercise Prescription:  Initial Exercise Prescription - 07/12/21 1100       Date of Initial Exercise RX and Referring Provider   Date 07/12/21    Referring Provider Dionne Ano MD      Treadmill   MPH 2.2    Grade 1    Minutes 15    METs 2.99      Recumbant Bike   Level 2    RPM 50    Watts 21    Minutes 15    METs 2.5      REL-XR   Level 1    Speed 50    Minutes 15    METs 2.5      T5 Nustep   Level 2    SPM 80    Minutes 15    METs 2.5      Prescription Details   Frequency (times per week) 2    Duration Progress to 30 minutes of continuous aerobic without signs/symptoms of physical distress      Intensity   THRR 40-80% of Max Heartrate 88-123    Ratings of Perceived Exertion 11-13    Perceived Dyspnea 0-4      Progression  Progression Continue to progress workloads to maintain intensity without signs/symptoms of physical distress.      Resistance Training   Training Prescription Yes    Weight 4 lb    Reps 10-15             Perform Capillary  Blood Glucose checks as needed.  Exercise Prescription Changes:   Exercise Prescription Changes     Row Name 07/12/21 1100             Response to Exercise   Blood Pressure (Admit) 130/66       Blood Pressure (Exercise) 146/64       Blood Pressure (Exit) 122/64       Heart Rate (Admit) 53 bpm       Heart Rate (Exercise) 113 bpm       Heart Rate (Exit) 58 bpm       Oxygen Saturation (Admit) 95 %       Oxygen Saturation (Exercise) 94 %       Rating of Perceived Exertion (Exercise) 9       Symptoms none       Comments walk test results                Exercise Comments:   Exercise Goals and Review:   Exercise Goals     Row Name 07/12/21 1114             Exercise Goals   Increase Physical Activity Yes       Intervention Provide advice, education, support and counseling about physical activity/exercise needs.;Develop an individualized exercise prescription for aerobic and resistive training based on initial evaluation findings, risk stratification, comorbidities and participant's personal goals.       Expected Outcomes Short Term: Attend rehab on a regular basis to increase amount of physical activity.;Long Term: Add in home exercise to make exercise part of routine and to increase amount of physical activity.;Long Term: Exercising regularly at least 3-5 days a week.       Increase Strength and Stamina Yes       Intervention Provide advice, education, support and counseling about physical activity/exercise needs.;Develop an individualized exercise prescription for aerobic and resistive training based on initial evaluation findings, risk stratification, comorbidities and participant's personal goals.       Expected Outcomes Short Term: Increase workloads from initial exercise prescription for resistance, speed, and METs.;Short Term: Perform resistance training exercises routinely during rehab and add in resistance training at home;Long Term: Improve cardiorespiratory  fitness, muscular endurance and strength as measured by increased METs and functional capacity (6MWT)       Able to understand and use rate of perceived exertion (RPE) scale Yes       Intervention Provide education and explanation on how to use RPE scale       Expected Outcomes Short Term: Able to use RPE daily in rehab to express subjective intensity level;Long Term:  Able to use RPE to guide intensity level when exercising independently       Able to understand and use Dyspnea scale Yes       Intervention Provide education and explanation on how to use Dyspnea scale       Expected Outcomes Short Term: Able to use Dyspnea scale daily in rehab to express subjective sense of shortness of breath during exertion;Long Term: Able to use Dyspnea scale to guide intensity level when exercising independently       Knowledge and understanding of Target Heart Rate Range (  THRR) Yes       Intervention Provide education and explanation of THRR including how the numbers were predicted and where they are located for reference       Expected Outcomes Short Term: Able to state/look up THRR;Short Term: Able to use daily as guideline for intensity in rehab;Long Term: Able to use THRR to govern intensity when exercising independently       Able to check pulse independently Yes       Intervention Provide education and demonstration on how to check pulse in carotid and radial arteries.;Review the importance of being able to check your own pulse for safety during independent exercise       Expected Outcomes Short Term: Able to explain why pulse checking is important during independent exercise;Long Term: Able to check pulse independently and accurately       Understanding of Exercise Prescription Yes       Intervention Provide education, explanation, and written materials on patient's individual exercise prescription       Expected Outcomes Short Term: Able to explain program exercise prescription;Long Term: Able to explain  home exercise prescription to exercise independently                Exercise Goals Re-Evaluation :   Discharge Exercise Prescription (Final Exercise Prescription Changes):  Exercise Prescription Changes - 07/12/21 1100       Response to Exercise   Blood Pressure (Admit) 130/66    Blood Pressure (Exercise) 146/64    Blood Pressure (Exit) 122/64    Heart Rate (Admit) 53 bpm    Heart Rate (Exercise) 113 bpm    Heart Rate (Exit) 58 bpm    Oxygen Saturation (Admit) 95 %    Oxygen Saturation (Exercise) 94 %    Rating of Perceived Exertion (Exercise) 9    Symptoms none    Comments walk test results             Nutrition:  Target Goals: Understanding of nutrition guidelines, daily intake of sodium '1500mg'$ , cholesterol '200mg'$ , calories 30% from fat and 7% or less from saturated fats, daily to have 5 or more servings of fruits and vegetables.  Education: All About Nutrition: -Group instruction provided by verbal, written material, interactive activities, discussions, models, and posters to present general guidelines for heart healthy nutrition including fat, fiber, MyPlate, the role of sodium in heart healthy nutrition, utilization of the nutrition label, and utilization of this knowledge for meal planning. Follow up email sent as well. Written material given at graduation. Flowsheet Row Cardiac Rehab from 07/12/2021 in [facility] Cardiac and Pulmonary Rehab  Education need identified 07/12/21       Biometrics:  Pre Biometrics - 07/12/21 1114       Pre Biometrics   Height 5' 8.4" (1.737 m)    Weight 208 lb 4.8 oz (94.5 kg)    BMI (Calculated) 31.32    Single Leg Stand 12.6 seconds              Nutrition Therapy Plan and Nutrition Goals:   Nutrition Assessments:  MEDIFICTS Score Key: ?70 Need to make dietary changes  40-70 Heart Healthy Diet ? 40 Therapeutic Level Cholesterol Diet  Flowsheet Row Cardiac Rehab from 07/12/2021 in [facility] Cardiac and Pulmonary Rehab   Picture Your Plate Total Score on Admission 65      Picture Your Plate Scores: [id number] Unhealthy dietary pattern with much room for improvement. 41-50 Dietary pattern unlikely to meet recommendations for good health and room  for improvement. 51-60 More healthful dietary pattern, with some room for improvement.  >60 Healthy dietary pattern, although there may be some specific behaviors that could be improved.    Nutrition Goals Re-Evaluation:   Nutrition Goals Discharge (Final Nutrition Goals Re-Evaluation):   Psychosocial: Target Goals: Acknowledge presence or absence of significant depression and/or stress, maximize coping skills, provide positive support system. Participant is able to verbalize types and ability to use techniques and skills needed for reducing stress and depression.   Education: Stress, Anxiety, and Depression - Group verbal and visual presentation to define topics covered.  Reviews how body is impacted by stress, anxiety, and depression.  Also discusses healthy ways to reduce stress and to treat/manage anxiety and depression.  Written material given at graduation. Flowsheet Row Cardiac Rehab from 07/12/2021 in [facility] Cardiac and Pulmonary Rehab  Education need identified 07/12/21       Education: Sleep Hygiene -Provides group verbal and written instruction about how sleep can affect your health.  Define sleep hygiene, discuss sleep cycles and impact of sleep habits. Review good sleep hygiene tips.    Initial Review & Psychosocial Screening:  Initial Psych Review & Screening - 07/01/21 0924       Initial Review   Current issues with None Identified      Family Dynamics   Good Support System? Yes    Comments He can look to his wife, son, daughter and preacher for support. He has a positive outlook on his mental health      Barriers   Psychosocial barriers to participate in program There are no identifiable barriers or psychosocial needs.;The patient should  benefit from training in stress management and relaxation.      Screening Interventions   Interventions Encouraged to exercise;To provide support and resources with identified psychosocial needs;Provide feedback about the scores to participant    Expected Outcomes Short Term goal: Utilizing psychosocial counselor, staff and physician to assist with identification of specific Stressors or current issues interfering with healing process. Setting desired goal for each stressor or current issue identified.;Long Term Goal: Stressors or current issues are controlled or eliminated.;Short Term goal: Identification and review with participant of any Quality of Life or Depression concerns found by scoring the questionnaire.;Long Term goal: The participant improves quality of Life and PHQ9 Scores as seen by post scores and/or verbalization of changes             Quality of Life Scores:   Quality of Life - 07/12/21 1114       Quality of Life   Select Quality of Life      Quality of Life Scores   Health/Function Pre 26.8 %    Socioeconomic Pre 30 %    Psych/Spiritual Pre 30 %    Family Pre 30 %    GLOBAL Pre 28.63 %            Scores of 19 and below usually indicate a poorer quality of life in these areas.  A difference of  2-3 points is a clinically meaningful difference.  A difference of 2-3 points in the total score of the Quality of Life Index has been associated with significant improvement in overall quality of life, self-image, physical symptoms, and general health in studies assessing change in quality of life.  PHQ-9: Recent Review Flowsheet Data     Depression screen [facility] 2/9 07/12/2021   Decreased Interest 0   Down, Depressed, Hopeless 0   PHQ - 2 Score 0  Altered sleeping 0   Tired, decreased energy 0   Change in appetite 0   Feeling bad or failure about yourself  0   Trouble concentrating 0   Moving slowly or fidgety/restless 0   Suicidal thoughts 0   PHQ-9 Score 0    Difficult doing work/chores Not difficult at all      Interpretation of Total Score  Total Score Depression Severity:  1-4 = Minimal depression, 5-9 = Mild depression, 10-14 = Moderate depression, 15-19 = Moderately severe depression, 20-27 = Severe depression   Psychosocial Evaluation and Intervention:  Psychosocial Evaluation - 07/01/21 0925       Psychosocial Evaluation & Interventions   Interventions Encouraged to exercise with the program and follow exercise prescription;Relaxation education;Stress management education    Comments He can look to his wife, son, daughter and preacher for support. He has a positive outlook on his mental health    Expected Outcomes Short: Exercise regularly to support mental health and notify staff of any changes. Long: maintain mental health and well being through teaching of rehab or prescribed medications independently.    Continue Psychosocial Services  Follow up required by staff             Psychosocial Re-Evaluation:   Psychosocial Discharge (Final Psychosocial Re-Evaluation):   Vocational Rehabilitation: Provide vocational rehab assistance to qualifying candidates.   Vocational Rehab Evaluation & Intervention:   Education: Education Goals: Education classes will be provided on a variety of topics geared toward better understanding of heart health and risk factor modification. Participant will state understanding/return demonstration of topics presented as noted by education test scores.  Learning Barriers/Preferences:  Learning Barriers/Preferences - 07/01/21 0923       Learning Barriers/Preferences   Learning Barriers None    Learning Preferences None             General Cardiac Education Topics:  AED/CPR: - Group verbal and written instruction with the use of models to demonstrate the basic use of the AED with the basic ABC's of resuscitation.   Anatomy and Cardiac Procedures: - Group verbal and visual  presentation and models provide information about basic cardiac anatomy and function. Reviews the testing methods done to diagnose heart disease and the outcomes of the test results. Describes the treatment choices: Medical Management, Angioplasty, or Coronary Bypass Surgery for treating various heart conditions including Myocardial Infarction, Angina, Valve Disease, and Cardiac Arrhythmias.  Written material given at graduation.   Medication Safety: - Group verbal and visual instruction to review commonly prescribed medications for heart and lung disease. Reviews the medication, class of the drug, and side effects. Includes the steps to properly store meds and maintain the prescription regimen.  Written material given at graduation.   Intimacy: - Group verbal instruction through game format to discuss how heart and lung disease can affect sexual intimacy. Written material given at graduation..   Know Your Numbers and Heart Failure: - Group verbal and visual instruction to discuss disease risk factors for cardiac and pulmonary disease and treatment options.  Reviews associated critical values for Overweight/Obesity, Hypertension, Cholesterol, and Diabetes.  Discusses basics of heart failure: signs/symptoms and treatments.  Introduces Heart Failure Zone chart for action plan for heart failure.  Written material given at graduation.   Infection Prevention: - Provides verbal and written material to individual with discussion of infection control including proper hand washing and proper equipment cleaning during exercise session. Flowsheet Row Cardiac Rehab from 07/12/2021 in [facility] Cardiac and Pulmonary Rehab  Date 07/01/21  Educator [facility]  Instruction Review Code 1- Verbalizes Understanding       Falls Prevention: - Provides verbal and written material to individual with discussion of falls prevention and safety. Flowsheet Row Cardiac Rehab from 07/12/2021 in [facility] Cardiac and Pulmonary Rehab  Date  07/01/21  Educator [facility]  Instruction Review Code 1- Verbalizes Understanding       Other: -Provides group and verbal instruction on various topics (see comments)   Knowledge Questionnaire Score:  Knowledge Questionnaire Score - 07/12/21 1116       Knowledge Questionnaire Score   Pre Score 16/26 education focus: angina, MI, pulse, depression, exercise, nutrition             Core Components/Risk Factors/Patient Goals at Admission:  Personal Goals and Risk Factors at Admission - 07/12/21 1116       Core Components/Risk Factors/Patient Goals on Admission    Weight Management Yes;Weight Loss;Obesity    Intervention Weight Management: Develop a combined nutrition and exercise program designed to reach desired caloric intake, while maintaining appropriate intake of nutrient and fiber, sodium and fats, and appropriate energy expenditure required for the weight goal.;Weight Management: Provide education and appropriate resources to help participant work on and attain dietary goals.;Weight Management/Obesity: Establish reasonable short term and long term weight goals.;Obesity: Provide education and appropriate resources to help participant work on and attain dietary goals.    Admit Weight 208 lb 3.2 oz (94.4 kg)    Goal Weight: Short Term 203 lb (92.1 kg)    Goal Weight: Long Term 198 lb (89.8 kg)    Expected Outcomes Short Term: Continue to assess and modify interventions until short term weight is achieved;Long Term: Adherence to nutrition and physical activity/exercise program aimed toward attainment of established weight goal;Weight Loss: Understanding of general recommendations for a balanced deficit meal plan, which promotes 1-2 lb weight loss per week and includes a negative energy balance of [phone] kcal/d;Understanding recommendations for meals to include 15-35% energy as protein, 25-35% energy from fat, 35-60% energy from carbohydrates, less than '200mg'$  of dietary cholesterol, 20-35  gm of total fiber daily;Understanding of distribution of calorie intake throughout the day with the consumption of 4-5 meals/snacks    Heart Failure Yes    Intervention Provide a combined exercise and nutrition program that is supplemented with education, support and counseling about heart failure. Directed toward relieving symptoms such as shortness of breath, decreased exercise tolerance, and extremity edema.    Expected Outcomes Improve functional capacity of life;Short term: Attendance in program 2-3 days a week with increased exercise capacity. Reported lower sodium intake. Reported increased fruit and vegetable intake. Reports medication compliance.;Short term: Daily weights obtained and reported for increase. Utilizing diuretic protocols set by physician.;Long term: Adoption of self-care skills and reduction of barriers for early signs and symptoms recognition and intervention leading to self-care maintenance.    Hypertension Yes    Intervention Provide education on lifestyle modifcations including regular physical activity/exercise, weight management, moderate sodium restriction and increased consumption of fresh fruit, vegetables, and low fat dairy, alcohol moderation, and smoking cessation.;Monitor prescription use compliance.    Expected Outcomes Short Term: Continued assessment and intervention until BP is < 140/36m HG in hypertensive participants. < 130/817mHG in hypertensive participants with diabetes, heart failure or chronic kidney disease.;Long Term: Maintenance of blood pressure at goal levels.    Lipids Yes    Intervention Provide education and support for participant on nutrition & aerobic/resistive exercise along with prescribed medications to achieve  LDL '70mg'$ , HDL >'40mg'$ .    Expected Outcomes Short Term: Participant states understanding of desired cholesterol values and is compliant with medications prescribed. Participant is following exercise prescription and nutrition  guidelines.;Long Term: Cholesterol controlled with medications as prescribed, with individualized exercise RX and with personalized nutrition plan. Value goals: LDL < '70mg'$ , HDL > 40 mg.             Education:Diabetes - Individual verbal and written instruction to review signs/symptoms of diabetes, desired ranges of glucose level fasting, after meals and with exercise. Acknowledge that pre and post exercise glucose checks will be done for 3 sessions at entry of program.   Core Components/Risk Factors/Patient Goals Review:    Core Components/Risk Factors/Patient Goals at Discharge (Final Review):    ITP Comments:  ITP Comments     Row Name 07/01/21 0921 07/12/21 1106         ITP Comments Virtual Visit completed. Patient informed on EP and RD appointment and 6 Minute walk test. Patient also informed of patient health questionnaires on My Chart. Patient Verbalizes understanding. Visit diagnosis can be found in [facility] 06/03/2021. Completed 6MWT and gym orientation. Initial ITP created and sent for review to Dr. Emily Filbert, Medical Director.               Comments: Initial ITP

## 2021-07-13 ENCOUNTER — Other Ambulatory Visit: Payer: Self-pay

## 2021-07-13 DIAGNOSIS — Z9861 Coronary angioplasty status: Secondary | ICD-10-CM | POA: Diagnosis not present

## 2021-07-13 DIAGNOSIS — Z955 Presence of coronary angioplasty implant and graft: Secondary | ICD-10-CM

## 2021-07-13 NOTE — Progress Notes (Signed)
Daily Session Note  Patient Details  Name: Todd Rojas MRN: [id number] Date of Birth: 02/10/1941 Referring Provider:   Flowsheet Row Cardiac Rehab from 07/12/2021 in [facility] Cardiac and Pul[facility] Rehab  Referring Provider Dionne Ano MD       Encounter Date: 07/13/2021  Check In:  Session Check In - 07/13/21 0754       Check-In   Supervising physician immediately available to respond to emergencies See telemetry face sheet for immediately available ER MD    Location [facility]-Cardiac & Pulmonary Rehab    Staff Present Birdie Sons, MPA, RN;Jessica Luan Pulling, MA, RCEP, CCRP, CCET;Amanda Sommer, BA, ACSM CEP, Exercise Physiologist    Virtual Visit No    Medication changes reported     No    Fall or balance concerns reported    No    Warm-up and Cool-down Performed on first and last piece of equipment    Resistance Training Performed Yes    VAD Patient? No    PAD/SET Patient? No      Pain Assessment   Currently in Pain? No/denies                Social History   Tobacco Use  Smoking Status Former  Smokeless Tobacco Former   Types: Chew   Quit date: 08/07/1994    Goals Met:  Independence with exercise equipment Exercise tolerated well No report of cardiac concerns or symptoms Strength training completed today  Goals Unmet:  Not Applicable  Comments: First full day of exercise!  Patient was oriented to gym and equipment including functions, settings, policies, and procedures.  Patient's individual exercise prescription and treatment plan were reviewed.  All starting workloads were established based on the results of the 6 minute walk test done at initial orientation visit.  The plan for exercise progression was also introduced and progression will be customized based on patient's performance and goals.    Dr. Emily Filbert is Medical Director for [facility].  Dr. Ottie Glazier is Medical Director for [facility] Pul[facility]  [facility].

## 2021-07-15 ENCOUNTER — Other Ambulatory Visit: Payer: Self-pay

## 2021-07-15 DIAGNOSIS — Z955 Presence of coronary angioplasty implant and graft: Secondary | ICD-10-CM

## 2021-07-15 DIAGNOSIS — Z9861 Coronary angioplasty status: Secondary | ICD-10-CM | POA: Diagnosis not present

## 2021-07-15 NOTE — Progress Notes (Signed)
Daily Session Note  Patient Details  Name: Todd Rojas MRN: [id number] Date of Birth: 08/13/1941 Referring Provider:   Flowsheet Row Cardiac Rehab from 07/12/2021 in [facility] Cardiac and Pulmonary Rehab  Referring Provider Patel, Manesh MD       Encounter Date: 07/15/2021  Check In:  Session Check In - 07/15/21 0744       Check-In   Supervising physician immediately available to respond to emergencies See telemetry face sheet for immediately available ER MD    Location [facility]-Cardiac & Pul[facility] Re[facility]    Staff Present Kelly Bollinger, MPA, RN;Amanda Sommer, BA, ACSM CEP, Exercise Physiologist;Kelly Hayes, BS, ACSM CEP, Exercise Physiologist;Zylan Hood, RCP,RRT,BSRT    Virtual Visit No    Medication changes reported     No    Fall or balance concerns reported    No    Warm-up and Cool-down Performed on first and last piece of equipment    Resistance Training Performed Yes    VAD Patient? No    PAD/SET Patient? No      Pain Assessment   Currently in Pain? No/denies                Social History   Tobacco Use  Smoking Status Former  Smokeless Tobacco Former   Types: Chew   Quit date: 08/07/1994    Goals Met:  Independence with exercise equipment Exercise tolerated well No report of cardiac concerns or symptoms Strength training completed today  Goals Unmet:  Not Applicable  Comments: Pt able to follow exercise prescription today without complaint.  Will continue to monitor for progression.    Dr. Mark Miller is Medical Director for [facility].  Dr. Fuad Aleskerov is Medical Director for [facility]. 

## 2021-07-20 ENCOUNTER — Other Ambulatory Visit: Payer: Self-pay

## 2021-07-20 ENCOUNTER — Encounter: Payer: Medicare Other | Attending: Internal Medicine | Admitting: *Deleted

## 2021-07-20 DIAGNOSIS — Z87891 Personal history of nicotine dependence: Secondary | ICD-10-CM | POA: Insufficient documentation

## 2021-07-20 DIAGNOSIS — Z955 Presence of coronary angioplasty implant and graft: Secondary | ICD-10-CM | POA: Diagnosis present

## 2021-07-20 NOTE — Progress Notes (Signed)
Daily Session Note  Patient Details  Name: Todd Rojas MRN: [id number] Date of Birth: 12-Aug-1941 Referring Provider:   Flowsheet Row Cardiac Rehab from 07/12/2021 in [facility] Cardiac and Pulmonary Rehab  Referring Provider Dionne Ano MD       Encounter Date: 07/20/2021  Check In:  Session Check In - 07/20/21 0820       Check-In   Supervising physician immediately available to respond to emergencies See telemetry face sheet for immediately available ER MD    Location [facility]-Cardiac & Pulmonary Rehab    Staff Present Heath Lark, RN, BSN, CCRP;Kelly Bollinger, MPA, RN;Amanda Sommer, BA, ACSM CEP, Exercise Physiologist    Virtual Visit No    Medication changes reported     No    Fall or balance concerns reported    No    Warm-up and Cool-down Performed on first and last piece of equipment    Resistance Training Performed Yes    VAD Patient? No    PAD/SET Patient? No      Pain Assessment   Currently in Pain? No/denies                Social History   Tobacco Use  Smoking Status Former  Smokeless Tobacco Former   Types: Chew   Quit date: 08/07/1994    Goals Met:  Independence with exercise equipment Exercise tolerated well No report of cardiac concerns or symptoms  Goals Unmet:  Not Applicable  Comments: Pt able to follow exercise prescription today without complaint.  Will continue to monitor for progression.    Dr. Emily Filbert is Medical Director for [place].  Dr. Ottie Glazier is Medical Director for [facility].

## 2021-07-22 ENCOUNTER — Other Ambulatory Visit: Payer: Self-pay

## 2021-07-22 ENCOUNTER — Encounter: Payer: Medicare Other | Admitting: *Deleted

## 2021-07-22 DIAGNOSIS — Z955 Presence of coronary angioplasty implant and graft: Secondary | ICD-10-CM

## 2021-07-22 NOTE — Progress Notes (Signed)
Daily Session Note  Patient Details  Name: Todd Rojas MRN: [id number] Date of Birth: Mar 25, 1941 Referring Provider:   Flowsheet Row Cardiac Rehab from 07/12/2021 in [facility] Cardiac and Pulmonary Rehab  Referring Provider Dionne Ano MD       Encounter Date: 07/22/2021  Check In:  Session Check In - 07/22/21 0848       Check-In   Supervising physician immediately available to respond to emergencies See telemetry face sheet for immediately available ER MD    Location [facility]-Cardiac & Pulmonary Rehab    Staff Present Heath Lark, RN, BSN, Lance Sell, BA, ACSM CEP, Exercise Physiologist;Kelly Amedeo Plenty, BS, ACSM CEP, Exercise Physiologist    Virtual Visit No    Medication changes reported     No    Fall or balance concerns reported    No    Warm-up and Cool-down Performed on first and last piece of equipment    Resistance Training Performed Yes    VAD Patient? No    PAD/SET Patient? No      Pain Assessment   Currently in Pain? No/denies                Social History   Tobacco Use  Smoking Status Former  Smokeless Tobacco Former   Types: Chew   Quit date: 08/07/1994    Goals Met:  Independence with exercise equipment Exercise tolerated well No report of cardiac concerns or symptoms  Goals Unmet:  Not Applicable  Comments: Pt able to follow exercise prescription today without complaint.  Will continue to monitor for progression.    Dr. Emily Filbert is Medical Director for [place].  Dr. Ottie Glazier is Medical Director for [facility].

## 2021-07-28 ENCOUNTER — Encounter: Payer: Self-pay | Admitting: *Deleted

## 2021-07-28 DIAGNOSIS — Z955 Presence of coronary angioplasty implant and graft: Secondary | ICD-10-CM

## 2021-07-28 NOTE — Progress Notes (Signed)
Cardiac Individual Treatment Plan  Patient Details  Name: Chick Cousins MRN: [id number] Date of Birth: 06/07/1941 Referring Provider:   Flowsheet Row Cardiac Rehab from 07/12/2021 in [facility] Cardiac and Pulmonary Rehab  Referring Provider Dionne Ano MD       Initial Encounter Date:  Flowsheet Row Cardiac Rehab from 07/12/2021 in [facility] Cardiac and Pulmonary Rehab  Date 07/12/21       Visit Diagnosis: Status post coronary artery stent placement  Patient's Home Medications on Admission:  Current Outpatient Medications:    acetaminophen (TYLENOL) 500 MG tablet, Take 1,000 mg by mouth every 8 (eight) hours as needed for mild pain., Disp: , Rfl:    albuterol (PROVENTIL HFA;VENTOLIN HFA) 108 (90 Base) MCG/ACT inhaler, Inhale 1 puff into the lungs every 6 (six) hours as needed for wheezing or shortness of breath., Disp: , Rfl:    allopurinol (ZYLOPRIM) 300 MG tablet, Take 300 mg by mouth daily. , Disp: , Rfl:    amoxicillin (AMOXIL) 500 MG capsule, Take by mouth., Disp: , Rfl:    aspirin EC 81 MG tablet, Take 81 mg by mouth daily., Disp: , Rfl:    budesonide-formoterol (SYMBICORT) 160-4.5 MCG/ACT inhaler, Inhale into the lungs., Disp: , Rfl:    clopidogrel (PLAVIX) 75 MG tablet, Take by mouth., Disp: , Rfl:    colchicine 0.6 MG tablet, Take 0.6 mg by mouth daily. (Patient not taking: Reported on 07/01/2021), Disp: , Rfl:    cyanocobalamin (,VITAMIN B-12,) 1000 MCG/ML injection, Inject 1,000 mcg into the muscle every 30 (thirty) days. , Disp: , Rfl:    furosemide (LASIX) 20 MG tablet, Take 20 mg by mouth daily. (Patient not taking: Reported on 07/01/2021), Disp: , Rfl:    lisinopril (PRINIVIL,ZESTRIL) 40 MG tablet, Take 40 mg by mouth daily. (Patient not taking: Reported on 07/01/2021), Disp: , Rfl:    lisinopril (ZESTRIL) 20 MG tablet, Take 20 mg by mouth daily., Disp: , Rfl:    mupirocin ointment (BACTROBAN) 2 %, Apply 1 application topically daily. With dressing changes (Patient not  taking: No sig reported), Disp: 22 g, Rfl: 0   nitroGLYCERIN (NITROSTAT) 0.4 MG SL tablet, Place under the tongue., Disp: , Rfl:    pravastatin (PRAVACHOL) 80 MG tablet, Take 80 mg by mouth every evening. , Disp: , Rfl:    RIVAROXABAN PO, STUDY DRUG - 2.57m twice daily (Patient not taking: Reported on 07/01/2021), Disp: , Rfl:    UNABLE TO FIND, Take 100 mg by mouth daily. Study drug: Aspirin 100 mg  Dr PPosey Pronto@@ [facility] is monitoring this study (Patient not taking: Reported on 07/01/2021), Disp: , Rfl:    VANISHPOINT TUBERCULIN SYRINGE 25G X 1" 1 ML MISC, U UTD (Patient not taking: Reported on 07/01/2021), Disp: , Rfl: 5   vitamin B-12 (CYANOCOBALAMIN) 1000 MCG tablet, Take 1,000 mcg by mouth daily. (Patient not taking: Reported on 07/01/2021), Disp: , Rfl:   Past Medical History: Past Medical History:  Diagnosis Date   Actinic keratosis    Arthritis    Asthma    Basal cell carcinoma 03/17/2020   right ant lat deltoid   CAD (coronary artery disease) 02/13/2012   Overview:   12/2004 - Anginal equivalent symptoms:  Left arm pain, weakness, and shortness of breath.   12/2004:  [facility] STEMI.  [facility]   01/06/2005 - Cardiac cath:  EF 51%, an occluded mid LAD and a large OM2 with 75% ostial stenosis.  PCI of the mid occluded LAD using a Cypher  stent.   03/2005 - 2D echo:  EF >55%, LAE 4.7 cm, no significant valvular disease.   02/2008:  Plavix stopped.   06/2008:  Recurrent angina - symptoms of chest tightness.  Presented to [facility].  Cardiac catheterization.  The patient stated nonobstructive CAD seen.  Plavix restarted at that time.   03/2009 - 2D echo:  EF 35%, akinetic apex and septum.  Stress echocardiogram deferred for cardiac catheterization.   03/30/2009:  PCI 90% proximal LAD with a 3.0 x 18 mm Xience DES.   02/2010: Slow and gradual return of chest tightness, fatigue and generalized weakness concerning for return of anginal equivalent.  Stress test did not show  ischemia to the heart muscle during exercise.  Evidenc   CHF (congestive heart failure) (HCC)    Chronic kidney disease    kidney stone left side   COPD (chronic obstructive pulmonary disease) ([place]) 07/03/2017   Dyslipidemia 02/13/2012   Overview:   03/2009  TC 158, TG 88, HDL 39, LDL 102.  11/2013  TC 148, TG 115, HDL 44.3, LDL 80.7 (done at PCP's office)   10/2014 TC 146  11/2015 TC 123, TG 86, HDL 41.7, LDL 64  11/2016 TC 140, TG 129, HDL 40.1, LDL 74    Dyspnea    GERD (gastroesophageal reflux disease)    GI disease 03/09/2013   Overview:   Dysphagia - EGD demonstrated esophageal stricture which was dilated   GERD   Hemorrhoids    Hearing loss 03/09/2013   History of kidney stones    Hyperlipidemia, unspecified 07/03/2017   Hypertension 02/13/2012   Myocardial infarction [facility]) 2004   Personal history of gout 12/09/2016   Primary osteoarthritis of both knees 05/05/2015   Right-sided low back pain without sciatica 04/14/2016   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   R ear - ED&C    Tobacco Use: Social History   Tobacco Use  Smoking Status Former  Smokeless Tobacco Former   Types: Chew   Quit date: 08/07/1994    Labs: Recent Review Flowsheet Data   There is no flowsheet data to display.      Exercise Target Goals: Exercise Program Goal: Individual exercise prescription set using results from initial 6 min walk test and THRR while considering  patient's activity barriers and safety.   Exercise Prescription Goal: Initial exercise prescription builds to 30-45 minutes a day of aerobic activity, 2-3 days per week.  Home exercise guidelines will be given to patient during program as part of exercise prescription that the participant will acknowledge.   Education: Aerobic Exercise: - Group verbal and visual presentation on the components of exercise prescription. Introduces F.I.T.T principle from ACSM for exercise prescriptions.  Reviews F.I.T.T. principles of aerobic  exercise including progression. Written material given at graduation. Flowsheet Row Cardiac Rehab from 07/13/2021 in [facility] Cardiac and Pulmonary Rehab  Education need identified 07/12/21       Education: Resistance Exercise: - Group verbal and visual presentation on the components of exercise prescription. Introduces F.I.T.T principle from ACSM for exercise prescriptions  Reviews F.I.T.T. principles of resistance exercise including progression. Written material given at graduation. Flowsheet Row Cardiac Rehab from 07/13/2021 in [facility] Cardiac and Pulmonary Rehab  Education need identified 07/12/21        Education: Exercise & Equipment Safety: - Individual verbal instruction and demonstration of equipment use and safety with use of the equipment. Flowsheet Row Cardiac Rehab from 07/13/2021 in [facility] Cardiac and Pulmonary Rehab  Date 07/01/21  Educator [facility]  Instruction Review Code 1- Verbalizes Understanding       Education: Exercise Physiology & General Exercise Guidelines: - Group verbal and written instruction with models to review the exercise physiology of the cardiovascular system and associated critical values. Provides general exercise guidelines with specific guidelines to those with heart or lung disease.  Flowsheet Row Cardiac Rehab from 07/13/2021 in [facility] Cardiac and Pulmonary Rehab  Education need identified 07/12/21       Education: Flexibility, Balance, Mind/Body Relaxation: - Group verbal and visual presentation with interactive activity on the components of exercise prescription. Introduces F.I.T.T principle from ACSM for exercise prescriptions. Reviews F.I.T.T. principles of flexibility and balance exercise training including progression. Also discusses the mind body connection.  Reviews various relaxation techniques to help reduce and manage stress (i.e. Deep breathing, progressive muscle relaxation, and visualization). Balance handout provided to take home. Written material  given at graduation.   Activity Barriers & Risk Stratification:  Activity Barriers & Cardiac Risk Stratification - 07/12/21 1112       Activity Barriers & Cardiac Risk Stratification   Activity Barriers Left Knee Replacement;Right Knee Replacement;Deconditioning    Cardiac Risk Stratification High             6 Minute Walk:  6 Minute Walk     Row Name 07/12/21 1108         6 Minute Walk   Phase Initial     Distance 1480 feet     Walk Time 6 minutes     # of Rest Breaks 0     MPH 2.8     METS 2.71     RPE 9     VO2 Peak 9.5     Symptoms No     Resting HR 53 bpm     Resting BP 130/66     Resting Oxygen Saturation  95 %     Exercise Oxygen Saturation  during 6 min walk 94 %     Max Ex. HR 113 bpm     Max Ex. BP 146/64     2 Minute Post BP 122/64              Oxygen Initial Assessment:   Oxygen Re-Evaluation:   Oxygen Discharge (Final Oxygen Re-Evaluation):   Initial Exercise Prescription:  Initial Exercise Prescription - 07/12/21 1100       Date of Initial Exercise RX and Referring Provider   Date 07/12/21    Referring Provider Dionne Ano MD      Treadmill   MPH 2.2    Grade 1    Minutes 15    METs 2.99      Recumbant Bike   Level 2    RPM 50    Watts 21    Minutes 15    METs 2.5      REL-XR   Level 1    Speed 50    Minutes 15    METs 2.5      T5 Nustep   Level 2    SPM 80    Minutes 15    METs 2.5      Prescription Details   Frequency (times per week) 2    Duration Progress to 30 minutes of continuous aerobic without signs/symptoms of physical distress      Intensity   THRR 40-80% of Max Heartrate 88-123    Ratings of Perceived Exertion 11-13    Perceived Dyspnea 0-4      Progression  Progression Continue to progress workloads to maintain intensity without signs/symptoms of physical distress.      Resistance Training   Training Prescription Yes    Weight 4 lb    Reps 10-15             Perform Capillary  Blood Glucose checks as needed.  Exercise Prescription Changes:   Exercise Prescription Changes     Row Name 07/12/21 1100 07/27/21 1200           Response to Exercise   Blood Pressure (Admit) 130/66 122/60      Blood Pressure (Exercise) 146/64 136/58      Blood Pressure (Exit) 122/64 116/60      Heart Rate (Admit) 53 bpm 72 bpm      Heart Rate (Exercise) 113 bpm 90 bpm      Heart Rate (Exit) 58 bpm 84 bpm      Oxygen Saturation (Admit) 95 % --      Oxygen Saturation (Exercise) 94 % --      Rating of Perceived Exertion (Exercise) 9 14      Symptoms none none      Comments walk test results --      Duration -- Progress to 30 minutes of  aerobic without signs/symptoms of physical distress      Intensity -- THRR unchanged             Progression      Progression -- Continue to progress workloads to maintain intensity without signs/symptoms of physical distress.      Average METs -- 3.35             Resistance Training      Training Prescription -- Yes      Weight -- 4 lb      Reps -- 10-15             Interval Training      Interval Training -- No             Treadmill      MPH -- 2.6      Grade -- 1      Minutes -- 15      METs -- 3.35             NuStep      Level -- 4      Minutes -- 15              Exercise Comments:   Exercise Comments     Row Name 07/13/21 0755           Exercise Comments First full day of exercise!  Patient was oriented to gym and equipment including functions, settings, policies, and procedures.  Patient's individual exercise prescription and treatment plan were reviewed.  All starting workloads were established based on the results of the 6 minute walk test done at initial orientation visit.  The plan for exercise progression was also introduced and progression will be customized based on patient's performance and goals.                Exercise Goals and Review:   Exercise Goals     Row Name 07/12/21 1114              Exercise Goals   Increase Physical Activity Yes       Intervention Provide advice, education, support and counseling about physical activity/exercise needs.;Develop an individualized exercise prescription for aerobic and resistive training based on initial evaluation  findings, risk stratification, comorbidities and participant's personal goals.       Expected Outcomes Short Term: Attend rehab on a regular basis to increase amount of physical activity.;Long Term: Add in home exercise to make exercise part of routine and to increase amount of physical activity.;Long Term: Exercising regularly at least 3-5 days a week.       Increase Strength and Stamina Yes       Intervention Provide advice, education, support and counseling about physical activity/exercise needs.;Develop an individualized exercise prescription for aerobic and resistive training based on initial evaluation findings, risk stratification, comorbidities and participant's personal goals.       Expected Outcomes Short Term: Increase workloads from initial exercise prescription for resistance, speed, and METs.;Short Term: Perform resistance training exercises routinely during rehab and add in resistance training at home;Long Term: Improve cardiorespiratory fitness, muscular endurance and strength as measured by increased METs and functional capacity (6MWT)       Able to understand and use rate of perceived exertion (RPE) scale Yes       Intervention Provide education and explanation on how to use RPE scale       Expected Outcomes Short Term: Able to use RPE daily in rehab to express subjective intensity level;Long Term:  Able to use RPE to guide intensity level when exercising independently       Able to understand and use Dyspnea scale Yes       Intervention Provide education and explanation on how to use Dyspnea scale       Expected Outcomes Short Term: Able to use Dyspnea scale daily in rehab to express subjective sense of shortness  of breath during exertion;Long Term: Able to use Dyspnea scale to guide intensity level when exercising independently       Knowledge and understanding of Target Heart Rate Range (THRR) Yes       Intervention Provide education and explanation of THRR including how the numbers were predicted and where they are located for reference       Expected Outcomes Short Term: Able to state/look up THRR;Short Term: Able to use daily as guideline for intensity in rehab;Long Term: Able to use THRR to govern intensity when exercising independently       Able to check pulse independently Yes       Intervention Provide education and demonstration on how to check pulse in carotid and radial arteries.;Review the importance of being able to check your own pulse for safety during independent exercise       Expected Outcomes Short Term: Able to explain why pulse checking is important during independent exercise;Long Term: Able to check pulse independently and accurately       Understanding of Exercise Prescription Yes       Intervention Provide education, explanation, and written materials on patient's individual exercise prescription       Expected Outcomes Short Term: Able to explain program exercise prescription;Long Term: Able to explain home exercise prescription to exercise independently                Exercise Goals Re-Evaluation :  Exercise Goals Re-Evaluation     Row Name 07/13/21 0755 07/27/21 1258           Exercise Goal Re-Evaluation   Exercise Goals Review Able to understand and use rate of perceived exertion (RPE) scale;Increase Physical Activity;Knowledge and understanding of Target Heart Rate Range (THRR);Understanding of Exercise Prescription;Increase Strength and Stamina;Able to understand and use Dyspnea scale;Able to check pulse  independently Increase Physical Activity;Increase Strength and Stamina      Comments Reviewed RPE and dyspnea scales, THR and program prescription with pt today.   Pt voiced understanding and was given a copy of goals to take home. Domingo Cocking is doing well in his first sessions.  He reaches THR range and has increased to 2.6 mph on TM.  Staff willmonitor progress.      Expected Outcomes Short: Use RPE daily to regulate intensity. Long: Follow program prescription in THR. Short: attend consistently Long:  improve overall stamina               Discharge Exercise Prescription (Final Exercise Prescription Changes):  Exercise Prescription Changes - 07/27/21 1200       Response to Exercise   Blood Pressure (Admit) 122/60    Blood Pressure (Exercise) 136/58    Blood Pressure (Exit) 116/60    Heart Rate (Admit) 72 bpm    Heart Rate (Exercise) 90 bpm    Heart Rate (Exit) 84 bpm    Rating of Perceived Exertion (Exercise) 14    Symptoms none    Duration Progress to 30 minutes of  aerobic without signs/symptoms of physical distress    Intensity THRR unchanged      Progression   Progression Continue to progress workloads to maintain intensity without signs/symptoms of physical distress.    Average METs 3.35      Resistance Training   Training Prescription Yes    Weight 4 lb    Reps 10-15      Interval Training   Interval Training No      Treadmill   MPH 2.6    Grade 1    Minutes 15    METs 3.35      NuStep   Level 4    Minutes 15             Nutrition:  Target Goals: Understanding of nutrition guidelines, daily intake of sodium '1500mg'$ , cholesterol '200mg'$ , calories 30% from fat and 7% or less from saturated fats, daily to have 5 or more servings of fruits and vegetables.  Education: All About Nutrition: -Group instruction provided by verbal, written material, interactive activities, discussions, models, and posters to present general guidelines for heart healthy nutrition including fat, fiber, MyPlate, the role of sodium in heart healthy nutrition, utilization of the nutrition label, and utilization of this knowledge for meal planning.  Follow up email sent as well. Written material given at graduation. Flowsheet Row Cardiac Rehab from 07/13/2021 in [facility] Cardiac and Pulmonary Rehab  Education need identified 07/12/21  Date 07/13/21  Educator [place]  Instruction Review Code 1- Verbalizes Understanding       Biometrics:  Pre Biometrics - 07/12/21 1114       Pre Biometrics   Height 5' 8.4" (1.737 m)    Weight 208 lb 4.8 oz (94.5 kg)    BMI (Calculated) 31.32    Single Leg Stand 12.6 seconds              Nutrition Therapy Plan and Nutrition Goals:  Nutrition Therapy & Goals - 07/12/21 1142       Intervention Plan   Intervention Prescribe, educate and counsel regarding individualized specific dietary modifications aiming towards targeted core components such as weight, hypertension, lipid management, diabetes, heart failure and other comorbidities.    Expected Outcomes Short Term Goal: Understand basic principles of dietary content, such as calories, fat, sodium, cholesterol and nutrients.;Short Term Goal: A plan has been developed with  personal nutrition goals set during dietitian appointment.;Long Term Goal: Adherence to prescribed nutrition plan.             Nutrition Assessments:  MEDIFICTS Score Key: ?70 Need to make dietary changes  40-70 Heart Healthy Diet ? 40 Therapeutic Level Cholesterol Diet  Flowsheet Row Cardiac Rehab from 07/12/2021 in [facility] Cardiac and Pulmonary Rehab  Picture Your Plate Total Score on Admission 65      Picture Your Plate Scores: [id number] Unhealthy dietary pattern with much room for improvement. 41-50 Dietary pattern unlikely to meet recommendations for good health and room for improvement. 51-60 More healthful dietary pattern, with some room for improvement.  >60 Healthy dietary pattern, although there may be some specific behaviors that could be improved.    Nutrition Goals Re-Evaluation:   Nutrition Goals Discharge (Final Nutrition Goals  Re-Evaluation):   Psychosocial: Target Goals: Acknowledge presence or absence of significant depression and/or stress, maximize coping skills, provide positive support system. Participant is able to verbalize types and ability to use techniques and skills needed for reducing stress and depression.   Education: Stress, Anxiety, and Depression - Group verbal and visual presentation to define topics covered.  Reviews how body is impacted by stress, anxiety, and depression.  Also discusses healthy ways to reduce stress and to treat/manage anxiety and depression.  Written material given at graduation. Flowsheet Row Cardiac Rehab from 07/13/2021 in [facility] Cardiac and Pulmonary Rehab  Education need identified 07/12/21       Education: Sleep Hygiene -Provides group verbal and written instruction about how sleep can affect your health.  Define sleep hygiene, discuss sleep cycles and impact of sleep habits. Review good sleep hygiene tips.    Initial Review & Psychosocial Screening:  Initial Psych Review & Screening - 07/01/21 0924       Initial Review   Current issues with None Identified      Family Dynamics   Good Support System? Yes    Comments He can look to his wife, son, daughter and preacher for support. He has a positive outlook on his mental health      Barriers   Psychosocial barriers to participate in program There are no identifiable barriers or psychosocial needs.;The patient should benefit from training in stress management and relaxation.      Screening Interventions   Interventions Encouraged to exercise;To provide support and resources with identified psychosocial needs;Provide feedback about the scores to participant    Expected Outcomes Short Term goal: Utilizing psychosocial counselor, staff and physician to assist with identification of specific Stressors or current issues interfering with healing process. Setting desired goal for each stressor or current issue  identified.;Long Term Goal: Stressors or current issues are controlled or eliminated.;Short Term goal: Identification and review with participant of any Quality of Life or Depression concerns found by scoring the questionnaire.;Long Term goal: The participant improves quality of Life and PHQ9 Scores as seen by post scores and/or verbalization of changes             Quality of Life Scores:   Quality of Life - 07/12/21 1114       Quality of Life   Select Quality of Life      Quality of Life Scores   Health/Function Pre 26.8 %    Socioeconomic Pre 30 %    Psych/Spiritual Pre 30 %    Family Pre 30 %    GLOBAL Pre 28.63 %  Scores of 19 and below usually indicate a poorer quality of life in these areas.  A difference of  2-3 points is a clinically meaningful difference.  A difference of 2-3 points in the total score of the Quality of Life Index has been associated with significant improvement in overall quality of life, self-image, physical symptoms, and general health in studies assessing change in quality of life.  PHQ-9: Recent Review Flowsheet Data     Depression screen [facility] 2/9 07/12/2021   Decreased Interest 0   Down, Depressed, Hopeless 0   PHQ - 2 Score 0   Altered sleeping 0   Tired, decreased energy 0   Change in appetite 0   Feeling bad or failure about yourself  0   Trouble concentrating 0   Moving slowly or fidgety/restless 0   Suicidal thoughts 0   PHQ-9 Score 0   Difficult doing work/chores Not difficult at all      Interpretation of Total Score  Total Score Depression Severity:  1-4 = Minimal depression, 5-9 = Mild depression, 10-14 = Moderate depression, 15-19 = Moderately severe depression, 20-27 = Severe depression   Psychosocial Evaluation and Intervention:  Psychosocial Evaluation - 07/01/21 0925       Psychosocial Evaluation & Interventions   Interventions Encouraged to exercise with the program and follow exercise  prescription;Relaxation education;Stress management education    Comments He can look to his wife, son, daughter and preacher for support. He has a positive outlook on his mental health    Expected Outcomes Short: Exercise regularly to support mental health and notify staff of any changes. Long: maintain mental health and well being through teaching of rehab or prescribed medications independently.    Continue Psychosocial Services  Follow up required by staff             Psychosocial Re-Evaluation:   Psychosocial Discharge (Final Psychosocial Re-Evaluation):   Vocational Rehabilitation: Provide vocational rehab assistance to qualifying candidates.   Vocational Rehab Evaluation & Intervention:   Education: Education Goals: Education classes will be provided on a variety of topics geared toward better understanding of heart health and risk factor modification. Participant will state understanding/return demonstration of topics presented as noted by education test scores.  Learning Barriers/Preferences:  Learning Barriers/Preferences - 07/01/21 0923       Learning Barriers/Preferences   Learning Barriers None    Learning Preferences None             General Cardiac Education Topics:  AED/CPR: - Group verbal and written instruction with the use of models to demonstrate the basic use of the AED with the basic ABC's of resuscitation.   Anatomy and Cardiac Procedures: - Group verbal and visual presentation and models provide information about basic cardiac anatomy and function. Reviews the testing methods done to diagnose heart disease and the outcomes of the test results. Describes the treatment choices: Medical Management, Angioplasty, or Coronary Bypass Surgery for treating various heart conditions including Myocardial Infarction, Angina, Valve Disease, and Cardiac Arrhythmias.  Written material given at graduation.   Medication Safety: - Group verbal and visual  instruction to review commonly prescribed medications for heart and lung disease. Reviews the medication, class of the drug, and side effects. Includes the steps to properly store meds and maintain the prescription regimen.  Written material given at graduation.   Intimacy: - Group verbal instruction through game format to discuss how heart and lung disease can affect sexual intimacy. Written material given at graduation.Marland Kitchen  Know Your Numbers and Heart Failure: - Group verbal and visual instruction to discuss disease risk factors for cardiac and pulmonary disease and treatment options.  Reviews associated critical values for Overweight/Obesity, Hypertension, Cholesterol, and Diabetes.  Discusses basics of heart failure: signs/symptoms and treatments.  Introduces Heart Failure Zone chart for action plan for heart failure.  Written material given at graduation.   Infection Prevention: - Provides verbal and written material to individual with discussion of infection control including proper hand washing and proper equipment cleaning during exercise session. Flowsheet Row Cardiac Rehab from 07/13/2021 in [facility] Cardiac and Pulmonary Rehab  Date 07/01/21  Educator [facility]  Instruction Review Code 1- Verbalizes Understanding       Falls Prevention: - Provides verbal and written material to individual with discussion of falls prevention and safety. Flowsheet Row Cardiac Rehab from 07/13/2021 in [facility] Cardiac and Pulmonary Rehab  Date 07/01/21  Educator [facility]  Instruction Review Code 1- Verbalizes Understanding       Other: -Provides group and verbal instruction on various topics (see comments)   Knowledge Questionnaire Score:  Knowledge Questionnaire Score - 07/12/21 1116       Knowledge Questionnaire Score   Pre Score 16/26 education focus: angina, MI, pulse, depression, exercise, nutrition             Core Components/Risk Factors/Patient Goals at Admission:  Personal Goals and Risk  Factors at Admission - 07/12/21 1116       Core Components/Risk Factors/Patient Goals on Admission    Weight Management Yes;Weight Loss;Obesity    Intervention Weight Management: Develop a combined nutrition and exercise program designed to reach desired caloric intake, while maintaining appropriate intake of nutrient and fiber, sodium and fats, and appropriate energy expenditure required for the weight goal.;Weight Management: Provide education and appropriate resources to help participant work on and attain dietary goals.;Weight Management/Obesity: Establish reasonable short term and long term weight goals.;Obesity: Provide education and appropriate resources to help participant work on and attain dietary goals.    Admit Weight 208 lb 3.2 oz (94.4 kg)    Goal Weight: Short Term 203 lb (92.1 kg)    Goal Weight: Long Term 198 lb (89.8 kg)    Expected Outcomes Short Term: Continue to assess and modify interventions until short term weight is achieved;Long Term: Adherence to nutrition and physical activity/exercise program aimed toward attainment of established weight goal;Weight Loss: Understanding of general recommendations for a balanced deficit meal plan, which promotes 1-2 lb weight loss per week and includes a negative energy balance of [phone] kcal/d;Understanding recommendations for meals to include 15-35% energy as protein, 25-35% energy from fat, 35-60% energy from carbohydrates, less than '200mg'$  of dietary cholesterol, 20-35 gm of total fiber daily;Understanding of distribution of calorie intake throughout the day with the consumption of 4-5 meals/snacks    Heart Failure Yes    Intervention Provide a combined exercise and nutrition program that is supplemented with education, support and counseling about heart failure. Directed toward relieving symptoms such as shortness of breath, decreased exercise tolerance, and extremity edema.    Expected Outcomes Improve functional capacity of life;Short  term: Attendance in program 2-3 days a week with increased exercise capacity. Reported lower sodium intake. Reported increased fruit and vegetable intake. Reports medication compliance.;Short term: Daily weights obtained and reported for increase. Utilizing diuretic protocols set by physician.;Long term: Adoption of self-care skills and reduction of barriers for early signs and symptoms recognition and intervention leading to self-care maintenance.    Hypertension Yes  Intervention Provide education on lifestyle modifcations including regular physical activity/exercise, weight management, moderate sodium restriction and increased consumption of fresh fruit, vegetables, and low fat dairy, alcohol moderation, and smoking cessation.;Monitor prescription use compliance.    Expected Outcomes Short Term: Continued assessment and intervention until BP is < 140/16m HG in hypertensive participants. < 130/851mHG in hypertensive participants with diabetes, heart failure or chronic kidney disease.;Long Term: Maintenance of blood pressure at goal levels.    Lipids Yes    Intervention Provide education and support for participant on nutrition & aerobic/resistive exercise along with prescribed medications to achieve LDL '70mg'$ , HDL >'40mg'$ .    Expected Outcomes Short Term: Participant states understanding of desired cholesterol values and is compliant with medications prescribed. Participant is following exercise prescription and nutrition guidelines.;Long Term: Cholesterol controlled with medications as prescribed, with individualized exercise RX and with personalized nutrition plan. Value goals: LDL < '70mg'$ , HDL > 40 mg.             Education:Diabetes - Individual verbal and written instruction to review signs/symptoms of diabetes, desired ranges of glucose level fasting, after meals and with exercise. Acknowledge that pre and post exercise glucose checks will be done for 3 sessions at entry of program.   Core  Components/Risk Factors/Patient Goals Review:    Core Components/Risk Factors/Patient Goals at Discharge (Final Review):    ITP Comments:  ITP Comments     Row Name 07/01/21 0921 07/12/21 1106 07/13/21 0755 07/28/21 0755     ITP Comments Virtual Visit completed. Patient informed on EP and RD appointment and 6 Minute walk test. Patient also informed of patient health questionnaires on My Chart. Patient Verbalizes understanding. [facility]. Completed 6MWT and gym orientation. Initial ITP created and sent for review to Dr. MaEmily FilbertMedical Director. First full day of exercise!  Patient was oriented to gym and equipment including functions, settings, policies, and procedures.  Patient's individual exercise prescription and treatment plan were reviewed.  All starting workloads were established based on the results of the 6 minute walk test done at initial orientation visit.  The plan for exercise progression was also introduced and progression will be customized based on patient's performance and goals. 30 Day review completed. Medical Director ITP review done, changes made as directed, and signed approval by Medical Director.             Comments:

## 2021-07-29 ENCOUNTER — Telehealth: Payer: Self-pay

## 2021-07-29 DIAGNOSIS — Z955 Presence of coronary angioplasty implant and graft: Secondary | ICD-10-CM

## 2021-07-29 NOTE — Telephone Encounter (Signed)
Patient called out again from rehab. States he pulled his back out- will need to follow up next week to ensure attendance is maintained going forward and if patient was seen by doctor.

## 2021-08-03 ENCOUNTER — Other Ambulatory Visit: Payer: Self-pay

## 2021-08-03 DIAGNOSIS — Z955 Presence of coronary angioplasty implant and graft: Secondary | ICD-10-CM

## 2021-08-03 NOTE — Progress Notes (Signed)
Daily Session Note  Patient Details  Name: Todd Rojas MRN: [id number] Date of Birth: 19-Aug-1941 Referring Provider:   Flowsheet Row Cardiac Rehab from 07/12/2021 in [facility] Cardiac and Pulmonary Rehab  Referring Provider Dionne Ano MD       Encounter Date: 08/03/2021  Check In:  Session Check In - 08/03/21 0754       Check-In   Supervising physician immediately available to respond to emergencies See telemetry face sheet for immediately available ER MD    Location [facility]-Cardiac & Pulmonary Rehab    Staff Present Birdie Sons, MPA, RN;Melissa [place], RDN, Rowe Pavy, BA, ACSM CEP, Exercise Physiologist    Virtual Visit No    Medication changes reported     No    Fall or balance concerns reported    No    Warm-up and Cool-down Performed on first and last piece of equipment    Resistance Training Performed Yes    VAD Patient? No    PAD/SET Patient? No      Pain Assessment   Currently in Pain? No/denies                Social History   Tobacco Use  Smoking Status Former  Smokeless Tobacco Former   Types: Chew   Quit date: 08/07/1994    Goals Met:  Independence with exercise equipment Exercise tolerated well No report of cardiac concerns or symptoms Strength training completed today  Goals Unmet:  Not Applicable  Comments: Pt able to follow exercise prescription today without complaint.  Will continue to monitor for progression.    Dr. Emily Filbert is Medical Director for [facility].  Dr. Ottie Glazier is Medical Director for [facility] Pulmonary Rehabilitation.

## 2021-08-05 ENCOUNTER — Other Ambulatory Visit: Payer: Self-pay

## 2021-08-05 DIAGNOSIS — Z955 Presence of coronary angioplasty implant and graft: Secondary | ICD-10-CM | POA: Diagnosis not present

## 2021-08-05 NOTE — Progress Notes (Signed)
Daily Session Note  Patient Details  Name: Ferdinando Freeman Range MRN: [id number] Date of Birth: 12/11/1941 Referring Provider:   Flowsheet Row Cardiac Rehab from 07/12/2021 in [facility] Cardiac and Pulmonary Rehab  Referring Provider Patel, Manesh MD       Encounter Date: 08/05/2021  Check In:  Session Check In - 08/05/21 0746       Check-In   Supervising physician immediately available to respond to emergencies See telemetry face sheet for immediately available ER MD    Location [facility]-Cardiac & Pulmonary Rehab    Staff Present Kelly Bollinger, MPA, RN;Kelly Hayes, BS, ACSM CEP, Exercise Physiologist;Gertrude Hood, RCP,RRT,BSRT    Virtual Visit No    Medication changes reported     No    Fall or balance concerns reported    No    Warm-up and Cool-down Performed on first and last piece of equipment    Resistance Training Performed Yes    VAD Patient? No    PAD/SET Patient? No      Pain Assessment   Currently in Pain? No/denies                Social History   Tobacco Use  Smoking Status Former  Smokeless Tobacco Former   Types: Chew   Quit date: 08/07/1994    Goals Met:  Independence with exercise equipment Exercise tolerated well No report of cardiac concerns or symptoms Strength training completed today  Goals Unmet:  Not Applicable  Comments: Pt able to follow exercise prescription today without complaint.  Will continue to monitor for progression.    Dr. Mark Miller is Medical Director for [facility].  Dr. Fuad Aleskerov is Medical Director for [facility] Pulmonary Rehabilitation. 

## 2021-08-10 ENCOUNTER — Other Ambulatory Visit: Payer: Self-pay

## 2021-08-10 DIAGNOSIS — Z955 Presence of coronary angioplasty implant and graft: Secondary | ICD-10-CM | POA: Diagnosis not present

## 2021-08-10 NOTE — Progress Notes (Signed)
Daily Session Note  Patient Details  Name: Unique Searfoss MRN: [id number] Date of Birth: [birth year] Referring Provider:   Flowsheet Row Cardiac Rehab from 07/12/2021 in [facility] Cardiac and Pulmonary Rehab  Referring Provider Dionne Ano MD       Encounter Date: 08/10/2021  Check In:  Session Check In - 08/10/21 0802       Check-In   Supervising physician immediately available to respond to emergencies See telemetry face sheet for immediately available ER MD    Location [facility]-Cardiac & Pulmonary Rehab    Staff Present Birdie Sons, MPA, Nino Glow, MS, ASCM CEP, Exercise Physiologist;Amanda Oletta Darter, BA, ACSM CEP, Exercise Physiologist    Virtual Visit No    Medication changes reported     No    Fall or balance concerns reported    No    Warm-up and Cool-down Performed on first and last piece of equipment    Resistance Training Performed Yes    VAD Patient? No    PAD/SET Patient? No      Pain Assessment   Currently in Pain? No/denies                Social History   Tobacco Use  Smoking Status Former  Smokeless Tobacco Former   Types: Chew   Quit date: 08/07/1994    Goals Met:  Independence with exercise equipment Exercise tolerated well No report of cardiac concerns or symptoms Strength training completed today  Goals Unmet:  Not Applicable  Comments: Pt able to follow exercise prescription today without complaint.  Will continue to monitor for progression.    Dr. Emily Filbert is Medical Director for [place].  Dr. Ottie Glazier is Medical Director for [facility]monary Rehabilitation.

## 2021-08-12 ENCOUNTER — Other Ambulatory Visit: Payer: Self-pay

## 2021-08-12 DIAGNOSIS — Z955 Presence of coronary angioplasty implant and graft: Secondary | ICD-10-CM | POA: Diagnosis not present

## 2021-08-12 NOTE — Progress Notes (Signed)
Daily Session Note  Patient Details  Name: Todd Rojas MRN: [id number] Date of Birth: Jan 11, 1941 Referring Provider:   Flowsheet Row Cardiac Rehab from 07/12/2021 in [facility] Cardiac and Pulmonary Rehab  Referring Provider Dionne Ano MD       Encounter Date: 08/12/2021  Check In:  Session Check In - 08/12/21 0752       Check-In   Supervising physician immediately available to respond to emergencies See telemetry face sheet for immediately available ER MD    Location [facility]-Cardiac & Pulmonary Rehab    Staff Present Birdie Sons, MPA, Elveria Rising, BA, ACSM CEP, Exercise Physiologist;Aris Even Amedeo Plenty, BS, ACSM CEP, Exercise Physiologist    Virtual Visit No    Medication changes reported     No    Fall or balance concerns reported    No    Warm-up and Cool-down Performed on first and last piece of equipment    Resistance Training Performed Yes    VAD Patient? No    PAD/SET Patient? No      Pain Assessment   Currently in Pain? No/denies                Social History   Tobacco Use  Smoking Status Former  Smokeless Tobacco Former   Types: Chew   Quit date: 08/07/1994    Goals Met:  Independence with exercise equipment Exercise tolerated well No report of cardiac concerns or symptoms Strength training completed today  Goals Unmet:  Not Applicable  Comments: Pt able to follow exercise prescription today without complaint.  Will continue to monitor for progression.    Dr. Emily Filbert is Medical Director for [place].  Dr. Ottie Glazier is Medical Director for [facility] Pulmonary Rehabilitation.

## 2021-08-19 ENCOUNTER — Encounter: Payer: Medicare Other | Attending: Internal Medicine

## 2021-08-19 DIAGNOSIS — Z87891 Personal history of nicotine dependence: Secondary | ICD-10-CM | POA: Insufficient documentation

## 2021-08-19 DIAGNOSIS — Z955 Presence of coronary angioplasty implant and graft: Secondary | ICD-10-CM | POA: Insufficient documentation

## 2021-08-25 ENCOUNTER — Encounter: Payer: Self-pay | Admitting: *Deleted

## 2021-08-25 DIAGNOSIS — Z955 Presence of coronary angioplasty implant and graft: Secondary | ICD-10-CM

## 2021-08-25 NOTE — Progress Notes (Signed)
Cardiac Individual Treatment Plan  Patient Details  Name: Chick Cousins MRN: [id number] Date of Birth: 06/07/1941 Referring Provider:   Flowsheet Row Cardiac Rehab from 07/12/2021 in [facility] Cardiac and Pulmonary Rehab  Referring Provider Dionne Ano MD       Initial Encounter Date:  Flowsheet Row Cardiac Rehab from 07/12/2021 in [facility] Cardiac and Pulmonary Rehab  Date 07/12/21       Visit Diagnosis: Status post coronary artery stent placement  Patient's Home Medications on Admission:  Current Outpatient Medications:    acetaminophen (TYLENOL) 500 MG tablet, Take 1,000 mg by mouth every 8 (eight) hours as needed for mild pain., Disp: , Rfl:    albuterol (PROVENTIL HFA;VENTOLIN HFA) 108 (90 Base) MCG/ACT inhaler, Inhale 1 puff into the lungs every 6 (six) hours as needed for wheezing or shortness of breath., Disp: , Rfl:    allopurinol (ZYLOPRIM) 300 MG tablet, Take 300 mg by mouth daily. , Disp: , Rfl:    amoxicillin (AMOXIL) 500 MG capsule, Take by mouth., Disp: , Rfl:    aspirin EC 81 MG tablet, Take 81 mg by mouth daily., Disp: , Rfl:    budesonide-formoterol (SYMBICORT) 160-4.5 MCG/ACT inhaler, Inhale into the lungs., Disp: , Rfl:    clopidogrel (PLAVIX) 75 MG tablet, Take by mouth., Disp: , Rfl:    colchicine 0.6 MG tablet, Take 0.6 mg by mouth daily. (Patient not taking: Reported on 07/01/2021), Disp: , Rfl:    cyanocobalamin (,VITAMIN B-12,) 1000 MCG/ML injection, Inject 1,000 mcg into the muscle every 30 (thirty) days. , Disp: , Rfl:    furosemide (LASIX) 20 MG tablet, Take 20 mg by mouth daily. (Patient not taking: Reported on 07/01/2021), Disp: , Rfl:    lisinopril (PRINIVIL,ZESTRIL) 40 MG tablet, Take 40 mg by mouth daily. (Patient not taking: Reported on 07/01/2021), Disp: , Rfl:    lisinopril (ZESTRIL) 20 MG tablet, Take 20 mg by mouth daily., Disp: , Rfl:    mupirocin ointment (BACTROBAN) 2 %, Apply 1 application topically daily. With dressing changes (Patient not  taking: No sig reported), Disp: 22 g, Rfl: 0   nitroGLYCERIN (NITROSTAT) 0.4 MG SL tablet, Place under the tongue., Disp: , Rfl:    pravastatin (PRAVACHOL) 80 MG tablet, Take 80 mg by mouth every evening. , Disp: , Rfl:    RIVAROXABAN PO, STUDY DRUG - 2.57m twice daily (Patient not taking: Reported on 07/01/2021), Disp: , Rfl:    UNABLE TO FIND, Take 100 mg by mouth daily. Study drug: Aspirin 100 mg  Dr PPosey Pronto@@ [facility] is monitoring this study (Patient not taking: Reported on 07/01/2021), Disp: , Rfl:    VANISHPOINT TUBERCULIN SYRINGE 25G X 1" 1 ML MISC, U UTD (Patient not taking: Reported on 07/01/2021), Disp: , Rfl: 5   vitamin B-12 (CYANOCOBALAMIN) 1000 MCG tablet, Take 1,000 mcg by mouth daily. (Patient not taking: Reported on 07/01/2021), Disp: , Rfl:   Past Medical History: Past Medical History:  Diagnosis Date   Actinic keratosis    Arthritis    Asthma    Basal cell carcinoma 03/17/2020   right ant lat deltoid   CAD (coronary artery disease) 02/13/2012   Overview:   12/2004 - Anginal equivalent symptoms:  Left arm pain, weakness, and shortness of breath.   12/2004:  [facility]I.  [facility]   01/06/2005 - Cardiac cath:  EF 51%, an occluded mid LAD and a large OM2 with 75% ostial stenosis.  PCI of the mid occluded LAD using a Cypher  stent.   03/2005 - 2D echo:  EF >55%, LAE 4.7 cm, no significant valvular disease.   02/2008:  Plavix stopped.   06/2008:  Recurrent angina - symptoms of chest tightness.  Presented to [facility].  Cardiac catheterization.  The patient stated nonobstructive CAD seen.  Plavix restarted at that time.   03/2009 - 2D echo:  EF 35%, akinetic apex and septum.  Stress echocardiogram deferred for cardiac catheterization.   03/30/2009:  PCI 90% proximal LAD with a 3.0 x 18 mm Xience DES.   02/2010: Slow and gradual return of chest tightness, fatigue and generalized weakness concerning for return of anginal equivalent.  Stress test did not show  ischemia to the heart muscle during exercise.  Evidenc   CHF (congestive heart failure) (HCC)    Chronic kidney disease    kidney stone left side   COPD (chronic obstructive pulmonary disease) ([place]) 07/03/2017   Dyslipidemia 02/13/2012   Overview:   03/2009  TC 158, TG 88, HDL 39, LDL 102.  11/2013  TC 148, TG 115, HDL 44.3, LDL 80.7 (done at PCP's office)   10/2014 TC 146  11/2015 TC 123, TG 86, HDL 41.7, LDL 64  11/2016 TC 140, TG 129, HDL 40.1, LDL 74    Dyspnea    GERD (gastroesophageal reflux disease)    GI disease 03/09/2013   Overview:   Dysphagia - EGD demonstrated esophageal stricture which was dilated   GERD   Hemorrhoids    Hearing loss 03/09/2013   History of kidney stones    Hyperlipidemia, unspecified 07/03/2017   Hypertension 02/13/2012   Myocardial infarction [facility]) 2004   Personal history of gout 12/09/2016   Primary osteoarthritis of both knees 05/05/2015   Right-sided low back pain without sciatica 04/14/2016   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   R ear - ED&C    Tobacco Use: Social History   Tobacco Use  Smoking Status Former  Smokeless Tobacco Former   Types: Chew   Quit date: 08/07/1994    Labs: Recent Review Flowsheet Data   There is no flowsheet data to display.      Exercise Target Goals: Exercise Program Goal: Individual exercise prescription set using results from initial 6 min walk test and THRR while considering  patient's activity barriers and safety.   Exercise Prescription Goal: Initial exercise prescription builds to 30-45 minutes a day of aerobic activity, 2-3 days per week.  Home exercise guidelines will be given to patient during program as part of exercise prescription that the participant will acknowledge.   Education: Aerobic Exercise: - Group verbal and visual presentation on the components of exercise prescription. Introduces F.I.T.T principle from ACSM for exercise prescriptions.  Reviews F.I.T.T. principles of aerobic  exercise including progression. Written material given at graduation. Flowsheet Row Cardiac Rehab from 08/05/2021 in [facility] Cardiac and Pulmonary Rehab  Education need identified 07/12/21       Education: Resistance Exercise: - Group verbal and visual presentation on the components of exercise prescription. Introduces F.I.T.T principle from ACSM for exercise prescriptions  Reviews F.I.T.T. principles of resistance exercise including progression. Written material given at graduation. Flowsheet Row Cardiac Rehab from 08/05/2021 in [facility] Cardiac and Pulmonary Rehab  Education need identified 07/12/21        Education: Exercise & Equipment Safety: - Individual verbal instruction and demonstration of equipment use and safety with use of the equipment. Flowsheet Row Cardiac Rehab from 08/05/2021 in [facility] Cardiac and Pulmonary Rehab  Date 07/01/21  Educator [employer] [facility]  Instruction Review Code 1- Verbalizes Understanding       Education: Exercise Physiology & General Exercise Guidelines: - Group verbal and written instruction with models to review the exercise physiology of the cardiovascular system and associated critical values. Provides general exercise guidelines with specific guidelines to those with heart or lung disease.  Flowsheet Row Cardiac Rehab from 08/05/2021 in [facility] Cardiac and Pulmonary Rehab  Education need identified 07/12/21  Date 08/05/21  Educator [facility]  Instruction Review Code 1- Verbalizes Understanding       Education: Flexibility, Balance, Mind/Body Relaxation: - Group verbal and visual presentation with interactive activity on the components of exercise prescription. Introduces F.I.T.T principle from ACSM for exercise prescriptions. Reviews F.I.T.T. principles of flexibility and balance exercise training including progression. Also discusses the mind body connection.  Reviews various relaxation techniques to help reduce and manage stress (i.e. Deep breathing, progressive muscle  relaxation, and visualization). Balance handout provided to take home. Written material given at graduation.   Activity Barriers & Risk Stratification:  Activity Barriers & Cardiac Risk Stratification - 07/12/21 1112       Activity Barriers & Cardiac Risk Stratification   Activity Barriers Left Knee Replacement;Right Knee Replacement;Deconditioning    Cardiac Risk Stratification High             6 Minute Walk:  6 Minute Walk     Row Name 07/12/21 1108         6 Minute Walk   Phase Initial     Distance 1480 feet     Walk Time 6 minutes     # of Rest Breaks 0     MPH 2.8     METS 2.71     RPE 9     VO2 Peak 9.5     Symptoms No     Resting HR 53 bpm     Resting BP 130/66     Resting Oxygen Saturation  95 %     Exercise Oxygen Saturation  during 6 min walk 94 %     Max Ex. HR 113 bpm     Max Ex. BP 146/64     2 Minute Post BP 122/64              Oxygen Initial Assessment:   Oxygen Re-Evaluation:   Oxygen Discharge (Final Oxygen Re-Evaluation):   Initial Exercise Prescription:  Initial Exercise Prescription - 07/12/21 1100       Date of Initial Exercise RX and Referring Provider   Date 07/12/21    Referring Provider Dionne Ano MD      Treadmill   MPH 2.2    Grade 1    Minutes 15    METs 2.99      Recumbant Bike   Level 2    RPM 50    Watts 21    Minutes 15    METs 2.5      REL-XR   Level 1    Speed 50    Minutes 15    METs 2.5      T5 Nustep   Level 2    SPM 80    Minutes 15    METs 2.5      Prescription Details   Frequency (times per week) 2    Duration Progress to 30 minutes of continuous aerobic without signs/symptoms of physical distress      Intensity   THRR 40-80% of Max Heartrate 88-123    Ratings of Perceived Exertion  11-13    Perceived Dyspnea 0-4      Progression   Progression Continue to progress workloads to maintain intensity without signs/symptoms of physical distress.      Resistance Training    Training Prescription Yes    Weight 4 lb    Reps 10-15             Perform Capillary Blood Glucose checks as needed.  Exercise Prescription Changes:   Exercise Prescription Changes     Row Name 07/12/21 1100 07/27/21 1200 08/12/21 1200         Response to Exercise   Blood Pressure (Admit) 130/66 122/60 124/72     Blood Pressure (Exercise) 146/64 136/58 --     Blood Pressure (Exit) 122/64 116/60 132/72     Heart Rate (Admit) 53 bpm 72 bpm 73 bpm     Heart Rate (Exercise) 113 bpm 90 bpm 117 bpm     Heart Rate (Exit) 58 bpm 84 bpm 75 bpm     Oxygen Saturation (Admit) 95 % -- --     Oxygen Saturation (Exercise) 94 % -- --     Rating of Perceived Exertion (Exercise) _0 Symptoms none none back pain     Comments walk test results -- --     Duration -- Progress to 30 minutes of  aerobic without signs/symptoms of physical distress Progress to 30 minutes of  aerobic without signs/symptoms of physical distress     Intensity -- THRR unchanged THRR unchanged           Progression   Progression -- Continue to progress workloads to maintain intensity without signs/symptoms of physical distress. Continue to progress workloads to maintain intensity without signs/symptoms of physical distress.     Average METs -- 3.35 2.93           Resistance Training   Training Prescription -- Yes Yes     Weight -- 4 lb 5 lb     Reps -- 10-15 10-15           Interval Training   Interval Training -- No No           Treadmill   MPH -- 2.6 2.6     Grade -- 1 1.5     Minutes -- 15 15     METs -- 3.35 3.53           NuStep   Level -- 4 --     Minutes -- 15 --           REL-XR   Level -- -- 4     Minutes -- -- 15     METs -- -- 2.6           T5 Nustep   Level -- -- 4     Minutes -- -- 15     METs -- -- 2.3              Exercise Comments:   Exercise Comments     Row Name 07/13/21 0755           Exercise Comments First full day of exercise!  Patient was  oriented to gym and equipment including functions, settings, policies, and procedures.  Patient's individual exercise prescription and treatment plan were reviewed.  All starting workloads were established based on the results of the 6 minute walk test done at initial orientation visit.  The plan for exercise progression was also introduced and  progression will be customized based on patient's performance and goals.                Exercise Goals and Review:   Exercise Goals     Row Name 07/12/21 1114             Exercise Goals   Increase Physical Activity Yes       Intervention Provide advice, education, support and counseling about physical activity/exercise needs.;Develop an individualized exercise prescription for aerobic and resistive training based on initial evaluation findings, risk stratification, comorbidities and participant's personal goals.       Expected Outcomes Short Term: Attend rehab on a regular basis to increase amount of physical activity.;Long Term: Add in home exercise to make exercise part of routine and to increase amount of physical activity.;Long Term: Exercising regularly at least 3-5 days a week.       Increase Strength and Stamina Yes       Intervention Provide advice, education, support and counseling about physical activity/exercise needs.;Develop an individualized exercise prescription for aerobic and resistive training based on initial evaluation findings, risk stratification, comorbidities and participant's personal goals.       Expected Outcomes Short Term: Increase workloads from initial exercise prescription for resistance, speed, and METs.;Short Term: Perform resistance training exercises routinely during rehab and add in resistance training at home;Long Term: Improve cardiorespiratory fitness, muscular endurance and strength as measured by increased METs and functional capacity (6MWT)       Able to understand and use rate of perceived exertion (RPE)  scale Yes       Intervention Provide education and explanation on how to use RPE scale       Expected Outcomes Short Term: Able to use RPE daily in rehab to express subjective intensity level;Long Term:  Able to use RPE to guide intensity level when exercising independently       Able to understand and use Dyspnea scale Yes       Intervention Provide education and explanation on how to use Dyspnea scale       Expected Outcomes Short Term: Able to use Dyspnea scale daily in rehab to express subjective sense of shortness of breath during exertion;Long Term: Able to use Dyspnea scale to guide intensity level when exercising independently       Knowledge and understanding of Target Heart Rate Range (THRR) Yes       Intervention Provide education and explanation of THRR including how the numbers were predicted and where they are located for reference       Expected Outcomes Short Term: Able to state/look up THRR;Short Term: Able to use daily as guideline for intensity in rehab;Long Term: Able to use THRR to govern intensity when exercising independently       Able to check pulse independently Yes       Intervention Provide education and demonstration on how to check pulse in carotid and radial arteries.;Review the importance of being able to check your own pulse for safety during independent exercise       Expected Outcomes Short Term: Able to explain why pulse checking is important during independent exercise;Long Term: Able to check pulse independently and accurately       Understanding of Exercise Prescription Yes       Intervention Provide education, explanation, and written materials on patient's individual exercise prescription       Expected Outcomes Short Term: Able to explain program exercise prescription;Long Term: Able to explain home exercise  prescription to exercise independently                Exercise Goals Re-Evaluation :  Exercise Goals Re-Evaluation     Row Name 07/13/21 0755  07/27/21 1258 08/12/21 1301         Exercise Goal Re-Evaluation   Exercise Goals Review Able to understand and use rate of perceived exertion (RPE) scale;Increase Physical Activity;Knowledge and understanding of Target Heart Rate Range (THRR);Understanding of Exercise Prescription;Increase Strength and Stamina;Able to understand and use Dyspnea scale;Able to check pulse independently Increase Physical Activity;Increase Strength and Stamina Increase Physical Activity;Increase Strength and Stamina     Comments Reviewed RPE and dyspnea scales, THR and program prescription with pt today.  Pt voiced understanding and was given a copy of goals to take home. Domingo Cocking is doing well in his first sessions.  He reaches THR range and has increased to 2.6 mph on TM.  Staff willmonitor progress. Domingo Cocking is doing well in rehab. He has increased his handweights to 6 lbs and moved up to level 4 on the XR. Will continue to monitor.     Expected Outcomes Short: Use RPE daily to regulate intensity. Long: Follow program prescription in THR. Short: attend consistently Long:  improve overall stamina Short: Continue maintaining higher workloads on the treadmill Long: Continue to increase overall MET level              Discharge Exercise Prescription (Final Exercise Prescription Changes):  Exercise Prescription Changes - 08/12/21 1200       Response to Exercise   Blood Pressure (Admit) 124/72    Blood Pressure (Exit) 132/72    Heart Rate (Admit) 73 bpm    Heart Rate (Exercise) 117 bpm    Heart Rate (Exit) 75 bpm    Rating of Perceived Exertion (Exercise) 14    Symptoms back pain    Duration Progress to 30 minutes of  aerobic without signs/symptoms of physical distress    Intensity THRR unchanged      Progression   Progression Continue to progress workloads to maintain intensity without signs/symptoms of physical distress.    Average METs 2.93      Resistance Training   Training Prescription Yes    Weight 5  lb    Reps 10-15      Interval Training   Interval Training No      Treadmill   MPH 2.6    Grade 1.5    Minutes 15    METs 3.53      REL-XR   Level 4    Minutes 15    METs 2.6      T5 Nustep   Level 4    Minutes 15    METs 2.3             Nutrition:  Target Goals: Understanding of nutrition guidelines, daily intake of sodium <1586m, cholesterol <2071m calories 30% from fat and 7% or less from saturated fats, daily to have 5 or more servings of fruits and vegetables.  Education: All About Nutrition: -Group instruction provided by verbal, written material, interactive activities, discussions, models, and posters to present general guidelines for heart healthy nutrition including fat, fiber, MyPlate, the role of sodium in heart healthy nutrition, utilization of the nutrition label, and utilization of this knowledge for meal planning. Follow up email sent as well. Written material given at graduation. [facility]  Education need identified 07/12/21  Date 07/13/21  Educator [place]  Instruction Review Code 1- Verbalizes Understanding       Biometrics:  Pre Biometrics - 07/12/21 1114       Pre Biometrics   Height 5' 8.4" (1.737 m)    Weight 208 lb 4.8 oz (94.5 kg)    BMI (Calculated) 31.32    Single Leg Stand 12.6 seconds              Nutrition Therapy Plan and Nutrition Goals:  Nutrition Therapy & Goals - 07/12/21 1142       Intervention Plan   Intervention Prescribe, educate and counsel regarding individualized specific dietary modifications aiming towards targeted core components such as weight, hypertension, lipid management, diabetes, heart failure and other comorbidities.    Expected Outcomes Short Term Goal: Understand basic principles of dietary content, such as calories, fat, sodium, cholesterol and nutrients.;Short Term Goal: A plan has been developed with personal nutrition goals set  during dietitian appointment.;Long Term Goal: Adherence to prescribed nutrition plan.             Nutrition Assessments:  MEDIFICTS Score Key: ?70 Need to make dietary changes  40-70 Heart Healthy Diet ? 40 Therapeutic Level Cholesterol Diet  Flowsheet Row Cardiac Rehab from 07/12/2021 in [facility] Cardiac and Pulmonary Rehab  Picture Your Plate Total Score on Admission 65      Picture Your Plate Scores: <16 Unhealthy dietary pattern with much room for improvement. 41-50 Dietary pattern unlikely to meet recommendations for good health and room for improvement. 51-60 More healthful dietary pattern, with some room for improvement.  >60 Healthy dietary pattern, although there may be some specific behaviors that could be improved.    Nutrition Goals Re-Evaluation:   Nutrition Goals Discharge (Final Nutrition Goals Re-Evaluation):   Psychosocial: Target Goals: Acknowledge presence or absence of significant depression and/or stress, maximize coping skills, provide positive support system. Participant is able to verbalize types and ability to use techniques and skills needed for reducing stress and depression.   Education: Stress, Anxiety, and Depression - Group verbal and visual presentation to define topics covered.  Reviews how body is impacted by stress, anxiety, and depression.  Also discusses healthy ways to reduce stress and to treat/manage anxiety and depression.  Written material given at graduation. Flowsheet Row Cardiac Rehab from 08/05/2021 in [facility] Cardiac and Pulmonary Rehab  Education need identified 07/12/21       Education: Sleep Hygiene -Provides group verbal and written instruction about how sleep can affect your health.  Define sleep hygiene, discuss sleep cycles and impact of sleep habits. Review good sleep hygiene tips.    Initial Review & Psychosocial Screening:  Initial Psych Review & Screening - 07/01/21 0924       Initial Review   Current issues with  None Identified      Family Dynamics   Good Support System? Yes    Comments He can look to his wife, son, daughter and preacher for support. He has a positive outlook on his mental health      Barriers   Psychosocial barriers to participate in program There are no identifiable barriers or psychosocial needs.;The patient should benefit from training in stress management and relaxation.      Screening Interventions   Interventions Encouraged to exercise;To provide support and resources with identified psychosocial needs;Provide feedback about the scores to participant    Expected Outcomes Short Term goal: Utilizing psychosocial counselor, staff and physician to assist with identification of specific Stressors or current issues interfering with  healing process. Setting desired goal for each stressor or current issue identified.;Long Term Goal: Stressors or current issues are controlled or eliminated.;Short Term goal: Identification and review with participant of any Quality of Life or Depression concerns found by scoring the questionnaire.;Long Term goal: The participant improves quality of Life and PHQ9 Scores as seen by post scores and/or verbalization of changes             Quality of Life Scores:   Quality of Life - 07/12/21 1114       Quality of Life   Select Quality of Life      Quality of Life Scores   Health/Function Pre 26.8 %    Socioeconomic Pre 30 %    Psych/Spiritual Pre 30 %    Family Pre 30 %    GLOBAL Pre 28.63 %            Scores of 19 and below usually indicate a poorer quality of life in these areas.  A difference of  2-3 points is a clinically meaningful difference.  A difference of 2-3 points in the total score of the Quality of Life Index has been associated with significant improvement in overall quality of life, self-image, physical symptoms, and general health in studies assessing change in quality of life.  PHQ-9: Recent Review Flowsheet Data      Depression screen [facility] 2/9 07/12/2021   Decreased Interest 0   Down, Depressed, Hopeless 0   PHQ - 2 Score 0   Altered sleeping 0   Tired, decreased energy 0   Change in appetite 0   Feeling bad or failure about yourself  0   Trouble concentrating 0   Moving slowly or fidgety/restless 0   Suicidal thoughts 0   PHQ-9 Score 0   Difficult doing work/chores Not difficult at all      Interpretation of Total Score  Total Score Depression Severity:  1-4 = Minimal depression, 5-9 = Mild depression, 10-14 = Moderate depression, 15-19 = Moderately severe depression, 20-27 = Severe depression   Psychosocial Evaluation and Intervention:  Psychosocial Evaluation - 07/01/21 0925       Psychosocial Evaluation & Interventions   Interventions Encouraged to exercise with the program and follow exercise prescription;Relaxation education;Stress management education    Comments He can look to his wife, son, daughter and preacher for support. He has a positive outlook on his mental health    Expected Outcomes Short: Exercise regularly to support mental health and notify staff of any changes. Long: maintain mental health and well being through teaching of rehab or prescribed medications independently.    Continue Psychosocial Services  Follow up required by staff             Psychosocial Re-Evaluation:   Psychosocial Discharge (Final Psychosocial Re-Evaluation):   Vocational Rehabilitation: Provide vocational rehab assistance to qualifying candidates.   Vocational Rehab Evaluation & Intervention:   Education: Education Goals: Education classes will be provided on a variety of topics geared toward better understanding of heart health and risk factor modification. Participant will state understanding/return demonstration of topics presented as noted by education test scores.  Learning Barriers/Preferences:  Learning Barriers/Preferences - 07/01/21 0923       Learning Barriers/Preferences    Learning Barriers None    Learning Preferences None             General Cardiac Education Topics:  AED/CPR: - Group verbal and written instruction with the use of models to demonstrate the basic  use of the AED with the basic ABC's of resuscitation.   Anatomy and Cardiac Procedures: - Group verbal and visual presentation and models provide information about basic cardiac anatomy and function. Reviews the testing methods done to diagnose heart disease and the outcomes of the test results. Describes the treatment choices: Medical Management, Angioplasty, or Coronary Bypass Surgery for treating various heart conditions including Myocardial Infarction, Angina, Valve Disease, and Cardiac Arrhythmias.  Written material given at graduation.   Medication Safety: - Group verbal and visual instruction to review commonly prescribed medications for heart and lung disease. Reviews the medication, class of the drug, and side effects. Includes the steps to properly store meds and maintain the prescription regimen.  Written material given at graduation.   Intimacy: - Group verbal instruction through game format to discuss how heart and lung disease can affect sexual intimacy. Written material given at graduation..   Know Your Numbers and Heart Failure: - Group verbal and visual instruction to discuss disease risk factors for cardiac and pulmonary disease and treatment options.  Reviews associated critical values for Overweight/Obesity, Hypertension, Cholesterol, and Diabetes.  Discusses basics of heart failure: signs/symptoms and treatments.  Introduces Heart Failure Zone chart for action plan for heart failure.  Written material given at graduation.   Infection Prevention: - Provides verbal and written material to individual with discussion of infection control including proper hand washing and proper equipment cleaning during exercise session. Flowsheet Row Cardiac Rehab from 08/05/2021 in [facility]  Cardiac and Pulmonary Rehab  Date 07/01/21  Educator [facility]  Instruction Review Code 1- Verbalizes Understanding       Falls Prevention: - Provides verbal and written material to individual with discussion of falls prevention and safety. Flowsheet Row Cardiac Rehab from 08/05/2021 in [facility] Cardiac and Pulmonary Rehab  Date 07/01/21  Educator [facility]  Instruction Review Code 1- Verbalizes Understanding       Other: -Provides group and verbal instruction on various topics (see comments)   Knowledge Questionnaire Score:  Knowledge Questionnaire Score - 07/12/21 1116       Knowledge Questionnaire Score   Pre Score 16/26 education focus: angina, MI, pulse, depression, exercise, nutrition             Core Components/Risk Factors/Patient Goals at Admission:  Personal Goals and Risk Factors at Admission - 07/12/21 1116       Core Components/Risk Factors/Patient Goals on Admission    Weight Management Yes;Weight Loss;Obesity    Intervention Weight Management: Develop a combined nutrition and exercise program designed to reach desired caloric intake, while maintaining appropriate intake of nutrient and fiber, sodium and fats, and appropriate energy expenditure required for the weight goal.;Weight Management: Provide education and appropriate resources to help participant work on and attain dietary goals.;Weight Management/Obesity: Establish reasonable short term and long term weight goals.;Obesity: Provide education and appropriate resources to help participant work on and attain dietary goals.    Admit Weight 208 lb 3.2 oz (94.4 kg)    Goal Weight: Short Term 203 lb (92.1 kg)    Goal Weight: Long Term 198 lb (89.8 kg)    Expected Outcomes Short Term: Continue to assess and modify interventions until short term weight is achieved;Long Term: Adherence to nutrition and physical activity/exercise program aimed toward attainment of established weight goal;Weight Loss: Understanding of general  recommendations for a balanced deficit meal plan, which promotes 1-2 lb weight loss per week and includes a negative energy balance of [phone] kcal/d;Understanding recommendations for meals to include 15-35% energy  as protein, 25-35% energy from fat, 35-60% energy from carbohydrates, less than 275m of dietary cholesterol, 20-35 gm of total fiber daily;Understanding of distribution of calorie intake throughout the day with the consumption of 4-5 meals/snacks    Heart Failure Yes    Intervention Provide a combined exercise and nutrition program that is supplemented with education, support and counseling about heart failure. Directed toward relieving symptoms such as shortness of breath, decreased exercise tolerance, and extremity edema.    Expected Outcomes Improve functional capacity of life;Short term: Attendance in program 2-3 days a week with increased exercise capacity. Reported lower sodium intake. Reported increased fruit and vegetable intake. Reports medication compliance.;Short term: Daily weights obtained and reported for increase. Utilizing diuretic protocols set by physician.;Long term: Adoption of self-care skills and reduction of barriers for early signs and symptoms recognition and intervention leading to self-care maintenance.    Hypertension Yes    Intervention Provide education on lifestyle modifcations including regular physical activity/exercise, weight management, moderate sodium restriction and increased consumption of fresh fruit, vegetables, and low fat dairy, alcohol moderation, and smoking cessation.;Monitor prescription use compliance.    Expected Outcomes Short Term: Continued assessment and intervention until BP is < 140/934mHG in hypertensive participants. < 130/8081mG in hypertensive participants with diabetes, heart failure or chronic kidney disease.;Long Term: Maintenance of blood pressure at goal levels.    Lipids Yes    Intervention Provide education and support for  participant on nutrition & aerobic/resistive exercise along with prescribed medications to achieve LDL <67m65mDL >40mg15m Expected Outcomes Short Term: Participant states understanding of desired cholesterol values and is compliant with medications prescribed. Participant is following exercise prescription and nutrition guidelines.;Long Term: Cholesterol controlled with medications as prescribed, with individualized exercise RX and with personalized nutrition plan. Value goals: LDL < 67mg,36m > 40 mg.             Education:Diabetes - Individual verbal and written instruction to review signs/symptoms of diabetes, desired ranges of glucose level fasting, after meals and with exercise. Acknowledge that pre and post exercise glucose checks will be done for 3 sessions at entry of program.   Core Components/Risk Factors/Patient Goals Review:    Core Components/Risk Factors/Patient Goals at Discharge (Final Review):    ITP Comments:  ITP Comments     Row Name 07/01/21 0921 07/12/21 1106 07/13/21 0755 07/28/21 0755 07/29/21 0838   ITP Comments Virtual Visit completed. Patient informed on EP and RD appointment and 6 Minute walk test. Patient also informed of patient health questionnaires on My Chart. Patient Verbalizes understanding. Visit diagnosis can be found in [facility]2022. Completed 6MWT and gym orientation. Initial ITP created and sent for review to Dr. Mark MEmily Filbertcal Director. First full day of exercise!  Patient was oriented to gym and equipment including functions, settings, policies, and procedures.  Patient's individual exercise prescription and treatment plan were reviewed.  All starting workloads were established based on the results of the 6 minute walk test done at initial orientation visit.  The plan for exercise progression was also introduced and progression will be customized based on patient's performance and goals. 30 Day review completed. Medical Director ITP review  done, changes made as directed, and signed approval by Medical Director. Patient called out of rehab and has no-showed the last several sessions. Patient states he pulled his back out. Will need to follow up next week to follow up and ensure attendance is maintained.    Row NaWarroad09/07/22  0720           ITP Comments 30 Day review completed. Medical Director ITP review done, changes made as directed, and signed approval by Medical Director.                Comments:

## 2021-08-26 ENCOUNTER — Telehealth: Payer: Self-pay

## 2021-08-26 NOTE — Telephone Encounter (Signed)
LMOM - tried home number and got incorrect message

## 2021-09-02 ENCOUNTER — Telehealth: Payer: Self-pay

## 2021-09-02 DIAGNOSIS — Z955 Presence of coronary angioplasty implant and graft: Secondary | ICD-10-CM

## 2021-09-02 NOTE — Telephone Encounter (Signed)
Spoke with patient regarding attendance with Cardiac Rehab. Patient says he cannot commit at this time and would like to be discharged from this program. Discharging today.

## 2021-09-02 NOTE — Progress Notes (Signed)
Cardiac Individual Treatment Plan  Patient Details  Name: Todd Rojas MRN: [id number] Date of Birth: 06/07/1941 Referring Provider:   Flowsheet Row Cardiac Rehab from 07/12/2021 in [facility] Cardiac and Pulmonary Rehab  Referring Provider Dionne Ano MD       Initial Encounter Date:  Flowsheet Row Cardiac Rehab from 07/12/2021 in [facility] Cardiac and Pulmonary Rehab  Date 07/12/21       Visit Diagnosis: Status post coronary artery stent placement  Patient's Home Medications on Admission:  Current Outpatient Medications:    acetaminophen (TYLENOL) 500 MG tablet, Take 1,000 mg by mouth every 8 (eight) hours as needed for mild pain., Disp: , Rfl:    albuterol (PROVENTIL HFA;VENTOLIN HFA) 108 (90 Base) MCG/ACT inhaler, Inhale 1 puff into the lungs every 6 (six) hours as needed for wheezing or shortness of breath., Disp: , Rfl:    allopurinol (ZYLOPRIM) 300 MG tablet, Take 300 mg by mouth daily. , Disp: , Rfl:    amoxicillin (AMOXIL) 500 MG capsule, Take by mouth., Disp: , Rfl:    aspirin EC 81 MG tablet, Take 81 mg by mouth daily., Disp: , Rfl:    budesonide-formoterol (SYMBICORT) 160-4.5 MCG/ACT inhaler, Inhale into the lungs., Disp: , Rfl:    clopidogrel (PLAVIX) 75 MG tablet, Take by mouth., Disp: , Rfl:    colchicine 0.6 MG tablet, Take 0.6 mg by mouth daily. (Patient not taking: Reported on 07/01/2021), Disp: , Rfl:    cyanocobalamin (,VITAMIN B-12,) 1000 MCG/ML injection, Inject 1,000 mcg into the muscle every 30 (thirty) days. , Disp: , Rfl:    furosemide (LASIX) 20 MG tablet, Take 20 mg by mouth daily. (Patient not taking: Reported on 07/01/2021), Disp: , Rfl:    lisinopril (PRINIVIL,ZESTRIL) 40 MG tablet, Take 40 mg by mouth daily. (Patient not taking: Reported on 07/01/2021), Disp: , Rfl:    lisinopril (ZESTRIL) 20 MG tablet, Take 20 mg by mouth daily., Disp: , Rfl:    mupirocin ointment (BACTROBAN) 2 %, Apply 1 application topically daily. With dressing changes (Patient not  taking: No sig reported), Disp: 22 g, Rfl: 0   nitroGLYCERIN (NITROSTAT) 0.4 MG SL tablet, Place under the tongue., Disp: , Rfl:    pravastatin (PRAVACHOL) 80 MG tablet, Take 80 mg by mouth every evening. , Disp: , Rfl:    RIVAROXABAN PO, STUDY DRUG - 2.57m twice daily (Patient not taking: Reported on 07/01/2021), Disp: , Rfl:    UNABLE TO FIND, Take 100 mg by mouth daily. Study drug: Aspirin 100 mg  Dr PPosey Pronto@@ [facility] is monitoring this study (Patient not taking: Reported on 07/01/2021), Disp: , Rfl:    VANISHPOINT TUBERCULIN SYRINGE 25G X 1" 1 ML MISC, U UTD (Patient not taking: Reported on 07/01/2021), Disp: , Rfl: 5   vitamin B-12 (CYANOCOBALAMIN) 1000 MCG tablet, Take 1,000 mcg by mouth daily. (Patient not taking: Reported on 07/01/2021), Disp: , Rfl:   Past Medical History: Past Medical History:  Diagnosis Date   Actinic keratosis    Arthritis    Asthma    Basal cell carcinoma 03/17/2020   right ant lat deltoid   CAD (coronary artery disease) 02/13/2012   Overview:   12/2004 - Anginal equivalent symptoms:  Left arm pain, weakness, and shortness of breath.   12/2004:  [facility]I.  [facility]   01/06/2005 - Cardiac cath:  EF 51%, an occluded mid LAD and a large OM2 with 75% ostial stenosis.  PCI of the mid occluded LAD using a Cypher  stent.   03/2005 - 2D echo:  EF >55%, LAE 4.7 cm, no significant valvular disease.   02/2008:  Plavix stopped.   06/2008:  Recurrent angina - symptoms of chest tightness.  Presented to [facility].  Cardiac catheterization.  The patient stated nonobstructive CAD seen.  Plavix restarted at that time.   03/2009 - 2D echo:  EF 35%, akinetic apex and septum.  Stress echocardiogram deferred for cardiac catheterization.   03/30/2009:  PCI 90% proximal LAD with a 3.0 x 18 mm Xience DES.   02/2010: Slow and gradual return of chest tightness, fatigue and generalized weakness concerning for return of anginal equivalent.  Stress test did not show  ischemia to the heart muscle during exercise.  Evidenc   CHF (congestive heart failure) (HCC)    Chronic kidney disease    kidney stone left side   COPD (chronic obstructive pulmonary disease) ([place]) 07/03/2017   Dyslipidemia 02/13/2012   Overview:   03/2009  TC 158, TG 88, HDL 39, LDL 102.  11/2013  TC 148, TG 115, HDL 44.3, LDL 80.7 (done at PCP's office)   10/2014 TC 146  11/2015 TC 123, TG 86, HDL 41.7, LDL 64  11/2016 TC 140, TG 129, HDL 40.1, LDL 74    Dyspnea    GERD (gastroesophageal reflux disease)    GI disease 03/09/2013   Overview:   Dysphagia - EGD demonstrated esophageal stricture which was dilated   GERD   Hemorrhoids    Hearing loss 03/09/2013   History of kidney stones    Hyperlipidemia, unspecified 07/03/2017   Hypertension 02/13/2012   Myocardial infarction [facility]) 2004   Personal history of gout 12/09/2016   Primary osteoarthritis of both knees 05/05/2015   Right-sided low back pain without sciatica 04/14/2016   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   R ear - ED&C    Tobacco Use: Social History   Tobacco Use  Smoking Status Former  Smokeless Tobacco Former   Types: Chew   Quit date: 08/07/1994    Labs: Recent Review Flowsheet Data   There is no flowsheet data to display.      Exercise Target Goals: Exercise Program Goal: Individual exercise prescription set using results from initial 6 min walk test and THRR while considering  patient's activity barriers and safety.   Exercise Prescription Goal: Initial exercise prescription builds to 30-45 minutes a day of aerobic activity, 2-3 days per week.  Home exercise guidelines will be given to patient during program as part of exercise prescription that the participant will acknowledge.   Education: Aerobic Exercise: - Group verbal and visual presentation on the components of exercise prescription. Introduces F.I.T.T principle from ACSM for exercise prescriptions.  Reviews F.I.T.T. principles of aerobic  exercise including progression. Written material given at graduation. Flowsheet Row Cardiac Rehab from 08/05/2021 in [facility] Cardiac and Pulmonary Rehab  Education need identified 07/12/21       Education: Resistance Exercise: - Group verbal and visual presentation on the components of exercise prescription. Introduces F.I.T.T principle from ACSM for exercise prescriptions  Reviews F.I.T.T. principles of resistance exercise including progression. Written material given at graduation. Flowsheet Row Cardiac Rehab from 08/05/2021 in [facility] Cardiac and Pulmonary Rehab  Education need identified 07/12/21        Education: Exercise & Equipment Safety: - Individual verbal instruction and demonstration of equipment use and safety with use of the equipment. Flowsheet Row Cardiac Rehab from 08/05/2021 in [facility] Cardiac and Pulmonary Rehab  Date 07/01/21  Educator [employer] [facility]  Instruction Review Code 1- Verbalizes Understanding       Education: Exercise Physiology & General Exercise Guidelines: - Group verbal and written instruction with models to review the exercise physiology of the cardiovascular system and associated critical values. Provides general exercise guidelines with specific guidelines to those with heart or lung disease.  Flowsheet Row Cardiac Rehab from 08/05/2021 in [facility] Cardiac and Pulmonary Rehab  Education need identified 07/12/21  Date 08/05/21  Educator [facility]  Instruction Review Code 1- Verbalizes Understanding       Education: Flexibility, Balance, Mind/Body Relaxation: - Group verbal and visual presentation with interactive activity on the components of exercise prescription. Introduces F.I.T.T principle from ACSM for exercise prescriptions. Reviews F.I.T.T. principles of flexibility and balance exercise training including progression. Also discusses the mind body connection.  Reviews various relaxation techniques to help reduce and manage stress (i.e. Deep breathing, progressive muscle  relaxation, and visualization). Balance handout provided to take home. Written material given at graduation.   Activity Barriers & Risk Stratification:  Activity Barriers & Cardiac Risk Stratification - 07/12/21 1112       Activity Barriers & Cardiac Risk Stratification   Activity Barriers Left Knee Replacement;Right Knee Replacement;Deconditioning    Cardiac Risk Stratification High             6 Minute Walk:  6 Minute Walk     Row Name 07/12/21 1108         6 Minute Walk   Phase Initial     Distance 1480 feet     Walk Time 6 minutes     # of Rest Breaks 0     MPH 2.8     METS 2.71     RPE 9     VO2 Peak 9.5     Symptoms No     Resting HR 53 bpm     Resting BP 130/66     Resting Oxygen Saturation  95 %     Exercise Oxygen Saturation  during 6 min walk 94 %     Max Ex. HR 113 bpm     Max Ex. BP 146/64     2 Minute Post BP 122/64              Oxygen Initial Assessment:   Oxygen Re-Evaluation:   Oxygen Discharge (Final Oxygen Re-Evaluation):   Initial Exercise Prescription:  Initial Exercise Prescription - 07/12/21 1100       Date of Initial Exercise RX and Referring Provider   Date 07/12/21    Referring Provider Dionne Ano MD      Treadmill   MPH 2.2    Grade 1    Minutes 15    METs 2.99      Recumbant Bike   Level 2    RPM 50    Watts 21    Minutes 15    METs 2.5      REL-XR   Level 1    Speed 50    Minutes 15    METs 2.5      T5 Nustep   Level 2    SPM 80    Minutes 15    METs 2.5      Prescription Details   Frequency (times per week) 2    Duration Progress to 30 minutes of continuous aerobic without signs/symptoms of physical distress      Intensity   THRR 40-80% of Max Heartrate 88-123    Ratings of Perceived Exertion  11-13    Perceived Dyspnea 0-4      Progression   Progression Continue to progress workloads to maintain intensity without signs/symptoms of physical distress.      Resistance Training    Training Prescription Yes    Weight 4 lb    Reps 10-15             Perform Capillary Blood Glucose checks as needed.  Exercise Prescription Changes:   Exercise Prescription Changes     Row Name 07/12/21 1100 07/27/21 1200 08/12/21 1200         Response to Exercise   Blood Pressure (Admit) 130/66 122/60 124/72     Blood Pressure (Exercise) 146/64 136/58 --     Blood Pressure (Exit) 122/64 116/60 132/72     Heart Rate (Admit) 53 bpm 72 bpm 73 bpm     Heart Rate (Exercise) 113 bpm 90 bpm 117 bpm     Heart Rate (Exit) 58 bpm 84 bpm 75 bpm     Oxygen Saturation (Admit) 95 % -- --     Oxygen Saturation (Exercise) 94 % -- --     Rating of Perceived Exertion (Exercise) _0 Symptoms none none back pain     Comments walk test results -- --     Duration -- Progress to 30 minutes of  aerobic without signs/symptoms of physical distress Progress to 30 minutes of  aerobic without signs/symptoms of physical distress     Intensity -- THRR unchanged THRR unchanged           Progression   Progression -- Continue to progress workloads to maintain intensity without signs/symptoms of physical distress. Continue to progress workloads to maintain intensity without signs/symptoms of physical distress.     Average METs -- 3.35 2.93           Resistance Training   Training Prescription -- Yes Yes     Weight -- 4 lb 5 lb     Reps -- 10-15 10-15           Interval Training   Interval Training -- No No           Treadmill   MPH -- 2.6 2.6     Grade -- 1 1.5     Minutes -- 15 15     METs -- 3.35 3.53           NuStep   Level -- 4 --     Minutes -- 15 --           REL-XR   Level -- -- 4     Minutes -- -- 15     METs -- -- 2.6           T5 Nustep   Level -- -- 4     Minutes -- -- 15     METs -- -- 2.3              Exercise Comments:   Exercise Comments     Row Name 07/13/21 0755           Exercise Comments First full day of exercise!  Patient was  oriented to gym and equipment including functions, settings, policies, and procedures.  Patient's individual exercise prescription and treatment plan were reviewed.  All starting workloads were established based on the results of the 6 minute walk test done at initial orientation visit.  The plan for exercise progression was also introduced and  progression will be customized based on patient's performance and goals.                Exercise Goals and Review:   Exercise Goals     Row Name 07/12/21 1114             Exercise Goals   Increase Physical Activity Yes       Intervention Provide advice, education, support and counseling about physical activity/exercise needs.;Develop an individualized exercise prescription for aerobic and resistive training based on initial evaluation findings, risk stratification, comorbidities and participant's personal goals.       Expected Outcomes Short Term: Attend rehab on a regular basis to increase amount of physical activity.;Long Term: Add in home exercise to make exercise part of routine and to increase amount of physical activity.;Long Term: Exercising regularly at least 3-5 days a week.       Increase Strength and Stamina Yes       Intervention Provide advice, education, support and counseling about physical activity/exercise needs.;Develop an individualized exercise prescription for aerobic and resistive training based on initial evaluation findings, risk stratification, comorbidities and participant's personal goals.       Expected Outcomes Short Term: Increase workloads from initial exercise prescription for resistance, speed, and METs.;Short Term: Perform resistance training exercises routinely during rehab and add in resistance training at home;Long Term: Improve cardiorespiratory fitness, muscular endurance and strength as measured by increased METs and functional capacity (6MWT)       Able to understand and use rate of perceived exertion (RPE)  scale Yes       Intervention Provide education and explanation on how to use RPE scale       Expected Outcomes Short Term: Able to use RPE daily in rehab to express subjective intensity level;Long Term:  Able to use RPE to guide intensity level when exercising independently       Able to understand and use Dyspnea scale Yes       Intervention Provide education and explanation on how to use Dyspnea scale       Expected Outcomes Short Term: Able to use Dyspnea scale daily in rehab to express subjective sense of shortness of breath during exertion;Long Term: Able to use Dyspnea scale to guide intensity level when exercising independently       Knowledge and understanding of Target Heart Rate Range (THRR) Yes       Intervention Provide education and explanation of THRR including how the numbers were predicted and where they are located for reference       Expected Outcomes Short Term: Able to state/look up THRR;Short Term: Able to use daily as guideline for intensity in rehab;Long Term: Able to use THRR to govern intensity when exercising independently       Able to check pulse independently Yes       Intervention Provide education and demonstration on how to check pulse in carotid and radial arteries.;Review the importance of being able to check your own pulse for safety during independent exercise       Expected Outcomes Short Term: Able to explain why pulse checking is important during independent exercise;Long Term: Able to check pulse independently and accurately       Understanding of Exercise Prescription Yes       Intervention Provide education, explanation, and written materials on patient's individual exercise prescription       Expected Outcomes Short Term: Able to explain program exercise prescription;Long Term: Able to explain home exercise  prescription to exercise independently                Exercise Goals Re-Evaluation :  Exercise Goals Re-Evaluation     Row Name 07/13/21 0755  07/27/21 1258 08/12/21 1301         Exercise Goal Re-Evaluation   Exercise Goals Review Able to understand and use rate of perceived exertion (RPE) scale;Increase Physical Activity;Knowledge and understanding of Target Heart Rate Range (THRR);Understanding of Exercise Prescription;Increase Strength and Stamina;Able to understand and use Dyspnea scale;Able to check pulse independently Increase Physical Activity;Increase Strength and Stamina Increase Physical Activity;Increase Strength and Stamina     Comments Reviewed RPE and dyspnea scales, THR and program prescription with pt today.  Pt voiced understanding and was given a copy of goals to take home. Domingo Cocking is doing well in his first sessions.  He reaches THR range and has increased to 2.6 mph on TM.  Staff willmonitor progress. Domingo Cocking is doing well in rehab. He has increased his handweights to 6 lbs and moved up to level 4 on the XR. Will continue to monitor.     Expected Outcomes Short: Use RPE daily to regulate intensity. Long: Follow program prescription in THR. Short: attend consistently Long:  improve overall stamina Short: Continue maintaining higher workloads on the treadmill Long: Continue to increase overall MET level              Discharge Exercise Prescription (Final Exercise Prescription Changes):  Exercise Prescription Changes - 08/12/21 1200       Response to Exercise   Blood Pressure (Admit) 124/72    Blood Pressure (Exit) 132/72    Heart Rate (Admit) 73 bpm    Heart Rate (Exercise) 117 bpm    Heart Rate (Exit) 75 bpm    Rating of Perceived Exertion (Exercise) 14    Symptoms back pain    Duration Progress to 30 minutes of  aerobic without signs/symptoms of physical distress    Intensity THRR unchanged      Progression   Progression Continue to progress workloads to maintain intensity without signs/symptoms of physical distress.    Average METs 2.93      Resistance Training   Training Prescription Yes    Weight 5  lb    Reps 10-15      Interval Training   Interval Training No      Treadmill   MPH 2.6    Grade 1.5    Minutes 15    METs 3.53      REL-XR   Level 4    Minutes 15    METs 2.6      T5 Nustep   Level 4    Minutes 15    METs 2.3             Nutrition:  Target Goals: Understanding of nutrition guidelines, daily intake of sodium <1586m, cholesterol <2071m calories 30% from fat and 7% or less from saturated fats, daily to have 5 or more servings of fruits and vegetables.  Education: All About Nutrition: -Group instruction provided by verbal, written material, interactive activities, discussions, models, and posters to present general guidelines for heart healthy nutrition including fat, fiber, MyPlate, the role of sodium in heart healthy nutrition, utilization of the nutrition label, and utilization of this knowledge for meal planning. Follow up email sent as well. Written material given at graduation. [facility]  Education need identified 07/12/21  Date 07/13/21  Educator [place]  Instruction Review Code 1- Verbalizes Understanding       Biometrics:  Pre Biometrics - 07/12/21 1114       Pre Biometrics   Height 5' 8.4" (1.737 m)    Weight 208 lb 4.8 oz (94.5 kg)    BMI (Calculated) 31.32    Single Leg Stand 12.6 seconds              Nutrition Therapy Plan and Nutrition Goals:  Nutrition Therapy & Goals - 07/12/21 1142       Intervention Plan   Intervention Prescribe, educate and counsel regarding individualized specific dietary modifications aiming towards targeted core components such as weight, hypertension, lipid management, diabetes, heart failure and other comorbidities.    Expected Outcomes Short Term Goal: Understand basic principles of dietary content, such as calories, fat, sodium, cholesterol and nutrients.;Short Term Goal: A plan has been developed with personal nutrition goals set  during dietitian appointment.;Long Term Goal: Adherence to prescribed nutrition plan.             Nutrition Assessments:  MEDIFICTS Score Key: ?70 Need to make dietary changes  40-70 Heart Healthy Diet ? 40 Therapeutic Level Cholesterol Diet  Flowsheet Row Cardiac Rehab from 07/12/2021 in [facility] Cardiac and Pulmonary Rehab  Picture Your Plate Total Score on Admission 65      Picture Your Plate Scores: <16 Unhealthy dietary pattern with much room for improvement. 41-50 Dietary pattern unlikely to meet recommendations for good health and room for improvement. 51-60 More healthful dietary pattern, with some room for improvement.  >60 Healthy dietary pattern, although there may be some specific behaviors that could be improved.    Nutrition Goals Re-Evaluation:   Nutrition Goals Discharge (Final Nutrition Goals Re-Evaluation):   Psychosocial: Target Goals: Acknowledge presence or absence of significant depression and/or stress, maximize coping skills, provide positive support system. Participant is able to verbalize types and ability to use techniques and skills needed for reducing stress and depression.   Education: Stress, Anxiety, and Depression - Group verbal and visual presentation to define topics covered.  Reviews how body is impacted by stress, anxiety, and depression.  Also discusses healthy ways to reduce stress and to treat/manage anxiety and depression.  Written material given at graduation. Flowsheet Row Cardiac Rehab from 08/05/2021 in [facility] Cardiac and Pulmonary Rehab  Education need identified 07/12/21       Education: Sleep Hygiene -Provides group verbal and written instruction about how sleep can affect your health.  Define sleep hygiene, discuss sleep cycles and impact of sleep habits. Review good sleep hygiene tips.    Initial Review & Psychosocial Screening:  Initial Psych Review & Screening - 07/01/21 0924       Initial Review   Current issues with  None Identified      Family Dynamics   Good Support System? Yes    Comments He can look to his wife, son, daughter and preacher for support. He has a positive outlook on his mental health      Barriers   Psychosocial barriers to participate in program There are no identifiable barriers or psychosocial needs.;The patient should benefit from training in stress management and relaxation.      Screening Interventions   Interventions Encouraged to exercise;To provide support and resources with identified psychosocial needs;Provide feedback about the scores to participant    Expected Outcomes Short Term goal: Utilizing psychosocial counselor, staff and physician to assist with identification of specific Stressors or current issues interfering with  healing process. Setting desired goal for each stressor or current issue identified.;Long Term Goal: Stressors or current issues are controlled or eliminated.;Short Term goal: Identification and review with participant of any Quality of Life or Depression concerns found by scoring the questionnaire.;Long Term goal: The participant improves quality of Life and PHQ9 Scores as seen by post scores and/or verbalization of changes             Quality of Life Scores:   Quality of Life - 07/12/21 1114       Quality of Life   Select Quality of Life      Quality of Life Scores   Health/Function Pre 26.8 %    Socioeconomic Pre 30 %    Psych/Spiritual Pre 30 %    Family Pre 30 %    GLOBAL Pre 28.63 %            Scores of 19 and below usually indicate a poorer quality of life in these areas.  A difference of  2-3 points is a clinically meaningful difference.  A difference of 2-3 points in the total score of the Quality of Life Index has been associated with significant improvement in overall quality of life, self-image, physical symptoms, and general health in studies assessing change in quality of life.  PHQ-9: Recent Review Flowsheet Data      Depression screen [facility] 2/9 07/12/2021   Decreased Interest 0   Down, Depressed, Hopeless 0   PHQ - 2 Score 0   Altered sleeping 0   Tired, decreased energy 0   Change in appetite 0   Feeling bad or failure about yourself  0   Trouble concentrating 0   Moving slowly or fidgety/restless 0   Suicidal thoughts 0   PHQ-9 Score 0   Difficult doing work/chores Not difficult at all      Interpretation of Total Score  Total Score Depression Severity:  1-4 = Minimal depression, 5-9 = Mild depression, 10-14 = Moderate depression, 15-19 = Moderately severe depression, 20-27 = Severe depression   Psychosocial Evaluation and Intervention:  Psychosocial Evaluation - 07/01/21 0925       Psychosocial Evaluation & Interventions   Interventions Encouraged to exercise with the program and follow exercise prescription;Relaxation education;Stress management education    Comments He can look to his wife, son, daughter and preacher for support. He has a positive outlook on his mental health    Expected Outcomes Short: Exercise regularly to support mental health and notify staff of any changes. Long: maintain mental health and well being through teaching of rehab or prescribed medications independently.    Continue Psychosocial Services  Follow up required by staff             Psychosocial Re-Evaluation:   Psychosocial Discharge (Final Psychosocial Re-Evaluation):   Vocational Rehabilitation: Provide vocational rehab assistance to qualifying candidates.   Vocational Rehab Evaluation & Intervention:   Education: Education Goals: Education classes will be provided on a variety of topics geared toward better understanding of heart health and risk factor modification. Participant will state understanding/return demonstration of topics presented as noted by education test scores.  Learning Barriers/Preferences:  Learning Barriers/Preferences - 07/01/21 0923       Learning Barriers/Preferences    Learning Barriers None    Learning Preferences None             General Cardiac Education Topics:  AED/CPR: - Group verbal and written instruction with the use of models to demonstrate the basic  use of the AED with the basic ABC's of resuscitation.   Anatomy and Cardiac Procedures: - Group verbal and visual presentation and models provide information about basic cardiac anatomy and function. Reviews the testing methods done to diagnose heart disease and the outcomes of the test results. Describes the treatment choices: Medical Management, Angioplasty, or Coronary Bypass Surgery for treating various heart conditions including Myocardial Infarction, Angina, Valve Disease, and Cardiac Arrhythmias.  Written material given at graduation.   Medication Safety: - Group verbal and visual instruction to review commonly prescribed medications for heart and lung disease. Reviews the medication, class of the drug, and side effects. Includes the steps to properly store meds and maintain the prescription regimen.  Written material given at graduation.   Intimacy: - Group verbal instruction through game format to discuss how heart and lung disease can affect sexual intimacy. Written material given at graduation..   Know Your Numbers and Heart Failure: - Group verbal and visual instruction to discuss disease risk factors for cardiac and pulmonary disease and treatment options.  Reviews associated critical values for Overweight/Obesity, Hypertension, Cholesterol, and Diabetes.  Discusses basics of heart failure: signs/symptoms and treatments.  Introduces Heart Failure Zone chart for action plan for heart failure.  Written material given at graduation.   Infection Prevention: - Provides verbal and written material to individual with discussion of infection control including proper hand washing and proper equipment cleaning during exercise session. Flowsheet Row Cardiac Rehab from 08/05/2021 in [facility]  Cardiac and Pulmonary Rehab  Date 07/01/21  Educator [facility]  Instruction Review Code 1- Verbalizes Understanding       Falls Prevention: - Provides verbal and written material to individual with discussion of falls prevention and safety. Flowsheet Row Cardiac Rehab from 08/05/2021 in [facility] Cardiac and Pulmonary Rehab  Date 07/01/21  Educator [facility]  Instruction Review Code 1- Verbalizes Understanding       Other: -Provides group and verbal instruction on various topics (see comments)   Knowledge Questionnaire Score:  Knowledge Questionnaire Score - 07/12/21 1116       Knowledge Questionnaire Score   Pre Score 16/26 education focus: angina, MI, pulse, depression, exercise, nutrition             Core Components/Risk Factors/Patient Goals at Admission:  Personal Goals and Risk Factors at Admission - 07/12/21 1116       Core Components/Risk Factors/Patient Goals on Admission    Weight Management Yes;Weight Loss;Obesity    Intervention Weight Management: Develop a combined nutrition and exercise program designed to reach desired caloric intake, while maintaining appropriate intake of nutrient and fiber, sodium and fats, and appropriate energy expenditure required for the weight goal.;Weight Management: Provide education and appropriate resources to help participant work on and attain dietary goals.;Weight Management/Obesity: Establish reasonable short term and long term weight goals.;Obesity: Provide education and appropriate resources to help participant work on and attain dietary goals.    Admit Weight 208 lb 3.2 oz (94.4 kg)    Goal Weight: Short Term 203 lb (92.1 kg)    Goal Weight: Long Term 198 lb (89.8 kg)    Expected Outcomes Short Term: Continue to assess and modify interventions until short term weight is achieved;Long Term: Adherence to nutrition and physical activity/exercise program aimed toward attainment of established weight goal;Weight Loss: Understanding of general  recommendations for a balanced deficit meal plan, which promotes 1-2 lb weight loss per week and includes a negative energy balance of [phone] kcal/d;Understanding recommendations for meals to include 15-35% energy  as protein, 25-35% energy from fat, 35-60% energy from carbohydrates, less than 275m of dietary cholesterol, 20-35 gm of total fiber daily;Understanding of distribution of calorie intake throughout the day with the consumption of 4-5 meals/snacks    Heart Failure Yes    Intervention Provide a combined exercise and nutrition program that is supplemented with education, support and counseling about heart failure. Directed toward relieving symptoms such as shortness of breath, decreased exercise tolerance, and extremity edema.    Expected Outcomes Improve functional capacity of life;Short term: Attendance in program 2-3 days a week with increased exercise capacity. Reported lower sodium intake. Reported increased fruit and vegetable intake. Reports medication compliance.;Short term: Daily weights obtained and reported for increase. Utilizing diuretic protocols set by physician.;Long term: Adoption of self-care skills and reduction of barriers for early signs and symptoms recognition and intervention leading to self-care maintenance.    Hypertension Yes    Intervention Provide education on lifestyle modifcations including regular physical activity/exercise, weight management, moderate sodium restriction and increased consumption of fresh fruit, vegetables, and low fat dairy, alcohol moderation, and smoking cessation.;Monitor prescription use compliance.    Expected Outcomes Short Term: Continued assessment and intervention until BP is < 140/934mHG in hypertensive participants. < 130/8081mG in hypertensive participants with diabetes, heart failure or chronic kidney disease.;Long Term: Maintenance of blood pressure at goal levels.    Lipids Yes    Intervention Provide education and support for  participant on nutrition & aerobic/resistive exercise along with prescribed medications to achieve LDL <67m65mDL >40mg15m Expected Outcomes Short Term: Participant states understanding of desired cholesterol values and is compliant with medications prescribed. Participant is following exercise prescription and nutrition guidelines.;Long Term: Cholesterol controlled with medications as prescribed, with individualized exercise RX and with personalized nutrition plan. Value goals: LDL < 67mg,36m > 40 mg.             Education:Diabetes - Individual verbal and written instruction to review signs/symptoms of diabetes, desired ranges of glucose level fasting, after meals and with exercise. Acknowledge that pre and post exercise glucose checks will be done for 3 sessions at entry of program.   Core Components/Risk Factors/Patient Goals Review:    Core Components/Risk Factors/Patient Goals at Discharge (Final Review):    ITP Comments:  ITP Comments     Row Name 07/01/21 0921 07/12/21 1106 07/13/21 0755 07/28/21 0755 07/29/21 0838   ITP Comments Virtual Visit completed. Patient informed on EP and RD appointment and 6 Minute walk test. Patient also informed of patient health questionnaires on My Chart. Patient Verbalizes understanding. Visit diagnosis can be found in [facility]2022. Completed 6MWT and gym orientation. Initial ITP created and sent for review to Dr. Mark MEmily Filbertcal Director. First full day of exercise!  Patient was oriented to gym and equipment including functions, settings, policies, and procedures.  Patient's individual exercise prescription and treatment plan were reviewed.  All starting workloads were established based on the results of the 6 minute walk test done at initial orientation visit.  The plan for exercise progression was also introduced and progression will be customized based on patient's performance and goals. 30 Day review completed. Medical Director ITP review  done, changes made as directed, and signed approval by Medical Director. Patient called out of rehab and has no-showed the last several sessions. Patient states he pulled his back out. Will need to follow up next week to follow up and ensure attendance is maintained.    Row [place]09/07/22  0720 09/02/21 1630         ITP Comments 30 Day review completed. Medical Director ITP review done, changes made as directed, and signed approval by Medical Director. Spoke with patient regarding attendance with Cardiac Rehab. Patient says he cannot commit at this time and would like to be discharged from this program. Discharging today.               Comments: Discharge ITP

## 2021-09-02 NOTE — Progress Notes (Signed)
Discharge Progress Report  Patient Details  Name: Todd Rojas MRN: [id number] Date of Birth: [birth year] Referring Provider:   Flowsheet Row Cardiac Rehab from 07/12/2021 in [facility] Cardiac and Pulmonary Rehab  Referring Provider Dionne Ano MD        Number of Visits: 11  Reason for Discharge:  Early Exit:  Personal  Diagnosis:  Status post coronary artery stent placement  ADL [facility]SD:   Initial Exercise Prescription:  Initial Exercise Prescription - 07/12/21 1100       Date of Initial Exercise RX and Referring Provider   Date 07/12/21    Referring Provider Dionne Ano MD      Treadmill   MPH 2.2    Grade 1    Minutes 15    METs 2.99      Recumbant Bike   Level 2    RPM 50    Watts 21    Minutes 15    METs 2.5      REL-XR   Level 1    Speed 50    Minutes 15    METs 2.5      T5 Nustep   Level 2    SPM 80    Minutes 15    METs 2.5      Prescription Details   Frequency (times per week) 2    Duration Progress to 30 minutes of continuous aerobic without signs/symptoms of physical distress      Intensity   THRR 40-80% of Max Heartrate 88-123    Ratings of Perceived Exertion 11-13    Perceived Dyspnea 0-4      Progression   Progression Continue to progress workloads to maintain intensity without signs/symptoms of physical distress.      Resistance Training   Training Prescription Yes    Weight 4 lb    Reps 10-15             Discharge Exercise Prescription (Final Exercise Prescription Changes):  Exercise Prescription Changes - 08/12/21 1200       Response to Exercise   Blood Pressure (Admit) 124/72    Blood Pressure (Exit) 132/72    Heart Rate (Admit) 73 bpm    Heart Rate (Exercise) 117 bpm    Heart Rate (Exit) 75 bpm    Rating of Perceived Exertion (Exercise) 14    Symptoms back pain    Duration Progress to 30 minutes of  aerobic without signs/symptoms of physical distress    Intensity THRR unchanged      Progression    Progression Continue to progress workloads to maintain intensity without signs/symptoms of physical distress.    Average METs 2.93      Resistance Training   Training Prescription Yes    Weight 5 lb    Reps 10-15      Interval Training   Interval Training No      Treadmill   MPH 2.6    Grade 1.5    Minutes 15    METs 3.53      REL-XR   Level 4    Minutes 15    METs 2.6      T5 Nustep   Level 4    Minutes 15    METs 2.3             Functional Capacity:  6 Minute Walk     Row Name 07/12/21 1108         6 Minute Walk   Phase Initial  Distance 1480 feet     Walk Time 6 minutes     # of Rest Breaks 0     MPH 2.8     METS 2.71     RPE 9     VO2 Peak 9.5     Symptoms No     Resting HR 53 bpm     Resting BP 130/66     Resting Oxygen Saturation  95 %     Exercise Oxygen Saturation  during 6 min walk 94 %     Max Ex. HR 113 bpm     Max Ex. BP 146/64     2 Minute Post BP 122/64              Nutrition & Weight - Outcomes:  Pre Biometrics - 07/12/21 1114       Pre Biometrics   Height 5' 8.4" (1.737 m)    Weight 208 lb 4.8 oz (94.5 kg)    BMI (Calculated) 31.32    Single Leg Stand 12.6 seconds             Nutrition:  Nutrition Therapy & Goals - 07/12/21 1142       Intervention Plan   Intervention Prescribe, educate and counsel regarding individualized specific dietary modifications aiming towards targeted core components such as weight, hypertension, lipid management, diabetes, heart failure and other comorbidities.    Expected Outcomes Short Term Goal: Understand basic principles of dietary content, such as calories, fat, sodium, cholesterol and nutrients.;Short Term Goal: A plan has been developed with personal nutrition goals set during dietitian appointment.;Long Term Goal: Adherence to prescribed nutrition plan.             Goals reviewed with patient; copy given to patient.

## 2021-09-06 ENCOUNTER — Encounter: Payer: Self-pay | Admitting: Dermatology

## 2021-09-06 ENCOUNTER — Other Ambulatory Visit: Payer: Self-pay

## 2021-09-06 ENCOUNTER — Ambulatory Visit (INDEPENDENT_AMBULATORY_CARE_PROVIDER_SITE_OTHER): Payer: Medicare Other | Admitting: Dermatology

## 2021-09-06 DIAGNOSIS — L820 Inflamed seborrheic keratosis: Secondary | ICD-10-CM | POA: Diagnosis not present

## 2021-09-06 DIAGNOSIS — Z85828 Personal history of other malignant neoplasm of skin: Secondary | ICD-10-CM

## 2021-09-06 DIAGNOSIS — L719 Rosacea, unspecified: Secondary | ICD-10-CM

## 2021-09-06 DIAGNOSIS — L578 Other skin changes due to chronic exposure to nonionizing radiation: Secondary | ICD-10-CM | POA: Diagnosis not present

## 2021-09-06 DIAGNOSIS — Z1283 Encounter for screening for malignant neoplasm of skin: Secondary | ICD-10-CM

## 2021-09-06 DIAGNOSIS — L814 Other melanin hyperpigmentation: Secondary | ICD-10-CM

## 2021-09-06 DIAGNOSIS — L821 Other seborrheic keratosis: Secondary | ICD-10-CM

## 2021-09-06 DIAGNOSIS — D1800 Hemangioma unspecified site: Secondary | ICD-10-CM

## 2021-09-06 DIAGNOSIS — D229 Melanocytic nevi, unspecified: Secondary | ICD-10-CM

## 2021-09-06 NOTE — Progress Notes (Signed)
Follow-Up Visit   Subjective  Todd Rojas is a 80 y.o. male who presents for the following: Follow-up (Patient here today for 6 month tbse. Patient reports a spot at right arm he would like checked today. ).  Patient here for full body skin exam and skin cancer screening.  The following portions of the chart were reviewed this encounter and updated as appropriate:  Tobacco  Allergies  Meds  Problems  Med Hx  Surg Hx  Fam Hx      Objective  Well appearing patient in no apparent distress; mood and affect are within normal limits.  A full examination was performed including scalp, head, eyes, ears, nose, lips, neck, chest, axillae, abdomen, back, buttocks, bilateral upper extremities, bilateral lower extremities, hands, feet, fingers, toes, fingernails, and toenails. All findings within normal limits unless otherwise noted below.  Head - Anterior (Face) Mid face erythema with telangiectasias +/- scattered inflammatory papules.   right elbow x 1 Erythematous keratotic or waxy stuck-on papule or plaque.   Assessment & Plan  Rosacea Head - Anterior (Face)  Rosacea is a chronic progressive skin condition usually affecting the face of adults, causing redness and/or acne bumps. It is treatable but not curable. It sometimes affects the eyes (ocular rosacea) as well. It may respond to topical and/or systemic medication and can flare with stress, sun exposure, alcohol, exercise and some foods.  Daily application of broad spectrum spf 30+ sunscreen to face is recommended to reduce flares.  With occular rosacea   Patient reports he is not bothered by eyes or redness at face  Discussed treatment options of topicals; oral meds and laser available if condition becomes bothersome.  Inflamed seborrheic keratosis right elbow x 1  Destruction of lesion - right elbow x 1 Complexity: simple   Destruction method: cryotherapy   Informed consent: discussed and consent obtained    Timeout:  patient name, date of birth, surgical site, and procedure verified Lesion destroyed using liquid nitrogen: Yes   Region frozen until ice ball extended beyond lesion: Yes   Outcome: patient tolerated procedure well with no complications   Post-procedure details: wound care instructions given    Skin cancer screening  Lentigines - Scattered tan macules - Due to sun exposure - Benign-appearing, observe - Recommend daily broad spectrum sunscreen SPF 30+ to sun-exposed areas, reapply every 2 hours as needed. - Call for any changes  Seborrheic Keratoses - Stuck-on, waxy, tan-brown papules and/or plaques  - Benign-appearing - Discussed benign etiology and prognosis. - Observe - Call for any changes  Melanocytic Nevi - Tan-brown and/or pink-flesh-colored symmetric macules and papules - Benign appearing on exam today - Observation - Call clinic for new or changing moles - Recommend daily use of broad spectrum spf 30+ sunscreen to sun-exposed areas.   Hemangiomas - Red papules - Discussed benign nature - Observe - Call for any changes  Actinic Damage - Chronic condition, secondary to cumulative UV/sun exposure - diffuse scaly erythematous macules with underlying dyspigmentation - Recommend daily broad spectrum sunscreen SPF 30+ to sun-exposed areas, reapply every 2 hours as needed.  - Staying in the shade or wearing long sleeves, sun glasses (UVA+UVB protection) and wide brim hats (4-inch brim around the entire circumference of the hat) are also recommended for sun protection.  - Call for new or changing lesions.  History of Basal Cell Carcinoma of the Skin - No evidence of recurrence today right anterior lateral deltoid (2021) - Recommend regular full body skin exams -  Recommend daily broad spectrum sunscreen SPF 30+ to sun-exposed areas, reapply every 2 hours as needed.  - Call if any new or changing lesions are noted between office visits  History of Squamous Cell  Carcinoma of the Skin - No evidence of recurrence today at right ear (2021) - No lymphadenopathy - Recommend regular full body skin exams - Recommend daily broad spectrum sunscreen SPF 30+ to sun-exposed areas, reapply every 2 hours as needed.  - Call if any new or changing lesions are noted between office visits  Skin cancer screening performed today.  Return in about 6 months (around 03/06/2022) for  ak followup and bcc and scc check . IRuthell Rummage, CMA, am acting as scribe for Sarina Ser, MD. Documentation: I have reviewed the above documentation for accuracy and completeness, and I agree with the above.  Sarina Ser, MD

## 2021-09-06 NOTE — Patient Instructions (Signed)

## 2022-03-07 ENCOUNTER — Ambulatory Visit: Payer: Medicare Other | Admitting: Dermatology

## 2022-04-11 ENCOUNTER — Ambulatory Visit (INDEPENDENT_AMBULATORY_CARE_PROVIDER_SITE_OTHER): Payer: Medicare Other | Admitting: Dermatology

## 2022-04-11 ENCOUNTER — Encounter: Payer: Self-pay | Admitting: Dermatology

## 2022-04-11 DIAGNOSIS — Z85828 Personal history of other malignant neoplasm of skin: Secondary | ICD-10-CM | POA: Diagnosis not present

## 2022-04-11 DIAGNOSIS — L814 Other melanin hyperpigmentation: Secondary | ICD-10-CM

## 2022-04-11 DIAGNOSIS — L578 Other skin changes due to chronic exposure to nonionizing radiation: Secondary | ICD-10-CM

## 2022-04-11 DIAGNOSIS — D692 Other nonthrombocytopenic purpura: Secondary | ICD-10-CM | POA: Diagnosis not present

## 2022-04-11 DIAGNOSIS — L821 Other seborrheic keratosis: Secondary | ICD-10-CM

## 2022-04-11 DIAGNOSIS — L570 Actinic keratosis: Secondary | ICD-10-CM | POA: Diagnosis not present

## 2022-04-11 NOTE — Patient Instructions (Addendum)
Cryotherapy Aftercare ? ?Wash gently with soap and water everyday.   ?Apply Vaseline and Band-Aid daily until healed.  ? ?Prior to procedure, discussed risks of blister formation, small wound, skin dyspigmentation, or rare scar following cryotherapy. Recommend Vaseline ointment to treated areas while healing.  ? ?Recommend daily broad spectrum sunscreen SPF 30+ to sun-exposed areas, reapply every 2 hours as needed. Call for new or changing lesions.  ?Staying in the shade or wearing long sleeves, sun glasses (UVA+UVB protection) and wide brim hats (4-inch brim around the entire circumference of the hat) are also recommended for sun protection.  ? ? ?If You Need Anything After Your Visit ? ?If you have any questions or concerns for your doctor, please call our main line at [phone] and press option 4 to reach your doctor's medical assistant. If no one answers, please leave a voicemail as directed and we will return your call as soon as possible. Messages left after 4 pm will be answered the following business day.  ? ?You may also send [place] a message via MyChart. We typically respond to MyChart messages within 1-2 business days. ? ?For prescription refills, please ask your pharmacy to contact our office. Our fax number is [phone]. ? ?If you have an urgent issue when the clinic is closed that cannot wait until the next business day, you can page your doctor at the number below.   ? ?Please note that while we do our best to be available for urgent issues outside of office hours, we are not available 24/7.  ? ?If you have an urgent issue and are unable to reach [place], you may choose to seek medical care at your doctor's office, retail clinic, urgent care center, or emergency room. ? ?If you have a medical emergency, please immediately call 911 or go to the emergency department. ? ?Pager Numbers ? ?- Dr. Nehemiah Massed: [phone] ? ?- Dr. Laurence Ferrari: [phone] ? ?- Dr. Nicole Kindred: [phone] ? ?In the event of inclement  weather, please call our main line at [phone] for an update on the status of any delays or closures. ? ?Dermatology Medication Tips: ?Please keep the boxes that topical medications come in in order to help keep track of the instructions about where and how to use these. Pharmacies typically print the medication instructions only on the boxes and not directly on the medication tubes.  ? ?If your medication is too expensive, please contact our office at [phone] option 4 or send [place] a message through [place].  ? ?We are unable to tell what your co-pay for medications will be in advance as this is different depending on your insurance coverage. However, we may be able to find a substitute medication at lower cost or fill out paperwork to get insurance to cover a needed medication.  ? ?If a prior authorization is required to get your medication covered by your insurance company, please allow [place] 1-2 business days to complete this process. ? ?Drug prices often vary depending on where the prescription is filled and some pharmacies may offer cheaper prices. ? ?The website www.goodrx.com contains coupons for medications through different pharmacies. The prices here do not account for what the cost may be with help from insurance (it may be cheaper with your insurance), but the website can give you the price if you did not use any insurance.  ?- You can print the associated coupon and take it with your prescription to the pharmacy.  ?- You may also stop by our office during  regular business hours and pick up a GoodRx coupon card.  ?- If you need your prescription sent electronically to a different pharmacy, notify our office through [facility] or by phone at [phone] option 4. ? ? ? ? ?Si Usted Necesita Algo Despu?s de Su Visita ? ?Tambi?n puede enviarnos un mensaje a trav?s de MyChart. Por lo general respondemos a los mensajes de MyChart en el transcurso de 1 a 2 d?as h?biles. ? ?Para renovar recetas,  por favor pida a su farmacia que se ponga en contacto con nuestra oficina. Nuestro n?mero de fax es el [phone]. ? ?Si tiene un asunto urgente cuando la cl?nica est? cerrada y que no puede esperar hasta el siguiente d?a h?bil, puede llamar/localizar a su doctor(a) al n?mero que aparece a continuaci?n.  ? ?Por favor, tenga en cuenta que aunque hacemos todo lo posible para estar disponibles para asuntos urgentes fuera del horario de oficina, no estamos disponibles las 24 horas del d?a, los 7 d?as de la semana.  ? ?Si tiene un problema urgente y no puede comunicarse con nosotros, puede optar por buscar atenci?n m?dica  en el consultorio de su doctor(a), en una cl?nica privada, en un centro de atenci?n urgente o en una sala de emergencias. ? ?Si tiene Engineer, maintenance (IT) m?dica, por favor llame inmediatamente al 911 o vaya a la sala de emergencias. ? ?N?meros de b?per ? ?- Dr. Nehemiah Massed: [phone] ? ?- Dra. Moye: [phone] ? ?- Dra. Nicole Kindred: [phone] ? ?En caso de inclemencias del tiempo, por favor llame a nuestra l?nea principal al [phone] para una actualizaci?n sobre el estado de cualquier retraso o cierre. ? ?Consejos para la medicaci?n en dermatolog?a: ?Por favor, guarde las cajas en las que vienen los medicamentos de uso t?pico para ayudarle a seguir las instrucciones sobre d?nde y c?mo usarlos. Las farmacias generalmente imprimen las instrucciones del medicamento s?lo en las cajas y no directamente en los tubos del [place].  ? ?Si su medicamento es muy caro, por favor, p?ngase en contacto con Zigmund Daniel llamando al [phone] y presione la opci?n 4 o env?enos un mensaje a trav?s de MyChart.  ? ?No podemos decirle cu?l ser? su copago por los medicamentos por adelantado ya que esto es diferente dependiendo de la cobertura de su seguro. Sin embargo, es posible que podamos encontrar un medicamento sustituto a Electrical engineer un formulario para que el seguro cubra el medicamento que se  considera necesario.  ? ?Si se requiere Ardelia Mems autorizaci?n previa para que su compa??a de seguros [place] su medicamento, por favor perm?tanos de 1 a 2 d?as h?biles para completar este proceso. ? ?Los precios de los medicamentos var?an con frecuencia dependiendo del Environmental consultant de d?nde se surte la receta y alguna farmacias pueden ofrecer precios m?s baratos. ? ?El sitio web www.goodrx.com tiene cupones para medicamentos de Airline pilot. Los precios aqu? no tienen en cuenta lo que podr?a costar con la ayuda del seguro (puede ser m?s barato con su seguro), pero el sitio web puede darle el precio si no utiliz? ning?n seguro.  ?- Puede imprimir el cup?n correspondiente y llevarlo con su receta a la farmacia.  ?- Tambi?n puede pasar por nuestra oficina durante el horario de atenci?n regular y recoger una tarjeta de cupones de GoodRx.  ?- Si necesita que su receta se env?e electr?nicamente a Chiropodist, informe a nuestra oficina a trav?s de MyChart de [place] o por tel?fono llamando al [phone] y presione la opci?n 4.  ?

## 2022-04-11 NOTE — Progress Notes (Signed)
? ?Follow-Up Visit ?  ?Subjective  ?Todd Rojas is a 81 y.o. male who presents for the following: Actinic Keratosis (6 month sun exposed exam. HxAK's. HxBCC. Areas of concern today). ?The patient has spots, moles and lesions to be evaluated, some may be new or changing and the patient has concerns that these could be cancer. ? ?The following portions of the chart were reviewed this encounter and updated as appropriate:  Tobacco  Allergies  Meds  Problems  Med Hx  Surg Hx  Fam Hx   ?  ?Review of Systems: No other skin or systemic complaints except as noted in HPI or Assessment and Plan. ? ?Objective  ?Well appearing patient in no apparent distress; mood and affect are within normal limits. ? ?A focused examination was performed including face, scalp, arms, neck, hands. Relevant physical exam findings are noted in the Assessment and Plan. ? ?Right Ear, left ear,  left temple x10 (10) ?Erythematous thin papules/macules with gritty scale.  ? ? ?Assessment & Plan  ? ?Purpura - Chronic; persistent and recurrent.  Treatable, but not curable. ?- Violaceous macules and patches at forearms and hands.  ?- Benign ?- Related to trauma, age, sun damage and/or use of blood thinners, chronic use of topical and/or oral steroids ?- Observe ?- Can use OTC arnica containing moisturizer such as Dermend Bruise Formula if desired ?- Call for worsening or other concerns ? ?Actinic Damage ?- chronic, secondary to cumulative UV radiation exposure/sun exposure over time ?- diffuse scaly erythematous macules with underlying dyspigmentation ?- Recommend daily broad spectrum sunscreen SPF 30+ to sun-exposed areas, reapply every 2 hours as needed.  ?- Recommend staying in the shade or wearing long sleeves, sun glasses (UVA+UVB protection) and wide brim hats (4-inch brim around the entire circumference of the hat). ?- Call for new or changing lesions. ? 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  03/17/2020.  ?- No evidence of recurrence today ?- Recommend regular full body skin exams ?- Recommend daily broad spectrum sunscreen SPF 30+ to sun-exposed areas, reapply every 2 hours as needed.  ?- Call if any new or changing lesions are noted between office visits ? ?Lentigines ?- Scattered tan macules ?- Due to sun exposure ?- Benign-appering, observe ?- Recommend daily broad spectrum sunscreen SPF 30+ to sun-exposed areas, reapply every 2 hours as needed. ?- Call for any changes ? ?Seborrheic Keratoses ?- Stuck-on, waxy, tan-brown papules and/or plaques  ?- Benign-appearing ?- Discussed benign etiology and prognosis. ?- Observe ?- Call for any changes ? 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 ?Actinic keratoses are precancerous spots that appear secondary to cumulative UV radiation exposure/sun exposure over time. They are chronic with expected duration over 1 year. A portion of actinic keratoses will progress to squamous cell carcinoma of the skin. It is not possible to reliably predict which spots will progress to skin cancer and so treatment is recommended to prevent development of skin cancer. ? ?Recommend daily broad spectrum sunscreen SPF 30+ to sun-exposed areas, reapply every 2 hours as needed.  ?Recommend staying in the shade or wearing long sleeves, sun glasses (UVA+UVB protection) and wide brim hats (4-inch brim around the entire circumference of the hat). ?Call for new or changing lesions. ? ?Destruction of lesion - Right Ear, left ear,  left temple x10 ?Complexity: simple   ?Destruction method: cryotherapy   ?Informed consent: discussed and consent obtained   ?Timeout:  patient name,  date of birth, surgical site, and procedure verified ?Lesion destroyed using liquid nitrogen: Yes   ?Region frozen until ice ball extended beyond lesion: Yes   ?Outcome: patient tolerated procedure well with no complications   ?Post-procedure details: wound care instructions given    ? ? ?Return in about 6 months (around 10/11/2022) for TBSE, HxBCC. ? ?I, Emelia Salisbury, CMA, am acting as scribe for Sarina Ser, MD. ?Documentation: I have reviewed the above documentation for accuracy and completeness, and I agree with the above. ? ?Sarina Ser, MD ? ? ?

## 2022-04-19 ENCOUNTER — Encounter: Payer: Self-pay | Admitting: Dermatology

## 2022-05-05 ENCOUNTER — Other Ambulatory Visit: Payer: Self-pay | Admitting: Pulmonary Disease

## 2022-05-05 DIAGNOSIS — J449 Chronic obstructive pulmonary disease, unspecified: Secondary | ICD-10-CM

## 2022-05-12 ENCOUNTER — Ambulatory Visit
Admission: RE | Admit: 2022-05-12 | Discharge: 2022-05-12 | Disposition: A | Discharge status: Home / Self Care | Payer: Medicare Other | Source: Ambulatory Visit | Attending: Pulmonary Disease | Admitting: Pulmonary Disease

## 2022-05-12 DIAGNOSIS — J449 Chronic obstructive pulmonary disease, unspecified: Secondary | ICD-10-CM

## 2022-05-12 MED ORDER — IOPAMIDOL (ISOVUE-370) INJECTION 76%
75.0000 mL | Freq: Once | INTRAVENOUS | Status: AC | PRN
  Administered 2022-05-12: 75 mL via INTRAVENOUS

## 2022-10-03 ENCOUNTER — Ambulatory Visit: Payer: Medicare HMO | Admitting: Dermatology

## 2022-10-03 DIAGNOSIS — L814 Other melanin hyperpigmentation: Secondary | ICD-10-CM

## 2022-10-03 DIAGNOSIS — L820 Inflamed seborrheic keratosis: Secondary | ICD-10-CM

## 2022-10-03 DIAGNOSIS — Z1283 Encounter for screening for malignant neoplasm of skin: Secondary | ICD-10-CM | POA: Diagnosis not present

## 2022-10-03 DIAGNOSIS — L570 Actinic keratosis: Secondary | ICD-10-CM | POA: Diagnosis not present

## 2022-10-03 DIAGNOSIS — Z79899 Other long term (current) drug therapy: Secondary | ICD-10-CM

## 2022-10-03 DIAGNOSIS — Z5111 Encounter for antineoplastic chemotherapy: Secondary | ICD-10-CM

## 2022-10-03 DIAGNOSIS — Z8589 Personal history of malignant neoplasm of other organs and systems: Secondary | ICD-10-CM

## 2022-10-03 DIAGNOSIS — L821 Other seborrheic keratosis: Secondary | ICD-10-CM

## 2022-10-03 DIAGNOSIS — L304 Erythema intertrigo: Secondary | ICD-10-CM | POA: Diagnosis not present

## 2022-10-03 DIAGNOSIS — Z85828 Personal history of other malignant neoplasm of skin: Secondary | ICD-10-CM

## 2022-10-03 DIAGNOSIS — L578 Other skin changes due to chronic exposure to nonionizing radiation: Secondary | ICD-10-CM

## 2022-10-03 DIAGNOSIS — D229 Melanocytic nevi, unspecified: Secondary | ICD-10-CM

## 2022-10-03 MED ORDER — HYDROCORTISONE 2.5 % EX CREA: TOPICAL_CREAM | Freq: Every day | CUTANEOUS | 2 refills | Status: DC

## 2022-10-03 MED ORDER — KETOCONAZOLE 2 % EX CREA: 1.0000 | TOPICAL_CREAM | Freq: Every day | CUTANEOUS | 11 refills | Status: AC

## 2022-10-03 NOTE — Patient Instructions (Addendum)
Cryotherapy Aftercare  Wash gently with soap and water everyday.   Apply Vaseline and Band-Aid daily until healed.   For groin- Start ketoconazole 2 % cream daily x 2 months. Start HC 2.5% cream daily at bedtime on Monday, Wednesday and Fridays.   Restart as needed for flares.   Starting 10/25/22 - - Start 5-fluorouracil/calcipotriene cream twice a day for 7 days to affected areas including scalp, temples, nose. Prescription sent to [facility]. Patient advised they will receive an email to purchase the medication online and have it sent to their home. Patient provided with handout reviewing treatment course and side effects and advised to call or message [place] on MyChart with any concerns.  Reviewed course of treatment and expected reaction.  Patient advised to expect inflammation and crusting and advised that erosions are possible.  Patient advised to be diligent with sun protection during and after treatment. Counseled to keep medication out of reach of children and pets.  Instructions for [facility]  One or more of your medications was sent to the [facility] mail order [facility] pharmacy. You will receive an email from them and can purchase the medicine through that link. It will then be mailed to your home at the address you confirmed. If for any reason you do not receive an email from them, please check your spam folder. If you still do not find the email, please let us know. [facility] phone number is [phone].  Melanoma ABCDEs  Melanoma is the most dangerous type of skin cancer, and is the leading cause of death from skin disease.  You are more likely to develop melanoma if you: Have light-colored skin, light-colored eyes, or red or blond hair Spend a lot of time in the sun Tan regularly, either outdoors or in a tanning bed Have had blistering sunburns, especially during childhood Have a close family member who has had a  melanoma Have atypical moles or large birthmarks  Early detection of melanoma is key since treatment is typically straightforward and cure rates are extremely high if we catch it early.   The first sign of melanoma is often a change in a mole or a new dark spot.  The ABCDE system is a way of remembering the signs of melanoma.  A for asymmetry:  The two halves do not match. B for border:  The edges of the growth are irregular. C for color:  A mixture of colors are present instead of an even brown color. D for diameter:  Melanomas are usually (but not always) greater than 14m - the size of a pencil eraser. E for evolution:  The spot keeps changing in size, shape, and color.  Please check your skin once per month between visits. You can use a small mirror in front and a large mirror behind you to keep an eye on the back side or your body.   If you see any new or changing lesions before your next follow-up, please call to schedule a visit.  Please continue daily skin protection including broad spectrum sunscreen SPF 30+ to sun-exposed areas, reapplying every 2 hours as needed when you're outdoors.    Due to recent changes in healthcare laws, you may see results of your pathology and/or laboratory studies on MyChart before the doctors have had a chance to review them. We understand that in some cases there may be results that are confusing or concerning to you. Please understand that not all results are received at  the same time and often the doctors may need to interpret multiple results in order to provide you with the best plan of care or course of treatment. Therefore, we ask that you please give [place] 2 business days to thoroughly review all your results before contacting the office for clarification. Should we see a critical lab result, you will be contacted sooner.   If You Need Anything After Your Visit  If you have any questions or concerns for your doctor, please call our main line at  [phone] and press option 4 to reach your doctor's medical assistant. If no one answers, please leave a voicemail as directed and we will return your call as soon as possible. Messages left after 4 pm will be answered the following business day.   You may also send [place] a message via [place]. We typically respond to MyChart messages within 1-2 business days.  For prescription refills, please ask your pharmacy to contact our office. Our fax number is [phone].  If you have an urgent issue when the clinic is closed that cannot wait until the next business day, you can page your doctor at the number below.    Please note that while we do our best to be available for urgent issues outside of office hours, we are not available 24/7.   If you have an urgent issue and are unable to reach [place], you may choose to seek medical care at your doctor's office, retail clinic, urgent care center, or emergency room.  If you have a medical emergency, please immediately call 911 or go to the emergency department.  Pager Numbers  - Dr. Nehemiah Massed: [phone]  - Dr. Laurence Ferrari: [phone]  - Dr. Nicole Kindred: [phone]  In the event of inclement weather, please call our main line at [phone] for an update on the status of any delays or closures.  Dermatology Medication Tips: Please keep the boxes that topical medications come in in order to help keep track of the instructions about where and how to use these. Pharmacies typically print the medication instructions only on the boxes and not directly on the medication tubes.   If your medication is too expensive, please contact our office at [phone] option 4 or send [place] a message through [place].   We are unable to tell what your co-pay for medications will be in advance as this is different depending on your insurance coverage. However, we may be able to find a substitute medication at lower cost or fill out paperwork to get insurance to cover a needed  medication.   If a prior authorization is required to get your medication covered by your insurance company, please allow [place] 1-2 business days to complete this process.  Drug prices often vary depending on where the prescription is filled and some pharmacies may offer cheaper prices.  The website www.goodrx.com contains coupons for medications through different pharmacies. The prices here do not account for what the cost may be with help from insurance (it may be cheaper with your insurance), but the website can give you the price if you did not use any insurance.  - You can print the associated coupon and take it with your prescription to the pharmacy.  - You may also stop by our office during regular business hours and pick up a GoodRx coupon card.  - If you need your prescription sent electronically to a different pharmacy, notify our office through [facility] or by phone at [phone] option 4.  Si Usted Necesita Algo Despus de Su Visita  Tambin puede enviarnos un mensaje a travs de Pharmacist, community. Por lo general respondemos a los mensajes de MyChart en el transcurso de 1 a 2 das hbiles.  Para renovar recetas, por favor pida a su farmacia que se ponga en contacto con nuestra oficina. Harland Dingwall de fax es [place] [phone].  Si tiene un asunto urgente cuando la clnica est cerrada y que no puede esperar hasta el siguiente da hbil, puede llamar/localizar a su doctor(a) al nmero que aparece a continuacin.   Por favor, tenga en cuenta que aunque hacemos todo lo posible para estar disponibles para asuntos urgentes fuera del horario de [place], no estamos disponibles las 24 horas del da, los 7 das de la [place].   Si tiene un problema urgente y no puede comunicarse con nosotros, puede optar por buscar atencin mdica  en el consultorio de su doctor(a), en una clnica privada, en un centro de atencin urgente o en una sala de emergencias.  Si tiene Engineering geologist,  por favor llame inmediatamente al 911 o vaya a la sala de emergencias.  Nmeros de bper  - Dr. Nehemiah Massed: [phone]  - Dra. Moye: [phone]  - Dra. Nicole Kindred: [phone]  En caso de inclemencias del [place], por favor llame a Johnsie Kindred principal al [phone] para una actualizacin sobre el [place] de cualquier retraso o cierre.  Consejos para la medicacin en dermatologa: Por favor, guarde las cajas en las que vienen los medicamentos de uso tpico para ayudarle a seguir las instrucciones sobre dnde y cmo usarlos. Las farmacias generalmente imprimen las instrucciones del medicamento slo en las cajas y no directamente en los tubos del [place].   Si su medicamento es muy caro, por favor, pngase en contacto con Zigmund Daniel llamando al [phone] y presione la opcin 4 o envenos un mensaje a travs de Pharmacist, community.   No podemos decirle cul ser su copago por los medicamentos por adelantado ya que esto es diferente dependiendo de la cobertura de su seguro. Sin embargo, es posible que podamos encontrar un medicamento sustituto a Electrical engineer un formulario para que el seguro cubra el medicamento que se considera necesario.   Si se requiere una autorizacin previa para que su compaa de seguros [place] su medicamento, por favor permtanos de 1 a 2 das hbiles para completar este proceso.  Los precios de los medicamentos varan con frecuencia dependiendo del Environmental consultant de dnde se surte la receta y alguna farmacias pueden ofrecer precios ms baratos.  El sitio web www.goodrx.com tiene cupones para medicamentos de Airline pilot. Los precios aqu no tienen en cuenta lo que podra costar con la ayuda del seguro (puede ser ms barato con su seguro), pero el sitio web puede darle el precio si no utiliz Research scientist (physical sciences).  - Puede imprimir el cupn correspondiente y llevarlo con su receta a la farmacia.  - Tambin puede pasar por nuestra oficina durante el horario de atencin  regular y Charity fundraiser una tarjeta de cupones de GoodRx.  - Si necesita que su receta se enve electrnicamente a una farmacia diferente, informe a nuestra oficina a travs de MyChart de [place] o por telfono llamando al [phone] y presione la opcin 4.

## 2022-10-03 NOTE — Progress Notes (Signed)
Follow-Up Visit   Subjective  Todd Rojas is a 81 y.o. male who presents for the following: Annual Exam. The patient presents for Total-Body Skin Exam (TBSE) for skin cancer screening and mole check.  The patient has spots, moles and lesions to be evaluated, some may be new or changing and the patient has concerns that these could be cancer. Patient with hx of SCC, BCC and  AK.  The following portions of the chart were reviewed this encounter and updated as appropriate:   Tobacco  Allergies  Meds  Problems  Med Hx  Surg Hx  Fam Hx     Review of Systems:  No other skin or systemic complaints except as noted in HPI or Assessment and Plan.  Objective  Well appearing patient in no apparent distress; mood and affect are within normal limits.  A full examination was performed including scalp, head, eyes, ears, nose, lips, neck, chest, axillae, abdomen, back, buttocks, bilateral upper extremities, bilateral lower extremities, hands, feet, fingers, toes, fingernails, and toenails. All findings within normal limits unless otherwise noted below.  Scalp x 4, nose x 2, L temple x 1 (7) Erythematous thin papules/macules with gritty scale.   anterior neck x 1, R dorsum wrist x 1 (2) Erythematous stuck-on, waxy papule or plaque   Assessment & Plan  AK (actinic keratosis) (7) Scalp x 4, nose x 2, L temple x 1 Actinic keratoses are precancerous spots that appear secondary to cumulative UV radiation exposure/sun exposure over time. They are chronic with expected duration over 1 year. A portion of actinic keratoses will progress to squamous cell carcinoma of the skin. It is not possible to reliably predict which spots will progress to skin cancer and so treatment is recommended to prevent development of skin cancer.  Recommend daily broad spectrum sunscreen SPF 30+ to sun-exposed areas, reapply every 2 hours as needed.  Recommend staying in the shade or wearing long sleeves, sun  glasses (UVA+UVB protection) and wide brim hats (4-inch brim around the entire circumference of the hat). Call for new or changing lesions.  Destruction of lesion - Scalp x 4, nose x 2, L temple x 1 Complexity: simple   Destruction method: cryotherapy   Informed consent: discussed and consent obtained   Timeout:  patient name, date of birth, surgical site, and procedure verified Lesion destroyed using liquid nitrogen: Yes   Region frozen until ice ball extended beyond lesion: Yes   Outcome: patient tolerated procedure well with no complications   Post-procedure details: wound care instructions given    Inflamed seborrheic keratosis (2) anterior neck x 1, R dorsum wrist x 1 Symptomatic, irritating, patient would like treated. Destruction of lesion - anterior neck x 1, R dorsum wrist x 1 Complexity: simple   Destruction method: cryotherapy   Informed consent: discussed and consent obtained   Timeout:  patient name, date of birth, surgical site, and procedure verified Lesion destroyed using liquid nitrogen: Yes   Region frozen until ice ball extended beyond lesion: Yes   Outcome: patient tolerated procedure well with no complications   Post-procedure details: wound care instructions given    Erythema intertrigo With Candida Left Suprapubic Area Start ketoconazole 2 % cream daily x 2 months. Start HC 2.5% cream daily at bedtime on Monday, Wednesday and Fridays.  Chronic and persistent condition with duration or expected duration over one year. Condition is symptomatic / bothersome to patient. Not to goal.  Restart as needed for flares.   ketoconazole (NIZORAL)  2 % cream - Left Suprapubic Area Apply 1 Application topically daily. For 2 months as needed for rash hydrocortisone 2.5 % cream - Left Suprapubic Area Apply topically daily. Apply to affected areas at bedtime on Monday, Wednesdays and Fridays.  Lentigines - Scattered tan macules - Due to sun exposure - Benign-appearing,  observe - Recommend daily broad spectrum sunscreen SPF 30+ to sun-exposed areas, reapply every 2 hours as needed. - Call for any changes  Seborrheic Keratoses - Stuck-on, waxy, tan-brown papules and/or plaques  - Benign-appearing - Discussed benign etiology and prognosis. - Observe - Call for any changes  Melanocytic Nevi - Tan-brown and/or pink-flesh-colored symmetric macules and papules - Benign appearing on exam today - Observation - Call clinic for new or changing moles - Recommend daily use of broad spectrum spf 30+ sunscreen to sun-exposed areas.   Hemangiomas - Red papules - Discussed benign nature - Observe - Call for any changes  Actinic Damage - Severe, confluent actinic changes with pre-cancerous actinic keratoses  - Severe, chronic, not at goal, secondary to cumulative UV radiation exposure over time - diffuse scaly erythematous macules and papules with underlying dyspigmentation - Discussed Prescription "Field Treatment" for Severe, Chronic Confluent Actinic Changes with Pre-Cancerous Actinic Keratoses Field treatment involves treatment of an entire area of skin that has confluent Actinic Changes (Sun/ Ultraviolet light damage) and PreCancerous Actinic Keratoses by method of PhotoDynamic Therapy (PDT) and/or prescription Topical Chemotherapy agents such as 5-fluorouracil, 5-fluorouracil/calcipotriene, and/or imiquimod.  The purpose is to decrease the number of clinically evident and subclinical PreCancerous lesions to prevent progression to development of skin cancer by chemically destroying early precancer changes that may or may not be visible.  It has been shown to reduce the risk of developing skin cancer in the treated area. As a result of treatment, redness, scaling, crusting, and open sores may occur during treatment course. One or more than one of these methods may be used and may have to be used several times to control, suppress and eliminate the PreCancerous  changes. Discussed treatment course, expected reaction, and possible side effects. - Recommend daily broad spectrum sunscreen SPF 30+ to sun-exposed areas, reapply every 2 hours as needed.  - Staying in the shade or wearing long sleeves, sun glasses (UVA+UVB protection) and wide brim hats (4-inch brim around the entire circumference of the hat) are also recommended. - Call for new or changing lesions.  - Start 5-fluorouracil/calcipotriene cream twice a day for 7 days to affected areas including scalp, temples, nose. Prescription sent to [facility]. Patient advised they will receive an email to purchase the medication online and have it sent to their home. Patient provided with handout reviewing treatment course and side effects and advised to call or message [place] on MyChart with any concerns.  Reviewed course of treatment and expected reaction.  Patient advised to expect inflammation and crusting and advised that erosions are possible.  Patient advised to be diligent with sun protection during and after treatment. Counseled to keep medication out of reach of children and pets.  Skin cancer screening performed today.  History of Basal Cell Carcinoma of the Skin - No evidence of recurrence today - Recommend regular full body skin exams - Recommend daily broad spectrum sunscreen SPF 30+ to sun-exposed areas, reapply every 2 hours as needed.  - Call if any new or changing lesions are noted between office visits  History of Squamous Cell Carcinoma of the Skin - No evidence of recurrence today -  No lymphadenopathy - Recommend regular full body skin exams - Recommend daily broad spectrum sunscreen SPF 30+ to sun-exposed areas, reapply every 2 hours as needed.  - Call if any new or changing lesions are noted between office visits  Return in about 6 months (around 04/04/2023) for AK follow up.  Graciella Belton, RMA, am acting as scribe for Sarina Ser, MD  . Documentation: I have reviewed the above documentation for accuracy and completeness, and I agree with the above.  Sarina Ser, MD

## 2022-10-15 ENCOUNTER — Encounter: Payer: Self-pay | Admitting: Dermatology

## 2022-12-28 ENCOUNTER — Ambulatory Visit: Payer: Medicare HMO | Admitting: Dermatology

## 2022-12-28 ENCOUNTER — Encounter: Payer: Self-pay | Admitting: Dermatology

## 2022-12-28 VITALS — BP 166/77 | HR 76

## 2022-12-28 DIAGNOSIS — L989 Disorder of the skin and subcutaneous tissue, unspecified: Secondary | ICD-10-CM

## 2022-12-28 DIAGNOSIS — L723 Sebaceous cyst: Secondary | ICD-10-CM | POA: Diagnosis not present

## 2022-12-28 MED ORDER — MUPIROCIN 2 % EX OINT: TOPICAL_OINTMENT | CUTANEOUS | 1 refills | Status: DC

## 2022-12-28 MED ORDER — DOXYCYCLINE MONOHYDRATE 100 MG PO CAPS: 100.0000 mg | ORAL_CAPSULE | Freq: Two times a day (BID) | ORAL | 0 refills | Status: DC

## 2022-12-28 NOTE — Progress Notes (Signed)
   Follow-Up Visit   Subjective  Todd Rojas is a 82 y.o. male who presents for the following: Skin Problem (Painful lesion on back. Dur: few months. Was bleeding last night. Denies pus drainage ).  The patient has spots, moles and lesions to be evaluated, some may be new or changing and the patient has concerns that these could be cancer.   The following portions of the chart were reviewed this encounter and updated as appropriate:      Review of Systems: No other skin or systemic complaints except as noted in HPI or Assessment and Plan.   Objective  Well appearing patient in no apparent distress; mood and affect are within normal limits.  A focused examination was performed including back. Relevant physical exam findings are noted in the Assessment and Plan.  Right Mid Back Erythematous soft SQ nodule with focal heme crust. 3 x 2 cm   Assessment & Plan  Inflamed epidermoid cyst of skin Right Mid Back  Symptomatic  Start Doxycycline '100mg'$  twice daily for 14 days then once daily until finished.  Apply Mupirocin ointment once daily with bandage change.   Doxycycline should be taken with food to prevent nausea. Do not lay down for 30 minutes after taking. Be cautious with sun exposure and use good sun protection while on this medication. Pregnant women should not take this medication.   Discussed that cyst may recur and would need excision to remove completely   Incision and Drainage - Right Mid Back Location: right mid back  Informed Consent: Discussed risks (permanent scarring, light or dark discoloration, infection, pain, bleeding, bruising, redness, damage to adjacent structures, and recurrence of the lesion) and benefits of the procedure, as well as the alternatives.  Informed consent was obtained.  Preparation: The area was prepped with alcohol.  Anesthesia: Lidocaine 1% with epinephrine  Procedure Details: An incision was made overlying the lesion. The  lesion drained white, chalky cyst material, blood, and clots.  A large amount of fluid was drained.    Antibiotic ointment and a sterile pressure dressing were applied. The patient tolerated procedure well.  Total number of lesions drained: 1  Plan: The patient was instructed on post-op care. Recommend OTC analgesia as needed for pain.   doxycycline (MONODOX) 100 MG capsule - Right Mid Back Take 1 capsule (100 mg total) by mouth 2 (two) times daily. Take with food  mupirocin ointment (BACTROBAN) 2 % - Right Mid Back Apply once daily with dressing changes   Return if symptoms worsen or fail to improve.  I, Emelia Salisbury, CMA, am acting as scribe for Brendolyn Patty, MD.  Documentation: I have reviewed the above documentation for accuracy and completeness, and I agree with the above.  Brendolyn Patty MD

## 2022-12-28 NOTE — Patient Instructions (Addendum)
Start Doxycycline '100mg'$  twice daily for 14 days then once daily until finished.  Doxycycline should be taken with food to prevent nausea. Do not lay down for 30 minutes after taking. Be cautious with sun exposure and use good sun protection while on this medication. Pregnant women should not take this medication.   Incision and Drainage, Care After This sheet gives you information about how to care for yourself after your procedure. Your health care provider may also give you more specific instructions. If you have problems or questions, contact your health care provider. What can I expect after the procedure? After the procedure, it is common to have: Pain or discomfort around the incision site. Blood, fluid, or pus (drainage) from the incision. Redness and firm skin around the incision site. Follow these instructions at home: Medicines Take over-the-counter and prescription medicines only as told by your health care provider. If you were prescribed an antibiotic medicine, use or take it as told by your health care provider. Do not stop using the antibiotic even if you start to feel better. Wound care Follow instructions from your health care provider about how to take care of your wound. Make sure you: Wash your hands with soap and water before and after you change your bandage (dressing). If soap and water are not available, use hand sanitizer. Change your dressing and packing as told by your health care provider. If your dressing is dry or stuck when you try to remove it, moisten or wet the dressing with saline or water so that it can be removed without harming your skin or tissues. If your wound is packed, leave it in place until your health care provider tells you to remove it. To remove the packing, moisten or wet the packing with saline or water so that it can be removed without harming your skin or tissues. Leave stitches (sutures), skin glue, or adhesive strips in place. These skin  closures may need to stay in place for 2 weeks or longer. If adhesive strip edges start to loosen and curl up, you may trim the loose edges. Do not remove adhesive strips completely unless your health care provider tells you to do that. Check your wound every day for signs of infection. Check for: More redness, swelling, or pain. More fluid or blood. Warmth. Pus or a bad smell. If you were sent home with a drain tube in place, follow instructions from your health care provider about: How to empty it. How to care for it at home.  General instructions Rest the affected area. Do not take baths, swim, or use a hot tub until your health care provider approves. Ask your health care provider if you may take showers. You may only be allowed to take sponge baths. Return to your normal activities as told by your health care provider. Ask your health care provider what activities are safe for you. Your health care provider may put you on activity or lifting restrictions. The incision will continue to drain. It is normal to have some clear or slightly bloody drainage. The amount of drainage should lessen each day. Do not apply any creams, ointments, or liquids unless you have been told to by your health care provider. Keep all follow-up visits as told by your health care provider. This is important. Contact a health care provider if: Your cyst or abscess returns. You have more redness, swelling, or pain around your incision. You have more fluid or blood coming from your incision. Your incision  feels warm to the touch. You have pus or a bad smell coming from your incision. You have red streaks above or below the incision site. Get help right away if: You have severe pain or bleeding. You cannot eat or drink without vomiting. You have a fever or chills. You have redness that spreads quickly. You have decreased urine output. You become short of breath. You have chest pain. You cough up blood. The  affected area becomes numb or starts to tingle. These symptoms may represent a serious problem that is an emergency. Do not wait to see if the symptoms will go away. Get medical help right away. Call your local emergency services (911 in the [place] Do not drive yourself to the hospital. Summary After this procedure, it is common to have fluid, blood, or pus coming from the surgery site. Follow all home care instructions. You will be told how to take care of your incision, how to check for infection, and how to take medicines. If you were prescribed an antibiotic medicine, take it as told by your health care provider. Do not stop taking the antibiotic even if you start to feel better. Contact a health care provider if you have increased redness, swelling, or pain around your incision. Get help right away if you have chest pain, you vomit, you cough up blood, or you have shortness of breath. Keep all follow-up visits as told by your health care provider. This is important. This information is not intended to replace advice given to you by your health care provider. Make sure you discuss any questions you have with your health care provider. Document Revised: 03/10/2022 Document Reviewed: 09/16/2021 Elsevier Patient Education  [place].    Due to recent changes in healthcare laws, you may see results of your pathology and/or laboratory studies on MyChart before the doctors have had a chance to review them. We understand that in some cases there may be results that are confusing or concerning to you. Please understand that not all results are received at the same time and often the doctors may need to interpret multiple results in order to provide you with the best plan of care or course of treatment. Therefore, we ask that you please give [place] 2 business days to thoroughly review all your results before contacting the office for clarification. Should we see a critical lab result, you will be  contacted sooner.   If You Need Anything After Your Visit  If you have any questions or concerns for your doctor, please call our main line at [phone] and press option 4 to reach your doctor's medical assistant. If no one answers, please leave a voicemail as directed and we will return your call as soon as possible. Messages left after 4 pm will be answered the following business day.   You may also send [place] a message via [place]. We typically respond to MyChart messages within 1-2 business days.  For prescription refills, please ask your pharmacy to contact our office. Our fax number is [phone].  If you have an urgent issue when the clinic is closed that cannot wait until the next business day, you can page your doctor at the number below.    Please note that while we do our best to be available for urgent issues outside of office hours, we are not available 24/7.   If you have an urgent issue and are unable to reach [place], you may choose to seek medical care at your  doctor's office, retail clinic, urgent care center, or emergency room.  If you have a medical emergency, please immediately call 911 or go to the emergency department.  Pager Numbers  - Dr. Nehemiah Massed: [phone]  - Dr. Laurence Ferrari: [phone]  - Dr. Nicole Kindred: [phone]  In the event of inclement weather, please call our main line at [phone] for an update on the status of any delays or closures.  Dermatology Medication Tips: Please keep the boxes that topical medications come in in order to help keep track of the instructions about where and how to use these. Pharmacies typically print the medication instructions only on the boxes and not directly on the medication tubes.   If your medication is too expensive, please contact our office at [phone] option 4 or send [place] a message through [place].   We are unable to tell what your co-pay for medications will be in advance as this is different depending on your  insurance coverage. However, we may be able to find a substitute medication at lower cost or fill out paperwork to get insurance to cover a needed medication.   If a prior authorization is required to get your medication covered by your insurance company, please allow [place] 1-2 business days to complete this process.  Drug prices often vary depending on where the prescription is filled and some pharmacies may offer cheaper prices.  The website www.goodrx.com contains coupons for medications through different pharmacies. The prices here do not account for what the cost may be with help from insurance (it may be cheaper with your insurance), but the website can give you the price if you did not use any insurance.  - You can print the associated coupon and take it with your prescription to the pharmacy.  - You may also stop by our office during regular business hours and pick up a GoodRx coupon card.  - If you need your prescription sent electronically to a different pharmacy, notify our office through [facility] or by phone at [phone] option 4.     Si Usted Necesita Algo Despus de Su Visita  Tambin puede enviarnos un mensaje a travs de Pharmacist, community. Por lo general respondemos a los mensajes de MyChart en el transcurso de 1 a 2 das hbiles.  Para renovar recetas, por favor pida a su farmacia que se ponga en contacto con nuestra oficina. Harland Dingwall de fax es [place] [phone].  Si tiene un asunto urgente cuando la clnica est cerrada y que no puede esperar hasta el siguiente da hbil, puede llamar/localizar a su doctor(a) al nmero que aparece a continuacin.   Por favor, tenga en cuenta que aunque hacemos todo lo posible para estar disponibles para asuntos urgentes fuera del horario de [place], no estamos disponibles las 24 horas del da, los 7 das de la [place].   Si tiene un problema urgente y no puede comunicarse con nosotros, puede optar por buscar atencin mdica  en el  consultorio de su doctor(a), en una clnica privada, en un centro de atencin urgente o en una sala de emergencias.  Si tiene Engineering geologist, por favor llame inmediatamente al 911 o vaya a la sala de emergencias.  Nmeros de bper  - Dr. Nehemiah Massed: [phone]  - Dra. Moye: [phone]  - Dra. Nicole Kindred: [phone]  En caso de inclemencias del [place], por favor llame a Johnsie Kindred principal al [phone] para una actualizacin sobre el [place] de cualquier retraso o cierre.  Consejos para la medicacin en dermatologa: Por  favor, guarde las cajas en las que vienen los medicamentos de uso tpico para ayudarle a seguir las instrucciones sobre dnde y cmo usarlos. Las farmacias generalmente imprimen las instrucciones del medicamento slo en las cajas y no directamente en los tubos del [place].   Si su medicamento es muy caro, por favor, pngase en contacto con Zigmund Daniel llamando al [phone] y presione la opcin 4 o envenos un mensaje a travs de Pharmacist, community.   No podemos decirle cul ser su copago por los medicamentos por adelantado ya que esto es diferente dependiendo de la cobertura de su seguro. Sin embargo, es posible que podamos encontrar un medicamento sustituto a Electrical engineer un formulario para que el seguro cubra el medicamento que se considera necesario.   Si se requiere una autorizacin previa para que su compaa de seguros [place] su medicamento, por favor permtanos de 1 a 2 das hbiles para completar este proceso.  Los precios de los medicamentos varan con frecuencia dependiendo del Environmental consultant de dnde se surte la receta y alguna farmacias pueden ofrecer precios ms baratos.  El sitio web www.goodrx.com tiene cupones para medicamentos de Airline pilot. Los precios aqu no tienen en cuenta lo que podra costar con la ayuda del seguro (puede ser ms barato con su seguro), pero el sitio web puede darle el precio si no utiliz Research scientist (physical sciences).  - Puede  imprimir el cupn correspondiente y llevarlo con su receta a la farmacia.  - Tambin puede pasar por nuestra oficina durante el horario de atencin regular y Charity fundraiser una tarjeta de cupones de GoodRx.  - Si necesita que su receta se enve electrnicamente a una farmacia diferente, informe a nuestra oficina a travs de MyChart de [place] o por telfono llamando al [phone] y presione la opcin 4.

## 2023-01-06 ENCOUNTER — Ambulatory Visit: Payer: Medicare HMO | Admitting: Anesthesiology

## 2023-01-06 ENCOUNTER — Encounter: Payer: Self-pay | Admitting: *Deleted

## 2023-01-06 ENCOUNTER — Ambulatory Visit
Admission: RE | Admit: 2023-01-06 | Discharge: 2023-01-06 | Disposition: A | Discharge status: Home / Self Care | Payer: Medicare HMO | Attending: Gastroenterology | Admitting: Gastroenterology

## 2023-01-06 ENCOUNTER — Other Ambulatory Visit: Payer: Self-pay

## 2023-01-06 ENCOUNTER — Encounter
Admission: RE | Disposition: A | Discharge status: Home / Self Care | Payer: Self-pay | Source: Home / Self Care | Attending: Gastroenterology

## 2023-01-06 DIAGNOSIS — Z955 Presence of coronary angioplasty implant and graft: Secondary | ICD-10-CM | POA: Insufficient documentation

## 2023-01-06 DIAGNOSIS — I130 Hypertensive heart and chronic kidney disease with heart failure and stage 1 through stage 4 chronic kidney disease, or unspecified chronic kidney disease: Secondary | ICD-10-CM | POA: Insufficient documentation

## 2023-01-06 DIAGNOSIS — N189 Chronic kidney disease, unspecified: Secondary | ICD-10-CM | POA: Diagnosis not present

## 2023-01-06 DIAGNOSIS — K5730 Diverticulosis of large intestine without perforation or abscess without bleeding: Secondary | ICD-10-CM | POA: Diagnosis not present

## 2023-01-06 DIAGNOSIS — I252 Old myocardial infarction: Secondary | ICD-10-CM | POA: Insufficient documentation

## 2023-01-06 DIAGNOSIS — K640 First degree hemorrhoids: Secondary | ICD-10-CM | POA: Diagnosis not present

## 2023-01-06 DIAGNOSIS — Z86010 Personal history of colonic polyps: Secondary | ICD-10-CM | POA: Diagnosis present

## 2023-01-06 DIAGNOSIS — M1990 Unspecified osteoarthritis, unspecified site: Secondary | ICD-10-CM | POA: Diagnosis not present

## 2023-01-06 DIAGNOSIS — J4489 Other specified chronic obstructive pulmonary disease: Secondary | ICD-10-CM | POA: Insufficient documentation

## 2023-01-06 DIAGNOSIS — I2510 Atherosclerotic heart disease of native coronary artery without angina pectoris: Secondary | ICD-10-CM | POA: Diagnosis not present

## 2023-01-06 DIAGNOSIS — Z1211 Encounter for screening for malignant neoplasm of colon: Secondary | ICD-10-CM | POA: Insufficient documentation

## 2023-01-06 DIAGNOSIS — I5022 Chronic systolic (congestive) heart failure: Secondary | ICD-10-CM | POA: Insufficient documentation

## 2023-01-06 DIAGNOSIS — Z87891 Personal history of nicotine dependence: Secondary | ICD-10-CM | POA: Insufficient documentation

## 2023-01-06 HISTORY — PX: COLONOSCOPY WITH PROPOFOL: SHX5780

## 2023-01-06 SURGERY — COLONOSCOPY WITH PROPOFOL
Anesthesia: General

## 2023-01-06 MED ORDER — PROPOFOL 10 MG/ML IV BOLUS
INTRAVENOUS | Status: DC | PRN
  Administered 2023-01-06: 40 mg via INTRAVENOUS

## 2023-01-06 MED ORDER — PROPOFOL 1000 MG/100ML IV EMUL
INTRAVENOUS | Status: AC
  Filled 2023-01-06: qty 100

## 2023-01-06 MED ORDER — PROPOFOL 500 MG/50ML IV EMUL
INTRAVENOUS | Status: DC | PRN
  Administered 2023-01-06: 150 ug/kg/min via INTRAVENOUS

## 2023-01-06 MED ORDER — SODIUM CHLORIDE 0.9 % IV SOLN: INTRAVENOUS | Status: DC

## 2023-01-06 NOTE — Interval H&P Note (Signed)
History and Physical Interval Note:  01/06/2023 11:17 AM  Todd Rojas  has presented today for surgery, with the diagnosis of h/o TA Polyps.  The various methods of treatment have been discussed with the patient and family. After consideration of risks, benefits and other options for treatment, the patient has consented to  Procedure(s): COLONOSCOPY WITH PROPOFOL (N/A) as a surgical intervention.  The patient's history has been reviewed, patient examined, no change in status, stable for surgery.  I have reviewed the patient's chart and labs.  Questions were answered to the patient's satisfaction.     Lesly Rubenstein  Ok to proceed with colonoscopy

## 2023-01-06 NOTE — Anesthesia Preprocedure Evaluation (Addendum)
Anesthesia Evaluation  Patient identified by MRN, date of birth, ID band Patient awake    Reviewed: Allergy & Precautions, NPO status , Patient's Chart, lab work & pertinent test results  History of Anesthesia Complications Negative for: history of anesthetic complications  Airway Mallampati: III   Neck ROM: Full    Dental no notable dental hx.    Pulmonary asthma , COPD, former smoker (quit 1995)   Pulmonary exam normal breath sounds clear to auscultation       Cardiovascular hypertension, + CAD (s/p MI and stents) and +CHF  Normal cardiovascular exam Rhythm:Regular Rate:Normal  Echo 05/17/22:  NORMAL LEFT VENTRICULAR SYSTOLIC FUNCTION NORMAL RIGHT VENTRICULAR SYSTOLIC FUNCTION TRIVIAL REGURGITATION NOTED  NO VALVULAR STENOSIS ESTIMATED LVEF 55% (CALCULATED: 55%) Aortic: NORMAL GRADIENTS Mitral: TRIVIAL MR Tricuspid: TRIVIAL TR Pulmonic: TRIVIAL PI EF >55%    Neuro/Psych HOH    GI/Hepatic ,GERD  ,,  Endo/Other  negative endocrine ROS    Renal/GU Renal disease (nephrolithiasis)     Musculoskeletal  (+) Arthritis ,  Gout    Abdominal   Peds  Hematology negative hematology ROS (+)   Anesthesia Other Findings Cardiology note 08/09/22:  1. Coronary artery disease involving native coronary artery of native heart without angina pectoris CAD history with anterior STEMI in 2006 with LAD PCI, redo LAD PCI 2010, 05/2021 PCI to LCx, LAD, and OM1. No angina symptoms today. Has some mild shortness of breath which is chronic and stable. - Continue CV meds:  Antiplatelet(s)/Anticoagulant(s): asa 81 mg daily, plavix 75 mg daily Beta Blocker: none, already bradycardic ACEi/ARB/ARNi: lisinopril 20 mg daily Hydral/Nitrates: prn NTG CCB: None Diuretics: lasix 40 mg daily as needed MRA: None, previously discussed about aldactone with Dr. Posey Pronto, but EF has recovered SGLT2i/GLP1: None at this time Lipid-lowering agents:  Switching from pravastatin 80 mg daily to Crestor 20 mg daily  2. Chronic systolic heart failure (CMS-HCC) Previously had EF 45%, but most recent echo done 04/2022 with EF >55%.  -Continue lisinopril Lasix as needed  3. Primary hypertension Blood pressure is well controlled in clinic today. - Continue lisinopril  4. Dyslipidemia, unspecified Last lipid panel checked 04/21/2022 with LDL 60. Generally well controlled, however, with his extensive CAD history, may benefit from further LDL lowering. Currently on max dose pravastatin. We discussed switching from pravastatin to Crestor, he does not recall being trialed on any other statin. -Switching pravastatin to Crestor 20 mg daily -Last CMP checked 04/21/2022 with normal LFTs -We will send order for repeat CMP in about 2 weeks to his primary care so he can get this done locally.    Reproductive/Obstetrics                             Anesthesia Physical Anesthesia Plan  ASA: 3  Anesthesia Plan: General   Post-op Pain Management:    Induction: Intravenous  PONV Risk Score and Plan: 2 and Propofol infusion, TIVA and Treatment may vary due to age or medical condition  Airway Management Planned: Natural Airway  Additional Equipment:   Intra-op Plan:   Post-operative Plan:   Informed Consent: I have reviewed the patients History and Physical, chart, labs and discussed the procedure including the risks, benefits and alternatives for the proposed anesthesia with the patient or authorized representative who has indicated his/her understanding and acceptance.       Plan Discussed with: CRNA  Anesthesia Plan Comments: (LMA/GETA backup discussed.  Patient consented for risks of  anesthesia including but not limited to:  - adverse reactions to medications - damage to eyes, teeth, lips or other oral mucosa - nerve damage due to positioning  - sore throat or hoarseness - damage to heart, brain, nerves, lungs, other  parts of body or loss of life  Informed patient about role of CRNA in peri- and intra-operative care.  Patient voiced understanding.)        Anesthesia Quick Evaluation

## 2023-01-06 NOTE — Transfer of Care (Signed)
Immediate Anesthesia Transfer of Care Note  Patient: Todd Rojas  Procedure(s) Performed: COLONOSCOPY WITH PROPOFOL  Patient Location: PACU  Anesthesia Type:MAC  Level of Consciousness: awake, alert , and oriented  Airway & Oxygen Therapy: Patient Spontanous Breathing and Patient connected to nasal cannula oxygen  Post-op Assessment: Report given to RN and Post -op Vital signs reviewed and stable  Post vital signs: Reviewed and stable  Last Vitals:  Vitals Value Taken Time  BP    Temp    Pulse    Resp    SpO2      Last Pain:  Vitals:   01/06/23 1019  TempSrc: Temporal  PainSc: 0-No pain         Complications: No notable events documented.

## 2023-01-06 NOTE — H&P (Signed)
Outpatient short stay form Pre-procedure 01/06/2023  Lesly Rubenstein, MD  Primary Physician: Tracie Harrier, MD  Reason for visit:  Surveillance colonoscopy  History of present illness:    82 y/o gentleman with history of hypertension, CAD, and HFrEF here for surveillance colonoscopy. Last colonoscopy in 2019 with three polyps. Last dose of plavix was 5 days ago. No significant abdominal surgeries. No family history of GI malignancies.    Current Facility-Administered Medications:    0.9 %  sodium chloride infusion, , Intravenous, Continuous, Chiante Peden, Hilton Cork, MD, Last Rate: 20 mL/hr at 01/06/23 1049, Continued from Pre-op at 01/06/23 1049  Medications Prior to Admission  Medication Sig Dispense Refill Last Dose   allopurinol (ZYLOPRIM) 300 MG tablet Take 300 mg by mouth daily.    01/05/2023   aspirin EC 81 MG tablet Take 81 mg by mouth daily.   01/05/2023   cyanocobalamin (,VITAMIN B-12,) 1000 MCG/ML injection Inject 1,000 mcg into the muscle every 30 (thirty) days.    01/05/2023   furosemide (LASIX) 20 MG tablet Take 20 mg by mouth daily.   Past Week   lisinopril (PRINIVIL,ZESTRIL) 40 MG tablet Take 40 mg by mouth daily.   01/05/2023   lisinopril (ZESTRIL) 20 MG tablet Take 20 mg by mouth daily.   01/05/2023   vitamin B-12 (CYANOCOBALAMIN) 1000 MCG tablet Take 1,000 mcg by mouth daily.   Past Week   acetaminophen (TYLENOL) 500 MG tablet Take 1,000 mg by mouth every 8 (eight) hours as needed for mild pain.      albuterol (PROVENTIL HFA;VENTOLIN HFA) 108 (90 Base) MCG/ACT inhaler Inhale 1 puff into the lungs every 6 (six) hours as needed for wheezing or shortness of breath.      amoxicillin (AMOXIL) 500 MG capsule Take by mouth.      budesonide-formoterol (SYMBICORT) 160-4.5 MCG/ACT inhaler Inhale into the lungs.      colchicine 0.6 MG tablet Take 0.6 mg by mouth daily. (Patient not taking: Reported on 09/06/2021)      doxycycline (MONODOX) 100 MG capsule Take 1 capsule (100 mg  total) by mouth 2 (two) times daily. Take with food 60 capsule 0    hydrocortisone 2.5 % cream Apply topically daily. Apply to affected areas at bedtime on Monday, Wednesdays and Fridays. 60 g 2    mupirocin ointment (BACTROBAN) 2 % Apply once daily with dressing changes 22 g 1    nitroGLYCERIN (NITROSTAT) 0.4 MG SL tablet Place under the tongue.      pravastatin (PRAVACHOL) 80 MG tablet Take 80 mg by mouth every evening.       RIVAROXABAN PO       UNABLE TO FIND Take 100 mg by mouth daily. Study drug: Aspirin 100 mg  Dr Posey Pronto @@ [facility] is monitoring this study      VANISHPOINT TUBERCULIN SYRINGE 25G X 1" 1 ML MISC   5      No Known Allergies   Past Medical History:  Diagnosis Date   Actinic keratosis    Arthritis    Asthma    Basal cell carcinoma 03/17/2020   right ant lat deltoid   CAD (coronary artery disease) 02/13/2012   Overview:   12/2004 - Anginal equivalent symptoms:  Left arm pain, weakness, and shortness of breath.   12/2004:  Anterior STEMI.  Transferred to [facility].   01/06/2005 - Cardiac cath:  EF 51%, an occluded mid LAD and a large OM2 with 75% ostial stenosis.  PCI of the mid occluded LAD  using a Cypher stent.   03/2005 - 2D echo:  EF >55%, LAE 4.7 cm, no significant valvular disease.   02/2008:  Plavix stopped.   06/2008:  Recurrent angina - symptoms of chest tightness.  Presented to [facility].  Cardiac catheterization.  The patient stated nonobstructive CAD seen.  Plavix restarted at that time.   03/2009 - 2D echo:  EF 35%, akinetic apex and septum.  Stress echocardiogram deferred for cardiac catheterization.   03/30/2009:  PCI 90% proximal LAD with a 3.0 x 18 mm Xience DES.   02/2010: Slow and gradual return of chest tightness, fatigue and generalized weakness concerning for return of anginal equivalent.  Stress test did not show ischemia to the heart muscle during exercise.  Evidenc   CHF (congestive heart failure) (HCC)    Chronic kidney disease     kidney stone left side   COPD (chronic obstructive pulmonary disease) ([employer]) 07/03/2017   Dyslipidemia 02/13/2012   Overview:   03/2009  TC 158, TG 88, HDL 39, LDL 102.  11/2013  TC 148, TG 115, HDL 44.3, LDL 80.7 (done at PCP's office)   10/2014 TC 146  11/2015 TC 123, TG 86, HDL 41.7, LDL 64  11/2016 TC 140, TG 129, HDL 40.1, LDL 74    Dyspnea    GERD (gastroesophageal reflux disease)    GI disease 03/09/2013   Overview:   Dysphagia - EGD demonstrated esophageal stricture which was dilated   GERD   Hemorrhoids    Hearing loss 03/09/2013   History of kidney stones    Hyperlipidemia, unspecified 07/03/2017   Hypertension 02/13/2012   Myocardial infarction [facility]) 2004   Personal history of gout 12/09/2016   Primary osteoarthritis of both knees 05/05/2015   Right-sided low back pain without sciatica 04/14/2016   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   R ear - ED&C    Review of systems:  Otherwise negative.    Physical Exam  Gen: Alert, oriented. Appears stated age.  HEENT: PERRLA. Lungs: No respiratory distress CV: RRR Abd: soft, benign, no masses Ext: No edema    Planned procedures: Proceed with colonoscopy. The patient understands the nature of the planned procedure, indications, risks, alternatives and potential complications including but not limited to bleeding, infection, perforation, damage to internal organs and possible oversedation/side effects from anesthesia. The patient agrees and gives consent to proceed.  Please refer to procedure notes for findings, recommendations and patient disposition/instructions.     Lesly Rubenstein, MD [facility] Gastroenterology

## 2023-01-06 NOTE — Op Note (Signed)
[facility] Gastroenterology Patient Name: Todd Rojas Procedure Date: 01/06/2023 11:14 AM MRN: [id number] Account #: [id number] Date of Birth: Aug 09, 1941 Admit Type: Outpatient Age: 82 Room: [facility] ENDO ROOM 3 Gender: Male Note Status: Finalized Instrument Name: Jasper Riling [id number] Procedure:             Colonoscopy Indications:           Surveillance: Personal history of adenomatous polyps                         on last colonoscopy > 3 years ago Providers:             Andrey Farmer MD, MD Referring MD:          Tracie Harrier, MD (Referring MD) Medicines:             Monitored Anesthesia Care Complications:         No immediate complications. Procedure:             Pre-Anesthesia Assessment:                        - Prior to the procedure, a History and Physical was                         performed, and patient medications and allergies were                         reviewed. The patient is competent. The risks and                         benefits of the procedure and the sedation options and                         risks were discussed with the patient. All questions                         were answered and informed consent was obtained.                         Patient identification and proposed procedure were                         verified by the physician, the nurse, the                         anesthesiologist, the anesthetist and the technician                         in the endoscopy suite. Mental Status Examination:                         alert and oriented. Airway Examination: normal                         oropharyngeal airway and neck mobility. Respiratory                         Examination: clear to auscultation. CV Examination:  normal. Prophylactic Antibiotics: The patient does not                         require prophylactic antibiotics. Prior                         Anticoagulants: The patient has taken Plavix                          (clopidogrel), last dose was 5 days prior to                         procedure. ASA Grade Assessment: III - A patient with                         severe systemic disease. After reviewing the risks and                         benefits, the patient was deemed in satisfactory                         condition to undergo the procedure. The anesthesia                         plan was to use monitored anesthesia care (MAC).                         Immediately prior to administration of medications,                         the patient was re-assessed for adequacy to receive                         sedatives. The heart rate, respiratory rate, oxygen                         saturations, blood pressure, adequacy of pulmonary                         ventilation, and response to care were monitored                         throughout the procedure. The physical status of the                         patient was re-assessed after the procedure.                        After obtaining informed consent, the colonoscope was                         passed under direct vision. Throughout the procedure,                         the patient's blood pressure, pulse, and oxygen                         saturations were monitored continuously. The  Colonoscope was introduced through the anus and                         advanced to the the cecum, identified by appendiceal                         orifice and ileocecal valve. The colonoscopy was                         performed without difficulty. The patient tolerated                         the procedure well. The quality of the bowel                         preparation was good. The ileocecal valve, appendiceal                         orifice, and rectum were photographed. Findings:      The perianal and digital rectal examinations were normal.      A few small-mouthed diverticula were found in the sigmoid colon.       Internal hemorrhoids were found during retroflexion. The hemorrhoids       were Grade I (internal hemorrhoids that do not prolapse).      The exam was otherwise without abnormality on direct and retroflexion       views. Impression:            - Diverticulosis in the sigmoid colon.                        - Internal hemorrhoids.                        - The examination was otherwise normal on direct and                         retroflexion views.                        - No specimens collected. Recommendation:        - Discharge patient to home.                        - Resume previous diet.                        - Resume Plavix (clopidogrel) at prior dose today.                        - Repeat colonoscopy is not recommended due to current                         age (88 years or older) for surveillance.                        - Return to referring physician as previously                         scheduled. Procedure Code(s):     --- Professional ---  45378, Colonoscopy, flexible; diagnostic, including                         collection of specimen(s) by brushing or washing, when                         performed (separate procedure) Diagnosis Code(s):     --- Professional ---                        Z86.010, Personal history of colonic polyps                        K64.0, First degree hemorrhoids                        K57.30, Diverticulosis of large intestine without                         perforation or abscess without bleeding CPT copyright 2022 American Medical Association. All rights reserved. The codes documented in this report are preliminary and upon coder review may  be revised to meet current compliance requirements. Andrey Farmer MD, MD 01/06/2023 11:44:28 AM Number of Addenda: 0 Note Initiated On: 01/06/2023 11:14 AM Scope Withdrawal Time: 0 hours 9 minutes 20 seconds  Total Procedure Duration: 0 hours 14 minutes 22 seconds  Estimated Blood Loss:   Estimated blood loss: none.      [facility]

## 2023-01-06 NOTE — Anesthesia Postprocedure Evaluation (Signed)
Anesthesia Post Note  Patient: Todd Rojas  Procedure(s) Performed: COLONOSCOPY WITH PROPOFOL  Patient location during evaluation: PACU Anesthesia Type: General Level of consciousness: awake and alert, oriented and patient cooperative Pain management: pain level controlled Vital Signs Assessment: post-procedure vital signs reviewed and stable Respiratory status: spontaneous breathing, nonlabored ventilation and respiratory function stable Cardiovascular status: blood pressure returned to baseline and stable Postop Assessment: adequate PO intake Anesthetic complications: no   No notable events documented.   Last Vitals:  Vitals:   01/06/23 1019 01/06/23 1146  BP: 127/60 109/72  Pulse: (!) 58 61  Resp: 16 (!) 21  Temp: (!) 36.4 C (!) 36.1 C  SpO2: 97% 96%    Last Pain:  Vitals:   01/06/23 1206  TempSrc:   PainSc: 0-No pain                 Darrin Nipper

## 2023-01-09 ENCOUNTER — Encounter: Payer: Self-pay | Admitting: Gastroenterology

## 2023-04-05 ENCOUNTER — Ambulatory Visit: Payer: Medicare HMO | Admitting: Dermatology

## 2023-04-05 VITALS — BP 117/63

## 2023-04-05 DIAGNOSIS — L820 Inflamed seborrheic keratosis: Secondary | ICD-10-CM | POA: Diagnosis not present

## 2023-04-05 DIAGNOSIS — L720 Epidermal cyst: Secondary | ICD-10-CM

## 2023-04-05 DIAGNOSIS — Z7189 Other specified counseling: Secondary | ICD-10-CM | POA: Diagnosis not present

## 2023-04-05 DIAGNOSIS — Z1283 Encounter for screening for malignant neoplasm of skin: Secondary | ICD-10-CM

## 2023-04-05 DIAGNOSIS — L578 Other skin changes due to chronic exposure to nonionizing radiation: Secondary | ICD-10-CM

## 2023-04-05 DIAGNOSIS — L570 Actinic keratosis: Secondary | ICD-10-CM

## 2023-04-05 DIAGNOSIS — L729 Follicular cyst of the skin and subcutaneous tissue, unspecified: Secondary | ICD-10-CM

## 2023-04-05 DIAGNOSIS — D229 Melanocytic nevi, unspecified: Secondary | ICD-10-CM

## 2023-04-05 DIAGNOSIS — L821 Other seborrheic keratosis: Secondary | ICD-10-CM

## 2023-04-05 DIAGNOSIS — L814 Other melanin hyperpigmentation: Secondary | ICD-10-CM

## 2023-04-05 NOTE — Patient Instructions (Signed)
Cryotherapy Aftercare  Wash gently with soap and water everyday.   Apply Vaseline and Band-Aid daily until healed.     Due to recent changes in healthcare laws, you may see results of your pathology and/or laboratory studies on MyChart before the doctors have had a chance to review them. We understand that in some cases there may be results that are confusing or concerning to you. Please understand that not all results are received at the same time and often the doctors may need to interpret multiple results in order to provide you with the best plan of care or course of treatment. Therefore, we ask that you please give us 2 business days to thoroughly review all your results before contacting the office for clarification. Should we see a critical lab result, you will be contacted sooner.   If You Need Anything After Your Visit  If you have any questions or concerns for your doctor, please call our main line at [phone] and press option 4 to reach your doctor's medical assistant. If no one answers, please leave a voicemail as directed and we will return your call as soon as possible. Messages left after 4 pm will be answered the following business day.   You may also send us a message via MyChart. We typically respond to MyChart messages within 1-2 business days.  For prescription refills, please ask your pharmacy to contact our office. Our fax number is [phone].  If you have an urgent issue when the clinic is closed that cannot wait until the next business day, you can page your doctor at the number below.    Please note that while we do our best to be available for urgent issues outside of office hours, we are not available 24/7.   If you have an urgent issue and are unable to reach us, you may choose to seek medical care at your doctor's office, retail clinic, urgent care center, or emergency room.  If you have a medical emergency, please immediately call 911 or go to the  emergency department.  Pager Numbers  - Dr. Kowalski: [phone]  - Dr. Moye: [phone]  - Dr. Stewart: [phone]  In the event of inclement weather, please call our main line at [phone] for an update on the status of any delays or closures.  Dermatology Medication Tips: Please keep the boxes that topical medications come in in order to help keep track of the instructions about where and how to use these. Pharmacies typically print the medication instructions only on the boxes and not directly on the medication tubes.   If your medication is too expensive, please contact our office at [phone] option 4 or send us a message through MyChart.   We are unable to tell what your co-pay for medications will be in advance as this is different depending on your insurance coverage. However, we may be able to find a substitute medication at lower cost or fill out paperwork to get insurance to cover a needed medication.   If a prior authorization is required to get your medication covered by your insurance company, please allow us 1-2 business days to complete this process.  Drug prices often vary depending on where the prescription is filled and some pharmacies may offer cheaper prices.  The website www.goodrx.com contains coupons for medications through different pharmacies. The prices here do not account for what the cost may be with help from insurance (it may be cheaper with your insurance), but the website can   give you the price if you did not use any insurance.  - You can print the associated coupon and take it with your prescription to the pharmacy.  - You may also stop by our office during regular business hours and pick up a GoodRx coupon card.  - If you need your prescription sent electronically to a different pharmacy, notify our office through [facility] MyChart or by phone at [phone] option 4.     Si Usted Necesita Algo Despus de Su Visita  Tambin puede  enviarnos un mensaje a travs de MyChart. Por lo general respondemos a los mensajes de MyChart en el transcurso de 1 a 2 das hbiles.  Para renovar recetas, por favor pida a su farmacia que se ponga en contacto con nuestra oficina. Nuestro nmero de fax es el [phone].  Si tiene un asunto urgente cuando la clnica est cerrada y que no puede esperar hasta el siguiente da hbil, puede llamar/localizar a su doctor(a) al nmero que aparece a continuacin.   Por favor, tenga en cuenta que aunque hacemos todo lo posible para estar disponibles para asuntos urgentes fuera del horario de oficina, no estamos disponibles las 24 horas del da, los 7 das de la semana.   Si tiene un problema urgente y no puede comunicarse con nosotros, puede optar por buscar atencin mdica  en el consultorio de su doctor(a), en una clnica privada, en un centro de atencin urgente o en una sala de emergencias.  Si tiene una emergencia mdica, por favor llame inmediatamente al 911 o vaya a la sala de emergencias.  Nmeros de bper  - Dr. Kowalski: [phone]  - Dra. Moye: [phone]  - Dra. Stewart: [phone]  En caso de inclemencias del tiempo, por favor llame a nuestra lnea principal al [phone] para una actualizacin sobre el estado de cualquier retraso o cierre.  Consejos para la medicacin en dermatologa: Por favor, guarde las cajas en las que vienen los medicamentos de uso tpico para ayudarle a seguir las instrucciones sobre dnde y cmo usarlos. Las farmacias generalmente imprimen las instrucciones del medicamento slo en las cajas y no directamente en los tubos del medicamento.   Si su medicamento es muy caro, por favor, pngase en contacto con nuestra oficina llamando al [phone] y presione la opcin 4 o envenos un mensaje a travs de MyChart.   No podemos decirle cul ser su copago por los medicamentos por adelantado ya que esto es diferente dependiendo de la cobertura de su seguro.  Sin embargo, es posible que podamos encontrar un medicamento sustituto a menor costo o llenar un formulario para que el seguro cubra el medicamento que se considera necesario.   Si se requiere una autorizacin previa para que su compaa de seguros cubra su medicamento, por favor permtanos de 1 a 2 das hbiles para completar este proceso.  Los precios de los medicamentos varan con frecuencia dependiendo del lugar de dnde se surte la receta y alguna farmacias pueden ofrecer precios ms baratos.  El sitio web www.goodrx.com tiene cupones para medicamentos de diferentes farmacias. Los precios aqu no tienen en cuenta lo que podra costar con la ayuda del seguro (puede ser ms barato con su seguro), pero el sitio web puede darle el precio si no utiliz ningn seguro.  - Puede imprimir el cupn correspondiente y llevarlo con su receta a la farmacia.  - Tambin puede pasar por nuestra oficina durante el horario de atencin regular y recoger una tarjeta de cupones de GoodRx.  -   Si necesita que su receta se enve electrnicamente a una farmacia diferente, informe a nuestra oficina a travs de MyChart de [facility] o por telfono llamando al [phone] y presione la opcin 4.  

## 2023-04-05 NOTE — Progress Notes (Signed)
Follow-Up Visit   Subjective  Todd Rojas is a 82 y.o. male who presents for the following: Skin Cancer Screening and Full Body Skin Exam  The patient presents for Upper Body Skin Exam (UBSE) for skin cancer screening and mole check. The patient has spots, moles and lesions to be evaluated, some may be new or changing and the patient has concerns that these could be cancer.    The following portions of the chart were reviewed this encounter and updated as appropriate: medications, allergies, medical history  Review of Systems:  No other skin or systemic complaints except as noted in HPI or Assessment and Plan.  Objective  Well appearing patient in no apparent distress; mood and affect are within normal limits.  All skin waist up examined. Relevant physical exam findings are noted in the Assessment and Plan.  Scalp x 7, face x 5, right hand x 1 (13) Erythematous thin papules/macules with gritty scale.   Right bicep Erythematous stuck-on, waxy papule or plaque    Assessment & Plan   EPIDERMAL INCLUSION CYST Exam: Subcutaneous nodule at right mid back with history of abscess  Benign-appearing. Exam most consistent with an epidermal inclusion cyst. Discussed that a cyst is a benign growth that can grow over time and sometimes get irritated or inflamed. Recommend observation if it is not bothersome. Discussed option of surgical excision to remove it if it is growing, symptomatic, or other changes noted. Please call for new or changing lesions so they can be evaluated.    AK (actinic keratosis) (13) Scalp x 7, face x 5, right hand x 1  ACTINIC DAMAGE WITH PRECANCEROUS ACTINIC KERATOSES Counseling for Topical Chemotherapy Management: Patient exhibits: - Severe, confluent actinic changes with pre-cancerous actinic keratoses that is secondary to cumulative UV radiation exposure over time - Condition that is severe; chronic, not at goal. - diffuse scaly erythematous  macules and papules with underlying dyspigmentation - Discussed Prescription "Field Treatment" topical Chemotherapy for Severe, Chronic Confluent Actinic Changes with Pre-Cancerous Actinic Keratoses Field treatment involves treatment of an entire area of skin that has confluent Actinic Changes (Sun/ Ultraviolet light damage) and PreCancerous Actinic Keratoses by method of PhotoDynamic Therapy (PDT) and/or prescription Topical Chemotherapy agents such as 5-fluorouracil, 5-fluorouracil/calcipotriene, and/or imiquimod.  The purpose is to decrease the number of clinically evident and subclinical PreCancerous lesions to prevent progression to development of skin cancer by chemically destroying early precancer changes that may or may not be visible.  It has been shown to reduce the risk of developing skin cancer in the treated area. As a result of treatment, redness, scaling, crusting, and open sores may occur during treatment course. One or more than one of these methods may be used and may have to be used several times to control, suppress and eliminate the PreCancerous changes. Discussed treatment course, expected reaction, and possible side effects. - Recommend daily broad spectrum sunscreen SPF 30+ to sun-exposed areas, reapply every 2 hours as needed.  - Staying in the shade or wearing long sleeves, sun glasses (UVA+UVB protection) and wide brim hats (4-inch brim around the entire circumference of the hat) are also recommended. - Call for new or changing lesions. - May consider on follow up  Destruction of lesion - Scalp x 7, face x 5, right hand x 1  Inflamed seborrheic keratosis Right bicep  Destruction of lesion - Right bicep Complexity: simple   Destruction method: cryotherapy   Informed consent: discussed and consent obtained   Timeout:  patient name, date of birth, surgical site, and procedure verified Lesion destroyed using liquid nitrogen: Yes   Region frozen until ice ball extended beyond  lesion: Yes   Outcome: patient tolerated procedure well with no complications   Post-procedure details: wound care instructions given     Lentigines, Seborrheic Keratoses, Hemangiomas - Benign normal skin lesions - Benign-appearing - Call for any changes  Melanocytic Nevi - Tan-brown and/or pink-flesh-colored symmetric macules and papules - Benign appearing on exam today - Observation - Call clinic for new or changing moles - Recommend daily use of broad spectrum spf 30+ sunscreen to sun-exposed areas.   Actinic Damage - Chronic condition, secondary to cumulative UV/sun exposure - diffuse scaly erythematous macules with underlying dyspigmentation - Recommend daily broad spectrum sunscreen SPF 30+ to sun-exposed areas, reapply every 2 hours as needed.  - Staying in the shade or wearing long sleeves, sun glasses (UVA+UVB protection) and wide brim hats (4-inch brim around the entire circumference of the hat) are also recommended for sun protection.  - Call for new or changing lesions.  Skin cancer screening performed today.  Return in about 6 months (around 10/05/2023) for AK follow up.  I, Joanie Coddington, CMA, am acting as scribe for Armida Sans, MD .   Documentation: I have reviewed the above documentation for accuracy and completeness, and I agree with the above.  Armida Sans, MD

## 2023-04-17 ENCOUNTER — Encounter: Payer: Self-pay | Admitting: Dermatology

## 2023-05-17 ENCOUNTER — Other Ambulatory Visit: Payer: Self-pay | Admitting: Pulmonary Disease

## 2023-05-17 DIAGNOSIS — R911 Solitary pulmonary nodule: Secondary | ICD-10-CM

## 2023-05-17 DIAGNOSIS — J449 Chronic obstructive pulmonary disease, unspecified: Secondary | ICD-10-CM

## 2023-05-23 ENCOUNTER — Ambulatory Visit
Admission: RE | Admit: 2023-05-23 | Discharge: 2023-05-23 | Disposition: A | Discharge status: Home / Self Care | Payer: Medicare HMO | Source: Ambulatory Visit | Attending: Pulmonary Disease | Admitting: Pulmonary Disease

## 2023-05-23 DIAGNOSIS — J449 Chronic obstructive pulmonary disease, unspecified: Secondary | ICD-10-CM

## 2023-05-23 DIAGNOSIS — R911 Solitary pulmonary nodule: Secondary | ICD-10-CM | POA: Diagnosis present

## 2023-10-04 ENCOUNTER — Ambulatory Visit: Payer: Medicare HMO | Admitting: Dermatology

## 2023-10-04 VITALS — BP 149/70 | HR 63

## 2023-10-04 DIAGNOSIS — Z1283 Encounter for screening for malignant neoplasm of skin: Secondary | ICD-10-CM

## 2023-10-04 DIAGNOSIS — L814 Other melanin hyperpigmentation: Secondary | ICD-10-CM

## 2023-10-04 DIAGNOSIS — L578 Other skin changes due to chronic exposure to nonionizing radiation: Secondary | ICD-10-CM | POA: Diagnosis not present

## 2023-10-04 DIAGNOSIS — L821 Other seborrheic keratosis: Secondary | ICD-10-CM

## 2023-10-04 DIAGNOSIS — D692 Other nonthrombocytopenic purpura: Secondary | ICD-10-CM

## 2023-10-04 DIAGNOSIS — W908XXA Exposure to other nonionizing radiation, initial encounter: Secondary | ICD-10-CM | POA: Diagnosis not present

## 2023-10-04 DIAGNOSIS — Z5111 Encounter for antineoplastic chemotherapy: Secondary | ICD-10-CM

## 2023-10-04 DIAGNOSIS — L570 Actinic keratosis: Secondary | ICD-10-CM

## 2023-10-04 DIAGNOSIS — L820 Inflamed seborrheic keratosis: Secondary | ICD-10-CM | POA: Diagnosis not present

## 2023-10-04 DIAGNOSIS — Z79899 Other long term (current) drug therapy: Secondary | ICD-10-CM

## 2023-10-04 DIAGNOSIS — Z7189 Other specified counseling: Secondary | ICD-10-CM

## 2023-10-04 DIAGNOSIS — D1801 Hemangioma of skin and subcutaneous tissue: Secondary | ICD-10-CM

## 2023-10-04 NOTE — Patient Instructions (Signed)

## 2023-10-04 NOTE — Progress Notes (Deleted)
   Follow-Up Visit   Subjective  Todd Rojas is a 82 y.o. male who presents for the following: *** The patient has spots, moles and lesions to be evaluated, some may be new or changing and the patient may have concern these could be cancer.   The following portions of the chart were reviewed this encounter and updated as appropriate: medications, allergies, medical history  Review of Systems:  No other skin or systemic complaints except as noted in HPI or Assessment and Plan.  Objective  Well appearing patient in no apparent distress; mood and affect are within normal limits.  ***A full examination was performed including scalp, head, eyes, ears, nose, lips, neck, chest, axillae, abdomen, back, buttocks, bilateral upper extremities, bilateral lower extremities, hands, feet, fingers, toes, fingernails, and toenails. All findings within normal limits unless otherwise noted below.  ***A focused examination was performed of the following areas: ***  Relevant exam findings are noted in the Assessment and Plan.    Assessment & Plan   ACTINIC DAMAGE - chronic, secondary to cumulative UV radiation exposure/sun exposure over time - diffuse scaly erythematous macules with underlying dyspigmentation - Recommend daily broad spectrum sunscreen SPF 30+ to sun-exposed areas, reapply every 2 hours as needed.  - Recommend staying in the shade or wearing long sleeves, sun glasses (UVA+UVB protection) and wide brim hats (4-inch brim around the entire circumference of the hat). - Call for new or changing lesions.  LENTIGINES Exam: scattered tan macules Due to sun exposure Treatment Plan: Benign-appearing, observe. Recommend daily broad spectrum sunscreen SPF 30+ to sun-exposed areas, reapply every 2 hours as needed.  Call for any changes  SEBORRHEIC KERATOSIS - Stuck-on, waxy, tan-brown papules and/or plaques  - Benign-appearing - Discussed benign etiology and prognosis. - Observe -  Call for any changes    No follow-ups on file.  Maylene Roes, CMA, am acting as scribe for Armida Sans, MD .   Documentation: I have reviewed the above documentation for accuracy and completeness, and I agree with the above.  Armida Sans, MD

## 2023-10-04 NOTE — Progress Notes (Signed)
Follow-Up Visit   Subjective  Todd Rojas is a 82 y.o. male who presents for the following: Skin Cancer Screening and Full Body Skin Exam  The patient presents for Total-Body Skin Exam (TBSE) for skin cancer screening and mole check. The patient has spots, moles and lesions to be evaluated, some may be new or changing and the patient may have concern these could be cancer.  The following portions of the chart were reviewed this encounter and updated as appropriate: medications, allergies, medical history  Review of Systems:  No other skin or systemic complaints except as noted in HPI or Assessment and Plan.  Objective  Well appearing patient in no apparent distress; mood and affect are within normal limits.  A full examination was performed including scalp, head, eyes, ears, nose, lips, neck, chest, axillae, abdomen, back, bilateral upper extremities, bilateral lower extremities, hands, feet, fingers, toes, fingernails, and toenails. All findings within normal limits unless otherwise noted below.   Relevant physical exam findings are noted in the Assessment and Plan.  Face and ears x 10, scalp x 6 (16) Erythematous thin papules/macules with gritty scale.   Arms x 2 (2) Erythematous stuck-on, waxy papule or plaque    Assessment & Plan   SKIN CANCER SCREENING PERFORMED TODAY.  ACTINIC DAMAGE - Chronic condition, secondary to cumulative UV/sun exposure - diffuse scaly erythematous macules with underlying dyspigmentation - Recommend daily broad spectrum sunscreen SPF 30+ to sun-exposed areas, reapply every 2 hours as needed.  - Staying in the shade or wearing long sleeves, sun glasses (UVA+UVB protection) and wide brim hats (4-inch brim around the entire circumference of the hat) are also recommended for sun protection.  - Call for new or changing lesions.  LENTIGINES, SEBORRHEIC KERATOSES, HEMANGIOMAS - Benign normal skin lesions - Benign-appearing - Call for any  changes  MELANOCYTIC NEVI - Tan-brown and/or pink-flesh-colored symmetric macules and papules - Benign appearing on exam today - Observation - Call clinic for new or changing moles - Recommend daily use of broad spectrum spf 30+ sunscreen to sun-exposed areas.   Purpura - Chronic; persistent and recurrent.  Treatable, but not curable. - Violaceous macules and patches - Benign - Related to trauma, age, sun damage and/or use of blood thinners, chronic use of topical and/or oral steroids - Observe - Can use OTC arnica containing moisturizer such as Dermend Bruise Formula if desired - Call for worsening or other concerns  AK (actinic keratosis) (16) Face and ears x 10, scalp x 6  ACTINIC DAMAGE WITH PRECANCEROUS ACTINIC KERATOSES Counseling for Topical Chemotherapy Management: Patient exhibits: - Severe, confluent actinic changes with pre-cancerous actinic keratoses that is secondary to cumulative UV radiation exposure over time - Condition that is severe; chronic, not at goal. - diffuse scaly erythematous macules and papules with underlying dyspigmentation - Discussed Prescription "Field Treatment" topical Chemotherapy for Severe, Chronic Confluent Actinic Changes with Pre-Cancerous Actinic Keratoses Field treatment involves treatment of an entire area of skin that has confluent Actinic Changes (Sun/ Ultraviolet light damage) and PreCancerous Actinic Keratoses by method of PhotoDynamic Therapy (PDT) and/or prescription Topical Chemotherapy agents such as 5-fluorouracil, 5-fluorouracil/calcipotriene, and/or imiquimod.  The purpose is to decrease the number of clinically evident and subclinical PreCancerous lesions to prevent progression to development of skin cancer by chemically destroying early precancer changes that may or may not be visible.  It has been shown to reduce the risk of developing skin cancer in the treated area. As a result of treatment, redness, scaling, crusting,  and open  sores may occur during treatment course. One or more than one of these methods may be used and may have to be used several times to control, suppress and eliminate the PreCancerous changes. Discussed treatment course, expected reaction, and possible side effects. - Recommend daily broad spectrum sunscreen SPF 30+ to sun-exposed areas, reapply every 2 hours as needed.  - Staying in the shade or wearing long sleeves, sun glasses (UVA+UVB protection) and wide brim hats (4-inch brim around the entire circumference of the hat) are also recommended. - Call for new or changing lesions. - May consider on follow up  Destruction of lesion - Face and ears x 10, scalp x 6 (16) Complexity: simple   Destruction method: cryotherapy   Informed consent: discussed and consent obtained   Timeout:  patient name, date of birth, surgical site, and procedure verified Lesion destroyed using liquid nitrogen: Yes   Region frozen until ice ball extended beyond lesion: Yes   Outcome: patient tolerated procedure well with no complications   Post-procedure details: wound care instructions given    Inflamed seborrheic keratosis (2) Arms x 2  Symptomatic, irritating, patient would like treated.   Destruction of lesion - Arms x 2 (2) Complexity: simple   Destruction method: cryotherapy   Informed consent: discussed and consent obtained   Timeout:  patient name, date of birth, surgical site, and procedure verified Lesion destroyed using liquid nitrogen: Yes   Region frozen until ice ball extended beyond lesion: Yes   Outcome: patient tolerated procedure well with no complications   Post-procedure details: wound care instructions given     Return in about 6 months (around 04/03/2024) for Ak follow up.  Maylene Roes, CMA, am acting as scribe for Armida Sans, MD .   Documentation: I have reviewed the above documentation for accuracy and completeness, and I agree with the above.  Armida Sans, MD

## 2023-10-17 ENCOUNTER — Encounter: Payer: Self-pay | Admitting: Dermatology

## 2024-01-29 IMAGING — CT CT ANGIO CHEST
1 of 2 series · 18 of 30 positions shown · IV contrast (agent unspecified)
Comparison: 07/04/2018

CLINICAL DATA: COPD, shortness of breath

EXAM:
CT ANGIOGRAPHY CHEST WITH CONTRAST
TECHNIQUE: Multidetector CT imaging of the chest was performed using the
standard protocol during bolus administration of intravenous
contrast. Multiplanar CT image reconstructions and MIPs were
obtained to evaluate the vascular anatomy.

[Series 5: pe thins · axial · 0.78mm/px · z∈[-294,-38]mm · 18 of 290 slices shown]
[im 17/290  lung]
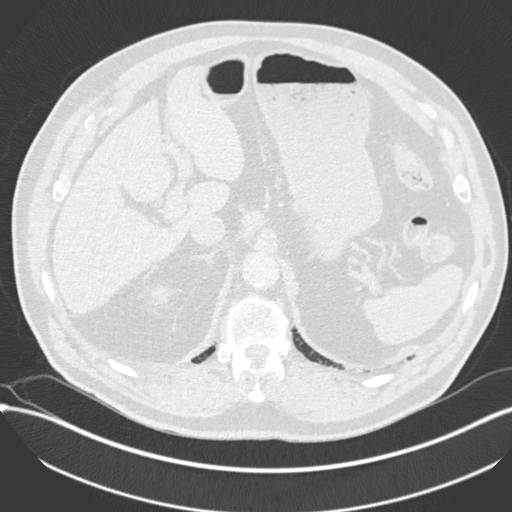
[im 33/290  mediastinal]
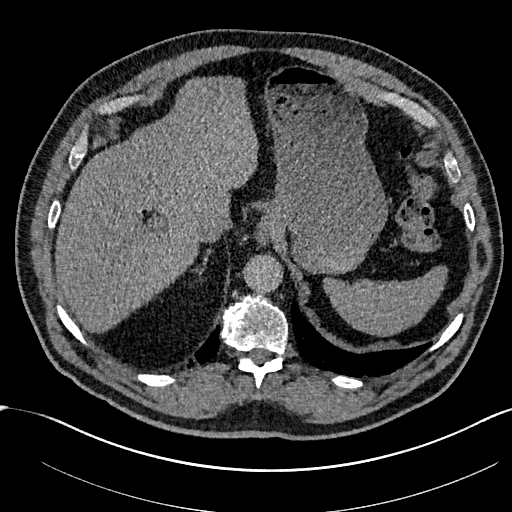
[im 49/290  lung]
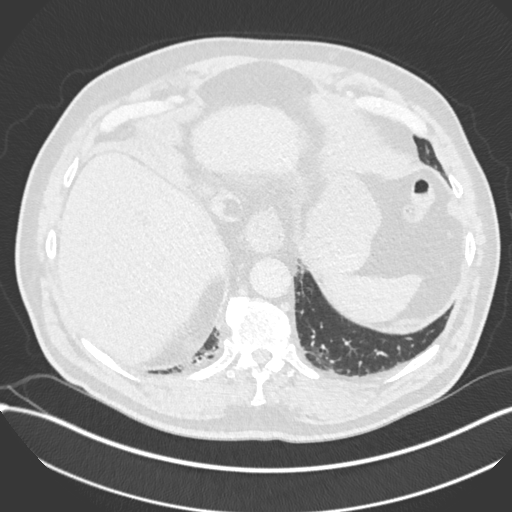
[im 65/290  mediastinal]
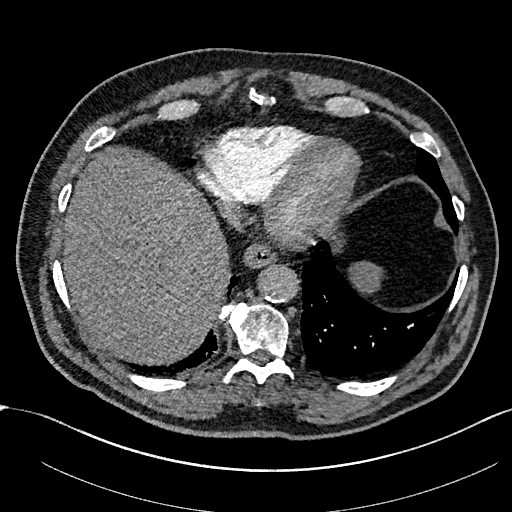
[im 81/290  lung]
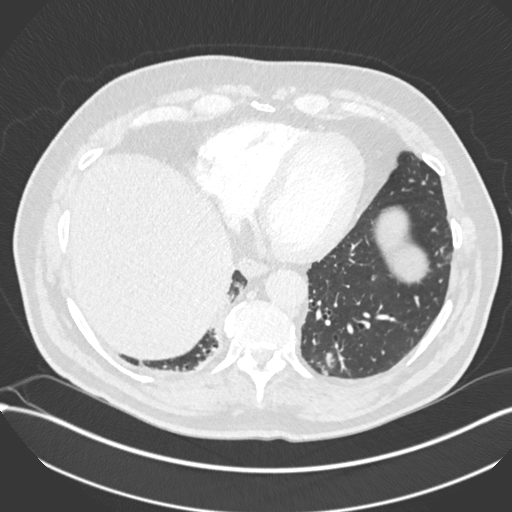
[im 97/290  mediastinal]
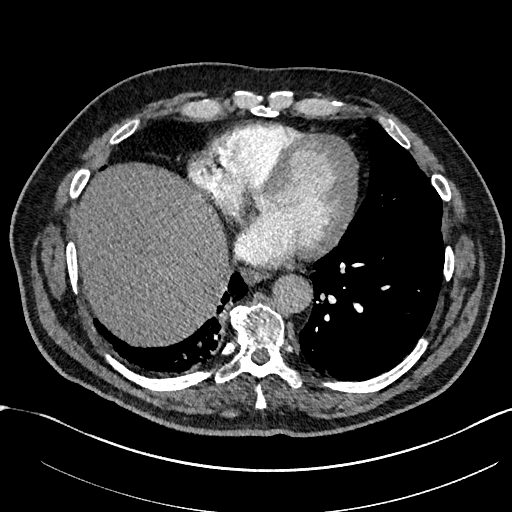
[im 113/290  lung]
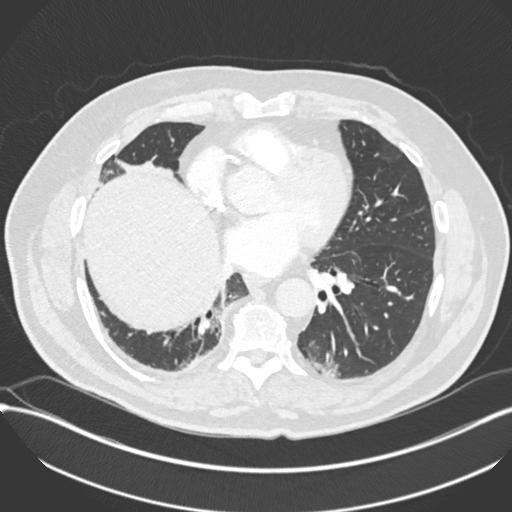
[im 129/290  mediastinal]
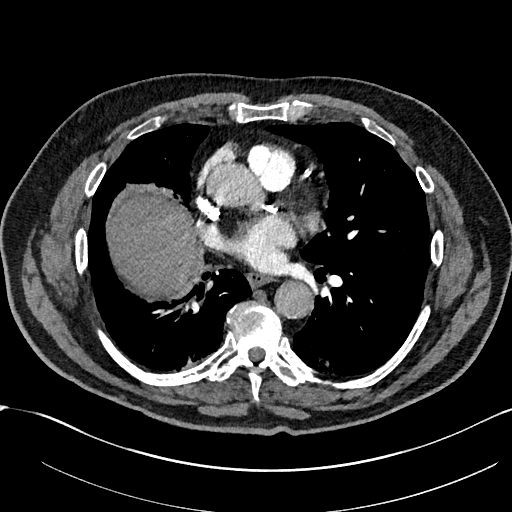
[im 136/290  lung]
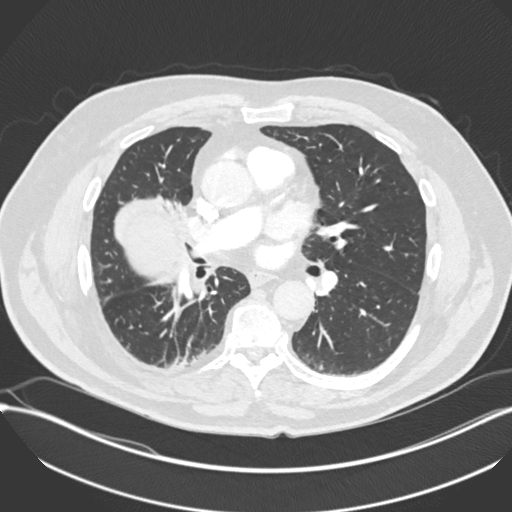
[im 145/290  mediastinal]
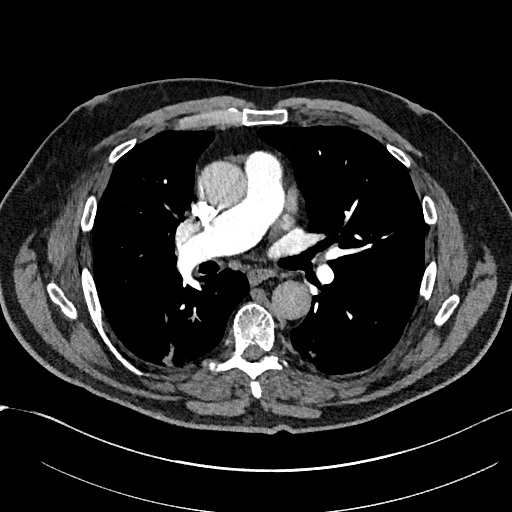
[im 161/290  lung]
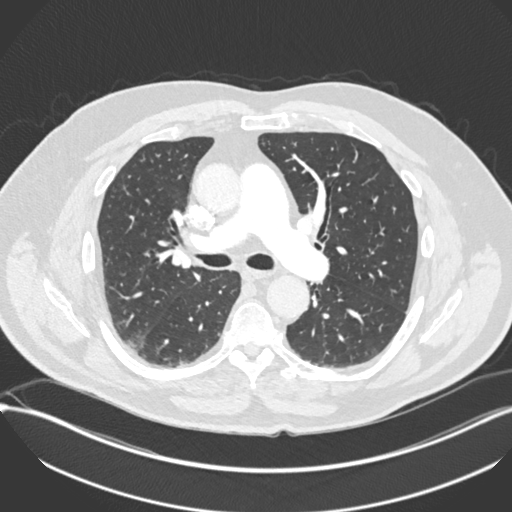
[im 177/290  mediastinal]
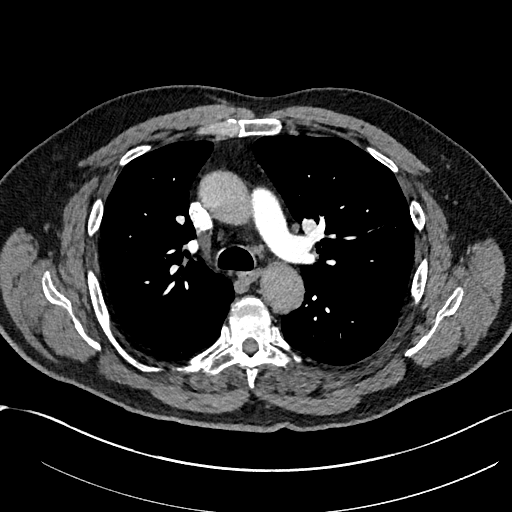
[im 193/290  lung]
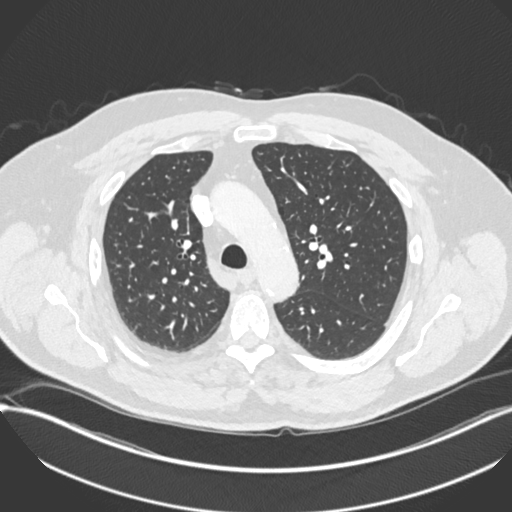
[im 209/290  mediastinal]
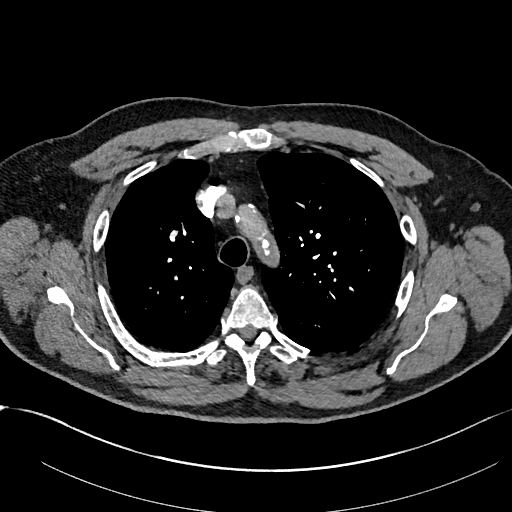
[im 225/290  lung]
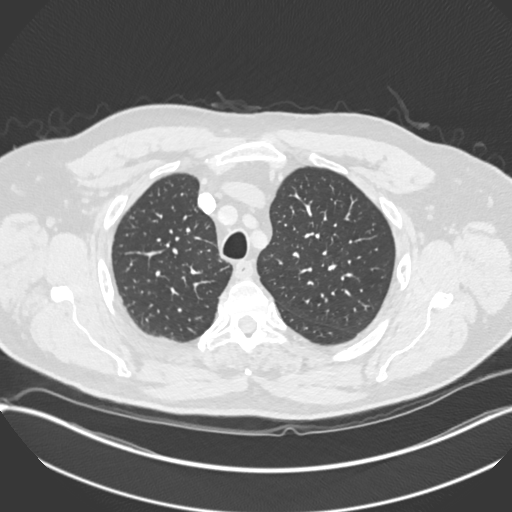
[im 241/290  mediastinal]
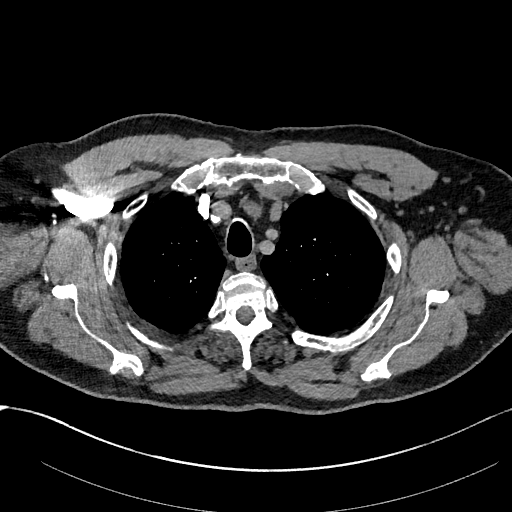
[im 257/290  lung]
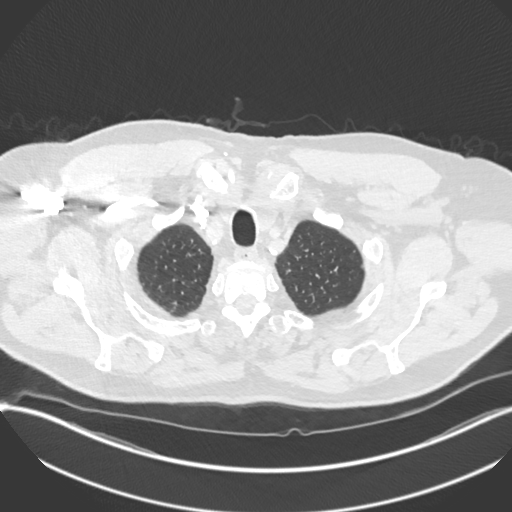
[im 273/290  mediastinal]
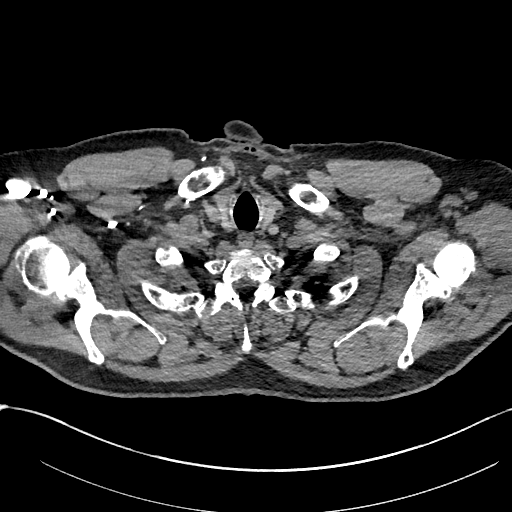

[18 of 30 positions shown; findings below may reference images not displayed]

RADIATION DOSE REDUCTION: This exam was performed according to the
departmental dose-optimization program which includes automated
exposure control, adjustment of the mA and/or kV according to
patient size and/or use of iterative reconstruction technique.

CONTRAST:  75mL OZYFPF-MTS IOPAMIDOL (OZYFPF-MTS) INJECTION 76%
FINDINGS: Cardiovascular: Satisfactory opacification of the pulmonary arteries
to the segmental level. No evidence of pulmonary embolism. Normal
heart size. Three-vessel coronary artery calcifications and stents.
No pericardial effusion. Enlargement of the main pulmonary artery
measuring up to 3.7 cm in caliber. Aortic atherosclerosis

Mediastinum/Nodes: No enlarged mediastinal, hilar, or axillary lymph
nodes. Small hiatal hernia. Thyroid gland, trachea, and esophagus
demonstrate no significant findings.

Lungs/Pleura: Bandlike scarring the bilateral lung bases with
elevation of the right hemidiaphragm, unchanged. Background of very
fine centrilobular pulmonary nodules, concentrated in the lung
apices. No pleural effusion or pneumothorax.

Upper Abdomen: No acute abnormality.

Musculoskeletal: No chest wall abnormality. No acute osseous
findings.

Review of the MIP images confirms the above findings.
IMPRESSION: 1. Negative examination for pulmonary embolism.
2. No acute abnormality of the lungs.
3. Background of very fine centrilobular pulmonary nodules,
concentrated in the lung apices, consistent with smoking-related
respiratory bronchiolitis.
4. Coronary artery disease.
5. Enlargement of the main pulmonary artery, as can be seen in
pulmonary hypertension.

Aortic Atherosclerosis ([id number]).

## 2024-03-06 ENCOUNTER — Encounter: Payer: Self-pay | Admitting: Emergency Medicine

## 2024-03-06 ENCOUNTER — Other Ambulatory Visit: Payer: Self-pay

## 2024-03-06 ENCOUNTER — Emergency Department

## 2024-03-06 ENCOUNTER — Emergency Department
Admission: EM | Admit: 2024-03-06 | Discharge: 2024-03-06 | Disposition: A | Discharge status: Home / Self Care | Source: Home / Self Care | Attending: Student in an Organized Health Care Education/Training Program | Admitting: Student in an Organized Health Care Education/Training Program

## 2024-03-06 DIAGNOSIS — R509 Fever, unspecified: Secondary | ICD-10-CM | POA: Insufficient documentation

## 2024-03-06 DIAGNOSIS — R0602 Shortness of breath: Secondary | ICD-10-CM | POA: Insufficient documentation

## 2024-03-06 DIAGNOSIS — E871 Hypo-osmolality and hyponatremia: Secondary | ICD-10-CM | POA: Diagnosis not present

## 2024-03-06 DIAGNOSIS — A419 Sepsis, unspecified organism: Secondary | ICD-10-CM | POA: Diagnosis not present

## 2024-03-06 LAB — COMPREHENSIVE METABOLIC PANEL
ALT: 19 U/L (ref 0–44)
AST: 17 U/L (ref 15–41)
Albumin: 3.2 g/dL — ABNORMAL LOW (ref 3.5–5.0)
Alkaline Phosphatase: 45 U/L (ref 38–126)
Anion gap: 6 (ref 5–15)
BUN: 24 mg/dL — ABNORMAL HIGH (ref 8–23)
CO2: 26 mmol/L (ref 22–32)
Calcium: 8.6 mg/dL — ABNORMAL LOW (ref 8.9–10.3)
Chloride: 105 mmol/L (ref 98–111)
Creatinine, Ser: 1.36 mg/dL — ABNORMAL HIGH (ref 0.61–1.24)
GFR, Estimated: 52 mL/min — ABNORMAL LOW (ref >60)
Glucose, Bld: 126 mg/dL — ABNORMAL HIGH (ref 70–99)
Potassium: 3.8 mmol/L (ref 3.5–5.1)
Sodium: 137 mmol/L (ref 135–145)
Total Bilirubin: 0.8 mg/dL (ref 0.0–1.2)
Total Protein: 6.8 g/dL (ref 6.5–8.1)

## 2024-03-06 LAB — URINALYSIS, W/ REFLEX TO CULTURE (INFECTION SUSPECTED)
Bilirubin Urine: NEGATIVE
Glucose, UA: NEGATIVE mg/dL
Hgb urine dipstick: NEGATIVE
Ketones, ur: NEGATIVE mg/dL
Leukocytes,Ua: NEGATIVE
Nitrite: NEGATIVE
Protein, ur: NEGATIVE mg/dL
Specific Gravity, Urine: 1.023 (ref 1.005–1.030)
pH: 5 (ref 5.0–8.0)

## 2024-03-06 LAB — CBC WITH DIFFERENTIAL/PLATELET
Abs Immature Granulocytes: 0.11 10*3/uL — ABNORMAL HIGH (ref 0.00–0.07)
Basophils Absolute: 0.1 10*3/uL (ref 0.0–0.1)
Basophils Relative: 1 %
Eosinophils Absolute: 0.4 10*3/uL (ref 0.0–0.5)
Eosinophils Relative: 5 %
HCT: 39.2 % (ref 39.0–52.0)
Hemoglobin: 12.9 g/dL — ABNORMAL LOW (ref 13.0–17.0)
Immature Granulocytes: 2 %
Lymphocytes Relative: 12 %
Lymphs Abs: 0.8 10*3/uL (ref 0.7–4.0)
MCH: 32.7 pg (ref 26.0–34.0)
MCHC: 32.9 g/dL (ref 30.0–36.0)
MCV: 99.2 fL (ref 80.0–100.0)
Monocytes Absolute: 0.3 10*3/uL (ref 0.1–1.0)
Monocytes Relative: 4 %
Neutro Abs: 4.9 10*3/uL (ref 1.7–7.7)
Neutrophils Relative %: 76 %
Platelets: 172 10*3/uL (ref 150–400)
RBC: 3.95 MIL/uL — ABNORMAL LOW (ref 4.22–5.81)
RDW: 13.2 % (ref 11.5–15.5)
WBC: 6.4 10*3/uL (ref 4.0–10.5)
nRBC: 0.3 % — ABNORMAL HIGH (ref 0.0–0.2)

## 2024-03-06 LAB — PROTIME-INR
INR: 1.1 (ref 0.8–1.2)
Prothrombin Time: 14.8 s (ref 11.4–15.2)

## 2024-03-06 LAB — RESP PANEL BY RT-PCR (RSV, FLU A&B, COVID)  RVPGX2
Influenza A by PCR: NEGATIVE
Influenza B by PCR: NEGATIVE
Resp Syncytial Virus by PCR: NEGATIVE
SARS Coronavirus 2 by RT PCR: NEGATIVE

## 2024-03-06 LAB — LACTIC ACID, PLASMA
Lactic Acid, Venous: 1.6 mmol/L (ref 0.5–1.9)
Lactic Acid, Venous: 1.5 mmol/L (ref 0.5–1.9)

## 2024-03-06 MED ORDER — ACETAMINOPHEN 325 MG PO TABS
650.0000 mg | ORAL_TABLET | Freq: Once | ORAL | Status: AC | PRN
  Administered 2024-03-06: 650 mg via ORAL
  Filled 2024-03-06: qty 2

## 2024-03-06 NOTE — ED Provider Notes (Signed)
 Rest of patient's work up is reassuring. Repeat lactic normal. CXR and UA without findings concerning for infection. Discussed this with the patient. At this time somewhat unclear source of the patient's fever but no lab testing concerning for infection. Did discuss that blood cultures were pending. At this time I think it is reasonable for patient to be discharged home. Already has an appointment scheduled with PCP for tomorrow. Discussed returning to the ED for any new or worsening symptoms.    Phineas Semen, MD 03/06/24 [phone]

## 2024-03-06 NOTE — ED Triage Notes (Signed)
 Chills, Fever, SOB x 2 weeks. Has been to urgent care 2 times and was given an antibiotic for infection of unknown cause. Pt fever and SOB worse today.

## 2024-03-06 NOTE — ED Provider Notes (Signed)
 [facility] Provider Note    Event Date/Time   First MD Initiated Contact with Patient 03/06/24 1402     (approximate)   History   Fever and Shortness of Breath   HPI  Todd Rojas is a 83 y.o. male presents to the ER for evaluation of persistent fevers particular at night over the past few days.  Has been on a couple rounds of antibiotics without improvement.  Does feel some shortness of breath.  Denies any chest pain.  No abdominal pain.  No numbness or tingling.  No nausea or vomiting.  No rashes.     Physical Exam   Triage Vital Signs: ED Triage Vitals  Encounter Vitals Group     BP 03/06/24 1348 112/61     Systolic BP Percentile --      Diastolic BP Percentile --      Pulse Rate 03/06/24 1348 (!) 116     Resp 03/06/24 1348 20     Temp 03/06/24 1348 (!) 103.1 F (39.5 C)     Temp Source 03/06/24 1348 Oral     SpO2 03/06/24 1348 93 %     Weight 03/06/24 1355 199 lb 15.3 oz (90.7 kg)     Height 03/06/24 1355 5\' 9"  (1.753 m)     Head Circumference --      Peak Flow --      Pain Score 03/06/24 1355 0     Pain Loc --      Pain Education --      Exclude from Growth Chart --     Most recent vital signs: Vitals:   03/06/24 1359 03/06/24 1447  BP:    Pulse:    Resp: (!) 26   Temp:  99.8 F (37.7 C)  SpO2:       Constitutional: Alert  Eyes: Conjunctivae are normal.  Head: Atraumatic. Nose: No congestion/rhinnorhea. Mouth/Throat: Mucous membranes are moist.   Neck: Painless ROM.  Cardiovascular:   Good peripheral circulation. Respiratory: Normal respiratory effort.  No retractions.  Gastrointestinal: Soft and nontender.  Musculoskeletal:  no deformity Neurologic:  MAE spontaneously. No gross focal neurologic deficits are appreciated.  Skin:  Skin is warm, dry and intact. No rash noted. Psychiatric: Mood and affect are normal. Speech and behavior are normal.    ED Results / Procedures / Treatments   Labs (all labs  ordered are listed, but only abnormal results are displayed) Labs Reviewed  COMPREHENSIVE METABOLIC PANEL - Abnormal; Notable for the following components:      Result Value   Glucose, Bld 126 (*)    BUN 24 (*)    Creatinine, Ser 1.36 (*)    Calcium 8.6 (*)    Albumin 3.2 (*)    GFR, Estimated 52 (*)    All other components within normal limits  CBC WITH DIFFERENTIAL/PLATELET - Abnormal; Notable for the following components:   RBC 3.95 (*)    Hemoglobin 12.9 (*)    nRBC 0.3 (*)    Abs Immature Granulocytes 0.11 (*)    All other components within normal limits  RESP PANEL BY RT-PCR (RSV, FLU A&B, COVID)  [id number]  CULTURE, BLOOD (ROUTINE X 2)  CULTURE, BLOOD (ROUTINE X 2)  LACTIC ACID, PLASMA  PROTIME-INR  LACTIC ACID, PLASMA  URINALYSIS, W/ REFLEX TO CULTURE (INFECTION SUSPECTED)     EKG  ED ECG REPORT I, Willy Eddy, the attending physician, personally viewed and interpreted this ECG.   Date: 03/06/2024  EKG  Time: 13:56  Rate: 110  Rhythm: sinus  Axis: normal  Intervals: normal  ST&T Change: no stemi, no depressions    RADIOLOGY Please see ED Course for my review and interpretation.  I personally reviewed all radiographic images ordered to evaluate for the above acute complaints and reviewed radiology reports and findings.  These findings were personally discussed with the patient.  Please see medical record for radiology report.    PROCEDURES:  Critical Care performed: No  Procedures   MEDICATIONS ORDERED IN ED: Medications  acetaminophen (TYLENOL) tablet 650 mg (650 mg Oral Given 03/06/24 1403)     IMPRESSION / MDM / ASSESSMENT AND PLAN / ED COURSE  I reviewed the triage vital signs and the nursing notes.                              Differential diagnosis includes, but is not limited to, Dehydration, sepsis, pna, uti, hypoglycemia, cva, drug effect, withdrawal, encephalitis  Patient presenting to the ER for evaluation of symptoms as described  above.  Based on symptoms, risk factors and considered above differential, this presenting complaint could reflect a potentially life-threatening illness therefore the patient will be placed on continuous pulse oximetry and telemetry for monitoring.  Laboratory evaluation will be sent to evaluate for the above complaints.      Clinical Course as of 03/06/24 1519  Wed Mar 06, 2024  1419 Chest x-ray on my review and interpretation without evidence of pneumothorax. [PR]  1450 Repeat temp downtrending.  Lactate normal.  No leukocytosis. [PR]  1451 Still waiting urinalysis remainder of workup.  Patient be signed out oncoming physician pending follow-up labs and imaging. [PR]    Clinical Course User Index [PR] Willy Eddy, MD     FINAL CLINICAL IMPRESSION(S) / ED DIAGNOSES   Final diagnoses:  Fever, unspecified fever cause     Rx / DC Orders   ED Discharge Orders     None        Note:  This document was prepared using Dragon voice recognition software and may include unintentional dictation errors.    Willy Eddy, MD 03/06/24 [phone]

## 2024-03-06 NOTE — Discharge Instructions (Addendum)
 Please go to your appointment scheduled for tomorrow.

## 2024-03-07 ENCOUNTER — Other Ambulatory Visit: Payer: Self-pay | Admitting: Physician Assistant

## 2024-03-07 ENCOUNTER — Ambulatory Visit
Admission: RE | Admit: 2024-03-07 | Discharge: 2024-03-07 | Disposition: A | Discharge status: Home / Self Care | Source: Ambulatory Visit | Attending: Physician Assistant | Admitting: Physician Assistant

## 2024-03-07 DIAGNOSIS — J449 Chronic obstructive pulmonary disease, unspecified: Secondary | ICD-10-CM | POA: Insufficient documentation

## 2024-03-07 DIAGNOSIS — R5383 Other fatigue: Secondary | ICD-10-CM | POA: Insufficient documentation

## 2024-03-07 DIAGNOSIS — R61 Generalized hyperhidrosis: Secondary | ICD-10-CM | POA: Insufficient documentation

## 2024-03-07 DIAGNOSIS — J471 Bronchiectasis with (acute) exacerbation: Secondary | ICD-10-CM | POA: Insufficient documentation

## 2024-03-07 DIAGNOSIS — J849 Interstitial pulmonary disease, unspecified: Secondary | ICD-10-CM | POA: Insufficient documentation

## 2024-03-07 DIAGNOSIS — R509 Fever, unspecified: Secondary | ICD-10-CM | POA: Insufficient documentation

## 2024-03-07 MED ORDER — IOHEXOL 300 MG/ML  SOLN
75.0000 mL | Freq: Once | INTRAMUSCULAR | Status: AC | PRN
  Administered 2024-03-07: 75 mL via INTRAVENOUS

## 2024-03-09 ENCOUNTER — Emergency Department

## 2024-03-09 ENCOUNTER — Other Ambulatory Visit: Payer: Self-pay

## 2024-03-09 ENCOUNTER — Inpatient Hospital Stay
Admission: EM | Admit: 2024-03-09 | Discharge: 2024-04-09 | DRG: 853 | Disposition: A | Discharge status: Home Health | Attending: Internal Medicine | Admitting: Internal Medicine

## 2024-03-09 ENCOUNTER — Encounter: Payer: Self-pay | Admitting: Emergency Medicine

## 2024-03-09 DIAGNOSIS — E785 Hyperlipidemia, unspecified: Secondary | ICD-10-CM | POA: Diagnosis present

## 2024-03-09 DIAGNOSIS — Z1152 Encounter for screening for COVID-19: Secondary | ICD-10-CM | POA: Diagnosis not present

## 2024-03-09 DIAGNOSIS — M1A9XX0 Chronic gout, unspecified, without tophus (tophi): Secondary | ICD-10-CM | POA: Diagnosis present

## 2024-03-09 DIAGNOSIS — J9601 Acute respiratory failure with hypoxia: Secondary | ICD-10-CM | POA: Diagnosis present

## 2024-03-09 DIAGNOSIS — K9189 Other postprocedural complications and disorders of digestive system: Secondary | ICD-10-CM | POA: Diagnosis not present

## 2024-03-09 DIAGNOSIS — Z7951 Long term (current) use of inhaled steroids: Secondary | ICD-10-CM

## 2024-03-09 DIAGNOSIS — J479 Bronchiectasis, uncomplicated: Secondary | ICD-10-CM | POA: Diagnosis present

## 2024-03-09 DIAGNOSIS — E1165 Type 2 diabetes mellitus with hyperglycemia: Secondary | ICD-10-CM | POA: Diagnosis present

## 2024-03-09 DIAGNOSIS — E041 Nontoxic single thyroid nodule: Secondary | ICD-10-CM

## 2024-03-09 DIAGNOSIS — I2489 Other forms of acute ischemic heart disease: Secondary | ICD-10-CM | POA: Diagnosis present

## 2024-03-09 DIAGNOSIS — E871 Hypo-osmolality and hyponatremia: Secondary | ICD-10-CM | POA: Diagnosis present

## 2024-03-09 DIAGNOSIS — I5022 Chronic systolic (congestive) heart failure: Secondary | ICD-10-CM | POA: Diagnosis present

## 2024-03-09 DIAGNOSIS — N179 Acute kidney failure, unspecified: Principal | ICD-10-CM | POA: Diagnosis present

## 2024-03-09 DIAGNOSIS — Z7982 Long term (current) use of aspirin: Secondary | ICD-10-CM

## 2024-03-09 DIAGNOSIS — J449 Chronic obstructive pulmonary disease, unspecified: Secondary | ICD-10-CM | POA: Diagnosis not present

## 2024-03-09 DIAGNOSIS — R509 Fever, unspecified: Secondary | ICD-10-CM | POA: Diagnosis not present

## 2024-03-09 DIAGNOSIS — A419 Sepsis, unspecified organism: Secondary | ICD-10-CM | POA: Diagnosis present

## 2024-03-09 DIAGNOSIS — J189 Pneumonia, unspecified organism: Secondary | ICD-10-CM | POA: Diagnosis present

## 2024-03-09 DIAGNOSIS — E875 Hyperkalemia: Secondary | ICD-10-CM | POA: Diagnosis not present

## 2024-03-09 DIAGNOSIS — Z66 Do not resuscitate: Secondary | ICD-10-CM | POA: Diagnosis present

## 2024-03-09 DIAGNOSIS — R5082 Postprocedural fever: Secondary | ICD-10-CM

## 2024-03-09 DIAGNOSIS — I2721 Secondary pulmonary arterial hypertension: Secondary | ICD-10-CM | POA: Diagnosis present

## 2024-03-09 DIAGNOSIS — E8721 Acute metabolic acidosis: Secondary | ICD-10-CM | POA: Diagnosis not present

## 2024-03-09 DIAGNOSIS — E669 Obesity, unspecified: Secondary | ICD-10-CM | POA: Diagnosis present

## 2024-03-09 DIAGNOSIS — K5981 Ogilvie syndrome: Secondary | ICD-10-CM | POA: Diagnosis not present

## 2024-03-09 DIAGNOSIS — R6520 Severe sepsis without septic shock: Secondary | ICD-10-CM | POA: Diagnosis not present

## 2024-03-09 DIAGNOSIS — I10 Essential (primary) hypertension: Secondary | ICD-10-CM | POA: Diagnosis present

## 2024-03-09 DIAGNOSIS — M170 Bilateral primary osteoarthritis of knee: Secondary | ICD-10-CM | POA: Diagnosis present

## 2024-03-09 DIAGNOSIS — N1831 Chronic kidney disease, stage 3a: Secondary | ICD-10-CM | POA: Diagnosis present

## 2024-03-09 DIAGNOSIS — I130 Hypertensive heart and chronic kidney disease with heart failure and stage 1 through stage 4 chronic kidney disease, or unspecified chronic kidney disease: Secondary | ICD-10-CM | POA: Diagnosis present

## 2024-03-09 DIAGNOSIS — R21 Rash and other nonspecific skin eruption: Secondary | ICD-10-CM | POA: Diagnosis not present

## 2024-03-09 DIAGNOSIS — Z96653 Presence of artificial knee joint, bilateral: Secondary | ICD-10-CM | POA: Diagnosis present

## 2024-03-09 DIAGNOSIS — Z8739 Personal history of other diseases of the musculoskeletal system and connective tissue: Secondary | ICD-10-CM

## 2024-03-09 DIAGNOSIS — K567 Ileus, unspecified: Secondary | ICD-10-CM | POA: Diagnosis not present

## 2024-03-09 DIAGNOSIS — R7989 Other specified abnormal findings of blood chemistry: Secondary | ICD-10-CM | POA: Diagnosis not present

## 2024-03-09 DIAGNOSIS — J9811 Atelectasis: Secondary | ICD-10-CM | POA: Diagnosis not present

## 2024-03-09 DIAGNOSIS — R140 Abdominal distension (gaseous): Secondary | ICD-10-CM

## 2024-03-09 DIAGNOSIS — H9190 Unspecified hearing loss, unspecified ear: Secondary | ICD-10-CM | POA: Diagnosis present

## 2024-03-09 DIAGNOSIS — Z806 Family history of leukemia: Secondary | ICD-10-CM

## 2024-03-09 DIAGNOSIS — E43 Unspecified severe protein-calorie malnutrition: Secondary | ICD-10-CM | POA: Insufficient documentation

## 2024-03-09 DIAGNOSIS — G4700 Insomnia, unspecified: Secondary | ICD-10-CM | POA: Diagnosis present

## 2024-03-09 DIAGNOSIS — K219 Gastro-esophageal reflux disease without esophagitis: Secondary | ICD-10-CM | POA: Diagnosis present

## 2024-03-09 DIAGNOSIS — I2510 Atherosclerotic heart disease of native coronary artery without angina pectoris: Secondary | ICD-10-CM | POA: Diagnosis present

## 2024-03-09 DIAGNOSIS — I214 Non-ST elevation (NSTEMI) myocardial infarction: Secondary | ICD-10-CM | POA: Diagnosis not present

## 2024-03-09 DIAGNOSIS — N402 Nodular prostate without lower urinary tract symptoms: Secondary | ICD-10-CM | POA: Diagnosis present

## 2024-03-09 DIAGNOSIS — Z820 Family history of epilepsy and other diseases of the nervous system: Secondary | ICD-10-CM

## 2024-03-09 DIAGNOSIS — Z85828 Personal history of other malignant neoplasm of skin: Secondary | ICD-10-CM

## 2024-03-09 DIAGNOSIS — R001 Bradycardia, unspecified: Secondary | ICD-10-CM | POA: Diagnosis not present

## 2024-03-09 DIAGNOSIS — I252 Old myocardial infarction: Secondary | ICD-10-CM

## 2024-03-09 DIAGNOSIS — Z955 Presence of coronary angioplasty implant and graft: Secondary | ICD-10-CM

## 2024-03-09 DIAGNOSIS — Z87891 Personal history of nicotine dependence: Secondary | ICD-10-CM | POA: Diagnosis not present

## 2024-03-09 DIAGNOSIS — R079 Chest pain, unspecified: Secondary | ICD-10-CM | POA: Diagnosis not present

## 2024-03-09 DIAGNOSIS — J440 Chronic obstructive pulmonary disease with acute lower respiratory infection: Secondary | ICD-10-CM | POA: Diagnosis present

## 2024-03-09 DIAGNOSIS — K8020 Calculus of gallbladder without cholecystitis without obstruction: Secondary | ICD-10-CM | POA: Diagnosis present

## 2024-03-09 DIAGNOSIS — Z6831 Body mass index (BMI) 31.0-31.9, adult: Secondary | ICD-10-CM

## 2024-03-09 DIAGNOSIS — Z79899 Other long term (current) drug therapy: Secondary | ICD-10-CM

## 2024-03-09 LAB — CBC WITH DIFFERENTIAL/PLATELET
Abs Immature Granulocytes: 0.11 10*3/uL — ABNORMAL HIGH (ref 0.00–0.07)
Basophils Absolute: 0 10*3/uL (ref 0.0–0.1)
Basophils Relative: 0 %
Eosinophils Absolute: 0.2 10*3/uL (ref 0.0–0.5)
Eosinophils Relative: 3 %
HCT: 37.3 % — ABNORMAL LOW (ref 39.0–52.0)
Hemoglobin: 12.4 g/dL — ABNORMAL LOW (ref 13.0–17.0)
Immature Granulocytes: 2 %
Lymphocytes Relative: 6 %
Lymphs Abs: 0.4 10*3/uL — ABNORMAL LOW (ref 0.7–4.0)
MCH: 32.6 pg (ref 26.0–34.0)
MCHC: 33.2 g/dL (ref 30.0–36.0)
MCV: 98.2 fL (ref 80.0–100.0)
Monocytes Absolute: 0.2 10*3/uL (ref 0.1–1.0)
Monocytes Relative: 3 %
Neutro Abs: 6.2 10*3/uL (ref 1.7–7.7)
Neutrophils Relative %: 86 %
Platelets: 191 10*3/uL (ref 150–400)
RBC: 3.8 MIL/uL — ABNORMAL LOW (ref 4.22–5.81)
RDW: 13.2 % (ref 11.5–15.5)
WBC: 7.1 10*3/uL (ref 4.0–10.5)
nRBC: 0 % (ref 0.0–0.2)

## 2024-03-09 LAB — COMPREHENSIVE METABOLIC PANEL
ALT: 14 U/L (ref 0–44)
AST: 20 U/L (ref 15–41)
Albumin: 2.7 g/dL — ABNORMAL LOW (ref 3.5–5.0)
Alkaline Phosphatase: 40 U/L (ref 38–126)
Anion gap: 7 (ref 5–15)
BUN: 32 mg/dL — ABNORMAL HIGH (ref 8–23)
CO2: 25 mmol/L (ref 22–32)
Calcium: 8.2 mg/dL — ABNORMAL LOW (ref 8.9–10.3)
Chloride: 99 mmol/L (ref 98–111)
Creatinine, Ser: 1.58 mg/dL — ABNORMAL HIGH (ref 0.61–1.24)
GFR, Estimated: 43 mL/min — ABNORMAL LOW (ref >60)
Glucose, Bld: 131 mg/dL — ABNORMAL HIGH (ref 70–99)
Potassium: 3.7 mmol/L (ref 3.5–5.1)
Sodium: 131 mmol/L — ABNORMAL LOW (ref 135–145)
Total Bilirubin: 0.9 mg/dL (ref 0.0–1.2)
Total Protein: 6.7 g/dL (ref 6.5–8.1)

## 2024-03-09 LAB — BRAIN NATRIURETIC PEPTIDE: B Natriuretic Peptide: 145.6 pg/mL — ABNORMAL HIGH (ref 0.0–100.0)

## 2024-03-09 LAB — TROPONIN I (HIGH SENSITIVITY)
Troponin I (High Sensitivity): 98 ng/L — ABNORMAL HIGH (ref <18)
Troponin I (High Sensitivity): 101 ng/L (ref <18)

## 2024-03-09 LAB — RESPIRATORY PANEL BY PCR

## 2024-03-09 LAB — LACTIC ACID, PLASMA
Lactic Acid, Venous: 2.6 mmol/L (ref 0.5–1.9)
Lactic Acid, Venous: 1.3 mmol/L (ref 0.5–1.9)

## 2024-03-09 LAB — PROCALCITONIN: Procalcitonin: 0.31 ng/mL

## 2024-03-09 LAB — HEPARIN LEVEL (UNFRACTIONATED): Heparin Unfractionated: <0.1 [IU]/mL — ABNORMAL LOW (ref 0.30–0.70)

## 2024-03-09 LAB — APTT: aPTT: 36 s (ref 24–36)

## 2024-03-09 MED ORDER — IOHEXOL 300 MG/ML  SOLN
75.0000 mL | Freq: Once | INTRAMUSCULAR | Status: AC | PRN
  Administered 2024-03-09: 75 mL via INTRAVENOUS

## 2024-03-09 MED ORDER — METRONIDAZOLE 500 MG/100ML IV SOLN: 500.0000 mg | Freq: Two times a day (BID) | INTRAVENOUS | Status: DC

## 2024-03-09 MED ORDER — ACETAMINOPHEN 650 MG RE SUPP: 650.0000 mg | Freq: Four times a day (QID) | RECTAL | Status: AC | PRN

## 2024-03-09 MED ORDER — METRONIDAZOLE 500 MG/100ML IV SOLN
500.0000 mg | Freq: Two times a day (BID) | INTRAVENOUS | Status: DC
  Administered 2024-03-10: 500 mg via INTRAVENOUS
  Filled 2024-03-09: qty 100

## 2024-03-09 MED ORDER — ALUM & MAG HYDROXIDE-SIMETH 200-200-20 MG/5ML PO SUSP
15.0000 mL | ORAL | Status: DC | PRN
  Administered 2024-03-09 – 2024-03-19 (×14): 15 mL via ORAL
  Filled 2024-03-09 (×14): qty 30

## 2024-03-09 MED ORDER — HYDRALAZINE HCL 20 MG/ML IJ SOLN: 5.0000 mg | Freq: Four times a day (QID) | INTRAMUSCULAR | Status: AC | PRN

## 2024-03-09 MED ORDER — ALLOPURINOL 100 MG PO TABS
300.0000 mg | ORAL_TABLET | Freq: Every day | ORAL | Status: DC
  Administered 2024-03-10 – 2024-03-28 (×16): 300 mg via ORAL
  Filled 2024-03-09 (×5): qty 3
  Filled 2024-03-09: qty 1
  Filled 2024-03-09: qty 3
  Filled 2024-03-09: qty 1
  Filled 2024-03-09 (×7): qty 3
  Filled 2024-03-09 (×2): qty 1
  Filled 2024-03-09: qty 3

## 2024-03-09 MED ORDER — ONDANSETRON HCL 4 MG PO TABS
4.0000 mg | ORAL_TABLET | Freq: Four times a day (QID) | ORAL | Status: AC | PRN
  Filled 2024-03-09: qty 1

## 2024-03-09 MED ORDER — SODIUM CHLORIDE 0.9 % IV SOLN
2.0000 g | Freq: Two times a day (BID) | INTRAVENOUS | Status: DC
  Administered 2024-03-10 – 2024-03-13 (×7): 2 g via INTRAVENOUS
  Filled 2024-03-09 (×8): qty 12.5

## 2024-03-09 MED ORDER — MOMETASONE FURO-FORMOTEROL FUM 200-5 MCG/ACT IN AERO
2.0000 | INHALATION_SPRAY | Freq: Two times a day (BID) | RESPIRATORY_TRACT | Status: DC
  Administered 2024-03-10 – 2024-04-09 (×58): 2 via RESPIRATORY_TRACT
  Filled 2024-03-09 (×4): qty 8.8

## 2024-03-09 MED ORDER — HEPARIN (PORCINE) 25000 UT/250ML-% IV SOLN
1600.0000 [IU]/h | INTRAVENOUS | Status: DC
  Administered 2024-03-09: 1400 [IU]/h via INTRAVENOUS
  Administered 2024-03-10 – 2024-03-12 (×3): 1600 [IU]/h via INTRAVENOUS
  Filled 2024-03-09 (×5): qty 250

## 2024-03-09 MED ORDER — SODIUM CHLORIDE 0.9 % IV BOLUS
2000.0000 mL | Freq: Once | INTRAVENOUS | Status: AC
  Administered 2024-03-09: 2000 mL via INTRAVENOUS

## 2024-03-09 MED ORDER — TRAMADOL HCL 50 MG PO TABS
50.0000 mg | ORAL_TABLET | Freq: Once | ORAL | Status: AC
  Administered 2024-03-09: 50 mg via ORAL
  Filled 2024-03-09: qty 1

## 2024-03-09 MED ORDER — HEPARIN BOLUS VIA INFUSION
4000.0000 [IU] | Freq: Once | INTRAVENOUS | Status: AC
  Administered 2024-03-09: 4000 [IU] via INTRAVENOUS
  Filled 2024-03-09: qty 4000

## 2024-03-09 MED ORDER — ASPIRIN 81 MG PO TBEC
81.0000 mg | DELAYED_RELEASE_TABLET | Freq: Every day | ORAL | Status: DC
  Administered 2024-03-09 – 2024-03-28 (×17): 81 mg via ORAL
  Filled 2024-03-09 (×20): qty 1

## 2024-03-09 MED ORDER — ONDANSETRON HCL 4 MG/2ML IJ SOLN
4.0000 mg | Freq: Four times a day (QID) | INTRAMUSCULAR | Status: AC | PRN
  Administered 2024-03-09 – 2024-03-14 (×4): 4 mg via INTRAVENOUS
  Filled 2024-03-09 (×5): qty 2

## 2024-03-09 MED ORDER — SODIUM CHLORIDE 0.9 % IV SOLN
2.0000 g | Freq: Once | INTRAVENOUS | Status: AC
  Administered 2024-03-09: 2 g via INTRAVENOUS
  Filled 2024-03-09: qty 12.5

## 2024-03-09 MED ORDER — VANCOMYCIN HCL IN DEXTROSE 1-5 GM/200ML-% IV SOLN
1000.0000 mg | Freq: Once | INTRAVENOUS | Status: AC
  Administered 2024-03-09: 1000 mg via INTRAVENOUS
  Filled 2024-03-09: qty 200

## 2024-03-09 MED ORDER — HEPARIN SODIUM (PORCINE) 5000 UNIT/ML IJ SOLN: 5000.0000 [IU] | Freq: Three times a day (TID) | INTRAMUSCULAR | Status: DC

## 2024-03-09 MED ORDER — VANCOMYCIN VARIABLE DOSE PER UNSTABLE RENAL FUNCTION (PHARMACIST DOSING): Status: DC

## 2024-03-09 MED ORDER — ACETAMINOPHEN 325 MG PO TABS
650.0000 mg | ORAL_TABLET | Freq: Four times a day (QID) | ORAL | Status: AC | PRN
  Administered 2024-03-10 (×2): 650 mg via ORAL
  Filled 2024-03-09 (×2): qty 2

## 2024-03-09 MED ORDER — NITROGLYCERIN 0.4 MG SL SUBL
0.4000 mg | SUBLINGUAL_TABLET | SUBLINGUAL | Status: DC | PRN
Start: 2024-03-09 — End: 2024-04-09

## 2024-03-09 MED ORDER — VITAMIN B-12 1000 MCG PO TABS
1000.0000 ug | ORAL_TABLET | Freq: Every day | ORAL | Status: DC
  Administered 2024-03-10 – 2024-03-19 (×10): 1000 ug via ORAL
  Filled 2024-03-09 (×8): qty 1
  Filled 2024-03-09: qty 2
  Filled 2024-03-09: qty 1

## 2024-03-09 MED ORDER — SODIUM CHLORIDE 0.9 % IV BOLUS (SEPSIS): 500.0000 mL | Freq: Once | INTRAVENOUS | Status: DC

## 2024-03-09 MED ORDER — ROSUVASTATIN CALCIUM 10 MG PO TABS
20.0000 mg | ORAL_TABLET | Freq: Every day | ORAL | Status: DC
  Administered 2024-03-09 – 2024-03-18 (×10): 20 mg via ORAL
  Filled 2024-03-09: qty 2
  Filled 2024-03-09: qty 1
  Filled 2024-03-09 (×3): qty 2
  Filled 2024-03-09: qty 4
  Filled 2024-03-09 (×4): qty 2

## 2024-03-09 MED ORDER — METRONIDAZOLE 500 MG/100ML IV SOLN
500.0000 mg | Freq: Once | INTRAVENOUS | Status: AC
  Administered 2024-03-09: 500 mg via INTRAVENOUS
  Filled 2024-03-09: qty 100

## 2024-03-09 MED ORDER — SODIUM CHLORIDE 0.9 % IV BOLUS
1000.0000 mL | Freq: Once | INTRAVENOUS | Status: AC
  Administered 2024-03-09: 1000 mL via INTRAVENOUS

## 2024-03-09 MED ORDER — IPRATROPIUM-ALBUTEROL 0.5-2.5 (3) MG/3ML IN SOLN
3.0000 mL | Freq: Four times a day (QID) | RESPIRATORY_TRACT | Status: AC
  Administered 2024-03-09 – 2024-03-10 (×3): 3 mL via RESPIRATORY_TRACT
  Filled 2024-03-09 (×3): qty 3

## 2024-03-09 MED ORDER — ONDANSETRON HCL 4 MG/2ML IJ SOLN
4.0000 mg | Freq: Once | INTRAMUSCULAR | Status: AC
  Administered 2024-03-09: 4 mg via INTRAVENOUS
  Filled 2024-03-09: qty 2

## 2024-03-09 NOTE — ED Notes (Signed)
 Called CCMD for central monitoring at this time

## 2024-03-09 NOTE — Code Documentation (Signed)
 CODE SEPSIS - PHARMACY COMMUNICATION  **Broad Spectrum Antibiotics should be administered within 1 hour of Sepsis diagnosis**  Time Code Sepsis Called/Page Received: 1153  Antibiotics Ordered: cefepime, metronidazole, vancomycin  Time of 1st antibiotic administration: 1236  Additional action taken by pharmacy: none required  If necessary, Name of Provider/Nurse Contacted: N/A    Lowella Bandy ,PharmD Clinical Pharmacist  03/09/2024  12:17 PM

## 2024-03-09 NOTE — Assessment & Plan Note (Signed)
 Home allopurinol 300 mg daily resumed on admission

## 2024-03-09 NOTE — H&P (Addendum)
 History and Physical   Todd Rojas WJX:[id number] DOB: 1941/11/11 DOA: 03/09/2024  PCP: Barbette Reichmann, MD  Outpatient Specialists: Dr. Karna Christmas, [facility] Patient coming from: Home via POV  I have personally briefly reviewed patient's old medical records in [facility] EMR.  Chief Concern: Fever, chills, shortness of breath for 2 weeks  HPI: Todd Rojas is an 83 year old male with history of CKD stage IIIa, chronic gout, hypertension, hyperlipidemia, interstitial pulmonary disease, bronchiectasis, COPD, who presents emergency department for chief concerns of fever, chills, shortness of breath for 2 weeks.  Vitals in the ED showed temperature of 98.4, respiration rate 25, heart rate 94, blood pressure initially 96/69 and improved to 107/66, SpO2 initially desatted to 88% on room air and improved to 98% on 2 L nasal cannula.  Serum sodium is 131, potassium 3.7, chloride 99, bicarb 25, BUN of 32, serum creatinine 1.58, EGFR 43, nonfasting blood glucose 131, WBC 7.1, hemoglobin 12.4, platelets of 191.  BNP was elevated at 145.6.  High sensitive troponin is 98.  Lactic acid 2.6 and on repeat was 1.3.  Procalcitonin was elevated at 0.31.  ED treatment: Ondansetron 4 mg IV one-time dose, cefepime 2 g IV, metronidazole 500 mg IV one-time dose, vancomycin, sodium chloride 1 L bolus. ------------------------------------- At bedside, patient patient was able to tell me his first and last name, age, location, current, reviewed.  He reports that over the past 2 weeks, he has been having shortness of breath, fever, on and off chills and some chest discomfort. Today he had a fever of 102 and he took an ibuprofen, 1 tablet, at home. Patient has been alternating between ibuprofen and acetaminophen at home. He denies cough. He endorses swelling of his knees.  Patient had nausea and vomiting yesterday and this morning.  He has been vomiting up yellow sputum color.  He denies syncope,  loss of consciousness.  He denies trauma to his person. He denies dysuria, hematuria, diarrhea, blood in his stool.  Social history: He lives at home with his wife.  He denies tobacco, EtOH, recreational drug use.  He is retired.  ROS: Constitutional: no weight change, + fever ENT/Mouth: no sore throat, no rhinorrhea Eyes: no eye pain, no vision changes Cardiovascular: + chest pain, + dyspnea,  no edema, no palpitations Respiratory: no cough, no sputum, no wheezing Gastrointestinal: no nausea, no vomiting, no diarrhea, no constipation Genitourinary: no urinary incontinence, no dysuria, no hematuria Musculoskeletal: no arthralgias, no myalgias Skin: no skin lesions, no pruritus, Neuro: + weakness, no loss of consciousness, no syncope Psych: no anxiety, no depression, + decrease appetite Heme/Lymph: no bruising, no bleeding  ED Course: Discussed with EDP, patient requiring hospitalization for chief concerns of severe sepsis.  Assessment/Plan  Principal Problem:   Severe sepsis with acute organ dysfunction (HCC) Active Problems:   CAD (coronary artery disease)   COPD (chronic obstructive pulmonary disease) (HCC)   Dyslipidemia   Hyperlipidemia, unspecified   Hypertension   Personal history of gout   Primary osteoarthritis of both knees   Elevated serum creatinine   Elevated troponin   Fever   Assessment and Plan:  * Severe sepsis with acute organ dysfunction (HCC) Elevated lactic acid of 2.6, increased respiration rate, with fever of 102 F at home, organ involvement are cardiac and pulmonary with hypoxic respiratory failure at 88% on room air, elevated high sensitive troponin of 98 and elevated BNP Blood cultures x 2 are in process UA ordered pending collection at the time of this  dictation Initial lactic acid 2.6 and on repeat is 1.3 Cefepime, vancomycin per pharmacy Metronidazole 500 mg IV twice daily on admission ordered 20 pathogen respiratory panel ordered by EDP is in  process at this time  Fever Patient had 1 set of blood cultures done on 3/19 03/2024: Which was read as no growth, 3 days And in setting of elevated high sensitive troponin, shortness of breath, fever, myocarditis cannot be excluded at this time Complete echo ordered on admission  Elevated troponin First high sensitivity troponin was elevated at 98, seconds check is in process at this time Suspect secondary to demand ischemia in setting of severe sepsis with acute hypoxic respiratory failure Complete echo ordered on admission  Elevated serum creatinine Does not meet acute kidney injury, suspect prerenal in setting of nausea vomiting Serum creatinine baseline is 1.35-1.36/eGFR 52-55 Status post sodium chloride 1 L bolus On admission ordered sodium chloride 500 mL liter bolus Recheck BMP in the a.m.  Personal history of gout Home allopurinol 300 mg daily resumed on admission  Hypertension Home furosemide and lisinopril 20 mg daily were not resumed on admission in setting of increase in sCr level from baseline AM team to resume when the benefits outweigh the risk Hydralazine 5 mg IV every 6 hours as needed for SBP greater 165, 5 days ordered  Hyperlipidemia, unspecified Home rosuvastatin 20 mg nightly resumed  COPD (chronic obstructive pulmonary disease) (HCC) DuoNebs 4 times daily, 3 doses ordered on admission Home long-acting inhaler equivalent resumed on admission  CAD (coronary artery disease) With history of anterior STEMI in 2006, transferred to [facility] status post cardiac catheterization with PCI to mid LAD using Cypher stent; currently no longer on Plavix Complete echo on 05/17/2022: Estimated ejection fraction is 55%, trivial mitral regurgitation, trivial tricuspid regurgitation, trivial pulmonary valve regurgitation; aortic valve has normal variant Home aspirin 81 mg daily, rosuvastatin 20 mg nightly were resumed on admission  Chart reviewed.   DVT prophylaxis: Heparin 5000  units subcutaneous every 8 hours Code Status: AND (DNR/DNI) Diet: Heart healthy Family Communication: updated spouse and daughter at bedside with patient's permission Disposition Plan: Pending clinical course Consults called: Pharmacy Admission status: PCU, inpatient  Past Medical History:  Diagnosis Date   Actinic keratosis    Arthritis    Asthma    Basal cell carcinoma 03/17/2020   right ant lat deltoid   CAD (coronary artery disease) 02/13/2012   Overview:   12/2004 - Anginal equivalent symptoms:  Left arm pain, weakness, and shortness of breath.   12/2004:  Anterior STEMI.  Transferred to [facility].   01/06/2005 - Cardiac cath:  EF 51%, an occluded mid LAD and a large OM2 with 75% ostial stenosis.  PCI of the mid occluded LAD using a Cypher stent.   03/2005 - 2D echo:  EF >55%, LAE 4.7 cm, no significant valvular disease.   02/2008:  Plavix stopped.   06/2008:  Recurrent angina - symptoms of chest tightness.  Presented to [facility].  Cardiac catheterization.  The patient stated nonobstructive CAD seen.  Plavix restarted at that time.   03/2009 - 2D echo:  EF 35%, akinetic apex and septum.  Stress echocardiogram deferred for cardiac catheterization.   03/30/2009:  PCI 90% proximal LAD with a 3.0 x 18 mm Xience DES.   02/2010: Slow and gradual return of chest tightness, fatigue and generalized weakness concerning for return of anginal equivalent.  Stress test did not show ischemia to the heart muscle during exercise.  Evidenc  CHF (congestive heart failure) (HCC)    Chronic kidney disease    kidney stone left side   COPD (chronic obstructive pulmonary disease) (HCC) 07/03/2017   Dyslipidemia 02/13/2012   Overview:   03/2009  TC 158, TG 88, HDL 39, LDL 102.  11/2013  TC 148, TG 115, HDL 44.3, LDL 80.7 (done at PCP's office)   10/2014 TC 146  11/2015 TC 123, TG 86, HDL 41.7, LDL 64  11/2016 TC 140, TG 129, HDL 40.1, LDL 74    Dyspnea    GERD (gastroesophageal reflux  disease)    GI disease 03/09/2013   Overview:   Dysphagia - EGD demonstrated esophageal stricture which was dilated   GERD   Hemorrhoids    Hearing loss 03/09/2013   History of kidney stones    Hyperlipidemia, unspecified 07/03/2017   Hypertension 02/13/2012   Myocardial infarction [facility]) 2004   Personal history of gout 12/09/2016   Primary osteoarthritis of both knees 05/05/2015   Right-sided low back pain without sciatica 04/14/2016   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   R ear - ED&C   Past Surgical History:  Procedure Laterality Date   colonoscopy with polypectomy     COLONOSCOPY WITH PROPOFOL N/A 10/08/2018   Procedure: COLONOSCOPY WITH PROPOFOL;  Surgeon: Scot Jun, MD;  Location: [facility] ENDOSCOPY;  Service: Endoscopy;  Laterality: N/A;   COLONOSCOPY WITH PROPOFOL N/A 01/06/2023   Procedure: COLONOSCOPY WITH PROPOFOL;  Surgeon: Regis Bill, MD;  Location: [facility] ENDOSCOPY;  Service: Endoscopy;  Laterality: N/A;   coronary stents     CYSTOSCOPY WITH STENT PLACEMENT Left 08/09/2017   Procedure: CYSTOSCOPY WITH STENT PLACEMENT;  Surgeon: Hildred Laser, MD;  Location: [facility] ORS;  Service: Urology;  Laterality: Left;   MEDIAL PARTIAL KNEE REPLACEMENT Bilateral 2016   URETEROSCOPY WITH HOLMIUM LASER LITHOTRIPSY Left 08/09/2017   Procedure: URETEROSCOPY WITH HOLMIUM LASER LITHOTRIPSY/INCISION OF URETEROCELE;  Surgeon: Hildred Laser, MD;  Location: [facility] ORS;  Service: Urology;  Laterality: Left;   Social History:  reports that he has quit smoking. He quit smokeless tobacco use about 29 years ago.  His smokeless tobacco use included chew. He reports that he does not drink alcohol and does not use drugs.  No Known Allergies Family History  Problem Relation Age of Onset   Alzheimer's disease Mother    Leukemia Father    Prostate cancer Neg Hx    Kidney cancer Neg Hx    Bladder Cancer Neg Hx    Family history: Family history reviewed and not pertinent.  Prior  to Admission medications   Medication Sig Start Date End Date Taking? Authorizing Provider  acetaminophen (TYLENOL) 500 MG tablet Take 1,000 mg by mouth every 8 (eight) hours as needed for mild pain.    [provider]  albuterol (PROVENTIL HFA;VENTOLIN HFA) 108 (90 Base) MCG/ACT inhaler Inhale 1 puff into the lungs every 6 (six) hours as needed for wheezing or shortness of breath.    [provider]  allopurinol (ZYLOPRIM) 300 MG tablet Take 300 mg by mouth daily.  01/31/17   [provider]  amoxicillin (AMOXIL) 500 MG capsule Take by mouth. 05/28/19   [provider]  aspirin EC 81 MG tablet Take 81 mg by mouth daily.    [provider]  budesonide-formoterol (SYMBICORT) 160-4.5 MCG/ACT inhaler Inhale into the lungs. 04/22/21 04/22/22  [provider]  colchicine 0.6 MG tablet Take 0.6 mg by mouth daily. Patient not taking: Reported on 10/04/2023  [provider]  cyanocobalamin (,VITAMIN B-12,) 1000 MCG/ML injection Inject 1,000 mcg into the muscle every 30 (thirty) days.  01/02/17   [provider]  doxycycline (MONODOX) 100 MG capsule Take 1 capsule (100 mg total) by mouth 2 (two) times daily. Take with food 12/28/22   Willeen Niece, MD  furosemide (LASIX) 20 MG tablet Take 20 mg by mouth daily.    [provider]  hydrocortisone 2.5 % cream Apply topically daily. Apply to affected areas at bedtime on Monday, Wednesdays and Fridays. 10/03/22   Deirdre Evener, MD  lisinopril (PRINIVIL,ZESTRIL) 40 MG tablet Take 40 mg by mouth daily.    [provider]  lisinopril (ZESTRIL) 20 MG tablet Take 20 mg by mouth daily. 02/15/21   [provider]  mupirocin ointment (BACTROBAN) 2 % Apply once daily with dressing changes 12/28/22   Willeen Niece, MD  nitroGLYCERIN (NITROSTAT) 0.4 MG SL tablet Place under the tongue. 03/31/20   [provider]  pravastatin (PRAVACHOL) 80 MG tablet Take 80 mg by mouth  every evening.  06/12/17   [provider]  RIVAROXABAN PO     [provider]  UNABLE TO FIND Take 100 mg by mouth daily. Study drug: Aspirin 100 mg  Dr Allena Katz @@ [facility] is monitoring this study    [provider]  VANISHPOINT TUBERCULIN SYRINGE 25G X 1" 1 ML MISC  07/05/17   [provider]  vitamin B-12 (CYANOCOBALAMIN) 1000 MCG tablet Take 1,000 mcg by mouth daily.    [provider]   Physical Exam: Vitals:   03/09/24 1200 03/09/24 1300 03/09/24 1400 03/09/24 1620  BP: 96/69 (!) 106/55 (!) 112/56 (!) 102/54  Pulse: 94 86 91 89  Resp: (!) 25 (!) 27 (!) 25 20  Temp:      TempSrc:      SpO2: 98% 97% 96% 95%  Weight:      Height:       Constitutional: appears frail, weak Eyes: PERRL, lids and conjunctivae normal ENMT: Mucous membranes are dry. Posterior pharynx clear of any exudate or lesions. Age-appropriate dentition.  Bilateral mild to moderate hearing loss Neck: normal, supple, no masses, no thyromegaly Respiratory: clear to auscultation bilaterally, no wheezing, no crackles. Normal respiratory effort. No accessory muscle use.  Cardiovascular: Regular rate and rhythm, no murmurs / rubs / gallops. No extremity edema. 2+ pedal pulses. No carotid bruits.  Abdomen: Morbidly obese abdomen, no tenderness, no masses palpated, no hepatosplenomegaly. Bowel sounds positive.  Musculoskeletal: no clubbing / cyanosis. No joint deformity upper and lower extremities. Good ROM, no contractures, no atrophy. Normal muscle tone.  Skin: no rashes, lesions, ulcers. No induration Neurologic: Sensation intact. Strength 5/5 in all 4.  Psychiatric: Normal judgment and insight. Alert and oriented x 3.  Normal mood with sense of humor  EKG: independently reviewed, showing sinus rhythm with rate of 98, QTc 398  Chest x-ray on Admission: I personally reviewed and I agree with radiologist reading as below.  CT HEAD WO CONTRAST ( ) Result Date: 03/09/2024 CLINICAL  DATA:  Headache EXAM: CT HEAD WITHOUT CONTRAST TECHNIQUE: Contiguous axial images were obtained from the base of the skull through the vertex without intravenous contrast. RADIATION DOSE REDUCTION: This exam was performed according to the departmental dose-optimization program which includes automated exposure control, adjustment of the mA and/or kV according to patient size and/or use of iterative reconstruction technique. COMPARISON:  MRI head 08/09/2007 FINDINGS: Streak artifact limits evaluation likely influenced by patient's arm positioning.  Brain: No acute intracranial hemorrhage. No CT evidence of acute infarct. Nonspecific hypoattenuation in the periventricular and subcortical white matter favored to reflect chronic microvascular ischemic changes. No edema, mass effect, or midline shift. The basilar cisterns are patent. Ventricles: The ventricles are normal. Vascular: Atherosclerotic calcifications of the carotid siphons. No hyperdense vessel. Skull: No acute or aggressive finding. Orbits: Orbits are symmetric. Sinuses: Mild mucosal thickening in the ethmoid sinuses. Other: Mastoid air cells are clear. IMPRESSION: Examination is limited due to streak artifact likely related to positioning of the patient's arms. Within these limitations there is no acute intracranial abnormality appreciated. Mild chronic microvascular ischemic changes. Electronically Signed   By: Emily Filbert M.D.   On: 03/09/2024 15:18   CT ABDOMEN PELVIS W CONTRAST Result Date: 03/09/2024 CLINICAL DATA:  "Abdominal pain" nonlocalized. Chills. Fever. Shortness of breath for 2 weeks. EXAM: CT ABDOMEN AND PELVIS WITH CONTRAST TECHNIQUE: Multidetector CT imaging of the abdomen and pelvis was performed using the standard protocol following bolus administration of intravenous contrast. RADIATION DOSE REDUCTION: This exam was performed according to the departmental dose-optimization program which includes automated exposure control, adjustment  of the mA and/or kV according to patient size and/or use of iterative reconstruction technique. CONTRAST:  75mL OMNIPAQUE IOHEXOL 300 MG/ML  SOLN COMPARISON:  07/17/2017 FINDINGS: Lower chest: Emphysema. Patchy right lower lobe airspace disease. Mild cardiomegaly. Right hemidiaphragm elevation. Three vessel coronary artery calcification. Pulmonary artery enlargement, outflow tract 3.4 cm. Hepatobiliary: Too small to characterize segment 2 low-density lesion is most likely a cyst. Normal gallbladder, without biliary ductal dilatation. Pancreas: Mild motion degradation in the mid abdomen. Fatty replaced pancreatic head and uncinate process. Spleen: Normal in size, without focal abnormality. Adrenals/Urinary Tract: Normal adrenal glands. Lower pole right renal 9 mm cyst. No specific follow-up indicated. Left renal cortical thinning/scarring is mild. No hydronephrosis. Minimal left hydroureter is significantly improved and without acute cause. Presumed contrast throughout the urinary bladder is evidenced by hyperattenuation. Stomach/Bowel: Tiny hiatal hernia. Normal colon, appendix, and terminal ileum. Normal small bowel. Vascular/Lymphatic: Aortic atherosclerosis. Retroaortic left renal vein. No abdominopelvic adenopathy. Reproductive: Moderate prostatomegaly. Right posterior prostatic base 2.5 cm subtle hyperenhancing nodule. Other: No significant free fluid.  No free intraperitoneal air. Musculoskeletal: No acute osseous abnormality. IMPRESSION: 1. Portions of exam are mildly motion degraded. 2. Given this limitation, no acute process in the abdomen or pelvis. 3. Right lower lobe airspace disease is new and could represent infection or atelectasis in the setting of right hemidiaphragm elevation. 4. Subtle hyperenhancing right prostatic nodule. Consider outpatient urology consultation to exclude carcinoma. 5. Hyperattenuation throughout the bladder is most likely early contrast excretion. Correlate with urinalysis to  exclude bladder hemorrhage, felt less likely. 6. Incidental findings, including: Tiny hiatal hernia. Coronary artery atherosclerosis. Aortic Atherosclerosis (ICD10-I70.0). 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 Electronically Signed   By: Jeronimo Greaves M.D.   On: 03/09/2024 14:15   DG Chest 2 View Result Date: 03/09/2024 CLINICAL DATA:  Shortness of breath, fever, chills for 2 weeks. EXAM: CHEST - 2 VIEW COMPARISON:  03/06/2024 FINDINGS: The heart size and mediastinal contours are within normal limits. Stable elevated right hemidiaphragm and right basilar scarring. The lungs are otherwise clear. IMPRESSION: No active cardiopulmonary disease. Electronically Signed   By: Danae Orleans M.D.   On: 03/09/2024 12:48   Labs on Admission: I have personally reviewed following labs  CBC: Recent Labs  Lab 03/06/24 1409 03/09/24 1145  WBC 6.4 7.1  NEUTROABS 4.9 6.2  HGB  12.9* 12.4*  HCT 39.2 37.3*  MCV 99.2 98.2  PLT 172 191   Basic Metabolic Panel: Recent Labs  Lab 03/06/24 1409 03/09/24 1145  NA 137 131*  K 3.8 3.7  CL 105 99  CO2 26 25  GLUCOSE 126* 131*  BUN 24* 32*  CREATININE 1.36* 1.58*  CALCIUM 8.6* 8.2*   GFR: Estimated Creatinine Clearance: 40.8 mL/min (A) (by C-G formula based on SCr of 1.58 mg/dL (H)).  Liver Function Tests: Recent Labs  Lab 03/06/24 1409 03/09/24 1145  AST 17 20  ALT 19 14  ALKPHOS 45 40  BILITOT 0.8 0.9  PROT 6.8 6.7  ALBUMIN 3.2* 2.7*   Coagulation Profile: Recent Labs  Lab 03/06/24 1409  INR 1.1   Urine analysis:    Component Value Date/Time   COLORURINE YELLOW (A) 03/06/2024 1603   APPEARANCEUR HAZY (A) 03/06/2024 1603   APPEARANCEUR Cloudy (A) 08/23/2017 1018   LABSPEC 1.023 03/06/2024 1603   PHURINE 5.0 03/06/2024 1603   GLUCOSEU NEGATIVE 03/06/2024 1603   HGBUR NEGATIVE 03/06/2024 1603   BILIRUBINUR NEGATIVE 03/06/2024 1603   BILIRUBINUR Negative 08/23/2017 1018    KETONESUR NEGATIVE 03/06/2024 1603   PROTEINUR NEGATIVE 03/06/2024 1603   NITRITE NEGATIVE 03/06/2024 1603   LEUKOCYTESUR NEGATIVE 03/06/2024 1603   CRITICAL CARE Performed by: Dr. Sedalia Muta  Total critical care time: 35 minutes  Critical care time was exclusive of separately billable procedures and treating other patients.  Critical care was necessary to treat or prevent imminent or life-threatening deterioration.  Critical care was time spent personally by me on the following activities: development of treatment plan with patient and family (spouse and daughter) as well as nursing, discussions with consultants, evaluation of patient's response to treatment, examination of patient, obtaining history from patient or surrogate, ordering and performing treatments and interventions, ordering and review of laboratory studies, ordering and review of radiographic studies, pulse oximetry and re-evaluation of patient's condition.  This document was prepared using Dragon Voice Recognition software and may include unintentional dictation errors.  Dr. Sedalia Muta [facility]  If 7PM-7AM, please contact overnight-coverage provider If 7AM-7PM, please contact day attending provider [email or web address]  03/09/2024, 4:53 PM

## 2024-03-09 NOTE — ED Notes (Signed)
 Pt going to CT

## 2024-03-09 NOTE — ED Triage Notes (Addendum)
 Pt in via POV, reports ongoing chills, fever, SOB x 2 weeks.  Reports seeing PCP Friday and is currently being treated for possible PNA, just started antibiotics yesterday.  Vitals unremarkable at this time.  Patient spouse also reports swelling to bilateral hands and some recent weight gain; states she did not give him his diuretic today.

## 2024-03-09 NOTE — Assessment & Plan Note (Signed)
 Elevated lactic acid of 2.6, increased respiration rate, with fever of 102 F at home, organ involvement are cardiac and pulmonary with hypoxic respiratory failure at 88% on room air, elevated high sensitive troponin of 98 and elevated BNP Imaging concerning for right lower lobe pneumonia, although not much upper respiratory symptoms.  Procalcitonin at 0.352. Respiratory panels were negative. Preliminary blood cultures negative UA does not consistent with UTI Patient received broad-spectrum antibiotics -Continue cefepime and discontinue vancomycin and Flagyl -Added Zithromax for atypical coverage -Patient will need a repeat CT chest in 3 to 4 weeks to see the resolution of current abnormalities

## 2024-03-09 NOTE — Assessment & Plan Note (Addendum)
 First high sensitivity troponin was elevated at 98, seconds check is in process at this time Suspect secondary to demand ischemia in setting of severe sepsis with acute hypoxic respiratory failure Complete echo ordered on admission

## 2024-03-09 NOTE — Hospital Course (Signed)
 Mr. Rontrell Moquin is an 83 year old male with history of CKD stage IIIa, chronic gout, hypertension, hyperlipidemia, interstitial pulmonary disease, bronchiectasis, COPD, who presents emergency department for chief concerns of fever, chills, shortness of breath for 2 weeks.  Vitals in the ED showed temperature of 98.4, respiration rate 25, heart rate 94, blood pressure initially 96/69 and improved to 107/66, SpO2 initially desatted to 88% on room air and improved to 98% on 2 L nasal cannula.  Serum sodium is 131, potassium 3.7, chloride 99, bicarb 25, BUN of 32, serum creatinine 1.58, EGFR 43, nonfasting blood glucose 131, WBC 7.1, hemoglobin 12.4, platelets of 191.  BNP was elevated at 145.6.  High sensitive troponin is 98.  Lactic acid 2.6 and on repeat was 1.3.  Procalcitonin was elevated at 0.31.  ED treatment: Ondansetron 4 mg IV one-time dose, cefepime 2 g IV, metronidazole 500 mg IV one-time dose, vancomycin, sodium chloride 1 L bolus.

## 2024-03-09 NOTE — ED Notes (Signed)
 Verified fluid orders with provider at this time.

## 2024-03-09 NOTE — Assessment & Plan Note (Signed)
 With history of anterior STEMI in 2006, transferred to [facility] status post cardiac catheterization with PCI to mid LAD using Cypher stent; currently no longer on Plavix Complete echo on 05/17/2022: Estimated ejection fraction is 55%, trivial mitral regurgitation, trivial tricuspid regurgitation, trivial pulmonary valve regurgitation; aortic valve has normal variant Home aspirin 81 mg daily, rosuvastatin 20 mg nightly were resumed on admission

## 2024-03-09 NOTE — ED Notes (Signed)
Pt gone to xray

## 2024-03-09 NOTE — ED Provider Notes (Signed)
 [facility] Provider Note    Event Date/Time   First MD Initiated Contact with Patient 03/09/24 1132     (approximate)   History   Shortness of Breath (/)   HPI  Todd Rojas is a 83 y.o. male who is otherwise healthy comes in with fever.  On review of notes patient was seen on 03/06/2024.  Patient has had some shortness of breath and concern for fevers.  When he was seen his heart rate was 116 with a temperature of 103.1.  His workup was reassuring.  Patient was discharged home with blood cultures pending and follow-up with PCP.  He was seen on 3/20.  At this time he was noted to have a total of almost 2 weeks of fever malaise, weakness with night sweats.  And had been on doxycycline for 8 to 9 days.  No known tick bites.   IMPRESSION:  1. 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 Pneumonia is less likely but not excluded.  2. Advanced coronary vascular calcification.  3. A 2.5 cm left thyroid hypodense nodule. Recommend thyroid US.   His blood cultures have been negative for 72 hours.  According to family has had a fever for 2 weeks now.  They did give ibuprofen just prior to coming in but had a temperature of 102 last night.  He was between the doxycycline to the Levaquin and is taken 2 doses of it but now has got so weak that he is difficult to get up and get around.  He is also continue to have shortness of breath and now nausea, vomiting.  No upper abdominal pain does report some headaches.  He denies any back pain currently.  No prior back surgeries.  He denies any redness, toe infection.  Physical Exam   Triage Vital Signs: ED Triage Vitals  Encounter Vitals Group     BP 03/09/24 1125 97/63     Systolic BP Percentile --      Diastolic BP Percentile --      Pulse Rate 03/09/24 1125 100     Resp 03/09/24 1125 (!) 24     Temp 03/09/24 1125 98.4 F (36.9 C)     Temp Source 03/09/24 1125 Oral     SpO2  03/09/24 1125 93 %     Weight 03/09/24 1127 215 lb (97.5 kg)     Height 03/09/24 1127 5\' 9"  (1.753 m)     Head Circumference --      Peak Flow --      Pain Score 03/09/24 1126 9     Pain Loc --      Pain Education --      Exclude from Growth Chart --     Most recent vital signs: Vitals:   03/09/24 1125  BP: 97/63  Pulse: 100  Resp: (!) 24  Temp: 98.4 F (36.9 C)  SpO2: 93%     General: Awake, no distress.  CV:  Good peripheral perfusion.  Resp:  Normal effort.  Abd:  Slightly distended but no obvious tenderness Other:  No wounds noted on the toes.  No obvious skin rash.   ED Results / Procedures / Treatments   Labs (all labs ordered are listed, but only abnormal results are displayed) Labs Reviewed  CULTURE, BLOOD (ROUTINE X 2)  CULTURE, BLOOD (ROUTINE X 2)  RESPIRATORY PANEL BY PCR  LACTIC ACID, PLASMA  LACTIC ACID, PLASMA  COMPREHENSIVE METABOLIC PANEL  CBC  WITH DIFFERENTIAL/PLATELET  URINALYSIS, W/ REFLEX TO CULTURE (INFECTION SUSPECTED)  BRAIN NATRIURETIC PEPTIDE  PROCALCITONIN  TROPONIN I (HIGH SENSITIVITY)     EKG  My interpretation of EKG:  Sinus rhythm 98 without any ST elevation or T wave inversions, normal intervals  RADIOLOGY I have reviewed the CT head personally interpreted no evidence of intracranial hemorrhage   PROCEDURES:  Critical Care performed: No  .1-3 Lead EKG Interpretation  Performed by: Concha Se, MD Authorized by: Concha Se, MD     Interpretation: normal     ECG rate:  90   ECG rate assessment: normal     Rhythm: sinus rhythm     Ectopy: none     Conduction: normal   .Critical Care  Performed by: Concha Se, MD Authorized by: Concha Se, MD   Critical care provider statement:    Critical care time (minutes):  30   Critical care was necessary to treat or prevent imminent or life-threatening deterioration of the following conditions:  Respiratory failure   Critical care was time spent personally by  me on the following activities:  Development of treatment plan with patient or surrogate, discussions with consultants, evaluation of patient's response to treatment, examination of patient, ordering and review of laboratory studies, ordering and review of radiographic studies, ordering and performing treatments and interventions, pulse oximetry, re-evaluation of patient's condition and review of old charts    MEDICATIONS ORDERED IN ED: Medications  ceFEPIme (MAXIPIME) 2 g in sodium chloride 0.9 % 100 mL IVPB (2 g Intravenous New Bag/Given 03/09/24 1236)  metroNIDAZOLE (FLAGYL) IVPB 500 mg (has no administration in time range)  vancomycin (VANCOCIN) IVPB 1000 mg/200 mL premix (has no administration in time range)  ondansetron (ZOFRAN) injection 4 mg (4 mg Intravenous Given 03/09/24 1240)  sodium chloride 0.9 % bolus 1,000 mL (1,000 mLs Intravenous New Bag/Given 03/09/24 1236)     IMPRESSION / MDM / ASSESSMENT AND PLAN / ED COURSE  I reviewed the triage vital signs and the nursing notes.   Patient's presentation is most consistent with acute presentation with potential threat to life or bodily function.   Patient comes in with concerns for worsening infection.  Patient saturations dipped down to 88% so patient was placed on 2 L.  He does report shortness of breath and his recent CT imaging did show atelectasis with less likely pneumonia but given patient's symptoms this seems like it is more consistent with pneumonia.  Patient does report some headaches will get CT head to ensure no intracranial abscess.  He has no neck stiffness sore throat to suggest throat infection, meningitis.  He really does not seem to have any abdominal pain with palpation but family do report concerns for nausea and vomiting we will get a CT scan to ensure no other acute pathology that could be missed.  He denies any travel to suggest topical infection.  I will add on a viral panel for patient. Regardless given patient is  not getting better on oral antibiotics I do feel like patient will be admitted to the hospital for further workup and management  Troponin slightly elevated we will continue to trend I suspect more likely demand.  Lactate is elevated at 2.6.  CMP shows low sodium and worsening AKI which could explain patient's dehydration patient getting some IV fluids.  Given patient's age and family's concern for him building up fluid I will start off with 1 L of fluids  CT imaging shows some incidental  findings which I discussed with family as well as need to follow-up with urology for the prostate.  They are aware.  Possible pneumonia which I think seems most consistent with.  I reviewed the CT head and I do not see any evidence of large intracranial mass, abscess however the images were not part of the CT head they were actually placed on the abdomen images.  I did call radiology discussed with the radiologist he would make sure these get put into the CT head and make sure that a read was done.  He states that he could not read CT head images.  I will let the hospitalist know that this imaging is still pending and I will admit to the hospital team   The patient is on the cardiac monitor to evaluate for evidence of arrhythmia and/or significant heart rate changes.      FINAL CLINICAL IMPRESSION(S) / ED DIAGNOSES   Final diagnoses:  AKI (acute kidney injury) (HCC)  Hyponatremia  Sepsis, due to unspecified organism, unspecified whether acute organ dysfunction present (HCC)  Acute respiratory failure with hypoxia (HCC)     Rx / DC Orders   ED Discharge Orders     None        Note:  This document was prepared using Dragon voice recognition software and may include unintentional dictation errors.   Concha Se, MD 03/09/24 [phone]

## 2024-03-09 NOTE — Assessment & Plan Note (Signed)
 Patient had 1 set of blood cultures done on 3/19 03/2024: Which was read as no growth, 3 days And in setting of elevated high sensitive troponin, shortness of breath, fever, myocarditis cannot be excluded at this time Complete echo ordered on admission

## 2024-03-09 NOTE — Sepsis Progress Note (Signed)
 Elink following code sepsis

## 2024-03-09 NOTE — ED Notes (Addendum)
 Pt pastor at bedside at this time. Pt and family asked for privacy at this time.

## 2024-03-09 NOTE — Assessment & Plan Note (Signed)
 Home rosuvastatin 20 mg nightly resumed

## 2024-03-09 NOTE — Assessment & Plan Note (Addendum)
 Home furosemide and lisinopril 20 mg daily were not resumed on admission in setting of increase in sCr level from baseline AM team to resume when the benefits outweigh the risk Hydralazine 5 mg IV every 6 hours as needed for SBP greater 165, 5 days ordered

## 2024-03-09 NOTE — Consult Note (Signed)
 Pharmacy Antibiotic Note  Todd Rojas is a 83 y.o. male admitted on 03/09/2024 with  sepsis/unknown source . PMH includes CKD stage IIIa, chronic gout, hypertension, hyperlipidemia, interstitial pulmonary disease, bronchiectasis, COPD.  Pharmacy has been consulted for cefepime and Vancomycin dosing.   Plan: Cefepime 2gm IV q 12 hours (7 days total per consult) Vancomycin 1000 mg IV x 1 dose given 3/22 at 1523. Will complete loading dose with an additional 1000 mg now (total 2000 mg IV today). Due to AKI on admission will follow AM Scr prior to further vancomycin doses. Metronidazole per MD orders Will follow up renal function, clinical progress, and cultures to optimize therapy  Height: 5\' 9"  (175.3 cm) Weight: 97.5 kg (215 lb) IBW/kg (Calculated) : 70.7  Temp (24hrs), Avg:98.4 F (36.9 C), Min:98.4 F (36.9 C), Max:98.4 F (36.9 C)  Recent Labs  Lab 03/06/24 1409 03/06/24 1603 03/09/24 1145 03/09/24 1427  WBC 6.4  --  7.1  --   CREATININE 1.36*  --  1.58*  --   LATICACIDVEN 1.6 1.5 2.6* 1.3    Estimated Creatinine Clearance: 40.8 mL/min (A) (by C-G formula based on SCr of 1.58 mg/dL (H)).    No Known Allergies  Antimicrobials this admission: 3/22 cefepime >>  3/22 Vancomycin >>  3/22 metronidazole >>  Dose adjustments this admission: n/a  Microbiology results: 3/22 BCx: collected    Thank you for allowing pharmacy to be a part of this patient's care.  Nathifa Ritthaler Rodriguez-Guzman PharmD, BCPS 03/09/2024 3:42 PM

## 2024-03-09 NOTE — Consult Note (Addendum)
 PHARMACY - ANTICOAGULATION CONSULT NOTE  Pharmacy Consult for Heparin infusion Indication: chest pain/ACS  No Known Allergies  Patient Measurements: Height: 5\' 9"  (175.3 cm) Weight: 97.5 kg (215 lb) IBW/kg (Calculated) : 70.7 Heparin Dosing Weight: 91.1 kg  Vital Signs: Temp: 98.4 F (36.9 C) (03/22 1125) Temp Source: Oral (03/22 1125) BP: 112/56 (03/22 1400) Pulse Rate: 91 (03/22 1400)  Labs: Recent Labs    03/09/24 1145 03/09/24 1146 03/09/24 1427  HGB 12.4*  --   --   HCT 37.3*  --   --   PLT 191  --   --   CREATININE 1.58*  --   --   TROPONINIHS  --  98* 101*    Estimated Creatinine Clearance: 40.8 mL/min (A) (by C-G formula based on SCr of 1.58 mg/dL (H)).  Medications:  Per patient and family, he is NOT on AC anymore. ASA only prior to admission.  Assessment: Patient presents to ED with hx of shortness of breath for 2 weeks, fever, and chills. PMH includes CKD stage IIIa, chronic gout, hypertension, hyperlipidemia, interstitial pulmonary disease, bronchiectasis, and COPD. Pharmacy consulted to initiate and manage heparin infusion after elevation on high sensitive troponin I.  Baseline hgb and plt appropriate to start heparin infusion. Baseline aptt and HL ordered as well.  Goal of Therapy:  Heparin level 0.3-0.7 units/ml Monitor platelets by anticoagulation protocol: Yes   Plan:  Give 4000 units bolus x 1 Start heparin infusion at 1400 units/hr Check heparin level in 6 hours and daily while on heparin Continue to monitor H&H and platelets  Amiera Herzberg Rodriguez-Guzman PharmD, BCPS 03/09/2024 3:49 PM

## 2024-03-09 NOTE — Progress Notes (Signed)
 CODE SEPSIS - PHARMACY COMMUNICATION  **Broad Spectrum Antibiotics should be administered within 1 hour of Sepsis diagnosis**  Time Code Sepsis Called/Page Received: 1153  Antibiotics Ordered: cefepime, vanc  Time of 1st antibiotic administration: 1236  Additional action taken by pharmacy:    If necessary, Name of Provider/Nurse Contacted:      Angelique Blonder ,PharmD Clinical Pharmacist  03/09/2024  12:38 PM

## 2024-03-09 NOTE — ED Notes (Addendum)
 Marland Kitchen

## 2024-03-09 NOTE — Assessment & Plan Note (Addendum)
 Does not meet acute kidney injury, suspect prerenal in setting of nausea vomiting, creatinine with some improvement to 1.47 today Serum creatinine baseline is 1.35-1.36/eGFR 52-55, consistent with CKD stage III A -Monitor renal function -Avoid nephrotoxins

## 2024-03-09 NOTE — Assessment & Plan Note (Addendum)
 DuoNebs 4 times daily, 3 doses ordered on admission Home long-acting inhaler equivalent resumed on admission

## 2024-03-10 ENCOUNTER — Inpatient Hospital Stay

## 2024-03-10 DIAGNOSIS — J449 Chronic obstructive pulmonary disease, unspecified: Secondary | ICD-10-CM

## 2024-03-10 DIAGNOSIS — J9601 Acute respiratory failure with hypoxia: Secondary | ICD-10-CM | POA: Diagnosis not present

## 2024-03-10 DIAGNOSIS — Z87891 Personal history of nicotine dependence: Secondary | ICD-10-CM

## 2024-03-10 DIAGNOSIS — R509 Fever, unspecified: Secondary | ICD-10-CM

## 2024-03-10 DIAGNOSIS — R7989 Other specified abnormal findings of blood chemistry: Secondary | ICD-10-CM

## 2024-03-10 DIAGNOSIS — R079 Chest pain, unspecified: Secondary | ICD-10-CM | POA: Diagnosis not present

## 2024-03-10 DIAGNOSIS — A419 Sepsis, unspecified organism: Secondary | ICD-10-CM | POA: Diagnosis not present

## 2024-03-10 DIAGNOSIS — I214 Non-ST elevation (NSTEMI) myocardial infarction: Secondary | ICD-10-CM | POA: Diagnosis not present

## 2024-03-10 DIAGNOSIS — M170 Bilateral primary osteoarthritis of knee: Secondary | ICD-10-CM

## 2024-03-10 DIAGNOSIS — I10 Essential (primary) hypertension: Secondary | ICD-10-CM

## 2024-03-10 DIAGNOSIS — E041 Nontoxic single thyroid nodule: Secondary | ICD-10-CM

## 2024-03-10 DIAGNOSIS — E785 Hyperlipidemia, unspecified: Secondary | ICD-10-CM

## 2024-03-10 LAB — CBC
HCT: 32.3 % — ABNORMAL LOW (ref 39.0–52.0)
Hemoglobin: 10.7 g/dL — ABNORMAL LOW (ref 13.0–17.0)
MCH: 32.2 pg (ref 26.0–34.0)
MCHC: 33.1 g/dL (ref 30.0–36.0)
MCV: 97.3 fL (ref 80.0–100.0)
Platelets: 180 10*3/uL (ref 150–400)
RBC: 3.32 MIL/uL — ABNORMAL LOW (ref 4.22–5.81)
RDW: 13.4 % (ref 11.5–15.5)
WBC: 6.8 10*3/uL (ref 4.0–10.5)
nRBC: 0 % (ref 0.0–0.2)

## 2024-03-10 LAB — URINALYSIS, W/ REFLEX TO CULTURE (INFECTION SUSPECTED)
Bacteria, UA: NONE SEEN
Bilirubin Urine: NEGATIVE
Glucose, UA: NEGATIVE mg/dL
Hgb urine dipstick: NEGATIVE
Ketones, ur: NEGATIVE mg/dL
Leukocytes,Ua: NEGATIVE
Nitrite: NEGATIVE
Protein, ur: NEGATIVE mg/dL
Specific Gravity, Urine: 1.031 — ABNORMAL HIGH (ref 1.005–1.030)
pH: 5 (ref 5.0–8.0)

## 2024-03-10 LAB — BASIC METABOLIC PANEL
Anion gap: 6 (ref 5–15)
BUN: 24 mg/dL — ABNORMAL HIGH (ref 8–23)
CO2: 24 mmol/L (ref 22–32)
Calcium: 7.3 mg/dL — ABNORMAL LOW (ref 8.9–10.3)
Chloride: 104 mmol/L (ref 98–111)
Creatinine, Ser: 1.47 mg/dL — ABNORMAL HIGH (ref 0.61–1.24)
GFR, Estimated: 47 mL/min — ABNORMAL LOW (ref >60)
Glucose, Bld: 120 mg/dL — ABNORMAL HIGH (ref 70–99)
Potassium: 3.8 mmol/L (ref 3.5–5.1)
Sodium: 134 mmol/L — ABNORMAL LOW (ref 135–145)

## 2024-03-10 LAB — HEPARIN LEVEL (UNFRACTIONATED)
Heparin Unfractionated: 0.27 [IU]/mL — ABNORMAL LOW (ref 0.30–0.70)
Heparin Unfractionated: 0.42 [IU]/mL (ref 0.30–0.70)
Heparin Unfractionated: 0.49 [IU]/mL (ref 0.30–0.70)

## 2024-03-10 LAB — TROPONIN I (HIGH SENSITIVITY)
Troponin I (High Sensitivity): 175 ng/L (ref <18)
Troponin I (High Sensitivity): 226 ng/L (ref <18)

## 2024-03-10 MED ORDER — MORPHINE SULFATE (PF) 2 MG/ML IV SOLN
2.0000 mg | INTRAVENOUS | Status: DC | PRN
  Administered 2024-03-10 – 2024-03-14 (×4): 2 mg via INTRAVENOUS
  Filled 2024-03-10 (×4): qty 1

## 2024-03-10 MED ORDER — TRAMADOL HCL 50 MG PO TABS
50.0000 mg | ORAL_TABLET | Freq: Four times a day (QID) | ORAL | Status: DC | PRN
  Administered 2024-03-10 – 2024-03-14 (×8): 50 mg via ORAL
  Filled 2024-03-10 (×10): qty 1

## 2024-03-10 MED ORDER — SODIUM CHLORIDE 0.9 % IV SOLN
500.0000 mg | INTRAVENOUS | Status: AC
  Administered 2024-03-10 – 2024-03-12 (×3): 500 mg via INTRAVENOUS
  Filled 2024-03-10 (×3): qty 5

## 2024-03-10 MED ORDER — VANCOMYCIN HCL 1250 MG/250ML IV SOLN
1250.0000 mg | INTRAVENOUS | Status: DC
  Filled 2024-03-10: qty 250

## 2024-03-10 MED ORDER — HEPARIN BOLUS VIA INFUSION
1400.0000 [IU] | Freq: Once | INTRAVENOUS | Status: AC
  Administered 2024-03-10: 1400 [IU] via INTRAVENOUS
  Filled 2024-03-10: qty 1400

## 2024-03-10 NOTE — Consult Note (Signed)
 PHARMACY - ANTICOAGULATION CONSULT NOTE  Pharmacy Consult for Heparin infusion Indication: chest pain/ACS  No Known Allergies  Patient Measurements: Height: 5\' 9"  (175.3 cm) Weight: 97.5 kg (215 lb) IBW/kg (Calculated) : 70.7 Heparin Dosing Weight: 91.1 kg  Vital Signs: Temp: 98.2 F (36.8 C) (03/23 1515) Temp Source: Oral (03/23 1354) BP: 120/63 (03/23 1515) Pulse Rate: 80 (03/23 1515)  Labs: Recent Labs    03/09/24 1145 03/09/24 1146 03/09/24 1427 03/09/24 1610 03/09/24 1610 03/10/24 0246 03/10/24 0445 03/10/24 0809 03/10/24 1049 03/10/24 1338 03/10/24 1518  HGB 12.4*  --   --   --   --   --  10.7*  --   --   --   --   HCT 37.3*  --   --   --   --   --  32.3*  --   --   --   --   PLT 191  --   --   --   --   --  180  --   --   --   --   APTT  --   --   --  36  --   --   --   --   --   --   --   HEPARINUNFRC  --   --   --  <0.10*   < > 0.27*  --  0.42  --   --  0.49  CREATININE 1.58*  --   --   --   --   --  1.47*  --   --   --   --   TROPONINIHS  --    < > 101*  --   --   --   --   --  175* 226*  --    < > = values in this interval not displayed.    Estimated Creatinine Clearance: 43.8 mL/min (A) (by C-G formula based on SCr of 1.47 mg/dL (H)).  Medications:  Per patient and family, he is NOT on AC anymore. ASA only prior to admission.  Assessment: Patient presents to ED with hx of shortness of breath for 2 weeks, fever, and chills. PMH includes CKD stage IIIa, chronic gout, hypertension, hyperlipidemia, interstitial pulmonary disease, bronchiectasis, and COPD. Pharmacy consulted to initiate and manage heparin infusion after elevation on high sensitive troponin I.  Baseline hgb and plt appropriate to start heparin infusion. Baseline aptt and HL ordered as well.  Goal of Therapy:  Heparin level 0.3-0.7 units/ml Monitor platelets by anticoagulation protocol: Yes  03/23 0246 HL 0.27, subtherapeutic 03/23 0809 HL 0.42,  therapeutic x1 03/23 1518 HL 0.49,   therapeutic x2   Plan:  Continue heparin infusion at 1600 units/hr Check heparin level in AM Monitor daily heparin levels while on heparin infusion Continue to monitor H&H and platelets daily  Thank you for involving pharmacy in this patient's care.   Rockwell Alexandria, PharmD Clinical Pharmacist 03/10/2024 3:43 PM

## 2024-03-10 NOTE — Consult Note (Signed)
 Pharmacy Antibiotic Note  Todd Rojas is a 83 y.o. male admitted on 03/09/2024 with  sepsis/unknown source . PMH includes CKD stage IIIa, chronic gout, hypertension, hyperlipidemia, interstitial pulmonary disease, bronchiectasis, COPD.  Pharmacy has been consulted for cefepime and Vancomycin dosing.  -MRSA PCR pending - CXR 3/23: Increasing bibasilar atelectasis, right greater than left. Possibility of developing pneumonia in the right lower lobe cannot be excluded.  Plan: Cefepime 2gm IV q 12 hours (7 days total per consult) Vancomycin 1250 mg IV q24h      Goal AUC 400-550.      Expected AUC: 494      Cmin 13.7      SCr used: 1.47    Will follow up renal function, clinical progress, and cultures to optimize therapy   Height: 5\' 9"  (175.3 cm) Weight: 97.5 kg (215 lb) IBW/kg (Calculated) : 70.7  Temp (24hrs), Avg:98.4 F (36.9 C), Min:97.7 F (36.5 C), Max:99.4 F (37.4 C)  Recent Labs  Lab 03/06/24 1409 03/06/24 1603 03/09/24 1145 03/09/24 1427 03/10/24 0445  WBC 6.4  --  7.1  --  6.8  CREATININE 1.36*  --  1.58*  --  1.47*  LATICACIDVEN 1.6 1.5 2.6* 1.3  --     Estimated Creatinine Clearance: 43.8 mL/min (A) (by C-G formula based on SCr of 1.47 mg/dL (H)).    No Known Allergies  Antimicrobials this admission: 3/22 cefepime >>  3/22 Vancomycin >>  3/22 metronidazole >> 3/23 3/23 azithromycin >>  Dose adjustments this admission:   Microbiology results: 3/22 BCx: NG<24h   Thank you for allowing pharmacy to be a part of this patient's care.  Bari Mantis PharmD Clinical Pharmacist 03/10/2024

## 2024-03-10 NOTE — Consult Note (Signed)
 Todd Rojas is a 83 y.o. male  [id number]  Primary Cardiologist: [facility] Reason for Consultation: Elevated troponin  HPI: This is a 83 year old white male with a past medical history of hypertension hyperlipidemia interstitial lung disease and history of STEMI and coronary artery disease normally followed at [facility] cardiology presented to the hospital with fever chills and was found to have on imaging right lower lobe pneumonia.  I was asked to evaluate the patient because of intermittent chest pain and mildly elevated troponin with the last troponin being 98.  Patient has intermittent chest pain but is mostly pleuritic.  He has shortness of breath and has been desaturating and required 2 L nasal cannula.   Review of Systems: No orthopnea PND but does have some leg swelling   Past Medical History:  Diagnosis Date   Actinic keratosis    Arthritis    Asthma    Basal cell carcinoma 03/17/2020   right ant lat deltoid   CAD (coronary artery disease) 02/13/2012   Overview:   12/2004 - Anginal equivalent symptoms:  Left arm pain, weakness, and shortness of breath.   12/2004:  Anterior STEMI.  Transferred to [facility]   01/06/2005 - Cardiac cath:  EF 51%, an occluded mid LAD and a large OM2 with 75% ostial stenosis.  PCI of the mid occluded LAD using a Cypher stent.   03/2005 - 2D echo:  EF >55%, LAE 4.7 cm, no significant valvular disease.   02/2008:  Plavix stopped.   06/2008:  Recurrent angina - symptoms of chest tightness.  Presented to [facility]  Cardiac catheterization.  The patient stated nonobstructive CAD seen.  Plavix restarted at that time.   03/2009 - 2D echo:  EF 35%, akinetic apex and septum.  Stress echocardiogram deferred for cardiac catheterization.   03/30/2009:  PCI 90% proximal LAD with a 3.0 x 18 mm Xience DES.   02/2010: Slow and gradual return of chest tightness, fatigue and generalized weakness concerning for return of anginal equivalent.   Stress test did not show ischemia to the heart muscle during exercise.  [facility]idenc   CHF (congestive heart failure) (HCC)    Chronic kidney disease    kidney stone left side   COPD (chronic obstructive pulmonary disease) (HCC) 07/03/2017   Dyslipidemia 02/13/2012   Overview:   03/2009  TC 158, TG 88, HDL 39, LDL 102.  11/2013  TC 148, TG 115, HDL 44.3, LDL 80.7 (done at PCP's office)   10/2014 TC 146  11/2015 TC 123, TG 86, HDL 41.7, LDL 64  11/2016 TC 140, TG 129, HDL 40.1, LDL 74    Dyspnea    GERD (gastroesophageal reflux disease)    GI disease 03/09/2013   Overview:   Dysphagia - EGD demonstrated esophageal stricture which was dilated   GERD   Hemorrhoids    Hearing loss 03/09/2013   History of kidney stones    Hyperlipidemia, unspecified 07/03/2017   Hypertension 02/13/2012   Myocardial infarction [facility]) 2004   Personal history of gout 12/09/2016   Primary osteoarthritis of both knees 05/05/2015   Right-sided low back pain without sciatica 04/14/2016   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   R ear - ED&C    (Not in a hospital admission)     allopurinol  300 mg Oral Daily   aspirin EC  81 mg Oral Daily   cyanocobalamin  1,000 mcg Oral Daily   mometasone-formoterol  2 puff Inhalation BID  rosuvastatin  20 mg Oral QHS    Infusions:  azithromycin Stopped (03/10/24 1216)   ceFEPime (MAXIPIME) IV Stopped (03/10/24 1241)   heparin 1,600 Units/hr (03/10/24 1156)    No Known Allergies  Social History   Socioeconomic History   Marital status: Married    Spouse name: Not on file   Number of children: Not on file   Years of education: Not on file   Highest education level: Not on file  Occupational History   Not on file  Tobacco Use   Smoking status: Former   Smokeless tobacco: Former    Types: Chew    Quit date: 08/07/1994  Vaping Use   Vaping status: Never Used  Substance and Sexual Activity   Alcohol use: No   Drug use: No   Sexual activity: Not Currently   Other Topics Concern   Not on file  Social History Narrative   Not on file   Social Drivers of Health   Financial Resource Strain: Low Risk  (10/23/2023)   Received from [facility]   Overall Financial Resource Strain (CARDIA)    Difficulty of Paying Living Expenses: Not hard at all  Food Insecurity: No Food Insecurity (10/23/2023)   Received from [facility]   Hunger Vital Sign    Worried About Running Out of Food in the Last Year: Never true    Ran Out of Food in the Last Year: Never true  Transportation Needs: No Transportation Needs (10/23/2023)   Received from [facility] - Transportation    In the past 12 months, has lack of transportation kept you from medical appointments or from getting medications?: No    Lack of Transportation (Non-Medical): No  Physical Activity: Not on file  Stress: Not on file  Social Connections: Not on file  Intimate Partner Violence: Not on file    Family History  Problem Relation Age of Onset   Alzheimer's disease Mother    Leukemia Father    Prostate cancer Neg Hx    Kidney cancer Neg Hx    Bladder Cancer Neg Hx     PHYSICAL EXAM: Vitals:   03/10/24 1130 03/10/24 1230  BP: (!) 103/55 (!) 96/55  Pulse: 87 84  Resp:    Temp:    SpO2: 96% 96%     Intake/Output Summary (Last 24 hours) at 03/10/2024 1301 Last data filed at 03/10/2024 1241 Gross per 24 hour  Intake 1744.77 ml  Output --  Net 1744.77 ml    General:  Well appearing. No respiratory difficulty HEENT: normal Neck: supple. no JVD. Carotids 2+ bilat; no bruits. No lymphadenopathy or thryomegaly appreciated. Cor: PMI nondisplaced. Regular rate & rhythm. No rubs, gallops or murmurs. Lungs: clear Abdomen: soft, nontender, nondistended. No hepatosplenomegaly. No bruits or masses. Good bowel sounds. Extremities: no cyanosis, clubbing, rash, edema Neuro: alert & oriented x 3, cranial nerves grossly intact. moves  all 4 extremities w/o difficulty. Affect pleasant.  ECG: Normal sinus rhythm nonspecific ST-T changes with old inferior wall MI  Results for orders placed or performed during the hospital encounter of 03/09/24 (from the past 24 hours)  Respiratory (~20 pathogens) panel by PCR     Status: None   Collection Time: 03/09/24  1:56 PM   Specimen: Nasopharyngeal Swab; Respiratory  Result Value Ref Range   Adenovirus NOT DETECTED NOT DETECTED   Coronavirus 229E NOT DETECTED NOT DETECTED   Coronavirus HKU1 NOT DETECTED NOT DETECTED  Coronavirus NL63 NOT DETECTED NOT DETECTED   Coronavirus OC43 NOT DETECTED NOT DETECTED   Metapneumovirus NOT DETECTED NOT DETECTED   Rhinovirus / Enterovirus NOT DETECTED NOT DETECTED   Influenza A NOT DETECTED NOT DETECTED   Influenza B NOT DETECTED NOT DETECTED   Parainfluenza Virus 1 NOT DETECTED NOT DETECTED   Parainfluenza Virus 2 NOT DETECTED NOT DETECTED   Parainfluenza Virus 3 NOT DETECTED NOT DETECTED   Parainfluenza Virus 4 NOT DETECTED NOT DETECTED   Respiratory Syncytial Virus NOT DETECTED NOT DETECTED   Bordetella pertussis NOT DETECTED NOT DETECTED   Bordetella Parapertussis NOT DETECTED NOT DETECTED   Chlamydophila pneumoniae NOT DETECTED NOT DETECTED   Mycoplasma pneumoniae NOT DETECTED NOT DETECTED  Lactic acid, plasma     Status: None   Collection Time: 03/09/24  2:27 PM  Result Value Ref Range   Lactic Acid, Venous 1.3 0.5 - 1.9 mmol/L  Troponin I (High Sensitivity)     Status: Abnormal   Collection Time: 03/09/24  2:27 PM  Result Value Ref Range   Troponin I (High Sensitivity) 101 (HH) <18 ng/L  APTT     Status: None   Collection Time: 03/09/24  4:10 PM  Result Value Ref Range   aPTT 36 24 - 36 seconds  Heparin level (unfractionated)     Status: Abnormal   Collection Time: 03/09/24  4:10 PM  Result Value Ref Range   Heparin Unfractionated <0.10 (L) 0.30 - 0.70 IU/mL  Urinalysis, w/ Reflex to Culture (Infection Suspected) -Urine,  Clean Catch     Status: Abnormal   Collection Time: 03/10/24  1:26 AM  Result Value Ref Range   Specimen Source URINE, CLEAN CATCH    Color, Urine YELLOW (A) YELLOW   APPearance HAZY (A) CLEAR   Specific Gravity, Urine 1.031 (H) 1.005 - 1.030   pH 5.0 5.0 - 8.0   Glucose, UA NEGATIVE NEGATIVE mg/dL   Hgb urine dipstick NEGATIVE NEGATIVE   Bilirubin Urine NEGATIVE NEGATIVE   Ketones, ur NEGATIVE NEGATIVE mg/dL   Protein, ur NEGATIVE NEGATIVE mg/dL   Nitrite NEGATIVE NEGATIVE   Leukocytes,Ua NEGATIVE NEGATIVE   RBC / HPF 0-5 0 - 5 RBC/hpf   WBC, UA 0-5 0 - 5 WBC/hpf   Bacteria, UA NONE SEEN NONE SEEN   Squamous Epithelial / HPF 0-5 0 - 5 /HPF   Mucus PRESENT    Hyaline Casts, UA PRESENT   Heparin level (unfractionated)     Status: Abnormal   Collection Time: 03/10/24  2:46 AM  Result Value Ref Range   Heparin Unfractionated 0.27 (L) 0.30 - 0.70 IU/mL  Basic metabolic panel     Status: Abnormal   Collection Time: 03/10/24  4:45 AM  Result Value Ref Range   Sodium 134 (L) 135 - 145 mmol/L   Potassium 3.8 3.5 - 5.1 mmol/L   Chloride 104 98 - 111 mmol/L   CO2 24 22 - 32 mmol/L   Glucose, Bld 120 (H) 70 - 99 mg/dL   BUN 24 (H) 8 - 23 mg/dL   Creatinine, Ser 1.61 (H) 0.61 - 1.24 mg/dL   Calcium 7.3 (L) 8.9 - 10.3 mg/dL   GFR, Estimated 47 (L) >60 mL/min   Anion gap 6 5 - 15  CBC     Status: Abnormal   Collection Time: 03/10/24  4:45 AM  Result Value Ref Range   WBC 6.8 4.0 - 10.5 K/uL   RBC 3.32 (L) 4.22 - 5.81 MIL/uL   Hemoglobin 10.7 (  L) 13.0 - 17.0 g/dL   HCT 16.1 (L) 09.6 - 04.5 %   MCV 97.3 80.0 - 100.0 fL   MCH 32.2 26.0 - 34.0 pg   MCHC 33.1 30.0 - 36.0 g/dL   RDW 40.9 81.1 - 91.4 %   Platelets 180 150 - 400 K/uL   nRBC 0.0 0.0 - 0.2 %  Heparin level (unfractionated)     Status: None   Collection Time: 03/10/24  8:09 AM  Result Value Ref Range   Heparin Unfractionated 0.42 0.30 - 0.70 IU/mL  Troponin I (High Sensitivity)     Status: Abnormal   Collection Time:  03/10/24 10:49 AM  Result Value Ref Range   Troponin I (High Sensitivity) 175 (HH) <18 ng/L   US THYROID Result Date: 03/10/2024 CLINICAL DATA:  Incidental on CT. EXAM: THYROID ULTRASOUND TECHNIQUE: Ultrasound examination of the thyroid gland and adjacent soft tissues was performed. COMPARISON:  None Available. FINDINGS: Parenchymal Echotexture: Moderately heterogenous Isthmus: 0.4 cm Right lobe: 4.5 x 1.8 x 1.3 cm Left lobe: 5.3 x 2.0 x 1.4 cm _________________________________________________________ Estimated total number of nodules >/= 1 cm: 1 Number of spongiform nodules >/=  2 cm not described below (TR1): 0 Number of mixed cystic and solid nodules >/= 1.5 cm not described below (TR2): 0 _________________________________________________________ Nodule # 1: Solid hypoechoic nodule in the left inferior gland measures 2.2 x 1.5 x 1.6 cm. Margins are relatively well-defined. No calcifications or other suspicious features. Findings are consistent with TI-RADS category 4. **Given size (>/= 1.5 cm) and appearance, fine needle aspiration of this moderately suspicious nodule should be considered based on TI-RADS criteria. IMPRESSION: 1. Solitary 2.2 cm TI-RADS category 4 nodule in the left lower gland meets criteria to consider fine-needle aspiration biopsy. Outpatient biopsy is recommended. The above is in keeping with the ACR TI-RADS recommendations - J Am Coll Radiol 2017;14:587-595. Electronically Signed   By: Malachy Moan M.D.   On: 03/10/2024 12:37   CT CHEST WO CONTRAST Result Date: 03/10/2024 CLINICAL DATA:  Pneumonia, fever, shortness of breath, tachycardia, fever. EXAM: CT CHEST WITHOUT CONTRAST TECHNIQUE: Multidetector CT imaging of the chest was performed following the standard protocol without IV contrast. RADIATION DOSE REDUCTION: This exam was performed according to the departmental dose-optimization program which includes automated exposure control, adjustment of the mA and/or kV according  to patient size and/or use of iterative reconstruction technique. COMPARISON:  CT abdomen 03/09/2024 and CT chest 03/08/2024. FINDINGS: Cardiovascular: Atherosclerotic calcification of the aorta, aortic valve and coronary arteries. Enlarged pulmonic trunk and heart. No pericardial effusion. Mediastinum/Nodes: Thoracic inlet lymph nodes are not enlarged by CT size criteria. 2.7 cm low-attenuation left thyroid nodule. Mediastinal lymph nodes measure up to 12 mm in the low right paratracheal station, unchanged. Hilar regions are difficult to definitively evaluate without IV contrast. Axillary lymph nodes are not enlarged by CT size criteria. Esophagus is grossly unremarkable. Lungs/Pleura: Increased collapse/consolidation in the right lower lobe when compared with 03/08/2024. Increased dependent atelectasis in the left lower lobe. Trace right pleural fluid. Airway is unremarkable. Upper Abdomen: Tiny gallstones. Subcentimeter hyperdense lesion in the interpolar right kidney, too small to characterize. No specific follow-up necessary. Visualized portions of the liver, gallbladder, adrenal glands, kidneys, spleen, pancreas, stomach and bowel are otherwise grossly unremarkable. No upper abdominal adenopathy. Musculoskeletal: Degenerative changes in the spine. IMPRESSION: 1. Increasing bibasilar atelectasis, right greater than left. Possibility of developing pneumonia in the right lower lobe cannot be excluded. 2. Trace right pleural fluid. 3. 2.7  cm low-attenuation left thyroid nodule. Recommend thyroid ultrasound. (Ref: J Am Coll Radiol. 2015 Feb;12(2): 143-50). 4. Cholelithiasis. 5. Aortic atherosclerosis (ICD10-I70.0). Coronary artery calcification. 6. Enlarged pulmonic trunk, indicative of pulmonary arterial hypertension. Electronically Signed   By: Leanna Battles M.D.   On: 03/10/2024 10:10   CT HEAD WO CONTRAST ( ) Result Date: 03/09/2024 CLINICAL DATA:  Headache EXAM: CT HEAD WITHOUT CONTRAST TECHNIQUE:  Contiguous axial images were obtained from the base of the skull through the vertex without intravenous contrast. RADIATION DOSE REDUCTION: This exam was performed according to the departmental dose-optimization program which includes automated exposure control, adjustment of the mA and/or kV according to patient size and/or use of iterative reconstruction technique. COMPARISON:  MRI head 08/09/2007 FINDINGS: Streak artifact limits evaluation likely influenced by patient's arm positioning. Brain: No acute intracranial hemorrhage. No CT evidence of acute infarct. Nonspecific hypoattenuation in the periventricular and subcortical white matter favored to reflect chronic microvascular ischemic changes. No edema, mass effect, or midline shift. The basilar cisterns are patent. Ventricles: The ventricles are normal. Vascular: Atherosclerotic calcifications of the carotid siphons. No hyperdense vessel. Skull: No acute or aggressive finding. Orbits: Orbits are symmetric. Sinuses: Mild mucosal thickening in the ethmoid sinuses. Other: Mastoid air cells are clear. IMPRESSION: Examination is limited due to streak artifact likely related to positioning of the patient's arms. Within these limitations there is no acute intracranial abnormality appreciated. Mild chronic microvascular ischemic changes. Electronically Signed   By: Emily Filbert M.D.   On: 03/09/2024 15:18   CT ABDOMEN PELVIS W CONTRAST Result Date: 03/09/2024 CLINICAL DATA:  "Abdominal pain" nonlocalized. Chills. Fever. Shortness of breath for 2 weeks. EXAM: CT ABDOMEN AND PELVIS WITH CONTRAST TECHNIQUE: Multidetector CT imaging of the abdomen and pelvis was performed using the standard protocol following bolus administration of intravenous contrast. RADIATION DOSE REDUCTION: This exam was performed according to the departmental dose-optimization program which includes automated exposure control, adjustment of the mA and/or kV according to patient size and/or use  of iterative reconstruction technique. CONTRAST:  75mL OMNIPAQUE IOHEXOL 300 MG/ML  SOLN COMPARISON:  07/17/2017 FINDINGS: Lower chest: Emphysema. Patchy right lower lobe airspace disease. Mild cardiomegaly. Right hemidiaphragm elevation. Three vessel coronary artery calcification. Pulmonary artery enlargement, outflow tract 3.4 cm. Hepatobiliary: Too small to characterize segment 2 low-density lesion is most likely a cyst. Normal gallbladder, without biliary ductal dilatation. Pancreas: Mild motion degradation in the mid abdomen. Fatty replaced pancreatic head and uncinate process. Spleen: Normal in size, without focal abnormality. Adrenals/Urinary Tract: Normal adrenal glands. Lower pole right renal 9 mm cyst. No specific follow-up indicated. Left renal cortical thinning/scarring is mild. No hydronephrosis. Minimal left hydroureter is significantly improved and without acute cause. Presumed contrast throughout the urinary bladder is evidenced by hyperattenuation. Stomach/Bowel: Tiny hiatal hernia. Normal colon, appendix, and terminal ileum. Normal small bowel. Vascular/Lymphatic: Aortic atherosclerosis. Retroaortic left renal vein. No abdominopelvic adenopathy. Reproductive: Moderate prostatomegaly. Right posterior prostatic base 2.5 cm subtle hyperenhancing nodule. Other: No significant free fluid.  No free intraperitoneal air. Musculoskeletal: No acute osseous abnormality. IMPRESSION: 1. Portions of exam are mildly motion degraded. 2. Given this limitation, no acute process in the abdomen or pelvis. 3. Right lower lobe airspace disease is new and could represent infection or atelectasis in the setting of right hemidiaphragm elevation. 4. Subtle hyperenhancing right prostatic nodule. Consider outpatient urology consultation to exclude carcinoma. 5. Hyperattenuation throughout the bladder is most likely early contrast excretion. Correlate with urinalysis to exclude bladder hemorrhage, felt less likely. 6.  Incidental  findings, including: Tiny hiatal hernia. Coronary artery atherosclerosis. Aortic Atherosclerosis (ICD10-I70.0). 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 Electronically Signed   By: Jeronimo Greaves M.D.   On: 03/09/2024 14:15   DG Chest 2 View Result Date: 03/09/2024 CLINICAL DATA:  Shortness of breath, fever, chills for 2 weeks. EXAM: CHEST - 2 VIEW COMPARISON:  03/06/2024 FINDINGS: The heart size and mediastinal contours are within normal limits. Stable elevated right hemidiaphragm and right basilar scarring. The lungs are otherwise clear. IMPRESSION: No active cardiopulmonary disease. Electronically Signed   By: Danae Orleans M.D.   On: 03/09/2024 12:48     ASSESSMENT AND PLAN: #1 atypical chest pain with borderline elevated troponin in the setting of right lower lobe pneumonia and possible sepsis.  Blood cultures are still pending.  Patient does have extensive history of coronary artery disease and congestive heart failure.  Normally followed at [facility] by Dr. Allena Katz.  He had anterior STEMI in 2006 and was transferred to [facility] and had PCI of the mid LAD with Cypher stent.  His last ejection fraction on echocardiogram was 55% with trivial mitral regurgitation.  Agree with starting the patient on IV heparin and continue aspirin and lovastatin 20 mg once a day.  There is no indication for coronary intervention at this time. #2 patient has a history of congestive heart failure, will make further recommendation after evaluating echocardiogram.  Thank you very much for referral.  Adrian Blackwater

## 2024-03-10 NOTE — ED Notes (Signed)
 Patient transported to CT

## 2024-03-10 NOTE — Consult Note (Signed)
 PHARMACY - ANTICOAGULATION CONSULT NOTE  Pharmacy Consult for Heparin infusion Indication: chest pain/ACS  No Known Allergies  Patient Measurements: Height: 5\' 9"  (175.3 cm) Weight: 97.5 kg (215 lb) IBW/kg (Calculated) : 70.7 Heparin Dosing Weight: 91.1 kg  Vital Signs: Temp: 98.7 F (37.1 C) (03/22 2333) Temp Source: Oral (03/22 2333) BP: 107/60 (03/23 0002) Pulse Rate: 101 (03/23 0002)  Labs: Recent Labs    03/09/24 1145 03/09/24 1146 03/09/24 1427 03/09/24 1610 03/10/24 0246  HGB 12.4*  --   --   --   --   HCT 37.3*  --   --   --   --   PLT 191  --   --   --   --   APTT  --   --   --  36  --   HEPARINUNFRC  --   --   --  <0.10* 0.27*  CREATININE 1.58*  --   --   --   --   TROPONINIHS  --  98* 101*  --   --     Estimated Creatinine Clearance: 40.8 mL/min (A) (by C-G formula based on SCr of 1.58 mg/dL (H)).  Medications:  Per patient and family, he is NOT on AC anymore. ASA only prior to admission.  Assessment: Patient presents to ED with hx of shortness of breath for 2 weeks, fever, and chills. PMH includes CKD stage IIIa, chronic gout, hypertension, hyperlipidemia, interstitial pulmonary disease, bronchiectasis, and COPD. Pharmacy consulted to initiate and manage heparin infusion after elevation on high sensitive troponin I.  Baseline hgb and plt appropriate to start heparin infusion. Baseline aptt and HL ordered as well.  Goal of Therapy:  Heparin level 0.3-0.7 units/ml Monitor platelets by anticoagulation protocol: Yes  03/23 0246 HL 0.27, subtherapeutic   Plan:  Give 1400 units bolus x 1 Increase heparin infusion to 1600 units/hr Recheck heparin level in 6 hours after rate change Continue to monitor H&H and platelets  Otelia Sergeant, PharmD, [facility] 03/10/2024 3:11 AM

## 2024-03-10 NOTE — Consult Note (Signed)
 PHARMACY - ANTICOAGULATION CONSULT NOTE  Pharmacy Consult for Heparin infusion Indication: chest pain/ACS  No Known Allergies  Patient Measurements: Height: 5\' 9"  (175.3 cm) Weight: 97.5 kg (215 lb) IBW/kg (Calculated) : 70.7 Heparin Dosing Weight: 91.1 kg  Vital Signs: Temp: 97.7 F (36.5 C) (03/23 0733) Temp Source: Oral (03/23 0733) BP: 100/54 (03/23 0830) Pulse Rate: 90 (03/23 0830)  Labs: Recent Labs    03/09/24 1145 03/09/24 1146 03/09/24 1427 03/09/24 1610 03/10/24 0246 03/10/24 0445 03/10/24 0809  HGB 12.4*  --   --   --   --  10.7*  --   HCT 37.3*  --   --   --   --  32.3*  --   PLT 191  --   --   --   --  180  --   APTT  --   --   --  36  --   --   --   HEPARINUNFRC  --   --   --  <0.10* 0.27*  --  0.42  CREATININE 1.58*  --   --   --   --  1.47*  --   TROPONINIHS  --  98* 101*  --   --   --   --     Estimated Creatinine Clearance: 43.8 mL/min (A) (by C-G formula based on SCr of 1.47 mg/dL (H)).  Medications:  Per patient and family, he is NOT on AC anymore. ASA only prior to admission.  Assessment: Patient presents to ED with hx of shortness of breath for 2 weeks, fever, and chills. PMH includes CKD stage IIIa, chronic gout, hypertension, hyperlipidemia, interstitial pulmonary disease, bronchiectasis, and COPD. Pharmacy consulted to initiate and manage heparin infusion after elevation on high sensitive troponin I.  Baseline hgb and plt appropriate to start heparin infusion. Baseline aptt and HL ordered as well.  Goal of Therapy:  Heparin level 0.3-0.7 units/ml Monitor platelets by anticoagulation protocol: Yes  03/23 0246 HL 0.27, subtherapeutic 03/23 0809 HL 0.42,  therapeutic x1   Plan:  continue heparin infusion at 1600 units/hr check confirmatory heparin level in 6 hours  Continue to monitor H&H and platelets daily  Bari Mantis PharmD Clinical Pharmacist 03/10/2024

## 2024-03-10 NOTE — ED Notes (Signed)
 Advised nurse that patient has ready bed

## 2024-03-10 NOTE — Consult Note (Signed)
 Todd Rojas

## 2024-03-10 NOTE — Assessment & Plan Note (Signed)
 S/p bilateral knee arthroplasty. -Supportive care

## 2024-03-10 NOTE — ED Notes (Signed)
 This tech completed full linen and gown change for patient. New sheets, chux, blankets and gown were applied. Patient is resting comfortably with family at bedside.

## 2024-03-10 NOTE — Assessment & Plan Note (Signed)
 Likely with pneumonia, currently requiring 2 L of oxygen with no baseline use. -Continue supplemental oxygen-wean as tolerated

## 2024-03-10 NOTE — Progress Notes (Signed)
 Progress Note   Patient: Todd Rojas ONG:[id number] DOB: 12-Jul-1941 DOA: 03/09/2024     1 DOS: the patient was seen and examined on 03/10/2024   Brief hospital course: Mr. Todd Rojas is an 83 year old male with history of CKD stage IIIa, chronic gout, hypertension, hyperlipidemia, interstitial pulmonary disease, bronchiectasis, COPD, who presents emergency department for chief concerns of fever, chills, shortness of breath for 2 weeks.  Vitals in the ED showed temperature of 98.4, respiration rate 25, heart rate 94, blood pressure initially 96/69 and improved to 107/66, SpO2 initially desatted to 88% on room air and improved to 98% on 2 L nasal cannula.  Serum sodium is 131, potassium 3.7, chloride 99, bicarb 25, BUN of 32, serum creatinine 1.58, EGFR 43, nonfasting blood glucose 131, WBC 7.1, hemoglobin 12.4, platelets of 191. BNP was elevated at 145.6.  High sensitive troponin is 98.  Lactic acid 2.6 and on repeat was 1.3.  Procalcitonin was elevated at 0.31. Chest x-ray with no active cardiopulmonary disease. CT head without any acute abnormality.  CT abdomen with concern of right lower lobe airspace disease which could represent infection or atelectasis in the setting of right hemidiaphragm elevation.  Also noted subtle to hyperenhancing right prostatic nodule which will need outpatient urology evaluation.  Also noted hyperattenuation throughout the bladder.  ED treatment: Ondansetron 4 mg IV one-time dose, cefepime 2 g IV, metronidazole 500 mg IV one-time dose, vancomycin, sodium chloride 1 L bolus.  3/23: Vital stable on 2 L of oxygen, no baseline oxygen use, though CBC stable with some decrease in hemoglobin but all cell lines decreased, hyponatremia improved with sodium at 134, renal function with some improvement to creatinine at 1.47, UA negative for UTI.  Preliminary blood cultures and respiratory viral panels negative.  CT chest with bibasilar atelectasis right greater than left,  possible developing pneumonia in right lower lobe with trace right pleural fluid.  Incidental finding of 2.7 cm low-attenuation left thyroid nodule with recommendations of thyroid ultrasound which was ordered.  Also noted cholelithiasis and enlarged pulmonary trunk indicative of pulmonary arterial hypertension.  Assessment and Plan: * Severe sepsis with acute organ dysfunction (HCC) Elevated lactic acid of 2.6, increased respiration rate, with fever of 102 F at home, organ involvement are cardiac and pulmonary with hypoxic respiratory failure at 88% on room air, elevated high sensitive troponin of 98 and elevated BNP Imaging concerning for right lower lobe pneumonia, although not much upper respiratory symptoms.  Procalcitonin at 0.352. Respiratory panels were negative. Preliminary blood cultures negative UA does not consistent with UTI Patient received broad-spectrum antibiotics -Continue cefepime and discontinue vancomycin and Flagyl -Added Zithromax for atypical coverage -Patient will need a repeat CT chest in 3 to 4 weeks to see the resolution of current abnormalities  Acute hypoxic respiratory failure (HCC) Likely with pneumonia, currently requiring 2 L of oxygen with no baseline use. -Continue supplemental oxygen-wean as tolerated  Fever Patient had 1 set of blood cultures done on 3/19 03/2024: Which was read as no growth, 3 days  Preliminary repeat blood cultures negative in 24-hour. Concern of right lower lobe pneumonia. -Supportive care and antibiotics as listed above  Elevated troponin First high sensitivity troponin was elevated at 98 >>101>.175,  Suspect secondary to demand ischemia in setting of severe sepsis with acute hypoxic respiratory failure Complete echo ordered on admission-pending echo Patient was also experiencing intermittent chest pain over the past couple of months which seems more exertional -Continue with heparin infusion -Cardiology consult as requested  by  family -Follow-up echocardiogram  CAD (coronary artery disease) With history of anterior STEMI in 2006, transferred to [facility] status post cardiac catheterization with PCI to mid LAD using Cypher stent; currently no longer on Plavix Complete echo on 05/17/2022: Estimated ejection fraction is 55%, trivial mitral regurgitation, trivial tricuspid regurgitation, trivial pulmonary valve regurgitation; aortic valve has normal variant Home aspirin 81 mg daily, rosuvastatin 20 mg nightly were resumed on admission  COPD (chronic obstructive pulmonary disease) (HCC) DuoNebs 4 times daily, 3 doses ordered on admission Home long-acting inhaler equivalent resumed on admission  Elevated serum creatinine Does not meet acute kidney injury, suspect prerenal in setting of nausea vomiting, creatinine with some improvement to 1.47 today Serum creatinine baseline is 1.35-1.36/eGFR 52-55, consistent with CKD stage III A -Monitor renal function -Avoid nephrotoxins  Hypertension Blood pressure borderline soft Home furosemide and lisinopril 20 mg daily were not resumed on admission in setting of increase in sCr level from baseline -Continue holding home antihypertensives Hydralazine 5 mg IV every 6 hours as needed for SBP greater 165, 5 days ordered  Hyperlipidemia, unspecified Home rosuvastatin 20 mg nightly resumed  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 -Supportive care  Thyroid nodule greater than or equal to 1.5 cm in diameter incidentally noted on imaging study Incidental finding of a 2.7 cm thyroid nodule. -Thyroid ultrasound -Check thyroid panel with TSH  Personal history of gout Home allopurinol 300 mg daily resumed on admission   Subjective: Patient was seen and examined today.  He was not feeling well.  Not much cough or congestion.  Experiencing nighttime sweats for the past 2 weeks.  Having intermittent chest pain over the past couple of months which seems more  exertional as it comes while walking and relieved with rest. No current chest pain or shortness of breath.  Physical Exam: Vitals:   03/10/24 0830 03/10/24 1037 03/10/24 1130 03/10/24 1230  BP: (!) 100/54 114/64 (!) 103/55 (!) 96/55  Pulse: 90 89 87 84  Resp: (!) 23 16    Temp:      TempSrc:      SpO2: 100% 96% 96% 96%  Weight:      Height:       General.  Well-developed elderly man, in no acute distress. Pulmonary.  Lungs mostly clear bilaterally, normal respiratory effort.  Very few rhonchi on right base. CV.  Regular rate and rhythm, no JVD, rub or murmur. Abdomen.  Soft, nontender, nondistended, BS positive. CNS.  Alert and oriented .  No focal neurologic deficit. Extremities.  No edema, no cyanosis, pulses intact and symmetrical. Psychiatry.  Judgment and insight appears normal.   Data Reviewed: Prior data reviewed  Family Communication: Discussed with wife and 2 daughters at bedside  Disposition: Status is: Inpatient Remains inpatient appropriate because: Severity of illness  Planned Discharge Destination: Home  DVT prophylaxis.  Heparin infusion Time spent: 50 minutes  This record has been created using Conservation officer, historic buildings. Errors have been sought and corrected,but may not always be located. Such creation errors do not reflect on the standard of care.   Author: Arnetha Courser, MD 03/10/2024 12:59 PM  For on call review [email or web address].

## 2024-03-10 NOTE — Assessment & Plan Note (Addendum)
 Incidental finding of a 2.7 cm thyroid nodule. -Thyroid ultrasound -Check thyroid panel with TSH

## 2024-03-11 ENCOUNTER — Inpatient Hospital Stay

## 2024-03-11 ENCOUNTER — Inpatient Hospital Stay
Admit: 2024-03-11 | Discharge: 2024-03-11 | Disposition: A | Discharge status: Home / Self Care | Attending: Internal Medicine

## 2024-03-11 DIAGNOSIS — R509 Fever, unspecified: Secondary | ICD-10-CM | POA: Diagnosis not present

## 2024-03-11 DIAGNOSIS — J9601 Acute respiratory failure with hypoxia: Secondary | ICD-10-CM | POA: Diagnosis not present

## 2024-03-11 DIAGNOSIS — R7989 Other specified abnormal findings of blood chemistry: Secondary | ICD-10-CM | POA: Diagnosis not present

## 2024-03-11 DIAGNOSIS — A419 Sepsis, unspecified organism: Secondary | ICD-10-CM | POA: Diagnosis not present

## 2024-03-11 LAB — ECHOCARDIOGRAM COMPLETE
AR max vel: 2.48 cm2
AV Area VTI: 2.56 cm2
AV Area mean vel: 2.47 cm2
AV Mean grad: 4 mmHg
AV Peak grad: 7.5 mmHg
Ao pk vel: 1.37 m/s
Area-P 1/2: 5.88 cm2
Est EF: 40
Height: 69 in
MV VTI: 2.64 cm2
S' Lateral: 4 cm
Weight: 3440 [oz_av]

## 2024-03-11 LAB — MRSA NEXT GEN BY PCR, NASAL: MRSA by PCR Next Gen: NOT DETECTED

## 2024-03-11 LAB — CBC
HCT: 34 % — ABNORMAL LOW (ref 39.0–52.0)
Hemoglobin: 11.2 g/dL — ABNORMAL LOW (ref 13.0–17.0)
MCH: 31.9 pg (ref 26.0–34.0)
MCHC: 32.9 g/dL (ref 30.0–36.0)
MCV: 96.9 fL (ref 80.0–100.0)
Platelets: 207 10*3/uL (ref 150–400)
RBC: 3.51 MIL/uL — ABNORMAL LOW (ref 4.22–5.81)
RDW: 13.5 % (ref 11.5–15.5)
WBC: 8.1 10*3/uL (ref 4.0–10.5)
nRBC: 0 % (ref 0.0–0.2)

## 2024-03-11 LAB — CULTURE, BLOOD (ROUTINE X 2)
Culture: NO GROWTH
Special Requests: ADEQUATE

## 2024-03-11 LAB — HEPARIN LEVEL (UNFRACTIONATED): Heparin Unfractionated: 0.44 [IU]/mL (ref 0.30–0.70)

## 2024-03-11 LAB — CREATININE, SERUM
Creatinine, Ser: 1.36 mg/dL — ABNORMAL HIGH (ref 0.61–1.24)
GFR, Estimated: 52 mL/min — ABNORMAL LOW (ref >60)

## 2024-03-11 MED ORDER — ALUM & MAG HYDROXIDE-SIMETH 200-200-20 MG/5ML PO SUSP
30.0000 mL | Freq: Once | ORAL | Status: AC
  Administered 2024-03-11: 30 mL via ORAL
  Filled 2024-03-11: qty 30

## 2024-03-11 MED ORDER — PERFLUTREN LIPID MICROSPHERE
1.0000 mL | INTRAVENOUS | Status: AC | PRN
  Administered 2024-03-11: 5 mL via INTRAVENOUS

## 2024-03-11 NOTE — Assessment & Plan Note (Signed)
 Incidental finding of a 2.7 cm thyroid nodule. -Thyroid ultrasound with solitary nodule which required outpatient biopsy -Check thyroid panel with TSH-pending results

## 2024-03-11 NOTE — Progress Notes (Signed)
 Transition of Care [facility]) - Inpatient Brief Assessment   Patient Details  Name: Todd Rojas MRN: [id number] Date of Birth: [birth year]  [facility] CM/SW Contact:    Truddie Hidden, RN Phone Number: 03/11/2024, 10:11 AM   Clinical Narrative: TOC continuing to follow patient's progress throughout discharge planning.   Transition of Care Asessment: Insurance and Status: Insurance coverage has been reviewed Patient has primary care physician: Yes   Prior level of function:: Independent Prior/Current Home Services: No current home services Social Drivers of Health Review: SDOH reviewed no interventions necessary Readmission risk has been reviewed: Yes Transition of care needs: no transition of care needs at this time

## 2024-03-11 NOTE — Progress Notes (Signed)
 [facility] CLINIC CARDIOLOGY PROGRESS NOTE   Patient ID: Todd Rojas MRN: [id number] DOB/AGE: [birth year] 84 y.o.  Admit date: 03/09/2024 Referring Physician Dr. Nelson Chimes Primary Physician Barbette Reichmann, MD  Primary Cardiologist Ander Slade, [place] ([facility]) Reason for Consultation elevated troponin  HPI: Todd Rojas is a 83 y.o. male with a past medical history of CAD (2006 Anterior STEMI-LAD PCI, 2010 LAD PCI, 6/202 LCX, LAD and OM1 PCI), chronic HFrEF, HTN and dyslipidemia  who presented to the ED on 03/09/2024 for fever and SOB. Troponins checked and found to be mildly elevated, cardiology was consulted for further evaluation.   Interval History:  -Patient seen and examined this AM, resting in bed with wife and daughter present at bedside. -Denies any chest pain.  States breathing is improving.  Overall feels better today. -BP and heart rate remained relatively well-controlled.  Review of systems complete and found to be negative unless listed above    Vitals:   03/10/24 2142 03/10/24 2338 03/11/24 0358 03/11/24 0800  BP:  (!) 98/51 (!) 106/56 (!) 116/57  Pulse:  96 83   Resp:   18 18  Temp: 98.3 F (36.8 C) 99.1 F (37.3 C) 98.2 F (36.8 C) 97.9 F (36.6 C)  TempSrc:  Oral Oral Oral  SpO2:  95% 97% 95%  Weight:      Height:         Intake/Output Summary (Last 24 hours) at 03/11/2024 2841 Last data filed at 03/11/2024 0800 Gross per 24 hour  Intake 1347.17 ml  Output 300 ml  Net 1047.17 ml     PHYSICAL EXAM General: Well-appearing elderly male, well nourished, in no acute distress. HEENT: Normocephalic and atraumatic. Neck: No JVD.  Lungs: Normal respiratory effort on 2L [place]. Clear bilaterally to auscultation. No wheezes, crackles, rhonchi.  Heart: HRRR. Normal S1 and S2 without gallops or murmurs. Radial & DP pulses 2+ bilaterally. Abdomen: Non-distended appearing.  Msk: Normal strength and tone for age. Extremities: No clubbing, cyanosis or edema.    Neuro: Alert and oriented X 3. Psych: Mood appropriate, affect congruent.    LABS: Basic Metabolic Panel: Recent Labs    03/09/24 1145 03/10/24 0445 03/11/24 0502  NA 131* 134*  --   K 3.7 3.8  --   CL 99 104  --   CO2 25 24  --   GLUCOSE 131* 120*  --   BUN 32* 24*  --   CREATININE 1.58* 1.47* 1.36*  CALCIUM 8.2* 7.3*  --    Liver Function Tests: Recent Labs    03/09/24 1145  AST 20  ALT 14  ALKPHOS 40  BILITOT 0.9  PROT 6.7  ALBUMIN 2.7*   No results for input(s): "LIPASE", "AMYLASE" in the last 72 hours. CBC: Recent Labs    03/09/24 1145 03/10/24 0445 03/11/24 0502  WBC 7.1 6.8 8.1  NEUTROABS 6.2  --   --   HGB 12.4* 10.7* 11.2*  HCT 37.3* 32.3* 34.0*  MCV 98.2 97.3 96.9  PLT 191 180 207   Cardiac Enzymes: Recent Labs    03/09/24 1427 03/10/24 1049 03/10/24 1338  TROPONINIHS 101* 175* 226*   BNP: Recent Labs    03/09/24 1145  BNP 145.6*   D-Dimer: No results for input(s): "DDIMER" in the last 72 hours. Hemoglobin A1C: No results for input(s): "HGBA1C" in the last 72 hours. Fasting Lipid Panel: No results for input(s): "CHOL", "HDL", "LDLCALC", "TRIG", "CHOLHDL", "LDLDIRECT" in the last 72 hours. Thyroid Function Tests: No results  for input(s): "TSH", "T4TOTAL", "T3FREE", "THYROIDAB" in the last 72 hours.  Invalid input(s): "FREET3" Anemia Panel: No results for input(s): "VITAMINB12", "FOLATE", "FERRITIN", "TIBC", "IRON", "RETICCTPCT" in the last 72 hours.  US THYROID Result Date: 03/10/2024 CLINICAL DATA:  Incidental on CT. EXAM: THYROID ULTRASOUND TECHNIQUE: Ultrasound examination of the thyroid gland and adjacent soft tissues was performed. COMPARISON:  None Available. FINDINGS: Parenchymal Echotexture: Moderately heterogenous Isthmus: 0.4 cm Right lobe: 4.5 x 1.8 x 1.3 cm Left lobe: 5.3 x 2.0 x 1.4 cm _________________________________________________________ Estimated total number of nodules >/= 1 cm: 1 Number of spongiform nodules >/=   2 cm not described below (TR1): 0 Number of mixed cystic and solid nodules >/= 1.5 cm not described below (TR2): 0 _________________________________________________________ Nodule # 1: Solid hypoechoic nodule in the left inferior gland measures 2.2 x 1.5 x 1.6 cm. Margins are relatively well-defined. No calcifications or other suspicious features. Findings are consistent with TI-RADS category 4. **Given size (>/= 1.5 cm) and appearance, fine needle aspiration of this moderately suspicious nodule should be considered based on TI-RADS criteria. IMPRESSION: 1. Solitary 2.2 cm TI-RADS category 4 nodule in the left lower gland meets criteria to consider fine-needle aspiration biopsy. Outpatient biopsy is recommended. The above is in keeping with the ACR TI-RADS recommendations - J Am Coll Radiol 2017;14:587-595. Electronically Signed   By: Malachy Moan M.D.   On: 03/10/2024 12:37   CT CHEST WO CONTRAST Result Date: 03/10/2024 CLINICAL DATA:  Pneumonia, fever, shortness of breath, tachycardia, fever. EXAM: CT CHEST WITHOUT CONTRAST TECHNIQUE: Multidetector CT imaging of the chest was performed following the standard protocol without IV contrast. RADIATION DOSE REDUCTION: This exam was performed according to the departmental dose-optimization program which includes automated exposure control, adjustment of the mA and/or kV according to patient size and/or use of iterative reconstruction technique. COMPARISON:  CT abdomen 03/09/2024 and CT chest 03/08/2024. FINDINGS: Cardiovascular: Atherosclerotic calcification of the aorta, aortic valve and coronary arteries. Enlarged pulmonic trunk and heart. No pericardial effusion. Mediastinum/Nodes: Thoracic inlet lymph nodes are not enlarged by CT size criteria. 2.7 cm low-attenuation left thyroid nodule. Mediastinal lymph nodes measure up to 12 mm in the low right paratracheal station, unchanged. Hilar regions are difficult to definitively evaluate without IV contrast.  Axillary lymph nodes are not enlarged by CT size criteria. Esophagus is grossly unremarkable. Lungs/Pleura: Increased collapse/consolidation in the right lower lobe when compared with 03/08/2024. Increased dependent atelectasis in the left lower lobe. Trace right pleural fluid. Airway is unremarkable. Upper Abdomen: Tiny gallstones. Subcentimeter hyperdense lesion in the interpolar right kidney, too small to characterize. No specific follow-up necessary. Visualized portions of the liver, gallbladder, adrenal glands, kidneys, spleen, pancreas, stomach and bowel are otherwise grossly unremarkable. No upper abdominal adenopathy. Musculoskeletal: Degenerative changes in the spine. IMPRESSION: 1. Increasing bibasilar atelectasis, right greater than left. Possibility of developing pneumonia in the right lower lobe cannot be excluded. 2. Trace right pleural fluid. 3. 2.7 cm low-attenuation left thyroid nodule. Recommend thyroid ultrasound. (Ref: J Am Coll Radiol. 2015 Feb;12(2): 143-50). 4. Cholelithiasis. 5. Aortic atherosclerosis (ICD10-I70.0). Coronary artery calcification. 6. Enlarged pulmonic trunk, indicative of pulmonary arterial hypertension. Electronically Signed   By: Leanna Battles M.D.   On: 03/10/2024 10:10   CT HEAD WO CONTRAST ( ) Result Date: 03/09/2024 CLINICAL DATA:  Headache EXAM: CT HEAD WITHOUT CONTRAST TECHNIQUE: Contiguous axial images were obtained from the base of the skull through the vertex without intravenous contrast. RADIATION DOSE REDUCTION: This exam was performed according to the departmental  dose-optimization program which includes automated exposure control, adjustment of the mA and/or kV according to patient size and/or use of iterative reconstruction technique. COMPARISON:  MRI head 08/09/2007 FINDINGS: Streak artifact limits evaluation likely influenced by patient's arm positioning. Brain: No acute intracranial hemorrhage. No CT evidence of acute infarct. Nonspecific  hypoattenuation in the periventricular and subcortical white matter favored to reflect chronic microvascular ischemic changes. No edema, mass effect, or midline shift. The basilar cisterns are patent. Ventricles: The ventricles are normal. Vascular: Atherosclerotic calcifications of the carotid siphons. No hyperdense vessel. Skull: No acute or aggressive finding. Orbits: Orbits are symmetric. Sinuses: Mild mucosal thickening in the ethmoid sinuses. Other: Mastoid air cells are clear. IMPRESSION: Examination is limited due to streak artifact likely related to positioning of the patient's arms. Within these limitations there is no acute intracranial abnormality appreciated. Mild chronic microvascular ischemic changes. Electronically Signed   By: Emily Filbert M.D.   On: 03/09/2024 15:18   CT ABDOMEN PELVIS W CONTRAST Result Date: 03/09/2024 CLINICAL DATA:  "Abdominal pain" nonlocalized. Chills. Fever. Shortness of breath for 2 weeks. EXAM: CT ABDOMEN AND PELVIS WITH CONTRAST TECHNIQUE: Multidetector CT imaging of the abdomen and pelvis was performed using the standard protocol following bolus administration of intravenous contrast. RADIATION DOSE REDUCTION: This exam was performed according to the departmental dose-optimization program which includes automated exposure control, adjustment of the mA and/or kV according to patient size and/or use of iterative reconstruction technique. CONTRAST:  75mL OMNIPAQUE IOHEXOL 300 MG/ML  SOLN COMPARISON:  07/17/2017 FINDINGS: Lower chest: Emphysema. Patchy right lower lobe airspace disease. Mild cardiomegaly. Right hemidiaphragm elevation. Three vessel coronary artery calcification. Pulmonary artery enlargement, outflow tract 3.4 cm. Hepatobiliary: Too small to characterize segment 2 low-density lesion is most likely a cyst. Normal gallbladder, without biliary ductal dilatation. Pancreas: Mild motion degradation in the mid abdomen. Fatty replaced pancreatic head and uncinate  process. Spleen: Normal in size, without focal abnormality. Adrenals/Urinary Tract: Normal adrenal glands. Lower pole right renal 9 mm cyst. No specific follow-up indicated. Left renal cortical thinning/scarring is mild. No hydronephrosis. Minimal left hydroureter is significantly improved and without acute cause. Presumed contrast throughout the urinary bladder is evidenced by hyperattenuation. Stomach/Bowel: Tiny hiatal hernia. Normal colon, appendix, and terminal ileum. Normal small bowel. Vascular/Lymphatic: Aortic atherosclerosis. Retroaortic left renal vein. No abdominopelvic adenopathy. Reproductive: Moderate prostatomegaly. Right posterior prostatic base 2.5 cm subtle hyperenhancing nodule. Other: No significant free fluid.  No free intraperitoneal air. Musculoskeletal: No acute osseous abnormality. IMPRESSION: 1. Portions of exam are mildly motion degraded. 2. Given this limitation, no acute process in the abdomen or pelvis. 3. Right lower lobe airspace disease is new and could represent infection or atelectasis in the setting of right hemidiaphragm elevation. 4. Subtle hyperenhancing right prostatic nodule. Consider outpatient urology consultation to exclude carcinoma. 5. Hyperattenuation throughout the bladder is most likely early contrast excretion. Correlate with urinalysis to exclude bladder hemorrhage, felt less likely. 6. Incidental findings, including: Tiny hiatal hernia. Coronary artery atherosclerosis. Aortic Atherosclerosis (ICD10-I70.0). 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 Electronically Signed   By: Jeronimo Greaves M.D.   On: 03/09/2024 14:15   DG Chest 2 View Result Date: 03/09/2024 CLINICAL DATA:  Shortness of breath, fever, chills for 2 weeks. EXAM: CHEST - 2 VIEW COMPARISON:  03/06/2024 FINDINGS: The heart size and mediastinal contours are within normal limits. Stable elevated right hemidiaphragm and right basilar scarring. The  lungs are otherwise clear. IMPRESSION: No active cardiopulmonary disease. Electronically Signed   By:  Danae Orleans M.D.   On: 03/09/2024 12:48     ECHO pending  TELEMETRY reviewed by me 03/11/24: Sinus rhythm rate 80s  EKG reviewed by me 03/11/24: sinus tachycardia 1st degree AVB rate 110 bpm  DATA reviewed by me 03/11/24: last 24h vitals tele labs imaging I/O, hospitalist progress note  Principal Problem:   Severe sepsis with acute organ dysfunction (HCC) Active Problems:   CAD (coronary artery disease)   COPD (chronic obstructive pulmonary disease) (HCC)   Dyslipidemia   Hyperlipidemia, unspecified   Hypertension   Personal history of gout   Primary osteoarthritis of both knees   Elevated serum creatinine   Elevated troponin   Fever   Thyroid nodule greater than or equal to 1.5 cm in diameter incidentally noted on imaging study   Acute hypoxic respiratory failure (HCC)    ASSESSMENT AND PLAN: Todd Rojas is a 83 y.o. male with a past medical history of CAD (2006 Anterior STEMI-LAD PCI, 2010 LAD PCI, 6/202 LCX, LAD and OM1 PCI), chronic HFrEF, HTN and dyslipidemia  who presented to the ED on 03/09/2024 for fever and SOB. Troponins checked and found to be mildly elevated, cardiology was consulted for further evaluation.   # Demand ischemia # Coronary artery disease # Severe sepsis Patient with hx of CAD presented to the ED with SOB and fevers, found to be septic and started on IV antibiotics. Troponins checked and found to be elevated, reports pleuritic CP.  -Echo pending -Continue heparin for now. -Home BP meds held given borderline BP and elevated Cr. Resume as able.  -Continue rosuvastatin 20 mg daily and aspirin 81 mg daily.  -Further management of sepsis as per primary team.   This patient's case was discussed and created with Dr. Corky Sing and he is in agreement.  Signed:  Gale Journey, PA-C  03/11/2024, 9:51 AM [facility]

## 2024-03-11 NOTE — Assessment & Plan Note (Signed)
 Does not meet acute kidney injury, suspect prerenal in setting of nausea vomiting, creatinine with some improvement to 1.36 today Serum creatinine baseline is 1.35-1.36/eGFR 52-55, consistent with CKD stage III A -Monitor renal function -Avoid nephrotoxins

## 2024-03-11 NOTE — Consult Note (Signed)
 PHARMACY - ANTICOAGULATION CONSULT NOTE  Pharmacy Consult for Heparin infusion Indication: chest pain/ACS  No Known Allergies  Patient Measurements: Height: 5\' 9"  (175.3 cm) Weight: 97.5 kg (215 lb) IBW/kg (Calculated) : 70.7 Heparin Dosing Weight: 91.1 kg  Vital Signs: Temp: 98.2 F (36.8 C) (03/24 0358) Temp Source: Oral (03/24 0358) BP: 106/56 (03/24 0358) Pulse Rate: 83 (03/24 0358)  Labs: Recent Labs    03/09/24 1145 03/09/24 1146 03/09/24 1427 03/09/24 1610 03/10/24 0246 03/10/24 0445 03/10/24 0809 03/10/24 1049 03/10/24 1338 03/10/24 1518 03/11/24 0502  HGB 12.4*  --   --   --   --  10.7*  --   --   --   --  11.2*  HCT 37.3*  --   --   --   --  32.3*  --   --   --   --  34.0*  PLT 191  --   --   --   --  180  --   --   --   --  207  APTT  --   --   --  36  --   --   --   --   --   --   --   HEPARINUNFRC  --   --   --  <0.10*   < >  --  0.42  --   --  0.49 0.44  CREATININE 1.58*  --   --   --   --  1.47*  --   --   --   --  1.36*  TROPONINIHS  --    < > 101*  --   --   --   --  175* 226*  --   --    < > = values in this interval not displayed.    Estimated Creatinine Clearance: 47.4 mL/min (A) (by C-G formula based on SCr of 1.36 mg/dL (H)).  Medications:  Per patient and family, he is NOT on AC anymore. ASA only prior to admission.  Assessment: Patient presents to ED with hx of shortness of breath for 2 weeks, fever, and chills. PMH includes CKD stage IIIa, chronic gout, hypertension, hyperlipidemia, interstitial pulmonary disease, bronchiectasis, and COPD. Pharmacy consulted to initiate and manage heparin infusion after elevation on high sensitive troponin I.  Baseline hgb and plt appropriate to start heparin infusion. Baseline aptt and HL ordered as well.  Goal of Therapy:  Heparin level 0.3-0.7 units/ml Monitor platelets by anticoagulation protocol: Yes  03/23 0246 HL 0.27, subtherapeutic 03/23 0809 HL 0.42,  therapeutic x1 03/23 1518 HL 0.49,   therapeutic x2 03/24 0502 HL 0.44, therapeutic x 3   Plan:  Continue heparin infusion at 1600 units/hr Check heparin level in AM Monitor daily heparin levels while on heparin infusion Continue to monitor H&H and platelets daily  Thank you for involving pharmacy in this patient's care.   Otelia Sergeant, PharmD, MBA 03/11/2024 6:18 AM

## 2024-03-11 NOTE — Progress Notes (Signed)
 Progress Note   Patient: Todd Rojas ZOX:[id number] DOB: [birth year] DOA: 03/09/2024     2 DOS: the patient was seen and examined on 03/11/2024   Brief hospital course: Mr. Todd Rojas is an 83 year old male with history of CKD stage IIIa, chronic gout, hypertension, hyperlipidemia, interstitial pulmonary disease, bronchiectasis, COPD, who presents emergency department for chief concerns of fever, chills, shortness of breath for 2 weeks.  Vitals in the ED showed temperature of 98.4, respiration rate 25, heart rate 94, blood pressure initially 96/69 and improved to 107/66, SpO2 initially desatted to 88% on room air and improved to 98% on 2 L nasal cannula.  Serum sodium is 131, potassium 3.7, chloride 99, bicarb 25, BUN of 32, serum creatinine 1.58, EGFR 43, nonfasting blood glucose 131, WBC 7.1, hemoglobin 12.4, platelets of 191. BNP was elevated at 145.6.  High sensitive troponin is 98.  Lactic acid 2.6 and on repeat was 1.3.  Procalcitonin was elevated at 0.31. Chest x-ray with no active cardiopulmonary disease. CT head without any acute abnormality.  CT abdomen with concern of right lower lobe airspace disease which could represent infection or atelectasis in the setting of right hemidiaphragm elevation.  Also noted subtle to hyperenhancing right prostatic nodule which will need outpatient urology evaluation.  Also noted hyperattenuation throughout the bladder.  ED treatment: Ondansetron 4 mg IV one-time dose, cefepime 2 g IV, metronidazole 500 mg IV one-time dose, vancomycin, sodium chloride 1 L bolus.  3/23: Vital stable on 2 L of oxygen, no baseline oxygen use, though CBC stable with some decrease in hemoglobin but all cell lines decreased, hyponatremia improved with sodium at 134, renal function with some improvement to creatinine at 1.47, UA negative for UTI.  Preliminary blood cultures and respiratory viral panels negative.  CT chest with bibasilar atelectasis right greater than left,  possible developing pneumonia in right lower lobe with trace right pleural fluid.  Incidental finding of 2.7 cm low-attenuation left thyroid nodule with recommendations of thyroid ultrasound which was ordered.  Also noted cholelithiasis and enlarged pulmonary trunk indicative of pulmonary arterial hypertension.  3/24: Hemodynamically stable, worsening bilateral knee pain so imaging was obtained, right with mild effusion but no other complications noted, left fifth chronic arthroplasty.  Echocardiogram done-pending results Thyroid ultrasound with solid 32.2 cm nodule required outpatient biopsy. Blood cultures remain negative in 2 days  Assessment and Plan: * Severe sepsis with acute organ dysfunction (HCC) Elevated lactic acid of 2.6, increased respiration rate, with fever of 102 F at home, organ involvement are cardiac and pulmonary with hypoxic respiratory failure at 88% on room air, elevated high sensitive troponin of 98 and elevated BNP Imaging concerning for right lower lobe pneumonia, although not much upper respiratory symptoms.  Procalcitonin at 0.352. Respiratory panels were negative. Preliminary blood cultures negative UA does not consistent with UTI Patient received broad-spectrum antibiotics -Continue cefepime and discontinue vancomycin and Flagyl -Added Zithromax for atypical coverage -Patient will need a repeat CT chest in 3 to 4 weeks to see the resolution of current abnormalities  Acute hypoxic respiratory failure (HCC) Likely with pneumonia, currently requiring 2 L of oxygen with no baseline use. -Continue supplemental oxygen-wean as tolerated  Fever Patient had 1 set of blood cultures done on 3/19 03/2024: Which was read as no growth, 3 days Preliminary repeat blood cultures negative in 2 days Concern of right lower lobe pneumonia. -Supportive care and antibiotics as listed above  Elevated troponin First high sensitivity troponin was elevated at 98 >>101>.175>.226,  Suspect secondary to demand ischemia in setting of severe sepsis with acute hypoxic respiratory failure Complete echo ordered on admission-echo done with pending results. Patient was also experiencing intermittent chest pain over the past couple of months which seems more exertional -Continue with heparin infusion -Cardiology consult as requested by family -Follow-up echocardiogram  CAD (coronary artery disease) With history of anterior STEMI in 2006, transferred to [facility] status post cardiac catheterization with PCI to mid LAD using Cypher stent; currently no longer on Plavix Complete echo on 05/17/2022: Estimated ejection fraction is 55%, trivial mitral regurgitation, trivial tricuspid regurgitation, trivial pulmonary valve regurgitation; aortic valve has normal variant Home aspirin 81 mg daily, rosuvastatin 20 mg nightly were resumed on admission  COPD (chronic obstructive pulmonary disease) (HCC) DuoNebs 4 times daily, 3 doses ordered on admission Home long-acting inhaler equivalent resumed on admission  Elevated serum creatinine Does not meet acute kidney injury, suspect prerenal in setting of nausea vomiting, creatinine with some improvement to 1.36 today Serum creatinine baseline is 1.35-1.36/eGFR 52-55, consistent with CKD stage III A -Monitor renal function -Avoid nephrotoxins  Hypertension Blood pressure borderline soft Home furosemide and lisinopril 20 mg daily were not resumed on admission in setting of increase in sCr level from baseline -Continue holding home antihypertensives Hydralazine 5 mg IV every 6 hours as needed for SBP greater 165, 5 days ordered  Hyperlipidemia, unspecified Home rosuvastatin 20 mg nightly resumed  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 Imaging was obtained today which shows very mild right knee effusion and no effusion on left. -Supportive care  Thyroid nodule greater than or equal to 1.5 cm in diameter  incidentally noted on imaging study Incidental finding of a 2.7 cm thyroid nodule. -Thyroid ultrasound with solitary nodule which required outpatient biopsy -Check thyroid panel with TSH-pending results  Personal history of gout Home allopurinol 300 mg daily resumed on admission   Subjective: Patient was complaining of bilateral knee pain.  Physical Exam: Vitals:   03/10/24 2338 03/11/24 0358 03/11/24 0800 03/11/24 1400  BP: (!) 98/51 (!) 106/56 (!) 116/57 (!) 105/53  Pulse: 96 83    Resp:  18 18 20   Temp: 99.1 F (37.3 C) 98.2 F (36.8 C) 97.9 F (36.6 C) 98.3 F (36.8 C)  TempSrc: Oral Oral Oral Oral  SpO2: 95% 97% 95% 98%  Weight:      Height:       General.  Overweight elderly man, in no acute distress. Pulmonary.  Lungs clear bilaterally, normal respiratory effort. CV.  Regular rate and rhythm, no JVD, rub or murmur. Abdomen.  Soft, nontender, nondistended, BS positive. CNS.  Alert and oriented .  No focal neurologic deficit.  Mild edema involving bilateral knee Extremities.  No edema, no cyanosis, pulses intact and symmetrical.   Data Reviewed: Prior data reviewed  Family Communication: Discussed with 2 daughters at bedside  Disposition: Status is: Inpatient Remains inpatient appropriate because: Severity of illness  Planned Discharge Destination: Home  DVT prophylaxis.  Heparin infusion Time spent: 45 minutes  This record has been created using Conservation officer, historic buildings. Errors have been sought and corrected,but may not always be located. Such creation errors do not reflect on the standard of care.   Author: Arnetha Courser, MD 03/11/2024 3:14 PM  For on call review [email or web address].

## 2024-03-11 NOTE — Assessment & Plan Note (Signed)
 Patient had 1 set of blood cultures done on 3/19 03/2024: Which was read as no growth, 3 days Preliminary repeat blood cultures negative in 2 days Concern of right lower lobe pneumonia. -Supportive care and antibiotics as listed above

## 2024-03-11 NOTE — Plan of Care (Signed)

## 2024-03-11 NOTE — Assessment & Plan Note (Signed)
 Likely with pneumonia, currently requiring 2 L of oxygen with no baseline use. -Continue supplemental oxygen-wean as tolerated

## 2024-03-11 NOTE — Assessment & Plan Note (Signed)
 S/p bilateral knee arthroplasty. Imaging was obtained today which shows very mild right knee effusion and no effusion on left. -Supportive care

## 2024-03-11 NOTE — Progress Notes (Signed)
*  PRELIMINARY RESULTS* Echocardiogram 2D Echocardiogram has been performed.  Carolyne Fiscal 03/11/2024, 10:48 AM

## 2024-03-11 NOTE — Assessment & Plan Note (Signed)
 First high sensitivity troponin was elevated at 98 >>101>.175>.226,  Suspect secondary to demand ischemia in setting of severe sepsis with acute hypoxic respiratory failure Echocardiogram with EF of 40% and regional wall motion abnormalities, discussed with cardiology and they think these abnormalities are chronic. Patient was also experiencing intermittent chest pain over the past couple of months which seems more exertional -Continue with heparin infusion-message sent whether to continue or stop -Cardiology consult as requested by family

## 2024-03-11 NOTE — Plan of Care (Signed)

## 2024-03-11 NOTE — Assessment & Plan Note (Signed)
 Elevated lactic acid of 2.6, increased respiration rate, with fever of 102 F at home, organ involvement are cardiac and pulmonary with hypoxic respiratory failure at 88% on room air, elevated high sensitive troponin of 98 and elevated BNP Imaging concerning for right lower lobe pneumonia, although not much upper respiratory symptoms.  Procalcitonin at 0.352. Respiratory panels were negative. Preliminary blood cultures negative UA does not consistent with UTI Patient received broad-spectrum antibiotics -Continue cefepime and discontinue vancomycin and Flagyl -Added Zithromax for atypical coverage -Patient will need a repeat CT chest in 3 to 4 weeks to see the resolution of current abnormalities

## 2024-03-12 ENCOUNTER — Inpatient Hospital Stay

## 2024-03-12 DIAGNOSIS — J9601 Acute respiratory failure with hypoxia: Secondary | ICD-10-CM | POA: Diagnosis not present

## 2024-03-12 DIAGNOSIS — A419 Sepsis, unspecified organism: Secondary | ICD-10-CM | POA: Diagnosis not present

## 2024-03-12 DIAGNOSIS — R509 Fever, unspecified: Secondary | ICD-10-CM | POA: Diagnosis not present

## 2024-03-12 DIAGNOSIS — R7989 Other specified abnormal findings of blood chemistry: Secondary | ICD-10-CM | POA: Diagnosis not present

## 2024-03-12 LAB — CBC
HCT: 34.5 % — ABNORMAL LOW (ref 39.0–52.0)
Hemoglobin: 11.4 g/dL — ABNORMAL LOW (ref 13.0–17.0)
MCH: 32.2 pg (ref 26.0–34.0)
MCHC: 33 g/dL (ref 30.0–36.0)
MCV: 97.5 fL (ref 80.0–100.0)
Platelets: 217 10*3/uL (ref 150–400)
RBC: 3.54 MIL/uL — ABNORMAL LOW (ref 4.22–5.81)
RDW: 13.5 % (ref 11.5–15.5)
WBC: 8.3 10*3/uL (ref 4.0–10.5)
nRBC: 0 % (ref 0.0–0.2)

## 2024-03-12 LAB — HEPARIN LEVEL (UNFRACTIONATED): Heparin Unfractionated: 0.3 [IU]/mL (ref 0.30–0.70)

## 2024-03-12 LAB — THYROID PANEL WITH TSH
Free Thyroxine Index: 1.4 (ref 1.2–4.9)
T3 Uptake Ratio: 38 % (ref 24–39)
T4, Total: 3.6 ug/dL — ABNORMAL LOW (ref 4.5–12.0)
TSH: 2.06 u[IU]/mL (ref 0.450–4.500)

## 2024-03-12 MED ORDER — DICYCLOMINE HCL 10 MG PO CAPS
10.0000 mg | ORAL_CAPSULE | Freq: Three times a day (TID) | ORAL | Status: DC | PRN
  Administered 2024-03-12 – 2024-03-15 (×6): 10 mg via ORAL
  Filled 2024-03-12 (×7): qty 1

## 2024-03-12 MED ORDER — LOPERAMIDE HCL 2 MG PO CAPS
2.0000 mg | ORAL_CAPSULE | ORAL | Status: DC | PRN
  Administered 2024-03-12 (×2): 2 mg via ORAL
  Filled 2024-03-12 (×2): qty 1

## 2024-03-12 NOTE — Care Management Important Message (Signed)
 Important Message  Patient Details  Name: Nicodemus Denk MRN: [id number] Date of Birth: March 08, 1941   Important Message Given:  Yes - Medicare IM     Cristela Blue, CMA 03/12/2024, 11:32 AM

## 2024-03-12 NOTE — Progress Notes (Addendum)
 Progress Note   Patient: Todd Rojas ZOX:[id number] DOB: 05/30/1941 DOA: 03/09/2024     3 DOS: the patient was seen and examined on 03/12/2024   Brief hospital course: Mr. Todd Rojas is an 83 year old male with history of CKD stage IIIa, chronic gout, hypertension, hyperlipidemia, interstitial pulmonary disease, bronchiectasis, COPD, who presents emergency department for chief concerns of fever, chills, shortness of breath for 2 weeks.  Vitals in the ED showed temperature of 98.4, respiration rate 25, heart rate 94, blood pressure initially 96/69 and improved to 107/66, SpO2 initially desatted to 88% on room air and improved to 98% on 2 L nasal cannula.  Serum sodium is 131, potassium 3.7, chloride 99, bicarb 25, BUN of 32, serum creatinine 1.58, EGFR 43, nonfasting blood glucose 131, WBC 7.1, hemoglobin 12.4, platelets of 191. BNP was elevated at 145.6.  High sensitive troponin is 98.  Lactic acid 2.6 and on repeat was 1.3.  Procalcitonin was elevated at 0.31. Chest x-ray with no active cardiopulmonary disease. CT head without any acute abnormality.  CT abdomen with concern of right lower lobe airspace disease which could represent infection or atelectasis in the setting of right hemidiaphragm elevation.  Also noted subtle to hyperenhancing right prostatic nodule which will need outpatient urology evaluation.  Also noted hyperattenuation throughout the bladder.  ED treatment: Ondansetron 4 mg IV one-time dose, cefepime 2 g IV, metronidazole 500 mg IV one-time dose, vancomycin, sodium chloride 1 L bolus.  3/23: Vital stable on 2 L of oxygen, no baseline oxygen use, though CBC stable with some decrease in hemoglobin but all cell lines decreased, hyponatremia improved with sodium at 134, renal function with some improvement to creatinine at 1.47, UA negative for UTI.  Preliminary blood cultures and respiratory viral panels negative.  CT chest with bibasilar atelectasis right greater than left,  possible developing pneumonia in right lower lobe with trace right pleural fluid.  Incidental finding of 2.7 cm low-attenuation left thyroid nodule with recommendations of thyroid ultrasound which was ordered.  Also noted cholelithiasis and enlarged pulmonary trunk indicative of pulmonary arterial hypertension.  3/24: Hemodynamically stable, worsening bilateral knee pain so imaging was obtained, right with mild effusion but no other complications noted, left with chronic arthroplasty.  Echocardiogram done-pending results Thyroid ultrasound with solid 32.2 cm nodule required outpatient biopsy. Blood cultures remain negative in 2 days.  3/25: Vital and labs stable, echocardiogram with EF of 40%, did show regional wall motion abnormalities and grade 1 diastolic dysfunction.  Prior echo done in May 2023 with normal EF.  Case was discussed with cardiology and they think all of those changes were chronic. He was complaining of crampy abdominal pain today.  Couple of loose bowel movements, not watery and no leukocytosis. Repeat CT abdomen was ordered. Family is very frustrated he is not feeling better.  Apparently he was not doing well with a progressive decline over the past few months and when he became febrile with this current pneumonia that really hit him bad.   Assessment and Plan: * Severe sepsis with acute organ dysfunction (HCC) Elevated lactic acid of 2.6, increased respiration rate, with fever of 102 F at home, organ involvement are cardiac and pulmonary with hypoxic respiratory failure at 88% on room air, elevated high sensitive troponin of 98 and elevated BNP Imaging concerning for right lower lobe pneumonia, although not much upper respiratory symptoms.  Procalcitonin at 0.352. Respiratory panels were negative. Preliminary blood cultures negative UA does not consistent with UTI Patient received broad-spectrum  antibiotics -Continue cefepime and discontinue vancomycin and Flagyl -Added  Zithromax for atypical coverage -Patient will need a repeat CT chest in 3 to 4 weeks to see the resolution of current abnormalities  Acute hypoxic respiratory failure (HCC) Likely with pneumonia, currently requiring 2 L of oxygen with no baseline use. -Continue supplemental oxygen-wean as tolerated  Fever Afebrile now. Patient had 1 set of blood cultures done on 3/19 03/2024: Which was read as no growth, 4 days Preliminary repeat blood cultures negative in 3 days Concern of right lower lobe pneumonia. -Supportive care and antibiotics as listed above  Elevated troponin First high sensitivity troponin was elevated at 98 >>101>.175>.226,  Suspect secondary to demand ischemia in setting of severe sepsis with acute hypoxic respiratory failure Echocardiogram with EF of 40% and regional wall motion abnormalities, discussed with cardiology and they think these abnormalities are chronic. Patient was also experiencing intermittent chest pain over the past couple of months which seems more exertional -Continue with heparin infusion-message sent whether to continue or stop -Cardiology consult as requested by family   CAD (coronary artery disease) With history of anterior STEMI in 2006, transferred to [facility] status post cardiac catheterization with PCI to mid LAD using Cypher stent; currently no longer on Plavix Complete echo on 05/17/2022: Estimated ejection fraction is 55%, trivial mitral regurgitation, trivial tricuspid regurgitation, trivial pulmonary valve regurgitation; aortic valve has normal variant Home aspirin 81 mg daily, rosuvastatin 20 mg nightly were resumed on admission  COPD (chronic obstructive pulmonary disease) (HCC) DuoNebs 4 times daily, 3 doses ordered on admission Home long-acting inhaler equivalent resumed on admission  Elevated serum creatinine Does not meet acute kidney injury, suspect prerenal in setting of nausea vomiting, creatinine with some improvement to 1.36  today Serum creatinine baseline is 1.35-1.36/eGFR 52-55, consistent with CKD stage III A -Monitor renal function -Avoid nephrotoxins  Hypertension Blood pressure borderline soft Home furosemide and lisinopril 20 mg daily were not resumed on admission in setting of increase in sCr level from baseline -Continue holding home antihypertensives Hydralazine 5 mg IV every 6 hours as needed for SBP greater 165, 5 days ordered  Hyperlipidemia, unspecified Home rosuvastatin 20 mg nightly resumed  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 Imaging was obtained today which shows very mild right knee effusion and no effusion on left. -Supportive care  Thyroid nodule greater than or equal to 1.5 cm in diameter incidentally noted on imaging study Incidental finding of a 2.7 cm thyroid nodule. -Thyroid ultrasound with solitary nodule which required outpatient biopsy -Check thyroid panel with TSH-pending results  Personal history of gout Home allopurinol 300 mg daily resumed on admission   Subjective: Patient was complaining of abdominal pain and couple of mushy bowel movements, no watery diarrhea.  Physical Exam: Vitals:   03/11/24 2331 03/12/24 0403 03/12/24 0800 03/12/24 1209  BP: (!) 102/57 (!) 117/53 109/63 (!) 117/55  Pulse: 87 86 87 87  Resp: 17     Temp: 98.4 F (36.9 C) 98.2 F (36.8 C) 99.6 F (37.6 C) 98.2 F (36.8 C)  TempSrc: Oral Oral    SpO2: 94% 96% 93% 95%  Weight:      Height:       General.  Ill-appearing, overweight elderly man, in no acute distress. Pulmonary.  Lungs clear bilaterally, normal respiratory effort. CV.  Regular rate and rhythm, no JVD, rub or murmur. Abdomen.  Soft, nontender, nondistended, BS positive. CNS.  Alert and oriented .  No focal neurologic deficit. Extremities.  No edema, no cyanosis, pulses intact and symmetrical.  Data Reviewed: Prior data reviewed  Family Communication: Discussed with 2 daughters at  bedside  Disposition: Status is: Inpatient Remains inpatient appropriate because: Severity of illness  Planned Discharge Destination: Home  DVT prophylaxis.  Heparin infusion Time spent: 44 minutes  This record has been created using Conservation officer, historic buildings. Errors have been sought and corrected,but may not always be located. Such creation errors do not reflect on the standard of care.   Author: Arnetha Courser, MD 03/12/2024 4:13 PM  For on call review [email or web address].

## 2024-03-12 NOTE — Progress Notes (Signed)
 [facility] Cardiology  CARDIOLOGY PROGRESS NOTE  Patient ID: Todd Rojas MRN: [id number] DOB/AGE: [birth year] 83 y.o.  Admit date: 03/09/2024 Referring Physician Dr. Nelson Chimes Primary Physician Barbette Reichmann, MD  Primary Cardiologist Ander Slade, PA ([facility]) Reason for Consultation elevated troponin  HPI: States that he had abdominal pain before which is now improved.  No chest pain/pressure.  Shortness of breath stable.  Wife detailed that patient complained of chest discomfort before which patient agrees with.  Daughter and son at bedside  Review of systems complete and found to be negative unless listed above     Past Medical History:  Diagnosis Date   Actinic keratosis    Arthritis    Asthma    Basal cell carcinoma 03/17/2020   right ant lat deltoid   CAD (coronary artery disease) 02/13/2012   Overview:   12/2004 - Anginal equivalent symptoms:  Left arm pain, weakness, and shortness of breath.   12/2004:  Anterior STEMI.  Transferred to [facility].   01/06/2005 - Cardiac cath:  EF 51%, an occluded mid LAD and a large OM2 with 75% ostial stenosis.  PCI of the mid occluded LAD using a Cypher stent.   03/2005 - 2D echo:  EF >55%, LAE 4.7 cm, no significant valvular disease.   02/2008:  Plavix stopped.   06/2008:  Recurrent angina - symptoms of chest tightness.  Presented to [facility].  Cardiac catheterization.  The patient stated nonobstructive CAD seen.  Plavix restarted at that time.   03/2009 - 2D echo:  EF 35%, akinetic apex and septum.  Stress echocardiogram deferred for cardiac catheterization.   03/30/2009:  PCI 90% proximal LAD with a 3.0 x 18 mm Xience DES.   02/2010: Slow and gradual return of chest tightness, fatigue and generalized weakness concerning for return of anginal equivalent.  Stress test did not show ischemia to the heart muscle during exercise.  Evidenc   CHF (congestive heart failure) (HCC)    Chronic kidney disease    kidney stone left side    COPD (chronic obstructive pulmonary disease) (HCC) 07/03/2017   Dyslipidemia 02/13/2012   Overview:   03/2009  TC 158, TG 88, HDL 39, LDL 102.  11/2013  TC 148, TG 115, HDL 44.3, LDL 80.7 (done at PCP's office)   10/2014 TC 146  11/2015 TC 123, TG 86, HDL 41.7, LDL 64  11/2016 TC 140, TG 129, HDL 40.1, LDL 74    Dyspnea    GERD (gastroesophageal reflux disease)    GI disease 03/09/2013   Overview:   Dysphagia - EGD demonstrated esophageal stricture which was dilated   GERD   Hemorrhoids    Hearing loss 03/09/2013   History of kidney stones    Hyperlipidemia, unspecified 07/03/2017   Hypertension 02/13/2012   Myocardial infarction [facility]) 2004   Personal history of gout 12/09/2016   Primary osteoarthritis of both knees 05/05/2015   Right-sided low back pain without sciatica 04/14/2016   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   R ear - ED&C    Past Surgical History:  Procedure Laterality Date   colonoscopy with polypectomy     COLONOSCOPY WITH PROPOFOL N/A 10/08/2018   Procedure: COLONOSCOPY WITH PROPOFOL;  Surgeon: Scot Jun, MD;  Location: [facility] ENDOSCOPY;  Service: Endoscopy;  Laterality: N/A;   COLONOSCOPY WITH PROPOFOL N/A 01/06/2023   Procedure: COLONOSCOPY WITH PROPOFOL;  Surgeon: Regis Bill, MD;  Location: [facility] ENDOSCOPY;  Service: Endoscopy;  Laterality: N/A;   coronary stents  CYSTOSCOPY WITH STENT PLACEMENT Left 08/09/2017   Procedure: CYSTOSCOPY WITH STENT PLACEMENT;  Surgeon: Hildred Laser, MD;  Location: [facility] ORS;  Service: Urology;  Laterality: Left;   MEDIAL PARTIAL KNEE REPLACEMENT Bilateral 2016   URETEROSCOPY WITH HOLMIUM LASER LITHOTRIPSY Left 08/09/2017   Procedure: URETEROSCOPY WITH HOLMIUM LASER LITHOTRIPSY/INCISION OF URETEROCELE;  Surgeon: Hildred Laser, MD;  Location: [facility] ORS;  Service: Urology;  Laterality: Left;    Medications Prior to Admission  Medication Sig Dispense Refill Last Dose/Taking   acetaminophen (TYLENOL) 500 MG  tablet Take 1,000 mg by mouth every 8 (eight) hours as needed for mild pain.   Taking As Needed   albuterol (PROVENTIL HFA;VENTOLIN HFA) 108 (90 Base) MCG/ACT inhaler Inhale 1 puff into the lungs every 6 (six) hours as needed for wheezing or shortness of breath.   Taking As Needed   allopurinol (ZYLOPRIM) 300 MG tablet Take 300 mg by mouth daily.    03/08/2024 Morning   aspirin EC 81 MG tablet Take 81 mg by mouth daily.   03/08/2024 Morning   [EXPIRED] baclofen (LIORESAL) 10 MG tablet Take 10 mg by mouth 3 (three) times daily as needed for muscle spasms.   Past Week   budesonide-formoterol (SYMBICORT) 160-4.5 MCG/ACT inhaler Inhale into the lungs.   03/08/2024 Evening   colchicine 0.6 MG tablet Take 0.6 mg by mouth daily.   Taking   furosemide (LASIX) 20 MG tablet Take 20 mg by mouth daily.   Past Week   levofloxacin (LEVAQUIN) 500 MG tablet Take 500 mg by mouth daily.   03/08/2024 Morning   lisinopril (ZESTRIL) 20 MG tablet Take 20 mg by mouth daily.   03/08/2024   nitroGLYCERIN (NITROSTAT) 0.4 MG SL tablet Place under the tongue.   Taking   rosuvastatin (CRESTOR) 20 MG tablet Take 20 mg by mouth daily.   03/08/2024 Morning   vitamin B-12 (CYANOCOBALAMIN) 1000 MCG tablet Take 1,000 mcg by mouth daily.   03/08/2024 Morning   gabapentin (NEURONTIN) 300 MG capsule Take 1 capsule by mouth 2 (two) times daily. (Patient not taking: Reported on 03/09/2024)   Not Taking   UNABLE TO FIND Take 100 mg by mouth daily. Study drug: Aspirin 100 mg  Dr Allena Katz @@ [facility] is monitoring this study      VANISHPOINT TUBERCULIN SYRINGE 25G X 1" 1 ML MISC   5    Social History   Socioeconomic History   Marital status: Married    Spouse name: Not on file   Number of children: Not on file   Years of education: Not on file   Highest education level: Not on file  Occupational History   Not on file  Tobacco Use   Smoking status: Former   Smokeless tobacco: Former    Types: Chew    Quit date: 08/07/1994  Vaping Use    Vaping status: Never Used  Substance and Sexual Activity   Alcohol use: No   Drug use: No   Sexual activity: Not Currently  Other Topics Concern   Not on file  Social History Narrative   Not on file   Social Drivers of Health   Financial Resource Strain: Low Risk  (10/23/2023)   Received from [facility]   Overall Financial Resource Strain (CARDIA)    Difficulty of Paying Living Expenses: Not hard at all  Food Insecurity: No Food Insecurity (03/10/2024)   Hunger Vital Sign    Worried About Running Out of Food in the Last Year:  Never true    Ran Out of Food in the Last Year: Never true  Transportation Needs: No Transportation Needs (03/10/2024)   PRAPARE - Administrator, Civil Service (Medical): No    Lack of Transportation (Non-Medical): No  Physical Activity: Not on file  Stress: Not on file  Social Connections: Socially Integrated (03/10/2024)   Social Connection and Isolation Panel [NHANES]    Frequency of Communication with Friends and Family: More than three times a week    Frequency of Social Gatherings with Friends and Family: More than three times a week    Attends Religious Services: More than 4 times per year    Active Member of Clubs or Organizations: Yes    Attends Banker Meetings: More than 4 times per year    Marital Status: Married  Catering manager Violence: Not At Risk (03/10/2024)   Humiliation, Afraid, Rape, and Kick questionnaire    Fear of Current or Ex-Partner: No    Emotionally Abused: No    Physically Abused: No    Sexually Abused: No    Family History  Problem Relation Age of Onset   Alzheimer's disease Mother    Leukemia Father    Prostate cancer Neg Hx    Kidney cancer Neg Hx    Bladder Cancer Neg Hx       Review of systems complete and found to be negative unless listed above      PHYSICAL EXAM  Normal rate and rhythm No significant murmur Trace pedal edema No wheeze Alert and oriented x  3  Labs:   Lab Results  Component Value Date   WBC 8.3 03/12/2024   HGB 11.4 (L) 03/12/2024   HCT 34.5 (L) 03/12/2024   MCV 97.5 03/12/2024   PLT 217 03/12/2024    Recent Labs  Lab 03/09/24 1145 03/10/24 0445 03/11/24 0502  NA 131* 134*  --   K 3.7 3.8  --   CL 99 104  --   CO2 25 24  --   BUN 32* 24*  --   CREATININE 1.58* 1.47* 1.36*  CALCIUM 8.2* 7.3*  --   PROT 6.7  --   --   BILITOT 0.9  --   --   ALKPHOS 40  --   --   ALT 14  --   --   AST 20  --   --   GLUCOSE 131* 120*  --    Lab Results  Component Value Date   TROPONINI 0.10 (HH) 09/08/2017   No results found for: "CHOL" No results found for: "HDL" No results found for: "LDLCALC" No results found for: "TRIG" No results found for: "CHOLHDL" No results found for: "LDLDIRECT"    Radiology: ECHOCARDIOGRAM COMPLETE Result Date: 03/11/2024    ECHOCARDIOGRAM REPORT   Patient Name:   Todd Rojas Date of Exam: 03/11/2024 Medical Rec #:  [id number]            Height:       69.0 in Accession #:    [id number]           Weight:       215.0 lb Date of Birth:  06-23-1941           BSA:          2.130 m Patient Age:    82 years             BP:  116/57 mmHg Patient Gender: M                    HR:           81 bpm. Exam Location:  [facility] Procedure: 2D Echo, 3D Echo, Cardiac Doppler, Color Doppler, Strain Analysis and            Intracardiac Opacification Agent (Both Spectral and Color Flow            Doppler were utilized during procedure). Indications:     Elevated Troponin, Dyspnea  History:         Patient has no prior history of Echocardiogram examinations.                  CAD, COPD, Signs/Symptoms:Dyspnea; Risk Factors:Hypertension                  and Dyslipidemia.  Sonographer:     Mikki Harbor Referring Phys:  [id number] AMY N COX Diagnosing Phys: Windell Norfolk  Sonographer Comments: Technically difficult study due to poor echo windows. Image acquisition challenging due to COPD. Global longitudinal strain  was attempted. IMPRESSIONS  1. Left ventricular ejection fraction, by estimation, is 40%. The left ventricle has moderately decreased function. The left ventricle demonstrates regional wall motion abnormalities (see scoring diagram/findings for description). There is mild left ventricular hypertrophy. Left ventricular diastolic parameters are consistent with Grade I diastolic dysfunction (impaired relaxation).  2. Right ventricular systolic function is normal. The right ventricular size is normal.  3. The mitral valve is normal in structure. Trivial mitral valve regurgitation.  4. The aortic valve is tricuspid. There is mild thickening of the aortic valve. Aortic valve regurgitation is not visualized. FINDINGS  Left Ventricle: Left ventricular ejection fraction, by estimation, is 40%. The left ventricle has moderately decreased function. The left ventricle demonstrates regional wall motion abnormalities. Definity contrast agent was given IV to delineate the left ventricular endocardial borders. The left ventricular internal cavity size was normal in size. There is mild left ventricular hypertrophy. Left ventricular diastolic parameters are consistent with Grade I diastolic dysfunction (impaired relaxation).  LV Wall Scoring: The mid and distal anterior septum and apex are akinetic. The mid and distal anterior wall, basal anteroseptal segment, mid inferoseptal segment, and basal inferior segment are hypokinetic. The entire lateral wall, mid and distal inferior wall, basal anterior segment, and basal inferoseptal segment are normal. Right Ventricle: The right ventricular size is normal. No increase in right ventricular wall thickness. Right ventricular systolic function is normal. Left Atrium: Left atrial size was normal in size. Right Atrium: Right atrial size was normal in size. Pericardium: There is no evidence of pericardial effusion. Mitral Valve: The mitral valve is normal in structure. There is mild thickening  of the mitral valve leaflet(s). Trivial mitral valve regurgitation. MV peak gradient, 4.7 mmHg. The mean mitral valve gradient is 2.0 mmHg. Tricuspid Valve: The tricuspid valve is normal in structure. Tricuspid valve regurgitation is trivial. Aortic Valve: The aortic valve is tricuspid. There is mild thickening of the aortic valve. Aortic valve regurgitation is not visualized. Aortic valve mean gradient measures 4.0 mmHg. Aortic valve peak gradient measures 7.5 mmHg. Aortic valve area, by VTI  measures 2.56 cm. Pulmonic Valve: The pulmonic valve was not well visualized. Pulmonic valve regurgitation is not visualized. Aorta: The aortic root and ascending aorta are structurally normal, with no evidence of dilitation. Venous: The inferior vena cava was not well visualized. IAS/Shunts: The atrial septum is grossly  normal.  LEFT VENTRICLE PLAX 2D LVIDd:         5.30 cm   Diastology LVIDs:         4.00 cm   LV e' medial:    8.59 cm/s LV PW:         1.20 cm   LV E/e' medial:  13.6 LV IVS:        1.60 cm   LV e' lateral:   14.00 cm/s LVOT diam:     2.00 cm   LV E/e' lateral: 8.4 LV SV:         63 LV SV Index:   30 LVOT Area:     3.14 cm  RIGHT VENTRICLE RV Basal diam:  3.95 cm RV Mid diam:    3.40 cm RV S prime:     14.70 cm/s TAPSE (M-mode): 2.1 cm LEFT ATRIUM             Index        RIGHT ATRIUM           Index LA diam:        4.40 cm 2.07 cm/m   RA Area:     12.90 cm LA Vol (A2C):   67.1 ml 31.50 ml/m  RA Volume:   33.90 ml  15.91 ml/m LA Vol (A4C):   57.0 ml 26.75 ml/m LA Biplane Vol: 62.9 ml 29.52 ml/m  AORTIC VALVE                    PULMONIC VALVE AV Area (Vmax):    2.48 cm     PV Vmax:       1.02 m/s AV Area (Vmean):   2.47 cm     PV Peak grad:  4.2 mmHg AV Area (VTI):     2.56 cm AV Vmax:           137.00 cm/s AV Vmean:          89.000 cm/s AV VTI:            0.247 m AV Peak Grad:      7.5 mmHg AV Mean Grad:      4.0 mmHg LVOT Vmax:         108.00 cm/s LVOT Vmean:        70.000 cm/s LVOT VTI:           0.201 m LVOT/AV VTI ratio: 0.81  AORTA Ao Root diam: 3.60 cm Ao Asc diam:  3.40 cm MITRAL VALVE MV Area (PHT): 5.88 cm     SHUNTS MV Area VTI:   2.64 cm     Systemic VTI:  0.20 m MV Peak grad:  4.7 mmHg     Systemic Diam: 2.00 cm MV Mean grad:  2.0 mmHg MV Vmax:       1.08 m/s MV Vmean:      71.1 cm/s MV Decel Time: 129 msec MV E velocity: 117.00 cm/s MV A velocity: 95.90 cm/s MV E/A ratio:  1.22 Windell Norfolk Electronically signed by Windell Norfolk Signature Date/Time: 03/11/2024/4:49:39 PM    Final    DG Knee Complete 4 Views Left Result Date: 03/11/2024 CLINICAL DATA:  Left knee pain. EXAM: LEFT KNEE - COMPLETE 4+ VIEW COMPARISON:  Right knee radiograph dated 03/11/2024. FINDINGS: There is no acute fracture or dislocation. Prior medial compartment arthroplasty. There is mild to moderate arthritic changes. Trace suprapatellar effusion. The soft tissues are unremarkable. IMPRESSION: 1. No acute fracture or dislocation. Mild to  moderate arthritic changes. 2. Medial compartment arthroplasty. Electronically Signed   By: Elgie Collard M.D.   On: 03/11/2024 12:52   DG Knee Complete 4 Views Right Result Date: 03/11/2024 CLINICAL DATA:  Fever, knee pain EXAM: RIGHT KNEE - COMPLETE 4+ VIEW COMPARISON:  06/26/2015 FINDINGS: Prior medial compartment arthroplasty of the right knee. No periprosthetic lucency or fracture. Small knee joint effusion, nonspecific. Mild soft tissue swelling about the knee. IMPRESSION: 1. Prior medial compartment arthroplasty of the right knee without evidence of hardware complication. 2. Small knee joint effusion, nonspecific. Electronically Signed   By: Duanne Guess D.O.   On: 03/11/2024 12:51   US THYROID Result Date: 03/10/2024 CLINICAL DATA:  Incidental on CT. EXAM: THYROID ULTRASOUND TECHNIQUE: Ultrasound examination of the thyroid gland and adjacent soft tissues was performed. COMPARISON:  None Available. FINDINGS: Parenchymal Echotexture: Moderately heterogenous Isthmus:  0.4 cm Right lobe: 4.5 x 1.8 x 1.3 cm Left lobe: 5.3 x 2.0 x 1.4 cm _________________________________________________________ Estimated total number of nodules >/= 1 cm: 1 Number of spongiform nodules >/=  2 cm not described below (TR1): 0 Number of mixed cystic and solid nodules >/= 1.5 cm not described below (TR2): 0 _________________________________________________________ Nodule # 1: Solid hypoechoic nodule in the left inferior gland measures 2.2 x 1.5 x 1.6 cm. Margins are relatively well-defined. No calcifications or other suspicious features. Findings are consistent with TI-RADS category 4. **Given size (>/= 1.5 cm) and appearance, fine needle aspiration of this moderately suspicious nodule should be considered based on TI-RADS criteria. IMPRESSION: 1. Solitary 2.2 cm TI-RADS category 4 nodule in the left lower gland meets criteria to consider fine-needle aspiration biopsy. Outpatient biopsy is recommended. The above is in keeping with the ACR TI-RADS recommendations - J Am Coll Radiol 2017;14:587-595. Electronically Signed   By: Malachy Moan M.D.   On: 03/10/2024 12:37   CT CHEST WO CONTRAST Result Date: 03/10/2024 CLINICAL DATA:  Pneumonia, fever, shortness of breath, tachycardia, fever. EXAM: CT CHEST WITHOUT CONTRAST TECHNIQUE: Multidetector CT imaging of the chest was performed following the standard protocol without IV contrast. RADIATION DOSE REDUCTION: This exam was performed according to the departmental dose-optimization program which includes automated exposure control, adjustment of the mA and/or kV according to patient size and/or use of iterative reconstruction technique. COMPARISON:  CT abdomen 03/09/2024 and CT chest 03/08/2024. FINDINGS: Cardiovascular: Atherosclerotic calcification of the aorta, aortic valve and coronary arteries. Enlarged pulmonic trunk and heart. No pericardial effusion. Mediastinum/Nodes: Thoracic inlet lymph nodes are not enlarged by CT size criteria. 2.7 cm  low-attenuation left thyroid nodule. Mediastinal lymph nodes measure up to 12 mm in the low right paratracheal station, unchanged. Hilar regions are difficult to definitively evaluate without IV contrast. Axillary lymph nodes are not enlarged by CT size criteria. Esophagus is grossly unremarkable. Lungs/Pleura: Increased collapse/consolidation in the right lower lobe when compared with 03/08/2024. Increased dependent atelectasis in the left lower lobe. Trace right pleural fluid. Airway is unremarkable. Upper Abdomen: Tiny gallstones. Subcentimeter hyperdense lesion in the interpolar right kidney, too small to characterize. No specific follow-up necessary. Visualized portions of the liver, gallbladder, adrenal glands, kidneys, spleen, pancreas, stomach and bowel are otherwise grossly unremarkable. No upper abdominal adenopathy. Musculoskeletal: Degenerative changes in the spine. IMPRESSION: 1. Increasing bibasilar atelectasis, right greater than left. Possibility of developing pneumonia in the right lower lobe cannot be excluded. 2. Trace right pleural fluid. 3. 2.7 cm low-attenuation left thyroid nodule. Recommend thyroid ultrasound. (Ref: J Am Coll Radiol. 2015 Feb;12(2): 143-50). 4. Cholelithiasis.  5. Aortic atherosclerosis (ICD10-I70.0). Coronary artery calcification. 6. Enlarged pulmonic trunk, indicative of pulmonary arterial hypertension. Electronically Signed   By: Leanna Battles M.D.   On: 03/10/2024 10:10   CT HEAD WO CONTRAST ( ) Result Date: 03/09/2024 CLINICAL DATA:  Headache EXAM: CT HEAD WITHOUT CONTRAST TECHNIQUE: Contiguous axial images were obtained from the base of the skull through the vertex without intravenous contrast. RADIATION DOSE REDUCTION: This exam was performed according to the departmental dose-optimization program which includes automated exposure control, adjustment of the mA and/or kV according to patient size and/or use of iterative reconstruction technique. COMPARISON:  MRI  head 08/09/2007 FINDINGS: Streak artifact limits evaluation likely influenced by patient's arm positioning. Brain: No acute intracranial hemorrhage. No CT evidence of acute infarct. Nonspecific hypoattenuation in the periventricular and subcortical white matter favored to reflect chronic microvascular ischemic changes. No edema, mass effect, or midline shift. The basilar cisterns are patent. Ventricles: The ventricles are normal. Vascular: Atherosclerotic calcifications of the carotid siphons. No hyperdense vessel. Skull: No acute or aggressive finding. Orbits: Orbits are symmetric. Sinuses: Mild mucosal thickening in the ethmoid sinuses. Other: Mastoid air cells are clear. IMPRESSION: Examination is limited due to streak artifact likely related to positioning of the patient's arms. Within these limitations there is no acute intracranial abnormality appreciated. Mild chronic microvascular ischemic changes. Electronically Signed   By: Emily Filbert M.D.   On: 03/09/2024 15:18   CT ABDOMEN PELVIS W CONTRAST Result Date: 03/09/2024 CLINICAL DATA:  "Abdominal pain" nonlocalized. Chills. Fever. Shortness of breath for 2 weeks. EXAM: CT ABDOMEN AND PELVIS WITH CONTRAST TECHNIQUE: Multidetector CT imaging of the abdomen and pelvis was performed using the standard protocol following bolus administration of intravenous contrast. RADIATION DOSE REDUCTION: This exam was performed according to the departmental dose-optimization program which includes automated exposure control, adjustment of the mA and/or kV according to patient size and/or use of iterative reconstruction technique. CONTRAST:  75mL OMNIPAQUE IOHEXOL 300 MG/ML  SOLN COMPARISON:  07/17/2017 FINDINGS: Lower chest: Emphysema. Patchy right lower lobe airspace disease. Mild cardiomegaly. Right hemidiaphragm elevation. Three vessel coronary artery calcification. Pulmonary artery enlargement, outflow tract 3.4 cm. Hepatobiliary: Too small to characterize segment 2  low-density lesion is most likely a cyst. Normal gallbladder, without biliary ductal dilatation. Pancreas: Mild motion degradation in the mid abdomen. Fatty replaced pancreatic head and uncinate process. Spleen: Normal in size, without focal abnormality. Adrenals/Urinary Tract: Normal adrenal glands. Lower pole right renal 9 mm cyst. No specific follow-up indicated. Left renal cortical thinning/scarring is mild. No hydronephrosis. Minimal left hydroureter is significantly improved and without acute cause. Presumed contrast throughout the urinary bladder is evidenced by hyperattenuation. Stomach/Bowel: Tiny hiatal hernia. Normal colon, appendix, and terminal ileum. Normal small bowel. Vascular/Lymphatic: Aortic atherosclerosis. Retroaortic left renal vein. No abdominopelvic adenopathy. Reproductive: Moderate prostatomegaly. Right posterior prostatic base 2.5 cm subtle hyperenhancing nodule. Other: No significant free fluid.  No free intraperitoneal air. Musculoskeletal: No acute osseous abnormality. IMPRESSION: 1. Portions of exam are mildly motion degraded. 2. Given this limitation, no acute process in the abdomen or pelvis. 3. Right lower lobe airspace disease is new and could represent infection or atelectasis in the setting of right hemidiaphragm elevation. 4. Subtle hyperenhancing right prostatic nodule. Consider outpatient urology consultation to exclude carcinoma. 5. Hyperattenuation throughout the bladder is most likely early contrast excretion. Correlate with urinalysis to exclude bladder hemorrhage, felt less likely. 6. Incidental findings, including: Tiny hiatal hernia. Coronary artery atherosclerosis. Aortic Atherosclerosis (ICD10-I70.0). Emphysema (ICD10-J43.9). Pulmonary artery enlargement suggests pulmonary arterial  hypertension. Electronically Signed   By: Jeronimo Greaves M.D.   On: 03/09/2024 14:15   DG Chest 2 View Result Date: 03/09/2024 CLINICAL DATA:  Shortness of breath, fever, chills for 2  weeks. EXAM: CHEST - 2 VIEW COMPARISON:  03/06/2024 FINDINGS: The heart size and mediastinal contours are within normal limits. Stable elevated right hemidiaphragm and right basilar scarring. The lungs are otherwise clear. IMPRESSION: No active cardiopulmonary disease. Electronically Signed   By: Danae Orleans M.D.   On: 03/09/2024 12:48   CT CHEST W CONTRAST Result Date: 03/07/2024 CLINICAL DATA:  Fever.  Shortness of breath and chills.  Sepsis. EXAM: CT CHEST WITH CONTRAST TECHNIQUE: Multidetector CT imaging of the chest was performed during intravenous contrast administration. RADIATION DOSE REDUCTION: This exam was performed according to the departmental dose-optimization program which includes automated exposure control, adjustment of the mA and/or kV according to patient size and/or use of iterative reconstruction technique. CONTRAST:  75mL OMNIPAQUE IOHEXOL 300 MG/ML  SOLN COMPARISON:  Chest radiograph dated 03/06/2024 and CT dated 05/23/2023. FINDINGS: Cardiovascular: There is no cardiomegaly or pericardial effusion. Advanced 3 vessel coronary vascular calcification. Mild atherosclerotic calcification of the thoracic aorta. No aneurysmal dilatation or dissection. The origins of the great vessels of the aortic arch and the central pulmonary arteries appear patent. Mediastinum/Nodes: No hilar or mediastinal adenopathy. Small hiatal hernia. The esophagus is grossly unremarkable. A 2.5 cm left thyroid hypodense nodule. Recommend thyroid US (ref: J Am Coll Radiol. 2015 Feb;12(2): 143-50).No mediastinal fluid collection. Lungs/Pleura: There is eventration of the right hemidiaphragm with right lung base atelectasis. Pneumonia is less likely but not excluded. No pleural effusion or pneumothorax. The central airways are patent. Upper Abdomen: No acute abnormality. Musculoskeletal: Osteopenia degenerative changes of the spine. No acute osseous pathology. IMPRESSION: 1. Eventration of the right hemidiaphragm with  right lung base atelectasis. Pneumonia is less likely but not excluded. 2. Advanced coronary vascular calcification. 3. A 2.5 cm left thyroid hypodense nodule. Recommend thyroid US. Electronically Signed   By: Elgie Collard M.D.   On: 03/07/2024 14:56   DG Chest 2 View Result Date: 03/06/2024 CLINICAL DATA:  Sepsis, fever and chills, short of breath for 2 weeks EXAM: CHEST - 2 VIEW COMPARISON:  05/23/2023 FINDINGS: Frontal and lateral views of the chest demonstrate a stable cardiac silhouette. Chronic elevation the right hemidiaphragm. Areas of subsegmental atelectasis or scarring at the lung bases. No airspace disease, effusion, or pneumothorax. No acute bony abnormalities. IMPRESSION: 1. Stable chest, no acute process. Electronically Signed   By: Sharlet Salina M.D.   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 Echocardiogram with reduced LVEF of 40% with wall motion abnormalities.  Last echocardiogram from 2023 reported LVEF of 55% with wall motion abnormalities.  Although on my review this echo is of limited quality and it appears to have some wall motion abnormalities with probable LVEF of about 45%.  Echo from 2020/2021 showed reduced LVEF of 40% with wall motion abnormalities.  Believe his echo findings are not new. Patient's wife details that he had chest pain symptoms before.  I had lengthy discussion with patient/wife, daughter at bedside.  Considering his symptoms of chest discomfort before, increasing shortness of breath over the last month-we can consider stress test to look for any  significant ischemia once he overall is better. Currently having abdominal pain issues and pending CT scan Continue current medical therapy with aspirin, statin.  Can stop  heparin drip  Signed: Kathryne Gin MD 03/12/2024, 5:50 PM

## 2024-03-12 NOTE — Consult Note (Signed)
 PHARMACY - ANTICOAGULATION CONSULT NOTE  Pharmacy Consult for Heparin infusion Indication: chest pain/ACS  No Known Allergies  Patient Measurements: Height: 5\' 9"  (175.3 cm) Weight: 97.5 kg (215 lb) IBW/kg (Calculated) : 70.7 Heparin Dosing Weight: 91.1 kg  Vital Signs: Temp: 98.2 F (36.8 C) (03/25 0403) Temp Source: Oral (03/25 0403) BP: 117/53 (03/25 0403) Pulse Rate: 86 (03/25 0403)  Labs: Recent Labs    03/09/24 1145 03/09/24 1146 03/09/24 1427 03/09/24 1610 03/10/24 0246 03/10/24 0445 03/10/24 0809 03/10/24 1049 03/10/24 1338 03/10/24 1518 03/11/24 0502 03/12/24 0424  HGB 12.4*  --   --   --   --  10.7*  --   --   --   --  11.2* 11.4*  HCT 37.3*  --   --   --   --  32.3*  --   --   --   --  34.0* 34.5*  PLT 191  --   --   --   --  180  --   --   --   --  207 217  APTT  --   --   --  36  --   --   --   --   --   --   --   --   HEPARINUNFRC  --   --   --  <0.10*   < >  --    < >  --   --  0.49 0.44 0.30  CREATININE 1.58*  --   --   --   --  1.47*  --   --   --   --  1.36*  --   TROPONINIHS  --    < > 101*  --   --   --   --  175* 226*  --   --   --    < > = values in this interval not displayed.    Estimated Creatinine Clearance: 47.4 mL/min (A) (by C-G formula based on SCr of 1.36 mg/dL (H)).  Medications:  Per patient and family, he is NOT on AC anymore. ASA only prior to admission.  Assessment: Patient presents to ED with hx of shortness of breath for 2 weeks, fever, and chills. PMH includes CKD stage IIIa, chronic gout, hypertension, hyperlipidemia, interstitial pulmonary disease, bronchiectasis, and COPD. Pharmacy consulted to initiate and manage heparin infusion after elevation on high sensitive troponin I.  Baseline hgb and plt appropriate to start heparin infusion. Baseline aptt and HL ordered as well.  Goal of Therapy:  Heparin level 0.3-0.7 units/ml Monitor platelets by anticoagulation protocol: Yes  03/23 0246 HL 0.27, subtherapeutic 03/23  0809 HL 0.42,  therapeutic x1 03/23 1518 HL 0.49,  therapeutic x2 03/24 0502 HL 0.44, therapeutic x 3 03/25 0424 HL 0.30, therapeutic X 4    Plan:  Continue heparin infusion at 1600 units/hr Check heparin level in AM Monitor daily heparin levels while on heparin infusion Continue to monitor H&H and platelets daily  Thank you for involving pharmacy in this patient's care.   Floy Angert D 03/12/2024 6:05 AM

## 2024-03-12 NOTE — Assessment & Plan Note (Signed)
 Home furosemide and lisinopril 20 mg daily were not resumed on admission in setting of increase in sCr level from baseline AM team to resume when the benefits outweigh the risk Hydralazine 5 mg IV every 6 hours as needed for SBP greater 165, 5 days ordered

## 2024-03-12 NOTE — Progress Notes (Signed)
 OT Cancellation Note  Patient Details Name: Che Rachal MRN: [id number] DOB: 11-Jul-1941   Cancelled Treatment:    Reason Eval/Treat Not Completed: Fatigue/lethargy limiting ability to participate;Pain limiting ability to participate. Will re-attempt OT evaluation next date.   Arman Filter., MPH, MS, OTR/L ascom [phone] 03/12/24, 4:03 PM

## 2024-03-12 NOTE — Plan of Care (Signed)
  Problem: Fluid Volume: Goal: Hemodynamic stability will improve Outcome: Progressing   Problem: Clinical Measurements: Goal: Diagnostic test results will improve Outcome: Progressing   Problem: Respiratory: Goal: Ability to maintain adequate ventilation will improve Outcome: Progressing   Problem: Skin Integrity: Goal: Risk for impaired skin integrity will decrease Outcome: Progressing

## 2024-03-12 NOTE — Evaluation (Signed)
 Physical Therapy Evaluation Patient Details Name: Todd Rojas MRN: [id number] DOB: 09/27/1941 Today's Date: 03/12/2024  History of Present Illness  Pt is an 83 year old male with history of CKD stage IIIa, chronic gout, hypertension, hyperlipidemia, interstitial pulmonary disease, bronchiectasis, COPD, who presents emergency department for chief concerns of fever, chills, shortness of breath for 2 weeks. Workup for sepsis, PNA, elevated troponin (demand ischemia).   Clinical Impression  Patient alert, agreeable to PT with encouragement due to ongoing abdominal pain, 10/10. RN had just administered morphine per family report. At  baseline the pt is independent, lives with his wife, no falls in the last 6 months.   Extra time needed due to elevated pain and pt selected rest breaks. Supine <> sit minA, fair sitting balance. HR and spO2 WFLs, pt did report light headedness that improved with time. Sit <> stand with minA and RW, lift assistance and steadying assistance needed. He was able to step forwards/backwards/laterally but fatigued very quickly. Pt also performed a few supine therex with AAROM, family/pt encouraged to perform during his hospital stay.  Overall the patient demonstrated deficits (see "PT Problem List") that impede the patient's functional abilities, safety, and mobility and would benefit from skilled PT intervention.          If plan is discharge home, recommend the following: A lot of help with walking and/or transfers;A lot of help with bathing/dressing/bathroom;Assistance with cooking/housework;Assist for transportation;Help with stairs or ramp for entrance;Direct supervision/assist for medications management   Can travel by private vehicle   Yes    Equipment Recommendations Rolling walker (2 wheels)  Recommendations for Other Services       Functional Status Assessment Patient has had a recent decline in their functional status and demonstrates the ability to  make significant improvements in function in a reasonable and predictable amount of time.     Precautions / Restrictions Precautions Precautions: Fall Recall of Precautions/Restrictions: Intact Restrictions Weight Bearing Restrictions Per Provider Order: No      Mobility  Bed Mobility Overal bed mobility: Needs Assistance Bed Mobility: Supine to Sit, Sit to Supine     Supine to sit: Min assist, Used rails, HOB elevated Sit to supine: Min assist        Transfers Overall transfer level: Needs assistance Equipment used: Rolling walker (2 wheels) Transfers: Sit to/from Stand Sit to Stand: Min assist           General transfer comment: minA for lift and steadying    Ambulation/Gait Ambulation/Gait assistance: Min assist Gait Distance (Feet): 5 Feet Assistive device: Rolling walker (2 wheels)         General Gait Details: able to step forwards/backwards, laterally at EOB. pt main limiting factor was pain  Stairs            Wheelchair Mobility     Tilt Bed    Modified Rankin (Stroke Patients Only)       Balance Overall balance assessment: Needs assistance Sitting-balance support: Feet supported, Bilateral upper extremity supported Sitting balance-Leahy Scale: Fair     Standing balance support: Bilateral upper extremity supported, Reliant on assistive device for balance Standing balance-Leahy Scale: Poor                               Pertinent Vitals/Pain Pain Assessment Pain Assessment: 0-10 Pain Score: 10-Worst pain ever Pain Location: abdominal pain Pain Descriptors / Indicators: Aching, Grimacing, Guarding, Sore Pain Intervention(s): Limited activity within  patient's tolerance, Monitored during session, Repositioned    Home Living Family/patient expects to be discharged to:: Private residence Living Arrangements: Spouse/significant other Available Help at Discharge: Family Type of Home: House Home Access: Stairs to  enter Entrance Stairs-Rails: None Entrance Stairs-Number of Steps: 3   Home Layout: One level Home Equipment: Agricultural consultant (2 wheels);Cane - single point      Prior Function Prior Level of Function : Independent/Modified Independent                     Extremity/Trunk Assessment   Upper Extremity Assessment Upper Extremity Assessment: Generalized weakness    Lower Extremity Assessment Lower Extremity Assessment: Generalized weakness (able to move BLE against gravity, LLE with assistance due to L knee pain)       Communication   Communication Factors Affecting Communication: Hearing impaired    Cognition Arousal: Alert Behavior During Therapy: WFL for tasks assessed/performed, Anxious   PT - Cognitive impairments: No apparent impairments                         Following commands: Intact       Cueing       General Comments      Exercises     Assessment/Plan    PT Assessment Patient needs continued PT services  PT Problem List Decreased strength;Decreased activity tolerance;Decreased range of motion;Decreased balance;Decreased mobility;Decreased knowledge of use of DME       PT Treatment Interventions DME instruction;Balance training;Gait training;Neuromuscular re-education;Stair training;Functional mobility training;Patient/family education;Therapeutic activities;Therapeutic exercise    PT Goals (Current goals can be found in the Care Plan section)  Acute Rehab PT Goals Patient Stated Goal: to feel better PT Goal Formulation: With patient Time For Goal Achievement: 03/26/24 Potential to Achieve Goals: Good    Frequency Min 2X/week     Co-evaluation               AM-PAC PT "6 Clicks" Mobility  Outcome Measure Help needed turning from your back to your side while in a flat bed without using bedrails?: A Lot Help needed moving from lying on your back to sitting on the side of a flat bed without using bedrails?: A Lot Help  needed moving to and from a bed to a chair (including a wheelchair)?: A Little Help needed standing up from a chair using your arms (e.g., wheelchair or bedside chair)?: A Little Help needed to walk in hospital room?: A Lot Help needed climbing 3-5 steps with a railing? : Total 6 Click Score: 13    End of Session   Activity Tolerance: Patient limited by pain Patient left: in bed;with call bell/phone within reach;with bed alarm set Nurse Communication: Mobility status PT Visit Diagnosis: Other abnormalities of gait and mobility (R26.89);Difficulty in walking, not elsewhere classified (R26.2);Muscle weakness (generalized) (M62.81)    Time: 1610-9604 PT Time Calculation (min) (ACUTE ONLY): 24 min   Charges:   PT Evaluation $PT Eval Low Complexity: 1 Low PT Treatments $Therapeutic Activity: 8-22 mins PT General Charges $$ ACUTE PT VISIT: 1 Visit         Olga Coaster PT, DPT 3:34 PM,03/12/24

## 2024-03-13 ENCOUNTER — Inpatient Hospital Stay

## 2024-03-13 DIAGNOSIS — R7989 Other specified abnormal findings of blood chemistry: Secondary | ICD-10-CM | POA: Diagnosis not present

## 2024-03-13 DIAGNOSIS — A419 Sepsis, unspecified organism: Secondary | ICD-10-CM | POA: Diagnosis not present

## 2024-03-13 DIAGNOSIS — R509 Fever, unspecified: Secondary | ICD-10-CM | POA: Diagnosis not present

## 2024-03-13 DIAGNOSIS — J9601 Acute respiratory failure with hypoxia: Secondary | ICD-10-CM | POA: Diagnosis not present

## 2024-03-13 DIAGNOSIS — R140 Abdominal distension (gaseous): Secondary | ICD-10-CM

## 2024-03-13 LAB — BASIC METABOLIC PANEL
Anion gap: 5 (ref 5–15)
BUN: 23 mg/dL (ref 8–23)
CO2: 24 mmol/L (ref 22–32)
Calcium: 7.9 mg/dL — ABNORMAL LOW (ref 8.9–10.3)
Chloride: 102 mmol/L (ref 98–111)
Creatinine, Ser: 1.27 mg/dL — ABNORMAL HIGH (ref 0.61–1.24)
GFR, Estimated: 56 mL/min — ABNORMAL LOW (ref >60)
Glucose, Bld: 102 mg/dL — ABNORMAL HIGH (ref 70–99)
Potassium: 3.9 mmol/L (ref 3.5–5.1)
Sodium: 131 mmol/L — ABNORMAL LOW (ref 135–145)

## 2024-03-13 LAB — CBC
HCT: 31.6 % — ABNORMAL LOW (ref 39.0–52.0)
Hemoglobin: 10.4 g/dL — ABNORMAL LOW (ref 13.0–17.0)
MCH: 32.8 pg (ref 26.0–34.0)
MCHC: 32.9 g/dL (ref 30.0–36.0)
MCV: 99.7 fL (ref 80.0–100.0)
Platelets: 220 10*3/uL (ref 150–400)
RBC: 3.17 MIL/uL — ABNORMAL LOW (ref 4.22–5.81)
RDW: 13.5 % (ref 11.5–15.5)
WBC: 8.8 10*3/uL (ref 4.0–10.5)
nRBC: 0 % (ref 0.0–0.2)

## 2024-03-13 MED ORDER — ENOXAPARIN SODIUM 60 MG/0.6ML IJ SOSY
50.0000 mg | PREFILLED_SYRINGE | INTRAMUSCULAR | Status: DC
  Administered 2024-03-13 – 2024-03-16 (×4): 50 mg via SUBCUTANEOUS
  Filled 2024-03-13 (×4): qty 0.6

## 2024-03-13 MED ORDER — LEVOFLOXACIN 500 MG PO TABS
750.0000 mg | ORAL_TABLET | Freq: Every day | ORAL | Status: AC
  Administered 2024-03-13 – 2024-03-15 (×3): 750 mg via ORAL
  Filled 2024-03-13 (×3): qty 2

## 2024-03-13 NOTE — Progress Notes (Signed)
 [facility] CLINIC CARDIOLOGY PROGRESS NOTE   Patient ID: Todd Rojas MRN: [id number] DOB/AGE: [birth year] 84 y.o.  Admit date: 03/09/2024 Referring Physician Dr. Nelson Chimes Primary Physician Barbette Reichmann, MD  Primary Cardiologist Ander Slade, [place] ([facility]) Reason for Consultation elevated troponin  HPI: Todd Rojas is a 83 y.o. male with a past medical history of CAD (2006 Anterior STEMI-LAD PCI, 2010 LAD PCI, 6/202 LCX, LAD and OM1 PCI), chronic HFrEF, HTN and dyslipidemia  who presented to the ED on 03/09/2024 for fever and SOB. Troponins checked and found to be mildly elevated, cardiology was consulted for further evaluation.   Interval History:  -Patient seen and examined this AM, in bedside chair with wife present. -Expresses abdominal pain and distention.  Abdomen is visibly distended.  Denies any chest pain. -BP and heart rate remain controlled.  Review of systems complete and found to be negative unless listed above    Vitals:   03/12/24 2012 03/13/24 0101 03/13/24 0428 03/13/24 0808  BP: (!) 115/57 (!) 125/56 (!) 125/58 (!) 126/55  Pulse: 87 92 93 92  Resp: 16 20 20 20   Temp: 98.9 F (37.2 C) 98.7 F (37.1 C) 98.9 F (37.2 C) 98.9 F (37.2 C)  TempSrc: Oral Oral Oral   SpO2: 96% 98% 95% 94%  Weight:      Height:         Intake/Output Summary (Last 24 hours) at 03/13/2024 4098 Last data filed at 03/13/2024 0459 Gross per 24 hour  Intake 1020 ml  Output 850 ml  Net 170 ml     PHYSICAL EXAM General: Well-appearing elderly male, well nourished, in no acute distress. HEENT: Normocephalic and atraumatic. Neck: No JVD.  Lungs: Normal respiratory effort on 2L [place]. Clear bilaterally to auscultation. No wheezes, crackles, rhonchi.  Heart: HRRR. Normal S1 and S2 without gallops or murmurs. Radial & DP pulses 2+ bilaterally. Abdomen: Distended appearing.  Msk: Normal strength and tone for age. Extremities: No clubbing, cyanosis or edema.   Neuro: Alert and  oriented X 3. Psych: Mood appropriate, affect congruent.    LABS: Basic Metabolic Panel: Recent Labs    03/11/24 0502 03/13/24 0412  NA  --  131*  K  --  3.9  CL  --  102  CO2  --  24  GLUCOSE  --  102*  BUN  --  23  CREATININE 1.36* 1.27*  CALCIUM  --  7.9*   Liver Function Tests: No results for input(s): "AST", "ALT", "ALKPHOS", "BILITOT", "PROT", "ALBUMIN" in the last 72 hours.  No results for input(s): "LIPASE", "AMYLASE" in the last 72 hours. CBC: Recent Labs    03/12/24 0424 03/13/24 0412  WBC 8.3 8.8  HGB 11.4* 10.4*  HCT 34.5* 31.6*  MCV 97.5 99.7  PLT 217 220   Cardiac Enzymes: Recent Labs    03/10/24 1049 03/10/24 1338  TROPONINIHS 175* 226*   BNP: No results for input(s): "BNP" in the last 72 hours.  D-Dimer: No results for input(s): "DDIMER" in the last 72 hours. Hemoglobin A1C: No results for input(s): "HGBA1C" in the last 72 hours. Fasting Lipid Panel: No results for input(s): "CHOL", "HDL", "LDLCALC", "TRIG", "CHOLHDL", "LDLDIRECT" in the last 72 hours. Thyroid Function Tests: Recent Labs    03/10/24 1338  TSH 2.060  T4TOTAL 3.6*   Anemia Panel: No results for input(s): "VITAMINB12", "FOLATE", "FERRITIN", "TIBC", "IRON", "RETICCTPCT" in the last 72 hours.  CT ABDOMEN PELVIS WO CONTRAST Result Date: 03/12/2024 CLINICAL DATA:  Acute abdominal  pain EXAM: CT ABDOMEN AND PELVIS WITHOUT CONTRAST TECHNIQUE: Multidetector CT imaging of the abdomen and pelvis was performed following the standard protocol without IV contrast. RADIATION DOSE REDUCTION: This exam was performed according to the departmental dose-optimization program which includes automated exposure control, adjustment of the mA and/or kV according to patient size and/or use of iterative reconstruction technique. COMPARISON:  03/09/2024 FINDINGS: Lower chest: Lung bases demonstrate mild atelectatic changes slightly progressed in the interval from the prior exam. No sizable effusion is  noted. Hepatobiliary: Liver is within normal limits. Gallbladder demonstrates dependent density which may be related to vicarious excretion of contrast. No definitive calculi are seen. Pancreas: Unremarkable. No pancreatic ductal dilatation or surrounding inflammatory changes. Spleen: Normal in size without focal abnormality. Adrenals/Urinary Tract: Adrenal glands are within normal limits. Kidneys show mild scarring on the left. Stable right renal cyst is noted. No follow-up is recommended. No renal calculi or obstructive changes are noted. The bladder is well distended. Stomach/Bowel: Scattered fecal material is noted throughout the colon. Mild gaseous distension of the colon is seen without obstructive change. This may contribute to the patient's underlying discomfort. The appendix is not well visualized. No inflammatory changes to suggest appendicitis are noted. Stomach is within normal limits. Small bowel shows no obstructive change. Vascular/Lymphatic: Aortic atherosclerosis. No enlarged abdominal or pelvic lymph nodes. Reproductive: Prostate is unremarkable. Other: No abdominal wall hernia or abnormality. No abdominopelvic ascites. Musculoskeletal: No acute or significant osseous findings. IMPRESSION: Slight increase in gaseous distension of the colon without obstructive lesion. This could represent an early colonic ileus. Mild dependent density within the gallbladder which may represent vicarious excretion of contrast. Slight progression in basilar atelectasis. Electronically Signed   By: Alcide Clever M.D.   On: 03/12/2024 20:16   ECHOCARDIOGRAM COMPLETE Result Date: 03/11/2024    ECHOCARDIOGRAM REPORT   Patient Name:   Todd Rojas Date of Exam: 03/11/2024 Medical Rec #:  [id number]            Height:       69.0 in Accession #:    [id number]           Weight:       215.0 lb Date of Birth:  05-12-1941           BSA:          2.130 m Patient Age:    83 years             BP:           116/57 mmHg  Patient Gender: M                    HR:           81 bpm. Exam Location:  [facility] Procedure: 2D Echo, 3D Echo, Cardiac Doppler, Color Doppler, Strain Analysis and            Intracardiac Opacification Agent (Both Spectral and Color Flow            Doppler were utilized during procedure). Indications:     Elevated Troponin, Dyspnea  History:         Patient has no prior history of Echocardiogram examinations.                  CAD, COPD, Signs/Symptoms:Dyspnea; Risk Factors:Hypertension                  and Dyslipidemia.  Sonographer:     Mikki Harbor Referring Phys:  [id number] AMY N COX Diagnosing Phys: Windell Norfolk  Sonographer Comments: Technically difficult study due to poor echo windows. Image acquisition challenging due to COPD. Global longitudinal strain was attempted. IMPRESSIONS  1. Left ventricular ejection fraction, by estimation, is 40%. The left ventricle has moderately decreased function. The left ventricle demonstrates regional wall motion abnormalities (see scoring diagram/findings for description). There is mild left ventricular hypertrophy. Left ventricular diastolic parameters are consistent with Grade I diastolic dysfunction (impaired relaxation).  2. Right ventricular systolic function is normal. The right ventricular size is normal.  3. The mitral valve is normal in structure. Trivial mitral valve regurgitation.  4. The aortic valve is tricuspid. There is mild thickening of the aortic valve. Aortic valve regurgitation is not visualized. FINDINGS  Left Ventricle: Left ventricular ejection fraction, by estimation, is 40%. The left ventricle has moderately decreased function. The left ventricle demonstrates regional wall motion abnormalities. Definity contrast agent was given IV to delineate the left ventricular endocardial borders. The left ventricular internal cavity size was normal in size. There is mild left ventricular hypertrophy. Left ventricular diastolic parameters are consistent with  Grade I diastolic dysfunction (impaired relaxation).  LV Wall Scoring: The mid and distal anterior septum and apex are akinetic. The mid and distal anterior wall, basal anteroseptal segment, mid inferoseptal segment, and basal inferior segment are hypokinetic. The entire lateral wall, mid and distal inferior wall, basal anterior segment, and basal inferoseptal segment are normal. Right Ventricle: The right ventricular size is normal. No increase in right ventricular wall thickness. Right ventricular systolic function is normal. Left Atrium: Left atrial size was normal in size. Right Atrium: Right atrial size was normal in size. Pericardium: There is no evidence of pericardial effusion. Mitral Valve: The mitral valve is normal in structure. There is mild thickening of the mitral valve leaflet(s). Trivial mitral valve regurgitation. MV peak gradient, 4.7 mmHg. The mean mitral valve gradient is 2.0 mmHg. Tricuspid Valve: The tricuspid valve is normal in structure. Tricuspid valve regurgitation is trivial. Aortic Valve: The aortic valve is tricuspid. There is mild thickening of the aortic valve. Aortic valve regurgitation is not visualized. Aortic valve mean gradient measures 4.0 mmHg. Aortic valve peak gradient measures 7.5 mmHg. Aortic valve area, by VTI  measures 2.56 cm. Pulmonic Valve: The pulmonic valve was not well visualized. Pulmonic valve regurgitation is not visualized. Aorta: The aortic root and ascending aorta are structurally normal, with no evidence of dilitation. Venous: The inferior vena cava was not well visualized. IAS/Shunts: The atrial septum is grossly normal.  LEFT VENTRICLE PLAX 2D LVIDd:         5.30 cm   Diastology LVIDs:         4.00 cm   LV e' medial:    8.59 cm/s LV PW:         1.20 cm   LV E/e' medial:  13.6 LV IVS:        1.60 cm   LV e' lateral:   14.00 cm/s LVOT diam:     2.00 cm   LV E/e' lateral: 8.4 LV SV:         63 LV SV Index:   30 LVOT Area:     3.14 cm  RIGHT VENTRICLE RV Basal  diam:  3.95 cm RV Mid diam:    3.40 cm RV S prime:     14.70 cm/s TAPSE (M-mode): 2.1 cm LEFT ATRIUM             Index  RIGHT ATRIUM           Index LA diam:        4.40 cm 2.07 cm/m   RA Area:     12.90 cm LA Vol (A2C):   67.1 ml 31.50 ml/m  RA Volume:   33.90 ml  15.91 ml/m LA Vol (A4C):   57.0 ml 26.75 ml/m LA Biplane Vol: 62.9 ml 29.52 ml/m  AORTIC VALVE                    PULMONIC VALVE AV Area (Vmax):    2.48 cm     PV Vmax:       1.02 m/s AV Area (Vmean):   2.47 cm     PV Peak grad:  4.2 mmHg AV Area (VTI):     2.56 cm AV Vmax:           137.00 cm/s AV Vmean:          89.000 cm/s AV VTI:            0.247 m AV Peak Grad:      7.5 mmHg AV Mean Grad:      4.0 mmHg LVOT Vmax:         108.00 cm/s LVOT Vmean:        70.000 cm/s LVOT VTI:          0.201 m LVOT/AV VTI ratio: 0.81  AORTA Ao Root diam: 3.60 cm Ao Asc diam:  3.40 cm MITRAL VALVE MV Area (PHT): 5.88 cm     SHUNTS MV Area VTI:   2.64 cm     Systemic VTI:  0.20 m MV Peak grad:  4.7 mmHg     Systemic Diam: 2.00 cm MV Mean grad:  2.0 mmHg MV Vmax:       1.08 m/s MV Vmean:      71.1 cm/s MV Decel Time: 129 msec MV E velocity: 117.00 cm/s MV A velocity: 95.90 cm/s MV E/A ratio:  1.22 Mellody Drown Alluri Electronically signed by Windell Norfolk Signature Date/Time: 03/11/2024/4:49:39 PM    Final      ECHO as above  TELEMETRY reviewed by me 03/13/24: Sinus rhythm rate 80s  EKG reviewed by me 03/13/24: sinus tachycardia 1st degree AVB rate 110 bpm  DATA reviewed by me 03/13/24: last 24h vitals tele labs imaging I/O, hospitalist progress note  Principal Problem:   Severe sepsis with acute organ dysfunction (HCC) Active Problems:   CAD (coronary artery disease)   COPD (chronic obstructive pulmonary disease) (HCC)   Dyslipidemia   Hyperlipidemia, unspecified   Hypertension   Personal history of gout   Primary osteoarthritis of both knees   Elevated serum creatinine   Elevated troponin   Fever   Thyroid nodule greater than or equal  to 1.5 cm in diameter incidentally noted on imaging study   Acute hypoxic respiratory failure (HCC)    ASSESSMENT AND PLAN: Todd Rojas is a 83 y.o. male with a past medical history of CAD (2006 Anterior STEMI-LAD PCI, 2010 LAD PCI, 6/202 LCX, LAD and OM1 PCI), chronic HFrEF, HTN and dyslipidemia  who presented to the ED on 03/09/2024 for fever and SOB. Troponins checked and found to be mildly elevated, cardiology was consulted for further evaluation.   # Demand ischemia # Coronary artery disease # Severe sepsis # ?Ileus Patient with hx of CAD presented to the ED with SOB and fevers, found to be septic and started on IV antibiotics. Troponins checked  and found to be elevated, reports pleuritic CP.  Echo this admission with EF 40%, regional wall motion abnormalities similar to prior echoes. -Given complaints of abdominal pain today and no complaints of chest pain will defer any further cardiac diagnostic workup at this time. -Home BP meds held given borderline BP and elevated Cr. Resume as able.  -Continue rosuvastatin 20 mg daily and aspirin 81 mg daily.  -Further management per primary team.   This patient's case was discussed and created with Dr. Corky Sing and he is in agreement.  Signed:  Gale Journey, PA-C  03/13/2024, 9:26 AM [facility]

## 2024-03-13 NOTE — Assessment & Plan Note (Signed)
 Patient developed abdominal distention and crampy abdominal pain.  Passing flatus, last bowel movement was yesterday morning.  No nausea or vomiting.  Imaging with right colonic gaseous distention without any obvious obstruction, may be colonic ileus. -Diet switched to clear liquid -Encourage mobilization -Will place NG tube if develop nausea and vomiting -If continue to get worse then need surgical consult

## 2024-03-13 NOTE — Assessment & Plan Note (Signed)
 With history of anterior STEMI in 2006, transferred to [facility] status post cardiac catheterization with PCI to mid LAD using Cypher stent; currently no longer on Plavix Complete echo on 05/17/2022: Estimated ejection fraction is 55%, trivial mitral regurgitation, trivial tricuspid regurgitation, trivial pulmonary valve regurgitation; aortic valve has normal variant Home aspirin 81 mg daily, rosuvastatin 20 mg nightly were resumed on admission

## 2024-03-13 NOTE — TOC Initial Note (Signed)
 Transition of Care [facility]) - Initial/Assessment Note    Patient Details  Name: Todd Rojas MRN: [id number] Date of Birth: Oct 05, 1941  [facility] CM/SW Contact:    Margarito Liner, LCSW Phone Number: 03/13/2024, 11:48 AM  Clinical Narrative:  CSW met with patient. Wife at bedside. CSW introduced role and explained that therapy recommendations would be discussed. Patient and wife are willing to consider SNF but wife is ultimately hoping he will be strong enough to return home at discharge. They gave CSW permission to send out SNF referral and see what his options are. No further concerns. CSW will continue to follow patient and his wife for support and facilitate discharge once medically stable.                Expected Discharge Plan: Skilled Nursing Facility Barriers to Discharge: Continued Medical Work up   Patient Goals and CMS Choice            Expected Discharge Plan and Services     Post Acute Care Choice:  (TBD) Living arrangements for the past 2 months: Single Family Home                                      Prior Living Arrangements/Services Living arrangements for the past 2 months: Single Family Home Lives with:: Spouse Patient language and need for interpreter reviewed:: Yes Do you feel safe going back to the place where you live?: Yes      Need for Family Participation in Patient Care: Yes (Comment) Care giver support system in place?: Yes (comment)   Criminal Activity/Legal Involvement Pertinent to Current Situation/Hospitalization: No - Comment as needed  Activities of Daily Living   ADL Screening (condition at time of admission) Independently performs ADLs?: Yes (appropriate for developmental age) Is the patient deaf or have difficulty hearing?: No Does the patient have difficulty seeing, even when wearing glasses/contacts?: No Does the patient have difficulty concentrating, remembering, or making decisions?: No  Permission  Sought/Granted Permission sought to share information with : Facility Medical sales representative, Family Supports Permission granted to share information with : Yes, Verbal Permission Granted  Share Information with NAME: Omero Kowal  Permission granted to share info w AGENCY: SNF's  Permission granted to share info w Relationship: Wife  Permission granted to share info w Contact Information: [phone]  Emotional Assessment Appearance:: Appears stated age Attitude/Demeanor/Rapport: Engaged, Gracious Affect (typically observed): Accepting Orientation: : Oriented to Self, Oriented to Place, Oriented to  Time, Oriented to Situation Alcohol / Substance Use: Not Applicable Psych Involvement: No (comment)  Admission diagnosis:  Hyponatremia [E87.1] Acute respiratory failure with hypoxia (HCC) [J96.01] AKI (acute kidney injury) (HCC) [N17.9] Severe sepsis with acute organ dysfunction (HCC) [A41.9, R65.20] Sepsis, due to unspecified organism, unspecified whether acute organ dysfunction present [facility]) [A41.9] Patient Active Problem List   Diagnosis Date Noted   Thyroid nodule greater than or equal to 1.5 cm in diameter incidentally noted on imaging study 03/10/2024   Acute hypoxic respiratory failure (HCC) 03/10/2024   Severe sepsis with acute organ dysfunction (HCC) 03/09/2024   Elevated serum creatinine 03/09/2024   Elevated troponin 03/09/2024   Fever 03/09/2024   Sepsis (HCC) 08/16/2017   Acute renal failure with tubular necrosis (HCC)    Gross hematuria    COPD (chronic obstructive pulmonary disease) (HCC) 07/03/2017   Hyperlipidemia, unspecified 07/03/2017   Personal history of gout 12/09/2016  Status post left unicompartmental knee replacement 11/03/2016   Right-sided low back pain without sciatica 04/14/2016   Status post left partial knee replacement 12/23/2015   Right hip pain 07/30/2015   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 - [address] [place] [address] Phone: [phone] Fax: [phone]     Social Drivers of Health (SDOH) Social History: SDOH Screenings   Food Insecurity: No Food Insecurity (03/10/2024)  Housing: Low Risk  (03/10/2024)  Transportation Needs: No Transportation Needs (03/10/2024)  Utilities: Not At Risk (03/10/2024)  Depression (PHQ2-9): Low Risk  (07/12/2021)  Financial Resource Strain: Low Risk  (10/23/2023)   Received from [facility]  Social Connections: Socially Integrated (03/10/2024)  Tobacco Use: Medium Risk (03/09/2024)   SDOH Interventions:     Readmission Risk Interventions     No data to display

## 2024-03-13 NOTE — Progress Notes (Signed)
 Progress Note   Patient: Todd Rojas ZOX:[id number] DOB: 02/05/1941 DOA: 03/09/2024     4 DOS: the patient was seen and examined on 03/13/2024   Brief Rojas course: Mr. Todd Rojas is an 83 year old male with history of CKD stage IIIa, chronic gout, hypertension, hyperlipidemia, interstitial pulmonary disease, bronchiectasis, COPD, who presents emergency department for chief concerns of fever, chills, shortness of breath for 2 weeks.  Vitals in the ED showed temperature of 98.4, respiration rate 25, heart rate 94, blood pressure initially 96/69 and improved to 107/66, SpO2 initially desatted to 88% on room air and improved to 98% on 2 L nasal cannula.  Serum sodium is 131, potassium 3.7, chloride 99, bicarb 25, BUN of 32, serum creatinine 1.58, EGFR 43, nonfasting blood glucose 131, WBC 7.1, hemoglobin 12.4, platelets of 191. BNP was elevated at 145.6.  High sensitive troponin is 98.  Lactic acid 2.6 and on repeat was 1.3.  Procalcitonin was elevated at 0.31. Chest x-ray with no active cardiopulmonary disease. CT head without any acute abnormality.  CT abdomen with concern of right lower lobe airspace disease which could represent infection or atelectasis in the setting of right hemidiaphragm elevation.  Also noted subtle to hyperenhancing right prostatic nodule which will need outpatient urology evaluation.  Also noted hyperattenuation throughout the bladder.  ED treatment: Ondansetron 4 mg IV one-time dose, cefepime 2 g IV, metronidazole 500 mg IV one-time dose, vancomycin, sodium chloride 1 L bolus.  3/23: Vital stable on 2 L of oxygen, no baseline oxygen use, though CBC stable with some decrease in hemoglobin but all cell lines decreased, hyponatremia improved with sodium at 134, renal function with some improvement to creatinine at 1.47, UA negative for UTI.  Preliminary blood cultures and respiratory viral panels negative.  CT chest with bibasilar atelectasis right greater than left,  possible developing pneumonia in right lower lobe with trace right pleural fluid.  Incidental finding of 2.7 cm low-attenuation left thyroid nodule with recommendations of thyroid ultrasound which was ordered.  Also noted cholelithiasis and enlarged pulmonary trunk indicative of pulmonary arterial hypertension.  3/24: Hemodynamically stable, worsening bilateral knee pain so imaging was obtained, right with mild effusion but no other complications noted, left with chronic arthroplasty.  Echocardiogram done-pending results Thyroid ultrasound with solid 32.2 cm nodule required outpatient biopsy. Blood cultures remain negative in 2 days.  3/25: Vital and labs stable, echocardiogram with EF of 40%, did show regional wall motion abnormalities and grade 1 diastolic dysfunction.  Prior echo done in May 2023 with normal EF.  Case was discussed with cardiology and they think all of those changes were chronic. He was complaining of crampy abdominal pain today.  Couple of loose bowel movements, not watery and no leukocytosis. Repeat CT abdomen was ordered. Family is very frustrated he is not feeling better.  Apparently he was not doing well with a progressive decline over the past few months and when he became febrile with this current pneumonia that really hit him bad.  3/26: Worsening abdominal distention, repeat KUB with dilated right side of colon but no obvious sign of obstruction, passing flatus, last BM was yesterday morning.  No nausea or vomiting, diet switched to clear liquid and encouraging mobilization. Switched antibiotics to Levaquin for 2 more days to complete a total of 7-day course.   Assessment and Plan: * Severe sepsis with acute organ dysfunction (HCC) Elevated lactic acid of 2.6, increased respiration rate, with fever of 102 F at home, organ involvement are cardiac  and pulmonary with hypoxic respiratory failure at 88% on room air, elevated high sensitive troponin of 98 and elevated  BNP Imaging concerning for right lower lobe pneumonia, although not much upper respiratory symptoms.  Procalcitonin at 0.352. Respiratory panels were negative. Preliminary blood cultures negative UA does not consistent with UTI Patient received broad-spectrum antibiotics -Completed 5 days of cefepime and Zithromax-switching to Levaquin for 2 more days -Patient will need a repeat CT chest in 3 to 4 weeks to see the resolution of current abnormalities  Acute hypoxic respiratory failure (HCC) Likely with pneumonia, currently requiring 2 L of oxygen with no baseline use. -Continue supplemental oxygen-wean as tolerated  Fever Afebrile now. Patient had 1 set of blood cultures done on 3/19 03/2024: Which was read as no growth, 5 days Preliminary repeat blood cultures negative in 4 days Concern of right lower lobe pneumonia. -Supportive care and antibiotics as listed above  Gaseous abdominal distention Patient developed abdominal distention and crampy abdominal pain.  Passing flatus, last bowel movement was yesterday morning.  No nausea or vomiting.  Imaging with right colonic gaseous distention without any obvious obstruction, may be colonic ileus. -Diet switched to clear liquid -Encourage mobilization -Will place NG tube if develop nausea and vomiting -If continue to get worse then need surgical consult  Elevated troponin First high sensitivity troponin was elevated at 98 >>101>.175>.226,  Suspect secondary to demand ischemia in setting of severe sepsis with acute hypoxic respiratory failure Echocardiogram with EF of 40% and regional wall motion abnormalities, discussed with cardiology and they think these abnormalities are chronic. Patient was also experiencing intermittent chest pain over the past couple of months which seems more exertional -Continue with heparin infusion-message sent whether to continue or stop -Cardiology consult as requested by family   CAD (coronary artery  disease) With history of anterior STEMI in 2006, transferred to Todd Rojas status post cardiac catheterization with PCI to mid LAD using Cypher stent; currently no longer on Plavix Complete echo on 05/17/2022: Estimated ejection fraction is 55%, trivial mitral regurgitation, trivial tricuspid regurgitation, trivial pulmonary valve regurgitation; aortic valve has normal variant Home aspirin 81 mg daily, rosuvastatin 20 mg nightly were resumed on admission  COPD (chronic obstructive pulmonary disease) (HCC) DuoNebs 4 times daily, 3 doses ordered on admission Home long-acting inhaler equivalent resumed on admission  Elevated serum creatinine Does not meet acute kidney injury, suspect prerenal in setting of nausea vomiting, creatinine with some improvement to 1.36 today Serum creatinine baseline is 1.35-1.36/eGFR 52-55, consistent with CKD stage III A -Monitor renal function -Avoid nephrotoxins  Hypertension Blood pressure borderline soft Home furosemide and lisinopril 20 mg daily were not resumed on admission in setting of increase in sCr level from baseline -Continue holding home antihypertensives Hydralazine 5 mg IV every 6 hours as needed for SBP greater 165, 5 days ordered  Hyperlipidemia, unspecified Home rosuvastatin 20 mg nightly resumed  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 Imaging was obtained today which shows very mild right knee effusion and no effusion on left. -Supportive care  Thyroid nodule greater than or equal to 1.5 cm in diameter incidentally noted on imaging study Incidental finding of a 2.7 cm thyroid nodule. -Thyroid ultrasound with solitary nodule which required outpatient biopsy -Check thyroid panel with TSH-pending results  Personal history of gout Home allopurinol 300 mg daily resumed on admission   Subjective: Patient with worsening abdominal distention, passing flatus and last bowel movement was yesterday morning.  No nausea or  vomiting.  Intermittent  crampy abdominal pain.  Physical Exam: Vitals:   03/13/24 0428 03/13/24 0808 03/13/24 1128 03/13/24 1551  BP: (!) 125/58 (!) 126/55 119/65 113/64  Pulse: 93 92 95 89  Resp: 20 20 20 18   Temp: 98.9 F (37.2 C) 98.9 F (37.2 C) 98.1 F (36.7 C) 97.7 F (36.5 C)  TempSrc: Oral  Oral Oral  SpO2: 95% 94% 95% 98%  Weight:      Height:       General.  Frail elderly man, in no acute distress. Pulmonary.  Lungs clear bilaterally, normal respiratory effort. CV.  Regular rate and rhythm, no JVD, rub or murmur. Abdomen.  nontender, distended, BS positive. CNS.  Alert and oriented .  No focal neurologic deficit. Extremities.  No edema, no cyanosis, pulses intact and symmetrical.  Data Reviewed: Prior data reviewed  Family Communication: Discussed with wife at bedside  Disposition: Status is: Inpatient Remains inpatient appropriate because: Severity of illness  Planned Discharge Destination: SNF  DVT prophylaxis.  Heparin infusion Time spent: 45 minutes  This record has been created using Conservation officer, historic buildings. Errors have been sought and corrected,but may not always be located. Such creation errors do not reflect on the standard of care.   Author: Arnetha Courser, MD 03/13/2024 4:17 PM  For on call review [email or web address].

## 2024-03-13 NOTE — [place] FL2 (Signed)
  MEDICAID FL2 LEVEL OF CARE FORM     IDENTIFICATION  Patient Name: Todd Rojas Birthdate: Oct 06, 1941 Sex: male Admission Date (Current Location): 03/09/2024  [facility] and [place] Number:  Chiropodist and Address:  [facility], [address], [place], [place] [address]      Provider Number: [id number]  Attending Physician Name and Address:  Arnetha Courser, MD  Relative Name and Phone Number:       Current Level of Care: Hospital Recommended Level of Care: Skilled Nursing Facility Prior Approval Number:    Date Approved/Denied:   PASRR Number: [id number]  Discharge Plan: SNF    Current Diagnoses: Patient Active Problem List   Diagnosis Date Noted   Thyroid nodule greater than or equal to 1.5 cm in diameter incidentally noted on imaging study 03/10/2024   Acute hypoxic respiratory failure (HCC) 03/10/2024   Severe sepsis with acute organ dysfunction (HCC) 03/09/2024   Elevated serum creatinine 03/09/2024   Elevated troponin 03/09/2024   Fever 03/09/2024   Sepsis (HCC) 08/16/2017   Acute renal failure with tubular necrosis (HCC)    Gross hematuria    COPD (chronic obstructive pulmonary disease) (HCC) 07/03/2017   Hyperlipidemia, unspecified 07/03/2017   Personal history of gout 12/09/2016   Status post left unicompartmental knee replacement 11/03/2016   Right-sided low back pain without sciatica 04/14/2016   Status post left partial knee replacement 12/23/2015   Right hip pain 07/30/2015   Status post right partial knee replacement 07/30/2015   Primary osteoarthritis of both knees 05/05/2015   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   GI disease 03/09/2013   Hearing loss 03/09/2013   CAD (coronary artery disease) 02/13/2012   Dyslipidemia 02/13/2012   Hypertension 02/13/2012    Orientation RESPIRATION BLADDER Height & Weight     Self, Time, Situation, Place  O2 (Nasal Cannula 2 L) Continent Weight: 215 lb (97.5  kg) Height:  5\' 9"  (175.3 cm)  BEHAVIORAL SYMPTOMS/MOOD NEUROLOGICAL BOWEL NUTRITION STATUS     (None) Continent Diet (Heart healthy)  AMBULATORY STATUS COMMUNICATION OF NEEDS Skin   Limited Assist Verbally Normal                       Personal Care Assistance Level of Assistance  Bathing, Feeding, Dressing Bathing Assistance: Maximum assistance Feeding assistance: Limited assistance Dressing Assistance: Maximum assistance     Functional Limitations Info  Sight, Hearing, Speech Sight Info: Adequate Hearing Info: Adequate Speech Info: Adequate    SPECIAL CARE FACTORS FREQUENCY  PT (By licensed PT), OT (By licensed OT)     PT Frequency: 5 x week OT Frequency: 5 x week            Contractures Contractures Info: Not present    Additional Factors Info  Code Status, Allergies Code Status Info: DNR Allergies Info: NKDA           Current Medications (03/13/2024):  This is the current hospital active medication list Current Facility-Administered Medications  Medication Dose Route Frequency Provider Last Rate Last Admin   acetaminophen (TYLENOL) tablet 650 mg  650 mg Oral Q6H PRN Cox, Amy N, DO   650 mg at 03/10/24 2005   Or   acetaminophen (TYLENOL) suppository 650 mg  650 mg Rectal Q6H PRN Cox, Amy N, DO       allopurinol (ZYLOPRIM) tablet 300 mg  300 mg Oral Daily Cox, Amy N, DO   300 mg at 03/13/24 1035   alum &  mag hydroxide-simeth (MAALOX/MYLANTA) 200-200-20 MG/5ML suspension 15 mL  15 mL Oral Q4H PRN Cox, Amy N, DO   15 mL at 03/12/24 1905   aspirin EC tablet 81 mg  81 mg Oral Daily Cox, Amy N, DO   81 mg at 03/13/24 1035   ceFEPIme (MAXIPIME) 2 g in sodium chloride 0.9 % 100 mL IVPB  2 g Intravenous Q12H Cox, Amy N, DO 200 mL/hr at 03/13/24 0005 2 g at 03/13/24 0005   cyanocobalamin (VITAMIN B12) tablet 1,000 mcg  1,000 mcg Oral Daily Cox, Amy N, DO   1,000 mcg at 03/13/24 1035   dicyclomine (BENTYL) capsule 10 mg  10 mg Oral TID PRN Mansy, Jan A, MD   10 mg at  03/12/24 1905   hydrALAZINE (APRESOLINE) injection 5 mg  5 mg Intravenous Q6H PRN Cox, Amy N, DO       loperamide (IMODIUM) capsule 2 mg  2 mg Oral PRN Mansy, Jan A, MD   2 mg at 03/12/24 1191   mometasone-formoterol (DULERA) 200-5 MCG/ACT inhaler 2 puff  2 puff Inhalation BID Cox, Amy N, DO   2 puff at 03/13/24 0749   morphine (PF) 2 MG/ML injection 2 mg  2 mg Intravenous Q4H PRN Mansy, Jan A, MD   2 mg at 03/12/24 1908   nitroGLYCERIN (NITROSTAT) SL tablet 0.4 mg  0.4 mg Sublingual Q5 min PRN Cox, Amy N, DO       ondansetron (ZOFRAN) tablet 4 mg  4 mg Oral Q6H PRN Cox, Amy N, DO       Or   ondansetron (ZOFRAN) injection 4 mg  4 mg Intravenous Q6H PRN Cox, Amy N, DO   4 mg at 03/11/24 0413   rosuvastatin (CRESTOR) tablet 20 mg  20 mg Oral QHS Cox, Amy N, DO   20 mg at 03/12/24 2042   traMADol (ULTRAM) tablet 50 mg  50 mg Oral Q6H PRN Arnetha Courser, MD   50 mg at 03/13/24 4782     Discharge Medications: Please see discharge summary for a list of discharge medications.  Relevant Imaging Results:  Relevant Lab Results:   Additional Information SS#: [id number]  Margarito Liner, LCSW

## 2024-03-13 NOTE — Evaluation (Signed)
 Occupational Therapy Evaluation Patient Details Name: Todd Rojas MRN: [id number] DOB: 12-04-1941 Today's Date: 03/13/2024   History of Present Illness   Pt is an 83 year old male   MD work up including sepsis, acute hypoxic respiratory failure, PNA    Pmh significant for CKD stage IIIa, chronic gout, hypertension, hyperlipidemia, interstitial pulmonary disease, bronchiectasis, COPD     Clinical Impressions Chart reviewed, pt greeted edge of bed with transport and NT, attempting to get back to bed for transport to x ray. Pt is grimacing and crying in pain, requiring increased cueing/time for one step direction following. PTA pt is indep in ADL/IADL, amb with no AD. Pt presents with deficits in strength, endurance, activity tolerance, balance, pain affecting safe and optimal ADL completion. STS completed with MAX A with RW with pt unable to achieve full standing, MAX A for lateral scoot up the bed. MAX A required for sit>supine. Multi modal cues throughout for one step direction following. Anticipate improved performance with pain control, will continue to assess. Pt is left in care of transport to go to xray. OT will follow acutely.      If plan is discharge home, recommend the following:   A lot of help with walking and/or transfers;A lot of help with bathing/dressing/bathroom     Functional Status Assessment   Patient has had a recent decline in their functional status and demonstrates the ability to make significant improvements in function in a reasonable and predictable amount of time.     Equipment Recommendations   BSC/3in1;Tub/shower seat     Recommendations for Other Services         Precautions/Restrictions   Precautions Precautions: Fall Recall of Precautions/Restrictions: Intact     Mobility Bed Mobility Overal bed mobility: Needs Assistance Bed Mobility: Sit to Supine       Sit to supine: Max assist   General bed mobility comments: for  management of BLE    Transfers Overall transfer level: Needs assistance Equipment used: Rolling walker (2 wheels) Transfers: Sit to/from Stand Sit to Stand: Max assist, From elevated surface           General transfer comment: limited due to B knee pain, attempted lateral scoots up the bed requiring MAX A, frequent multi modal cues      Balance Overall balance assessment: Needs assistance Sitting-balance support: Feet supported, Bilateral upper extremity supported Sitting balance-Leahy Scale: Fair     Standing balance support: Bilateral upper extremity supported, Reliant on assistive device for balance Standing balance-Leahy Scale: Zero                             ADL either performed or assessed with clinical judgement   ADL Overall ADL's : Needs assistance/impaired                     Lower Body Dressing: Maximal assistance Lower Body Dressing Details (indicate cue type and reason): socks               General ADL Comments: limited due to pain     Vision Patient Visual Report: No change from baseline Additional Comments: will continue to assess     Perception         Praxis         Pertinent Vitals/Pain Pain Assessment Pain Assessment: 0-10 Pain Score: 10-Worst pain ever Pain Location: stomach, B knees Pain Descriptors / Indicators: Aching, Grimacing, Guarding, Sore  Pain Intervention(s): Limited activity within patient's tolerance, Monitored during session, Repositioned (nurse notified)     Extremity/Trunk Assessment Upper Extremity Assessment Upper Extremity Assessment: Generalized weakness   Lower Extremity Assessment Lower Extremity Assessment: Generalized weakness (B knees appear swollen , nurse notified)       Communication Communication Communication: Impaired Factors Affecting Communication: Hearing impaired   Cognition Arousal: Alert Behavior During Therapy: WFL for tasks assessed/performed, Anxious Cognition:  Difficult to assess Difficult to assess due to: Hard of hearing/deaf           OT - Cognition Comments: limited as pt is in signicant pain, affecting one step direction following (but does follow with increased time/cueing) and then taken down to X Ray by transport, will continue to assess; at baseline pt cognition is [facility];                 Following commands: Impaired Following commands impaired: Follows one step commands with increased time     Cueing  General Comments   Cueing Techniques: Verbal cues  generally limited by pain on this date   Exercises Other Exercises Other Exercises: edu pt/wife re: role of OT, role of rehab, discharge recommendations, importance of continued oob mobility attempts   Shoulder Instructions      Home Living Family/patient expects to be discharged to:: Private residence Living Arrangements: Spouse/significant other Available Help at Discharge: Family Type of Home: House Home Access: Stairs to enter Secretary/administrator of Steps: 3 Entrance Stairs-Rails: None Home Layout: One level     Bathroom Shower/Tub: Tub/shower unit;Tub only (likes to take tub)   Bathroom Toilet:  (has bath) Bathroom Accessibility: Yes How Accessible: Accessible via walker Home Equipment: Rolling Walker (2 wheels);Cane - single point          Prior Functioning/Environment Prior Level of Function : Independent/Modified Independent             Mobility Comments: amb with no AD ADLs Comments: MOD I-I in ADL/IADL recently processed 7 deer, gardens    OT Problem List: Decreased strength;Decreased activity tolerance;Impaired balance (sitting and/or standing);Decreased knowledge of use of DME or AE   OT Treatment/Interventions: Self-care/ADL training;Balance training;Therapeutic exercise;Therapeutic activities;DME and/or AE instruction;Patient/family education      OT Goals(Current goals can be found in the care plan section)   Acute Rehab OT  Goals Patient Stated Goal: mobilize OT Goal Formulation: With patient Time For Goal Achievement: 03/27/24 Potential to Achieve Goals: Good ADL Goals Pt Will Perform Grooming: with set-up;sitting Pt Will Perform Lower Body Dressing: with min assist;sitting/lateral leans;sit to/from stand Pt Will Transfer to Toilet: with min assist;ambulating Pt Will Perform Toileting - Clothing Manipulation and hygiene: with min assist;sitting/lateral leans;sit to/from stand   OT Frequency:  Min 2X/week    Co-evaluation              AM-PAC OT "6 Clicks" Daily Activity     Outcome Measure Help from another person eating meals?: None Help from another person taking care of personal grooming?: A Little Help from another person toileting, which includes using toliet, bedpan, or urinal?: A Lot Help from another person bathing (including washing, rinsing, drying)?: A Lot Help from another person to put on and taking off regular upper body clothing?: A Lot Help from another person to put on and taking off regular lower body clothing?: A Lot 6 Click Score: 15   End of Session Nurse Communication: Mobility status;Other (comment) (pain)  Activity Tolerance: Patient limited by pain Patient left: in bed;with  call bell/phone within reach;with family/visitor present (transport present to take to xray)  OT Visit Diagnosis: Other abnormalities of gait and mobility (R26.89)                Time: 4401-0272 OT Time Calculation (min): 11 min Charges:  OT General Charges $OT Visit: 1 Visit OT Evaluation $OT Eval Low Complexity: 1 Low  Oleta Mouse, OTD OTR/L  03/13/24, 10:17 AM

## 2024-03-13 NOTE — Plan of Care (Signed)

## 2024-03-14 DIAGNOSIS — R6520 Severe sepsis without septic shock: Secondary | ICD-10-CM | POA: Diagnosis not present

## 2024-03-14 DIAGNOSIS — A419 Sepsis, unspecified organism: Secondary | ICD-10-CM | POA: Diagnosis not present

## 2024-03-14 LAB — BASIC METABOLIC PANEL WITH GFR
Anion gap: 10 (ref 5–15)
BUN: 25 mg/dL — ABNORMAL HIGH (ref 8–23)
CO2: 24 mmol/L (ref 22–32)
Calcium: 8.1 mg/dL — ABNORMAL LOW (ref 8.9–10.3)
Chloride: 98 mmol/L (ref 98–111)
Creatinine, Ser: 1.29 mg/dL — ABNORMAL HIGH (ref 0.61–1.24)
GFR, Estimated: 55 mL/min — ABNORMAL LOW (ref >60)
Glucose, Bld: 110 mg/dL — ABNORMAL HIGH (ref 70–99)
Potassium: 3.9 mmol/L (ref 3.5–5.1)
Sodium: 132 mmol/L — ABNORMAL LOW (ref 135–145)

## 2024-03-14 LAB — CBC
HCT: 32.8 % — ABNORMAL LOW (ref 39.0–52.0)
Hemoglobin: 10.8 g/dL — ABNORMAL LOW (ref 13.0–17.0)
MCH: 32.5 pg (ref 26.0–34.0)
MCHC: 32.9 g/dL (ref 30.0–36.0)
MCV: 98.8 fL (ref 80.0–100.0)
Platelets: 251 10*3/uL (ref 150–400)
RBC: 3.32 MIL/uL — ABNORMAL LOW (ref 4.22–5.81)
RDW: 13.6 % (ref 11.5–15.5)
WBC: 9.6 10*3/uL (ref 4.0–10.5)
nRBC: 0 % (ref 0.0–0.2)

## 2024-03-14 LAB — CULTURE, BLOOD (ROUTINE X 2)
Culture: NO GROWTH
Culture: NO GROWTH

## 2024-03-14 MED ORDER — DICLOFENAC SODIUM 1 % EX GEL
2.0000 g | Freq: Three times a day (TID) | CUTANEOUS | Status: DC
  Administered 2024-03-15 – 2024-04-09 (×63): 2 g via TOPICAL
  Filled 2024-03-14 (×3): qty 100

## 2024-03-14 MED ORDER — FUROSEMIDE 10 MG/ML IJ SOLN
40.0000 mg | Freq: Once | INTRAMUSCULAR | Status: AC
  Administered 2024-03-14: 40 mg via INTRAVENOUS
  Filled 2024-03-14: qty 4

## 2024-03-14 MED ORDER — SENNOSIDES-DOCUSATE SODIUM 8.6-50 MG PO TABS
1.0000 | ORAL_TABLET | Freq: Two times a day (BID) | ORAL | Status: DC
  Administered 2024-03-14 – 2024-03-15 (×3): 1 via ORAL
  Filled 2024-03-14 (×3): qty 1

## 2024-03-14 NOTE — Care Management Important Message (Signed)
 Important Message  Patient Details  Name: Todd Rojas MRN: [id number] Date of Birth: 1941-10-24   Important Message Given:  Yes - Medicare IM     Bernadette Hoit 03/14/2024, 2:31 PM

## 2024-03-14 NOTE — Plan of Care (Signed)

## 2024-03-14 NOTE — TOC Progression Note (Signed)
 Transition of Care [facility]) - Progression Note    Patient Details  Name: Todd Rojas MRN: [id number] Date of Birth: 01/28/1941  Transition of Care [facility]) CM/SW Contact  Margarito Liner, LCSW Phone Number: 03/14/2024, 3:17 PM  Clinical Narrative:   CSW provided bed offers to wife and daughter at bedside. Daughter asked about home health and personal care services. CSW made referral to [employer] to assist with PCS.  Expected Discharge Plan: Skilled Nursing Facility Barriers to Discharge: Continued Medical Work up  Expected Discharge Plan and Services     Post Acute Care Choice:  (TBD) Living arrangements for the past 2 months: Single Family Home                                       Social Determinants of Health (SDOH) Interventions SDOH Screenings   Food Insecurity: No Food Insecurity (03/10/2024)  Housing: Low Risk  (03/10/2024)  Transportation Needs: No Transportation Needs (03/10/2024)  Utilities: Not At Risk (03/10/2024)  Depression (PHQ2-9): Low Risk  (07/12/2021)  Financial Resource Strain: Low Risk  (10/23/2023)   Received from [facility]  Social Connections: Socially Integrated (03/10/2024)  Tobacco Use: Medium Risk (03/09/2024)    Readmission Risk Interventions     No data to display

## 2024-03-14 NOTE — Progress Notes (Signed)
 Occupational Therapy Treatment Patient Details Name: Todd Rojas MRN: [id number] DOB: [birth year] Today's Date: 03/14/2024   History of present illness Pt is an 83 year old male   MD work up including sepsis, acute hypoxic respiratory failure, PNA    Pmh significant for CKD stage IIIa, chronic gout, hypertension, hyperlipidemia, interstitial pulmonary disease, bronchiectasis, COPD   OT comments  Chart reviewed to date, co tx with PT due to pt tolerance and to optimize mobility. Pt is grimacing in pain, reported in stomach/B knees but agreeable to tx session targeting improving functional mobility in preparation for ADL tasks. MAX A required for bed mobility, STS with MOD A +2, amb 30' then 20' with CGA +2 for close chair follow. Pt required frequent cueing for technique throughout. Pt required  rest breaks due to fatigue, pain, and complaints of hand, testicle, and knee swelling. Team notified. SET Up for feeding/grooming tasks. Pt is making progress towards goals, discharge recommendation remains appropriate. OT will continue to follow       If plan is discharge home, recommend the following:  A lot of help with walking and/or transfers;A lot of help with bathing/dressing/bathroom   Equipment Recommendations  BSC/3in1;Tub/shower seat    Recommendations for Other Services      Precautions / Restrictions Precautions Precautions: Fall Recall of Precautions/Restrictions: Intact Restrictions Weight Bearing Restrictions Per Provider Order: No       Mobility Bed Mobility Overal bed mobility: Needs Assistance Bed Mobility: Sit to Supine     Supine to sit: Max assist, +2 for safety/equipment, HOB elevated          Transfers Overall transfer level: Needs assistance Equipment used: Rolling walker (2 wheels) Transfers: Sit to/from Stand Sit to Stand: Mod assist, +2 physical assistance                 Balance Overall balance assessment: Needs  assistance Sitting-balance support: Feet supported, Bilateral upper extremity supported Sitting balance-Leahy Scale: Fair     Standing balance support: Bilateral upper extremity supported, Reliant on assistive device for balance Standing balance-Leahy Scale: Poor                             ADL either performed or assessed with clinical judgement   ADL Overall ADL's : Needs assistance/impaired     Grooming: Set up;Sitting               Lower Body Dressing: Maximal assistance   Toilet Transfer: +2 for safety/equipment;Ambulation;Rolling walker (2 wheels);Contact guard assist Toilet Transfer Details (indicate cue type and reason): simulated         Functional mobility during ADLs: Rolling walker (2 wheels);+2 for safety/equipment;Contact guard assist (80ft +20 ft after rest break; frequent cueing for technique) General ADL Comments: pain limited    Extremity/Trunk Assessment              Vision       Perception     Praxis     Communication Communication Communication: Impaired Factors Affecting Communication: Hearing impaired   Cognition Arousal: Alert Behavior During Therapy: WFL for tasks assessed/performed Cognition: Cognition impaired         Attention impairment (select first level of impairment): Sustained attention Executive functioning impairment (select all impairments): Problem solving                   Following commands: Intact Following commands impaired: Follows one step commands with increased time  Cueing   Cueing Techniques: Verbal cues, Tactile cues  Exercises Other Exercises Other Exercises: edu pt/family re: role of OT, role of rehab, importance of progressing mobility    Shoulder Instructions       General Comments noted edema throughout BLE including scrotum, nurse/doctor notified    Pertinent Vitals/ Pain       Pain Assessment Pain Assessment: Faces Faces Pain Scale: Hurts whole lot Pain  Location: stomach, B knees Pain Descriptors / Indicators: Aching, Grimacing, Guarding, Sore, Moaning Pain Intervention(s): Monitored during session, Limited activity within patient's tolerance, Repositioned (notified nurse)  Home Living                                          Prior Functioning/Environment              Frequency  Min 2X/week        Progress Toward Goals  OT Goals(current goals can now be found in the care plan section)  Progress towards OT goals: Progressing toward goals  Acute Rehab OT Goals Time For Goal Achievement: 03/27/24  Plan      Co-evaluation    PT/OT/SLP Co-Evaluation/Treatment: Yes Reason for Co-Treatment: Complexity of the patient's impairments (multi-system involvement) PT goals addressed during session: Mobility/safety with mobility OT goals addressed during session: ADL's and self-care      AM-PAC OT "6 Clicks" Daily Activity     Outcome Measure   Help from another person eating meals?: None Help from another person taking care of personal grooming?: A Little Help from another person toileting, which includes using toliet, bedpan, or urinal?: A Lot Help from another person bathing (including washing, rinsing, drying)?: A Lot Help from another person to put on and taking off regular upper body clothing?: A Lot Help from another person to put on and taking off regular lower body clothing?: A Lot 6 Click Score: 15    End of Session Equipment Utilized During Treatment: Rolling walker (2 wheels)  OT Visit Diagnosis: Other abnormalities of gait and mobility (R26.89)   Activity Tolerance Patient limited by pain   Patient Left in chair;with call bell/phone within reach;with chair alarm set   Nurse Communication Mobility status;Other (comment) (pain/swelling)        Time: 9604-5409 OT Time Calculation (min): 31 min  Charges: OT General Charges $OT Visit: 1 Visit OT Treatments $Therapeutic Activity: 8-22  mins  Oleta Mouse, OTD OTR/L  03/14/24, 2:04 PM

## 2024-03-14 NOTE — Progress Notes (Signed)
 PROGRESS NOTE    Todd Rojas  WUJ:[id number] DOB: [birth year] DOA: 03/09/2024 PCP: Barbette Reichmann, MD  [id number]  LOS: 5 days   Brief hospital course:   Assessment & Plan: Todd Rojas is an 83 year old male with history of CKD stage IIIa, chronic gout, hypertension, hyperlipidemia, interstitial pulmonary disease, bronchiectasis, COPD, who presents emergency department for chief concerns of fever, chills, shortness of breath for 2 weeks.   Vitals in the ED showed temperature of 98.4, respiration rate 25, heart rate 94, blood pressure initially 96/69 and improved to 107/66, SpO2 initially desatted to 88% on room air   * Severe sepsis with acute organ dysfunction (HCC) 2/2 PNA Elevated lactic acid of 2.6, increased respiration rate, with fever of 102 F at home, organ involvement are cardiac and pulmonary with hypoxic respiratory failure at 88% on room air, elevated high sensitive troponin of 98 and elevated BNP --Imaging concerning for right lower lobe pneumonia, although not much upper respiratory symptoms.  Procalcitonin at 0.352. Respiratory panels were negative. UA does not consistent with UTI Patient received broad-spectrum antibiotics -Completed 5 days of cefepime and Zithromax-switching to Levaquin for 2 more days -Patient will need a repeat CT chest in 3 to 4 weeks to see the resolution of current abnormalities   Acute hypoxic respiratory failure (HCC) Likely with pneumonia currently requiring 1 L of oxygen with no baseline use. --Continue supplemental O2 to keep sats >=92%, wean as tolerated    Fever Afebrile now. Patient had 1 set of blood cultures done on 3/19 03/2024: Which was read as no growth, 5 days Preliminary repeat blood cultures negative in 4 days Concern of right lower lobe pneumonia. -Supportive care and antibiotics as listed above   Gaseous abdominal distention 2/2 Colonic ileus Patient developed abdominal distention and crampy abdominal pain  after presentation. Imaging with right colonic gaseous distention without any obvious obstruction, may be colonic ileus. --received Imodium and opioids --d/c Imodium --avoid opioids --start Senna --GenSurg consult   Elevated troponin 2/2 demand ischemia First high sensitivity troponin was elevated at 98 >>101>175>226,  Suspect secondary to demand ischemia in setting of severe sepsis with acute hypoxic respiratory failure Echocardiogram with EF of 40% and regional wall motion abnormalities, discussed with cardiology and they think these abnormalities are chronic. Patient was also experiencing intermittent chest pain over the past couple of months which seems more exertional --cardio consulted, no plan for cardiac workup   CAD (coronary artery disease) With history of anterior STEMI in 2006, transferred to [facility] status post cardiac catheterization with PCI to mid LAD using Cypher stent; currently no longer on Plavix Complete echo on 05/17/2022: Estimated ejection fraction is 55%, trivial mitral regurgitation, trivial tricuspid regurgitation, trivial pulmonary valve regurgitation; aortic valve has normal variant --cont home ASA and Crestor   COPD (chronic obstructive pulmonary disease) (HCC) --cont home bronchodilator   Elevated serum creatinine Does not meet acute kidney injury, suspect prerenal in setting of nausea vomiting, creatinine with some improvement to 1.36 today Serum creatinine baseline is 1.35-1.36/eGFR 52-55, consistent with CKD stage III A -Monitor renal function -Avoid nephrotoxins   Hypertension, not currently active Blood pressure borderline soft Home furosemide and lisinopril 20 mg daily were not resumed on admission in setting of increase in sCr level from baseline -Continue holding home antihypertensives   Hyperlipidemia, unspecified --cont home statin   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 Imaging was obtained which shows very  mild right knee effusion and no effusion on left. --Voltaren gel  and heat pad   Thyroid nodule greater than or equal to 1.5 cm in diameter incidentally noted on imaging study Incidental finding of a 2.7 cm thyroid nodule. -Thyroid ultrasound with solitary nodule which required outpatient biopsy -Check thyroid panel with TSH-pending results   Personal history of gout --cont home allopurinol   DVT prophylaxis: Lovenox SQ Code Status: DNR  Family Communication: wife and daughter updated at bedside today Level of care: Progressive Dispo:   The patient is from: home Anticipated d/c is to: SNF rehab Anticipated d/c date is: 1-2 days   Subjective and Interval History:  Per family, pt had a tough night due to abdominal pain and N/V overnight.  Pt was sleepy during the day.   Objective: Vitals:   03/14/24 0001 03/14/24 0325 03/14/24 0738 03/14/24 1055  BP: (!) 129/59 (!) 120/58 131/67 124/65  Pulse: 92 98 94 88  Resp: 18 18 18 19   Temp: 99.5 F (37.5 C) 100.3 F (37.9 C) 98.5 F (36.9 C) 97.8 F (36.6 C)  TempSrc: Oral Oral Oral Oral  SpO2: 92% 93% 93% 93%  Weight:      Height:        Intake/Output Summary (Last 24 hours) at 03/14/2024 1947 Last data filed at 03/14/2024 1900 Gross per 24 hour  Intake 120 ml  Output --  Net 120 ml   Filed Weights   03/09/24 1127  Weight: 97.5 kg    Examination:   Constitutional: NAD, sleepy HEENT: conjunctivae and lids normal, EOMI CV: No cyanosis.   RESP: normal respiratory effort, on 1L Extremities: edema in BLE SKIN: warm, dry   Data Reviewed: I have personally reviewed labs and imaging studies  Time spent: 50 minutes  Darlin Priestly, MD [facility] If 7PM-7AM, please contact night-coverage 03/14/2024, 7:47 PM

## 2024-03-14 NOTE — Progress Notes (Signed)
 [facility] CLINIC CARDIOLOGY PROGRESS NOTE   Patient ID: Todd Rojas MRN: [id number] DOB/AGE: [birth year] 83 y.o.  Admit date: 03/09/2024 Referring Physician Dr. Nelson Chimes Primary Physician Barbette Reichmann, MD  Primary Cardiologist Ander Slade, [place] ([facility]) Reason for Consultation elevated troponin  HPI: Todd Rojas is a 83 y.o. male with a past medical history of CAD (2006 Anterior STEMI-LAD PCI, 2010 LAD PCI, 6/202 LCX, LAD and OM1 PCI), chronic HFrEF, HTN and dyslipidemia  who presented to the ED on 03/09/2024 for fever and SOB. Troponins checked and found to be mildly elevated, cardiology was consulted for further evaluation.   Interval History:  -Patient seen and examined this AM, resting comfortably in hospital bed. -Reports abdominal pain has improved.  Abdomen is still visibly distended.  Denies any chest pain. -BP and heart rate remain controlled.  Review of systems complete and found to be negative unless listed above    Vitals:   03/13/24 1947 03/14/24 0001 03/14/24 0325 03/14/24 0738  BP: 125/65 (!) 129/59 (!) 120/58 131/67  Pulse: 91 92 98 94  Resp: 20 18 18 18   Temp: 97.7 F (36.5 C) 99.5 F (37.5 C) 100.3 F (37.9 C) 98.5 F (36.9 C)  TempSrc: Oral Oral Oral Oral  SpO2: 97% 92% 93% 93%  Weight:      Height:        No intake or output data in the 24 hours ending 03/14/24 0918    PHYSICAL EXAM General: Well-appearing elderly male, well nourished, in no acute distress. HEENT: Normocephalic and atraumatic. Neck: No JVD.  Lungs: Normal respiratory effort on 1L [place]. Clear bilaterally to auscultation. No wheezes, crackles, rhonchi.  Heart: HRRR. Normal S1 and S2 without gallops or murmurs. Radial & DP pulses 2+ bilaterally. Abdomen: Distended appearing.  Msk: Normal strength and tone for age. Extremities: No clubbing, cyanosis or edema.   Neuro: Alert and oriented X 3. Psych: Mood appropriate, affect congruent.    LABS: Basic Metabolic  Panel: Recent Labs    03/13/24 0412 03/14/24 0340  NA 131* 132*  K 3.9 3.9  CL 102 98  CO2 24 24  GLUCOSE 102* 110*  BUN 23 25*  CREATININE 1.27* 1.29*  CALCIUM 7.9* 8.1*   Liver Function Tests: No results for input(s): "AST", "ALT", "ALKPHOS", "BILITOT", "PROT", "ALBUMIN" in the last 72 hours.  No results for input(s): "LIPASE", "AMYLASE" in the last 72 hours. CBC: Recent Labs    03/13/24 0412 03/14/24 0340  WBC 8.8 9.6  HGB 10.4* 10.8*  HCT 31.6* 32.8*  MCV 99.7 98.8  PLT 220 251   Cardiac Enzymes: No results for input(s): "CKTOTAL", "CKMB", "CKMBINDEX", "TROPONINIHS" in the last 72 hours.  BNP: No results for input(s): "BNP" in the last 72 hours.  D-Dimer: No results for input(s): "DDIMER" in the last 72 hours. Hemoglobin A1C: No results for input(s): "HGBA1C" in the last 72 hours. Fasting Lipid Panel: No results for input(s): "CHOL", "HDL", "LDLCALC", "TRIG", "CHOLHDL", "LDLDIRECT" in the last 72 hours. Thyroid Function Tests: No results for input(s): "TSH", "T4TOTAL", "T3FREE", "THYROIDAB" in the last 72 hours.  Invalid input(s): "FREET3"  Anemia Panel: No results for input(s): "VITAMINB12", "FOLATE", "FERRITIN", "TIBC", "IRON", "RETICCTPCT" in the last 72 hours.  DG Abd 1 View Result Date: 03/13/2024 CLINICAL DATA:  Pain.  [id number] Pain [id number] EXAM: ABDOMEN - 1 VIEW COMPARISON:  CT abdomen 03/12/2024 FINDINGS: Again noted are dilated loops of colon particularly the right colon. Oral contrast appears to be in the left side of  the transverse colon and near the splenic flexure. Elevation of the right hemidiaphragm. Limited evaluation for free air on the supine images. The degree of bowel distension is similar to the CT topogram images from 03/12/2024. Degenerative changes in the lower thoracic spine. IMPRESSION: 1. Persistent dilated loops of colon particularly the right colon. Findings could be associated with a colonic ileus. Bowel gas distension is similar to the  exam on 03/12/2024. 2. Oral contrast has moved into the splenic flexure region. Electronically Signed   By: Richarda Overlie M.D.   On: 03/13/2024 13:22   CT ABDOMEN PELVIS WO CONTRAST Result Date: 03/12/2024 CLINICAL DATA:  Acute abdominal pain EXAM: CT ABDOMEN AND PELVIS WITHOUT CONTRAST TECHNIQUE: Multidetector CT imaging of the abdomen and pelvis was performed following the standard protocol without IV contrast. RADIATION DOSE REDUCTION: This exam was performed according to the departmental dose-optimization program which includes automated exposure control, adjustment of the mA and/or kV according to patient size and/or use of iterative reconstruction technique. COMPARISON:  03/09/2024 FINDINGS: Lower chest: Lung bases demonstrate mild atelectatic changes slightly progressed in the interval from the prior exam. No sizable effusion is noted. Hepatobiliary: Liver is within normal limits. Gallbladder demonstrates dependent density which may be related to vicarious excretion of contrast. No definitive calculi are seen. Pancreas: Unremarkable. No pancreatic ductal dilatation or surrounding inflammatory changes. Spleen: Normal in size without focal abnormality. Adrenals/Urinary Tract: Adrenal glands are within normal limits. Kidneys show mild scarring on the left. Stable right renal cyst is noted. No follow-up is recommended. No renal calculi or obstructive changes are noted. The bladder is well distended. Stomach/Bowel: Scattered fecal material is noted throughout the colon. Mild gaseous distension of the colon is seen without obstructive change. This may contribute to the patient's underlying discomfort. The appendix is not well visualized. No inflammatory changes to suggest appendicitis are noted. Stomach is within normal limits. Small bowel shows no obstructive change. Vascular/Lymphatic: Aortic atherosclerosis. No enlarged abdominal or pelvic lymph nodes. Reproductive: Prostate is unremarkable. Other: No abdominal  wall hernia or abnormality. No abdominopelvic ascites. Musculoskeletal: No acute or significant osseous findings. IMPRESSION: Slight increase in gaseous distension of the colon without obstructive lesion. This could represent an early colonic ileus. Mild dependent density within the gallbladder which may represent vicarious excretion of contrast. Slight progression in basilar atelectasis. Electronically Signed   By: Alcide Clever M.D.   On: 03/12/2024 20:16     ECHO as above  TELEMETRY reviewed by me 03/14/24: Sinus rhythm rate 90  EKG reviewed by me 03/14/24: sinus tachycardia 1st degree AVB rate 110 bpm  DATA reviewed by me 03/14/24: last 24h vitals tele labs imaging I/O, hospitalist progress note  Principal Problem:   Severe sepsis with acute organ dysfunction (HCC) Active Problems:   CAD (coronary artery disease)   COPD (chronic obstructive pulmonary disease) (HCC)   Dyslipidemia   Hyperlipidemia, unspecified   Hypertension   Personal history of gout   Primary osteoarthritis of both knees   Elevated serum creatinine   Elevated troponin   Fever   Thyroid nodule greater than or equal to 1.5 cm in diameter incidentally noted on imaging study   Acute hypoxic respiratory failure (HCC)   Gaseous abdominal distention    ASSESSMENT AND PLAN: Hansford Hirt is a 83 y.o. male with a past medical history of CAD (2006 Anterior STEMI-LAD PCI, 2010 LAD PCI, 6/202 LCX, LAD and OM1 PCI), chronic HFrEF, HTN and dyslipidemia  who presented to the ED on 03/09/2024  for fever and SOB. Troponins checked and found to be mildly elevated, cardiology was consulted for further evaluation.   # Demand ischemia # Coronary artery disease # Severe sepsis # ?Ileus Patient with hx of CAD presented to the ED with SOB and fevers, found to be septic and started on IV antibiotics. Troponins checked and found to be elevated, reports pleuritic CP.  Echo this admission with EF 40%, regional wall motion  abnormalities similar to prior echoes. -Given other current issues and no complaints of chest pain will defer any further cardiac diagnostic workup at this time. -Home BP meds held given borderline BP and elevated Cr. Resume as able.  -Continue rosuvastatin 20 mg daily and aspirin 81 mg daily.  -Further management per primary team.   This patient's case was discussed and created with Dr. Corky Sing and he is in agreement.  Signed:  Gale Journey, PA-C  03/14/2024, 9:18 AM [facility]

## 2024-03-14 NOTE — Progress Notes (Signed)
 Physical Therapy Treatment Patient Details Name: Todd Rojas MRN: [id number] DOB: February 17, 1941 Today's Date: 03/14/2024   History of Present Illness Pt is an 83 year old male   MD work up including sepsis, acute hypoxic respiratory failure, PNA    Pmh significant for CKD stage IIIa, chronic gout, hypertension, hyperlipidemia, interstitial pulmonary disease, bronchiectasis, COPD    PT Comments  Pt alert, OT at bedside. Co-treat to maximize pt function, safety, and due to activity tolerance/endurance. Supine to sit maxA with use of bed rails, 2nd assist for safety. Fair sitting balance. Pt needed extended rest breaks due to fatigue, pain, and complaints of hand, testicle, and knee swelling, RN notified of pt status. Sit <> stand with RW and modAx2. He was able to ambulate 33ft, and after a seated rest break, 48ft. Pt needed constant encouragement and cues to improve gait quality and motivate. Pt in room with family at bedside at end of session. The patient would benefit from further skilled PT intervention to continue to progress towards goals.    If plan is discharge home, recommend the following: A lot of help with walking and/or transfers;A lot of help with bathing/dressing/bathroom;Assistance with cooking/housework;Assist for transportation;Help with stairs or ramp for entrance;Direct supervision/assist for medications management   Can travel by private vehicle     No  Equipment Recommendations  Rolling walker (2 wheels)    Recommendations for Other Services       Precautions / Restrictions Precautions Precautions: Fall Recall of Precautions/Restrictions: Intact Restrictions Weight Bearing Restrictions Per Provider Order: No     Mobility  Bed Mobility Overal bed mobility: Needs Assistance Bed Mobility: Sit to Supine     Supine to sit: Max assist, +2 for safety/equipment, HOB elevated          Transfers Overall transfer level: Needs assistance Equipment used:  Rolling walker (2 wheels) Transfers: Sit to/from Stand Sit to Stand: Mod assist, +2 physical assistance           General transfer comment: difficulty due to bilateral knee pain, max encouragement    Ambulation/Gait Ambulation/Gait assistance: Contact guard assist Gait Distance (Feet):  (4ft, 45ft) Assistive device: Rolling walker (2 wheels)         General Gait Details: shuffled step, able to improve minimally with verbal cues for bigger steps   Stairs             Wheelchair Mobility     Tilt Bed    Modified Rankin (Stroke Patients Only)       Balance Overall balance assessment: Needs assistance Sitting-balance support: Feet supported, Bilateral upper extremity supported Sitting balance-Leahy Scale: Fair     Standing balance support: Bilateral upper extremity supported, Reliant on assistive device for balance Standing balance-Leahy Scale: Poor                              Communication Communication Factors Affecting Communication: Hearing impaired  Cognition Arousal: Alert Behavior During Therapy: WFL for tasks assessed/performed   PT - Cognitive impairments: No apparent impairments                         Following commands: Intact      Cueing Cueing Techniques: Verbal cues, Tactile cues  Exercises      General Comments        Pertinent Vitals/Pain Pain Assessment Pain Assessment: Faces Faces Pain Scale: Hurts whole lot Pain Location: stomach,  B knees Pain Descriptors / Indicators: Aching, Grimacing, Guarding, Sore, Moaning Pain Intervention(s): Limited activity within patient's tolerance, Monitored during session, Repositioned    Home Living                          Prior Function            PT Goals (current goals can now be found in the care plan section) Progress towards PT goals: Progressing toward goals    Frequency    Min 2X/week      PT Plan      Co-evaluation PT/OT/SLP  Co-Evaluation/Treatment: Yes Reason for Co-Treatment: To address functional/ADL transfers;For patient/therapist safety PT goals addressed during session: Mobility/safety with mobility OT goals addressed during session: ADL's and self-care      AM-PAC PT "6 Clicks" Mobility   Outcome Measure  Help needed turning from your back to your side while in a flat bed without using bedrails?: A Lot Help needed moving from lying on your back to sitting on the side of a flat bed without using bedrails?: A Lot Help needed moving to and from a bed to a chair (including a wheelchair)?: A Lot Help needed standing up from a chair using your arms (e.g., wheelchair or bedside chair)?: A Lot Help needed to walk in hospital room?: A Lot Help needed climbing 3-5 steps with a railing? : Total 6 Click Score: 11    End of Session Equipment Utilized During Treatment: Gait belt Activity Tolerance: Patient limited by pain Patient left: in chair;with call bell/phone within reach;with family/visitor present Nurse Communication: Mobility status PT Visit Diagnosis: Other abnormalities of gait and mobility (R26.89);Difficulty in walking, not elsewhere classified (R26.2);Muscle weakness (generalized) (M62.81)     Time: 1119-1140 PT Time Calculation (min) (ACUTE ONLY): 21 min  Charges:    $Therapeutic Activity: 8-22 mins PT General Charges $$ ACUTE PT VISIT: 1 Visit                     Olga Coaster PT, DPT 1:06 PM,03/14/24

## 2024-03-15 ENCOUNTER — Inpatient Hospital Stay

## 2024-03-15 DIAGNOSIS — A419 Sepsis, unspecified organism: Secondary | ICD-10-CM | POA: Diagnosis not present

## 2024-03-15 DIAGNOSIS — R6520 Severe sepsis without septic shock: Secondary | ICD-10-CM | POA: Diagnosis not present

## 2024-03-15 DIAGNOSIS — K5981 Ogilvie syndrome: Secondary | ICD-10-CM

## 2024-03-15 LAB — BASIC METABOLIC PANEL WITH GFR
Anion gap: 9 (ref 5–15)
BUN: 30 mg/dL — ABNORMAL HIGH (ref 8–23)
CO2: 27 mmol/L (ref 22–32)
Calcium: 8.2 mg/dL — ABNORMAL LOW (ref 8.9–10.3)
Chloride: 98 mmol/L (ref 98–111)
Creatinine, Ser: 1.5 mg/dL — ABNORMAL HIGH (ref 0.61–1.24)
GFR, Estimated: 46 mL/min — ABNORMAL LOW (ref >60)
Glucose, Bld: 108 mg/dL — ABNORMAL HIGH (ref 70–99)
Potassium: 3.8 mmol/L (ref 3.5–5.1)
Sodium: 134 mmol/L — ABNORMAL LOW (ref 135–145)

## 2024-03-15 LAB — GLUCOSE, CAPILLARY: Glucose-Capillary: 114 mg/dL — ABNORMAL HIGH (ref 70–99)

## 2024-03-15 LAB — MRSA NEXT GEN BY PCR, NASAL: MRSA by PCR Next Gen: NOT DETECTED

## 2024-03-15 MED ORDER — METOPROLOL SUCCINATE ER 25 MG PO TB24
25.0000 mg | ORAL_TABLET | Freq: Every day | ORAL | Status: DC
  Administered 2024-03-15 – 2024-03-20 (×5): 25 mg via ORAL
  Filled 2024-03-15 (×6): qty 1

## 2024-03-15 MED ORDER — TRIAMCINOLONE 0.1 % CREAM:EUCERIN CREAM 1:1: TOPICAL_CREAM | Freq: Two times a day (BID) | CUTANEOUS | Status: DC

## 2024-03-15 MED ORDER — SODIUM CHLORIDE 0.9 % IV SOLN: INTRAVENOUS | Status: AC

## 2024-03-15 MED ORDER — DIPHENHYDRAMINE HCL 12.5 MG/5ML PO ELIX
12.5000 mg | ORAL_SOLUTION | Freq: Once | ORAL | Status: AC
  Administered 2024-03-16: 12.5 mg via ORAL
  Filled 2024-03-15 (×2): qty 5

## 2024-03-15 MED ORDER — IOHEXOL 300 MG/ML  SOLN
30.0000 mL | Freq: Once | INTRAMUSCULAR | Status: AC | PRN
  Administered 2024-04-02: 30 mL via ORAL

## 2024-03-15 MED ORDER — CHLORHEXIDINE GLUCONATE CLOTH 2 % EX PADS
6.0000 | MEDICATED_PAD | Freq: Every day | CUTANEOUS | Status: DC
  Administered 2024-03-15 – 2024-04-09 (×25): 6 via TOPICAL

## 2024-03-15 MED ORDER — IOHEXOL 300 MG/ML  SOLN
100.0000 mL | Freq: Once | INTRAMUSCULAR | Status: AC | PRN
  Administered 2024-03-15: 100 mL via INTRAVENOUS

## 2024-03-15 MED ORDER — PREDNISONE 20 MG PO TABS
40.0000 mg | ORAL_TABLET | Freq: Every day | ORAL | Status: DC
  Administered 2024-03-15 – 2024-03-19 (×5): 40 mg via ORAL
  Filled 2024-03-15 (×5): qty 2

## 2024-03-15 NOTE — Progress Notes (Addendum)
 Occupational Therapy Treatment Patient Details Name: Todd Rojas MRN: [id number] DOB: [birth year] Today's Date: 03/15/2024   History of present illness Pt is an 83 year old male   MD work up including sepsis, acute hypoxic respiratory failure, PNA    Pmh significant for CKD stage IIIa, chronic gout, hypertension, hyperlipidemia, interstitial pulmonary disease, bronchiectasis, COPD   OT comments  Chart reviewed to date, pt greeted in care of PT in chair, agreeable to OT tx session targeting improving functional activity tolerance in the setting of ADL tasks. Improvements noted on this date with pt amb in hallway 60' 2x with RW, CGA-MIN A +2. Mobility attempts appear less effortful on this date, however pt still grimacing with STS. Toilet transfer with MIN-MOD A, toileting with MAX A via STS. Pt is making progress towards goals, discharge remains appropriate.       If plan is discharge home, recommend the following:  A lot of help with walking and/or transfers;A lot of help with bathing/dressing/bathroom   Equipment Recommendations  BSC/3in1;Tub/shower seat    Recommendations for Other Services      Precautions / Restrictions Precautions Precautions: Fall Recall of Precautions/Restrictions: Intact Restrictions Weight Bearing Restrictions Per Provider Order: No       Mobility Bed Mobility               General bed mobility comments: NT in chair pre/post session    Transfers Overall transfer level: Needs assistance Equipment used: Rolling walker (2 wheels) Transfers: Sit to/from Stand Sit to Stand: Mod assist                 Balance Overall balance assessment: Needs assistance Sitting-balance support: Feet supported, Bilateral upper extremity supported Sitting balance-Leahy Scale: Fair     Standing balance support: Bilateral upper extremity supported, Reliant on assistive device for balance Standing balance-Leahy Scale: Poor                              ADL either performed or assessed with clinical judgement   ADL Overall ADL's : Needs assistance/impaired     Grooming: Set up;Sitting                   Toilet Transfer: Minimal assistance;Rolling walker (2 wheels);Cueing for safety;Cueing for sequencing;Moderate assistance;Regular Toilet   Toileting- Clothing Manipulation and Hygiene: Maximal assistance;Sit to/from stand Toileting - Clothing Manipulation Details (indicate cue type and reason): continent BM on toilet     Functional mobility during ADLs: Contact guard assist-MIN A;+2 for physical assistance;Cueing for sequencing;Rolling walker (2 wheels) (60' 2x)      Extremity/Trunk Assessment              Vision       Perception     Praxis     Communication Communication Communication: Impaired Factors Affecting Communication: Hearing impaired   Cognition Arousal: Alert Behavior During Therapy: WFL for tasks assessed/performed Cognition: No apparent impairments Difficult to assess due to: Hard of hearing/deaf           OT - Cognition Comments: continued improved participation                 Following commands: Intact Following commands impaired: Follows one step commands with increased time      Cueing   Cueing Techniques: Verbal cues, Tactile cues  Exercises Other Exercises Other Exercises: edu re: role of OT, importance of continued progression of mobility    Shoulder  Instructions       General Comments edema appears improved    Pertinent Vitals/ Pain       Pain Assessment Pain Assessment: Faces Faces Pain Scale: Hurts even more Pain Location: stomach, B knees Pain Descriptors / Indicators: Aching, Grimacing, Guarding, Sore, Moaning Pain Intervention(s): Monitored during session, Repositioned, Limited activity within patient's tolerance  Home Living                                          Prior Functioning/Environment               Frequency  Min 2X/week        Progress Toward Goals  OT Goals(current goals can now be found in the care plan section)  Progress towards OT goals: Progressing toward goals  Acute Rehab OT Goals Time For Goal Achievement: 03/27/24  Plan      Co-evaluation    PT/OT/SLP Co-Evaluation/Treatment: Yes Reason for Co-Treatment: Complexity of the patient's impairments (multi-system involvement) PT goals addressed during session: Mobility/safety with mobility OT goals addressed during session: Proper use of Adaptive equipment and DME;ADL's and self-care      AM-PAC OT "6 Clicks" Daily Activity     Outcome Measure   Help from another person eating meals?: None Help from another person taking care of personal grooming?: A Little Help from another person toileting, which includes using toliet, bedpan, or urinal?: A Lot Help from another person bathing (including washing, rinsing, drying)?: A Lot Help from another person to put on and taking off regular upper body clothing?: A Lot Help from another person to put on and taking off regular lower body clothing?: A Lot 6 Click Score: 15    End of Session Equipment Utilized During Treatment: Rolling walker (2 wheels)  OT Visit Diagnosis: Other abnormalities of gait and mobility (R26.89)   Activity Tolerance Patient tolerated treatment well;Patient limited by pain   Patient Left in chair;with call bell/phone within reach;with chair alarm set;with family/visitor present   Nurse Communication Mobility status (BM via secure chat)        Time: 1610-9604 OT Time Calculation (min): 24 min  Charges: OT General Charges $OT Visit: 1 Visit OT Treatments $Self Care/Home Management : 8-22 mins  Oleta Mouse, OTD OTR/L  03/15/24, 1:04 PM

## 2024-03-15 NOTE — Plan of Care (Signed)
 The patient arrives to the AR-ICU/SDU at 1902 hours this evening. Nursing hand-off received from [place], [place]. The patient is receiving MIVF but no other active infusions. The patient is requiring 3 L / min of supplemental O2 via [place]. The patient is in no obvious acute distress. The patient has a FMS in place upon arrival to the unit. Diffuse red, patchy rash from clavicles to feet also present upon arrival. Plan for intervention with GI team in near term future.   Problem: Fluid Volume: Goal: Hemodynamic stability will improve Outcome: Progressing   Problem: Clinical Measurements: Goal: Diagnostic test results will improve Outcome: Progressing Goal: Signs and symptoms of infection will decrease Outcome: Progressing   Problem: Respiratory: Goal: Ability to maintain adequate ventilation will improve Outcome: Progressing   Problem: Education: Goal: Knowledge of General Education information will improve Description: Including pain rating scale, medication(s)/side effects and non-pharmacologic comfort measures Outcome: Progressing   Problem: Health Behavior/Discharge Planning: Goal: Ability to manage health-related needs will improve Outcome: Progressing   Problem: Clinical Measurements: Goal: Ability to maintain clinical measurements within normal limits will improve Outcome: Progressing Goal: Will remain free from infection Outcome: Progressing Goal: Diagnostic test results will improve Outcome: Progressing Goal: Respiratory complications will improve Outcome: Progressing Goal: Cardiovascular complication will be avoided Outcome: Progressing   Problem: Activity: Goal: Risk for activity intolerance will decrease Outcome: Progressing   Problem: Nutrition: Goal: Adequate nutrition will be maintained Outcome: Progressing   Problem: Coping: Goal: Level of anxiety will decrease Outcome: Progressing   Problem: Elimination: Goal: Will not experience complications related to bowel  motility Outcome: Progressing Goal: Will not experience complications related to urinary retention Outcome: Progressing   Problem: Pain Managment: Goal: General experience of comfort will improve and/or be controlled Outcome: Progressing   Problem: Safety: Goal: Ability to remain free from injury will improve Outcome: Progressing   Problem: Skin Integrity: Goal: Risk for impaired skin integrity will decrease Outcome: Progressing

## 2024-03-15 NOTE — Consult Note (Signed)
 GI Inpatient Consult Note  Reason for Consult: Ogilvie Syndrome   Attending Requesting Consult: Dr. Darlin Priestly  History of Present Illness: Todd Rojas is a 83 y.o. 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 Darlin Priestly. Patient has a PMH of obesity, HTN, HLD, chronic gout, CKD Stage IIIa, interstitial pulmonary disease, bronchiectasis, COPD, CAD, hx of MI, CHF, OA, and OSA. He presented to the [facility] ED 3/22 for chief complaint of 2-week history of fever, shortness of breath, and fatigue. He had been seen recently by [facility] and primary team for symptoms and has been given multiple antibiotics with unknown source of infection. Work-up in the ED was concerning for fever and sepsis. Initial imaging concerning for pneumonia and he was treated with multiple broad-spectrum antibiotics. He had elevated hs-troponins and has been followed by Cardiology. During the course of admission he began to complain of severe abdominal distention and obstipation. The last 48 hours his bowels have not been moving well. CT abd/pelvis performed 3 days ago commented on increased gaseous distention of colon without obstructive lesion suggesting early colonic ileus. KUB performed this morning commented on significant dilation of colon with cecum ~14 cm. General Surgery and GI were consulted this morning for further evaluation.   Patient seen and examined this afternoon sitting up in chair. His wife and daughter are present in the room with him. They have noted a new rash over the last 24 hours. He reports he is miserable currently. He is complaining of severe abdominal distention. He is not currently passing flatus. He did have small BM today per wife and daughter in room. No gross GI bleeding. He has been ordered to drink 2 bottles of oral contrast for repeat CT scan this afternoon. Rectal tube has not been attempted. No recent abdominal surgeries. He is not on any  chronic narcotics. He feels very weak. He is nauseous. He has not had any vomiting.    Summary of GI Procedures:  CSY 01/06/2023 ([place]) - sigmoid diverticulosis, otherwise normal examined colon, Grade I IH  CSY 10/08/2018 Mechele Collin) - one diminutive TA removed from ascending colon, one small TA removed from ascending colon, one diminutive TA removed from hepatic flexure    Past Medical History:  Past Medical History:  Diagnosis Date   Actinic keratosis    Arthritis    Asthma    Basal cell carcinoma 03/17/2020   right ant lat deltoid   CAD (coronary artery disease) 02/13/2012   Overview:   12/2004 - Anginal equivalent symptoms:  Left arm pain, weakness, and shortness of breath.   12/2004:  Anterior STEMI.  Transferred to [facility].   01/06/2005 - Cardiac cath:  EF 51%, an occluded mid LAD and a large OM2 with 75% ostial stenosis.  PCI of the mid occluded LAD using a Cypher stent.   03/2005 - 2D echo:  EF >55%, LAE 4.7 cm, no significant valvular disease.   02/2008:  Plavix stopped.   06/2008:  Recurrent angina - symptoms of chest tightness.  Presented to [facility].  Cardiac catheterization.  The patient stated nonobstructive CAD seen.  Plavix restarted at that time.   03/2009 - 2D echo:  EF 35%, akinetic apex and septum.  Stress echocardiogram deferred for cardiac catheterization.   03/30/2009:  PCI 90% proximal LAD with a 3.0 x 18 mm Xience DES.   02/2010: Slow and gradual return of chest tightness, fatigue and generalized weakness  concerning for return of anginal equivalent.  Stress test did not show ischemia to the heart muscle during exercise.  Evidenc   CHF (congestive heart failure) (HCC)    Chronic kidney disease    kidney stone left side   COPD (chronic obstructive pulmonary disease) (HCC) 07/03/2017   Dyslipidemia 02/13/2012   Overview:   03/2009  TC 158, TG 88, HDL 39, LDL 102.  11/2013  TC 148, TG 115, HDL 44.3, LDL 80.7 (done at PCP's office)   10/2014 TC 146   11/2015 TC 123, TG 86, HDL 41.7, LDL 64  11/2016 TC 140, TG 129, HDL 40.1, LDL 74    Dyspnea    GERD (gastroesophageal reflux disease)    GI disease 03/09/2013   Overview:   Dysphagia - EGD demonstrated esophageal stricture which was dilated   GERD   Hemorrhoids    Hearing loss 03/09/2013   History of kidney stones    Hyperlipidemia, unspecified 07/03/2017   Hypertension 02/13/2012   Myocardial infarction [facility]) 2004   Personal history of gout 12/09/2016   Primary osteoarthritis of both knees 05/05/2015   Right-sided low back pain without sciatica 04/14/2016   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   R ear - ED&C    Problem List: Patient Active Problem List   Diagnosis Date Noted   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   Gaseous abdominal distention 03/13/2024   Thyroid nodule greater than or equal to 1.5 cm in diameter incidentally noted on imaging study 03/10/2024   Acute hypoxic respiratory failure (HCC) 03/10/2024   Severe sepsis with acute organ dysfunction (HCC) 03/09/2024   Elevated serum creatinine 03/09/2024   Elevated troponin 03/09/2024   Fever 03/09/2024   Sepsis (HCC) 08/16/2017   Acute renal failure with tubular necrosis (HCC)    Gross hematuria    COPD (chronic obstructive pulmonary disease) (HCC) 07/03/2017   Hyperlipidemia, unspecified 07/03/2017   Personal history of gout 12/09/2016   Status post left unicompartmental knee replacement 11/03/2016   Right-sided low back pain without sciatica 04/14/2016   Status post left partial knee replacement 12/23/2015   Right hip pain 07/30/2015   Status post right partial knee replacement 07/30/2015   Primary osteoarthritis of both knees 05/05/2015   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   GI disease 03/09/2013   Hearing loss 03/09/2013   CAD (coronary artery disease) 02/13/2012   Dyslipidemia 02/13/2012   Hypertension 02/13/2012    Past Surgical History: Past Surgical History:  Procedure Laterality Date   colonoscopy with  polypectomy     COLONOSCOPY WITH PROPOFOL N/A 10/08/2018   Procedure: COLONOSCOPY WITH PROPOFOL;  Surgeon: Scot Jun, MD;  Location: [facility] ENDOSCOPY;  Service: Endoscopy;  Laterality: N/A;   COLONOSCOPY WITH PROPOFOL N/A 01/06/2023   Procedure: COLONOSCOPY WITH PROPOFOL;  Surgeon: Regis Bill, MD;  Location: [facility] ENDOSCOPY;  Service: Endoscopy;  Laterality: N/A;   coronary stents     CYSTOSCOPY WITH STENT PLACEMENT Left 08/09/2017   Procedure: CYSTOSCOPY WITH STENT PLACEMENT;  Surgeon: Hildred Laser, MD;  Location: [facility] ORS;  Service: Urology;  Laterality: Left;   MEDIAL PARTIAL KNEE REPLACEMENT Bilateral 2016   URETEROSCOPY WITH HOLMIUM LASER LITHOTRIPSY Left 08/09/2017   Procedure: URETEROSCOPY WITH HOLMIUM LASER LITHOTRIPSY/INCISION OF URETEROCELE;  Surgeon: Hildred Laser, MD;  Location: [facility] ORS;  Service: Urology;  Laterality: Left;    Allergies: No Known Allergies  Home Medications: Medications Prior to Admission  Medication Sig Dispense Refill Last Dose/Taking   acetaminophen (TYLENOL) 500 MG tablet Take 1,000  mg by mouth every 8 (eight) hours as needed for mild pain.   Taking As Needed   albuterol (PROVENTIL HFA;VENTOLIN HFA) 108 (90 Base) MCG/ACT inhaler Inhale 1 puff into the lungs every 6 (six) hours as needed for wheezing or shortness of breath.   Taking As Needed   allopurinol (ZYLOPRIM) 300 MG tablet Take 300 mg by mouth daily.    03/08/2024 Morning   aspirin EC 81 MG tablet Take 81 mg by mouth daily.   03/08/2024 Morning   [EXPIRED] baclofen (LIORESAL) 10 MG tablet Take 10 mg by mouth 3 (three) times daily as needed for muscle spasms.   Past Week   budesonide-formoterol (SYMBICORT) 160-4.5 MCG/ACT inhaler Inhale into the lungs.   03/08/2024 Evening   colchicine 0.6 MG tablet Take 0.6 mg by mouth daily.   Taking   furosemide (LASIX) 20 MG tablet Take 20 mg by mouth daily.   Past Week   levofloxacin (LEVAQUIN) 500 MG tablet Take 500 mg by mouth daily.    03/08/2024 Morning   lisinopril (ZESTRIL) 20 MG tablet Take 20 mg by mouth daily.   03/08/2024   nitroGLYCERIN (NITROSTAT) 0.4 MG SL tablet Place under the tongue.   Taking   rosuvastatin (CRESTOR) 20 MG tablet Take 20 mg by mouth daily.   03/08/2024 Morning   vitamin B-12 (CYANOCOBALAMIN) 1000 MCG tablet Take 1,000 mcg by mouth daily.   03/08/2024 Morning   gabapentin (NEURONTIN) 300 MG capsule Take 1 capsule by mouth 2 (two) times daily. (Patient not taking: Reported on 03/09/2024)   Not Taking   UNABLE TO FIND Take 100 mg by mouth daily. Study drug: Aspirin 100 mg  Dr Allena Katz @@ [facility] is monitoring this study      VANISHPOINT TUBERCULIN SYRINGE 25G X 1" 1 ML MISC   5    Home medication reconciliation was completed with the patient.   Scheduled Inpatient Medications:    allopurinol  300 mg Oral Daily   aspirin EC  81 mg Oral Daily   cyanocobalamin  1,000 mcg Oral Daily   diclofenac Sodium  2 g Topical TID   enoxaparin (LOVENOX) injection  50 mg Subcutaneous Q24H   metoprolol succinate  25 mg Oral Daily   mometasone-formoterol  2 puff Inhalation BID   predniSONE  40 mg Oral Q breakfast   rosuvastatin  20 mg Oral QHS    Continuous Inpatient Infusions:    sodium chloride 50 mL/hr at 03/15/24 1437    PRN Inpatient Medications:  alum & mag hydroxide-simeth, dicyclomine, iohexol, nitroGLYCERIN, traMADol  Family History: family history includes Alzheimer's disease in his mother; Leukemia in his father.  The patient's family history is negative for inflammatory bowel disorders, GI malignancy, or solid organ transplantation.  Social History:   reports that he has quit smoking. He quit smokeless tobacco use about 29 years ago.  His smokeless tobacco use included chew. He reports that he does not drink alcohol and does not use drugs. The patient denies ETOH, tobacco, or drug use.   Review of Systems: Constitutional: Weight is stable.  Eyes: No changes in vision. ENT: No oral lesions, sore  throat.  GI: see HPI.  Heme/Lymph: No easy bruising.  CV: No chest pain.  GU: No hematuria.  Integumentary: No rashes.  Neuro: No headaches.  Psych: No depression/anxiety.  Endocrine: No heat/cold intolerance.  Allergic/Immunologic: No urticaria.  Resp: No cough, SOB.  Musculoskeletal: No joint swelling.    Physical Examination: BP (!) 106/50 (BP Location: Left Arm)  Pulse 82   Temp 97.6 F (36.4 C)   Resp 16   Ht 5\' 9"  (1.753 m)   Wt 97.5 kg   SpO2 98%   BMI 31.75 kg/m  Gen: NAD, alert and oriented x 4 HEENT: PEERLA, EOMI, Neck: supple, no JVD or thyromegaly Chest: CTA bilaterally, no wheezes, crackles, or other adventitious sounds CV: RRR, no m/g/c/r Abd: markedly distended abdomen without significant tenderness to palpation, no rebound or guarding. No organomegaly.  Ext: no edema, well perfused with 2+ pulses, Skin: no rash or lesions noted Lymph: no LAD  Data: Lab Results  Component Value Date   WBC 9.6 03/14/2024   HGB 10.8 (L) 03/14/2024   HCT 32.8 (L) 03/14/2024   MCV 98.8 03/14/2024   PLT 251 03/14/2024   Recent Labs  Lab 03/12/24 0424 03/13/24 0412 03/14/24 0340  HGB 11.4* 10.4* 10.8*   Lab Results  Component Value Date   NA 134 (L) 03/15/2024   K 3.8 03/15/2024   CL 98 03/15/2024   CO2 27 03/15/2024   BUN 30 (H) 03/15/2024   CREATININE 1.50 (H) 03/15/2024   Lab Results  Component Value Date   ALT 14 03/09/2024   AST 20 03/09/2024   ALKPHOS 40 03/09/2024   BILITOT 0.9 03/09/2024   Recent Labs  Lab 03/09/24 1610  APTT 36    Assessment/Plan:  83 y/o male with a PMH of obesity, HTN, HLD, chronic gout, CKD Stage IIIa, interstitial pulmonary disease, bronchiectasis, COPD, CAD, hx of MI, CHF, OA, and OSA presented to the [facility] ED 3/22 for chief complaint of 2-week history of fevers, chills, shortness of breath, and fatigue. He was found to have severe sepsis presumed 2/2 RLL pneumonia s/p broad-spectrum antibiotics. Over the past 72-hours  patient has showed severe abdominal distention with obstipation symptoms. Imaging c/w markedly dilated cecum ~14 cm c/w diagnosis of Ogilvie's Syndrome. GI consulted for further evaluation and management.   Ogilvie's Syndrome (acute colonic pseudo-obstruction)  Sepsis 2/2 RLL pneumonia   Elevated troponin 2/2 demand ischemia  CAD  COPD  DNR -limited status   Recommendations:  - Supportive care per primary team - Continue to monitor serum electrolytes and correct as needed  - Agree with attempting rectal tube for colonic decompression as tolerated - Follow-up results of repeat CT scan abd/pelvis this afternoon - Next step in management of Ogilvie's after failing 72 hours of conservative management is the addition of Neostigmine 2 mg delivered by slow intravenous injection over five minutes, with continuous monitoring of vital signs and EKG for 30 minutes and continuous clinical assessment for 15 to 30 minutes. He should be kept supine on a bedpan, and atropine should be available at the bedside to treat bradycardia associated with neostigmine.  - Recommend transfer to ICU for closer monitoring  - If patient fails multiple trials of neostigmine, next step would be colonoscopic decompression with using water infusion and minimal to no insufflation. Colonoscopy in this setting is technically difficult and associated with higher rate of perforation (~3%) - Repeat serial abdominal examinations. Monitor for any signs of colonic ischemia versus colonic perforation - NPO status for now - Avoid narcotics and anticholinergic medications  - Appreciate cardiology recommendations regarding administration of neostigmine/side effects - Encouraged patient to ambulate as tolerated with walker - Frequent position changes recommended - [name] and myself following along with you   All of the patient's questions were answered to the best of my ability. Family is in agreement with above plan of  care.    Thank you for the consult. Please call with questions or concerns.  Gilda Crease, PA-C [facility] [phone]

## 2024-03-15 NOTE — Consult Note (Signed)
 [facility] SURGICAL CONSULTATION NOTE (initial) - cpt: 99254   HISTORY OF PRESENT ILLNESS (HPI):  83 y.o. male with multiple ED presentations on 03/19 and 03/22 secondary to 2 weeks of fever, SOB, and fatigue. He had been treated with Abx (Doxycycline & Levaquin) and followed by his PCP. No clear source prior to admission. Work up in the ED was concerning for fever and possible sepsis. Initaly imaging concerning for possible PNA. He was treated with Cefepime, Vancomycin, and Flagyl (currently on Levaquin). Also with high sensitivity troponin elevation followed by cardiology while inpatient. During the course of his admission, he had started to complain of abdominal distension. He has received bowel regimen without significant bowel function in last 48 hours; however, today reports a small amount of flatus and a small BM. He does report nausea/emesis.Of note, PMHx significant for COPD, asthma, CAD, history of MI, CHF, HTN, HLD. He is not on blood thinners aside from 81 mg ASA. No previous abdominal surgeries. Previous to this patient, and wife, report he had been quite active but does admit now to recent CP with activity. Most recent labs show a normal WBC at 9.6K, Hgb to 10.8, sCr at baseline 1.50, mild hyponatremia to 134. He is on CLD.  Surgery is consulted by hospitalist physician Dr. Darlin Priestly, MD in this context for evaluation and management of colonic ileus.   PAST MEDICAL HISTORY (PMH):  Past Medical History:  Diagnosis Date   Actinic keratosis    Arthritis    Asthma    Basal cell carcinoma 03/17/2020   right ant lat deltoid   CAD (coronary artery disease) 02/13/2012   Overview:   12/2004 - Anginal equivalent symptoms:  Left arm pain, weakness, and shortness of breath.   12/2004:  Anterior STEMI.  Transferred to [facility].   01/06/2005 - Cardiac cath:  EF 51%, an occluded mid LAD and a large OM2 with 75% ostial stenosis.  PCI of the mid occluded LAD using a Cypher stent.    03/2005 - 2D echo:  EF >55%, LAE 4.7 cm, no significant valvular disease.   02/2008:  Plavix stopped.   06/2008:  Recurrent angina - symptoms of chest tightness.  Presented to [facility].  Cardiac catheterization.  The patient stated nonobstructive CAD seen.  Plavix restarted at that time.   03/2009 - 2D echo:  EF 35%, akinetic apex and septum.  Stress echocardiogram deferred for cardiac catheterization.   03/30/2009:  PCI 90% proximal LAD with a 3.0 x 18 mm Xience DES.   02/2010: Slow and gradual return of chest tightness, fatigue and generalized weakness concerning for return of anginal equivalent.  Stress test did not show ischemia to the heart muscle during exercise.  Evidenc   CHF (congestive heart failure) (HCC)    Chronic kidney disease    kidney stone left side   COPD (chronic obstructive pulmonary disease) (HCC) 07/03/2017   Dyslipidemia 02/13/2012   Overview:   03/2009  TC 158, TG 88, HDL 39, LDL 102.  11/2013  TC 148, TG 115, HDL 44.3, LDL 80.7 (done at PCP's office)   10/2014 TC 146  11/2015 TC 123, TG 86, HDL 41.7, LDL 64  11/2016 TC 140, TG 129, HDL 40.1, LDL 74    Dyspnea    GERD (gastroesophageal reflux disease)    GI disease 03/09/2013   Overview:   Dysphagia - EGD demonstrated esophageal stricture which was dilated   GERD   Hemorrhoids    Hearing loss 03/09/2013  History of kidney stones    Hyperlipidemia, unspecified 07/03/2017   Hypertension 02/13/2012   Myocardial infarction [facility]) 2004   Personal history of gout 12/09/2016   Primary osteoarthritis of both knees 05/05/2015   Right-sided low back pain without sciatica 04/14/2016   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   R ear - ED&C     PAST SURGICAL HISTORY (PSH):  Past Surgical History:  Procedure Laterality Date   colonoscopy with polypectomy     COLONOSCOPY WITH PROPOFOL N/A 10/08/2018   Procedure: COLONOSCOPY WITH PROPOFOL;  Surgeon: Scot Jun, MD;  Location: [facility] ENDOSCOPY;   Service: Endoscopy;  Laterality: N/A;   COLONOSCOPY WITH PROPOFOL N/A 01/06/2023   Procedure: COLONOSCOPY WITH PROPOFOL;  Surgeon: Regis Bill, MD;  Location: [facility] ENDOSCOPY;  Service: Endoscopy;  Laterality: N/A;   coronary stents     CYSTOSCOPY WITH STENT PLACEMENT Left 08/09/2017   Procedure: CYSTOSCOPY WITH STENT PLACEMENT;  Surgeon: Hildred Laser, MD;  Location: [facility] ORS;  Service: Urology;  Laterality: Left;   MEDIAL PARTIAL KNEE REPLACEMENT Bilateral 2016   URETEROSCOPY WITH HOLMIUM LASER LITHOTRIPSY Left 08/09/2017   Procedure: URETEROSCOPY WITH HOLMIUM LASER LITHOTRIPSY/INCISION OF URETEROCELE;  Surgeon: Hildred Laser, MD;  Location: [facility] ORS;  Service: Urology;  Laterality: Left;     MEDICATIONS:  Prior to Admission medications   Medication Sig Start Date End Date Taking? Authorizing Provider  acetaminophen (TYLENOL) 500 MG tablet Take 1,000 mg by mouth every 8 (eight) hours as needed for mild pain.   Yes [provider]  albuterol (PROVENTIL HFA;VENTOLIN HFA) 108 (90 Base) MCG/ACT inhaler Inhale 1 puff into the lungs every 6 (six) hours as needed for wheezing or shortness of breath.   Yes [provider]  allopurinol (ZYLOPRIM) 300 MG tablet Take 300 mg by mouth daily.  01/31/17  Yes [provider]  aspirin EC 81 MG tablet Take 81 mg by mouth daily.   Yes [provider]  budesonide-formoterol (SYMBICORT) 160-4.5 MCG/ACT inhaler Inhale into the lungs. 04/22/21 03/09/24 Yes [provider]  colchicine 0.6 MG tablet Take 0.6 mg by mouth daily.   Yes [provider]  furosemide (LASIX) 20 MG tablet Take 20 mg by mouth daily.   Yes [provider]  levofloxacin (LEVAQUIN) 500 MG tablet Take 500 mg by mouth daily. 03/08/24 03/15/24 Yes [provider]  lisinopril (ZESTRIL) 20 MG tablet Take 20 mg by mouth daily. 02/15/21  Yes [provider]  nitroGLYCERIN (NITROSTAT) 0.4 MG SL tablet Place under  the tongue. 03/31/20  Yes [provider]  rosuvastatin (CRESTOR) 20 MG tablet Take 20 mg by mouth daily.   Yes [provider]  vitamin B-12 (CYANOCOBALAMIN) 1000 MCG tablet Take 1,000 mcg by mouth daily.   Yes [provider]  gabapentin (NEURONTIN) 300 MG capsule Take 1 capsule by mouth 2 (two) times daily. Patient not taking: Reported on 03/09/2024 02/21/24 02/20/25  [provider]  UNABLE TO FIND Take 100 mg by mouth daily. Study drug: Aspirin 100 mg  Dr Allena Katz @@ [facility] is monitoring this study    [provider]  VANISHPOINT TUBERCULIN SYRINGE 25G X 1" 1 ML MISC  07/05/17   [provider]     ALLERGIES:  No Known Allergies   SOCIAL HISTORY:  Social History   Socioeconomic History   Marital status: Married    Spouse name: Not on file   Number of children: Not on file   Years of education:  Not on file   Highest education level: Not on file  Occupational History   Not on file  Tobacco Use   Smoking status: Former   Smokeless tobacco: Former    Types: Chew    Quit date: 08/07/1994  Vaping Use   Vaping status: Never Used  Substance and Sexual Activity   Alcohol use: No   Drug use: No   Sexual activity: Not Currently  Other Topics Concern   Not on file  Social History Narrative   Not on file   Social Drivers of Health   Financial Resource Strain: Low Risk  (10/23/2023)   Received from [facility]   Overall Financial Resource Strain (CARDIA)    Difficulty of Paying Living Expenses: Not hard at all  Food Insecurity: No Food Insecurity (03/10/2024)   Hunger Vital Sign    Worried About Running Out of Food in the Last Year: Never true    Ran Out of Food in the Last Year: Never true  Transportation Needs: No Transportation Needs (03/10/2024)   PRAPARE - Administrator, Civil Service (Medical): No    Lack of Transportation (Non-Medical): No  Physical Activity: Not on file  Stress: Not on file   Social Connections: Socially Integrated (03/10/2024)   Social Connection and Isolation Panel [NHANES]    Frequency of Communication with Friends and Family: More than three times a week    Frequency of Social Gatherings with Friends and Family: More than three times a week    Attends Religious Services: More than 4 times per year    Active Member of [employer] or Organizations: Yes    Attends Engineer, structural: More than 4 times per year    Marital Status: Married  Catering manager Violence: Not At Risk (03/10/2024)   Humiliation, Afraid, Rape, and Kick questionnaire    Fear of Current or Ex-Partner: No    Emotionally Abused: No    Physically Abused: No    Sexually Abused: No     FAMILY HISTORY:  Family History  Problem Relation Age of Onset   Alzheimer's disease Mother    Leukemia Father    Prostate cancer Neg Hx    Kidney cancer Neg Hx    Bladder Cancer Neg Hx       REVIEW OF SYSTEMS:  Review of Systems  Constitutional:  Negative for chills and fever.  Respiratory:  Positive for shortness of breath. Negative for cough.   Cardiovascular:  Negative for chest pain and palpitations.  Gastrointestinal:  Positive for abdominal pain, constipation, nausea and vomiting. Negative for diarrhea.  Genitourinary:  Negative for dysuria and urgency.  All other systems reviewed and are negative.   VITAL SIGNS:  Temp:  [97.9 F (36.6 C)-98.8 F (37.1 C)] 97.9 F (36.6 C) (03/28 0849) Pulse Rate:  [88-101] 96 (03/28 0849) Resp:  [16-22] 16 (03/28 0849) BP: (116-136)/(55-76) 136/55 (03/28 0849) SpO2:  [92 %-97 %] 95 % (03/28 0849)     Height: 5\' 9"  (175.3 cm) Weight: 97.5 kg BMI (Calculated): 31.74   INTAKE/OUTPUT:  03/27 0701 - 03/28 0700 In: 120 [P.O.:120] Out: -   PHYSICAL EXAM:  Physical Exam Vitals and nursing note reviewed. Exam conducted with a chaperone present.  Constitutional:      General: He is not in acute distress.    Appearance: Normal appearance.      Comments: Sitting in chair, conversationally dyspneic, NAD. Wife at bedside   HENT:     Head:  Normocephalic and atraumatic.  Eyes:     General: No scleral icterus.    Conjunctiva/sclera: Conjunctivae normal.  Abdominal:     General: Abdomen is protuberant. There is distension.     Palpations: Abdomen is soft.     Tenderness: There is no abdominal tenderness. There is no guarding or rebound.     Comments: Abdomen is protuberant, markedly distended, no significant tenderness, no rebound.guarding. He is not overtly peritonitic  Genitourinary:    Comments: Deferred Musculoskeletal:     Right lower leg: Edema (Trace) present.     Left lower leg: Edema (Trace) present.  Skin:    General: Skin is warm and dry.     Coloration: Skin is not jaundiced.  Neurological:     General: No focal deficit present.     Mental Status: He is alert and oriented to person, place, and time.  Psychiatric:        Mood and Affect: Mood normal.        Behavior: Behavior normal.      Labs:     Latest Ref Rng & Units 03/14/2024    3:40 AM 03/13/2024    4:12 AM 03/12/2024    4:24 AM  CBC  WBC 4.0 - 10.5 K/uL 9.6  8.8  8.3   Hemoglobin 13.0 - 17.0 g/dL 41.3  24.4  01.0   Hematocrit 39.0 - 52.0 % 32.8  31.6  34.5   Platelets 150 - 400 K/uL 251  220  217       Latest Ref Rng & Units 03/15/2024    4:43 AM 03/14/2024    3:40 AM 03/13/2024    4:12 AM  CMP  Glucose 70 - 99 mg/dL 272  536  644   BUN 8 - 23 mg/dL 30  25  23    Creatinine 0.61 - 1.24 mg/dL 0.34  7.42  5.95   Sodium 135 - 145 mmol/L 134  132  131   Potassium 3.5 - 5.1 mmol/L 3.8  3.9  3.9   Chloride 98 - 111 mmol/L 98  98  102   CO2 22 - 32 mmol/L 27  24  24    Calcium 8.9 - 10.3 mg/dL 8.2  8.1  7.9      Imaging studies:   CT Abdomen/Pelvis (03/09/2024) personally reviewed without significant colonic distension, and radiologist report reviewed below:  IMPRESSION: 1. Portions of exam are mildly motion degraded. 2. Given this limitation, no  acute process in the abdomen or pelvis. 3. Right lower lobe airspace disease is new and could represent infection or atelectasis in the setting of right hemidiaphragm elevation. 4. Subtle hyperenhancing right prostatic nodule. Consider outpatient urology consultation to exclude carcinoma. 5. Hyperattenuation throughout the bladder is most likely early contrast excretion. Correlate with urinalysis to exclude bladder hemorrhage, felt less likely. 6. Incidental findings, including: Tiny hiatal hernia. Coronary artery atherosclerosis. Aortic Atherosclerosis (ICD10-I70.0). 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   CT Abdomen/Pelvis (03/12/2024) personally reviewed with increased colonic gas, no pneumatosis, and radiologist report reviewed below:  IMPRESSION: Slight increase in gaseous distension of the colon without obstructive lesion. This could represent an early colonic ileus.   Mild dependent density within the gallbladder which may represent vicarious excretion of contrast.   Slight progression in basilar atelectasis   KUB (03/15/2024) personally reviewed with significant dilation of colon, cecum ~14 cm, and radiologist report pending    Assessment/Plan:  84 y.o. 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   - I will plan to repeat CT Abdomen/Pelvis to ensure no missed intra-abdominal etiology to explain his colonic dilation  - He will benefit from rectal tube in attempts at colonic decompression  - Will discuss with GI as well; He likely requires neostigmine - Of course, will need cardiac monitoring   - For now, no need for emergent surgical intervention. Patient, and his wife, understand given the size of his cecum (>10 cm), he is at  increased risk of perforation. Surgery in this setting would almost certainly need sub total colectomy and likely ileostomy +/- mucus fistula. They understand, this would carry significant morbidity in this setting   - CLD is reasonable; low threshold for NPO  - Monitor abdominal examination; on-gong bowel function  - Pain control prn; limit narcotics as feasible - Appreciate cardiology input - Okay to mobilize as tolerated - Further management per primary service; we will follow    All of the above findings and recommendations were discussed with the patient and his family (wife at bedside), and all of their questions were answered to their expressed satisfaction.  Face-to-face time spent with the patient and care providers was 60 minutes, with more than 50% of the time spent counseling, educating, and coordinating care of the patient.    Thank you for the opportunity to participate in this patient's care.   -- Lynden Oxford, PA-C [facility] 03/15/2024, 11:36 AM M-F: 7am - 4pm

## 2024-03-15 NOTE — Progress Notes (Addendum)
 Physical Therapy Treatment Patient Details Name: Todd Rojas MRN: [id number] DOB: Sep 11, 1941 Today's Date: 03/15/2024   History of Present Illness Pt is an 83 year old male   MD work up including sepsis, acute hypoxic respiratory failure, PNA    Pmh significant for CKD stage IIIa, chronic gout, hypertension, hyperlipidemia, interstitial pulmonary disease, bronchiectasis, COPD    PT Comments  Pt in chair.  He does seated AROM prior to mobility.  Stands with heavy mod a x 1 to RW and is generally unsteady upon standing.  Pre-gait weight shifting and balance act.  When OT arrived, he is able to walk 60' x 2 with RW and cga/min a x 2.  He does stop at bathroom for small BM with cues and assist for task.  Burping frequently during session.  Pt is progressing with mobility but remains far from baseline of independent amb at home with no AD.  Remains highly motivated to improve with attentive wife.   If plan is discharge home, recommend the following: A lot of help with walking and/or transfers;A lot of help with bathing/dressing/bathroom;Assistance with cooking/housework;Assist for transportation;Help with stairs or ramp for entrance;Direct supervision/assist for medications management   Can travel by private vehicle        Equipment Recommendations  Rolling walker (2 wheels);BSC/3in1    Recommendations for Other Services       Precautions / Restrictions Precautions Precautions: Fall Recall of Precautions/Restrictions: Intact Restrictions Weight Bearing Restrictions Per Provider Order: No     Mobility  Bed Mobility               General bed mobility comments: in recliner before and after Patient Response: Cooperative  Transfers Overall transfer level: Needs assistance Equipment used: Rolling walker (2 wheels) Transfers: Sit to/from Stand Sit to Stand: Mod assist           General transfer comment: cues for hand placement for transitions     Ambulation/Gait Ambulation/Gait assistance: Contact guard assist, Min assist, +2 safety/equipment Gait Distance (Feet): 60 Feet Assistive device: Rolling walker (2 wheels) Gait Pattern/deviations: Step-through pattern, Decreased step length - right, Decreased step length - left Gait velocity: dec     General Gait Details: 60' x 2 with +2 assist for safety   Stairs             Wheelchair Mobility     Tilt Bed Tilt Bed Patient Response: Cooperative  Modified Rankin (Stroke Patients Only)       Balance Overall balance assessment: Needs assistance Sitting-balance support: Feet supported, Bilateral upper extremity supported Sitting balance-Leahy Scale: Fair     Standing balance support: Bilateral upper extremity supported, Reliant on assistive device for balance Standing balance-Leahy Scale: Poor Standing balance comment: generally unsteady. it does improve some with time and distance but still recommend +2 for safety                            Communication Communication Communication: Impaired Factors Affecting Communication: Hearing impaired  Cognition Arousal: Alert Behavior During Therapy: WFL for tasks assessed/performed   PT - Cognitive impairments: No apparent impairments                         Following commands: Intact Following commands impaired: Follows one step commands with increased time    Cueing Cueing Techniques: Verbal cues, Tactile cues  Exercises Other Exercises Other Exercises: to bathroom for small BM Other  Exercises: seated AROM and pre-gait activities in standing    General Comments        Pertinent Vitals/Pain Pain Assessment Pain Assessment: Faces Faces Pain Scale: Hurts even more Pain Location: stomach, B knees Pain Descriptors / Indicators: Aching, Grimacing, Guarding, Sore, Moaning Pain Intervention(s): Limited activity within patient's tolerance, Premedicated before session, Monitored during  session, Repositioned    Home Living                          Prior Function            PT Goals (current goals can now be found in the care plan section) Progress towards PT goals: Progressing toward goals    Frequency    Min 2X/week      PT Plan      Co-evaluation   Reason for Co-Treatment: Complexity of the patient's impairments (multi-system involvement) PT goals addressed during session: Mobility/safety with mobility OT goals addressed during session: Proper use of Adaptive equipment and DME;ADL's and self-care      AM-PAC PT "6 Clicks" Mobility   Outcome Measure  Help needed turning from your back to your side while in a flat bed without using bedrails?: A Lot Help needed moving from lying on your back to sitting on the side of a flat bed without using bedrails?: A Lot Help needed moving to and from a bed to a chair (including a wheelchair)?: A Lot Help needed standing up from a chair using your arms (e.g., wheelchair or bedside chair)?: A Lot Help needed to walk in hospital room?: A Lot Help needed climbing 3-5 steps with a railing? : Total 6 Click Score: 11    End of Session Equipment Utilized During Treatment: Gait belt Activity Tolerance: Patient limited by pain;Patient tolerated treatment well Patient left: in chair;with call bell/phone within reach;with family/visitor present Nurse Communication: Mobility status PT Visit Diagnosis: Other abnormalities of gait and mobility (R26.89);Difficulty in walking, not elsewhere classified (R26.2);Muscle weakness (generalized) (M62.81)     Time: 1125-1202 PT Time Calculation (min) (ACUTE ONLY): 37 min  Charges:    $Gait Training: 8-22 mins                     Danielle Dess, PTA 03/15/24, 12:43 PM

## 2024-03-15 NOTE — Progress Notes (Addendum)
 PROGRESS NOTE    Todd Rojas  ZOX:[id number] DOB: [birth year] DOA: 03/09/2024 PCP: Barbette Reichmann, MD  IC10A/IC10A-AA  LOS: 6 days   Brief hospital course:   Assessment & Plan: Todd Rojas is an 83 year old male with history of CKD stage IIIa, chronic gout, hypertension, hyperlipidemia, interstitial pulmonary disease, bronchiectasis, COPD, who presents emergency department for chief concerns of fever, chills, shortness of breath for 2 weeks.   Vitals in the ED showed temperature of 98.4, respiration rate 25, heart rate 94, blood pressure initially 96/69 and improved to 107/66, SpO2 initially desatted to 88% on room air   Gaseous abdominal distention 2/2 Colonic ileus  Ogilvie's syndrome Patient developed abdominal distention and crampy abdominal pain after presentation. Starting 3/25, Imaging with right colonic gaseous distention without any obvious obstruction.  Colonic distention has been worsening. --GenSurg and GI consult today, dx with Ogilvie's syndrome --repeat CT a/p today --transfer to stepdown in preparation for IV neostigmine injection under continuous cardiac monitoring with ICU staff on hand for any possible hemodynamic changes.  --[name] updated and will be present for the neostigmine administration tomorrow morning.  * Severe sepsis with acute organ dysfunction (HCC) 2/2 PNA Elevated lactic acid of 2.6, increased respiration rate, with fever of 102 F at home, organ involvement are cardiac and pulmonary with hypoxic respiratory failure at 88% on room air, elevated high sensitive troponin of 98 and elevated BNP --Imaging concerning for right lower lobe pneumonia, although not much upper respiratory symptoms.  Procalcitonin at 0.352. Respiratory panels were negative. UA does not consistent with UTI Patient received broad-spectrum antibiotics -Completed 5 days of cefepime and Zithromax-switching to Levaquin for 2 more days -Patient will need a repeat CT chest  in 3 to 4 weeks to see the resolution of current abnormalities   Acute hypoxic respiratory failure (HCC) Likely with pneumonia --Continue supplemental O2 to keep sats >=92%, wean as tolerated   Fever Afebrile now. Patient had 1 set of blood cultures done on 3/19 03/2024: Which was read as no growth, 5 days Preliminary repeat blood cultures negative in 4 days Concern of right lower lobe pneumonia. -Supportive care and antibiotics as listed above   Elevated troponin 2/2 demand ischemia First high sensitivity troponin was elevated at 98 >>101>175>226,  Suspect secondary to demand ischemia in setting of severe sepsis with acute hypoxic respiratory failure Echocardiogram with EF of 40% and regional wall motion abnormalities, discussed with cardiology and they think these abnormalities are chronic. Patient was also experiencing intermittent chest pain over the past couple of months which seems more exertional --cardio consulted, no plan for cardiac workup   CAD (coronary artery disease) With history of anterior STEMI in 2006, transferred to [facility] status post cardiac catheterization with PCI to mid LAD using Cypher stent; currently no longer on Plavix Complete echo on 05/17/2022: Estimated ejection fraction is 55%, trivial mitral regurgitation, trivial tricuspid regurgitation, trivial pulmonary valve regurgitation; aortic valve has normal variant --cont home ASA and statin   COPD (chronic obstructive pulmonary disease) (HCC) --cont home bronchodilator   Elevated serum creatinine Does not meet acute kidney injury, suspect prerenal in setting of nausea vomiting, creatinine with some improvement to 1.36 today Serum creatinine baseline is 1.35-1.36/eGFR 52-55, consistent with CKD stage III A -Monitor renal function -Avoid nephrotoxins   Hypertension, not currently active Blood pressure borderline soft Home furosemide and lisinopril 20 mg daily were not resumed on admission in setting of  increase in sCr level from baseline -Continue holding home antihypertensives  Hyperlipidemia, unspecified --cont home statin   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 Imaging was obtained which shows very mild right knee effusion and no effusion on left. --Voltaren gel and heat pad   Thyroid nodule greater than or equal to 1.5 cm in diameter incidentally noted on imaging study Incidental finding of a 2.7 cm thyroid nodule. -Thyroid ultrasound with solitary nodule which required outpatient biopsy -Check thyroid panel with TSH-pending results   Personal history of gout --cont home allopurinol  Rash Developed this morning, all over chest, back and bilateral thighs.  Appeared to be allergic rxn. --start prednisone 40 mg daily   DVT prophylaxis: Lovenox SQ Code Status: DNR  Family Communication: wife and daughter updated at bedside today Level of care: Stepdown Dispo:   The patient is from: home Anticipated d/c is to: SNF rehab Anticipated d/c date is: to be determined   Subjective and Interval History:  Continued to have gaseous distention of the colon.  GenSurg and GI consulted, dx with Ogilvie's syndrome.  Pt also developed rash in his trunk and bilateral thighs this morning.     Objective: Vitals:   03/15/24 0333 03/15/24 0849 03/15/24 1242 03/15/24 1910  BP: (!) 116/57 (!) 136/55 (!) 106/50   Pulse: (!) 101 96 82 74  Resp: 16 16 16 16   Temp: 98.2 F (36.8 C) 97.9 F (36.6 C) 97.6 F (36.4 C) 98.2 F (36.8 C)  TempSrc:    Axillary  SpO2: 95% 95% 98% 96%  Weight:      Height:    5\' 9"  (1.753 m)    Intake/Output Summary (Last 24 hours) at 03/15/2024 1911 Last data filed at 03/15/2024 1427 Gross per 24 hour  Intake 0 ml  Output --  Net 0 ml   Filed Weights   03/09/24 1127  Weight: 97.5 kg    Examination:   Constitutional: NAD HEENT: conjunctivae and lids normal, EOMI CV: No cyanosis.   RESP: normal respiratory  effort Abdomen: abdomen large and distended Neuro: II - XII grossly intact.     Data Reviewed: I have personally reviewed labs and imaging studies  Time spent: 70 minutes  Darlin Priestly, MD [facility] If 7PM-7AM, please contact night-coverage 03/15/2024, 7:11 PM

## 2024-03-15 NOTE — TOC Progression Note (Signed)
 Transition of Care [facility]) - Progression Note    Patient Details  Name: Todd Rojas MRN: [id number] Date of Birth: 05/14/1941  Transition of Care [facility]) CM/SW Contact  Margarito Liner, LCSW Phone Number: 03/15/2024, 1:04 PM  Clinical Narrative:  CSW met with patient, wife, and daughter. Plan is not officially set yet but they are still leaning towards taking him home. Wife stated she did not receive a call from the [employer] liaison so CSW left her a message asking her to call her at some point today.   Expected Discharge Plan: Skilled Nursing Facility Barriers to Discharge: Continued Medical Work up  Expected Discharge Plan and Services     Post Acute Care Choice:  (TBD) Living arrangements for the past 2 months: Single Family Home                                       Social Determinants of Health (SDOH) Interventions SDOH Screenings   Food Insecurity: No Food Insecurity (03/10/2024)  Housing: Low Risk  (03/10/2024)  Transportation Needs: No Transportation Needs (03/10/2024)  Utilities: Not At Risk (03/10/2024)  Depression (PHQ2-9): Low Risk  (07/12/2021)  Financial Resource Strain: Low Risk  (10/23/2023)   Received from [facility]  Social Connections: Socially Integrated (03/10/2024)  Tobacco Use: Medium Risk (03/09/2024)    Readmission Risk Interventions     No data to display

## 2024-03-15 NOTE — Progress Notes (Addendum)
 [facility] CLINIC CARDIOLOGY PROGRESS NOTE   Patient ID: Todd Rojas MRN: [id number] DOB/AGE: 08-01-1941 83 y.o.  Admit date: 03/09/2024 Referring Physician Dr. Nelson Chimes Primary Physician Barbette Reichmann, MD  Primary Cardiologist Ander Slade, [place] ([facility]) Reason for Consultation elevated troponin  HPI: Todd Rojas is a 83 y.o. male with a past medical history of CAD (2006 Anterior STEMI-LAD PCI, 2010 LAD PCI, 6/202 LCX, LAD and OM1 PCI), chronic HFrEF, HTN and dyslipidemia  who presented to the ED on 03/09/2024 for fever and SOB. Troponins checked and found to be mildly elevated, cardiology was consulted for further evaluation.   Interval History:  -Patient seen and examined this AM, resting comfortably in hospital bed with wife present. -Had an ok night, had some SOB.  -Cr slightly up with IV lasix yesterday. Wife reports mild increase in UOP and LE edema remains. -BP and heart rate remain controlled.  Review of systems complete and found to be negative unless listed above    Vitals:   03/14/24 2100 03/14/24 2314 03/15/24 0333 03/15/24 0849  BP:  135/76 (!) 116/57 (!) 136/55  Pulse:  97 (!) 101 96  Resp:  20 16 16   Temp:  98.8 F (37.1 C) 98.2 F (36.8 C) 97.9 F (36.6 C)  TempSrc:      SpO2: 97% 92% 95% 95%  Weight:      Height:         Intake/Output Summary (Last 24 hours) at 03/15/2024 0931 Last data filed at 03/14/2024 1900 Gross per 24 hour  Intake 120 ml  Output --  Net 120 ml      PHYSICAL EXAM General: Well-appearing elderly male, well nourished, in no acute distress. HEENT: Normocephalic and atraumatic. Neck: No JVD.  Lungs: Normal respiratory effort on 1L [place]. Clear bilaterally to auscultation. No wheezes, crackles, rhonchi.  Heart: HRRR. Normal S1 and S2 without gallops or murmurs. Radial & DP pulses 2+ bilaterally. Abdomen: Distended appearing.  Msk: Normal strength and tone for age. Extremities: No clubbing, cyanosis.  Pedal  edema. Neuro: Alert and oriented X 3. Psych: Mood appropriate, affect congruent.    LABS: Basic Metabolic Panel: Recent Labs    03/14/24 0340 03/15/24 0443  NA 132* 134*  K 3.9 3.8  CL 98 98  CO2 24 27  GLUCOSE 110* 108*  BUN 25* 30*  CREATININE 1.29* 1.50*  CALCIUM 8.1* 8.2*   Liver Function Tests: No results for input(s): "AST", "ALT", "ALKPHOS", "BILITOT", "PROT", "ALBUMIN" in the last 72 hours.  No results for input(s): "LIPASE", "AMYLASE" in the last 72 hours. CBC: Recent Labs    03/13/24 0412 03/14/24 0340  WBC 8.8 9.6  HGB 10.4* 10.8*  HCT 31.6* 32.8*  MCV 99.7 98.8  PLT 220 251   Cardiac Enzymes: No results for input(s): "CKTOTAL", "CKMB", "CKMBINDEX", "TROPONINIHS" in the last 72 hours.  BNP: No results for input(s): "BNP" in the last 72 hours.  D-Dimer: No results for input(s): "DDIMER" in the last 72 hours. Hemoglobin A1C: No results for input(s): "HGBA1C" in the last 72 hours. Fasting Lipid Panel: No results for input(s): "CHOL", "HDL", "LDLCALC", "TRIG", "CHOLHDL", "LDLDIRECT" in the last 72 hours. Thyroid Function Tests: No results for input(s): "TSH", "T4TOTAL", "T3FREE", "THYROIDAB" in the last 72 hours.  Invalid input(s): "FREET3"  Anemia Panel: No results for input(s): "VITAMINB12", "FOLATE", "FERRITIN", "TIBC", "IRON", "RETICCTPCT" in the last 72 hours.  No results found.    ECHO as above  TELEMETRY reviewed by me 03/15/24: not on tele  EKG reviewed by me 03/15/24: sinus tachycardia 1st degree AVB rate 110 bpm  DATA reviewed by me 03/15/24: last 24h vitals tele labs imaging I/O, hospitalist progress note  Principal Problem:   Severe sepsis with acute organ dysfunction (HCC) Active Problems:   CAD (coronary artery disease)   COPD (chronic obstructive pulmonary disease) (HCC)   Dyslipidemia   Hyperlipidemia, unspecified   Hypertension   Personal history of gout   Primary osteoarthritis of both knees   Elevated serum  creatinine   Elevated troponin   Fever   Thyroid nodule greater than or equal to 1.5 cm in diameter incidentally noted on imaging study   Acute hypoxic respiratory failure (HCC)   Gaseous abdominal distention    ASSESSMENT AND PLAN: [name] is a 83 y.o. male with a past medical history of CAD (2006 Anterior STEMI-LAD PCI, 2010 LAD PCI, 6/202 LCX, LAD and OM1 PCI), chronic HFrEF, HTN and dyslipidemia  who presented to the ED on 03/09/2024 for fever and SOB. Troponins checked and found to be mildly elevated, cardiology was consulted for further evaluation.   # Demand ischemia # Coronary artery disease # Severe sepsis # ?Ileus Patient with hx of CAD presented to the ED with SOB and fevers, found to be septic and started on IV antibiotics. Troponins checked and found to be elevated, reports pleuritic CP.  Echo this admission with EF 40%, regional wall motion abnormalities similar to prior echoes. -Given other current issues and no complaints of chest pain will defer any further cardiac diagnostic workup at this time. -Start metoprolol succinate 25 mg daily. Home BP meds held given borderline BP and elevated Cr.   -Suspect LE edema 2/2 dependency. Would defer further diuretic dosing. -Continue rosuvastatin 20 mg daily and aspirin 81 mg daily.  -Further management per primary team.   Cardiology will sign off. Please haiku with questions or re-engage if needed.    This patient's case was discussed and created with Dr. Corky Sing and he is in agreement.  Signed:  Gale Journey, PA-C  03/15/2024, 9:31 AM [facility] Cardiology

## 2024-03-16 ENCOUNTER — Inpatient Hospital Stay

## 2024-03-16 DIAGNOSIS — K5981 Ogilvie syndrome: Secondary | ICD-10-CM | POA: Diagnosis not present

## 2024-03-16 DIAGNOSIS — A419 Sepsis, unspecified organism: Secondary | ICD-10-CM | POA: Diagnosis not present

## 2024-03-16 DIAGNOSIS — R6520 Severe sepsis without septic shock: Secondary | ICD-10-CM | POA: Diagnosis not present

## 2024-03-16 LAB — COMPREHENSIVE METABOLIC PANEL WITH GFR
ALT: 20 U/L (ref 0–44)
AST: 21 U/L (ref 15–41)
Albumin: 2.1 g/dL — ABNORMAL LOW (ref 3.5–5.0)
Alkaline Phosphatase: 38 U/L (ref 38–126)
Anion gap: 8 (ref 5–15)
BUN: 34 mg/dL — ABNORMAL HIGH (ref 8–23)
CO2: 26 mmol/L (ref 22–32)
Calcium: 8.1 mg/dL — ABNORMAL LOW (ref 8.9–10.3)
Chloride: 98 mmol/L (ref 98–111)
Creatinine, Ser: 1.25 mg/dL — ABNORMAL HIGH (ref 0.61–1.24)
GFR, Estimated: 57 mL/min — ABNORMAL LOW (ref >60)
Glucose, Bld: 147 mg/dL — ABNORMAL HIGH (ref 70–99)
Potassium: 4.6 mmol/L (ref 3.5–5.1)
Sodium: 132 mmol/L — ABNORMAL LOW (ref 135–145)
Total Bilirubin: 0.6 mg/dL (ref 0.0–1.2)
Total Protein: 6.5 g/dL (ref 6.5–8.1)

## 2024-03-16 LAB — CBC
HCT: 34.8 % — ABNORMAL LOW (ref 39.0–52.0)
Hemoglobin: 11.5 g/dL — ABNORMAL LOW (ref 13.0–17.0)
MCH: 32.1 pg (ref 26.0–34.0)
MCHC: 33 g/dL (ref 30.0–36.0)
MCV: 97.2 fL (ref 80.0–100.0)
Platelets: 293 10*3/uL (ref 150–400)
RBC: 3.58 MIL/uL — ABNORMAL LOW (ref 4.22–5.81)
RDW: 13.3 % (ref 11.5–15.5)
WBC: 9 10*3/uL (ref 4.0–10.5)
nRBC: 0 % (ref 0.0–0.2)

## 2024-03-16 LAB — MAGNESIUM: Magnesium: 2.5 mg/dL — ABNORMAL HIGH (ref 1.7–2.4)

## 2024-03-16 MED ORDER — SODIUM CHLORIDE 0.9 % IV SOLN: INTRAVENOUS | Status: DC

## 2024-03-16 MED ORDER — DOPAMINE-DEXTROSE 3.2-5 MG/ML-% IV SOLN
INTRAVENOUS | Status: AC
  Filled 2024-03-16: qty 250

## 2024-03-16 MED ORDER — ATROPINE SULFATE 1 MG/10ML IJ SOSY
PREFILLED_SYRINGE | INTRAMUSCULAR | Status: AC
  Filled 2024-03-16: qty 10

## 2024-03-16 MED ORDER — SODIUM CHLORIDE 0.9 % IV SOLN
2.0000 mg | Freq: Once | INTRAVENOUS | Status: AC
  Administered 2024-03-16: 2 mg via INTRAVENOUS
  Filled 2024-03-16: qty 2

## 2024-03-16 NOTE — Plan of Care (Signed)
 The patient remains on AR-SDU at time of writing. The patient is more drowsy this evening over last evening. The patient is now requiring 4 L/min of supplemental O2 via [place]. FMS and external urine containment device remain in place. The patient continues to receive MIVF via PIV.   Problem: Fluid Volume: Goal: Hemodynamic stability will improve Outcome: Not Progressing   Problem: Clinical Measurements: Goal: Diagnostic test results will improve Outcome: Not Progressing Goal: Signs and symptoms of infection will decrease Outcome: Not Progressing   Problem: Respiratory: Goal: Ability to maintain adequate ventilation will improve Outcome: Not Progressing   Problem: Education: Goal: Knowledge of General Education information will improve Description: Including pain rating scale, medication(s)/side effects and non-pharmacologic comfort measures Outcome: Not Progressing   Problem: Health Behavior/Discharge Planning: Goal: Ability to manage health-related needs will improve Outcome: Not Progressing   Problem: Clinical Measurements: Goal: Ability to maintain clinical measurements within normal limits will improve Outcome: Not Progressing Goal: Will remain free from infection Outcome: Not Progressing Goal: Diagnostic test results will improve Outcome: Not Progressing Goal: Respiratory complications will improve Outcome: Not Progressing Goal: Cardiovascular complication will be avoided Outcome: Not Progressing   Problem: Activity: Goal: Risk for activity intolerance will decrease Outcome: Not Progressing   Problem: Nutrition: Goal: Adequate nutrition will be maintained Outcome: Not Progressing   Problem: Coping: Goal: Level of anxiety will decrease Outcome: Not Progressing   Problem: Elimination: Goal: Will not experience complications related to bowel motility Outcome: Not Progressing Goal: Will not experience complications related to urinary retention Outcome: Not  Progressing   Problem: Pain Managment: Goal: General experience of comfort will improve and/or be controlled Outcome: Not Progressing   Problem: Safety: Goal: Ability to remain free from injury will improve Outcome: Not Progressing   Problem: Skin Integrity: Goal: Risk for impaired skin integrity will decrease Outcome: Not Progressing

## 2024-03-16 NOTE — Progress Notes (Signed)
 GI progress note  S: Patient sleeping well. Had just undergone iV neostigmine infusion this morning for Colonic pseudo obstruction. Appeared successful at eliciting marked flatus and improvement of abdominal distention. Some sustained bradycardia occurred, but was responsive to IV atropine.  O: vitals stable currently HEENT: [place]/AT. PERRLA.EOMI  Neck:  supple no nodes Chest: Few coarse rhonchi, no wheezes. CV: RR no gallop Abd : Distended but improved from earlier today. BS+. Ext: Trace edema   A:  1. Ogilvie's syndrome- Clinically improving with IV neostigmine x 1 with some bradycardia episode responsive to atropine.  2.  CAD 3. Elevated Troponin c/w demand ischemia. Cardiology following   P:  1. Recheck KUB 2. Following along with you.    George Ina, M.D. [facility] Gastroenterology

## 2024-03-16 NOTE — Progress Notes (Signed)
 PROGRESS NOTE    Todd Rojas  NFA:[id number] DOB: Apr 10, 1941 DOA: 03/09/2024 PCP: Barbette Reichmann, MD  IC10A/IC10A-AA  LOS: 7 days   Brief hospital course:   Assessment & Plan: Todd Rojas is an 83 year old male with history of CKD stage IIIa, chronic gout, hypertension, hyperlipidemia, interstitial pulmonary disease, bronchiectasis, COPD, who presents emergency department for chief concerns of fever, chills, shortness of breath for 2 weeks.   Vitals in the ED showed temperature of 98.4, respiration rate 25, heart rate 94, blood pressure initially 96/69 and improved to 107/66, SpO2 initially desatted to 88% on room air   Gaseous abdominal distention 2/2 Colonic ileus  Ogilvie's syndrome Patient developed abdominal distention and crampy abdominal pain after presentation. Starting 3/25, Imaging with right colonic gaseous distention without any obvious obstruction.  Colonic distention has been worsening. --GenSurg and GI consulted, dx with Ogilvie's syndrome --neostigmine 2 mg push this morning with [name] and [name] present --monitor for resolution of colonic distention  Bradycardia  2/2 neostigmine administration.  S/p atropine. --monitor in stepdown today  * Severe sepsis with acute organ dysfunction (HCC) 2/2 PNA Elevated lactic acid of 2.6, increased respiration rate, with fever of 102 F at home, organ involvement are cardiac and pulmonary with hypoxic respiratory failure at 88% on room air, elevated high sensitive troponin of 98 and elevated BNP --Imaging concerning for right lower lobe pneumonia, although not much upper respiratory symptoms.  Procalcitonin at 0.352. Respiratory panels were negative. UA does not consistent with UTI Patient received broad-spectrum antibiotics -Completed 5 days of cefepime and Zithromax-switching to Levaquin for 2 more days -Patient will need a repeat CT chest in 3 to 4 weeks to see the resolution of current abnormalities    Acute hypoxic respiratory failure (HCC) Likely with pneumonia --Continue supplemental O2 to keep sats >=92%, wean as tolerated  Fever Afebrile now. Patient had 1 set of blood cultures done on 3/19 03/2024: Which was read as no growth, 5 days Preliminary repeat blood cultures negative in 4 days Concern of right lower lobe pneumonia. -Supportive care and antibiotics as listed above   Elevated troponin 2/2 demand ischemia First high sensitivity troponin was elevated at 98 >>101>175>226,  Suspect secondary to demand ischemia in setting of severe sepsis with acute hypoxic respiratory failure Echocardiogram with EF of 40% and regional wall motion abnormalities, discussed with cardiology and they think these abnormalities are chronic. Patient was also experiencing intermittent chest pain over the past couple of months which seems more exertional --cardio consulted, no plan for cardiac workup   CAD (coronary artery disease) With history of anterior STEMI in 2006, transferred to [facility] status post cardiac catheterization with PCI to mid LAD using Cypher stent; currently no longer on Plavix Complete echo on 05/17/2022: Estimated ejection fraction is 55%, trivial mitral regurgitation, trivial tricuspid regurgitation, trivial pulmonary valve regurgitation; aortic valve has normal variant --cont home ASA and statin   COPD (chronic obstructive pulmonary disease) (HCC) --cont home bronchodilator   Elevated serum creatinine Does not meet acute kidney injury, suspect prerenal in setting of nausea vomiting, creatinine with some improvement to 1.36 today Serum creatinine baseline is 1.35-1.36/eGFR 52-55, consistent with CKD stage III A -Monitor renal function -Avoid nephrotoxins   Hypertension, not currently active Blood pressure borderline soft Home furosemide and lisinopril 20 mg daily were not resumed on admission in setting of increase in sCr level from baseline -Continue holding home  antihypertensives   Hyperlipidemia, unspecified --cont home statin   Primary osteoarthritis of both  knees S/p bilateral knee arthroplasty. Imaging was obtained which shows very mild right knee effusion and no effusion on left. --Voltaren gel and heat pad   Thyroid nodule greater than or equal to 1.5 cm in diameter incidentally noted on imaging study Incidental finding of a 2.7 cm thyroid nodule. -Thyroid ultrasound with solitary nodule which required outpatient biopsy -Check thyroid panel with TSH-pending results   Personal history of gout --cont home allopurinol  Rash Developed this morning, all over chest, back and bilateral thighs.  Appeared to be allergic rxn. --cont prednisone 40 mg daily   DVT prophylaxis: Lovenox SQ Code Status: Full code code status changed for the neostigmine procedure  Family Communication: Level of care: Stepdown Dispo:   The patient is from: home Anticipated d/c is to: SNF rehab Anticipated d/c date is: to be determined   Subjective and Interval History:  Pt received neostigmine this morning.  HR dropped to 20's but responded to atropine.    Pt reported passed gas and had large BM afterwards.   Objective: Vitals:   03/16/24 1200 03/16/24 1300 03/16/24 1400 03/16/24 1500  BP: 136/81 (!) 155/82 132/79 (!) 144/75  Pulse: 83 82 84 84  Resp: 18 15 18 16   Temp:   97.9 F (36.6 C)   TempSrc:   Axillary   SpO2: 94% 91% 95% 94%  Weight:      Height:        Intake/Output Summary (Last 24 hours) at 03/16/2024 1646 Last data filed at 03/16/2024 1444 Gross per 24 hour  Intake 1176.31 ml  Output 1255 ml  Net -78.69 ml   Filed Weights   03/09/24 1127 03/15/24 1910  Weight: 97.5 kg 97.2 kg    Examination:   Constitutional: NAD, somnolent, but arousable CV: No cyanosis.   RESP: normal respiratory effort, on 4L Abdomen: less distended and softer Extremities: edema in BLE SKIN: warm, dry Rectal tube present   Data Reviewed: I have  personally reviewed labs and imaging studies  Time spent: 50 minutes  Darlin Priestly, MD [facility] If 7PM-7AM, please contact night-coverage 03/16/2024, 4:46 PM

## 2024-03-16 NOTE — Progress Notes (Signed)
 Pt's HR continued to drop to the 40s a couple times after neostigmine administration. Pt is also more difficult to arouse. Dr. Fran Lowes notified. Orders for pt remain a full code for now.

## 2024-03-16 NOTE — Progress Notes (Signed)
 CC: Ogilvie's Subjective: Pt examined after Neostigmine was given, brady down to 20s HR received atropine. Had a large BM w gas, feels better KUB pers review before neostigmine w cecum 14 cm no free air  Objective: Vital signs in last 24 hours: Temp:  [97.4 F (36.3 C)-98.2 F (36.8 C)] 97.5 F (36.4 C) (03/29 0800) Pulse Rate:  [72-91] 79 (03/29 0900) Resp:  [13-29] 15 (03/29 0900) BP: (106-165)/(50-95) 163/81 (03/29 0900) SpO2:  [86 %-98 %] 92 % (03/29 0900) Weight:  [97.2 kg] 97.2 kg (03/28 1910) Last BM Date : 03/16/24  Intake/Output from previous day: 03/28 0701 - 03/29 0700 In: 855.8 [P.O.:60; I.V.:795.8] Out: 805 [Urine:770; Stool:35] Intake/Output this shift: Total I/O In: 103.3 [I.V.:103.3] Out: 0   Physical exam:  NAd alert Abd: soft, improved after neostigmine, no peritonitis  Lab Results: CBC  Recent Labs    03/14/24 0340 03/16/24 0547  WBC 9.6 9.0  HGB 10.8* 11.5*  HCT 32.8* 34.8*  PLT 251 293   BMET Recent Labs    03/15/24 0443 03/16/24 0547  NA 134* 132*  K 3.8 4.6  CL 98 98  CO2 27 26  GLUCOSE 108* 147*  BUN 30* 34*  CREATININE 1.50* 1.25*  CALCIUM 8.2* 8.1*   PT/INR No results for input(s): "LABPROT", "INR" in the last 72 hours. ABG No results for input(s): "PHART", "HCO3" in the last 72 hours.  Invalid input(s): "PCO2", "PO2"  Studies/Results: CT ABDOMEN PELVIS W CONTRAST Result Date: 03/15/2024 CLINICAL DATA:  Ogilvie syndrome.  Abdominal distension. EXAM: CT ABDOMEN AND PELVIS WITH CONTRAST TECHNIQUE: Multidetector CT imaging of the abdomen and pelvis was performed using the standard protocol following bolus administration of intravenous contrast. RADIATION DOSE REDUCTION: This exam was performed according to the departmental dose-optimization program which includes automated exposure control, adjustment of the mA and/or kV according to patient size and/or use of iterative reconstruction technique. CONTRAST:  OMNIPAQUE  IOHEXOL 300 MG/ML  SOLN COMPARISON:  03/12/2024.  Plain films earlier today. FINDINGS: Lower chest: Atelectasis in the lower lobes bilaterally, stable since prior study. No effusions. Coronary artery and aortic atherosclerosis. Hepatobiliary: No focal hepatic abnormality. Gallbladder unremarkable. Pancreas: No focal abnormality or ductal dilatation. Spleen: No focal abnormality.  Normal size. Adrenals/Urinary Tract: Adrenal glands normal. Areas of scarring in the left kidney. No suspicious renal abnormality, stones or hydronephrosis. Urinary bladder unremarkable. Stomach/Bowel: Gaseous distension of the right colon and transverse colon and descending colon. Rectosigmoid colon are decompressed. Findings are stable when compared to prior CT and plain films. Stomach and small bowel decompressed, unremarkable. Vascular/Lymphatic: Aortic atherosclerosis. No evidence of aneurysm or adenopathy. Reproductive: Prostate enlargement Other: No free fluid or free air. Musculoskeletal: No acute bony abnormality. IMPRESSION: Gaseous distention of the colon to the level of the sigmoid colon where there is gradual decompression. Findings most compatible with ileus and are unchanged since prior CT and plain films. Coronary artery disease, aortic atherosclerosis. Prostate enlargement. Stable bibasilar atelectasis. Electronically Signed   By: Charlett Nose M.D.   On: 03/15/2024 17:09   DG Abd 1 View Result Date: 03/15/2024 CLINICAL DATA:  16109 Abdominal distention [id number] Abdominal pain [id number] EXAM: ABDOMEN - 1 VIEW COMPARISON:  03/13/2024 FINDINGS: Diffuse gaseous distension of the colon, increased compared to the prior film. No dilated loops of small bowel are seen. Enteric contrast has progressed to the level of the rectum. No gross free intraperitoneal air on supine imaging. IMPRESSION: Diffuse gaseous distension of the colon, increased compared to the prior  film. No dilated loops of small bowel are seen. Electronically  Signed   By: Duanne Guess D.O.   On: 03/15/2024 13:07    Anti-infectives: Anti-infectives (From admission, onward)    Start     Dose/Rate Route Frequency Ordered Stop   03/13/24 1200  levofloxacin (LEVAQUIN) tablet 750 mg        750 mg Oral Daily 03/13/24 1148 03/15/24 1413   03/10/24 1600  vancomycin (VANCOREADY) IVPB 1250 mg/250 mL  Status:  Discontinued        1,250 mg 166.7 mL/hr over 90 Minutes Intravenous Every 24 hours 03/10/24 1056 03/10/24 1250   03/10/24 0945  azithromycin (ZITHROMAX) 500 mg in sodium chloride 0.9 % 250 mL IVPB        500 mg 250 mL/hr over 60 Minutes Intravenous Every 24 hours 03/10/24 0934 03/12/24 1024   03/10/24 0200  metroNIDAZOLE (FLAGYL) IVPB 500 mg  Status:  Discontinued        500 mg 100 mL/hr over 60 Minutes Intravenous Every 12 hours 03/09/24 1515 03/10/24 0933   03/10/24 0000  ceFEPIme (MAXIPIME) 2 g in sodium chloride 0.9 % 100 mL IVPB  Status:  Discontinued        2 g 200 mL/hr over 30 Minutes Intravenous Every 12 hours 03/09/24 1541 03/13/24 1148   03/09/24 1600  vancomycin (VANCOCIN) IVPB 1000 mg/200 mL premix        1,000 mg 200 mL/hr over 60 Minutes Intravenous  Once 03/09/24 1537 03/09/24 1834   03/09/24 1537  vancomycin variable dose per unstable renal function (pharmacist dosing)  Status:  Discontinued         Does not apply See admin instructions 03/09/24 1537 03/10/24 1056   03/09/24 1515  metroNIDAZOLE (FLAGYL) IVPB 500 mg  Status:  Discontinued        500 mg 100 mL/hr over 60 Minutes Intravenous Every 12 hours 03/09/24 1504 03/09/24 1515   03/09/24 1200  ceFEPIme (MAXIPIME) 2 g in sodium chloride 0.9 % 100 mL IVPB        2 g 200 mL/hr over 30 Minutes Intravenous  Once 03/09/24 1152 03/09/24 1304   03/09/24 1200  metroNIDAZOLE (FLAGYL) IVPB 500 mg        500 mg 100 mL/hr over 60 Minutes Intravenous  Once 03/09/24 1152 03/09/24 1505   03/09/24 1200  vancomycin (VANCOCIN) IVPB 1000 mg/200 mL premix        1,000 mg 200 mL/hr over  60 Minutes Intravenous  Once 03/09/24 1152 03/09/24 1523       Assessment/Plan:  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

## 2024-03-16 NOTE — Progress Notes (Signed)
 Patient ID: Todd Rojas, male   DOB: [birth year], 83 y.o.   MRN: [id number] [facility]    SUBJECTIVE: Resting comfortably in bed asleep does not appear to be in any significant distress mental status reasonably normal   Vitals:   03/16/24 1200 03/16/24 1300 03/16/24 1400 03/16/24 1500  BP: 136/81 (!) 155/82 132/79 (!) 144/75  Pulse: 83 82 84 84  Resp: 18 15 18 16   Temp:   97.9 F (36.6 C)   TempSrc:   Axillary   SpO2: 94% 91% 95% 94%  Weight:      Height:         Intake/Output Summary (Last 24 hours) at 03/16/2024 1617 Last data filed at 03/16/2024 1444 Gross per 24 hour  Intake 1176.31 ml  Output 1255 ml  Net -78.69 ml      PHYSICAL EXAM  General: Well developed, well nourished, in no acute distress HEENT:  Normocephalic and atramatic Neck:  No JVD.  Lungs: Clear bilaterally to auscultation and percussion. Heart: HRRR . Normal S1 and S2 without gallops or murmurs.  Abdomen: Bowel sounds are positive, abdomen soft and non-tender  Msk:  Back normal, normal gait. Normal strength and tone for age. Extremities: No clubbing, cyanosis or edema.   Neuro: Alert and oriented X 3. Psych:  Good affect, responds appropriately   LABS: Basic Metabolic Panel: Recent Labs    03/15/24 0443 03/16/24 0547  NA 134* 132*  K 3.8 4.6  CL 98 98  CO2 27 26  GLUCOSE 108* 147*  BUN 30* 34*  CREATININE 1.50* 1.25*  CALCIUM 8.2* 8.1*  MG  --  2.5*   Liver Function Tests: Recent Labs    03/16/24 0547  AST 21  ALT 20  ALKPHOS 38  BILITOT 0.6  PROT 6.5  ALBUMIN 2.1*   No results for input(s): "LIPASE", "AMYLASE" in the last 72 hours. CBC: Recent Labs    03/14/24 0340 03/16/24 0547  WBC 9.6 9.0  HGB 10.8* 11.5*  HCT 32.8* 34.8*  MCV 98.8 97.2  PLT 251 293   Cardiac Enzymes: No results for input(s): "CKTOTAL", "CKMB", "CKMBINDEX", "TROPONINI" in the last 72 hours. BNP: Invalid input(s): "POCBNP" D-Dimer: No results for input(s): "DDIMER" in the last 72  hours. Hemoglobin A1C: No results for input(s): "HGBA1C" in the last 72 hours. Fasting Lipid Panel: No results for input(s): "CHOL", "HDL", "LDLCALC", "TRIG", "CHOLHDL", "LDLDIRECT" in the last 72 hours. Thyroid Function Tests: No results for input(s): "TSH", "T4TOTAL", "T3FREE", "THYROIDAB" in the last 72 hours.  Invalid input(s): "FREET3" Anemia Panel: No results for input(s): "VITAMINB12", "FOLATE", "FERRITIN", "TIBC", "IRON", "RETICCTPCT" in the last 72 hours.  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  Abdominal distension. EXAM: DG ABDOMEN ACUTE WITH 1 VIEW CHEST COMPARISON:  03/15/2024 FINDINGS: Diffuse gaseous distension of the colon is again noted. Right lower quadrant bowel loop measures 14.5 cm in diameter, unchanged from previous exam. Rectal contrast material is identified as noted previously. No signs of free air. Stable cardiomediastinal contours. Low lung volumes. Persistent bibasilar pulmonary opacities, right greater than left. IMPRESSION: 1. Persistent diffuse gaseous distension of the colon. Similar to previous exam. 2. Persistent bibasilar pulmonary opacities, right greater than left. Electronically Signed   By: Signa Kell M.D.   On: 03/16/2024 10:53   CT ABDOMEN PELVIS W CONTRAST Result Date: 03/15/2024 CLINICAL DATA:  Ogilvie syndrome.  Abdominal distension. EXAM: CT ABDOMEN AND PELVIS WITH CONTRAST TECHNIQUE: Multidetector CT  imaging of the abdomen and pelvis was performed using the standard protocol following bolus administration of intravenous contrast. RADIATION DOSE REDUCTION: This exam was performed according to the departmental dose-optimization program which includes automated exposure control, adjustment of the mA and/or kV according to patient size and/or use of iterative reconstruction technique. CONTRAST:  OMNIPAQUE IOHEXOL 300 MG/ML  SOLN COMPARISON:  03/12/2024.  Plain films earlier today. FINDINGS: Lower  chest: Atelectasis in the lower lobes bilaterally, stable since prior study. No effusions. Coronary artery and aortic atherosclerosis. Hepatobiliary: No focal hepatic abnormality. Gallbladder unremarkable. Pancreas: No focal abnormality or ductal dilatation. Spleen: No focal abnormality.  Normal size. Adrenals/Urinary Tract: Adrenal glands normal. Areas of scarring in the left kidney. No suspicious renal abnormality, stones or hydronephrosis. Urinary bladder unremarkable. Stomach/Bowel: Gaseous distension of the right colon and transverse colon and descending colon. Rectosigmoid colon are decompressed. Findings are stable when compared to prior CT and plain films. Stomach and small bowel decompressed, unremarkable. Vascular/Lymphatic: Aortic atherosclerosis. No evidence of aneurysm or adenopathy. Reproductive: Prostate enlargement Other: No free fluid or free air. Musculoskeletal: No acute bony abnormality. IMPRESSION: Gaseous distention of the colon to the level of the sigmoid colon where there is gradual decompression. Findings most compatible with ileus and are unchanged since prior CT and plain films. Coronary artery disease, aortic atherosclerosis. Prostate enlargement. Stable bibasilar atelectasis. Electronically Signed   By: Charlett Nose M.D.   On: 03/15/2024 17:09   DG Abd 1 View Result Date: 03/15/2024 CLINICAL DATA:  16109 Abdominal distention [id number] Abdominal pain [id number] EXAM: ABDOMEN - 1 VIEW COMPARISON:  03/13/2024 FINDINGS: Diffuse gaseous distension of the colon, increased compared to the prior film. No dilated loops of small bowel are seen. Enteric contrast has progressed to the level of the rectum. No gross free intraperitoneal air on supine imaging. IMPRESSION: Diffuse gaseous distension of the colon, increased compared to the prior film. No dilated loops of small bowel are seen. Electronically Signed   By: Duanne Guess D.O.   On: 03/15/2024 13:07     Echo mild reduced left  ventricular function EF around 40%  TELEMETRY: Sinus rhythm rate of around 80 nonspecific ST-T wave changes:  ASSESSMENT AND PLAN:  Principal Problem:   Severe sepsis with acute organ dysfunction (HCC) Active Problems:   CAD (coronary artery disease)   COPD (chronic obstructive pulmonary disease) (HCC)   Dyslipidemia   Hyperlipidemia, unspecified   Hypertension   Personal history of gout   Primary osteoarthritis of both knees   Elevated serum creatinine   Elevated troponin   Fever   Thyroid nodule greater than or equal to 1.5 cm in diameter incidentally noted on imaging study   Acute hypoxic respiratory failure (HCC)   Gaseous abdominal distention   Ogilvie syndrome    Plan Severe sepsis with organ dysfunction continue current therapy Multivessel coronary artery disease continue conservative management Mildly reduced left ventricular function around 40% continue current management Hyperlipidemia continue Crestor therapy for lipid management Ileus generalized on neostigmine continue current therapy patient resting comfortably Agree with GI management   Alwyn Pea, MD, 03/16/2024 4:17 PM

## 2024-03-16 NOTE — Progress Notes (Signed)
 Neostigmine medication administered with [name] and [name] at bedside. Zoll pads on pt in case of bradycardia. Shortly after medication administered, pt's became bradycardic with HR of 27. Atropine administered and pt placed on zoll monitoring. Pt remained awake and answering questions. Pt had large BM and c/o abdominal pain. MD aware and will continue to monitor.

## 2024-03-17 ENCOUNTER — Inpatient Hospital Stay: Admitting: Anesthesiology

## 2024-03-17 ENCOUNTER — Inpatient Hospital Stay

## 2024-03-17 ENCOUNTER — Encounter
Admission: EM | Disposition: A | Discharge status: Home Health | Payer: Self-pay | Source: Home / Self Care | Attending: Internal Medicine

## 2024-03-17 DIAGNOSIS — R6520 Severe sepsis without septic shock: Secondary | ICD-10-CM | POA: Diagnosis not present

## 2024-03-17 DIAGNOSIS — K5981 Ogilvie syndrome: Secondary | ICD-10-CM | POA: Diagnosis not present

## 2024-03-17 DIAGNOSIS — A419 Sepsis, unspecified organism: Secondary | ICD-10-CM | POA: Diagnosis not present

## 2024-03-17 HISTORY — PX: FLEXIBLE SIGMOIDOSCOPY: SHX5431

## 2024-03-17 LAB — CBC
HCT: 33.3 % — ABNORMAL LOW (ref 39.0–52.0)
Hemoglobin: 11.1 g/dL — ABNORMAL LOW (ref 13.0–17.0)
MCH: 32.8 pg (ref 26.0–34.0)
MCHC: 33.3 g/dL (ref 30.0–36.0)
MCV: 98.5 fL (ref 80.0–100.0)
Platelets: 287 10*3/uL (ref 150–400)
RBC: 3.38 MIL/uL — ABNORMAL LOW (ref 4.22–5.81)
RDW: 13.4 % (ref 11.5–15.5)
WBC: 10.5 10*3/uL (ref 4.0–10.5)
nRBC: 0.2 % (ref 0.0–0.2)

## 2024-03-17 LAB — BASIC METABOLIC PANEL WITH GFR
Anion gap: 10 (ref 5–15)
BUN: 35 mg/dL — ABNORMAL HIGH (ref 8–23)
CO2: 25 mmol/L (ref 22–32)
Calcium: 8 mg/dL — ABNORMAL LOW (ref 8.9–10.3)
Chloride: 101 mmol/L (ref 98–111)
Creatinine, Ser: 1.09 mg/dL (ref 0.61–1.24)
GFR, Estimated: >60 mL/min (ref >60)
Glucose, Bld: 116 mg/dL — ABNORMAL HIGH (ref 70–99)
Potassium: 4.1 mmol/L (ref 3.5–5.1)
Sodium: 136 mmol/L (ref 135–145)

## 2024-03-17 LAB — MAGNESIUM: Magnesium: 2.5 mg/dL — ABNORMAL HIGH (ref 1.7–2.4)

## 2024-03-17 SURGERY — SIGMOIDOSCOPY, FLEXIBLE
Anesthesia: General

## 2024-03-17 MED ORDER — PROPOFOL 10 MG/ML IV BOLUS
INTRAVENOUS | Status: AC
  Filled 2024-03-17: qty 20

## 2024-03-17 MED ORDER — SODIUM CHLORIDE 0.9 % IV SOLN
INTRAVENOUS | Status: DC | PRN
Start: 2024-03-17 — End: 2024-03-17

## 2024-03-17 MED ORDER — SODIUM CHLORIDE 0.9% FLUSH
3.0000 mL | Freq: Two times a day (BID) | INTRAVENOUS | Status: DC
  Administered 2024-03-17 – 2024-03-19 (×6): 10 mL via INTRAVENOUS

## 2024-03-17 MED ORDER — LIDOCAINE HCL (PF) 2 % IJ SOLN
INTRAMUSCULAR | Status: AC
  Filled 2024-03-17: qty 5

## 2024-03-17 MED ORDER — PROPOFOL 10 MG/ML IV BOLUS
INTRAVENOUS | Status: DC | PRN
  Administered 2024-03-17: 60 mg via INTRAVENOUS

## 2024-03-17 MED ORDER — POLYVINYL ALCOHOL 1.4 % OP SOLN: 1.0000 [drp] | OPHTHALMIC | Status: DC | PRN

## 2024-03-17 MED ORDER — KETOROLAC TROMETHAMINE 15 MG/ML IJ SOLN
15.0000 mg | Freq: Four times a day (QID) | INTRAMUSCULAR | Status: DC
  Administered 2024-03-17 – 2024-03-21 (×15): 15 mg via INTRAVENOUS
  Filled 2024-03-17 (×16): qty 1

## 2024-03-17 MED ORDER — SODIUM CHLORIDE 0.9% FLUSH: 3.0000 mL | INTRAVENOUS | Status: DC | PRN

## 2024-03-17 MED ORDER — PHENYLEPHRINE 80 MCG/ML (10ML) SYRINGE FOR IV PUSH (FOR BLOOD PRESSURE SUPPORT)
PREFILLED_SYRINGE | INTRAVENOUS | Status: DC | PRN
  Administered 2024-03-17: 160 ug via INTRAVENOUS

## 2024-03-17 MED ORDER — PROPOFOL 500 MG/50ML IV EMUL
INTRAVENOUS | Status: DC | PRN
  Administered 2024-03-17: 150 ug/kg/min via INTRAVENOUS

## 2024-03-17 MED ORDER — LIDOCAINE HCL (CARDIAC) PF 100 MG/5ML IV SOSY
PREFILLED_SYRINGE | INTRAVENOUS | Status: DC | PRN
  Administered 2024-03-17: 40 mg via INTRAVENOUS

## 2024-03-17 MED ORDER — ENOXAPARIN SODIUM 60 MG/0.6ML IJ SOSY
45.0000 mg | PREFILLED_SYRINGE | INTRAMUSCULAR | Status: DC
  Administered 2024-03-17 – 2024-03-25 (×9): 45 mg via SUBCUTANEOUS
  Filled 2024-03-17 (×10): qty 0.6

## 2024-03-17 NOTE — TOC Progression Note (Signed)
 Transition of Care [facility]) - Progression Note    Patient Details  Name: Todd Rojas MRN: [id number] Date of Birth: 03/05/1941  Transition of Care [facility]) CM/SW Contact  Susa Simmonds, [place] Phone Number: 03/17/2024, 2:48 PM  Clinical Narrative:  CSW spoke with patients wife who stated they would like SNF when patient is ready for discharge. The wife would like [facility].     Expected Discharge Plan: Skilled Nursing Facility Barriers to Discharge: Continued Medical Work up  Expected Discharge Plan and Services     Post Acute Care Choice:  (TBD) Living arrangements for the past 2 months: Single Family Home                                       Social Determinants of Health (SDOH) Interventions SDOH Screenings   Food Insecurity: No Food Insecurity (03/10/2024)  Housing: Low Risk  (03/10/2024)  Transportation Needs: No Transportation Needs (03/10/2024)  Utilities: Not At Risk (03/10/2024)  Depression (PHQ2-9): Low Risk  (07/12/2021)  Financial Resource Strain: Low Risk  (10/23/2023)   Received from [facility]  Social Connections: Socially Integrated (03/10/2024)  Tobacco Use: Medium Risk (03/09/2024)    Readmission Risk Interventions     No data to display

## 2024-03-17 NOTE — Anesthesia Procedure Notes (Signed)
 Date/Time: 03/17/2024 12:07 PM  Performed by: Stormy Fabian, CRNAPre-anesthesia Checklist: Patient identified, Emergency Drugs available, Suction available and Patient being monitored Patient Re-evaluated:Patient Re-evaluated prior to induction Oxygen Delivery Method: Nasal cannula Induction Type: IV induction Dental Injury: Teeth and Oropharynx as per pre-operative assessment  Comments: Nasal cannula with etCO2 monitoring

## 2024-03-17 NOTE — Op Note (Signed)
 [facility] Patient Name: Todd Rojas Procedure Date: 03/17/2024 11:21 AM MRN: [id number] Account #: [id number] Date of Birth: May 20, 1941 Admit Type: Inpatient Age: 83 Room: [facility] ENDO ROOM 4 Gender: Male Note Status: Finalized Instrument Name: Peds Colonoscope [id number] Procedure:             Flexible Sigmoidoscopy Indications:           Abnormal abdominal x-ray of the GI tract, Abnormal CT                         of the GI tract, Therapeutic endoscopic decompression                         of the colon, Colonic pseudo-obstruction (Ogilvie's                         syndrome) Providers:             Boykin Nearing. Norma Fredrickson MD, MD Referring MD:          Barbette Reichmann, MD (Referring MD) Medicines:             Propofol per Anesthesia Complications:         No immediate complications. Estimated blood loss: None. Procedure:             Pre-Anesthesia Assessment:                        - The risks and benefits of the procedure and the                         sedation options and risks were discussed with the                         patient. All questions were answered and informed                         consent was obtained.                        - Patient identification and proposed procedure were                         verified prior to the procedure by the nurse. The                         procedure was verified in the procedure room.                        - ASA Grade Assessment: III - A patient with severe                         systemic disease.                        - After reviewing the risks and benefits, the patient                         was deemed in satisfactory condition to undergo the  procedure.                        After obtaining informed consent, the scope was passed                         under direct vision. The Colonoscope was introduced                         through the anus and advanced to the the  left                         transverse colon. The flexible sigmoidoscopy was                         accomplished without difficulty. The patient tolerated                         the procedure well. The quality of the bowel                         preparation was unprepped. Findings:      The perianal and digital rectal examinations were normal. Pertinent       negatives include normal sphincter tone and no palpable rectal lesions.      The lumen of the descending colon, transverse colon and ascending colon       was significantly dilated. Decompression of the colon was attempted and       was successful, with complete decompression achieved. No tube placement       performed.      Proper diagnostic exam was not attempted due to unprepped condition of       the colon and desire to perform a brief therapeutic procedure to       decrease procedural risk. Estimated blood loss: none. Impression:            - Dilated in the descending colon, in the transverse                         colon and in the ascending colon.                        - No specimens collected.                        - Decompression of the right, transverse and                         descending colon via endoscopic suctioning of luminal                         air. Recommendation:        - Return patient to ICU until patient is stable.                        - NPO.                        - GI service will continue to follow the patient's  progress and closely observe for any changes in                         clinical status.                        - Perform a flat plate abdominal x-ray today. Procedure Code(s):     --- Professional ---                        [phone], Sigmoidoscopy, flexible; with decompression                         (for pathologic distention) (eg, volvulus, megacolon),                         including placement of decompression tube, when                          performed Diagnosis Code(s):     --- Professional ---                        R93.3, Abnormal findings on diagnostic imaging of                         other parts of digestive tract                        K59.39, Other megacolon CPT copyright 2022 [employer]. All rights reserved. The codes documented in this report are preliminary and upon coder review may  be revised to meet current compliance requirements. Attending Participation:      I personally performed the entire procedure. Stanton Kidney MD, MD 03/17/2024 12:34:03 PM This report has been signed electronically. Number of Addenda: 0 Note Initiated On: 03/17/2024 11:21 AM Total Procedure Duration: 0 hours 5 minutes 30 seconds  Estimated Blood Loss:  Estimated blood loss: none.      [facility]

## 2024-03-17 NOTE — Anesthesia Preprocedure Evaluation (Signed)
 Anesthesia Evaluation  Patient identified by MRN, date of birth, ID band Patient awake    Reviewed: Allergy & Precautions, NPO status , Patient's Chart, lab work & pertinent test results  History of Anesthesia Complications Negative for: history of anesthetic complications  Airway Mallampati: III  TM Distance: >3 FB Neck ROM: full    Dental  (+) Chipped   Pulmonary shortness of breath, pneumonia (requiring [facility]), unresolved, COPD, former smoker interstitial pulmonary disease   Pulmonary exam normal        Cardiovascular hypertension, + CAD, + Past MI and +CHF  Normal cardiovascular exam  02/2024 IMPRESSIONS     1. Left ventricular ejection fraction, by estimation, is 40%. The left  ventricle has moderately decreased function. The left ventricle  demonstrates regional wall motion abnormalities (see scoring  diagram/findings for description). There is mild left  ventricular hypertrophy. Left ventricular diastolic parameters are  consistent with Grade I diastolic dysfunction (impaired relaxation).   2. Right ventricular systolic function is normal. The right ventricular  size is normal.   3. The mitral valve is normal in structure. Trivial mitral valve  regurgitation.   4. The aortic valve is tricuspid. There is mild thickening of the aortic  valve. Aortic valve regurgitation is not visualized.      Neuro/Psych negative neurological ROS  negative psych ROS   GI/Hepatic Neg liver ROS,GERD  ,,  Endo/Other  negative endocrine ROS    Renal/GU Renal disease  negative genitourinary   Musculoskeletal   Abdominal   Peds  Hematology negative hematology ROS (+)   Anesthesia Other Findings Past Medical History: No date: Actinic keratosis No date: Arthritis No date: Asthma 03/17/2020: Basal cell carcinoma     Comment:  right ant lat deltoid 02/13/2012: CAD (coronary artery disease)     Comment:  Overview:   12/2004 -  Anginal equivalent symptoms:  Left              arm pain, weakness, and shortness of breath.   12/2004:                Anterior STEMI.  Transferred to [facility].   01/06/2005 -               Cardiac cath:  EF 51%, an occluded mid LAD and a large               OM2 with 75% ostial stenosis.  PCI of the mid occluded               LAD using a Cypher stent.   03/2005 - 2D echo:  EF >55%,               LAE 4.7 cm, no significant valvular disease.   02/2008:                Plavix stopped.   06/2008:  Recurrent angina - symptoms               of chest tightness.  Presented to [facility].  Cardiac catheterization.  The patient               stated nonobstructive CAD seen.  Plavix restarted at that              time.   03/2009 - 2D echo:  EF 35%, akinetic apex and  septum.  Stress echocardiogram deferred for cardiac               catheterization.   03/30/2009:  PCI 90% proximal LAD with              a 3.0 x 18 mm Xience DES.   02/2010: Slow and gradual               return of chest tightness, fatigue and generalized               weakness concerning for return of anginal equivalent.                Stress test did not show ischemia to the heart muscle               during exercise.  Evidenc No date: CHF (congestive heart failure) (HCC) No date: Chronic kidney disease     Comment:  kidney stone left side 07/03/2017: COPD (chronic obstructive pulmonary disease) (HCC) 02/13/2012: Dyslipidemia     Comment:  Overview:   03/2009  TC 158, TG 88, HDL 39, LDL 102.                11/2013  TC 148, TG 115, HDL 44.3, LDL 80.7 (done at               PCP's office)   10/2014 TC 146  11/2015 TC 123, TG 86,               HDL 41.7, LDL 64  11/2016 TC 140, TG 129, HDL 40.1, LDL               74  No date: Dyspnea No date: GERD (gastroesophageal reflux disease) 03/09/2013: GI disease     Comment:  Overview:   Dysphagia - EGD demonstrated esophageal                stricture which was dilated   GERD   Hemorrhoids  03/09/2013: Hearing loss No date: History of kidney stones 07/03/2017: Hyperlipidemia, unspecified 02/13/2012: Hypertension 2004: Myocardial infarction (HCC) 12/09/2016: Personal history of gout 05/05/2015: Primary osteoarthritis of both knees 04/14/2016: Right-sided low back pain without sciatica 06/03/2020: Squamous cell carcinoma of skin     Comment:  R ear - ED&C  Past Surgical History: No date: colonoscopy with polypectomy 10/08/2018: COLONOSCOPY WITH PROPOFOL; N/A     Comment:  Procedure: COLONOSCOPY WITH PROPOFOL;  Surgeon: Scot Jun, MD;  Location: [facility] ENDOSCOPY;  Service:               Endoscopy;  Laterality: N/A; 01/06/2023: COLONOSCOPY WITH PROPOFOL; N/A     Comment:  Procedure: COLONOSCOPY WITH PROPOFOL;  Surgeon:               Regis Bill, MD;  Location: [facility] ENDOSCOPY;                Service: Endoscopy;  Laterality: N/A; No date: coronary stents 08/09/2017: CYSTOSCOPY WITH STENT PLACEMENT; Left     Comment:  Procedure: CYSTOSCOPY WITH STENT PLACEMENT;  Surgeon:               Hildred Laser, MD;  Location: [facility] ORS;  Service:               Urology;  Laterality: Left; 2016: MEDIAL PARTIAL KNEE REPLACEMENT; Bilateral 08/09/2017: URETEROSCOPY WITH HOLMIUM LASER LITHOTRIPSY; Left  Comment:  Procedure: URETEROSCOPY WITH HOLMIUM LASER               LITHOTRIPSY/INCISION OF URETEROCELE;  Surgeon: Hildred Laser, MD;  Location: [facility] ORS;  Service: Urology;               Laterality: Left;  BMI    Body Mass Index: 28.88 kg/m      Reproductive/Obstetrics negative OB ROS                              Anesthesia Physical Anesthesia Plan  ASA: 3  Anesthesia Plan: General   Post-op Pain Management: Minimal or no pain anticipated   Induction: Intravenous  PONV Risk Score and Plan: 1 and Propofol infusion and TIVA  Airway Management Planned:  Natural Airway and Nasal Cannula  Additional Equipment:   Intra-op Plan:   Post-operative Plan:   Informed Consent: I have reviewed the patients History and Physical, chart, labs and discussed the procedure including the risks, benefits and alternatives for the proposed anesthesia with the patient or authorized representative who has indicated his/her understanding and acceptance.     Dental Advisory Given  Plan Discussed with: Anesthesiologist, CRNA and Surgeon  Anesthesia Plan Comments: (Patient consented for risks of anesthesia including but not limited to:  - adverse reactions to medications - risk of airway placement if required - damage to eyes, teeth, lips or other oral mucosa - nerve damage due to positioning  - sore throat or hoarseness - Damage to heart, brain, nerves, lungs, other parts of body or loss of life  Patient voiced understanding and assent.)         Anesthesia Quick Evaluation

## 2024-03-17 NOTE — Progress Notes (Signed)
 Patient ID: Todd Rojas, male   DOB: 03/30/1941, 83 y.o.   MRN: [id number] [facility] Cardiology    SUBJECTIVE: Resting quietly in bed for EGD.   Vitals:   03/17/24 1000 03/17/24 1018 03/17/24 1100 03/17/24 1129  BP: 126/64 126/84 132/68   Pulse: 73 73 76 72  Resp: 15 (!) 24 17 (!) 28  Temp:      TempSrc:      SpO2: 94% 93% 93% 90%  Weight:      Height:         Intake/Output Summary (Last 24 hours) at 03/17/2024 1135 Last data filed at 03/17/2024 0806 Gross per 24 hour  Intake 1477.75 ml  Output 1800 ml  Net -322.25 ml      PHYSICAL EXAM  General: Well developed, well nourished, in no acute distress HEENT:  Normocephalic and atramatic Neck:  No JVD.  Lungs: Clear bilaterally to auscultation and percussion. Heart: HRRR . Normal S1 and S2 without gallops or murmurs.  Abdomen: Bowel sounds are positive, abdomen soft and non-tender  Msk:  Back normal, normal gait. Normal strength and tone for age. Extremities: No clubbing, cyanosis or edema.   Neuro: Alert and oriented X 3. Psych:  Good affect, responds appropriately   LABS: Basic Metabolic Panel: Recent Labs    03/16/24 0547 03/17/24 0318  NA 132* 136  K 4.6 4.1  CL 98 101  CO2 26 25  GLUCOSE 147* 116*  BUN 34* 35*  CREATININE 1.25* 1.09  CALCIUM 8.1* 8.0*  MG 2.5* 2.5*   Liver Function Tests: Recent Labs    03/16/24 0547  AST 21  ALT 20  ALKPHOS 38  BILITOT 0.6  PROT 6.5  ALBUMIN 2.1*   No results for input(s): "LIPASE", "AMYLASE" in the last 72 hours. CBC: Recent Labs    03/16/24 0547 03/17/24 0318  WBC 9.0 10.5  HGB 11.5* 11.1*  HCT 34.8* 33.3*  MCV 97.2 98.5  PLT 293 287   Cardiac Enzymes: No results for input(s): "CKTOTAL", "CKMB", "CKMBINDEX", "TROPONINI" in the last 72 hours. BNP: Invalid input(s): "POCBNP" D-Dimer: No results for input(s): "DDIMER" in the last 72 hours. Hemoglobin A1C: No results for input(s): "HGBA1C" in the last 72 hours. Fasting Lipid Panel: No results  for input(s): "CHOL", "HDL", "LDLCALC", "TRIG", "CHOLHDL", "LDLDIRECT" in the last 72 hours. Thyroid Function Tests: No results for input(s): "TSH", "T4TOTAL", "T3FREE", "THYROIDAB" in the last 72 hours.  Invalid input(s): "FREET3" Anemia Panel: No results for input(s): "VITAMINB12", "FOLATE", "FERRITIN", "TIBC", "IRON", "RETICCTPCT" in the last 72 hours.  DG ABD ACUTE 2+V W 1V CHEST Result Date: 03/17/2024 CLINICAL DATA:  Ogilvie syndrome. EXAM: DG ABDOMEN ACUTE WITH 1 VIEW CHEST COMPARISON:  03/16/2024 FINDINGS: Gaseous distension of the colon is again noted. This appears similar to the previous exam. Right lower quadrant: Measures 12 cm in diameter compared with the same previously. Enteric contrast material is identified within the rectum. Stable cardiomediastinal contours. Low lung volumes with asymmetric elevation of the right hemidiaphragm. Bibasilar atelectasis noted. No airspace consolidation. IMPRESSION: 1. Persistent gaseous distension of the colon. Unchanged compared with the prior exam. 2. Low lung volumes with bibasilar atelectasis. Electronically Signed   By: Signa Kell M.D.   On: 03/17/2024 09:59   DG Abd 1 View Result Date: 03/16/2024 CLINICAL DATA:  Ogilvie syndrome EXAM: ABDOMEN - 1 VIEW COMPARISON:  03/15/2024 FINDINGS: Gaseous distension of the colon slightly improved since prior study. Maximum diameter of the cecum/ascending colon 12.9 cm compared to 15 cm  previously. No small bowel dilatation. No organomegaly or free air. IMPRESSION: Moderate gaseous distention of the colon with some improvement since prior study. Electronically Signed   By: Charlett Nose M.D.   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  Abdominal distension. EXAM: DG ABDOMEN ACUTE WITH 1 VIEW CHEST COMPARISON:  03/15/2024 FINDINGS: Diffuse gaseous distension of the colon is again noted. Right lower quadrant bowel loop measures 14.5 cm in  diameter, unchanged from previous exam. Rectal contrast material is identified as noted previously. No signs of free air. Stable cardiomediastinal contours. Low lung volumes. Persistent bibasilar pulmonary opacities, right greater than left. IMPRESSION: 1. Persistent diffuse gaseous distension of the colon. Similar to previous exam. 2. Persistent bibasilar pulmonary opacities, right greater than left. Electronically Signed   By: Signa Kell M.D.   On: 03/16/2024 10:53   CT ABDOMEN PELVIS W CONTRAST Result Date: 03/15/2024 CLINICAL DATA:  Ogilvie syndrome.  Abdominal distension. EXAM: CT ABDOMEN AND PELVIS WITH CONTRAST TECHNIQUE: Multidetector CT imaging of the abdomen and pelvis was performed using the standard protocol following bolus administration of intravenous contrast. RADIATION DOSE REDUCTION: This exam was performed according to the departmental dose-optimization program which includes automated exposure control, adjustment of the mA and/or kV according to patient size and/or use of iterative reconstruction technique. CONTRAST:  OMNIPAQUE IOHEXOL 300 MG/ML  SOLN COMPARISON:  03/12/2024.  Plain films earlier today. FINDINGS: Lower chest: Atelectasis in the lower lobes bilaterally, stable since prior study. No effusions. Coronary artery and aortic atherosclerosis. Hepatobiliary: No focal hepatic abnormality. Gallbladder unremarkable. Pancreas: No focal abnormality or ductal dilatation. Spleen: No focal abnormality.  Normal size. Adrenals/Urinary Tract: Adrenal glands normal. Areas of scarring in the left kidney. No suspicious renal abnormality, stones or hydronephrosis. Urinary bladder unremarkable. Stomach/Bowel: Gaseous distension of the right colon and transverse colon and descending colon. Rectosigmoid colon are decompressed. Findings are stable when compared to prior CT and plain films. Stomach and small bowel decompressed, unremarkable. Vascular/Lymphatic: Aortic atherosclerosis. No evidence  of aneurysm or adenopathy. Reproductive: Prostate enlargement Other: No free fluid or free air. Musculoskeletal: No acute bony abnormality. IMPRESSION: Gaseous distention of the colon to the level of the sigmoid colon where there is gradual decompression. Findings most compatible with ileus and are unchanged since prior CT and plain films. Coronary artery disease, aortic atherosclerosis. Prostate enlargement. Stable bibasilar atelectasis. Electronically Signed   By: Charlett Nose M.D.   On: 03/15/2024 17:09     Echo   TELEMETRY: Sinus rhythm:  ASSESSMENT AND PLAN:  Principal Problem:   Severe sepsis with acute organ dysfunction (HCC) Active Problems:   CAD (coronary artery disease)   COPD (chronic obstructive pulmonary disease) (HCC)   Dyslipidemia   Hyperlipidemia, unspecified   Hypertension   Personal history of gout   Primary osteoarthritis of both knees   Elevated serum creatinine   Elevated troponin   Fever   Thyroid nodule greater than or equal to 1.5 cm in diameter incidentally noted on imaging study   Acute hypoxic respiratory failure (HCC)   Gaseous abdominal distention   Ogilvie syndrome     Plan Preop for GI procedure EGD for decompression patient is a at least a moderate risk Severe sepsis with organ dysfunction continue current therapy Multivessel coronary artery disease continue conservative management Mildly reduced left ventricular function around 40% continue current management Hyperlipidemia continue Crestor therapy for lipid management Ileus generalized considered neostigmine but  unable to continue because of bradycardia Continue conservative cardiac input at this stage     Alwyn Pea, MD 03/17/2024 11:35 AM

## 2024-03-17 NOTE — Consult Note (Signed)
 CC: Ogilvie's  Subjective: He feels bloated and endorses intermittent pain, did have bm and flatus KUB pers reviewed persistent dilation of cecum around 12.5 cm, no free air Extensive discussion w GI and his wife about current condition  Objective: Vital signs in last 24 hours: Temp:  [97.7 F (36.5 C)-98.1 F (36.7 C)] 97.7 F (36.5 C) (03/30 0800) Pulse Rate:  [67-86] 73 (03/30 1018) Resp:  [14-24] 24 (03/30 1018) BP: (123-161)/(63-84) 126/84 (03/30 1018) SpO2:  [90 %-97 %] 93 % (03/30 1018) Weight:  [83.5 kg-88.7 kg] 88.7 kg (03/30 0756) Last BM Date : 03/17/24  Intake/Output from previous day: 03/29 0701 - 03/30 0700 In: 1689.6 [P.O.:120; I.V.:1487.6; IV Piggyback:51.9] Out: 1915 [Urine:1600; Stool:315] Intake/Output this shift: Total I/O In: 64.9 [I.V.:64.9] Out: 335 [Urine:300; Stool:35]  Physical exam: Confused but answers questions, not toxic Abd: soft, distended, decrease bs, mild TTP w/o peritonitis  Lab Results: CBC  Recent Labs    03/16/24 0547 03/17/24 0318  WBC 9.0 10.5  HGB 11.5* 11.1*  HCT 34.8* 33.3*  PLT 293 287   BMET Recent Labs    03/16/24 0547 03/17/24 0318  NA 132* 136  K 4.6 4.1  CL 98 101  CO2 26 25  GLUCOSE 147* 116*  BUN 34* 35*  CREATININE 1.25* 1.09  CALCIUM 8.1* 8.0*   PT/INR No results for input(s): "LABPROT", "INR" in the last 72 hours. ABG No results for input(s): "PHART", "HCO3" in the last 72 hours.  Invalid input(s): "PCO2", "PO2"  Studies/Results: DG ABD ACUTE 2+V W 1V CHEST Result Date: 03/17/2024 CLINICAL DATA:  Ogilvie syndrome. EXAM: DG ABDOMEN ACUTE WITH 1 VIEW CHEST COMPARISON:  03/16/2024 FINDINGS: Gaseous distension of the colon is again noted. This appears similar to the previous exam. Right lower quadrant: Measures 12 cm in diameter compared with the same previously. Enteric contrast material is identified within the rectum. Stable cardiomediastinal contours. Low lung volumes with asymmetric elevation of  the right hemidiaphragm. Bibasilar atelectasis noted. No airspace consolidation. IMPRESSION: 1. Persistent gaseous distension of the colon. Unchanged compared with the prior exam. 2. Low lung volumes with bibasilar atelectasis. Electronically Signed   By: Signa Kell M.D.   On: 03/17/2024 09:59   DG Abd 1 View Result Date: 03/16/2024 CLINICAL DATA:  Ogilvie syndrome EXAM: ABDOMEN - 1 VIEW COMPARISON:  03/15/2024 FINDINGS: Gaseous distension of the colon slightly improved since prior study. Maximum diameter of the cecum/ascending colon 12.9 cm compared to 15 cm previously. No small bowel dilatation. No organomegaly or free air. IMPRESSION: Moderate gaseous distention of the colon with some improvement since prior study. Electronically Signed   By: Charlett Nose M.D.   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  Abdominal distension. EXAM: DG ABDOMEN ACUTE WITH 1 VIEW CHEST COMPARISON:  03/15/2024 FINDINGS: Diffuse gaseous distension of the colon is again noted. Right lower quadrant bowel loop measures 14.5 cm in diameter, unchanged from previous exam. Rectal contrast material is identified as noted previously. No signs of free air. Stable cardiomediastinal contours. Low lung volumes. Persistent bibasilar pulmonary opacities, right greater than left. IMPRESSION: 1. Persistent diffuse gaseous distension of the colon. Similar to previous exam. 2. Persistent bibasilar pulmonary opacities, right greater than left. Electronically Signed   By: Signa Kell M.D.   On: 03/16/2024 10:53   CT ABDOMEN PELVIS W CONTRAST Result Date: 03/15/2024 CLINICAL DATA:  Ogilvie syndrome.  Abdominal distension. EXAM: CT ABDOMEN  AND PELVIS WITH CONTRAST TECHNIQUE: Multidetector CT imaging of the abdomen and pelvis was performed using the standard protocol following bolus administration of intravenous contrast. RADIATION DOSE REDUCTION: This exam was performed  according to the departmental dose-optimization program which includes automated exposure control, adjustment of the mA and/or kV according to patient size and/or use of iterative reconstruction technique. CONTRAST:  OMNIPAQUE IOHEXOL 300 MG/ML  SOLN COMPARISON:  03/12/2024.  Plain films earlier today. FINDINGS: Lower chest: Atelectasis in the lower lobes bilaterally, stable since prior study. No effusions. Coronary artery and aortic atherosclerosis. Hepatobiliary: No focal hepatic abnormality. Gallbladder unremarkable. Pancreas: No focal abnormality or ductal dilatation. Spleen: No focal abnormality.  Normal size. Adrenals/Urinary Tract: Adrenal glands normal. Areas of scarring in the left kidney. No suspicious renal abnormality, stones or hydronephrosis. Urinary bladder unremarkable. Stomach/Bowel: Gaseous distension of the right colon and transverse colon and descending colon. Rectosigmoid colon are decompressed. Findings are stable when compared to prior CT and plain films. Stomach and small bowel decompressed, unremarkable. Vascular/Lymphatic: Aortic atherosclerosis. No evidence of aneurysm or adenopathy. Reproductive: Prostate enlargement Other: No free fluid or free air. Musculoskeletal: No acute bony abnormality. IMPRESSION: Gaseous distention of the colon to the level of the sigmoid colon where there is gradual decompression. Findings most compatible with ileus and are unchanged since prior CT and plain films. Coronary artery disease, aortic atherosclerosis. Prostate enlargement. Stable bibasilar atelectasis. Electronically Signed   By: Charlett Nose M.D.   On: 03/15/2024 17:09    Anti-infectives: Anti-infectives (From admission, onward)    Start     Dose/Rate Route Frequency Ordered Stop   03/13/24 1200  levofloxacin (LEVAQUIN) tablet 750 mg        750 mg Oral Daily 03/13/24 1148 03/15/24 1413   03/10/24 1600  vancomycin (VANCOREADY) IVPB 1250 mg/250 mL  Status:  Discontinued        1,250  mg 166.7 mL/hr over 90 Minutes Intravenous Every 24 hours 03/10/24 1056 03/10/24 1250   03/10/24 0945  azithromycin (ZITHROMAX) 500 mg in sodium chloride 0.9 % 250 mL IVPB        500 mg 250 mL/hr over 60 Minutes Intravenous Every 24 hours 03/10/24 0934 03/12/24 1024   03/10/24 0200  metroNIDAZOLE (FLAGYL) IVPB 500 mg  Status:  Discontinued        500 mg 100 mL/hr over 60 Minutes Intravenous Every 12 hours 03/09/24 1515 03/10/24 0933   03/10/24 0000  ceFEPIme (MAXIPIME) 2 g in sodium chloride 0.9 % 100 mL IVPB  Status:  Discontinued        2 g 200 mL/hr over 30 Minutes Intravenous Every 12 hours 03/09/24 1541 03/13/24 1148   03/09/24 1600  vancomycin (VANCOCIN) IVPB 1000 mg/200 mL premix        1,000 mg 200 mL/hr over 60 Minutes Intravenous  Once 03/09/24 1537 03/09/24 1834   03/09/24 1537  vancomycin variable dose per unstable renal function (pharmacist dosing)  Status:  Discontinued         Does not apply See admin instructions 03/09/24 1537 03/10/24 1056   03/09/24 1515  metroNIDAZOLE (FLAGYL) IVPB 500 mg  Status:  Discontinued        500 mg 100 mL/hr over 60 Minutes Intravenous Every 12 hours 03/09/24 1504 03/09/24 1515   03/09/24 1200  ceFEPIme (MAXIPIME) 2 g in sodium chloride 0.9 % 100 mL IVPB        2 g 200 mL/hr over 30 Minutes Intravenous  Once 03/09/24 1152 03/09/24 1304  03/09/24 1200  metroNIDAZOLE (FLAGYL) IVPB 500 mg        500 mg 100 mL/hr over 60 Minutes Intravenous  Once 03/09/24 1152 03/09/24 1505   03/09/24 1200  vancomycin (VANCOCIN) IVPB 1000 mg/200 mL premix        1,000 mg 200 mL/hr over 60 Minutes Intravenous  Once 03/09/24 1152 03/09/24 1523       Assessment/Plan:  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

## 2024-03-17 NOTE — Transfer of Care (Signed)
 Immediate Anesthesia Transfer of Care Note  Patient: Todd Rojas  Procedure(s) Performed: Procedure(s): SIGMOIDOSCOPY, FLEXIBLE (N/A)  Patient Location: PACU and Endoscopy Unit  Anesthesia Type:General  Level of Consciousness: sedated  Airway & Oxygen Therapy: Patient Spontanous Breathing and Patient connected to nasal cannula oxygen  Post-op Assessment: Report given to RN and Post -op Vital signs reviewed and stable  Post vital signs: Reviewed and stable  Last Vitals:  Vitals:   03/17/24 1140 03/17/24 1229  BP:  (!) 101/52  Pulse: 80 72  Resp: (!) 29 17  Temp:    SpO2: 95% 95%    Complications: No apparent anesthesia complications

## 2024-03-17 NOTE — Anesthesia Postprocedure Evaluation (Signed)
 Anesthesia Post Note  Patient: Todd Rojas  Procedure(s) Performed: Arnell Sieving  Patient location during evaluation: Endoscopy Anesthesia Type: General Level of consciousness: awake and alert Pain management: pain level controlled Vital Signs Assessment: post-procedure vital signs reviewed and stable Respiratory status: spontaneous breathing, nonlabored ventilation, respiratory function stable and patient connected to nasal cannula oxygen Cardiovascular status: blood pressure returned to baseline and stable Postop Assessment: no apparent nausea or vomiting Anesthetic complications: no   No notable events documented.   Last Vitals:  Vitals:   03/17/24 1258 03/17/24 1300  BP: 125/67 126/65  Pulse: 75 76  Resp: 18 (!) 21  Temp: 36.7 C   SpO2: 95% 94%    Last Pain:  Vitals:   03/17/24 1258  TempSrc: Axillary  PainSc: Asleep                 Louie Boston

## 2024-03-17 NOTE — Progress Notes (Signed)
 [facility] Gastroenterology Inpatient Progress Note    Subjective: Patient seen for followup of colonic pseudo-obstructioin. Patient still ill-appearing. Reportedly passed flatus this AM and had stool output into flexiseal. Abdominal x-ray appears to show persistent gaseous distension, however, only minimally improved after use of neostigmine yesterday. We are being asked by Dr. Everlene Farrier of general surgery to perform colonic decompression today.  Objective: Vital signs in last 24 hours: Temp:  [97.7 F (36.5 C)-98.1 F (36.7 C)] 97.7 F (36.5 C) (03/30 0800) Pulse Rate:  [67-86] 73 (03/30 1018) Resp:  [14-24] 24 (03/30 1018) BP: (123-157)/(63-84) 126/84 (03/30 1018) SpO2:  [90 %-97 %] 93 % (03/30 1018) Weight:  [83.5 kg-88.7 kg] 88.7 kg (03/30 0756) Blood pressure 126/84, pulse 73, temperature 97.7 F (36.5 C), temperature source Oral, resp. rate (!) 24, height 5\' 9"  (1.753 m), weight 88.7 kg, SpO2 93%.    Intake/Output from previous day: 03/29 0701 - 03/30 0700 In: 1689.6 [P.O.:120; I.V.:1487.6; IV Piggyback:51.9] Out: 1915 [Urine:1600; Stool:315]  Intake/Output this shift: Total I/O In: 64.9 [I.V.:64.9] Out: 335 [Urine:300; Stool:35]   Gen: NAD. Appears uncomfortable.   HEENT: [place]/AT. PERRLA. Normal external ear exam.  Chest: CTA, no wheezes.  CV: RR nl S1, S2. No gallops.  Abd: Distended, nt, . BS+  Ext: no edema. Pulses 2+  Neuro: Alert and oriented. Judgement appears normal. Nonfocal.   Lab Results: Results for orders placed or performed during the hospital encounter of 03/09/24 (from the past 24 hours)  CBC     Status: Abnormal   Collection Time: 03/17/24  3:18 AM  Result Value Ref Range   WBC 10.5 4.0 - 10.5 K/uL   RBC 3.38 (L) 4.22 - 5.81 MIL/uL   Hemoglobin 11.1 (L) 13.0 - 17.0 g/dL   HCT 16.1 (L) 09.6 - 04.5 %   MCV 98.5 80.0 - 100.0 fL   MCH 32.8 26.0 - 34.0 pg   MCHC 33.3 30.0 - 36.0 g/dL   RDW 40.9 81.1 - 91.4 %   Platelets 287 150 - 400 K/uL    nRBC 0.2 0.0 - 0.2 %  Magnesium     Status: Abnormal   Collection Time: 03/17/24  3:18 AM  Result Value Ref Range   Magnesium 2.5 (H) 1.7 - 2.4 mg/dL  Basic metabolic panel with GFR     Status: Abnormal   Collection Time: 03/17/24  3:18 AM  Result Value Ref Range   Sodium 136 135 - 145 mmol/L   Potassium 4.1 3.5 - 5.1 mmol/L   Chloride 101 98 - 111 mmol/L   CO2 25 22 - 32 mmol/L   Glucose, Bld 116 (H) 70 - 99 mg/dL   BUN 35 (H) 8 - 23 mg/dL   Creatinine, Ser 7.82 0.61 - 1.24 mg/dL   Calcium 8.0 (L) 8.9 - 10.3 mg/dL   GFR, Estimated >95 >62 mL/min   Anion gap 10 5 - 15     Recent Labs    03/16/24 0547 03/17/24 0318  WBC 9.0 10.5  HGB 11.5* 11.1*  HCT 34.8* 33.3*  PLT 293 287   BMET Recent Labs    03/15/24 0443 03/16/24 0547 03/17/24 0318  NA 134* 132* 136  K 3.8 4.6 4.1  CL 98 98 101  CO2 27 26 25   GLUCOSE 108* 147* 116*  BUN 30* 34* 35*  CREATININE 1.50* 1.25* 1.09  CALCIUM 8.2* 8.1* 8.0*   LFT Recent Labs    03/16/24 0547  PROT 6.5  ALBUMIN 2.1*  AST 21  ALT 20  ALKPHOS 38  BILITOT 0.6   PT/INR No results for input(s): "LABPROT", "INR" in the last 72 hours. Hepatitis Panel No results for input(s): "HEPBSAG", "HCVAB", "HEPAIGM", "HEPBIGM" in the last 72 hours. C-Diff No results for input(s): "CDIFFTOX" in the last 72 hours. No results for input(s): "CDIFFPCR" in the last 72 hours.   Studies/Results: DG ABD ACUTE 2+V W 1V CHEST Result Date: 03/17/2024 CLINICAL DATA:  Ogilvie syndrome. EXAM: DG ABDOMEN ACUTE WITH 1 VIEW CHEST COMPARISON:  03/16/2024 FINDINGS: Gaseous distension of the colon is again noted. This appears similar to the previous exam. Right lower quadrant: Measures 12 cm in diameter compared with the same previously. Enteric contrast material is identified within the rectum. Stable cardiomediastinal contours. Low lung volumes with asymmetric elevation of the right hemidiaphragm. Bibasilar atelectasis noted. No airspace consolidation.  IMPRESSION: 1. Persistent gaseous distension of the colon. Unchanged compared with the prior exam. 2. Low lung volumes with bibasilar atelectasis. Electronically Signed   By: Signa Kell M.D.   On: 03/17/2024 09:59   DG Abd 1 View Result Date: 03/16/2024 CLINICAL DATA:  Ogilvie syndrome EXAM: ABDOMEN - 1 VIEW COMPARISON:  03/15/2024 FINDINGS: Gaseous distension of the colon slightly improved since prior study. Maximum diameter of the cecum/ascending colon 12.9 cm compared to 15 cm previously. No small bowel dilatation. No organomegaly or free air. IMPRESSION: Moderate gaseous distention of the colon with some improvement since prior study. Electronically Signed   By: Charlett Nose M.D.   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  Abdominal distension. EXAM: DG ABDOMEN ACUTE WITH 1 VIEW CHEST COMPARISON:  03/15/2024 FINDINGS: Diffuse gaseous distension of the colon is again noted. Right lower quadrant bowel loop measures 14.5 cm in diameter, unchanged from previous exam. Rectal contrast material is identified as noted previously. No signs of free air. Stable cardiomediastinal contours. Low lung volumes. Persistent bibasilar pulmonary opacities, right greater than left. IMPRESSION: 1. Persistent diffuse gaseous distension of the colon. Similar to previous exam. 2. Persistent bibasilar pulmonary opacities, right greater than left. Electronically Signed   By: Signa Kell M.D.   On: 03/16/2024 10:53   CT ABDOMEN PELVIS W CONTRAST Result Date: 03/15/2024 CLINICAL DATA:  Ogilvie syndrome.  Abdominal distension. EXAM: CT ABDOMEN AND PELVIS WITH CONTRAST TECHNIQUE: Multidetector CT imaging of the abdomen and pelvis was performed using the standard protocol following bolus administration of intravenous contrast. RADIATION DOSE REDUCTION: This exam was performed according to the departmental dose-optimization program which includes automated  exposure control, adjustment of the mA and/or kV according to patient size and/or use of iterative reconstruction technique. CONTRAST:  OMNIPAQUE IOHEXOL 300 MG/ML  SOLN COMPARISON:  03/12/2024.  Plain films earlier today. FINDINGS: Lower chest: Atelectasis in the lower lobes bilaterally, stable since prior study. No effusions. Coronary artery and aortic atherosclerosis. Hepatobiliary: No focal hepatic abnormality. Gallbladder unremarkable. Pancreas: No focal abnormality or ductal dilatation. Spleen: No focal abnormality.  Normal size. Adrenals/Urinary Tract: Adrenal glands normal. Areas of scarring in the left kidney. No suspicious renal abnormality, stones or hydronephrosis. Urinary bladder unremarkable. Stomach/Bowel: Gaseous distension of the right colon and transverse colon and descending colon. Rectosigmoid colon are decompressed. Findings are stable when compared to prior CT and plain films. Stomach and small bowel decompressed, unremarkable. Vascular/Lymphatic: Aortic atherosclerosis. No evidence of aneurysm or adenopathy. Reproductive: Prostate enlargement Other: No free fluid or free air. Musculoskeletal: No acute bony abnormality. IMPRESSION:  Gaseous distention of the colon to the level of the sigmoid colon where there is gradual decompression. Findings most compatible with ileus and are unchanged since prior CT and plain films. Coronary artery disease, aortic atherosclerosis. Prostate enlargement. Stable bibasilar atelectasis. Electronically Signed   By: Charlett Nose M.D.   On: 03/15/2024 17:09    Scheduled Inpatient Medications:    allopurinol  300 mg Oral Daily   aspirin EC  81 mg Oral Daily   Chlorhexidine Gluconate Cloth  6 each Topical Daily   cyanocobalamin  1,000 mcg Oral Daily   diclofenac Sodium  2 g Topical TID   enoxaparin (LOVENOX) injection  45 mg Subcutaneous Q24H   metoprolol succinate  25 mg Oral Daily   mometasone-formoterol  2 puff Inhalation BID   predniSONE  40 mg  Oral Q breakfast   rosuvastatin  20 mg Oral QHS    Continuous Inpatient Infusions:    PRN Inpatient Medications:  alum & mag hydroxide-simeth, dicyclomine, iohexol, nitroGLYCERIN, traMADol  Miscellaneous: N/A  Assessment:  Persistent colonic-pseudoobstruction.  Plan:  Proceed with flexible sigmoidoscopy with colonic decompression and possible colonic tube placement.The patient understands the nature of the planned procedure, indications, risks, alternatives and potential complications including but not limited to bleeding, infection, perforation, damage to internal organs and possible oversedation/side effects from anesthesia. The patient agrees and gives consent to proceed.  Please refer to procedure notes for findings, recommendations and patient disposition/instructions.  Daphene Chisholm K. Norma Fredrickson, M.D. 03/17/2024, 11:00 AM

## 2024-03-17 NOTE — Plan of Care (Signed)
 The patient remains on AR-ICU at time of writing; pending transfer to PCU. The patient is s/p flex-sig today. Abdomen is more soft this evening over last night. FMS and external urine collection system remain in place. MIVF has been discontinued. No acute changes overnight.   Problem: Fluid Volume: Goal: Hemodynamic stability will improve Outcome: Progressing   Problem: Clinical Measurements: Goal: Diagnostic test results will improve Outcome: Progressing Goal: Signs and symptoms of infection will decrease Outcome: Progressing   Problem: Respiratory: Goal: Ability to maintain adequate ventilation will improve Outcome: Progressing   Problem: Education: Goal: Knowledge of General Education information will improve Description: Including pain rating scale, medication(s)/side effects and non-pharmacologic comfort measures Outcome: Progressing   Problem: Health Behavior/Discharge Planning: Goal: Ability to manage health-related needs will improve Outcome: Progressing   Problem: Clinical Measurements: Goal: Ability to maintain clinical measurements within normal limits will improve Outcome: Progressing Goal: Will remain free from infection Outcome: Progressing Goal: Diagnostic test results will improve Outcome: Progressing Goal: Respiratory complications will improve Outcome: Progressing Goal: Cardiovascular complication will be avoided Outcome: Progressing   Problem: Activity: Goal: Risk for activity intolerance will decrease Outcome: Progressing   Problem: Nutrition: Goal: Adequate nutrition will be maintained Outcome: Progressing   Problem: Coping: Goal: Level of anxiety will decrease Outcome: Progressing   Problem: Elimination: Goal: Will not experience complications related to bowel motility Outcome: Progressing Goal: Will not experience complications related to urinary retention Outcome: Progressing   Problem: Pain Managment: Goal: General experience of  comfort will improve and/or be controlled Outcome: Progressing   Problem: Safety: Goal: Ability to remain free from injury will improve Outcome: Progressing   Problem: Skin Integrity: Goal: Risk for impaired skin integrity will decrease Outcome: Progressing

## 2024-03-17 NOTE — Progress Notes (Signed)
 PROGRESS NOTE    Todd Rojas  WGN:[id number] DOB: 1941-10-16 DOA: 03/09/2024 PCP: Barbette Reichmann, MD  IC10A/IC10A-AA  LOS: 8 days   Brief hospital course:   Assessment & Plan: Todd Rojas is an 83 year old male with history of CKD stage IIIa, chronic gout, hypertension, hyperlipidemia, interstitial pulmonary disease, bronchiectasis, COPD, who presents emergency department for chief concerns of fever, chills, shortness of breath for 2 weeks.   Vitals in the ED showed temperature of 98.4, respiration rate 25, heart rate 94, blood pressure initially 96/69 and improved to 107/66, SpO2 initially desatted to 88% on room air   Gaseous abdominal distention 2/2 Colonic ileus  Ogilvie's syndrome Patient developed abdominal distention and crampy abdominal pain after presentation. Starting 3/25, Imaging with right colonic gaseous distention without any obvious obstruction.  Colonic distention has been worsening. --GenSurg and GI consulted, dx with Ogilvie's syndrome --neostigmine 2 mg push morning of 3/29 with [name] and [name] present with passing of gas and BM afterwards, however, colonic distention unchanged today. --GI for endoscopic decompression today  Bradycardia  2/2 neostigmine administration.  S/p atropine. --tele monitor  * Severe sepsis with acute organ dysfunction (HCC) 2/2 PNA Elevated lactic acid of 2.6, increased respiration rate, with fever of 102 F at home, organ involvement are cardiac and pulmonary with hypoxic respiratory failure at 88% on room air, elevated high sensitive troponin of 98 and elevated BNP --Imaging concerning for right lower lobe pneumonia, although not much upper respiratory symptoms.  Procalcitonin at 0.352. Respiratory panels were negative. UA does not consistent with UTI Patient received broad-spectrum antibiotics -Completed 5 days of cefepime and Zithromax-switching to Levaquin for 2 more days -Patient will need a repeat CT chest in  3 to 4 weeks to see the resolution of current abnormalities   Acute hypoxic respiratory failure (HCC) Likely with pneumonia Currently on 2L --Continue supplemental O2 to keep sats >=92%, wean as tolerated  Fever Afebrile now. Patient had 1 set of blood cultures done on 3/19 03/2024: Which was read as no growth, 5 days Preliminary repeat blood cultures negative in 4 days Concern of right lower lobe pneumonia. -Supportive care and antibiotics as listed above   Elevated troponin 2/2 demand ischemia First high sensitivity troponin was elevated at 98 >>101>175>226,  Suspect secondary to demand ischemia in setting of severe sepsis with acute hypoxic respiratory failure Echocardiogram with EF of 40% and regional wall motion abnormalities, discussed with cardiology and they think these abnormalities are chronic. Patient was also experiencing intermittent chest pain over the past couple of months which seems more exertional --cardio consulted, no plan for cardiac workup   CAD (coronary artery disease) With history of anterior STEMI in 2006, transferred to [facility] status post cardiac catheterization with PCI to mid LAD using Cypher stent; currently no longer on Plavix Complete echo on 05/17/2022: Estimated ejection fraction is 55%, trivial mitral regurgitation, trivial tricuspid regurgitation, trivial pulmonary valve regurgitation; aortic valve has normal variant --cont home ASA and statin   COPD (chronic obstructive pulmonary disease) (HCC) --cont home bronchodilator   Elevated serum creatinine Does not meet acute kidney injury, suspect prerenal in setting of nausea vomiting, creatinine with some improvement to 1.36 today Serum creatinine baseline is 1.35-1.36/eGFR 52-55, consistent with CKD stage III A -Monitor renal function -Avoid nephrotoxins   Hypertension, not currently active Blood pressure borderline soft Home furosemide and lisinopril 20 mg daily were not resumed on admission in  setting of increase in sCr level from baseline -Continue holding home antihypertensives  Hyperlipidemia, unspecified --cont home statin   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 Imaging was obtained which shows very mild right knee effusion and no effusion on left. --Voltaren gel and heat pad   Thyroid nodule greater than or equal to 1.5 cm in diameter incidentally noted on imaging study Incidental finding of a 2.7 cm thyroid nodule. -Thyroid ultrasound with solitary nodule which required outpatient biopsy -Check thyroid panel with TSH-pending results   Personal history of gout --cont home allopurinol  Rash Developed this morning, all over chest, back and bilateral thighs.  Appeared to be allergic rxn. --cont prednisone 40 mg daily   DVT prophylaxis: Lovenox SQ Code Status: DNR  Family Communication: wife updated at bedside today Level of care: Progressive Dispo:   The patient is from: home Anticipated d/c is to: SNF rehab Anticipated d/c date is: to be determined   Subjective and Interval History:  Pt reported some dyspnea.  Underwent endoscopic decompression.   Objective: Vitals:   03/17/24 1300 03/17/24 1400 03/17/24 1438 03/17/24 1500  BP: 126/65 136/67  (!) 115/57  Pulse: 76 76 80 77  Resp: (!) 21 17 19 15   Temp:      TempSrc:      SpO2: 94% 91% 90% 92%  Weight:      Height:        Intake/Output Summary (Last 24 hours) at 03/17/2024 1528 Last data filed at 03/17/2024 1504 Gross per 24 hour  Intake 1733.92 ml  Output 2575 ml  Net -841.08 ml   Filed Weights   03/15/24 1910 03/17/24 0400 03/17/24 0756  Weight: 97.2 kg 83.5 kg 88.7 kg    Examination:   Constitutional: NAD, alert, coherent HEENT: conjunctivae and lids normal, EOMI CV: No cyanosis.   RESP: normal respiratory effort, on 2L Extremities: edema in BLE SKIN: warm, dry Neuro: II - XII grossly intact.   Rectal tube present with liquid output   Data  Reviewed: I have personally reviewed labs and imaging studies  Time spent: 35 minutes  Darlin Priestly, MD [facility] If 7PM-7AM, please contact night-coverage 03/17/2024, 3:28 PM

## 2024-03-18 ENCOUNTER — Encounter: Payer: Self-pay | Admitting: Internal Medicine

## 2024-03-18 ENCOUNTER — Other Ambulatory Visit: Payer: Self-pay

## 2024-03-18 DIAGNOSIS — K5981 Ogilvie syndrome: Secondary | ICD-10-CM | POA: Diagnosis not present

## 2024-03-18 DIAGNOSIS — A419 Sepsis, unspecified organism: Secondary | ICD-10-CM | POA: Diagnosis not present

## 2024-03-18 DIAGNOSIS — R6520 Severe sepsis without septic shock: Secondary | ICD-10-CM | POA: Diagnosis not present

## 2024-03-18 LAB — CBC
HCT: 32.2 % — ABNORMAL LOW (ref 39.0–52.0)
Hemoglobin: 10.9 g/dL — ABNORMAL LOW (ref 13.0–17.0)
MCH: 32.4 pg (ref 26.0–34.0)
MCHC: 33.9 g/dL (ref 30.0–36.0)
MCV: 95.8 fL (ref 80.0–100.0)
Platelets: 288 10*3/uL (ref 150–400)
RBC: 3.36 MIL/uL — ABNORMAL LOW (ref 4.22–5.81)
RDW: 13.4 % (ref 11.5–15.5)
WBC: 8.1 10*3/uL (ref 4.0–10.5)
nRBC: 0 % (ref 0.0–0.2)

## 2024-03-18 LAB — BASIC METABOLIC PANEL WITH GFR
Anion gap: 8 (ref 5–15)
BUN: 44 mg/dL — ABNORMAL HIGH (ref 8–23)
CO2: 26 mmol/L (ref 22–32)
Calcium: 8 mg/dL — ABNORMAL LOW (ref 8.9–10.3)
Chloride: 102 mmol/L (ref 98–111)
Creatinine, Ser: 1.12 mg/dL (ref 0.61–1.24)
GFR, Estimated: >60 mL/min (ref >60)
Glucose, Bld: 154 mg/dL — ABNORMAL HIGH (ref 70–99)
Potassium: 4.7 mmol/L (ref 3.5–5.1)
Sodium: 136 mmol/L (ref 135–145)

## 2024-03-18 LAB — GLUCOSE, CAPILLARY: Glucose-Capillary: 181 mg/dL — ABNORMAL HIGH (ref 70–99)

## 2024-03-18 MED ORDER — ORAL CARE MOUTH RINSE
15.0000 mL | OROMUCOSAL | Status: DC | PRN
  Administered 2024-03-26 – 2024-03-27 (×3): 15 mL via OROMUCOSAL

## 2024-03-18 MED ORDER — BOOST / RESOURCE BREEZE PO LIQD CUSTOM
1.0000 | Freq: Three times a day (TID) | ORAL | Status: DC
  Administered 2024-03-18: 1 via ORAL

## 2024-03-18 MED ORDER — ACETAMINOPHEN 500 MG PO TABS
1000.0000 mg | ORAL_TABLET | Freq: Four times a day (QID) | ORAL | Status: DC
  Administered 2024-03-18 – 2024-03-24 (×17): 1000 mg via ORAL
  Filled 2024-03-18 (×21): qty 2

## 2024-03-18 MED ORDER — SODIUM CHLORIDE 0.9% FLUSH
10.0000 mL | INTRAVENOUS | Status: DC | PRN
  Administered 2024-03-25 – 2024-04-02 (×4): 10 mL

## 2024-03-18 MED ORDER — FAT EMUL FISH OIL/PLANT BASED 20% (SMOFLIPID)IV EMUL
250.0000 mL | INTRAVENOUS | Status: AC
  Administered 2024-03-18: 250 mL via INTRAVENOUS
  Filled 2024-03-18: qty 250

## 2024-03-18 MED ORDER — INSULIN ASPART 100 UNIT/ML IJ SOLN
0.0000 [IU] | Freq: Four times a day (QID) | INTRAMUSCULAR | Status: DC
  Administered 2024-03-18: 2 [IU] via SUBCUTANEOUS
  Administered 2024-03-19 (×2): 1 [IU] via SUBCUTANEOUS
  Administered 2024-03-19 – 2024-03-20 (×4): 2 [IU] via SUBCUTANEOUS
  Administered 2024-03-21 (×3): 1 [IU] via SUBCUTANEOUS
  Administered 2024-03-21: 3 [IU] via SUBCUTANEOUS
  Administered 2024-03-22 – 2024-03-23 (×3): 1 [IU] via SUBCUTANEOUS
  Administered 2024-03-23: 2 [IU] via SUBCUTANEOUS
  Administered 2024-03-23 (×2): 1 [IU] via SUBCUTANEOUS
  Administered 2024-03-24: 2 [IU] via SUBCUTANEOUS
  Administered 2024-03-24 – 2024-03-28 (×8): 1 [IU] via SUBCUTANEOUS
  Administered 2024-03-28: 2 [IU] via SUBCUTANEOUS
  Administered 2024-03-28: 1 [IU] via SUBCUTANEOUS
  Administered 2024-03-28 – 2024-03-30 (×5): 2 [IU] via SUBCUTANEOUS
  Administered 2024-03-30: 1 [IU] via SUBCUTANEOUS
  Administered 2024-03-30: 2 [IU] via SUBCUTANEOUS
  Administered 2024-03-30 – 2024-03-31 (×5): 1 [IU] via SUBCUTANEOUS
  Administered 2024-04-01 (×2): 2 [IU] via SUBCUTANEOUS
  Administered 2024-04-01 – 2024-04-02 (×4): 1 [IU] via SUBCUTANEOUS
  Administered 2024-04-02: 2 [IU] via SUBCUTANEOUS
  Administered 2024-04-03 – 2024-04-06 (×12): 1 [IU] via SUBCUTANEOUS
  Administered 2024-04-06: 2 [IU] via SUBCUTANEOUS
  Administered 2024-04-06 – 2024-04-07 (×5): 1 [IU] via SUBCUTANEOUS
  Filled 2024-03-18 (×60): qty 1

## 2024-03-18 MED ORDER — FUROSEMIDE 10 MG/ML IJ SOLN
40.0000 mg | Freq: Once | INTRAMUSCULAR | Status: AC
  Administered 2024-03-18: 40 mg via INTRAVENOUS
  Filled 2024-03-18: qty 4

## 2024-03-18 MED ORDER — ALBUMIN HUMAN 25 % IV SOLN
25.0000 g | Freq: Once | INTRAVENOUS | Status: AC
  Administered 2024-03-18: 25 g via INTRAVENOUS
  Filled 2024-03-18: qty 100

## 2024-03-18 MED ORDER — ENSURE ENLIVE PO LIQD: 237.0000 mL | Freq: Two times a day (BID) | ORAL | Status: DC

## 2024-03-18 MED ORDER — THIAMINE HCL 100 MG/ML IJ SOLN
INTRAMUSCULAR | Status: AC
  Filled 2024-03-18: qty 1000

## 2024-03-18 MED ORDER — SODIUM CHLORIDE 0.9% FLUSH
10.0000 mL | Freq: Two times a day (BID) | INTRAVENOUS | Status: DC
  Administered 2024-03-18 – 2024-03-21 (×5): 10 mL
  Administered 2024-03-21: 20 mL
  Administered 2024-03-22 – 2024-03-27 (×10): 10 mL
  Administered 2024-03-28: 20 mL
  Administered 2024-03-28 – 2024-03-31 (×6): 10 mL
  Administered 2024-03-31: 20 mL
  Administered 2024-04-01 – 2024-04-07 (×10): 10 mL
  Administered 2024-04-08: 20 mL
  Administered 2024-04-08 – 2024-04-09 (×2): 10 mL

## 2024-03-18 NOTE — Progress Notes (Signed)
 PROGRESS NOTE    Todd Rojas  ZOX:[id number] DOB: 09-01-1941 DOA: 03/09/2024 PCP: Barbette Reichmann, MD  IC10A/IC10A-AA  LOS: 9 days   Brief hospital course:   Assessment & Plan: Todd Rojas is an 83 year old male with history of CKD stage IIIa, chronic gout, hypertension, hyperlipidemia, interstitial pulmonary disease, bronchiectasis, COPD, who presents emergency department for chief concerns of fever, chills, shortness of breath for 2 weeks.   Vitals in the ED showed temperature of 98.4, respiration rate 25, heart rate 94, blood pressure initially 96/69 and improved to 107/66, SpO2 initially desatted to 88% on room air   Gaseous abdominal distention 2/2 Colonic ileus  Ogilvie's syndrome Patient developed abdominal distention and crampy abdominal pain after presentation. Starting 3/25, Imaging with right colonic gaseous distention without any obvious obstruction.  Colonic distention has been worsening. --GenSurg and GI consulted, dx with Ogilvie's syndrome --neostigmine 2 mg push morning of 3/29 with [name] and [name] present with passing of gas and BM afterwards, however, colonic distention unchanged, so pt underwent endoscopic decompression on 3/30. Plan: --full liquid --start TPN --monitor for abdominal distention  Bradycardia  2/2 neostigmine administration.  S/p atropine. --tele monitor  * Severe sepsis with acute organ dysfunction (HCC) 2/2 PNA Elevated lactic acid of 2.6, increased respiration rate, with fever of 102 F at home, organ involvement are cardiac and pulmonary with hypoxic respiratory failure at 88% on room air, elevated high sensitive troponin of 98 and elevated BNP --Imaging concerning for right lower lobe pneumonia, although not much upper respiratory symptoms.  Procalcitonin at 0.352. Respiratory panels were negative. UA does not consistent with UTI Patient received broad-spectrum antibiotics -Completed 5 days of cefepime and  Zithromax-switching to Levaquin for 2 more days -Patient will need a repeat CT chest in 3 to 4 weeks to see the resolution of current abnormalities   Acute hypoxic respiratory failure (HCC) Likely with pneumonia Currently on 2L --Continue supplemental O2 to keep sats >=92%, wean as tolerated --trial IV lasix 40 today  Fever Afebrile now. Patient had 1 set of blood cultures done on 3/19 03/2024: Which was read as no growth, 5 days Preliminary repeat blood cultures negative in 4 days Concern of right lower lobe pneumonia. -Supportive care and antibiotics as listed above   Elevated troponin 2/2 demand ischemia First high sensitivity troponin was elevated at 98 >>101>175>226,  Suspect secondary to demand ischemia in setting of severe sepsis with acute hypoxic respiratory failure Echocardiogram with EF of 40% and regional wall motion abnormalities, discussed with cardiology and they think these abnormalities are chronic. Patient was also experiencing intermittent chest pain over the past couple of months which seems more exertional --cardio consulted, no plan for cardiac workup   CAD (coronary artery disease) With history of anterior STEMI in 2006, transferred to [facility] status post cardiac catheterization with PCI to mid LAD using Cypher stent; currently no longer on Plavix Complete echo on 05/17/2022: Estimated ejection fraction is 55%, trivial mitral regurgitation, trivial tricuspid regurgitation, trivial pulmonary valve regurgitation; aortic valve has normal variant --cont home ASA and statin   COPD (chronic obstructive pulmonary disease) (HCC) --cont home bronchodilator   Elevated serum creatinine Does not meet acute kidney injury, suspect prerenal in setting of nausea vomiting, creatinine with some improvement to 1.36 today Serum creatinine baseline is 1.35-1.36/eGFR 52-55, consistent with CKD stage III A -Monitor renal function -Avoid nephrotoxins   Hypertension, not currently  active Blood pressure borderline soft Home furosemide and lisinopril 20 mg daily were not resumed  on admission in setting of increase in sCr level from baseline -Continue holding home antihypertensives   Hyperlipidemia, unspecified --cont home statin   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 Imaging was obtained which shows very mild right knee effusion and no effusion on left. --Voltaren gel and heat pad   Thyroid nodule greater than or equal to 1.5 cm in diameter incidentally noted on imaging study Incidental finding of a 2.7 cm thyroid nodule. -Thyroid ultrasound with solitary nodule which required outpatient biopsy --TSH wnl   Personal history of gout --cont home allopurinol  Rash Developed this morning, all over chest, back and bilateral thighs.  Appeared to be allergic rxn. --cont prednisone   DVT prophylaxis: Lovenox SQ Code Status: DNR  Family Communication: wife updated at bedside today Level of care: Med-Surg Dispo:   The patient is from: home Anticipated d/c is to: SNF rehab Anticipated d/c date is: to be determined   Subjective and Interval History:  Less abdominal pain.     Objective: Vitals:   03/18/24 1200 03/18/24 1300 03/18/24 1400 03/18/24 1500  BP: 126/72 139/74 137/72 136/69  Pulse: (!) 58 (!) 53 62 (!) 59  Resp: 16  11 16   Temp:      TempSrc:      SpO2: 93% 94% 97% 97%  Weight:      Height:        Intake/Output Summary (Last 24 hours) at 03/18/2024 1803 Last data filed at 03/18/2024 1300 Gross per 24 hour  Intake 1132.34 ml  Output 67 ml  Net 1065.34 ml   Filed Weights   03/15/24 1910 03/17/24 0400 03/17/24 0756  Weight: 97.2 kg 83.5 kg 88.7 kg    Examination:   Constitutional: NAD, AAOx3 HEENT: conjunctivae and lids normal, EOMI CV: No cyanosis.   RESP: normal respiratory effort, on [place] Abdomen: some distention but softer Extremities: some edema in BLE SKIN: warm, dry Rectal tube present with  liquid output   Data Reviewed: I have personally reviewed labs and imaging studies  Time spent: 35 minutes  Darlin Priestly, MD [facility] If 7PM-7AM, please contact night-coverage 03/18/2024, 6:03 PM

## 2024-03-18 NOTE — Progress Notes (Signed)
 Peripherally Inserted Central Catheter Placement  The IV Nurse has discussed with the patient and/or persons authorized to consent for the patient, the purpose of this procedure and the potential benefits and risks involved with this procedure.  The benefits include less needle sticks, lab draws from the catheter, and the patient may be discharged home with the catheter. Risks include, but not limited to, infection, bleeding, blood clot (thrombus formation), and puncture of an artery; nerve damage and irregular heartbeat and possibility to perform a PICC exchange if needed/ordered by physician.  Alternatives to this procedure were also discussed.  Bard Power PICC patient education guide, fact sheet on infection prevention and patient information card has been provided to patient /or left at bedside.    PICC Placement Documentation  PICC Double Lumen 03/18/24 Right Brachial 39 cm 0 cm (Active)  Indication for Insertion or Continuance of Line Administration of hyperosmolar/irritating solutions (i.e. TPN, Vancomycin, etc.) 03/18/24 1616  Exposed Catheter (cm) 0 cm 03/18/24 1616  Site Assessment Clean, Dry, Intact 03/18/24 1616  Lumen #1 Status Flushed;Saline locked;Blood return noted 03/18/24 1616  Lumen #2 Status Saline locked;Blood return noted;Flushed 03/18/24 1616  Dressing Type Transparent;Securing device 03/18/24 1616  Dressing Status Antimicrobial disc/dressing in place 03/18/24 1616  Line Care Connections checked and tightened 03/18/24 1616  Line Adjustment (NICU/IV Team Only) No 03/18/24 1616  Dressing Intervention Adhesive placed at insertion site (IV team only) 03/18/24 1616  Dressing Change Due 03/25/24 03/18/24 1616       Romie Jumper 03/18/2024, 4:18 PM

## 2024-03-18 NOTE — Progress Notes (Signed)
 [facility] SURGICAL PROGRESS NOTE (cpt [phone])  Hospital Day(s): 9.   Interval History: Patient seen and examined, no acute events or new complaints overnight. Patient reports he is feeling better. Sleeping in chair this AM. Abdominal distension is improved. No fever, chills, nausea, emesis. Remains without leukocytosis; WBC 8.1K. Hgb to 10.9. Renal function normalized; sCr - 1.12; OU - 1075 ccs. No electrolyte derangements. No new imaging. Decompressive sigmoidoscopy yesterday (03/30); KUB afterwards with significant improvement. He is having bowel function; rectal tube in place.   Review of Systems:  Constitutional: denies fever, chills  HEENT: denies cough or congestion  Respiratory: denies any shortness of breath  Cardiovascular: denies chest pain or palpitations  Gastrointestinal: + distension (improving), denied abdominal pain, N/V Genitourinary: denies burning with urination or urinary frequency Musculoskeletal: denies pain, decreased motor or sensation  Vital signs in last 24 hours: [min-max] current  Temp:  [97.7 F (36.5 C)-98.2 F (36.8 C)] 98 F (36.7 C) (03/31 0000) Pulse Rate:  [55-82] 55 (03/31 0600) Resp:  [12-29] 15 (03/31 0600) BP: (101-147)/(52-84) 133/69 (03/31 0600) SpO2:  [90 %-98 %] 95 % (03/31 0600) Weight:  [88.7 kg] 88.7 kg (03/30 0756)     Height: 5\' 9"  (175.3 cm) Weight: 88.7 kg BMI (Calculated): 28.86   Intake/Output last 2 shifts:  03/30 0701 - 03/31 0700 In: 1114.9 [P.O.:720; I.V.:364.9] Out: 1214 [Urine:1075; Stool:139]   Physical Exam:  Constitutional: alert, cooperative and no distress  HENT: normocephalic without obvious abnormality  Eyes: PERRL, EOM's grossly intact and symmetric  Respiratory: breathing non-labored at rest  Cardiovascular: regular rate and sinus rhythm  Gastrointestinal: soft, non-tender, distended but improved, still tympanic, no rebound/guarding Musculoskeletal: no edema or wounds, motor and sensation  grossly intact, NT    Labs:     Latest Ref Rng & Units 03/18/2024    4:57 AM 03/17/2024    3:18 AM 03/16/2024    5:47 AM  CBC  WBC 4.0 - 10.5 K/uL 8.1  10.5  9.0   Hemoglobin 13.0 - 17.0 g/dL 86.5  78.4  69.6   Hematocrit 39.0 - 52.0 % 32.2  33.3  34.8   Platelets 150 - 400 K/uL 288  287  293       Latest Ref Rng & Units 03/18/2024    4:57 AM 03/17/2024    3:18 AM 03/16/2024    5:47 AM  CMP  Glucose 70 - 99 mg/dL 295  284  132   BUN 8 - 23 mg/dL 44  35  34   Creatinine 0.61 - 1.24 mg/dL 4.40  1.02  7.25   Sodium 135 - 145 mmol/L 136  136  132   Potassium 3.5 - 5.1 mmol/L 4.7  4.1  4.6   Chloride 98 - 111 mmol/L 102  101  98   CO2 22 - 32 mmol/L 26  25  26    Calcium 8.9 - 10.3 mg/dL 8.0  8.0  8.1   Total Protein 6.5 - 8.1 g/dL   6.5   Total Bilirubin 0.0 - 1.2 mg/dL   0.6   Alkaline Phos 38 - 126 U/L   38   AST 15 - 41 U/L   21   ALT 0 - 44 U/L   20      Imaging studies: No new pertinent imaging studies   Assessment/Plan: 83 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 Marland Kitchen   - Appreciate  GI assistance, reviewed decompressive sigmoidoscopy   - No indication for emergent surgical intervention; this of course would be last resort and likely involve subtotal colectomy and need ileostomy.   - I think we can potentially do FLD later today  - Consider removal of rectal tube as improves  - Monitor abdominal examination  - Serial KUB as needed  - Pain control prn; minimize narcotics as feasible - Antiemetics prn - Needs to mobilize    - Further management per primary service; we will follow   All of the above findings and recommendations were discussed with the patient, and the medical team, and all of patient's questions were answered to his expressed satisfaction.  -- Lynden Oxford, PA-C [facility] 03/18/2024, 7:18 AM M-F: 7am - 4pm

## 2024-03-18 NOTE — Progress Notes (Signed)
 Initial Nutrition Assessment  DOCUMENTATION CODES:   Not applicable  INTERVENTION:   TPN per pharmacy  Recommend thiamine 100mg  IV daily x 5-7 days  Pt at high refeed risk; recommend monitor potassium, magnesium and phosphorus labs daily until stable  Daily weights   Ensure Enlive po BID, each supplement provides 350 kcal and 20 grams of protein.  NUTRITION DIAGNOSIS:   Inadequate oral intake related to altered GI function as evidenced by other (comment) (pt on NPO/clear liquid diet since admission).  GOAL:   Patient will meet greater than or equal to 90% of their needs  MONITOR:   PO intake, Supplement acceptance, Diet advancement, Labs, Weight trends, Skin, I & O's, Other (Comment) (TPN)  REASON FOR ASSESSMENT:   Malnutrition Screening Tool, Consult New TPN/TNA  ASSESSMENT:   83 y/o male with h/o CAD, COPD, HLD, gout, DDD, GERD, CHF, thyroid nodule, kidney stones, B12 deficiency and CKD III who is admitted with PNA, sepsis and Ogilvie syndrome s/p colonic decompression 3/30.  Met with pt and pt's wife in room today. Pt sitting up in the chair and is asking for food. Pt reports that he is feeling better today. Pt reports feeling less bloated. Pt is noted to have less abdominal distension today. KUB from 3/30 reporting no abnormal bowel dilatation. Pt reports good appetite today but states he is tired of broth. Pt advanced to a full liquid diet this afternoon. Pt is agreeable to drinking chocolate Ensure. RD discussed with pt the importance of adequate nutrition needed to preserve lean muscle. Pt has now been without adequate nutrition for > 7 days. Plan is for PICC line and TPN today; this was discussed with patient. Pt is at high refeed risk; will obtain phosphorus after PICC line placed. Per chart, pt appears to be down ~19lbs since admission and appears to be down ~5lbs from his UBW currently.   Spoke with Todd Rojas, patient's granddaughter via the phone to provide an update  on pt's nutritional status. Granddaughter is an RD at [employer]. Granddaughter updated on plan for PICC line, TPN and full liquid diet today.    Medications reviewed and include: allopurinol, aspirin, cyanocobalamin, lovenox, insulin, prednisone  Labs reviewed: K 4.7 wnl, BUN 44(H) Mg 2.5(H)- 3/30 Hgb 10.9(L), Hct 32.2(L)  NUTRITION - FOCUSED PHYSICAL EXAM:  Flowsheet Row Most Recent Value  Orbital Region No depletion  Upper Arm Region No depletion  Thoracic and Lumbar Region No depletion  Buccal Region No depletion  Temple Region No depletion  Clavicle Bone Region No depletion  Clavicle and Acromion Bone Region No depletion  Scapular Bone Region No depletion  Dorsal Hand No depletion  Patellar Region No depletion  Anterior Thigh Region No depletion  Posterior Calf Region No depletion  Edema (RD Assessment) Mild  Hair Reviewed  Eyes Reviewed  Mouth Reviewed  Skin Reviewed  Nails Reviewed   Diet Order:   Diet Order             Diet full liquid Room service appropriate? Yes; Fluid consistency: Thin  Diet effective now                  EDUCATION NEEDS:   Education needs have been addressed  Skin:  Skin Assessment: Reviewed RN Assessment  Last BM:  3/31- via rectal tube  Height:   Ht Readings from Last 1 Encounters:  03/15/24 5\' 9"  (1.753 m)    Weight:   Wt Readings from Last 1 Encounters:  03/17/24 88.7 kg  Ideal Body Weight:  72.7 kg  BMI:  Body mass index is 28.88 kg/m.  Estimated Nutritional Needs:   Kcal:  1900-2200kcal/day  Protein:  95-110g/day  Fluid:  1.9-2.2L/day  Todd Holiday MS, RD, LDN If unable to be reached, please send secure chat to "RD inpatient" available from 8:00a-4:00p daily

## 2024-03-18 NOTE — Progress Notes (Signed)
 Midge Minium, MD [facility]   [address], [address] [place], [place] [address] Phone: [phone] Fax : [phone]   Subjective: This patient is status post treatment of his Ogilvie syndrome with neostigmine and subsequent colonic decompression with a sigmoidoscopy.  The scope was advanced to the transverse colon and decompression was accomplished.  The patient's abdomen appears to be somewhat distended but only mild to moderate distention at this time   Objective: Vital signs in last 24 hours: Vitals:   03/18/24 0500 03/18/24 0600 03/18/24 0700 03/18/24 0800  BP: 135/67 133/69 124/71   Pulse: 61 (!) 55 (!) 54   Resp: 18 15 16    Temp:    98.5 F (36.9 C)  TempSrc:    Oral  SpO2: 95% 95% 96%   Weight:      Height:       Weight change: 5.2 kg  Intake/Output Summary (Last 24 hours) at 03/18/2024 1254 Last data filed at 03/18/2024 0800 Gross per 24 hour  Intake 870 ml  Output 579 ml  Net 291 ml     Exam: Heart:: Regular rate and rhythm or without murmur or extra heart sounds Lungs: normal and clear to auscultation and percussion Abdomen: Slightly distended with tympany and decreased bowel sounds.   Lab Results: @LABTEST2 @ Micro Results: Recent Results (from the past 240 hours)  Blood culture (routine x 2)     Status: None   Collection Time: 03/09/24 11:46 AM   Specimen: BLOOD  Result Value Ref Range Status   Specimen Description BLOOD LEFT ANTECUBITAL  Final   Special Requests   Final    BOTTLES DRAWN AEROBIC AND ANAEROBIC Blood Culture results may not be optimal due to an inadequate volume of blood received in culture bottles   Culture   Final    NO GROWTH 5 DAYS Performed at [facility], [address] [place], [place] [address]    Report Status 03/14/2024 FINAL  Final  Blood culture (routine x 2)     Status: None   Collection Time: 03/09/24 12:28 PM   Specimen: BLOOD  Result Value Ref Range Status   Specimen Description BLOOD BLOOD RIGHT HAND   Final   Special Requests   Final    BOTTLES DRAWN AEROBIC AND ANAEROBIC Blood Culture results may not be optimal due to an inadequate volume of blood received in culture bottles   Culture   Final    NO GROWTH 5 DAYS Performed at [facility], [address] [place], [place] [address]    Report Status 03/14/2024 FINAL  Final  Respiratory (~20 pathogens) panel by PCR     Status: None   Collection Time: 03/09/24  1:56 PM   Specimen: Nasopharyngeal Swab; Respiratory  Result Value Ref Range Status   Adenovirus NOT DETECTED NOT DETECTED Final   Coronavirus 229E NOT DETECTED NOT DETECTED Final    Comment: (NOTE) The Coronavirus on the Respiratory Panel, DOES NOT test for the novel  Coronavirus (2019 nCoV)    Coronavirus HKU1 NOT DETECTED NOT DETECTED Final   Coronavirus NL63 NOT DETECTED NOT DETECTED Final   Coronavirus OC43 NOT DETECTED NOT DETECTED Final   Metapneumovirus NOT DETECTED NOT DETECTED Final   Rhinovirus / Enterovirus NOT DETECTED NOT DETECTED Final   Influenza A NOT DETECTED NOT DETECTED Final   Influenza B NOT DETECTED NOT DETECTED Final   Parainfluenza Virus 1 NOT DETECTED NOT DETECTED Final   Parainfluenza Virus 2 NOT DETECTED NOT DETECTED Final  Parainfluenza Virus 3 NOT DETECTED NOT DETECTED Final   Parainfluenza Virus 4 NOT DETECTED NOT DETECTED Final   Respiratory Syncytial Virus NOT DETECTED NOT DETECTED Final   Bordetella pertussis NOT DETECTED NOT DETECTED Final   Bordetella Parapertussis NOT DETECTED NOT DETECTED Final   Chlamydophila pneumoniae NOT DETECTED NOT DETECTED Final   Mycoplasma pneumoniae NOT DETECTED NOT DETECTED Final    Comment: Performed at [facility], [address], [place], [place] [address]  MRSA Next Gen by PCR, Nasal     Status: None   Collection Time: 03/11/24  9:32 AM   Specimen: Nasal Mucosa; Nasal Swab  Result Value Ref Range Status   MRSA by PCR Next Gen NOT DETECTED NOT DETECTED Final    Comment: (NOTE) The  GeneXpert MRSA Assay (FDA approved for NASAL specimens only), is one component of a comprehensive MRSA colonization surveillance program. It is not intended to diagnose MRSA infection nor to guide or monitor treatment for MRSA infections. Test performance is not FDA approved in patients less than 34 years old. Performed at [facility], [address] [place], [place] [address]   MRSA Next Gen by PCR, Nasal     Status: None   Collection Time: 03/15/24  7:11 PM   Specimen: Nasal Mucosa; Nasal Swab  Result Value Ref Range Status   MRSA by PCR Next Gen NOT DETECTED NOT DETECTED Final    Comment: (NOTE) The GeneXpert MRSA Assay (FDA approved for NASAL specimens only), is one component of a comprehensive MRSA colonization surveillance program. It is not intended to diagnose MRSA infection nor to guide or monitor treatment for MRSA infections. Test performance is not FDA approved in patients less than 54 years old. Performed at [facility], [address] [place], [place] [address]    Studies/Results: [place] EKG SITE RITE Result Date: 03/18/2024 If [facility] image not attached, placement could not be confirmed due to current cardiac rhythm.  DG Abd 1 View Result Date: 03/17/2024 CLINICAL DATA:  Ogilvie syndrome. EXAM: ABDOMEN - 1 VIEW COMPARISON:  Same day. FINDINGS: Colonic dilatation noted on prior exam appears to have resolved. Residual contrast is noted in descending colon and rectum. IMPRESSION: No abnormal bowel dilatation is noted currently. Electronically Signed   By: Lupita Raider M.D.   On: 03/17/2024 13:42   DG ABD ACUTE 2+V W 1V CHEST Result Date: 03/17/2024 CLINICAL DATA:  Ogilvie syndrome. EXAM: DG ABDOMEN ACUTE WITH 1 VIEW CHEST COMPARISON:  03/16/2024 FINDINGS: Gaseous distension of the colon is again noted. This appears similar to the previous exam. Right lower quadrant: Measures 12 cm in diameter compared with the same previously. Enteric contrast  material is identified within the rectum. Stable cardiomediastinal contours. Low lung volumes with asymmetric elevation of the right hemidiaphragm. Bibasilar atelectasis noted. No airspace consolidation. IMPRESSION: 1. Persistent gaseous distension of the colon. Unchanged compared with the prior exam. 2. Low lung volumes with bibasilar atelectasis. Electronically Signed   By: Signa Kell M.D.   On: 03/17/2024 09:59   DG Abd 1 View Result Date: 03/16/2024 CLINICAL DATA:  Ogilvie syndrome EXAM: ABDOMEN - 1 VIEW COMPARISON:  03/15/2024 FINDINGS: Gaseous distension of the colon slightly improved since prior study. Maximum diameter of the cecum/ascending colon 12.9 cm compared to 15 cm previously. No small bowel dilatation. No organomegaly or free air. IMPRESSION: Moderate gaseous distention of the colon with some improvement since prior study. Electronically Signed   By: Charlett Nose M.D.  On: 03/16/2024 18:36   Medications: I have reviewed the patient's current medications. Scheduled Meds:  acetaminophen  1,000 mg Oral Q6H   allopurinol  300 mg Oral Daily   aspirin EC  81 mg Oral Daily   Chlorhexidine Gluconate Cloth  6 each Topical Daily   cyanocobalamin  1,000 mcg Oral Daily   diclofenac Sodium  2 g Topical TID   enoxaparin (LOVENOX) injection  45 mg Subcutaneous Q24H   feeding supplement  1 Container Oral TID BM   ketorolac  15 mg Intravenous Q6H   metoprolol succinate  25 mg Oral Daily   mometasone-formoterol  2 puff Inhalation BID   predniSONE  40 mg Oral Q breakfast   rosuvastatin  20 mg Oral QHS   sodium chloride flush  3-10 mL Intravenous Q12H   Continuous Infusions:  TPN (CLINIMIX-E) Adult     And   fat emul(SMOFlipid)     PRN Meds:.alum & mag hydroxide-simeth, dicyclomine, iohexol, nitroGLYCERIN, mouth rinse, polyvinyl alcohol, sodium chloride flush   Assessment: Principal Problem:   Severe sepsis with acute organ dysfunction (HCC) Active Problems:   CAD (coronary artery  disease)   COPD (chronic obstructive pulmonary disease) (HCC)   Dyslipidemia   Hyperlipidemia, unspecified   Hypertension   Personal history of gout   Primary osteoarthritis of both knees   Elevated serum creatinine   Elevated troponin   Fever   Thyroid nodule greater than or equal to 1.5 cm in diameter incidentally noted on imaging study   Acute hypoxic respiratory failure (HCC)   Gaseous abdominal distention   Ogilvie syndrome    Plan: This patient has Ogilvie syndrome with intervention including neostigmine and colonic decompression.  The patient appears to have reaccumulated some of the gas although it is reported that he had some passing of gas and bowel movements since yesterday.  The patient's electrolytes still seem to be uncorrected with his magnesium and calcium being abnormal.  The patient is supposed to be put on TPN whereupon these can be more easily adjusted.  I will continue to follow with you while we continue conservative management.   LOS: 9 days   Midge Minium, MD.FACG 03/18/2024, 12:54 PM Pager [phone] 7am-5pm  Check AMION for 5pm -7am coverage and on weekends

## 2024-03-18 NOTE — Progress Notes (Addendum)
 [facility] CLINIC CARDIOLOGY PROGRESS NOTE   Patient ID: Todd Rojas MRN: [id number] DOB/AGE: Feb 07, 1941 83 y.o.  Admit date: 03/09/2024 Referring Physician Dr. Nelson Chimes Primary Physician Barbette Reichmann, MD  Primary Cardiologist Ander Slade, [place] ([facility]) Reason for Consultation elevated troponin  HPI: Todd Rojas is a 83 y.o. male with a past medical history of CAD (2006 Anterior STEMI-LAD PCI, 2010 LAD PCI, 6/202 LCX, LAD and OM1 PCI), chronic HFrEF, HTN and dyslipidemia  who presented to the ED on 03/09/2024 for fever and SOB. Troponins checked and found to be mildly elevated, cardiology was consulted for further evaluation.   Interval History:  -Patient seen and examined this AM, resting comfortably in bedside chair. -Denies any SOB, CP. Abdominal pain improving. -BP and heart rate remain controlled.  Review of systems complete and found to be negative unless listed above    Vitals:   03/18/24 0500 03/18/24 0600 03/18/24 0700 03/18/24 0800  BP: 135/67 133/69 124/71   Pulse: 61 (!) 55 (!) 54   Resp: 18 15 16    Temp:    98.5 F (36.9 C)  TempSrc:    Oral  SpO2: 95% 95% 96%   Weight:      Height:         Intake/Output Summary (Last 24 hours) at 03/18/2024 4098 Last data filed at 03/18/2024 0800 Gross per 24 hour  Intake 1170 ml  Output 879 ml  Net 291 ml      PHYSICAL EXAM General: Well-appearing elderly male, well nourished, in no acute distress. HEENT: Normocephalic and atraumatic. Neck: No JVD.  Lungs: Normal respiratory effort on 4L [place]. Clear bilaterally to auscultation. No wheezes, crackles, rhonchi.  Heart: HRRR. Normal S1 and S2 without gallops or murmurs. Radial & DP pulses 2+ bilaterally. Abdomen: Distended appearing.  Msk: Normal strength and tone for age. Extremities: No clubbing, cyanosis.  Pedal edema. Neuro: Alert and oriented X 3. Psych: Mood appropriate, affect congruent.    LABS: Basic Metabolic Panel: Recent Labs     03/16/24 0547 03/17/24 0318 03/18/24 0457  NA 132* 136 136  K 4.6 4.1 4.7  CL 98 101 102  CO2 26 25 26   GLUCOSE 147* 116* 154*  BUN 34* 35* 44*  CREATININE 1.25* 1.09 1.12  CALCIUM 8.1* 8.0* 8.0*  MG 2.5* 2.5*  --    Liver Function Tests: Recent Labs    03/16/24 0547  AST 21  ALT 20  ALKPHOS 38  BILITOT 0.6  PROT 6.5  ALBUMIN 2.1*    No results for input(s): "LIPASE", "AMYLASE" in the last 72 hours. CBC: Recent Labs    03/17/24 0318 03/18/24 0457  WBC 10.5 8.1  HGB 11.1* 10.9*  HCT 33.3* 32.2*  MCV 98.5 95.8  PLT 287 288   Cardiac Enzymes: No results for input(s): "CKTOTAL", "CKMB", "CKMBINDEX", "TROPONINIHS" in the last 72 hours.  BNP: No results for input(s): "BNP" in the last 72 hours.  D-Dimer: No results for input(s): "DDIMER" in the last 72 hours. Hemoglobin A1C: No results for input(s): "HGBA1C" in the last 72 hours. Fasting Lipid Panel: No results for input(s): "CHOL", "HDL", "LDLCALC", "TRIG", "CHOLHDL", "LDLDIRECT" in the last 72 hours. Thyroid Function Tests: No results for input(s): "TSH", "T4TOTAL", "T3FREE", "THYROIDAB" in the last 72 hours.  Invalid input(s): "FREET3"  Anemia Panel: No results for input(s): "VITAMINB12", "FOLATE", "FERRITIN", "TIBC", "IRON", "RETICCTPCT" in the last 72 hours.  DG Abd 1 View Result Date: 03/17/2024 CLINICAL DATA:  Ogilvie syndrome. EXAM: ABDOMEN - 1  VIEW COMPARISON:  Same day. FINDINGS: Colonic dilatation noted on prior exam appears to have resolved. Residual contrast is noted in descending colon and rectum. IMPRESSION: No abnormal bowel dilatation is noted currently. Electronically Signed   By: Lupita Raider M.D.   On: 03/17/2024 13:42   DG ABD ACUTE 2+V W 1V CHEST Result Date: 03/17/2024 CLINICAL DATA:  Ogilvie syndrome. EXAM: DG ABDOMEN ACUTE WITH 1 VIEW CHEST COMPARISON:  03/16/2024 FINDINGS: Gaseous distension of the colon is again noted. This appears similar to the previous exam. Right lower  quadrant: Measures 12 cm in diameter compared with the same previously. Enteric contrast material is identified within the rectum. Stable cardiomediastinal contours. Low lung volumes with asymmetric elevation of the right hemidiaphragm. Bibasilar atelectasis noted. No airspace consolidation. IMPRESSION: 1. Persistent gaseous distension of the colon. Unchanged compared with the prior exam. 2. Low lung volumes with bibasilar atelectasis. Electronically Signed   By: Signa Kell M.D.   On: 03/17/2024 09:59   DG Abd 1 View Result Date: 03/16/2024 CLINICAL DATA:  Ogilvie syndrome EXAM: ABDOMEN - 1 VIEW COMPARISON:  03/15/2024 FINDINGS: Gaseous distension of the colon slightly improved since prior study. Maximum diameter of the cecum/ascending colon 12.9 cm compared to 15 cm previously. No small bowel dilatation. No organomegaly or free air. IMPRESSION: Moderate gaseous distention of the colon with some improvement since prior study. Electronically Signed   By: Charlett Nose M.D.   On: 03/16/2024 18:36      ECHO as above  TELEMETRY reviewed by me 03/18/24: sinus brady rate 50s  EKG reviewed by me 03/18/24: sinus tachycardia 1st degree AVB rate 110 bpm  DATA reviewed by me 03/18/24: last 24h vitals tele labs imaging I/O, hospitalist progress note, GI/gen surg notes  Principal Problem:   Severe sepsis with acute organ dysfunction (HCC) Active Problems:   CAD (coronary artery disease)   COPD (chronic obstructive pulmonary disease) (HCC)   Dyslipidemia   Hyperlipidemia, unspecified   Hypertension   Personal history of gout   Primary osteoarthritis of both knees   Elevated serum creatinine   Elevated troponin   Fever   Thyroid nodule greater than or equal to 1.5 cm in diameter incidentally noted on imaging study   Acute hypoxic respiratory failure (HCC)   Gaseous abdominal distention   Ogilvie syndrome    ASSESSMENT AND PLAN: Todd Rojas is a 83 y.o. male with a past medical  history of CAD (2006 Anterior STEMI-LAD PCI, 2010 LAD PCI, 6/202 LCX, LAD and OM1 PCI), chronic HFrEF, HTN and dyslipidemia  who presented to the ED on 03/09/2024 for fever and SOB. Troponins checked and found to be mildly elevated, cardiology was consulted for further evaluation.   # Demand ischemia # Coronary artery disease # Severe sepsis Patient with hx of CAD presented to the ED with SOB and fevers, found to be septic and started on IV antibiotics. Troponins checked and found to be elevated, reports pleuritic CP.  Echo this admission with EF 40%, regional wall motion abnormalities similar to prior echoes. -Given other current issues and no complaints of chest pain will defer any further cardiac diagnostic workup at this time. -Continue metoprolol succinate 25 mg daily. Home BP meds held given borderline BP and elevated Cr.   -Suspect LE edema 2/2 dependency. Would defer further diuretic dosing. -Continue rosuvastatin 20 mg daily and aspirin 81 mg daily.  -Further management per primary team.   # Ogilvie's syndrome With distention and abd pain late last week.  S/p 1 dose of neostigmine complicated by severe bradycardia now improved. S/p decompressive sigmoidoscopy yesterday.  -Appreciate GI & gen surg input.   Cardiology will sign off. Please haiku with questions or re-engage if needed.    This patient's case was discussed and created with Dr. Darrold Junker and he is in agreement.  Signed:  Gale Journey, PA-C  03/18/2024, 9:18 AM [facility] Cardiology

## 2024-03-18 NOTE — Progress Notes (Signed)
 Occupational Therapy Re-Evaluation Patient Details Name: Dimetrius Montfort MRN: [id number] DOB: January 28, 1941 Today's Date: 03/18/2024   History of present illness Pt is an 83 year old male   MD work up including sepsis, acute hypoxic respiratory failure, PNA    Pmh significant for CKD stage IIIa, chronic gout, hypertension, hyperlipidemia, interstitial pulmonary disease, bronchiectasis, COPD   OT comments  Upon entering the room, pt seated in recliner chair and reports feeling much better today with no c/o pain during this session. Previously, pain has been a very limiting factor for pt. Pt stands with CGA and cues for hand placement with use of RW. Pt marches in place and then ambulates to bed with RW and supervision. Pt reports fatigue from sitting up for ~ 5 hours this morning. Pt needing min A for sit >supine for B LEs. Pt repositions self in bed with increased time and effort. Call bell and all needed items within reach upon exiting the room. Pt making excellent progress towards occupational therapy goals with goals being upgraded this session.       If plan is discharge home, recommend the following:  A little help with walking and/or transfers;A little help with bathing/dressing/bathroom;Assistance with cooking/housework;Assist for transportation;Help with stairs or ramp for entrance   Equipment Recommendations  BSC/3in1;Tub/shower seat       Precautions / Restrictions Precautions Precautions: Fall Recall of Precautions/Restrictions: Intact       Mobility Bed Mobility Overal bed mobility: Needs Assistance Bed Mobility: Sit to Supine       Sit to supine: Min assist   General bed mobility comments: for B LEs    Transfers Overall transfer level: Needs assistance Equipment used: Rolling walker (2 wheels) Transfers: Sit to/from Stand Sit to Stand: Contact guard assist                 Balance Overall balance assessment: Needs assistance Sitting-balance support:  Feet supported, Bilateral upper extremity supported Sitting balance-Leahy Scale: Good     Standing balance support: Bilateral upper extremity supported, Reliant on assistive device for balance Standing balance-Leahy Scale: Fair                             ADL either performed or assessed with clinical judgement   ADL Overall ADL's : Needs assistance/impaired                                       General ADL Comments: max A to don B socks    Extremity/Trunk Assessment Upper Extremity Assessment Upper Extremity Assessment: Generalized weakness   Lower Extremity Assessment Lower Extremity Assessment: Generalized weakness        Vision Patient Visual Report: No change from baseline               Cognition Arousal: Alert Behavior During Therapy: WFL for tasks assessed/performed Cognition: No apparent impairments                                                      Pertinent Vitals/ Pain       Pain Assessment Pain Assessment: No/denies pain         Frequency  Min 2X/week  Progress Toward Goals  OT Goals(current goals can now be found in the care plan section)  Progress towards OT goals: Progressing toward goals      AM-PAC OT "6 Clicks" Daily Activity     Outcome Measure   Help from another person eating meals?: None Help from another person taking care of personal grooming?: A Little Help from another person toileting, which includes using toliet, bedpan, or urinal?: A Lot Help from another person bathing (including washing, rinsing, drying)?: A Lot Help from another person to put on and taking off regular upper body clothing?: A Little Help from another person to put on and taking off regular lower body clothing?: A Lot 6 Click Score: 16    End of Session Equipment Utilized During Treatment: Rolling walker (2 wheels);Oxygen  OT Visit Diagnosis: Other abnormalities of gait and mobility  (R26.89)   Activity Tolerance Patient tolerated treatment well   Patient Left with call bell/phone within reach;in bed;with bed alarm set   Nurse Communication Mobility status        Time: 0347-4259 OT Time Calculation (min): 19 min  Charges: OT General Charges $OT Visit: 1 Visit OT Evaluation $OT Re-eval: 1 Re-eval OT Treatments $Therapeutic Activity: 8-22 mins  Jackquline Denmark, MS, OTR/L , CBIS ascom [phone]  03/18/24, 2:53 PM

## 2024-03-18 NOTE — Progress Notes (Signed)
 PHARMACY - TOTAL PARENTERAL NUTRITION CONSULT NOTE   Indication: Ogilvie's syndrome  Patient Measurements: Height: 5\' 9"  (175.3 cm) Weight: 88.7 kg (195 lb 8.8 oz) IBW/kg (Calculated) : 70.7 TPN AdjBW (KG): 77.3 Body mass index is 28.88 kg/m.  Assessment: 83 y.o. 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   Glucose / Insulin: not on insulin Electrolytes: WNL Renal: SCr 1.5 > 1.12 Hepatic: LFTs, bilirubin wnl Intake / Output; 03/30 0701 - 03/31 0700 In: 1114.9 [P.O.:720; I.V.:364.9] Out: 1214 [Urine:1075; Stool:139]  MIVF: none GI Imaging:Serial KUB prn --No new pertinent imaging studies  GI Surgeries / Procedures: none recent  Central access: 03/18/24 (pending) TPN start date: 03/18/24   Current Nutrition:  Full liquids  Plan:  ---Start Clinimix E8/10TPN at 34mL/hr + SMOF lipids 250 mL over 12 hours at 1800 ---Electrolytes in TPN: standard E8/10 ---Add standard MVI, thiamine 100 mg and trace elements to TPN ---Initiate Sensitive q6h SSI and adjust as needed  ---Monitor TPN labs on Mon/Thurs, daily until stable  Lowella Bandy 03/18/2024,11:23 AM

## 2024-03-18 NOTE — Progress Notes (Signed)
 RUA PICC placed and ready to use. EKG Site Rite printed and attached to chart.

## 2024-03-18 NOTE — Progress Notes (Signed)
 Physical Therapy Re-Evaluation Patient Details Name: Todd Rojas MRN: [id number] DOB: 07/03/1941 Today's Date: 03/18/2024   History of Present Illness Pt is an 83 year old male   MD work up including sepsis, acute hypoxic respiratory failure, PNA    Pmh significant for CKD stage IIIa, chronic gout, hypertension, hyperlipidemia, interstitial pulmonary disease, bronchiectasis, COPD.    PT Comments  Patient alert, seated in recliner, family at bedside. Pt reported no pain this session, and greatly increased activity tolerance. Sit <> stand with RW and CGA, and able to ambulate >170ft today, several standing rest breaks for pt fatigue, close chair follow. Pt goals updated as needed. The patient would benefit from further skilled PT intervention to continue to progress towards goals.    If plan is discharge home, recommend the following: A lot of help with walking and/or transfers;A lot of help with bathing/dressing/bathroom;Assistance with cooking/housework;Assist for transportation;Help with stairs or ramp for entrance;Direct supervision/assist for medications management   Can travel by private vehicle     Yes  Equipment Recommendations  Rolling walker (2 wheels);BSC/3in1    Recommendations for Other Services       Precautions / Restrictions Precautions Precautions: Fall Recall of Precautions/Restrictions: Intact Restrictions Weight Bearing Restrictions Per Provider Order: No     Mobility  Bed Mobility               General bed mobility comments: NT in chair pre/post session    Transfers Overall transfer level: Needs assistance Equipment used: Rolling walker (2 wheels) Transfers: Sit to/from Stand Sit to Stand: Contact guard assist                Ambulation/Gait Ambulation/Gait assistance: Contact guard assist, +2 safety/equipment (chair follow) Gait Distance (Feet): 200 Feet Assistive device: Rolling walker (2 wheels) Gait Pattern/deviations:  Step-through pattern, Decreased step length - right, Decreased step length - left           Stairs             Wheelchair Mobility     Tilt Bed    Modified Rankin (Stroke Patients Only)       Balance Overall balance assessment: Needs assistance Sitting-balance support: Feet supported, Bilateral upper extremity supported Sitting balance-Leahy Scale: Fair     Standing balance support: Bilateral upper extremity supported, Reliant on assistive device for balance Standing balance-Leahy Scale: Fair                              Communication    Cognition Arousal: Alert Behavior During Therapy: WFL for tasks assessed/performed   PT - Cognitive impairments: No apparent impairments                         Following commands: Intact      Cueing    Exercises      General Comments        Pertinent Vitals/Pain Pain Assessment Pain Assessment: No/denies pain    Home Living                          Prior Function            PT Goals (current goals can now be found in the care plan section) Progress towards PT goals: Progressing toward goals    Frequency    Min 2X/week      PT Plan  Co-evaluation              AM-PAC PT "6 Clicks" Mobility   Outcome Measure  Help needed turning from your back to your side while in a flat bed without using bedrails?: A Little Help needed moving from lying on your back to sitting on the side of a flat bed without using bedrails?: A Little Help needed moving to and from a bed to a chair (including a wheelchair)?: A Little Help needed standing up from a chair using your arms (e.g., wheelchair or bedside chair)?: A Little Help needed to walk in hospital room?: A Little Help needed climbing 3-5 steps with a railing? : A Little 6 Click Score: 18    End of Session Equipment Utilized During Treatment: Gait belt Activity Tolerance: Patient tolerated treatment well Patient  left: in chair;with call bell/phone within reach;with family/visitor present Nurse Communication: Mobility status PT Visit Diagnosis: Other abnormalities of gait and mobility (R26.89);Difficulty in walking, not elsewhere classified (R26.2);Muscle weakness (generalized) (M62.81)     Time: 1308-6578 PT Time Calculation (min) (ACUTE ONLY): 17 min  Charges:    $Therapeutic Activity: 8-22 mins PT General Charges $$ ACUTE PT VISIT: 1 Visit                     Olga Coaster PT, DPT 1:12 PM,03/18/24

## 2024-03-19 ENCOUNTER — Inpatient Hospital Stay

## 2024-03-19 DIAGNOSIS — J9819 Other pulmonary collapse: Secondary | ICD-10-CM

## 2024-03-19 DIAGNOSIS — A419 Sepsis, unspecified organism: Secondary | ICD-10-CM | POA: Diagnosis not present

## 2024-03-19 DIAGNOSIS — R6520 Severe sepsis without septic shock: Secondary | ICD-10-CM | POA: Diagnosis not present

## 2024-03-19 DIAGNOSIS — I2699 Other pulmonary embolism without acute cor pulmonale: Secondary | ICD-10-CM

## 2024-03-19 HISTORY — DX: Other pulmonary embolism without acute cor pulmonale: I26.99

## 2024-03-19 HISTORY — PX: CREATION, ILEOSTOMY, DIVERTING, ROBOT-ASSISTED: SHX7172

## 2024-03-19 HISTORY — DX: Other pulmonary collapse: J98.19

## 2024-03-19 LAB — RENAL FUNCTION PANEL
Albumin: 2.2 g/dL — ABNORMAL LOW (ref 3.5–5.0)
Anion gap: 7 (ref 5–15)
BUN: 46 mg/dL — ABNORMAL HIGH (ref 8–23)
CO2: 29 mmol/L (ref 22–32)
Calcium: 8.2 mg/dL — ABNORMAL LOW (ref 8.9–10.3)
Chloride: 102 mmol/L (ref 98–111)
Creatinine, Ser: 1.25 mg/dL — ABNORMAL HIGH (ref 0.61–1.24)
GFR, Estimated: 57 mL/min — ABNORMAL LOW (ref >60)
Glucose, Bld: 132 mg/dL — ABNORMAL HIGH (ref 70–99)
Phosphorus: 4.5 mg/dL (ref 2.5–4.6)
Potassium: 4.4 mmol/L (ref 3.5–5.1)
Sodium: 138 mmol/L (ref 135–145)

## 2024-03-19 LAB — GLUCOSE, CAPILLARY
Glucose-Capillary: 159 mg/dL — ABNORMAL HIGH (ref 70–99)
Glucose-Capillary: 137 mg/dL — ABNORMAL HIGH (ref 70–99)
Glucose-Capillary: 140 mg/dL — ABNORMAL HIGH (ref 70–99)
Glucose-Capillary: 173 mg/dL — ABNORMAL HIGH (ref 70–99)

## 2024-03-19 LAB — MAGNESIUM: Magnesium: 2.7 mg/dL — ABNORMAL HIGH (ref 1.7–2.4)

## 2024-03-19 MED ORDER — FAT EMUL FISH OIL/PLANT BASED 20% (SMOFLIPID)IV EMUL
250.0000 mL | INTRAVENOUS | Status: AC
  Administered 2024-03-19: 250 mL via INTRAVENOUS
  Filled 2024-03-19: qty 250

## 2024-03-19 MED ORDER — TRACE MINERALS CU-MN-SE-ZN 300-55-60-3000 MCG/ML IV SOLN
INTRAVENOUS | Status: AC
  Filled 2024-03-19: qty 1000

## 2024-03-19 MED ORDER — BOOST / RESOURCE BREEZE PO LIQD CUSTOM
1.0000 | Freq: Three times a day (TID) | ORAL | Status: DC
  Administered 2024-03-19 – 2024-03-21 (×3): 1 via ORAL

## 2024-03-19 MED ORDER — SODIUM CHLORIDE 0.9 % IV SOLN
2.0000 g | Freq: Once | INTRAVENOUS | Status: AC
  Administered 2024-03-20: 2 g via INTRAVENOUS
  Filled 2024-03-19: qty 2

## 2024-03-19 MED ORDER — ONDANSETRON 4 MG PO TBDP: 4.0000 mg | ORAL_TABLET | Freq: Three times a day (TID) | ORAL | Status: DC | PRN

## 2024-03-19 MED ORDER — THIAMINE MONONITRATE 100 MG PO TABS
100.0000 mg | ORAL_TABLET | Freq: Every day | ORAL | Status: AC
  Administered 2024-03-20 – 2024-03-24 (×5): 100 mg via ORAL
  Filled 2024-03-19 (×5): qty 1

## 2024-03-19 MED ORDER — THIAMINE HCL 100 MG/ML IJ SOLN: 100.0000 mg | Freq: Every day | INTRAMUSCULAR | Status: AC

## 2024-03-19 MED ORDER — ONDANSETRON HCL 4 MG/2ML IJ SOLN
4.0000 mg | Freq: Four times a day (QID) | INTRAMUSCULAR | Status: DC | PRN
  Administered 2024-03-19 – 2024-04-03 (×17): 4 mg via INTRAVENOUS
  Filled 2024-03-19 (×15): qty 2

## 2024-03-19 MED ORDER — ONDANSETRON HCL 4 MG/2ML IJ SOLN
INTRAMUSCULAR | Status: AC
  Administered 2024-03-19: 4 mg via INTRAVENOUS
  Filled 2024-03-19: qty 2

## 2024-03-19 NOTE — Progress Notes (Signed)
 Patient has failed conservative therapy with neostigmine and decompression and is supposed to go for surgery tomorrow.  Nothing further to do from a GI point of view  I will sign off.  Please call if any further GI concerns or questions.  We would like to thank you for the opportunity to participate in the care of Todd Rojas.

## 2024-03-19 NOTE — Progress Notes (Signed)
 Physical Therapy Treatment Patient Details Name: Todd Rojas MRN: [id number] DOB: 11-30-1941 Today's Date: 03/19/2024   History of Present Illness Pt is an 83 year old male   MD work up including sepsis, acute hypoxic respiratory failure, PNA    Pmh significant for CKD stage IIIa, chronic gout, hypertension, hyperlipidemia, interstitial pulmonary disease, bronchiectasis, COPD    PT Comments  Patient alert, agreeable to PT, denied pain but did demonstrate mild antalgic gait due to L knee, and some grimacing with mobility due to belly pain/distention. He was able to perform supine to sit, CGA with bed rails. Sit <> stand with minA for steadying and RW. Did endorse some light headedness that improved over time, on room air throughout (spO2 90-92%). He was able to ambulate ~274ft still, some fatigue noted but CGA. Pt up in chair with needs in reach at end of session. The patient would benefit from further skilled PT intervention to continue to progress towards goals.     If plan is discharge home, recommend the following: A lot of help with walking and/or transfers;A lot of help with bathing/dressing/bathroom;Assistance with cooking/housework;Assist for transportation;Help with stairs or ramp for entrance;Direct supervision/assist for medications management   Can travel by private vehicle     Yes  Equipment Recommendations  Rolling walker (2 wheels);BSC/3in1    Recommendations for Other Services       Precautions / Restrictions Precautions Precautions: Fall Recall of Precautions/Restrictions: Intact Restrictions Weight Bearing Restrictions Per Provider Order: No     Mobility  Bed Mobility Overal bed mobility: Needs Assistance Bed Mobility: Supine to Sit     Supine to sit: HOB elevated, Contact guard          Transfers Overall transfer level: Needs assistance Equipment used: Rolling walker (2 wheels) Transfers: Sit to/from Stand Sit to Stand: Min assist            General transfer comment: minA for steadying, RW stabilization    Ambulation/Gait Ambulation/Gait assistance: Contact guard assist Gait Distance (Feet): 220 Feet Assistive device: Rolling walker (2 wheels)   Gait velocity: dec         Stairs             Wheelchair Mobility     Tilt Bed    Modified Rankin (Stroke Patients Only)       Balance Overall balance assessment: Needs assistance Sitting-balance support: Feet supported, Bilateral upper extremity supported Sitting balance-Leahy Scale: Good     Standing balance support: Bilateral upper extremity supported, Reliant on assistive device for balance Standing balance-Leahy Scale: Fair                              Communication    Cognition Arousal: Alert Behavior During Therapy: WFL for tasks assessed/performed   PT - Cognitive impairments: No apparent impairments                         Following commands: Intact      Cueing    Exercises      General Comments        Pertinent Vitals/Pain Pain Assessment Pain Assessment: Faces Faces Pain Scale: Hurts a little bit Pain Location: stomach, B knees Pain Descriptors / Indicators: Grimacing, Guarding, Sore Pain Intervention(s): Limited activity within patient's tolerance, Monitored during session, Repositioned    Home Living  Prior Function            PT Goals (current goals can now be found in the care plan section) Progress towards PT goals: Progressing toward goals    Frequency    Min 2X/week      PT Plan      Co-evaluation              AM-PAC PT "6 Clicks" Mobility   Outcome Measure  Help needed turning from your back to your side while in a flat bed without using bedrails?: A Little Help needed moving from lying on your back to sitting on the side of a flat bed without using bedrails?: A Little Help needed moving to and from a bed to a chair (including a  wheelchair)?: A Little Help needed standing up from a chair using your arms (e.g., wheelchair or bedside chair)?: A Little Help needed to walk in hospital room?: A Little Help needed climbing 3-5 steps with a railing? : A Little 6 Click Score: 18    End of Session Equipment Utilized During Treatment: Gait belt Activity Tolerance: Patient tolerated treatment well Patient left: in chair;with call bell/phone within reach;with family/visitor present Nurse Communication: Mobility status PT Visit Diagnosis: Other abnormalities of gait and mobility (R26.89);Difficulty in walking, not elsewhere classified (R26.2);Muscle weakness (generalized) (M62.81)     Time: 1410-1434 PT Time Calculation (min) (ACUTE ONLY): 24 min  Charges:    $Therapeutic Activity: 8-22 mins PT General Charges $$ ACUTE PT VISIT: 1 Visit                     Olga Coaster PT, DPT 3:25 PM,03/19/24

## 2024-03-19 NOTE — Progress Notes (Signed)
 PROGRESS NOTE    Ziyon Soltau  QMV:[id number] DOB: [birth year] DOA: 03/09/2024 PCP: Barbette Reichmann, MD  [id number]  LOS: 10 days   Brief hospital course:   Assessment & Plan: Dewight Catino is an 83 year old male with history of CKD stage IIIa, chronic gout, hypertension, hyperlipidemia, interstitial pulmonary disease, bronchiectasis, COPD, who presents emergency department for chief concerns of fever, chills, shortness of breath for 2 weeks.   Vitals in the ED showed temperature of 98.4, respiration rate 25, heart rate 94, blood pressure initially 96/69 and improved to 107/66, SpO2 initially desatted to 88% on room air   Gaseous abdominal distention 2/2 Colonic ileus  Ogilvie's syndrome Patient developed abdominal distention and crampy abdominal pain after presentation. Starting 3/25, Imaging with right colonic gaseous distention without any obvious obstruction.  Colonic distention has been worsening. --GenSurg and GI consulted, dx with Ogilvie's syndrome --neostigmine 2 mg push morning of 3/29 with [name] and [name] present with passing of gas and BM afterwards, however, colonic distention unchanged, so pt underwent endoscopic decompression on 3/30.  Unfortunately colonic gas re-accumulated. --started TPN on 3/31 Plan: --plan for surgical intervention which would potentially involve subtotal colectomy and end ileostomy creation.  --clear liquid diet --cont TPN  Bradycardia  2/2 neostigmine administration, HR down to 20's.  S/p atropine.  * Severe sepsis with acute organ dysfunction (HCC) 2/2 PNA Elevated lactic acid of 2.6, increased respiration rate, with fever of 102 F at home, organ involvement are cardiac and pulmonary with hypoxic respiratory failure at 88% on room air, elevated high sensitive troponin of 98 and elevated BNP --Imaging concerning for right lower lobe pneumonia, although not much upper respiratory symptoms.  Procalcitonin at 0.352. Respiratory  panels were negative. UA does not consistent with UTI Patient received broad-spectrum antibiotics -Completed 5 days of cefepime and Zithromax-switching to Levaquin for 2 more days -Patient will need a repeat CT chest in 3 to 4 weeks to see the resolution of current abnormalities   Acute hypoxic respiratory failure (HCC) Likely with pneumonia Currently on 2L --Continue supplemental O2 to keep sats >=92%, wean as tolerated  Fever Afebrile now. Patient had 1 set of blood cultures done on 3/19 03/2024: Which was read as no growth, 5 days Preliminary repeat blood cultures negative in 4 days Concern of right lower lobe pneumonia. -Supportive care and antibiotics as listed above   Elevated troponin 2/2 demand ischemia First high sensitivity troponin was elevated at 98 >>101>175>226,  Suspect secondary to demand ischemia in setting of severe sepsis with acute hypoxic respiratory failure Echocardiogram with EF of 40% and regional wall motion abnormalities, discussed with cardiology and they think these abnormalities are chronic. Patient was also experiencing intermittent chest pain over the past couple of months which seems more exertional --cardio consulted, no plan for cardiac workup   CAD (coronary artery disease) With history of anterior STEMI in 2006, transferred to [facility] status post cardiac catheterization with PCI to mid LAD using Cypher stent; currently no longer on Plavix Complete echo on 05/17/2022: Estimated ejection fraction is 55%, trivial mitral regurgitation, trivial tricuspid regurgitation, trivial pulmonary valve regurgitation; aortic valve has normal variant --cont home ASA and statin --cont Toprol   COPD (chronic obstructive pulmonary disease) (HCC) --cont home bronchodilator   Elevated serum creatinine Does not meet acute kidney injury, suspect prerenal in setting of nausea vomiting, creatinine with some improvement to 1.36 today Serum creatinine baseline is 1.35-1.36/eGFR  52-55, consistent with CKD stage III A -Monitor renal function -Avoid nephrotoxins  Hypertension, not currently active Blood pressure borderline soft Home furosemide and lisinopril 20 mg daily were not resumed on admission in setting of increase in sCr level from baseline   Hyperlipidemia, unspecified --cont home statin   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 Imaging was obtained which shows very mild right knee effusion and no effusion on left. --Voltaren gel and heat pad   Thyroid nodule greater than or equal to 1.5 cm in diameter incidentally noted on imaging study Incidental finding of a 2.7 cm thyroid nodule. -Thyroid ultrasound with solitary nodule which required outpatient biopsy --TSH wnl   Personal history of gout --cont home allopurinol  Rash Developed morning of 3/28, all over chest, back and bilateral thighs.  Appeared to be allergic rxn.  Rash improved with prednisone.  Completed 5 days of prednisone 40 mg daily.   DVT prophylaxis: Lovenox SQ Code Status: DNR  Family Communication: wife and daughter updated at bedside today Level of care: Med-Surg Dispo:   The patient is from: home Anticipated d/c is to: SNF rehab Anticipated d/c date is: to be determined   Subjective and Interval History:  Pt not in significant pain, however, colonic gas continued to accumulate causing distention again.  Surgery discussed with family and pt, and is planning on taking pt for surgery tomorrow.   Objective: Vitals:   03/19/24 0326 03/19/24 0329 03/19/24 0845 03/19/24 1612  BP: 130/62  (!) 123/54 119/69  Pulse: (!) 59  (!) 56 (!) 50  Resp: 19  14 16   Temp: 97.7 F (36.5 C)  97.7 F (36.5 C) 97.8 F (36.6 C)  TempSrc: Oral  Oral Oral  SpO2: 94%  98% 96%  Weight:  96.7 kg    Height:        Intake/Output Summary (Last 24 hours) at 03/19/2024 1745 Last data filed at 03/19/2024 1057 Gross per 24 hour  Intake 407.11 ml  Output 1600 ml   Net -1192.89 ml   Filed Weights   03/17/24 0400 03/17/24 0756 03/19/24 0329  Weight: 83.5 kg 88.7 kg 96.7 kg    Examination:   Constitutional: NAD, alert HEENT: conjunctivae and lids normal, EOMI CV: No cyanosis.   RESP: normal respiratory effort, on RA Abdomen: distended SKIN: warm, dry, rash improved Rectal tube present    Data Reviewed: I have personally reviewed labs and imaging studies  Time spent: 35 minutes  Darlin Priestly, MD [facility] If 7PM-7AM, please contact night-coverage 03/19/2024, 5:45 PM

## 2024-03-19 NOTE — Progress Notes (Signed)
 [facility] SURGICAL PROGRESS NOTE (cpt [phone])  Hospital Day(s): 10.   Interval History: Patient seen and examined, no acute events or new complaints overnight. Patient reports he had a rough night; did not sleep. He does report nausea this AM. Abdomen remains distended, no significant pain. No fever, chills, emesis. Renal function at baseline; sCr - 1.25; UO - 1600 ccs. He is on FLD; tolerated yesterday but not hungry this AM. KUB this morning shows progression of cecal distension, stomach also distended.  He does have rectal tube; output not recorded, draining stool this AM.   Review of Systems:  Constitutional: denies fever, chills  HEENT: denies cough or congestion  Respiratory: denies any shortness of breath  Cardiovascular: denies chest pain or palpitations  Gastrointestinal: + distension, + nausea, denied abdominal pain, emesis Genitourinary: denies burning with urination or urinary frequency Musculoskeletal: denies pain, decreased motor or sensation  Vital signs in last 24 hours: [min-max] current  Temp:  [97.7 F (36.5 C)-98.5 F (36.9 C)] 97.7 F (36.5 C) (04/01 0326) Pulse Rate:  [48-65] 59 (04/01 0326) Resp:  [11-20] 19 (04/01 0326) BP: (119-139)/(62-74) 130/62 (04/01 0326) SpO2:  [93 %-100 %] 94 % (04/01 0326) Weight:  [96.7 kg] 96.7 kg (04/01 0329)     Height: 5\' 9"  (175.3 cm) Weight: 96.7 kg BMI (Calculated): 31.47   Intake/Output last 2 shifts:  03/31 0701 - 04/01 0700 In: 382.3 [P.O.:360; IV Piggyback:22.3] Out: 1600 [Urine:1600]   Physical Exam:  Constitutional: alert, cooperative and no distress  HENT: normocephalic without obvious abnormality  Eyes: PERRL, EOM's grossly intact and symmetric  Respiratory: breathing non-labored at rest; on Mona Cardiovascular: regular rate and sinus rhythm Gastrointestinal: soft, non-tender, distended, still tympanic, no rebound/guarding Musculoskeletal: no edema or wounds, motor and sensation grossly intact, NT     Labs:     Latest Ref Rng & Units 03/18/2024    4:57 AM 03/17/2024    3:18 AM 03/16/2024    5:47 AM  CBC  WBC 4.0 - 10.5 K/uL 8.1  10.5  9.0   Hemoglobin 13.0 - 17.0 g/dL 19.1  47.8  29.5   Hematocrit 39.0 - 52.0 % 32.2  33.3  34.8   Platelets 150 - 400 K/uL 288  287  293       Latest Ref Rng & Units 03/19/2024    5:36 AM 03/18/2024    4:57 AM 03/17/2024    3:18 AM  CMP  Glucose 70 - 99 mg/dL 621  308  657   BUN 8 - 23 mg/dL 46  44  35   Creatinine 0.61 - 1.24 mg/dL 8.46  9.62  9.52   Sodium 135 - 145 mmol/L 138  136  136   Potassium 3.5 - 5.1 mmol/L 4.4  4.7  4.1   Chloride 98 - 111 mmol/L 102  102  101   CO2 22 - 32 mmol/L 29  26  25    Calcium 8.9 - 10.3 mg/dL 8.2  8.0  8.0      Imaging studies:   KUB (03/19/2024) personally reviewed with distension of the cecum, appears to be progressing, stomach also massively dilated, remainder of colon appears decompressed, no free air, and radiologist report pending...   Assessment/Plan: 83 y.o. male with Ogilvie Syndrome, complicated by pertinent comorbidities including COPD, asthma, CAD, history of MI, CHF, HTN, HLD. Marland Kitchen   - He is quite nauseous this AM which appears consistent with gastric dilation on KUB this AM. Additionally, cecum appears more distended  when compared to previous imaging. I am concerned that this may be recurring. He is not a candidate for neostigmine given significant bradycardia after administration earlier this admission. He does understand that if this continues to progress, we may need to consider surgical intervention which would potentially involve subtotal colectomy and end ileostomy creation. For now, there is no evidence of perforation or bowel compromise to warrant emergent interventions.   - Discussed role for NGT placement in setting of massive stomach dilation on imaging and likely source of his nausea this AM. He would like to avoid this if possible. He understands that if he has any emesis, we need to  place this.   - Will back down to sips of CLD; nothing further  - Continue TPN; advance to goal; monitor electrolytes - I do not think we can utilize gut reliably at this time.   - Would continue rectal tube for now; monitor bowel function   - Monitor abdominal examination  - Serial KUB as needed; will order for AM  - Pain control prn; minimize narcotics as feasible - Antiemetics prn - Needs to mobilize; therapies on board   - Further management per primary service; we will follow   All of the above findings and recommendations were discussed with the patient, and the medical team, and all of patient's questions were answered to his expressed satisfaction.  -- Lynden Oxford, PA-C [facility] 03/19/2024, 7:20 AM M-F: 7am - 4pm

## 2024-03-19 NOTE — Progress Notes (Signed)
 PHARMACY - TOTAL PARENTERAL NUTRITION CONSULT NOTE   Indication: Ogilvie's syndrome  Patient Measurements: Height: 5\' 9"  (175.3 cm) Weight: 96.7 kg (213 lb 3 oz) IBW/kg (Calculated) : 70.7 TPN AdjBW (KG): 77.3 Body mass index is 31.48 kg/m.  Assessment: 83 y.o. 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   Glucose / Insulin: BG 132-140 sSSI/24 hrs: 3 units On prednisone 40 mg po daily Electrolytes: WNL Renal: SCr 1.5 > 1.12 > 1.25 Hepatic: LFTs, bilirubin wnl Intake / Output;  MIVF: none GI Imaging:Serial KUB prn --No new pertinent imaging studies  GI Surgeries / Procedures: none recent  Central access: 03/18/24 (pending) TPN start date: 03/18/24 Estimated Needs Total Energy Estimated Needs: 1900-2200kcal/day Total Protein Estimated Needs: 95-110g/day Total Fluid Estimated Needs: 1.9-2.2L/day  Clinimix E 8/10 @ 70ml/hr + Lipids daily would supply: 1164  avg Kcal/day 80 g   avg Protein/day  Current Nutrition:  CLD  Plan:  ---Continue Clinimix E 8/10   TPN at 67mL/hr + SMOF lipids 250 mL over 12 hours at 1800 ---Electrolytes in TPN: standard E8/10 ---Add standard MVI and trace elements to TPN --thiamine ordered outside of TPN ---Initiate Sensitive q6h SSI and adjust as needed  ---Monitor TPN labs daily until stable then on Mon/Thurs  Bari Mantis PharmD Clinical Pharmacist 03/19/2024

## 2024-03-19 NOTE — Plan of Care (Signed)

## 2024-03-19 NOTE — TOC Progression Note (Signed)
 Transition of Care [facility]) - Progression Note    Patient Details  Name: Todd Rojas MRN: [id number] Date of Birth: 1941/11/17  [facility] CM/SW Contact  Margarito Liner, LCSW Phone Number: 03/19/2024, 12:49 PM  Clinical Narrative: CSW called and notified [facility] admissions coordinator that patient is not ready for discharge yet but that wife had accepted bed offer over the weekend.    Expected Discharge Plan: Skilled Nursing Facility Barriers to Discharge: Continued Medical Work up  Expected Discharge Plan and Services     Post Acute Care Choice:  (TBD) Living arrangements for the past 2 months: Single Family Home                                       Social Determinants of Health (SDOH) Interventions SDOH Screenings   Food Insecurity: No Food Insecurity (03/10/2024)  Housing: Low Risk  (03/10/2024)  Transportation Needs: No Transportation Needs (03/10/2024)  Utilities: Not At Risk (03/10/2024)  Depression (PHQ2-9): Low Risk  (07/12/2021)  Financial Resource Strain: Low Risk  (10/23/2023)   Received from [facility]  Social Connections: Socially Integrated (03/10/2024)  Tobacco Use: Medium Risk (03/09/2024)    Readmission Risk Interventions     No data to display

## 2024-03-20 ENCOUNTER — Inpatient Hospital Stay: Payer: Self-pay | Admitting: Anesthesiology

## 2024-03-20 ENCOUNTER — Encounter: Payer: Self-pay | Admitting: Internal Medicine

## 2024-03-20 ENCOUNTER — Other Ambulatory Visit: Payer: Self-pay

## 2024-03-20 ENCOUNTER — Inpatient Hospital Stay

## 2024-03-20 ENCOUNTER — Encounter
Admission: EM | Disposition: A | Discharge status: Home Health | Payer: Self-pay | Source: Home / Self Care | Attending: Internal Medicine

## 2024-03-20 DIAGNOSIS — R6520 Severe sepsis without septic shock: Secondary | ICD-10-CM | POA: Diagnosis not present

## 2024-03-20 DIAGNOSIS — A419 Sepsis, unspecified organism: Secondary | ICD-10-CM | POA: Diagnosis not present

## 2024-03-20 DIAGNOSIS — K5981 Ogilvie syndrome: Secondary | ICD-10-CM | POA: Diagnosis not present

## 2024-03-20 HISTORY — PX: XI ROBOT ASSISTED DIAGNOSTIC LAPAROSCOPY: SHX6815

## 2024-03-20 LAB — TYPE AND SCREEN
ABO/RH(D): A POS
Antibody Screen: POSITIVE
Unit division: 0
Unit division: 0

## 2024-03-20 LAB — BPAM RBC
Blood Product Expiration Date: 202505032359
Blood Product Expiration Date: 202505032359
Unit Type and Rh: 6200
Unit Type and Rh: 6200

## 2024-03-20 LAB — COMPREHENSIVE METABOLIC PANEL WITH GFR
ALT: 21 U/L (ref 0–44)
AST: 19 U/L (ref 15–41)
Albumin: 2.3 g/dL — ABNORMAL LOW (ref 3.5–5.0)
Alkaline Phosphatase: 28 U/L — ABNORMAL LOW (ref 38–126)
Anion gap: 6 (ref 5–15)
BUN: 47 mg/dL — ABNORMAL HIGH (ref 8–23)
CO2: 26 mmol/L (ref 22–32)
Calcium: 7.8 mg/dL — ABNORMAL LOW (ref 8.9–10.3)
Chloride: 100 mmol/L (ref 98–111)
Creatinine, Ser: 0.98 mg/dL (ref 0.61–1.24)
GFR, Estimated: >60 mL/min (ref >60)
Glucose, Bld: 151 mg/dL — ABNORMAL HIGH (ref 70–99)
Potassium: 4.7 mmol/L (ref 3.5–5.1)
Sodium: 132 mmol/L — ABNORMAL LOW (ref 135–145)
Total Bilirubin: 0.4 mg/dL (ref 0.0–1.2)
Total Protein: 5.7 g/dL — ABNORMAL LOW (ref 6.5–8.1)

## 2024-03-20 LAB — CBC
HCT: 33.2 % — ABNORMAL LOW (ref 39.0–52.0)
Hemoglobin: 11.1 g/dL — ABNORMAL LOW (ref 13.0–17.0)
MCH: 32.6 pg (ref 26.0–34.0)
MCHC: 33.4 g/dL (ref 30.0–36.0)
MCV: 97.4 fL (ref 80.0–100.0)
Platelets: 234 10*3/uL (ref 150–400)
RBC: 3.41 MIL/uL — ABNORMAL LOW (ref 4.22–5.81)
RDW: 13.5 % (ref 11.5–15.5)
WBC: 9.6 10*3/uL (ref 4.0–10.5)
nRBC: 0 % (ref 0.0–0.2)

## 2024-03-20 LAB — GLUCOSE, CAPILLARY
Glucose-Capillary: 152 mg/dL — ABNORMAL HIGH (ref 70–99)
Glucose-Capillary: 190 mg/dL — ABNORMAL HIGH (ref 70–99)
Glucose-Capillary: 199 mg/dL — ABNORMAL HIGH (ref 70–99)
Glucose-Capillary: 203 mg/dL — ABNORMAL HIGH (ref 70–99)

## 2024-03-20 LAB — PHOSPHORUS: Phosphorus: 4.2 mg/dL (ref 2.5–4.6)

## 2024-03-20 LAB — MAGNESIUM: Magnesium: 2.6 mg/dL — ABNORMAL HIGH (ref 1.7–2.4)

## 2024-03-20 SURGERY — LAPAROSCOPY, DIAGNOSTIC, ROBOT-ASSISTED
Anesthesia: General | Site: Abdomen

## 2024-03-20 MED ORDER — SEVOFLURANE IN SOLN
RESPIRATORY_TRACT | Status: AC
  Filled 2024-03-20: qty 250

## 2024-03-20 MED ORDER — SODIUM CHLORIDE 0.9 % IV SOLN: INTRAVENOUS | Status: DC | PRN

## 2024-03-20 MED ORDER — BUPIVACAINE-EPINEPHRINE 0.25% -1:200000 IJ SOLN
INTRAMUSCULAR | Status: DC | PRN
  Administered 2024-03-20: 50 mL via INTRAMUSCULAR

## 2024-03-20 MED ORDER — DROPERIDOL 2.5 MG/ML IJ SOLN: 0.6250 mg | Freq: Once | INTRAMUSCULAR | Status: DC | PRN

## 2024-03-20 MED ORDER — ROCURONIUM BROMIDE 100 MG/10ML IV SOLN
INTRAVENOUS | Status: DC | PRN
  Administered 2024-03-20: 20 mg via INTRAVENOUS
  Administered 2024-03-20: 60 mg via INTRAVENOUS
  Administered 2024-03-20: 10 mg via INTRAVENOUS

## 2024-03-20 MED ORDER — LIDOCAINE HCL (PF) 2 % IJ SOLN
INTRAMUSCULAR | Status: DC | PRN
  Administered 2024-03-20: 40 mg via INTRADERMAL
  Administered 2024-03-20: 60 mg via INTRADERMAL

## 2024-03-20 MED ORDER — FENTANYL CITRATE (PF) 100 MCG/2ML IJ SOLN
INTRAMUSCULAR | Status: AC
  Filled 2024-03-20: qty 2

## 2024-03-20 MED ORDER — EPHEDRINE SULFATE-NACL 50-0.9 MG/10ML-% IV SOSY
PREFILLED_SYRINGE | INTRAVENOUS | Status: DC | PRN
  Administered 2024-03-20: 10 mg via INTRAVENOUS

## 2024-03-20 MED ORDER — OXYCODONE HCL 5 MG/5ML PO SOLN: 5.0000 mg | Freq: Once | ORAL | Status: DC | PRN

## 2024-03-20 MED ORDER — MORPHINE SULFATE (PF) 2 MG/ML IV SOLN
2.0000 mg | INTRAVENOUS | Status: DC | PRN
  Administered 2024-03-24 – 2024-03-30 (×8): 2 mg via INTRAVENOUS
  Filled 2024-03-20 (×9): qty 1

## 2024-03-20 MED ORDER — FENTANYL CITRATE (PF) 100 MCG/2ML IJ SOLN
INTRAMUSCULAR | Status: DC | PRN
  Administered 2024-03-20 (×2): 50 ug via INTRAVENOUS

## 2024-03-20 MED ORDER — ACETAMINOPHEN 10 MG/ML IV SOLN
INTRAVENOUS | Status: DC | PRN
  Administered 2024-03-20: 1000 mg via INTRAVENOUS

## 2024-03-20 MED ORDER — DEXAMETHASONE SODIUM PHOSPHATE 10 MG/ML IJ SOLN
INTRAMUSCULAR | Status: DC | PRN
  Administered 2024-03-20: 10 mg via INTRAVENOUS

## 2024-03-20 MED ORDER — TRAMADOL HCL 50 MG PO TABS
50.0000 mg | ORAL_TABLET | Freq: Four times a day (QID) | ORAL | Status: DC | PRN
  Administered 2024-03-20 – 2024-03-29 (×4): 50 mg via ORAL
  Filled 2024-03-20 (×5): qty 1

## 2024-03-20 MED ORDER — SUGAMMADEX SODIUM 200 MG/2ML IV SOLN
INTRAVENOUS | Status: DC | PRN
  Administered 2024-03-20: 200 mg via INTRAVENOUS

## 2024-03-20 MED ORDER — ACETAMINOPHEN 10 MG/ML IV SOLN: 1000.0000 mg | Freq: Once | INTRAVENOUS | Status: DC | PRN

## 2024-03-20 MED ORDER — PROPOFOL 10 MG/ML IV BOLUS
INTRAVENOUS | Status: AC
  Filled 2024-03-20: qty 20

## 2024-03-20 MED ORDER — LACTATED RINGERS IV SOLN: INTRAVENOUS | Status: DC | PRN

## 2024-03-20 MED ORDER — PHENYLEPHRINE HCL-NACL 20-0.9 MG/250ML-% IV SOLN
INTRAVENOUS | Status: AC
  Filled 2024-03-20: qty 250

## 2024-03-20 MED ORDER — BUPIVACAINE-EPINEPHRINE (PF) 0.25% -1:200000 IJ SOLN
INTRAMUSCULAR | Status: AC
  Filled 2024-03-20: qty 30

## 2024-03-20 MED ORDER — FENTANYL CITRATE (PF) 100 MCG/2ML IJ SOLN
25.0000 ug | INTRAMUSCULAR | Status: DC | PRN
  Administered 2024-03-20: 25 ug via INTRAVENOUS

## 2024-03-20 MED ORDER — PROPOFOL 1000 MG/100ML IV EMUL
INTRAVENOUS | Status: AC
  Filled 2024-03-20: qty 100

## 2024-03-20 MED ORDER — FAT EMUL FISH OIL/PLANT BASED 20% (SMOFLIPID)IV EMUL
250.0000 mL | INTRAVENOUS | Status: AC
  Administered 2024-03-20: 250 mL via INTRAVENOUS
  Filled 2024-03-20: qty 250

## 2024-03-20 MED ORDER — SODIUM CHLORIDE (PF) 0.9 % IJ SOLN
INTRAMUSCULAR | Status: AC
  Filled 2024-03-20: qty 50

## 2024-03-20 MED ORDER — GLYCOPYRROLATE 0.2 MG/ML IJ SOLN
INTRAMUSCULAR | Status: DC | PRN
  Administered 2024-03-20: .2 mg via INTRAVENOUS

## 2024-03-20 MED ORDER — TRACE MINERALS CU-MN-SE-ZN 300-55-60-3000 MCG/ML IV SOLN
INTRAVENOUS | Status: AC
  Filled 2024-03-20: qty 1000

## 2024-03-20 MED ORDER — OXYCODONE HCL 5 MG PO TABS: 5.0000 mg | ORAL_TABLET | Freq: Once | ORAL | Status: DC | PRN

## 2024-03-20 MED ORDER — SODIUM CHLORIDE 0.9 % IV SOLN: 2.0000 g | Freq: Once | INTRAVENOUS | Status: DC

## 2024-03-20 MED ORDER — PREGABALIN 50 MG PO CAPS
50.0000 mg | ORAL_CAPSULE | Freq: Three times a day (TID) | ORAL | Status: DC
  Administered 2024-03-20 – 2024-03-24 (×14): 50 mg via ORAL
  Filled 2024-03-20 (×14): qty 1

## 2024-03-20 MED ORDER — PROPOFOL 10 MG/ML IV BOLUS
INTRAVENOUS | Status: DC | PRN
  Administered 2024-03-20: 90 mg via INTRAVENOUS
  Administered 2024-03-20: 50 mg via INTRAVENOUS

## 2024-03-20 MED ORDER — BUPIVACAINE LIPOSOME 1.3 % IJ SUSP
INTRAMUSCULAR | Status: AC
Start: 2024-03-20 — End: ?
  Filled 2024-03-20: qty 20

## 2024-03-20 MED ORDER — KETAMINE HCL 50 MG/5ML IJ SOSY
PREFILLED_SYRINGE | INTRAMUSCULAR | Status: DC | PRN
  Administered 2024-03-20: 20 mg via INTRAVENOUS
  Administered 2024-03-20: 10 mg via INTRAVENOUS
  Administered 2024-03-20: 20 mg via INTRAVENOUS

## 2024-03-20 MED ORDER — 0.9 % SODIUM CHLORIDE (POUR BTL) OPTIME
TOPICAL | Status: DC | PRN
  Administered 2024-03-20: 500 mL

## 2024-03-20 MED ORDER — KETAMINE HCL 50 MG/5ML IJ SOSY
PREFILLED_SYRINGE | INTRAMUSCULAR | Status: AC
  Filled 2024-03-20: qty 5

## 2024-03-20 SURGICAL SUPPLY — 49 items
ADHESIVE MASTISOL STRL (MISCELLANEOUS) ×1 IMPLANT
CANNULA REDUCER 12-8 DVNC XI (CANNULA) ×2 IMPLANT
CATH ROBINSON RED A/P 16FR (CATHETERS) ×1 IMPLANT
COVER TIP SHEARS 8 DVNC (MISCELLANEOUS) ×2 IMPLANT
DEFOGGER SCOPE WARMER CLEARIFY (MISCELLANEOUS) ×1 IMPLANT
DERMABOND ADVANCED .7 DNX12 (GAUZE/BANDAGES/DRESSINGS) ×2 IMPLANT
DRAPE ARM DVNC X/XI (DISPOSABLE) ×8 IMPLANT
DRAPE COLUMN DVNC XI (DISPOSABLE) ×2 IMPLANT
DRAPE SHEET LG 3/4 BI-LAMINATE (DRAPES) ×1 IMPLANT
ELECT REM PT RETURN 9FT ADLT (ELECTROSURGICAL) ×2 IMPLANT
ELECTRODE REM PT RTRN 9FT ADLT (ELECTROSURGICAL) ×2 IMPLANT
FORCEPS BPLR R/ABLATION 8 DVNC (INSTRUMENTS) ×2 IMPLANT
FORCEPS CADIERE DVNC XI (FORCEP) ×1 IMPLANT
GLOVE BIO SURGEON STRL SZ7 (GLOVE) ×6 IMPLANT
GOWN STRL REUS W/ TWL LRG LVL3 (GOWN DISPOSABLE) ×8 IMPLANT
GRASPER SUT TROCAR 14GX15 (MISCELLANEOUS) ×1 IMPLANT
GRASPER TIP-UP FEN DVNC XI (INSTRUMENTS) ×2 IMPLANT
HANDLE YANKAUER SUCT BULB TIP (MISCELLANEOUS) ×1 IMPLANT
HANDLE YANKAUER SUCT OPEN TIP (MISCELLANEOUS) ×1 IMPLANT
KIT PINK PAD W/HEAD ARE REST (MISCELLANEOUS) ×2 IMPLANT
KIT PINK PAD W/HEAD ARM REST (MISCELLANEOUS) ×1 IMPLANT
LABEL OR SOLS (LABEL) ×2 IMPLANT
MANIFOLD NEPTUNE II (INSTRUMENTS) ×2 IMPLANT
NDL DRIVE SUT CUT DVNC (INSTRUMENTS) ×1 IMPLANT
NDL HYPO 22X1.5 SAFETY MO (MISCELLANEOUS) ×1 IMPLANT
NDL INSUFFLATION 14GA 150MM (NEEDLE) IMPLANT
NEEDLE DRIVE SUT CUT DVNC (INSTRUMENTS) ×2 IMPLANT
NEEDLE HYPO 22X1.5 SAFETY MO (MISCELLANEOUS) ×2 IMPLANT
NEEDLE INSUFFLATION 14GA 150MM (NEEDLE) ×2 IMPLANT
NS IRRIG 500ML POUR BTL (IV SOLUTION) ×2 IMPLANT
OBTURATOR OPTICAL STND 8 DVNC (TROCAR) ×2 IMPLANT
OBTURATOR OPTICALSTD 8 DVNC (TROCAR) ×2 IMPLANT
PACK COLON CLEAN CLOSURE (MISCELLANEOUS) ×1 IMPLANT
PACK LAP CHOLECYSTECTOMY (MISCELLANEOUS) ×2 IMPLANT
SCISSORS MNPLR CVD DVNC XI (INSTRUMENTS) ×2 IMPLANT
SEAL UNIV 5-12 XI (MISCELLANEOUS) ×7 IMPLANT
SET TUBE SMOKE EVAC HIGH FLOW (TUBING) ×1 IMPLANT
SOL ELECTROSURG ANTI STICK (MISCELLANEOUS) ×2 IMPLANT
SOLUTION ELECTROSURG ANTI STCK (MISCELLANEOUS) ×2 IMPLANT
SPIKE FLUID TRANSFER (MISCELLANEOUS) ×1 IMPLANT
SPONGE T-LAP 18X18 ~~LOC~~+RFID (SPONGE) ×5 IMPLANT
SUT MNCRL AB 4-0 PS2 18 (SUTURE) ×2 IMPLANT
SUT SILK 2 0 SH (SUTURE) ×3 IMPLANT
SUT VIC AB 3-0 SH 27X BRD (SUTURE) ×3 IMPLANT
SUT VICRYL 0 UR6 27IN ABS (SUTURE) ×1 IMPLANT
SYR 20ML LL LF (SYRINGE) ×4 IMPLANT
TAPE TRANSPORE STRL 2 31045 (GAUZE/BANDAGES/DRESSINGS) ×1 IMPLANT
TROCAR ADV FIXATION 12X100MM (TROCAR) ×1 IMPLANT
TUBING CONNECTING 10 (TUBING) ×1 IMPLANT

## 2024-03-20 NOTE — Progress Notes (Signed)
 OT Cancellation Note  Patient Details Name: Todd Rojas MRN: [id number] DOB: 09-09-1941   Cancelled Treatment:    Reason Eval/Treat Not Completed: Patient at procedure or test/ unavailable. Per chart review, pt having surgery today. OT will follow up after surgical intervention when appropriate.   Jackquline Denmark, MS, OTR/L , CBIS ascom [phone]  03/20/24, 9:54 AM

## 2024-03-20 NOTE — Plan of Care (Signed)
 Problem: Fluid Volume: Goal: Hemodynamic stability will improve 03/20/2024 0040 by Minette Headland, RN Outcome: Progressing 03/20/2024 0040 by Minette Headland, RN Outcome: Progressing   Problem: Clinical Measurements: Goal: Diagnostic test results will improve 03/20/2024 0040 by Minette Headland, RN Outcome: Progressing 03/20/2024 0040 by Minette Headland, RN Outcome: Progressing Goal: Signs and symptoms of infection will decrease 03/20/2024 0040 by Minette Headland, RN Outcome: Progressing 03/20/2024 0040 by Minette Headland, RN Outcome: Progressing   Problem: Respiratory: Goal: Ability to maintain adequate ventilation will improve 03/20/2024 0040 by Minette Headland, RN Outcome: Progressing 03/20/2024 0040 by Minette Headland, RN Outcome: Progressing   Problem: Education: Goal: Knowledge of General Education information will improve Description: Including pain rating scale, medication(s)/side effects and non-pharmacologic comfort measures 03/20/2024 0040 by Minette Headland, RN Outcome: Progressing 03/20/2024 0040 by Minette Headland, RN Outcome: Progressing   Problem: Health Behavior/Discharge Planning: Goal: Ability to manage health-related needs will improve 03/20/2024 0040 by Minette Headland, RN Outcome: Progressing 03/20/2024 0040 by Minette Headland, RN Outcome: Progressing   Problem: Clinical Measurements: Goal: Ability to maintain clinical measurements within normal limits will improve 03/20/2024 0040 by Minette Headland, RN Outcome: Progressing 03/20/2024 0040 by Minette Headland, RN Outcome: Progressing Goal: Will remain free from infection 03/20/2024 0040 by Minette Headland, RN Outcome: Progressing 03/20/2024 0040 by Minette Headland, RN Outcome: Progressing Goal: Diagnostic test results will improve 03/20/2024 0040 by Minette Headland, RN Outcome: Progressing 03/20/2024 0040 by Minette Headland, RN Outcome: Progressing Goal: Respiratory complications will improve 03/20/2024 0040 by Minette Headland, RN Outcome: Progressing 03/20/2024 0040 by Minette Headland, RN Outcome: Progressing Goal: Cardiovascular complication will be avoided 03/20/2024 0040 by Minette Headland, RN Outcome: Progressing 03/20/2024 0040 by Minette Headland, RN Outcome: Progressing   Problem: Activity: Goal: Risk for activity intolerance will decrease 03/20/2024 0040 by Minette Headland, RN Outcome: Progressing 03/20/2024 0040 by Minette Headland, RN Outcome: Progressing   Problem: Nutrition: Goal: Adequate nutrition will be maintained 03/20/2024 0040 by Minette Headland, RN Outcome: Progressing 03/20/2024 0040 by Minette Headland, RN Outcome: Progressing   Problem: Coping: Goal: Level of anxiety will decrease 03/20/2024 0040 by Minette Headland, RN Outcome: Progressing 03/20/2024 0040 by Minette Headland, RN Outcome: Progressing   Problem: Elimination: Goal: Will not experience complications related to bowel motility 03/20/2024 0040 by Minette Headland, RN Outcome: Progressing 03/20/2024 0040 by Minette Headland, RN Outcome: Progressing Goal: Will not experience complications related to urinary retention 03/20/2024 0040 by Minette Headland, RN Outcome: Progressing 03/20/2024 0040 by Minette Headland, RN Outcome: Progressing   Problem: Pain Managment: Goal: General experience of comfort will improve and/or be controlled 03/20/2024 0040 by Minette Headland, RN Outcome: Progressing 03/20/2024 0040 by Minette Headland, RN Outcome: Progressing   Problem: Safety: Goal: Ability to remain free from injury will improve 03/20/2024 0040 by Minette Headland, RN Outcome: Progressing 03/20/2024 0040 by Minette Headland, RN Outcome: Progressing   Problem: Skin Integrity: Goal: Risk for impaired skin integrity will decrease 03/20/2024 0040 by Minette Headland, RN Outcome: Progressing 03/20/2024 0040 by Minette Headland, RN Outcome: Progressing   Problem: Education: Goal: Ability to describe self-care measures that may prevent or decrease  complications (Diabetes Survival Skills Education) will improve 03/20/2024 0040 by Minette Headland, RN Outcome: Progressing 03/20/2024 0040 by Minette Headland, RN Outcome: Progressing Goal: Individualized Educational Video(s) 03/20/2024 0040 by  Minette Headland, RN Outcome: Progressing 03/20/2024 0040 by Minette Headland, RN Outcome: Progressing   Problem: Coping: Goal: Ability to adjust to condition or change in health will improve 03/20/2024 0040 by Minette Headland, RN Outcome: Progressing 03/20/2024 0040 by Minette Headland, RN Outcome: Progressing   Problem: Fluid Volume: Goal: Ability to maintain a balanced intake and output will improve 03/20/2024 0040 by Minette Headland, RN Outcome: Progressing 03/20/2024 0040 by Minette Headland, RN Outcome: Progressing   Problem: Health Behavior/Discharge Planning: Goal: Ability to identify and utilize available resources and services will improve 03/20/2024 0040 by Minette Headland, RN Outcome: Progressing 03/20/2024 0040 by Minette Headland, RN Outcome: Progressing Goal: Ability to manage health-related needs will improve Outcome: Progressing   Problem: Metabolic: Goal: Ability to maintain appropriate glucose levels will improve Outcome: Progressing   Problem: Nutritional: Goal: Maintenance of adequate nutrition will improve Outcome: Progressing Goal: Progress toward achieving an optimal weight will improve Outcome: Progressing   Problem: Skin Integrity: Goal: Risk for impaired skin integrity will decrease Outcome: Progressing   Problem: Tissue Perfusion: Goal: Adequacy of tissue perfusion will improve Outcome: Progressing

## 2024-03-20 NOTE — Anesthesia Preprocedure Evaluation (Signed)
 Anesthesia Evaluation  Patient identified by MRN, date of birth, ID band Patient awake    Reviewed: Allergy & Precautions, H&P , NPO status , Patient's Chart, lab work & pertinent test results, reviewed documented beta blocker date and time   Airway Mallampati: II  TM Distance: >3 FB Neck ROM: full    Dental  (+) Teeth Intact   Pulmonary shortness of breath, asthma , COPD,  COPD inhaler, former smoker   Pulmonary exam normal        Cardiovascular Exercise Tolerance: Poor hypertension, On Medications + CAD, + Past MI and +CHF  Normal cardiovascular exam+ dysrhythmias  Rhythm:regular Rate:Normal     Neuro/Psych negative neurological ROS  negative psych ROS   GI/Hepatic Neg liver ROS,GERD  Medicated,,  Endo/Other  negative endocrine ROS    Renal/GU Renal disease  negative genitourinary   Musculoskeletal   Abdominal   Peds  Hematology negative hematology ROS (+)   Anesthesia Other Findings Past Medical History: No date: Actinic keratosis No date: Arthritis No date: Asthma 03/17/2020: Basal cell carcinoma     Comment:  right ant lat deltoid 02/13/2012: CAD (coronary artery disease)     Comment:  Overview:   12/2004 - Anginal equivalent symptoms:  Left              arm pain, weakness, and shortness of breath.   12/2004:                Anterior STEMI.  Transferred to [facility].   01/06/2005 -               Cardiac cath:  EF 51%, an occluded mid LAD and a large               OM2 with 75% ostial stenosis.  PCI of the mid occluded               LAD using a Cypher stent.   03/2005 - 2D echo:  EF >55%,               LAE 4.7 cm, no significant valvular disease.   02/2008:                Plavix stopped.   06/2008:  Recurrent angina - symptoms               of chest tightness.  [facility] to [facility].  Cardiac catheterization.  The patient               stated nonobstructive CAD seen.  Plavix  restarted at that              time.   03/2009 - 2D echo:  EF 35%, akinetic apex and               septum.  Stress echocardiogram deferred for cardiac               catheterization.   03/30/2009:  PCI 90% proximal LAD with              a 3.0 x 18 mm Xience DES.   02/2010: Slow and gradual               return of chest tightness, fatigue and generalized               weakness concerning for return of anginal equivalent.  Stress test did not show ischemia to the heart muscle               during exercise.  Evidenc No date: CHF (congestive heart failure) (HCC) No date: Chronic kidney disease     Comment:  kidney stone left side 07/03/2017: COPD (chronic obstructive pulmonary disease) (HCC) 02/13/2012: Dyslipidemia     Comment:  Overview:   03/2009  TC 158, TG 88, HDL 39, LDL 102.                11/2013  TC 148, TG 115, HDL 44.3, LDL 80.7 (done at               PCP's office)   10/2014 TC 146  11/2015 TC 123, TG 86,               HDL 41.7, LDL 64  11/2016 TC 140, TG 129, HDL 40.1, LDL               74  No date: Dyspnea No date: GERD (gastroesophageal reflux disease) 03/09/2013: GI disease     Comment:  Overview:   Dysphagia - EGD demonstrated esophageal               stricture which was dilated   GERD   Hemorrhoids  03/09/2013: Hearing loss No date: History of kidney stones 07/03/2017: Hyperlipidemia, unspecified 02/13/2012: Hypertension 2004: Myocardial infarction (HCC) 12/09/2016: Personal history of gout 05/05/2015: Primary osteoarthritis of both knees 04/14/2016: Right-sided low back pain without sciatica 06/03/2020: Squamous cell carcinoma of skin     Comment:  R ear - ED&C Past Surgical History: No date: colonoscopy with polypectomy 10/08/2018: COLONOSCOPY WITH PROPOFOL; N/A     Comment:  Procedure: COLONOSCOPY WITH PROPOFOL;  Surgeon: Scot Jun, MD;  Location: [facility] ENDOSCOPY;  Service:               Endoscopy;  Laterality: N/A; 01/06/2023:  COLONOSCOPY WITH PROPOFOL; N/A     Comment:  Procedure: COLONOSCOPY WITH PROPOFOL;  Surgeon:               Regis Bill, MD;  Location: [facility] ENDOSCOPY;                Service: Endoscopy;  Laterality: N/A; No date: coronary stents 08/09/2017: CYSTOSCOPY WITH STENT PLACEMENT; Left     Comment:  Procedure: CYSTOSCOPY WITH STENT PLACEMENT;  Surgeon:               Hildred Laser, MD;  Location: [facility] ORS;  Service:               Urology;  Laterality: Left; 03/17/2024: FLEXIBLE SIGMOIDOSCOPY; N/A     Comment:  Procedure: SIGMOIDOSCOPY, FLEXIBLE;  Surgeon: [place],               Boykin Nearing, MD;  Location: [facility] ENDOSCOPY;  Service:               Gastroenterology;  Laterality: N/A; 2016: MEDIAL PARTIAL KNEE REPLACEMENT; Bilateral 08/09/2017: URETEROSCOPY WITH HOLMIUM LASER LITHOTRIPSY; Left     Comment:  Procedure: URETEROSCOPY WITH HOLMIUM LASER               LITHOTRIPSY/INCISION OF URETEROCELE;  Surgeon: Hildred Laser, MD;  Location: [facility] ORS;  Service: Urology;  Laterality: Left; BMI    Body Mass Index: 30.54 kg/m     Reproductive/Obstetrics negative OB ROS                             Anesthesia Physical Anesthesia Plan  ASA: 3  Anesthesia Plan: General ETT   Post-op Pain Management:    Induction:   PONV Risk Score and Plan: 3  Airway Management Planned:   Additional Equipment:   Intra-op Plan:   Post-operative Plan:   Informed Consent: I have reviewed the patients History and Physical, chart, labs and discussed the procedure including the risks, benefits and alternatives for the proposed anesthesia with the patient or authorized representative who has indicated his/her understanding and acceptance.     Dental Advisory Given  Plan Discussed with: CRNA  Anesthesia Plan Comments:        Anesthesia Quick Evaluation

## 2024-03-20 NOTE — Op Note (Signed)
 Robotic assisted laparoscopic loop ileostomy with decompression of cecum  Pre-operative Diagnosis: Recalcitrant Ogilvie's  Post-operative Diagnosis: same  Procedure:  Robotic assisted laparoscopic loop ileostomy with decompression of cecum  Surgeon: Sterling Big, MD FACS  Anesthesia: Gen. with endotracheal tube  Findings: Dilated but viable cecum 12cms  Estimated Blood Loss: 5cc             Complications: none   Procedure Details  The patient was seen again in the Holding Room. The benefits, complications, treatment options, and expected outcomes were discussed with the patient. The risks of bleeding, infection, recurrence of symptoms, failure to resolve symptoms,bowel leak, hernias, bowel injury, any of which could require further surgery  were reviewed with the patient. The likelihood of improving the patient's symptoms with return to their baseline status is good.  The patient and/or family concurred with the proposed plan, giving informed consent.  The patient was taken to Operating Room, identified  and the procedure verified.  A Time Out was held and the above information confirmed.  Prior to the induction of general anesthesia, antibiotic prophylaxis was administered. VTE prophylaxis was in place. General endotracheal anesthesia was then administered and tolerated well. After the induction, the abdomen was prepped with Chloraprep and draped in the sterile fashion. The patient was positioned in the supine position.  The abdomen was access via Veres needle at Palmer's point. Saline drop test was appropriate as well as insufflation pressures. Pneumoperitoneum was then created with CO2 and tolerated but with bradycardia that required pharmacological support.  Using optiview technique the abdominal cavity was accessed , inspection reveled no injuries and veres needle in appropriate position. Additional  Three 8-mm ports were placed under direct vision.   The patient was positioned  in  Trendelenburg, robot was brought to the surgical field and docked in the standard fashion.  We made sure all the instrumentation was kept indirect view at all times and that there were no collision between the arms. I inserted the silk suture. I scrubbed out and went to the console.  Inspection revealed evidence of cecal dilation, all bowel was viable , no perforation or ischemia was seen. THe bowel was run and TI and cecum identified. I selected a point 10 cm proximal to IC valve. Using 2-0 silk seromuscular stitch was placed and I selected a placed for the ileostomy in the RLQ. Using PMI we exteriorize the stitch. The three skin incision were closed w 4-0 monocryl and TAP block performed under direct visualization using liposomal marcaine mix. Inspection of the lower quadrant was performed. No bleeding,  injury or leak, or bowel injury was noted. Robotic instruments and robotic arms were undocked in the standard fashion.  I scrubbed back in.  I was able to exteriorize the terminal ileum and using a red rubber catheter enterotomy performed and red rubber used to decompressed the cecum. We visualized complete collapse of cecum upon doing this.   Pneumoperitoneum was released.   Mainting sterility I closed the remaining port w 4-0 monocryl. The periumbilical port site was closed with interrumpted 0 Vicryl sutures.  Loop ileostomy was matured in a brook fashion using multiple 3-0 vycril and ileostomy appliance placed  The patient was then extubated and brought to the recovery room in stable condition. Sponge, lap, and needle counts were correct at closure and at the conclusion of the case.               Sterling Big, MD, FACS

## 2024-03-20 NOTE — TOC Progression Note (Signed)
 Transition of Care [facility]) - Progression Note    Patient Details  Name: Marlin Jarrard MRN: [id number] Date of Birth: [birth year]  [facility] CM/SW Contact  Margarito Liner, LCSW Phone Number: 03/20/2024, 3:39 PM  Clinical Narrative:  [facility] admissions coordinator is aware of new ileostomy.   Expected Discharge Plan: Skilled Nursing Facility Barriers to Discharge: Continued Medical Work up  Expected Discharge Plan and Services     Post Acute Care Choice:  (TBD) Living arrangements for the past 2 months: Single Family Home                                       Social Determinants of Health (SDOH) Interventions SDOH Screenings   Food Insecurity: No Food Insecurity (03/10/2024)  Housing: Low Risk  (03/10/2024)  Transportation Needs: No Transportation Needs (03/10/2024)  Utilities: Not At Risk (03/10/2024)  Depression (PHQ2-9): Low Risk  (07/12/2021)  Financial Resource Strain: Low Risk  (10/23/2023)   Received from [facility]  Social Connections: Socially Integrated (03/10/2024)  Tobacco Use: Medium Risk (03/20/2024)    Readmission Risk Interventions     No data to display

## 2024-03-20 NOTE — Transfer of Care (Signed)
 Immediate Anesthesia Transfer of Care Note  Patient: Avelardo Reesman Steinhoff  Procedure(s) Performed: LAPAROSCOPY, DIAGNOSTIC, ROBOT-ASSISTED CREATION, ILEOSTOMY, DIVERTING, ROBOT-ASSISTED CREATION, COLOSTOMY, ROBOT-ASSISTED  Patient Location: PACU  Anesthesia Type:General  Level of Consciousness: drowsy and patient cooperative  Airway & Oxygen Therapy: Patient Spontanous Breathing and Patient connected to nasal cannula oxygen  Post-op Assessment: Report given to RN and Post -op Vital signs reviewed and stable  Post vital signs: stable on baseline 2L of oxygen via [place]  Last Vitals:  Vitals Value Taken Time  BP 140/64 03/20/24 1045  Temp    Pulse 69 03/20/24 1046  Resp 25 03/20/24 1046  SpO2 91 % 03/20/24 1046  Vitals shown include unfiled device data.  Last Pain:  Vitals:   03/20/24 0751  TempSrc: Temporal  PainSc: 0-No pain      Patients Stated Pain Goal: 2 (03/11/24 0800)  Complications: No notable events documented.

## 2024-03-20 NOTE — Progress Notes (Signed)
 PHARMACY - TOTAL PARENTERAL NUTRITION CONSULT NOTE   Indication: Ogilvie's syndrome  Patient Measurements: Height: 5\' 9"  (175.3 cm) Weight: 93.8 kg (206 lb 12.8 oz) IBW/kg (Calculated) : 70.7 TPN AdjBW (KG): 77.3 Body mass index is 30.54 kg/m.  Assessment: 83 y.o. 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   Glucose / Insulin: BG 132-199 sSSI/24 hrs: 7 units On prednisone 40 mg po daily Electrolytes: Na 132 Corrected Ca 9.2   Renal: SCr 0.98 Hepatic: LFTs, bilirubin wnl Intake / Output;  MIVF: none GI Imaging:Serial KUB prn --No new pertinent imaging studies  GI Surgeries / Procedures:  4/2- for surgery today-potentially involve subtotal colectomy and end ileostomy creation.   Central access: 03/18/24 (pending) TPN start date: 03/18/24 Estimated Needs Total Energy Estimated Needs: 1900-2200kcal/day Total Protein Estimated Needs: 95-110g/day Total Fluid Estimated Needs: 1.9-2.2L/day  Nutritional Goals: Recommend 1000 mL premix Clinimix E 8/10 bag 4x/week and 2000 mL Clinimix bag 3x/week with SMOF lipids daily - regimen would provide 1447 average kcal/day and 114 gm of average protein/day   Current Nutrition:  NPO  Plan:  ---Continue premix Clinimix E 8/10   TPN at 44mL/hr today + SMOF lipids at 1800 ---Electrolytes in TPN: standard E8/10 ---Add standard MVI and trace elements to TPN --thiamine ordered outside of TPN ---Initiate Sensitive q6h SSI and adjust as needed  --patient for GI surgery today 4/2 ---Monitor TPN labs daily until stable then on Mon/Thurs  Bari Mantis PharmD Clinical Pharmacist 03/20/2024

## 2024-03-20 NOTE — Progress Notes (Signed)
 Pt bradycardic while sleeping last recorded EKG 47. Pt sleeping with no s/s of distress. Pt easily awaken with no c/o chest pain, SOB, or headache. Daughter at bedside. Continuous cardiac monitoring in place.

## 2024-03-20 NOTE — Progress Notes (Signed)
 PROGRESS NOTE    Todd Rojas  EAV:[id number] DOB: [birth year] DOA: 03/09/2024 PCP: Barbette Reichmann, MD    Brief Narrative:   Todd Rojas is an 83 year old male with history of CKD stage IIIa, chronic gout, hypertension, hyperlipidemia, interstitial pulmonary disease, bronchiectasis, COPD, who presents emergency department for chief concerns of fever, chills, shortness of breath for 2 weeks.   Vitals in the ED showed temperature of 98.4, respiration rate 25, heart rate 94, blood pressure initially 96/69 and improved to 107/66, SpO2 initially desatted to 88% on room air  Status post exploratory laparotomy with robotic assisted laparoscopic loop ileostomy and decompression of cecum on 4/2.  Tolerated procedure well.  No ischemic gut noted  Assessment & Plan:   Principal Problem:   Severe sepsis with acute organ dysfunction (HCC) Active Problems:   Fever   Acute hypoxic respiratory failure (HCC)   CAD (coronary artery disease)   Elevated troponin   Gaseous abdominal distention   COPD (chronic obstructive pulmonary disease) (HCC)   Elevated serum creatinine   Hypertension   Hyperlipidemia, unspecified   Primary osteoarthritis of both knees   Thyroid nodule greater than or equal to 1.5 cm in diameter incidentally noted on imaging study   Dyslipidemia   Personal history of gout   Ogilvie syndrome  Gaseous abdominal distention 2/2 Colonic ileus  Ogilvie's syndrome Patient developed abdominal distention and crampy abdominal pain after presentation. Starting 3/25, Imaging with right colonic gaseous distention without any obvious obstruction.  Colonic distention has been worsening. --GenSurg and GI consulted, dx with Ogilvie's syndrome --neostigmine 2 mg push morning of 3/29 with [name] and [name] present with passing of gas and BM afterwards, however, colonic distention unchanged, so pt underwent endoscopic decompression on 3/30.  Unfortunately colonic gas  re-accumulated. --started TPN on 3/31 Plan: Status post loop ileostomy creation 4/2.  Tolerated procedure well.  No ischemic gut noted in operating room.  Continue liquid diet and TPN for now.  Surgical follow-up.  Bradycardia  2/2 neostigmine administration, HR down to 20's.  S/p atropine. Heart rate improved   * Severe sepsis with acute organ dysfunction (HCC) 2/2 PNA Elevated lactic acid of 2.6, increased respiration rate, with fever of 102 F at home, organ involvement are cardiac and pulmonary with hypoxic respiratory failure at 88% on room air, elevated high sensitive troponin of 98 and elevated BNP --Imaging concerning for right lower lobe pneumonia, although not much upper respiratory symptoms.  Procalcitonin at 0.352. Respiratory panels were negative. UA does not consistent with UTI Patient received broad-spectrum antibiotics Completed antibiotic course Plan: No further antibiotics Monitor vitals and fever curve Repeat chest CT in 3 to 4 weeks  Acute hypoxic respiratory failure (HCC) Currently on 2L --Continue supplemental O2 to keep sats >=92%, wean as tolerated   Elevated troponin 2/2 demand ischemia First high sensitivity troponin was elevated at 98 >>101>175>226,  Suspect secondary to demand ischemia in setting of severe sepsis with acute hypoxic respiratory failure Echocardiogram with EF of 40% and regional wall motion abnormalities, discussed with cardiology and they think these abnormalities are chronic. Patient was also experiencing intermittent chest pain over the past couple of months which seems more exertional --cardio consulted, no plan for cardiac workup   CAD (coronary artery disease) With history of anterior STEMI in 2006, transferred to [facility] status post cardiac catheterization with PCI to mid LAD using Cypher stent; currently no longer on Plavix Complete echo on 05/17/2022: Estimated ejection fraction is 55%, trivial mitral regurgitation, trivial tricuspid  regurgitation, trivial pulmonary valve regurgitation; aortic valve has normal variant --cont home ASA and statin --cont Toprol   COPD (chronic obstructive pulmonary disease) (HCC) --cont home bronchodilator   Elevated serum creatinine Does not meet acute kidney injury, suspect prerenal in setting of nausea vomiting, creatinine with some improvement to 1.36 today Serum creatinine baseline is 1.35-1.36/eGFR 52-55, consistent with CKD stage III A -Monitor renal function -Avoid nephrotoxins   Hypertension, not currently active Blood pressure borderline soft Home furosemide and lisinopril 20 mg daily were not resumed on admission in setting of increase in sCr level from baseline   Hyperlipidemia, unspecified --cont home statin   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 Imaging was obtained which shows very mild right knee effusion and no effusion on left. --Voltaren gel and heat pad   Thyroid nodule greater than or equal to 1.5 cm in diameter incidentally noted on imaging study Incidental finding of a 2.7 cm thyroid nodule. -Thyroid ultrasound with solitary nodule which required outpatient biopsy --TSH wnl   Personal history of gout --cont home allopurinol   Rash Developed morning of 3/28, all over chest, back and bilateral thighs.  Appeared to be allergic rxn.  Rash improved with prednisone.  Completed 5 days of prednisone 40 mg daily.   DVT prophylaxis: Lovenox Code Status: Full Family Communication: Spouse at bedside 4/2 Disposition Plan: Status is: Inpatient Remains inpatient appropriate because: Multiple acute issues as above   Level of care: Med-Surg  Consultants:  General surgery  Procedures:  Exploratory laparotomy with loop ileostomy  Antimicrobials: None   Subjective: Seen and examined.  Sleepy likely due to anesthesia.  No complaints at this time.  Objective: Vitals:   03/20/24 1045 03/20/24 1145 03/20/24 1148 03/20/24  1300  BP: (!) 140/64   (!) 154/75  Pulse: 68  (!) 57   Resp: (!) 21  13 17   Temp:  (!) 97.2 F (36.2 C)    TempSrc:      SpO2: 91%  96%   Weight:      Height:        Intake/Output Summary (Last 24 hours) at 03/20/2024 1558 Last data filed at 03/20/2024 1540 Gross per 24 hour  Intake 1312.66 ml  Output 240 ml  Net 1072.66 ml   Filed Weights   03/17/24 0756 03/19/24 0329 03/20/24 0318  Weight: 88.7 kg 96.7 kg 93.8 kg    Examination:  General exam: Appears calm and comfortable  Respiratory system: Poor respiratory effort.  Bibasilar crackles.  Normal work of breathing.  2 L Cardiovascular system: S1-S2, RRR, no murmurs, no pedal edema Gastrointestinal system: Soft, laparotomy incision CDI, ileostomy Central nervous system: Alert and oriented. No focal neurological deficits. Extremities: Symmetric 5 x 5 power. Skin: No rashes, lesions or ulcers Psychiatry: Judgement and insight appear normal. Mood & affect appropriate.     Data Reviewed: I have personally reviewed following labs and imaging studies  CBC: Recent Labs  Lab 03/14/24 0340 03/16/24 0547 03/17/24 0318 03/18/24 0457 03/20/24 0432  WBC 9.6 9.0 10.5 8.1 9.6  HGB 10.8* 11.5* 11.1* 10.9* 11.1*  HCT 32.8* 34.8* 33.3* 32.2* 33.2*  MCV 98.8 97.2 98.5 95.8 97.4  PLT 251 293 287 288 234   Basic Metabolic Panel: Recent Labs  Lab 03/16/24 0547 03/17/24 0318 03/18/24 0457 03/19/24 0535 03/19/24 0536 03/20/24 0432  NA 132* 136 136  --  138 132*  K 4.6 4.1 4.7  --  4.4 4.7  CL 98 101 102  --  102 100  CO2 26 25 26   --  29 26  GLUCOSE 147* 116* 154*  --  132* 151*  BUN 34* 35* 44*  --  46* 47*  CREATININE 1.25* 1.09 1.12  --  1.25* 0.98  CALCIUM 8.1* 8.0* 8.0*  --  8.2* 7.8*  MG 2.5* 2.5*  --  2.7*  --  2.6*  PHOS  --   --   --   --  4.5 4.2   GFR: Estimated Creatinine Clearance: 64.5 mL/min (by C-G formula based on SCr of 0.98 mg/dL). Liver Function Tests: Recent Labs  Lab 03/16/24 0547 03/19/24 0536  03/20/24 0432  AST 21  --  19  ALT 20  --  21  ALKPHOS 38  --  28*  BILITOT 0.6  --  0.4  PROT 6.5  --  5.7*  ALBUMIN 2.1* 2.2* 2.3*   No results for input(s): "LIPASE", "AMYLASE" in the last 168 hours. No results for input(s): "AMMONIA" in the last 168 hours. Coagulation Profile: No results for input(s): "INR", "PROTIME" in the last 168 hours. Cardiac Enzymes: No results for input(s): "CKTOTAL", "CKMB", "CKMBINDEX", "TROPONINI" in the last 168 hours. BNP (last 3 results) No results for input(s): "PROBNP" in the last 8760 hours. HbA1C: No results for input(s): "HGBA1C" in the last 72 hours. CBG: Recent Labs  Lab 03/19/24 0535 03/19/24 1117 03/19/24 1706 03/20/24 0002 03/20/24 0519  GLUCAP 137* 140* 173* 199* 152*   Lipid Profile: No results for input(s): "CHOL", "HDL", "LDLCALC", "TRIG", "CHOLHDL", "LDLDIRECT" in the last 72 hours. Thyroid Function Tests: No results for input(s): "TSH", "T4TOTAL", "FREET4", "T3FREE", "THYROIDAB" in the last 72 hours. Anemia Panel: No results for input(s): "VITAMINB12", "FOLATE", "FERRITIN", "TIBC", "IRON", "RETICCTPCT" in the last 72 hours. Sepsis Labs: No results for input(s): "PROCALCITON", "LATICACIDVEN" in the last 168 hours.  Recent Results (from the past 240 hours)  MRSA Next Gen by PCR, Nasal     Status: None   Collection Time: 03/11/24  9:32 AM   Specimen: Nasal Mucosa; Nasal Swab  Result Value Ref Range Status   MRSA by PCR Next Gen NOT DETECTED NOT DETECTED Final    Comment: (NOTE) The GeneXpert MRSA Assay (FDA approved for NASAL specimens only), is one component of a comprehensive MRSA colonization surveillance program. It is not intended to diagnose MRSA infection nor to guide or monitor treatment for MRSA infections. Test performance is not FDA approved in patients less than 38 years old. Performed at [facility], [address] [place], [place] [address]   MRSA Next Gen by PCR, Nasal     Status: None    Collection Time: 03/15/24  7:11 PM   Specimen: Nasal Mucosa; Nasal Swab  Result Value Ref Range Status   MRSA by PCR Next Gen NOT DETECTED NOT DETECTED Final    Comment: (NOTE) The GeneXpert MRSA Assay (FDA approved for NASAL specimens only), is one component of a comprehensive MRSA colonization surveillance program. It is not intended to diagnose MRSA infection nor to guide or monitor treatment for MRSA infections. Test performance is not FDA approved in patients less than 78 years old. Performed at [facility], [address] [place], [place] [address]          Radiology Studies: DG ABD ACUTE 2+V W 1V CHEST Result Date: 03/20/2024 CLINICAL DATA:  Over the syndrome. EXAM: DG ABDOMEN ACUTE WITH 1 VIEW CHEST COMPARISON:  03/19/2024 FINDINGS: Asymmetric elevation right hemidiaphragm. The cardio pericardial silhouette is enlarged.  There is pulmonary vascular congestion without overt pulmonary edema. Right PICC line tip overlies the lower SVC level. Upright film shows no evidence for intraperitoneal free air. Interval decrease in the marked gaseous distention of the stomach. The gaseous dilatation of the right and transverse colon has also decreased slightly in the interval since the previous study. No gaseous small bowel dilatation on the current study to suggest small bowel obstruction. IMPRESSION: 1. Interval decrease in the marked gaseous distention of the stomach. 2. Interval decrease in the gaseous dilatation of the right and transverse colon. 3. No evidence for intraperitoneal free air. 4. Enlargement of the cardiopericardial silhouette with pulmonary vascular congestion. Electronically Signed   By: Kennith Center M.D.   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 Evidence for gas within small and  large bowel. Gaseous distension in right lower quadrant is likely associated with the right colon. There is gas in the rectum. No evidence for free air on the upright view. Again noted is mild elevation the right hemidiaphragm. Prominent central vascular markings and concern for at least mild pulmonary edema. Right arm PICC line terminates in the lower SVC region. Heart size is within normal limits and stable. Degenerative changes in the right shoulder. Consolidation or atelectasis at the right lung base. IMPRESSION: 1. Gaseous distension of the stomach and right colon. Findings are nonspecific but could be related to ileus. 2. Prominent central vascular markings and concern for mild pulmonary edema. 3. Right basilar chest densities. Electronically Signed   By: Richarda Overlie M.D.   On: 03/19/2024 11:39        Scheduled Meds:  acetaminophen  1,000 mg Oral Q6H   allopurinol  300 mg Oral Daily   aspirin EC  81 mg Oral Daily   Chlorhexidine Gluconate Cloth  6 each Topical Daily   diclofenac Sodium  2 g Topical TID   enoxaparin (LOVENOX) injection  45 mg Subcutaneous Q24H   feeding supplement  1 Container Oral TID BM   insulin aspart  0-9 Units Subcutaneous Q6H   ketorolac  15 mg Intravenous Q6H   metoprolol succinate  25 mg Oral Daily   mometasone-formoterol  2 puff Inhalation BID   pregabalin  50 mg Oral TID   sodium chloride flush  10-40 mL Intracatheter Q12H   thiamine (VITAMIN B1) injection  100 mg Intravenous Daily   Or   thiamine  100 mg Oral Daily   Continuous Infusions:  cefoTEtan (CEFOTAN) IV     TPN (CLINIMIX-E) Adult     And   fat emul(SMOFlipid)     TPN (CLINIMIX-E) Adult 42 mL/hr at 03/20/24 1300     LOS: 11 days    Tresa Moore, MD [facility]   If 7PM-7AM, please contact night-coverage  03/20/2024, 3:58 PM

## 2024-03-20 NOTE — Anesthesia Procedure Notes (Signed)
 Procedure Name: Intubation Date/Time: 03/20/2024 8:58 AM  Performed by: Maryla Morrow., CRNAPre-anesthesia Checklist: Patient identified, Patient being monitored, Timeout performed, Emergency Drugs available and Suction available Patient Re-evaluated:Patient Re-evaluated prior to induction Oxygen Delivery Method: Circle system utilized Preoxygenation: Pre-oxygenation with 100% oxygen Induction Type: IV induction Ventilation: Mask ventilation without difficulty Laryngoscope Size: McGrath and 4 Grade View: Grade I Tube type: Oral Tube size: 8.0 mm Number of attempts: 1 Airway Equipment and Method: Stylet Placement Confirmation: ETT inserted through vocal cords under direct vision, positive ETCO2 and breath sounds checked- equal and bilateral Secured at: 23 cm Tube secured with: Tape Dental Injury: Teeth and Oropharynx as per pre-operative assessment

## 2024-03-21 ENCOUNTER — Encounter: Payer: Self-pay | Admitting: Surgery

## 2024-03-21 ENCOUNTER — Inpatient Hospital Stay

## 2024-03-21 DIAGNOSIS — R6520 Severe sepsis without septic shock: Secondary | ICD-10-CM | POA: Diagnosis not present

## 2024-03-21 DIAGNOSIS — A419 Sepsis, unspecified organism: Secondary | ICD-10-CM | POA: Diagnosis not present

## 2024-03-21 LAB — COMPREHENSIVE METABOLIC PANEL WITH GFR
ALT: 16 U/L (ref 0–44)
AST: 13 U/L — ABNORMAL LOW (ref 15–41)
Albumin: 2.2 g/dL — ABNORMAL LOW (ref 3.5–5.0)
Alkaline Phosphatase: 28 U/L — ABNORMAL LOW (ref 38–126)
Anion gap: 7 (ref 5–15)
BUN: 46 mg/dL — ABNORMAL HIGH (ref 8–23)
CO2: 26 mmol/L (ref 22–32)
Calcium: 8 mg/dL — ABNORMAL LOW (ref 8.9–10.3)
Chloride: 102 mmol/L (ref 98–111)
Creatinine, Ser: 1.07 mg/dL (ref 0.61–1.24)
GFR, Estimated: >60 mL/min (ref >60)
Glucose, Bld: 156 mg/dL — ABNORMAL HIGH (ref 70–99)
Potassium: 5.2 mmol/L — ABNORMAL HIGH (ref 3.5–5.1)
Sodium: 135 mmol/L (ref 135–145)
Total Bilirubin: 0.4 mg/dL (ref 0.0–1.2)
Total Protein: 5.6 g/dL — ABNORMAL LOW (ref 6.5–8.1)

## 2024-03-21 LAB — GLUCOSE, CAPILLARY
Glucose-Capillary: 141 mg/dL — ABNORMAL HIGH (ref 70–99)
Glucose-Capillary: 145 mg/dL — ABNORMAL HIGH (ref 70–99)
Glucose-Capillary: 146 mg/dL — ABNORMAL HIGH (ref 70–99)

## 2024-03-21 LAB — POTASSIUM: Potassium: 4.8 mmol/L (ref 3.5–5.1)

## 2024-03-21 LAB — MAGNESIUM: Magnesium: 2.5 mg/dL — ABNORMAL HIGH (ref 1.7–2.4)

## 2024-03-21 LAB — PHOSPHORUS: Phosphorus: 4.8 mg/dL — ABNORMAL HIGH (ref 2.5–4.6)

## 2024-03-21 MED ORDER — ENSURE ENLIVE PO LIQD
237.0000 mL | Freq: Three times a day (TID) | ORAL | Status: DC
  Administered 2024-03-21 – 2024-03-22 (×3): 237 mL via ORAL

## 2024-03-21 MED ORDER — ALUM & MAG HYDROXIDE-SIMETH 200-200-20 MG/5ML PO SUSP
30.0000 mL | ORAL | Status: DC | PRN
  Administered 2024-03-21 – 2024-03-26 (×5): 30 mL via ORAL
  Filled 2024-03-21 (×5): qty 30

## 2024-03-21 MED ORDER — AMLODIPINE BESYLATE 5 MG PO TABS
5.0000 mg | ORAL_TABLET | Freq: Every day | ORAL | Status: DC
  Administered 2024-03-21 – 2024-03-28 (×5): 5 mg via ORAL
  Filled 2024-03-21 (×7): qty 1

## 2024-03-21 MED ORDER — TRACE MINERALS CU-MN-SE-ZN 300-55-60-3000 MCG/ML IV SOLN
INTRAVENOUS | Status: AC
  Filled 2024-03-21: qty 1000

## 2024-03-21 MED ORDER — PANTOPRAZOLE SODIUM 40 MG PO TBEC: 40.0000 mg | DELAYED_RELEASE_TABLET | Freq: Two times a day (BID) | ORAL | Status: DC

## 2024-03-21 MED ORDER — PANTOPRAZOLE SODIUM 40 MG PO TBEC
40.0000 mg | DELAYED_RELEASE_TABLET | Freq: Every day | ORAL | Status: DC
  Administered 2024-03-21 – 2024-03-24 (×4): 40 mg via ORAL
  Filled 2024-03-21 (×4): qty 1

## 2024-03-21 MED ORDER — FAT EMUL FISH OIL/PLANT BASED 20% (SMOFLIPID)IV EMUL
250.0000 mL | INTRAVENOUS | Status: AC
Start: 2024-03-21 — End: 2024-03-22
  Administered 2024-03-21: 250 mL via INTRAVENOUS
  Filled 2024-03-21: qty 250

## 2024-03-21 MED ORDER — FUROSEMIDE 10 MG/ML IJ SOLN
20.0000 mg | Freq: Once | INTRAMUSCULAR | Status: AC
  Administered 2024-03-21: 20 mg via INTRAVENOUS
  Filled 2024-03-21: qty 4

## 2024-03-21 NOTE — Progress Notes (Signed)
 Physical Therapy Re-Evaluation Patient Details Name: Todd Rojas MRN: [id number] DOB: [birth year] Today's Date: 03/21/2024   History of Present Illness Pt is an 83 year old male   MD work up including sepsis, acute hypoxic respiratory failure, PNA    Pmh significant for CKD stage IIIa, chronic gout, hypertension, hyperlipidemia, interstitial pulmonary disease, bronchiectasis, COPD    PT Comments  Pt received in Semi-Fowler's position and agreeable to therapy.  Pt responded well to the log rolling instructions and was able to perform transfer training to come upright into standing.  Pt noted some residual soreness from surgical intervention, but is doing well overall.  Pt able to ambulate around the hallway down and back and before returning to the bed.  Pt left with all needs met and requiring some assistance with mobility of the LE's into the bed.  Pt recommendations have been updated as well as goals due to being re-evaluation.     If plan is discharge home, recommend the following: Assist for transportation;Help with stairs or ramp for entrance;A little help with bathing/dressing/bathroom;A little help with walking and/or transfers;Assistance with cooking/housework   Can travel by private vehicle     Yes  Equipment Recommendations  Rolling walker (2 wheels);BSC/3in1    Recommendations for Other Services       Precautions / Restrictions Precautions Precautions: Fall Recall of Precautions/Restrictions: Intact Restrictions Weight Bearing Restrictions Per Provider Order: No     Mobility  Bed Mobility Overal bed mobility: Needs Assistance Bed Mobility: Supine to Sit, Rolling Rolling: Supervision   Supine to sit: HOB elevated, Contact guard Sit to supine: Min assist   General bed mobility comments: cues for log rolling provided.    Transfers Overall transfer level: Needs assistance Equipment used: Rolling walker (2 wheels) Transfers: Sit to/from Stand Sit to Stand:  Min assist           General transfer comment: minA for standing, but very minimal.  RW stabilization utilized as well.    Ambulation/Gait Ambulation/Gait assistance: Contact guard assist Gait Distance (Feet): 200 Feet Assistive device: Rolling walker (2 wheels) Gait Pattern/deviations: Step-through pattern, Decreased step length - right, Decreased step length - left Gait velocity: decreased         Stairs             Wheelchair Mobility     Tilt Bed    Modified Rankin (Stroke Patients Only)       Balance Overall balance assessment: Needs assistance Sitting-balance support: Feet supported, Bilateral upper extremity supported Sitting balance-Leahy Scale: Good     Standing balance support: Bilateral upper extremity supported, Reliant on assistive device for balance Standing balance-Leahy Scale: Fair                              Communication    Cognition Arousal: Alert Behavior During Therapy: WFL for tasks assessed/performed   PT - Cognitive impairments: No apparent impairments                         Following commands: Intact      Cueing    Exercises      General Comments        Pertinent Vitals/Pain Pain Assessment Pain Assessment: Faces Faces Pain Scale: Hurts a little bit Pain Descriptors / Indicators: Grimacing, Guarding, Sore Pain Intervention(s): Limited activity within patient's tolerance, Repositioned, Monitored during session    Home Living  Prior Function            PT Goals (current goals can now be found in the care plan section) Acute Rehab PT Goals Patient Stated Goal: to feel better PT Goal Formulation: With patient Time For Goal Achievement: 04/04/24 Potential to Achieve Goals: Good Progress towards PT goals: Progressing toward goals;Goals updated    Frequency    Min 2X/week      PT Plan      Co-evaluation PT/OT/SLP Co-Evaluation/Treatment:  Yes Reason for Co-Treatment: Complexity of the patient's impairments (multi-system involvement) PT goals addressed during session: Mobility/safety with mobility OT goals addressed during session: Proper use of Adaptive equipment and DME;ADL's and self-care      AM-PAC PT "6 Clicks" Mobility   Outcome Measure  Help needed turning from your back to your side while in a flat bed without using bedrails?: A Little Help needed moving from lying on your back to sitting on the side of a flat bed without using bedrails?: A Little Help needed moving to and from a bed to a chair (including a wheelchair)?: A Little Help needed standing up from a chair using your arms (e.g., wheelchair or bedside chair)?: A Little Help needed to walk in hospital room?: A Little Help needed climbing 3-5 steps with a railing? : A Little 6 Click Score: 18    End of Session   Activity Tolerance: Patient tolerated treatment well Patient left: in bed;with call bell/phone within reach;with family/visitor present Nurse Communication: Mobility status PT Visit Diagnosis: Other abnormalities of gait and mobility (R26.89);Difficulty in walking, not elsewhere classified (R26.2);Muscle weakness (generalized) (M62.81)     Time: 1437-1500 PT Time Calculation (min) (ACUTE ONLY): 23 min  Charges:    $Therapeutic Activity: 8-22 mins PT General Charges $$ ACUTE PT VISIT: 1 Visit                     Nolon Bussing, PT, DPT Physical Therapist - [facility]  03/21/24, 4:08 PM

## 2024-03-21 NOTE — Progress Notes (Signed)
 PROGRESS NOTE    Todd Rojas  EAV:[id number] DOB: 06/29/1941 DOA: 03/09/2024 PCP: Barbette Reichmann, MD    Brief Narrative:   Todd Rojas is an 83 year old male with history of CKD stage IIIa, chronic gout, hypertension, hyperlipidemia, interstitial pulmonary disease, bronchiectasis, COPD, who presents emergency department for chief concerns of fever, chills, shortness of breath for 2 weeks.   Vitals in the ED showed temperature of 98.4, respiration rate 25, heart rate 94, blood pressure initially 96/69 and improved to 107/66, SpO2 initially desatted to 88% on room air  Status post exploratory laparotomy with robotic assisted laparoscopic loop ileostomy and decompression of cecum on 4/2.  Tolerated procedure well.  No ischemic gut noted  Assessment & Plan:   Principal Problem:   Severe sepsis with acute organ dysfunction (HCC) Active Problems:   Fever   Acute hypoxic respiratory failure (HCC)   CAD (coronary artery disease)   Elevated troponin   Gaseous abdominal distention   COPD (chronic obstructive pulmonary disease) (HCC)   Elevated serum creatinine   Hypertension   Hyperlipidemia, unspecified   Primary osteoarthritis of both knees   Thyroid nodule greater than or equal to 1.5 cm in diameter incidentally noted on imaging study   Dyslipidemia   Personal history of gout   Ogilvie syndrome  Gaseous abdominal distention 2/2 Colonic ileus  Ogilvie's syndrome Patient developed abdominal distention and crampy abdominal pain after presentation. Starting 3/25, Imaging with right colonic gaseous distention without any obvious obstruction.  Colonic distention has been worsening. --GenSurg and GI consulted, dx with Ogilvie's syndrome --neostigmine 2 mg push morning of 3/29 with [name] and [name] present with passing of gas and BM afterwards, however, colonic distention unchanged, so pt underwent endoscopic decompression on 3/30.  Unfortunately colonic gas  re-accumulated. --started TPN on 3/31 Plan: Status post loop ileostomy creation 4/2.  Tolerated procedure well.  No ischemic gut noted in operating room.  Patient feels reaccumulating colonic distention.  General surgery following.  Repeat KUB ordered.  Full liquid diet and TPN for now.  Bradycardia, recurrent Initially noted secondary to neostigmine as administration.  Heart rate was down to 20s.  Patient received atropine.  Heart rate improved.  On 4/3 noted to be bradycardic, asymptomatic with heart rate in the 40s to 50s.  Beta-blockade has been held.   * Severe sepsis with acute organ dysfunction (HCC) 2/2 PNA Elevated lactic acid of 2.6, increased respiration rate, with fever of 102 F at home, organ involvement are cardiac and pulmonary with hypoxic respiratory failure at 88% on room air, elevated high sensitive troponin of 98 and elevated BNP --Imaging concerning for right lower lobe pneumonia, although not much upper respiratory symptoms.  Procalcitonin at 0.352. Respiratory panels were negative. UA does not consistent with UTI Patient received broad-spectrum antibiotics Completed antibiotic course Plan: Continue to monitor off antibiotics.  Monitor vitals and fever curve.  Repeat stat CT in 3 to 4 weeks.  Low threshold to restart antibiotic should any clinical indication of infection return.   Acute hypoxic respiratory failure (HCC) Currently on 2L --Continue supplemental O2 to keep sats >=92%, wean as tolerated   Elevated troponin 2/2 demand ischemia First high sensitivity troponin was elevated at 98 >>101>175>226,  Suspect secondary to demand ischemia in setting of severe sepsis with acute hypoxic respiratory failure Echocardiogram with EF of 40% and regional wall motion abnormalities, discussed with cardiology and they think these abnormalities are chronic. Patient was also experiencing intermittent chest pain over the past couple of  months which seems more  exertional --cardio consulted, no plan for cardiac workup   CAD (coronary artery disease) With history of anterior STEMI in 2006, transferred to [facility] status post cardiac catheterization with PCI to mid LAD using Cypher stent; currently no longer on Plavix Complete echo on 05/17/2022: Estimated ejection fraction is 55%, trivial mitral regurgitation, trivial tricuspid regurgitation, trivial pulmonary valve regurgitation; aortic valve has normal variant --cont home ASA and statin --cont Toprol   COPD (chronic obstructive pulmonary disease) (HCC) --cont home bronchodilator   Elevated serum creatinine Does not meet acute kidney injury, suspect prerenal in setting of nausea vomiting Serum creatinine baseline is 1.35-1.36/eGFR 52-55, consistent with CKD stage III A -Monitor renal function -Avoid nephrotoxins   Hypertension Blood pressure improving. Holding home furosemide and lisinopril Holding metoprolol secondary to bradycardia Will start amlodipine 5 mg daily, uptitrate as necessary    Hyperlipidemia, unspecified --cont home statin   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 Imaging was obtained which shows very mild right knee effusion and no effusion on left. --Voltaren gel and heat pad   Thyroid nodule greater than or equal to 1.5 cm in diameter incidentally noted on imaging study Incidental finding of a 2.7 cm thyroid nodule. -Thyroid ultrasound with solitary nodule which required outpatient biopsy --TSH wnl   Personal history of gout --cont home allopurinol   Rash Developed morning of 3/28, all over chest, back and bilateral thighs.  Appeared to be allergic rxn.  Rash improved with prednisone.  Completed 5 days of prednisone 40 mg daily.   DVT prophylaxis: Lovenox Code Status: Full Family Communication: Spouse at bedside 4/2 Disposition Plan: Status is: Inpatient Remains inpatient appropriate because: Multiple acute issues as  above   Level of care: Med-Surg  Consultants:  General surgery  Procedures:  Exploratory laparotomy with loop ileostomy  Antimicrobials: None   Subjective: Seen and examined.  More sleepy this morning.  No other complaints.  Objective: Vitals:   03/20/24 2042 03/21/24 0117 03/21/24 0452 03/21/24 0832  BP: (!) 154/69 (!) 140/63  (!) 141/70  Pulse:  (!) 54  (!) 50  Resp:  16  18  Temp:  97.8 F (36.6 C)  97.7 F (36.5 C)  TempSrc:  Oral    SpO2:  93%  98%  Weight:   97.1 kg   Height:        Intake/Output Summary (Last 24 hours) at 03/21/2024 1231 Last data filed at 03/21/2024 0900 Gross per 24 hour  Intake 1862.56 ml  Output 100 ml  Net 1762.56 ml   Filed Weights   03/19/24 0329 03/20/24 0318 03/21/24 0452  Weight: 96.7 kg 93.8 kg 97.1 kg    Examination:  General exam: NAD Respiratory system: Poor respiratory effort.  Bibasilar crackles.  Normal work of breathing.  3 L Cardiovascular system: S1-S2, RRR, no murmurs, no pedal edema Gastrointestinal system: Soft, laparotomy incision CDI, ileostomy Central nervous system: Alert and oriented. No focal neurological deficits. Extremities: Symmetric 5 x 5 power. Skin: No rashes, lesions or ulcers Psychiatry: Judgement and insight appear normal. Mood & affect appropriate.     Data Reviewed: I have personally reviewed following labs and imaging studies  CBC: Recent Labs  Lab 03/16/24 0547 03/17/24 0318 03/18/24 0457 03/20/24 0432  WBC 9.0 10.5 8.1 9.6  HGB 11.5* 11.1* 10.9* 11.1*  HCT 34.8* 33.3* 32.2* 33.2*  MCV 97.2 98.5 95.8 97.4  PLT 293 287 288 234   Basic Metabolic Panel: Recent Labs  Lab 03/16/24  1914 03/17/24 7829 03/18/24 0457 03/19/24 0535 03/19/24 0536 03/20/24 0432 03/21/24 0541  NA 132* 136 136  --  138 132* 135  K 4.6 4.1 4.7  --  4.4 4.7 5.2*  CL 98 101 102  --  102 100 102  CO2 26 25 26   --  29 26 26   GLUCOSE 147* 116* 154*  --  132* 151* 156*  BUN 34* 35* 44*  --  46* 47* 46*   CREATININE 1.25* 1.09 1.12  --  1.25* 0.98 1.07  CALCIUM 8.1* 8.0* 8.0*  --  8.2* 7.8* 8.0*  MG 2.5* 2.5*  --  2.7*  --  2.6* 2.5*  PHOS  --   --   --   --  4.5 4.2 4.8*   GFR: Estimated Creatinine Clearance: 60.2 mL/min (by C-G formula based on SCr of 1.07 mg/dL). Liver Function Tests: Recent Labs  Lab 03/16/24 0547 03/19/24 0536 03/20/24 0432 03/21/24 0541  AST 21  --  19 13*  ALT 20  --  21 16  ALKPHOS 38  --  28* 28*  BILITOT 0.6  --  0.4 0.4  PROT 6.5  --  5.7* 5.6*  ALBUMIN 2.1* 2.2* 2.3* 2.2*   No results for input(s): "LIPASE", "AMYLASE" in the last 168 hours. No results for input(s): "AMMONIA" in the last 168 hours. Coagulation Profile: No results for input(s): "INR", "PROTIME" in the last 168 hours. Cardiac Enzymes: No results for input(s): "CKTOTAL", "CKMB", "CKMBINDEX", "TROPONINI" in the last 168 hours. BNP (last 3 results) No results for input(s): "PROBNP" in the last 8760 hours. HbA1C: No results for input(s): "HGBA1C" in the last 72 hours. CBG: Recent Labs  Lab 03/20/24 0519 03/20/24 1814 03/20/24 2349 03/21/24 0519 03/21/24 1143  GLUCAP 152* 190* 203* 141* 145*   Lipid Profile: No results for input(s): "CHOL", "HDL", "LDLCALC", "TRIG", "CHOLHDL", "LDLDIRECT" in the last 72 hours. Thyroid Function Tests: No results for input(s): "TSH", "T4TOTAL", "FREET4", "T3FREE", "THYROIDAB" in the last 72 hours. Anemia Panel: No results for input(s): "VITAMINB12", "FOLATE", "FERRITIN", "TIBC", "IRON", "RETICCTPCT" in the last 72 hours. Sepsis Labs: No results for input(s): "PROCALCITON", "LATICACIDVEN" in the last 168 hours.  Recent Results (from the past 240 hours)  MRSA Next Gen by PCR, Nasal     Status: None   Collection Time: 03/15/24  7:11 PM   Specimen: Nasal Mucosa; Nasal Swab  Result Value Ref Range Status   MRSA by PCR Next Gen NOT DETECTED NOT DETECTED Final    Comment: (NOTE) The GeneXpert MRSA Assay (FDA approved for NASAL specimens only), is  one component of a comprehensive MRSA colonization surveillance program. It is not intended to diagnose MRSA infection nor to guide or monitor treatment for MRSA infections. Test performance is not FDA approved in patients less than 41 years old. Performed at [facility], [address] [place], [place] [address]          Radiology Studies: DG ABD ACUTE 2+V W 1V CHEST Result Date: 03/21/2024 CLINICAL DATA:  Ogilvie syndrome. EXAM: DG ABDOMEN ACUTE WITH 1 VIEW CHEST COMPARISON:  Abdominal radiograph dated 03/20/2024. FINDINGS: Evaluation is limited due to body habitus. Right-sided PICC in similar position. Mild eventration of the right hemidiaphragm. Small right pleural effusion and right lung base atelectasis. No pneumothorax. Stable cardiomediastinal silhouette. Mild air distention of the stomach. No bowel dilatation or evidence of obstruction. Contrast noted throughout the colon. No free air. The osseous structures are intact. The soft tissues are unremarkable.  IMPRESSION: 1. Small right pleural effusion and right lung base atelectasis. 2. Nonobstructive bowel gas pattern. Electronically Signed   By: Elgie Collard M.D.   On: 03/21/2024 12:10   DG ABD ACUTE 2+V W 1V CHEST Result Date: 03/20/2024 CLINICAL DATA:  Over the syndrome. EXAM: DG ABDOMEN ACUTE WITH 1 VIEW CHEST COMPARISON:  03/19/2024 FINDINGS: Asymmetric elevation right hemidiaphragm. The cardio pericardial silhouette is enlarged. There is pulmonary vascular congestion without overt pulmonary edema. Right PICC line tip overlies the lower SVC level. Upright film shows no evidence for intraperitoneal free air. Interval decrease in the marked gaseous distention of the stomach. The gaseous dilatation of the right and transverse colon has also decreased slightly in the interval since the previous study. No gaseous small bowel dilatation on the current study to suggest small bowel obstruction. IMPRESSION: 1. Interval decrease in  the marked gaseous distention of the stomach. 2. Interval decrease in the gaseous dilatation of the right and transverse colon. 3. No evidence for intraperitoneal free air. 4. Enlargement of the cardiopericardial silhouette with pulmonary vascular congestion. Electronically Signed   By: Kennith Center M.D.   On: 03/20/2024 11:13        Scheduled Meds:  acetaminophen  1,000 mg Oral Q6H   allopurinol  300 mg Oral Daily   amLODipine  5 mg Oral Daily   aspirin EC  81 mg Oral Daily   Chlorhexidine Gluconate Cloth  6 each Topical Daily   diclofenac Sodium  2 g Topical TID   enoxaparin (LOVENOX) injection  45 mg Subcutaneous Q24H   feeding supplement  237 mL Oral TID BM   insulin aspart  0-9 Units Subcutaneous Q6H   ketorolac  15 mg Intravenous Q6H   mometasone-formoterol  2 puff Inhalation BID   pantoprazole  40 mg Oral Daily   pregabalin  50 mg Oral TID   sodium chloride flush  10-40 mL Intracatheter Q12H   thiamine (VITAMIN B1) injection  100 mg Intravenous Daily   Or   thiamine  100 mg Oral Daily   Continuous Infusions:  cefoTEtan (CEFOTAN) IV     TPN (CLINIMIX-E) Adult     And   fat emul(SMOFlipid)     TPN (CLINIMIX-E) Adult 42 mL/hr at 03/21/24 1610     LOS: 12 days    Tresa Moore, MD [facility]   If 7PM-7AM, please contact night-coverage  03/21/2024, 12:31 PM

## 2024-03-21 NOTE — Evaluation (Signed)
 Occupational Therapy Evaluation Patient Details Name: Todd Rojas MRN: [id number] DOB: 08-01-1941 Today's Date: 03/21/2024   History of Present Illness   Pt is an 83 year old male   MD work up including sepsis, acute hypoxic respiratory failure, PNA    Pmh significant for CKD stage IIIa, chronic gout, hypertension, hyperlipidemia, interstitial pulmonary disease, bronchiectasis, COPD. Pt is s/p exploratory lapileostomy and decompression of cecum on 4/2.     Clinical Impressions Pt seen for re-evaluation after havin exploratory lapileostomy and decompression of cecum on 4/2. Pt's supportive wife present in room. He is agreeable to therapeutic intervention. Min A for bed mobility and stands with min A and use of RW. Pt needing assistance to don B socks and with reports of discomfort/soreness in abdomen. Pt ambulating in hallway with RW and supervision. He was able to be placed on RA during session.He briefly drops to 87% after mobility but quickly returns to 93% + with cues for pursed lip breathing. Pt making excellent progress towards goals and discharge plan upgraded to reflect progress.      If plan is discharge home, recommend the following:   A little help with walking and/or transfers;A little help with bathing/dressing/bathroom;Assistance with cooking/housework;Assist for transportation;Help with stairs or ramp for entrance      Equipment Recommendations   BSC/3in1;Tub/shower seat      Precautions/Restrictions   Precautions Precautions: Fall Recall of Precautions/Restrictions: Intact Restrictions Weight Bearing Restrictions Per Provider Order: No     Mobility Bed Mobility Overal bed mobility: Needs Assistance Bed Mobility: Supine to Sit, Rolling Rolling: Supervision   Supine to sit: HOB elevated, Contact guard Sit to supine: Min assist        Transfers Overall transfer level: Needs assistance Equipment used: Rolling walker (2 wheels) Transfers: Sit  to/from Stand Sit to Stand: Min assist                  Balance Overall balance assessment: Needs assistance Sitting-balance support: Feet supported, Bilateral upper extremity supported Sitting balance-Leahy Scale: Good     Standing balance support: Bilateral upper extremity supported, Reliant on assistive device for balance Standing balance-Leahy Scale: Fair                             ADL either performed or assessed with clinical judgement   ADL Overall ADL's : Needs assistance/impaired                                       General ADL Comments: assistance for LB self care secondary to discomfort     Vision Patient Visual Report: No change from baseline              Pertinent Vitals/Pain Pain Assessment Pain Assessment: Faces Faces Pain Scale: Hurts a little bit Pain Location: abdomen Pain Descriptors / Indicators: Sore, Discomfort Pain Intervention(s): Limited activity within patient's tolerance, Repositioned, Monitored during session     Extremity/Trunk Assessment Upper Extremity Assessment Upper Extremity Assessment: Generalized weakness   Lower Extremity Assessment Lower Extremity Assessment: Generalized weakness       Communication     Cognition Arousal: Alert Behavior During Therapy: WFL for tasks assessed/performed Cognition: No apparent impairments  OT Goals(Current goals can be found in the care plan section)   Acute Rehab OT Goals Time For Goal Achievement: 04/04/24   OT Frequency:  Min 2X/week    Co-evaluation   Reason for Co-Treatment: Complexity of the patient's impairments (multi-system involvement) PT goals addressed during session: Mobility/safety with mobility OT goals addressed during session: Proper use of Adaptive equipment and DME;ADL's and self-care      AM-PAC OT "6 Clicks" Daily Activity     Outcome Measure Help from another  person eating meals?: None Help from another person taking care of personal grooming?: A Little Help from another person toileting, which includes using toliet, bedpan, or urinal?: A Lot Help from another person bathing (including washing, rinsing, drying)?: A Lot Help from another person to put on and taking off regular upper body clothing?: A Little Help from another person to put on and taking off regular lower body clothing?: A Lot 6 Click Score: 16   End of Session Equipment Utilized During Treatment: Rolling walker (2 wheels);Oxygen Nurse Communication: Mobility status  Activity Tolerance: Patient tolerated treatment well Patient left: with call bell/phone within reach;in bed;with bed alarm set;with family/visitor present  OT Visit Diagnosis: Other abnormalities of gait and mobility (R26.89)                Time: 1437-1500 OT Time Calculation (min): 23 min Charges:  OT General Charges $OT Visit: 1 Visit OT Evaluation $OT Re-eval: 1 Re-eval  Jackquline Denmark, MS, OTR/L , CBIS ascom [phone]  03/21/24, 4:15 PM

## 2024-03-21 NOTE — Consult Note (Addendum)
 WOC Nurse ostomy consult note Pt had ileostomy surgery performed yesterday. Wife at bedside for teaching session.  Stoma is red and viable, 1 3/4 inches, slightly above skin level.   40cc liquid brown stool in the pouch.  Wife assisted with pouch change while patient watched using a hand held mirror.  She was able to stretch and apply the barrier ring to the back of the pouch and apply the pouch.  She and the Pt were able to open and close the velcro to empty.  Educational materials left at the bedside, and ordered 5 sets of supplies for staff nurse use.  Use supplies: barrier ring, Lawson # [phone] and pouch Lawson # [phone].  Reviewed pouching routines and ordering supplies.  Pt plans to go to a short term rehab facility after discharge, according to his wife, where he can receive further teaching.  Enrolled patient in [employer] DC program: Yes, today Thank-you,  Cammie Mcgee MSN, RN, [place], [place], [place] [phone]

## 2024-03-21 NOTE — Anesthesia Postprocedure Evaluation (Signed)
 Anesthesia Post Note  Patient: Todd Rojas  Procedure(s) Performed: LAPAROSCOPY, DIAGNOSTIC, ROBOT-ASSISTED (Abdomen) CREATION, ILEOSTOMY, DIVERTING, ROBOT-ASSISTED (Abdomen)  Patient location during evaluation: PACU Anesthesia Type: General Level of consciousness: awake and alert Pain management: pain level controlled Vital Signs Assessment: post-procedure vital signs reviewed and stable Respiratory status: spontaneous breathing, nonlabored ventilation, respiratory function stable and patient connected to nasal cannula oxygen Cardiovascular status: blood pressure returned to baseline and stable Postop Assessment: no apparent nausea or vomiting Anesthetic complications: no   There were no known notable events for this encounter.   Last Vitals:  Vitals:   03/20/24 2042 03/21/24 0117  BP: (!) 154/69 (!) 140/63  Pulse:  (!) 54  Resp:  16  Temp:  36.6 C  SpO2:  93%    Last Pain:  Vitals:   03/21/24 0544  TempSrc:   PainSc: 0-No pain                 Yevette Edwards

## 2024-03-21 NOTE — Progress Notes (Signed)
 PHARMACY - TOTAL PARENTERAL NUTRITION CONSULT NOTE   Indication: Ogilvie's syndrome  Patient Measurements: Height: 5\' 9"  (175.3 cm) Weight: 97.1 kg (214 lb 1.1 oz) IBW/kg (Calculated) : 70.7 TPN AdjBW (KG): 77.3 Body mass index is 31.61 kg/m.  Assessment: 83 y.o. 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   Glucose / Insulin:  --BG controlled --8u SSI last 24h Electrolytes: Electrolytes slightly elevated today. Mild hyperkalemia, hyperphosphatemia, hypermagnesemia Renal: SCr ~ 1. UOP not being documented strictly Hepatic: LFTs within normal limits Intake / Output; I&O not being strictly documented MIVF: N/A GI Imaging:Serial KUB prn --No new pertinent imaging studies  GI Surgeries / Procedures:  4/2- for surgery today-potentially involve subtotal colectomy and end ileostomy creation.   Central access: 03/18/24 TPN start date: 03/18/24 Estimated Needs Total Energy Estimated Needs: 1900-2200kcal/day Total Protein Estimated Needs: 95-110g/day Total Fluid Estimated Needs: 1.9-2.2L/day  Nutritional Goals: Recommend 1000 mL premix Clinimix E 8/10 bag 4x/week and 2000 mL Clinimix bag 3x/week with SMOF lipids daily - regimen would provide 1447 average kcal/day and 114 gm of average protein/day  Current Nutrition:  Full liquid diet  Plan:  ---Continue Clinimix E 8/10 1 L over 24h + SMOFlipid 20% 250 mL over 12h at 1800 ---Electrolytes in TPN: standard per Clinimix E 8/10 formulation (contains 30 mEq K+) K 5.2 today, discussed with provider. Give Lasix 20 mg IV x 1 and re-check K+ at 1500 ---Add standard MVI and trace elements to TPN, thiamine ordered outside of TPN ---Initiate Sensitive q6h SSI and adjust as needed  ---Monitor TPN labs daily until stable then on Mon/Thurs, re-check BMP  tomorrow AM  Tressie Ellis 03/21/2024

## 2024-03-21 NOTE — Progress Notes (Signed)
 [facility] SURGICAL PROGRESS NOTE  Hospital Day(s): 12.   Post op day(s): 1 Day Post-Op.   Interval History:  Patient seen and examined No acute events or new complaints overnight.  Patient reports he is feeling better this AM No feer, chills, emesis He is bradycardic this morning between 40-59 Renal function normal; sCr - 1.07: UO - 90 + unmeasured amounts BUN 46; stable Hyperkalemia to 5.2, Hypermagnesemia to 2.5, Hyperphosphatemia to 4.8 Ileostomy without output yet  Vital signs in last 24 hours: [min-max] current  Temp:  [97.1 F (36.2 C)-97.8 F (36.6 C)] 97.8 F (36.6 C) (04/03 0117) Pulse Rate:  [46-70] 54 (04/03 0117) Resp:  [13-21] 16 (04/03 0117) BP: (131-176)/(63-75) 140/63 (04/03 0117) SpO2:  [91 %-98 %] 93 % (04/03 0117) Weight:  [97.1 kg] 97.1 kg (04/03 0452)     Height: 5\' 9"  (175.3 cm) Weight: 97.1 kg BMI (Calculated): 31.6   Intake/Output last 2 shifts:  04/02 0701 - 04/03 0700 In: 2325.9 [P.O.:50; I.V.:2075.9; IV Piggyback:200] Out: 240 [Urine:90; Stool:50]   Physical Exam:  Constitutional: alert, cooperative and no distress  Respiratory: breathing non-labored at rest  Cardiovascular: bradycardic and sinus rhythm  Gastrointestinal: soft, non-tender, distended and tympanic, no rebound/guarding. Loop ileostomy in RLQ; dusky, minimal bowel sweat in bag Integumentary: Laparoscopic incisions are CDI with dermabond, no erythema or drainage   Labs:     Latest Ref Rng & Units 03/20/2024    4:32 AM 03/18/2024    4:57 AM 03/17/2024    3:18 AM  CBC  WBC 4.0 - 10.5 K/uL 9.6  8.1  10.5   Hemoglobin 13.0 - 17.0 g/dL 60.4  54.0  98.1   Hematocrit 39.0 - 52.0 % 33.2  32.2  33.3   Platelets 150 - 400 K/uL 234  288  287       Latest Ref Rng & Units 03/21/2024    5:41 AM 03/20/2024    4:32 AM 03/19/2024    5:36 AM  CMP  Glucose 70 - 99 mg/dL 191  478  295   BUN 8 - 23 mg/dL 46  47  46   Creatinine 0.61 - 1.24 mg/dL 6.21  3.08  6.57   Sodium 135 -  145 mmol/L 135  132  138   Potassium 3.5 - 5.1 mmol/L 5.2  4.7  4.4   Chloride 98 - 111 mmol/L 102  100  102   CO2 22 - 32 mmol/L 26  26  29    Calcium 8.9 - 10.3 mg/dL 8.0  7.8  8.2   Total Protein 6.5 - 8.1 g/dL 5.6  5.7    Total Bilirubin 0.0 - 1.2 mg/dL 0.4  0.4    Alkaline Phos 38 - 126 U/L 28  28    AST 15 - 41 U/L 13  19    ALT 0 - 44 U/L 16  21       Imaging studies: No new pertinent imaging studies   Assessment/Plan:  83 y.o. 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   - We can advance to FLD  - Will DC metoprolol this AM given bradycardia   - Continue TPN; advance to goal; monitor electrolytes - I do not think we can utilize gut reliably at this time.              - We  can discontinue rectal tube    - Engage WOC RN for ostomy care/teaching             - Monitor abdominal examination             - Serial KUB as needed             - Pain control prn; minimize narcotics as feasible - Antiemetics prn - Needs to mobilize; therapies on board              - Further management per primary service; we will follow   All of the above findings and recommendations were discussed with the patient, patient's family (wife at bedside), and the medical team, and all of patient's and family's questions were answered to their expressed satisfaction.  -- Lynden Oxford, PA-C  [facility] 03/21/2024, 7:07 AM M-F: 7am - 4pm

## 2024-03-21 NOTE — Plan of Care (Signed)

## 2024-03-22 DIAGNOSIS — A419 Sepsis, unspecified organism: Secondary | ICD-10-CM | POA: Diagnosis not present

## 2024-03-22 DIAGNOSIS — R6520 Severe sepsis without septic shock: Secondary | ICD-10-CM | POA: Diagnosis not present

## 2024-03-22 LAB — BASIC METABOLIC PANEL WITH GFR
Anion gap: 5 (ref 5–15)
BUN: 40 mg/dL — ABNORMAL HIGH (ref 8–23)
CO2: 27 mmol/L (ref 22–32)
Calcium: 7.9 mg/dL — ABNORMAL LOW (ref 8.9–10.3)
Chloride: 101 mmol/L (ref 98–111)
Creatinine, Ser: 1.08 mg/dL (ref 0.61–1.24)
GFR, Estimated: >60 mL/min (ref >60)
Glucose, Bld: 106 mg/dL — ABNORMAL HIGH (ref 70–99)
Potassium: 4.8 mmol/L (ref 3.5–5.1)
Sodium: 133 mmol/L — ABNORMAL LOW (ref 135–145)

## 2024-03-22 LAB — CBC
HCT: 36.2 % — ABNORMAL LOW (ref 39.0–52.0)
Hemoglobin: 12.1 g/dL — ABNORMAL LOW (ref 13.0–17.0)
MCH: 32.3 pg (ref 26.0–34.0)
MCHC: 33.4 g/dL (ref 30.0–36.0)
MCV: 96.5 fL (ref 80.0–100.0)
Platelets: 236 10*3/uL (ref 150–400)
RBC: 3.75 MIL/uL — ABNORMAL LOW (ref 4.22–5.81)
RDW: 13.8 % (ref 11.5–15.5)
WBC: 12.6 10*3/uL — ABNORMAL HIGH (ref 4.0–10.5)
nRBC: 0 % (ref 0.0–0.2)

## 2024-03-22 LAB — GLUCOSE, CAPILLARY
Glucose-Capillary: 100 mg/dL — ABNORMAL HIGH (ref 70–99)
Glucose-Capillary: 106 mg/dL — ABNORMAL HIGH (ref 70–99)
Glucose-Capillary: 132 mg/dL — ABNORMAL HIGH (ref 70–99)
Glucose-Capillary: 139 mg/dL — ABNORMAL HIGH (ref 70–99)
Glucose-Capillary: 143 mg/dL — ABNORMAL HIGH (ref 70–99)

## 2024-03-22 MED ORDER — METOPROLOL TARTRATE 25 MG PO TABS
12.5000 mg | ORAL_TABLET | Freq: Two times a day (BID) | ORAL | Status: DC
Start: 2024-03-22 — End: 2024-03-22

## 2024-03-22 MED ORDER — NEPRO/CARBSTEADY PO LIQD
237.0000 mL | Freq: Three times a day (TID) | ORAL | Status: DC
  Administered 2024-03-22 – 2024-03-24 (×2): 237 mL via ORAL

## 2024-03-22 MED ORDER — ADULT MULTIVITAMIN W/MINERALS CH
1.0000 | ORAL_TABLET | Freq: Every day | ORAL | Status: DC
  Administered 2024-03-23 – 2024-03-24 (×2): 1 via ORAL
  Filled 2024-03-22 (×3): qty 1

## 2024-03-22 MED ORDER — LOPERAMIDE HCL 2 MG PO CAPS
2.0000 mg | ORAL_CAPSULE | Freq: Three times a day (TID) | ORAL | Status: DC
  Administered 2024-03-22 – 2024-03-24 (×8): 2 mg via ORAL
  Filled 2024-03-22 (×8): qty 1

## 2024-03-22 MED ORDER — FAMOTIDINE 20 MG PO TABS
20.0000 mg | ORAL_TABLET | Freq: Two times a day (BID) | ORAL | Status: DC
  Administered 2024-03-22 – 2024-03-24 (×5): 20 mg via ORAL
  Filled 2024-03-22 (×5): qty 1

## 2024-03-22 MED ORDER — FAT EMUL FISH OIL/PLANT BASED 20% (SMOFLIPID)IV EMUL
250.0000 mL | INTRAVENOUS | Status: AC
  Administered 2024-03-22: 250 mL via INTRAVENOUS
  Filled 2024-03-22: qty 250

## 2024-03-22 MED ORDER — METOPROLOL TARTRATE 5 MG/5ML IV SOLN: 5.0000 mg | INTRAVENOUS | Status: DC | PRN

## 2024-03-22 MED ORDER — TRACE MINERALS CU-MN-SE-ZN 300-55-60-3000 MCG/ML IV SOLN
INTRAVENOUS | Status: AC
  Filled 2024-03-22: qty 1000

## 2024-03-22 NOTE — Progress Notes (Signed)
 [facility] SURGICAL PROGRESS NOTE  Hospital Day(s): 13.   Post op day(s): 2 Days Post-Op.   Interval History:  Patient seen and examined No acute events or new complaints overnight.  Patient reports he is feeling good Minimal abdominal soreness Distension certainly improved No feer, chills, emesis Slight leukocytosis this AM to 12.6K - most likely reactive from OR Hgb to 12.1 - improved Renal function normal; sCr - 1.08: UO - 400 ccs + unmeasured amounts Ileostomy with 100 ccs recorded - full this AM and RN at bedside to empty FLD; reports doesn't taste good   Vital signs in last 24 hours: [min-max] current  Temp:  [97.7 F (36.5 C)-98.1 F (36.7 C)] 98.1 F (36.7 C) (04/04 0258) Pulse Rate:  [49-60] 60 (04/04 0258) Resp:  [16-18] 16 (04/04 0258) BP: (122-141)/(58-71) 138/71 (04/04 0258) SpO2:  [93 %-98 %] 93 % (04/04 0258) Weight:  [92.2 kg] 92.2 kg (04/04 0500)     Height: 5\' 9"  (175.3 cm) Weight: 92.2 kg BMI (Calculated): 29.99   Intake/Output last 2 shifts:  04/03 0701 - 04/04 0700 In: 120 [P.O.:120] Out: 500 [Urine:400; Stool:100]   Physical Exam:  Constitutional: alert, cooperative and no distress  Respiratory: breathing non-labored at rest  Cardiovascular: bradycardic and sinus rhythm  Gastrointestinal: soft, non-tender, distension improving, no rebound/guarding. Loop ileostomy in RLQ; dusky, liquid stool in bag Integumentary: Laparoscopic incisions are CDI with dermabond, no erythema or drainage   Labs:     Latest Ref Rng & Units 03/22/2024    6:05 AM 03/20/2024    4:32 AM 03/18/2024    4:57 AM  CBC  WBC 4.0 - 10.5 K/uL 12.6  9.6  8.1   Hemoglobin 13.0 - 17.0 g/dL 16.1  09.6  04.5   Hematocrit 39.0 - 52.0 % 36.2  33.2  32.2   Platelets 150 - 400 K/uL 236  234  288       Latest Ref Rng & Units 03/22/2024    6:05 AM 03/21/2024    3:09 PM 03/21/2024    5:41 AM  CMP  Glucose 70 - 99 mg/dL 409   811   BUN 8 - 23 mg/dL 40   46   Creatinine 9.14  - 1.24 mg/dL 7.82   9.56   Sodium 213 - 145 mmol/L 133   135   Potassium 3.5 - 5.1 mmol/L 4.8  4.8  5.2   Chloride 98 - 111 mmol/L 101   102   CO2 22 - 32 mmol/L 27   26   Calcium 8.9 - 10.3 mg/dL 7.9   8.0   Total Protein 6.5 - 8.1 g/dL   5.6   Total Bilirubin 0.0 - 1.2 mg/dL   0.4   Alkaline Phos 38 - 126 U/L   28   AST 15 - 41 U/L   13   ALT 0 - 44 U/L   16      Imaging studies: No new pertinent imaging studies   Assessment/Plan:  83 y.o. 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   - We will advance to soft diet   - Continue TPN; if he tolerates PO, we can stop this in next 24 hours   - WOC RN for ostomy care/teaching             - Monitor abdominal examination  -  Monitor ileostomy output; high risk of dehydration, start anti-diarrheal if needed (output >1 - 1.5L)             - Serial KUB as needed             - Pain control prn; minimize narcotics as feasible - Antiemetics prn - Needs to mobilize; therapies on board              - Further management per primary service; we will follow    - Discharge Planning: Doing better from abdominal standpoint, diet advancing. Likely benefit from 24-48 hours monitoring to ensure tolerates diet with adequate ileostomy function before DC  All of the above findings and recommendations were discussed with the patient, patient's family (wife at bedside), and the medical team, and all of patient's and family's questions were answered to their expressed satisfaction.  -- Lynden Oxford, PA-C  [facility] 03/22/2024, 7:20 AM M-F: 7am - 4pm

## 2024-03-22 NOTE — Progress Notes (Signed)
 Physical Therapy Treatment Patient Details Name: Todd Rojas MRN: [id number] DOB: [birth year] Today's Date: 03/22/2024   History of Present Illness Pt is an 83 year old male   MD work up including sepsis, acute hypoxic respiratory failure, PNA    Pmh significant for CKD stage IIIa, chronic gout, hypertension, hyperlipidemia, interstitial pulmonary disease, bronchiectasis, COPD. Pt is s/p exploratory lapileostomy and decompression of cecum on 4/2.    PT Comments  Patient is agreeable to PT session. He had just gotten out of bed to chair with staff assistance. The patient is slow but steady with hallway ambulation using rolling walker. No dyspnea noted and no dizziness reported with activity. Recommend to continue using rolling walker for safety and fall prevention. Recommend to continue PT to maximize independence and decrease caregiver burden.    If plan is discharge home, recommend the following: Assist for transportation;Help with stairs or ramp for entrance;A little help with bathing/dressing/bathroom;A little help with walking and/or transfers;Assistance with cooking/housework   Can travel by private vehicle     Yes  Equipment Recommendations  Rolling walker (2 wheels);BSC/3in1    Recommendations for Other Services       Precautions / Restrictions Precautions Precautions: Fall Recall of Precautions/Restrictions: Intact Restrictions Weight Bearing Restrictions Per Provider Order: No     Mobility  Bed Mobility               General bed mobility comments: not assessed as patient sitting up on arrival and post session    Transfers Overall transfer level: Needs assistance Equipment used: Rolling walker (2 wheels) Transfers: Sit to/from Stand Sit to Stand: Contact guard assist           General transfer comment: patient standing with nurse tech on arrival to room. stand to sit with CGA with good safety awareness    Ambulation/Gait Ambulation/Gait  assistance: Contact guard assist Gait Distance (Feet): 110 Feet Assistive device: Rolling walker (2 wheels) Gait Pattern/deviations: Decreased step length - right, Step-through pattern Gait velocity: decreased     General Gait Details: slow but steady with hallway ambulation using rolling walker. encouraged patient to continue using rolling walker (he has one at home) for safety and fall prevention   Stairs             Wheelchair Mobility     Tilt Bed    Modified Rankin (Stroke Patients Only)       Balance Overall balance assessment: Needs assistance Sitting-balance support: Feet supported, Bilateral upper extremity supported Sitting balance-Leahy Scale: Good     Standing balance support: Bilateral upper extremity supported, Reliant on assistive device for balance Standing balance-Leahy Scale: Fair Standing balance comment: using rolling walker for support in standing                            Communication Communication Communication: Impaired  Cognition Arousal: Alert Behavior During Therapy: WFL for tasks assessed/performed   PT - Cognitive impairments: No apparent impairments                         Following commands: Intact Following commands impaired: Follows one step commands with increased time    Cueing Cueing Techniques: Verbal cues, Tactile cues  Exercises      General Comments        Pertinent Vitals/Pain Pain Assessment Pain Assessment: No/denies pain    Home Living  Prior Function            PT Goals (current goals can now be found in the care plan section) Acute Rehab PT Goals Patient Stated Goal: to feel better PT Goal Formulation: With patient Time For Goal Achievement: 04/04/24 Potential to Achieve Goals: Good Progress towards PT goals: Progressing toward goals    Frequency    Min 2X/week      PT Plan      Co-evaluation              AM-PAC PT "6  Clicks" Mobility   Outcome Measure  Help needed turning from your back to your side while in a flat bed without using bedrails?: A Little Help needed moving from lying on your back to sitting on the side of a flat bed without using bedrails?: A Little Help needed moving to and from a bed to a chair (including a wheelchair)?: A Little Help needed standing up from a chair using your arms (e.g., wheelchair or bedside chair)?: A Little Help needed to walk in hospital room?: A Little Help needed climbing 3-5 steps with a railing? : A Little 6 Click Score: 18    End of Session         PT Visit Diagnosis: Other abnormalities of gait and mobility (R26.89);Difficulty in walking, not elsewhere classified (R26.2);Muscle weakness (generalized) (M62.81)     Time: 1032-1050 PT Time Calculation (min) (ACUTE ONLY): 18 min  Charges:    $Therapeutic Activity: 8-22 mins PT General Charges $$ ACUTE PT VISIT: 1 Visit                    Donna Bernard, PT, MPT    Ina Homes 03/22/2024, 10:56 AM

## 2024-03-22 NOTE — Discharge Instructions (Signed)
 Colostomy Surgery, Adult, Care After The following information offers guidance on how to care for yourself after your procedure. Your health care provider may also give you more specific instructions. If you have problems or questions, contact your health care provider. What can I expect after the procedure? After the procedure, it is common to have: Swelling at the opening in the abdomen that was created during the procedure (stoma). Slight bleeding around the stoma. Redness around the stoma. Some discomfort at the surgical site. You will have a colostomy bag, or pouch, in place. The bag will be attached to your abdomen with adhesive and will be placed around your stoma. Stool and waste will come out through the stoma and into the bag. Follow these instructions at home: Activity Rest as needed while the stoma area heals. Return to your normal activities as told by your health care provider. Ask your health care provider what activities are safe for you. Avoid activities that take a lot of effort and any abdominal exercises for 3 weeks or for as long as told by your health care provider. You may have to avoid lifting. Ask your health care provider how much you can safely lift. Incision care Follow instructions from your health care provider about how to take care of your incision. Make sure you: Wash your hands with soap and water for at least 20 seconds before and after you change your bandage (dressing). If soap and water are not available, use hand sanitizer. Change your dressing as told by your health care provider. Leave stitches (sutures), skin glue, or adhesive strips in place. These skin closures may need to stay in place for 2 weeks or longer. If adhesive strip edges start to loosen and curl up, you may trim the loose edges. Do not remove adhesive strips completely unless your health care provider tells you to do that. Stoma care Keep the stoma area clean. Clean and dry the skin around  the stoma each time you change the colostomy bag. To clean the stoma area: Remove any adhesive residue using approved "ostomy" safe products only. These products are specially designed and safe for your skin and stoma. Use warm water and only use cleansers that are recommended by your health care provider. Rinse the stoma area with plain water. Dry the area well. Use stoma powder or skin barrier film on your skin only as told by your health care provider. Do not use any other powders, gels, wipes, or creams on the skin around the stoma. Check the stoma area every day for signs of infection. Check for: More redness, swelling, or pain. More fluid or blood. Pus or warmth. Measure the stoma opening regularly and record the size. Watch for changes. Share this information with your health care provider. Watch for other changes in the appearance of the stoma. The stoma should be moist, slightly raised above skin level, and red or pink in color. Colostomy bag care Follow instructions from your health care provider about how to empty or change the colostomy bag. Keep colostomy supplies with you at all times. Store all supplies in a cool, dry place. Empty the colostomy bag: Whenever it is one-third to one-half full. At bedtime. Replace the bag every 3-4 days for the first 6 weeks, then every 4-7 days. The bag should also be changed if the colostomy is leaking. Do not just reinforce the edges, because this may lead to skin damage. Bathing Do not take baths, swim, or use a hot tub until  your health care provider approves. Ask your health care provider if you may take showers. You may be able to shower with or without the colostomy bag in place. If you bathe with the bag on, dry the bag afterward. Avoid letting your colostomy bag get wet just after changing it. After changing the bag, wait at least 4-5 hours before letting the bag be exposed to water. This will allow the adhesive on the bag to fully take  hold. Avoid using harsh or oily soaps when you bathe. Avoid applying moisturizers or lotions to the skin around the stoma. Driving Follow driving restrictions as told by your health care provider. Ask your health care provider if the medicine prescribed to you requires you to avoid driving or using machinery. General instructions Follow instructions from your health care provider about eating or drinking restrictions. Take over-the-counter and prescription medicines only as told by your health care provider. Avoid wearing clothes that are tight directly over your stoma area. Do not use any products that contain nicotine or tobacco. These products include cigarettes, chewing tobacco, and vaping devices, such as e-cigarettes. If you need help quitting, ask your health care provider. If you are a woman, ask your health care provider about becoming pregnant and about using birth control. Medicines may not be absorbed normally after the procedure. Keep all follow-up visits and home care visits. This is important. Contact a health care provider if: You have trouble caring for your stoma or changing the colostomy bag. You are nauseous or vomiting. You have any of these signs of infection: More redness, swelling, or pain at the site of your stoma or around your anus. More fluid or blood coming from your stoma or your anus. Warmth around your stoma area. Pus coming from your stoma. A fever. You have a change in the size or appearance of the stoma. You have abdominal pain, bloating, pressure, or cramping. You have stool more often or less often than your health care provider tells you to expect. You produce very little urine. This may be a sign of dehydration. You have anxiety or concerns about your colostomy. Get help right away if: You have abdominal pain that does not go away or becomes severe. You are vomiting frequently. No stool is draining through the stoma. You have chest pain or an  irregular heartbeat. These symptoms may be an emergency. Get help right away. Call 911. Do not wait to see if the symptoms will go away. Do not drive yourself to the hospital. Summary Follow instructions from your health care provider about how to take care of your incision, stoma, and skin around it. Follow instructions from your health care provider about how to empty or change your colostomy bag. Contact a health care provider if you have trouble caring for your stoma or changing the colostomy bag, if there are problems with the skin around the stoma, or if you have any anxiety or concerns about your colostomy. Get help right away if you have abdominal pain that does not go away or becomes severe or if you have no stool draining through the stoma. Keep all follow-up visits and home care visits. This is important. This information is not intended to replace advice given to you by your health care provider. Make sure you discuss any questions you have with your health care provider. Document Revised: 07/28/2021 Document Reviewed: 07/28/2021 Elsevier Patient Education  2024 [employer].

## 2024-03-22 NOTE — Progress Notes (Signed)
 Nutrition Follow Up Note   DOCUMENTATION CODES:   Not applicable  INTERVENTION:   Continue TPN per pharmacy- currently weaning with plans to discontinue tomorrow if patient is tolerating diet   Daily weights   Nepro Shake po TID, each supplement provides 425 kcal and 19 grams protein  Magic cup TID with meals, each supplement provides 290 kcal and 9 grams of protein  MVI po daily   NUTRITION DIAGNOSIS:   Inadequate oral intake related to altered GI function as evidenced by other (comment) (pt on NPO/clear liquid diet since admission). -progressing   GOAL:   Patient will meet greater than or equal to 90% of their needs -met   MONITOR:   PO intake, Supplement acceptance, Diet advancement, Labs, Weight trends, Skin, I & O's, Other (Comment) (TPN)  ASSESSMENT:   83 y/o male with h/o CAD, COPD, HLD, gout, DDD, GERD, CHF, thyroid nodule, kidney stones, B12 deficiency and CKD III who is admitted with PNA, sepsis and Ogilvie syndrome s/p colonic decompression 3/30.  -Pt s/p s/p robotic assisted laparoscopic loop ileostomy with decompression of cecum 4/2  Met with pt and pt's wife in room today. Pt sleeping so history provided by wife. Per wife, pt has been sleeping for most of the day. Pt did eat a few bites of fish and drank 50% of an Ensure for lunch. Wife reports that pt did not really like the Ensure and reports he had some nausea after drinking it. Pt is tolerating TPN at half rate. Plan is to discontinue tomorrow if patient is tolerating diet. RD discussed with wife the importance of adequate nutrition needed to preserve lean muscle and support post op healing. Will try Nepro supplements as wife reports that patient enjoys butter pecan. RD will add Magic Cups on meal trays. Encouraged wife to try and find supplements that patient will drink at home. Recommend for patient to continue supplements for at least 10-12 weeks or until post op healing is complete. Plan is for SNF at  discharge.    Per chart, pt appears to be around his UBW.   Medications reviewed and include: allopurinol, aspirin, lovenox, insulin, imodium, MVI, protonix, thiamine, TPN  Labs reviewed: Na 133(L), K 4.8 wnl, BUN 40(H) P 4.8(H), Mg 2.5(H)- 4/3 Wbc- 12.6(H), Hgb 12.1(L), Hct 36.2(L) Cbgs- 143, 132, 100, 106 x 24 hrs   Diet Order:   Diet Order             DIET SOFT Fluid consistency: Thin  Diet effective now                  EDUCATION NEEDS:   Education needs have been addressed  Skin:  Skin Assessment: Reviewed RN Assessment (incision abdomen)  Last BM:  4/4- via ostomy  Height:   Ht Readings from Last 1 Encounters:  03/15/24 5\' 9"  (1.753 m)    Weight:   Wt Readings from Last 1 Encounters:  03/22/24 92.2 kg    Ideal Body Weight:  72.7 kg  BMI:  Body mass index is 30.01 kg/m.  Estimated Nutritional Needs:   Kcal:  1900-2200kcal/day  Protein:  95-110g/day  Fluid:  1.9-2.2L/day  [name], RD, LDN If unable to be reached, please send secure chat to "RD inpatient" available from 8:00a-4:00p daily

## 2024-03-22 NOTE — Plan of Care (Signed)

## 2024-03-22 NOTE — Progress Notes (Signed)
 PROGRESS NOTE    Todd Rojas  GNF:[id number] DOB: 05-21-1941 DOA: 03/09/2024 PCP: Barbette Reichmann, MD    Brief Narrative:   Todd Rojas is an 83 year old male with history of CKD stage IIIa, chronic gout, hypertension, hyperlipidemia, interstitial pulmonary disease, bronchiectasis, COPD, who presents emergency department for chief concerns of fever, chills, shortness of breath for 2 weeks.   Vitals in the ED showed temperature of 98.4, respiration rate 25, heart rate 94, blood pressure initially 96/69 and improved to 107/66, SpO2 initially desatted to 88% on room air  Status post exploratory laparotomy with robotic assisted laparoscopic loop ileostomy and decompression of cecum on 4/2.  Tolerated procedure well.  No ischemic gut noted  Assessment & Plan:   Principal Problem:   Severe sepsis with acute organ dysfunction (HCC) Active Problems:   Fever   Acute hypoxic respiratory failure (HCC)   CAD (coronary artery disease)   Elevated troponin   Gaseous abdominal distention   COPD (chronic obstructive pulmonary disease) (HCC)   Elevated serum creatinine   Hypertension   Hyperlipidemia, unspecified   Primary osteoarthritis of both knees   Thyroid nodule greater than or equal to 1.5 cm in diameter incidentally noted on imaging study   Dyslipidemia   Personal history of gout   Ogilvie syndrome  Gaseous abdominal distention 2/2 Colonic ileus  Ogilvie's syndrome Patient developed abdominal distention and crampy abdominal pain after presentation. Starting 3/25, Imaging with right colonic gaseous distention without any obvious obstruction.  Colonic distention has been worsening. --GenSurg and GI consulted, dx with Ogilvie's syndrome --neostigmine 2 mg push morning of 3/29 with [name] and [name] present with passing of gas and BM afterwards, however, colonic distention unchanged, so pt underwent endoscopic decompression on 3/30.  Unfortunately colonic gas  re-accumulated. --started TPN on 3/31 Plan: Status post loop ileostomy creation 4/2.  Tolerated procedure well.  No ischemic gut noted in operating room.  Repeat KUBs are reassuring.  Advance to soft diet.  Continue TPN for now.  Appreciate surgical follow-up.    Bradycardia, recurrent Initially noted secondary to neostigmine as administration.  Heart rate was down to 20s.  Patient received atropine.  Heart rate improved.  On 4/3 noted to be bradycardic, asymptomatic with heart rate in the 40s to 50s.  Beta-blockade has been held.  Continue holding.  Continue telemetry for next 24 hours.  Can likely discontinue 4/5   * Severe sepsis with acute organ dysfunction (HCC) 2/2 PNA Elevated lactic acid of 2.6, increased respiration rate, with fever of 102 F at home, organ involvement are cardiac and pulmonary with hypoxic respiratory failure at 88% on room air, elevated high sensitive troponin of 98 and elevated BNP --Imaging concerning for right lower lobe pneumonia, although not much upper respiratory symptoms.  Procalcitonin at 0.352. Respiratory panels were negative. UA does not consistent with UTI Patient received broad-spectrum antibiotics Completed antibiotic course Plan: No indication to restart antibiotics.  Continue to monitor vitals and fever curve.  Repeat chest CT in 3 to 4 weeks  Acute hypoxic respiratory failure (HCC) Currently on 2L --Continue supplemental O2 to keep sats >=92%, wean as tolerated   Elevated troponin 2/2 demand ischemia First high sensitivity troponin was elevated at 98 >>101>175>226,  Suspect secondary to demand ischemia in setting of severe sepsis with acute hypoxic respiratory failure Echocardiogram with EF of 40% and regional wall motion abnormalities, discussed with cardiology and they think these abnormalities are chronic. Patient was also experiencing intermittent chest pain over the past  couple of months which seems more exertional --cardio consulted, no  plan for cardiac workup   CAD (coronary artery disease) With history of anterior STEMI in 2006, transferred to [facility] status post cardiac catheterization with PCI to mid LAD using Cypher stent; currently no longer on Plavix Complete echo on 05/17/2022: Estimated ejection fraction is 55%, trivial mitral regurgitation, trivial tricuspid regurgitation, trivial pulmonary valve regurgitation; aortic valve has normal variant --cont home ASA and statin --cont Toprol   COPD (chronic obstructive pulmonary disease) (HCC) --cont home bronchodilator   Elevated serum creatinine Does not meet acute kidney injury, suspect prerenal in setting of nausea vomiting Serum creatinine baseline is 1.35-1.36/eGFR 52-55, consistent with CKD stage III A -Monitor renal function -Avoid nephrotoxins   Hypertension Blood pressure improving. Holding home furosemide and lisinopril Holding metoprolol secondary to bradycardia Will start amlodipine 5 mg daily, uptitrate as necessary    Hyperlipidemia, unspecified --cont home statin   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 Imaging was obtained which shows very mild right knee effusion and no effusion on left. --Voltaren gel and heat pad   Thyroid nodule greater than or equal to 1.5 cm in diameter incidentally noted on imaging study Incidental finding of a 2.7 cm thyroid nodule. -Thyroid ultrasound with solitary nodule which required outpatient biopsy --TSH wnl   Personal history of gout --cont home allopurinol   Rash Developed morning of 3/28, all over chest, back and bilateral thighs.  Appeared to be allergic rxn.  Rash improved with prednisone.  Completed 5 days of prednisone 40 mg daily.   DVT prophylaxis: Lovenox Code Status: Full Family Communication: Spouse at bedside 4/2 Disposition Plan: Status is: Inpatient Remains inpatient appropriate because: Multiple acute issues as above   Level of care:  Med-Surg  Consultants:  General surgery  Procedures:  Exploratory laparotomy with loop ileostomy  Antimicrobials: None   Subjective: Seen and examined.  More awake this morning.  Voice seems stronger.  Objective: Vitals:   03/21/24 2049 03/22/24 0258 03/22/24 0500 03/22/24 0756  BP: 130/61 138/71  126/60  Pulse: (!) 54 60  63  Resp: 16 16    Temp: 97.8 F (36.6 C) 98.1 F (36.7 C)  98.2 F (36.8 C)  TempSrc: Oral Oral  Oral  SpO2: 94% 93%  94%  Weight:   92.2 kg   Height:        Intake/Output Summary (Last 24 hours) at 03/22/2024 1057 Last data filed at 03/22/2024 1050 Gross per 24 hour  Intake 120 ml  Output 1500 ml  Net -1380 ml   Filed Weights   03/20/24 0318 03/21/24 0452 03/22/24 0500  Weight: 93.8 kg 97.1 kg 92.2 kg    Examination:  General exam: No acute distress Respiratory system: Lungs clear.  Normal work of breathing.  2 L Cardiovascular system: S1-S2, RRR, no murmurs, no pedal edema Gastrointestinal system: Soft, laparotomy incision CDI, ileostomy Central nervous system: Alert and oriented. No focal neurological deficits. Extremities: Decreased power bilateral lower extremities.  Gait not assessed Skin: No rashes, lesions or ulcers Psychiatry: Judgement and insight appear normal. Mood & affect appropriate.     Data Reviewed: I have personally reviewed following labs and imaging studies  CBC: Recent Labs  Lab 03/16/24 0547 03/17/24 0318 03/18/24 0457 03/20/24 0432 03/22/24 0605  WBC 9.0 10.5 8.1 9.6 12.6*  HGB 11.5* 11.1* 10.9* 11.1* 12.1*  HCT 34.8* 33.3* 32.2* 33.2* 36.2*  MCV 97.2 98.5 95.8 97.4 96.5  PLT 293 287 288 234 236  Basic Metabolic Panel: Recent Labs  Lab 03/16/24 0547 03/17/24 0318 03/18/24 0457 03/19/24 0535 03/19/24 0536 03/20/24 0432 03/21/24 0541 03/21/24 1509 03/22/24 0605  NA 132* 136 136  --  138 132* 135  --  133*  K 4.6 4.1 4.7  --  4.4 4.7 5.2* 4.8 4.8  CL 98 101 102  --  102 100 102  --  101  CO2  26 25 26   --  29 26 26   --  27  GLUCOSE 147* 116* 154*  --  132* 151* 156*  --  106*  BUN 34* 35* 44*  --  46* 47* 46*  --  40*  CREATININE 1.25* 1.09 1.12  --  1.25* 0.98 1.07  --  1.08  CALCIUM 8.1* 8.0* 8.0*  --  8.2* 7.8* 8.0*  --  7.9*  MG 2.5* 2.5*  --  2.7*  --  2.6* 2.5*  --   --   PHOS  --   --   --   --  4.5 4.2 4.8*  --   --    GFR: Estimated Creatinine Clearance: 58.1 mL/min (by C-G formula based on SCr of 1.08 mg/dL). Liver Function Tests: Recent Labs  Lab 03/16/24 0547 03/19/24 0536 03/20/24 0432 03/21/24 0541  AST 21  --  19 13*  ALT 20  --  21 16  ALKPHOS 38  --  28* 28*  BILITOT 0.6  --  0.4 0.4  PROT 6.5  --  5.7* 5.6*  ALBUMIN 2.1* 2.2* 2.3* 2.2*   No results for input(s): "LIPASE", "AMYLASE" in the last 168 hours. No results for input(s): "AMMONIA" in the last 168 hours. Coagulation Profile: No results for input(s): "INR", "PROTIME" in the last 168 hours. Cardiac Enzymes: No results for input(s): "CKTOTAL", "CKMB", "CKMBINDEX", "TROPONINI" in the last 168 hours. BNP (last 3 results) No results for input(s): "PROBNP" in the last 8760 hours. HbA1C: No results for input(s): "HGBA1C" in the last 72 hours. CBG: Recent Labs  Lab 03/21/24 0519 03/21/24 1143 03/21/24 1733 03/22/24 0039 03/22/24 0555  GLUCAP 141* 145* 146* 106* 100*   Lipid Profile: No results for input(s): "CHOL", "HDL", "LDLCALC", "TRIG", "CHOLHDL", "LDLDIRECT" in the last 72 hours. Thyroid Function Tests: No results for input(s): "TSH", "T4TOTAL", "FREET4", "T3FREE", "THYROIDAB" in the last 72 hours. Anemia Panel: No results for input(s): "VITAMINB12", "FOLATE", "FERRITIN", "TIBC", "IRON", "RETICCTPCT" in the last 72 hours. Sepsis Labs: No results for input(s): "PROCALCITON", "LATICACIDVEN" in the last 168 hours.  Recent Results (from the past 240 hours)  MRSA Next Gen by PCR, Nasal     Status: None   Collection Time: 03/15/24  7:11 PM   Specimen: Nasal Mucosa; Nasal Swab  Result  Value Ref Range Status   MRSA by PCR Next Gen NOT DETECTED NOT DETECTED Final    Comment: (NOTE) The GeneXpert MRSA Assay (FDA approved for NASAL specimens only), is one component of a comprehensive MRSA colonization surveillance program. It is not intended to diagnose MRSA infection nor to guide or monitor treatment for MRSA infections. Test performance is not FDA approved in patients less than 25 years old. Performed at [facility], [address] [place], [place] [address]          Radiology Studies: DG ABD ACUTE 2+V W 1V CHEST Result Date: 03/21/2024 CLINICAL DATA:  Ogilvie syndrome. EXAM: DG ABDOMEN ACUTE WITH 1 VIEW CHEST COMPARISON:  Abdominal radiograph dated 03/20/2024. FINDINGS: Evaluation is limited due to body habitus. Right-sided PICC  in similar position. Mild eventration of the right hemidiaphragm. Small right pleural effusion and right lung base atelectasis. No pneumothorax. Stable cardiomediastinal silhouette. Mild air distention of the stomach. No bowel dilatation or evidence of obstruction. Contrast noted throughout the colon. No free air. The osseous structures are intact. The soft tissues are unremarkable. IMPRESSION: 1. Small right pleural effusion and right lung base atelectasis. 2. Nonobstructive bowel gas pattern. Electronically Signed   By: Elgie Collard M.D.   On: 03/21/2024 12:10        Scheduled Meds:  acetaminophen  1,000 mg Oral Q6H   allopurinol  300 mg Oral Daily   amLODipine  5 mg Oral Daily   aspirin EC  81 mg Oral Daily   Chlorhexidine Gluconate Cloth  6 each Topical Daily   diclofenac Sodium  2 g Topical TID   enoxaparin (LOVENOX) injection  45 mg Subcutaneous Q24H   feeding supplement  237 mL Oral TID BM   insulin aspart  0-9 Units Subcutaneous Q6H   ketorolac  15 mg Intravenous Q6H   mometasone-formoterol  2 puff Inhalation BID   pantoprazole  40 mg Oral Daily   pregabalin  50 mg Oral TID   sodium chloride flush  10-40 mL  Intracatheter Q12H   thiamine (VITAMIN B1) injection  100 mg Intravenous Daily   Or   thiamine  100 mg Oral Daily   Continuous Infusions:  cefoTEtan (CEFOTAN) IV     TPN (CLINIMIX-E) Adult     And   fat emul(SMOFlipid)     TPN (CLINIMIX-E) Adult 42 mL/hr at 03/21/24 1836     LOS: 13 days    Tresa Moore, MD [facility]   If 7PM-7AM, please contact night-coverage  03/22/2024, 10:57 AM

## 2024-03-22 NOTE — TOC Progression Note (Addendum)
 Transition of Care [facility]) - Progression Note    Patient Details  Name: Todd Rojas MRN: [id number] Date of Birth: [birth year]  [facility] CM/SW Contact  Liliana Cline, LCSW Phone Number: 03/22/2024, 1:12 PM  Clinical Narrative:    TOC continues to follow. Plan for [facility] when medically ready per chart. Asked MD to update TOC on when to start auth.  1:30- Auth started for [facility] on Monday.   2:03[facility] approved [id number]. Valid 4/7-4/13   4:40- Call to patient's spouse per her request. Provided update - plan for [facility] on Monday - spouse can transport.   Expected Discharge Plan: Skilled Nursing Facility Barriers to Discharge: Continued Medical Work up  Expected Discharge Plan and Services     Post Acute Care Choice:  (TBD) Living arrangements for the past 2 months: Single Family Home                                       Social Determinants of Health (SDOH) Interventions SDOH Screenings   Food Insecurity: No Food Insecurity (03/10/2024)  Housing: Low Risk  (03/10/2024)  Transportation Needs: No Transportation Needs (03/10/2024)  Utilities: Not At Risk (03/10/2024)  Depression (PHQ2-9): Low Risk  (07/12/2021)  Financial Resource Strain: Low Risk  (10/23/2023)   Received from [facility]  Social Connections: Socially Integrated (03/10/2024)  Tobacco Use: Medium Risk (03/20/2024)    Readmission Risk Interventions     No data to display

## 2024-03-22 NOTE — Progress Notes (Signed)
 PHARMACY - TOTAL PARENTERAL NUTRITION CONSULT NOTE   Indication: Ogilvie's syndrome  Patient Measurements: Height: 5\' 9"  (175.3 cm) Weight: 92.2 kg (203 lb 3.2 oz) IBW/kg (Calculated) : 70.7 TPN AdjBW (KG): 77.3 Body mass index is 30.01 kg/m.  Assessment: 83 y.o. 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   Glucose / Insulin:  --BG controlled --2u SSI last 24h (dexamethasone 10 mg IV x1  on 4/2) Electrolytes: Mild hyponatremia Renal: SCr ~ 1. UOP not being documented strictly Hepatic: LFTs within normal limits Intake / Output; I&O not being strictly documented MIVF: N/A GI Imaging:Serial KUB prn --No new pertinent imaging studies  GI Surgeries / Procedures:  4/2-s/p surgery-POD2-robotic assisted laparoscopic loop ileostomy with decompression of cecum for Ogilvie Syndrome,   Central access: 03/18/24 TPN start date: 03/18/24 Estimated Needs Total Energy Estimated Needs: 1900-2200kcal/day Total Protein Estimated Needs: 95-110g/day Total Fluid Estimated Needs: 1.9-2.2L/day  Nutritional Goals: Recommend 1000 mL premix Clinimix E 8/10 bag 4x/week and 2000 mL Clinimix bag 3x/week with SMOF lipids daily - regimen would provide 1447 average kcal/day and 114 gm of average protein/day  Current Nutrition:  Full liquid diet >> soft diet 4/4  Plan:  ---Continue Clinimix E 8/10 1 L at 42 ml/hr + SMOFlipid 20% 250 mL over 12h at 1800 ---Electrolytes in TPN: standard per Clinimix E 8/10 formulation (contains 30 mEq K+) ---Add standard MVI and trace elements to TPN, thiamine ordered outside of TPNx5d --- Sensitive q6h SSI and adjust as needed  ---Monitor TPN labs daily until stable then on Mon/Thurs, re-check BMP tomorrow AM  Bari Mantis PharmD Clinical Pharmacist 03/22/2024

## 2024-03-22 NOTE — Progress Notes (Signed)
 Occupational Therapy Treatment Patient Details Name: Todd Rojas MRN: [id number] DOB: October 25, 1941 Today's Date: 03/22/2024   History of present illness Pt is an 83 year old male   MD work up including sepsis, acute hypoxic respiratory failure, PNA    Pmh significant for CKD stage IIIa, chronic gout, hypertension, hyperlipidemia, interstitial pulmonary disease, bronchiectasis, COPD. Pt is s/p exploratory lapileostomy and decompression of cecum on 4/2.   OT comments  Upon entering the room, pt supine in bed and agreeable to OT intervention but does endorse fatigue and recently getting into bed. Pt declined self care tasks this session. We did discuss considering to use urinal or ambulate to commode with staff for increased functional mobility opportunities. Pt performing supine >sit with min A and stands with min guard. Pt ambulating 100' with RW and close supervision before returning back to bed secondary to fatigue. Bed placed into trendelenburg and put able to bridge to push self up further in bed. Pt's wife remains in room. Call bell and all needed items within reach upon exiting the room.       If plan is discharge home, recommend the following:  A little help with walking and/or transfers;A little help with bathing/dressing/bathroom;Assistance with cooking/housework;Assist for transportation;Help with stairs or ramp for entrance   Equipment Recommendations  BSC/3in1;Tub/shower seat       Precautions / Restrictions Precautions Precautions: Fall Recall of Precautions/Restrictions: Intact       Mobility Bed Mobility Overal bed mobility: Needs Assistance Bed Mobility: Supine to Sit, Sit to Supine     Supine to sit: Min assist Sit to supine: Min assist        Transfers Overall transfer level: Needs assistance Equipment used: Rolling walker (2 wheels) Transfers: Sit to/from Stand Sit to Stand: Contact guard assist                 Balance Overall balance  assessment: Needs assistance Sitting-balance support: Feet supported, Bilateral upper extremity supported Sitting balance-Leahy Scale: Good     Standing balance support: Bilateral upper extremity supported, Reliant on assistive device for balance Standing balance-Leahy Scale: Fair                             ADL either performed or assessed with clinical judgement    Extremity/Trunk Assessment Upper Extremity Assessment Upper Extremity Assessment: Generalized weakness   Lower Extremity Assessment Lower Extremity Assessment: Generalized weakness        Vision Patient Visual Report: No change from baseline           Communication Communication Communication: Impaired Factors Affecting Communication: Hearing impaired   Cognition Arousal: Alert Behavior During Therapy: WFL for tasks assessed/performed Cognition: No apparent impairments                               Following commands: Intact Following commands impaired: Follows one step commands with increased time      Cueing   Cueing Techniques: Verbal cues, Tactile cues  Exercises              Pertinent Vitals/ Pain       Pain Assessment Pain Assessment: No/denies pain         Frequency  Min 2X/week        Progress Toward Goals  OT Goals(current goals can now be found in the care plan section)  Progress towards OT goals: Progressing  toward goals      AM-PAC OT "6 Clicks" Daily Activity     Outcome Measure   Help from another person eating meals?: None Help from another person taking care of personal grooming?: A Little Help from another person toileting, which includes using toliet, bedpan, or urinal?: A Lot Help from another person bathing (including washing, rinsing, drying)?: A Lot Help from another person to put on and taking off regular upper body clothing?: A Little Help from another person to put on and taking off regular lower body clothing?: A Lot 6 Click  Score: 16    End of Session Equipment Utilized During Treatment: Rolling walker (2 wheels)  OT Visit Diagnosis: Other abnormalities of gait and mobility (R26.89)   Activity Tolerance Patient tolerated treatment well   Patient Left with call bell/phone within reach;in bed;with bed alarm set;with family/visitor present   Nurse Communication Mobility status        Time: 1413-1430 OT Time Calculation (min): 17 min  Charges: OT General Charges $OT Visit: 1 Visit OT Treatments $Therapeutic Activity: 8-22 mins  Jackquline Denmark, MS, OTR/L , CBIS ascom [phone]  03/22/24, 3:12 PM

## 2024-03-23 DIAGNOSIS — A419 Sepsis, unspecified organism: Secondary | ICD-10-CM | POA: Diagnosis not present

## 2024-03-23 DIAGNOSIS — R6520 Severe sepsis without septic shock: Secondary | ICD-10-CM | POA: Diagnosis not present

## 2024-03-23 LAB — BASIC METABOLIC PANEL WITH GFR
Anion gap: 6 (ref 5–15)
BUN: 35 mg/dL — ABNORMAL HIGH (ref 8–23)
CO2: 25 mmol/L (ref 22–32)
Calcium: 8.3 mg/dL — ABNORMAL LOW (ref 8.9–10.3)
Chloride: 103 mmol/L (ref 98–111)
Creatinine, Ser: 0.96 mg/dL (ref 0.61–1.24)
GFR, Estimated: >60 mL/min (ref >60)
Glucose, Bld: 125 mg/dL — ABNORMAL HIGH (ref 70–99)
Potassium: 4.7 mmol/L (ref 3.5–5.1)
Sodium: 134 mmol/L — ABNORMAL LOW (ref 135–145)

## 2024-03-23 LAB — GLUCOSE, CAPILLARY
Glucose-Capillary: 124 mg/dL — ABNORMAL HIGH (ref 70–99)
Glucose-Capillary: 136 mg/dL — ABNORMAL HIGH (ref 70–99)
Glucose-Capillary: 136 mg/dL — ABNORMAL HIGH (ref 70–99)
Glucose-Capillary: 147 mg/dL — ABNORMAL HIGH (ref 70–99)
Glucose-Capillary: 152 mg/dL — ABNORMAL HIGH (ref 70–99)

## 2024-03-23 MED ORDER — SIMETHICONE 80 MG PO CHEW
80.0000 mg | CHEWABLE_TABLET | Freq: Four times a day (QID) | ORAL | Status: DC
  Administered 2024-03-23 – 2024-03-28 (×15): 80 mg via ORAL
  Filled 2024-03-23 (×17): qty 1

## 2024-03-23 NOTE — Progress Notes (Signed)
 PHARMACY - TOTAL PARENTERAL NUTRITION CONSULT NOTE   Indication: Ogilvie's syndrome  Patient Measurements: Height: 5\' 9"  (175.3 cm) Weight: 88 kg (194 lb 0.1 oz) IBW/kg (Calculated) : 70.7 TPN AdjBW (KG): 77.3 Body mass index is 28.65 kg/m.  Assessment: 83 y.o. 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   Glucose / Insulin:  --BG controlled --3u SSI last 24h (dexamethasone 10 mg IV x1  on 4/2) Electrolytes: Mild hyponatremia Renal: SCr ~ 1. UOP not being documented strictly Hepatic: LFTs within normal limits Intake / Output; I&O not being strictly documented MIVF: N/A GI Imaging:Serial KUB prn --No new pertinent imaging studies  GI Surgeries / Procedures:  4/2-s/p surgery-POD2-robotic assisted laparoscopic loop ileostomy with decompression of cecum for Ogilvie Syndrome,   Central access: 03/18/24 TPN start date: 03/18/24  Estimated Needs Total Energy Estimated Needs: 1900-2200kcal/day Total Protein Estimated Needs: 95-110g/day Total Fluid Estimated Needs: 1.9-2.2L/day  Nutritional Goals: Recommend 1000 mL premix Clinimix E 8/10 bag 4x/week and 2000 mL Clinimix bag 3x/week with SMOF lipids daily - regimen would provide 1447 average kcal/day and 114 gm of average protein/day  Current Nutrition:  Full liquid diet >> soft diet 4/4  Plan:  Plan to let TPN run out today.   Paschal Dopp, PharmD Clinical Pharmacist 03/23/2024

## 2024-03-23 NOTE — Progress Notes (Signed)
 PROGRESS NOTE    Todd Rojas  JXB:[id number] DOB: [birth year] DOA: 03/09/2024 PCP: Barbette Reichmann, MD    Brief Narrative:   Todd Rojas is an 83 year old male with history of CKD stage IIIa, chronic gout, hypertension, hyperlipidemia, interstitial pulmonary disease, bronchiectasis, COPD, who presents emergency department for chief concerns of fever, chills, shortness of breath for 2 weeks.   Vitals in the ED showed temperature of 98.4, respiration rate 25, heart rate 94, blood pressure initially 96/69 and improved to 107/66, SpO2 initially desatted to 88% on room air  Status post exploratory laparotomy with robotic assisted laparoscopic loop ileostomy and decompression of cecum on 4/2.  Tolerated procedure well.  No ischemic gut noted  Assessment & Plan:   Principal Problem:   Severe sepsis with acute organ dysfunction (HCC) Active Problems:   Fever   Acute hypoxic respiratory failure (HCC)   CAD (coronary artery disease)   Elevated troponin   Gaseous abdominal distention   COPD (chronic obstructive pulmonary disease) (HCC)   Elevated serum creatinine   Hypertension   Hyperlipidemia, unspecified   Primary osteoarthritis of both knees   Thyroid nodule greater than or equal to 1.5 cm in diameter incidentally noted on imaging study   Dyslipidemia   Personal history of gout   Ogilvie syndrome  Gaseous abdominal distention 2/2 Colonic ileus  Ogilvie's syndrome Patient developed abdominal distention and crampy abdominal pain after presentation. Starting 3/25, Imaging with right colonic gaseous distention without any obvious obstruction.  Colonic distention has been worsening. --GenSurg and GI consulted, dx with Ogilvie's syndrome --neostigmine 2 mg push morning of 3/29 with [name] and [name] present with passing of gas and BM afterwards, however, colonic distention unchanged, so pt underwent endoscopic decompression on 3/30.  Unfortunately colonic gas  re-accumulated. --started TPN on 3/31 Plan: Status post loop ileostomy creation 4/2.  Tolerated procedure well.  No ischemic gut noted in operating room.  Repeat KUBs are reassuring.  Continue soft diet.  Reflux and gas regimen.  Wean TPN as tolerated.  Bradycardia, recurrent Initially noted secondary to neostigmine as administration.  Heart rate was down to 20s.  Patient received atropine.  Heart rate improved.  On 4/3 noted to be bradycardic, asymptomatic with heart rate in the 40s to 50s.  Beta-blockade has been held.  Continue holding for now.  Can likely restart 4/6   * Severe sepsis with acute organ dysfunction (HCC) 2/2 PNA Elevated lactic acid of 2.6, increased respiration rate, with fever of 102 F at home, organ involvement are cardiac and pulmonary with hypoxic respiratory failure at 88% on room air, elevated high sensitive troponin of 98 and elevated BNP --Imaging concerning for right lower lobe pneumonia, although not much upper respiratory symptoms.  Procalcitonin at 0.352. Respiratory panels were negative. UA does not consistent with UTI Patient received broad-spectrum antibiotics Completed antibiotic course Plan: No indication to restart antibiotics.  Continue to monitor vitals and fever curve.  Repeat chest CT in 3 to 4 weeks  Acute hypoxic respiratory failure (HCC) Currently on 2L --Continue supplemental O2 to keep sats >=92%, wean as tolerated   Elevated troponin 2/2 demand ischemia First high sensitivity troponin was elevated at 98 >>101>175>226,  Suspect secondary to demand ischemia in setting of severe sepsis with acute hypoxic respiratory failure Echocardiogram with EF of 40% and regional wall motion abnormalities, discussed with cardiology and they think these abnormalities are chronic. Patient was also experiencing intermittent chest pain over the past couple of months which seems more exertional --  cardio consulted, no plan for cardiac workup   CAD (coronary  artery disease) With history of anterior STEMI in 2006, transferred to [facility] status post cardiac catheterization with PCI to mid LAD using Cypher stent; currently no longer on Plavix Complete echo on 05/17/2022: Estimated ejection fraction is 55%, trivial mitral regurgitation, trivial tricuspid regurgitation, trivial pulmonary valve regurgitation; aortic valve has normal variant --cont home ASA and statin --cont Toprol   COPD (chronic obstructive pulmonary disease) (HCC) --cont home bronchodilator   Elevated serum creatinine Does not meet acute kidney injury, suspect prerenal in setting of nausea vomiting Serum creatinine baseline is 1.35-1.36/eGFR 52-55, consistent with CKD stage III A -Monitor renal function -Avoid nephrotoxins   Hypertension Blood pressure improving. Holding home furosemide and lisinopril Holding metoprolol secondary to bradycardia Will start amlodipine 5 mg daily, uptitrate as necessary    Hyperlipidemia, unspecified --cont home statin   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 Imaging was obtained which shows very mild right knee effusion and no effusion on left. --Voltaren gel and heat pad   Thyroid nodule greater than or equal to 1.5 cm in diameter incidentally noted on imaging study Incidental finding of a 2.7 cm thyroid nodule. -Thyroid ultrasound with solitary nodule which required outpatient biopsy --TSH wnl   Personal history of gout --cont home allopurinol   Rash Developed morning of 3/28, all over chest, back and bilateral thighs.  Appeared to be allergic rxn.  Rash improved with prednisone.  Completed 5 days of prednisone 40 mg daily.   DVT prophylaxis: Lovenox Code Status: Full Family Communication: Spouse at bedside 4/2 Disposition Plan: Status is: Inpatient Remains inpatient appropriate because: Multiple acute issues as above   Level of care: Med-Surg  Consultants:  General surgery  Procedures:   Exploratory laparotomy with loop ileostomy  Antimicrobials: None   Subjective: Seen and examined.  More sleepy this morning.  Reports his "heart hurts" when attempting to eat.  Objective: Vitals:   03/22/24 2028 03/23/24 0431 03/23/24 0444 03/23/24 0822  BP: 132/66 111/69  (!) 114/57  Pulse: 84 79  74  Resp: 20 20  16   Temp: 97.8 F (36.6 C) (!) 97.5 F (36.4 C)  98 F (36.7 C)  TempSrc:      SpO2: 95% 95%  93%  Weight:   88 kg   Height:        Intake/Output Summary (Last 24 hours) at 03/23/2024 1153 Last data filed at 03/23/2024 0800 Gross per 24 hour  Intake 490.23 ml  Output 1900 ml  Net -1409.77 ml   Filed Weights   03/21/24 0452 03/22/24 0500 03/23/24 0444  Weight: 97.1 kg 92.2 kg 88 kg    Examination:  General exam: No acute distress.  Appears fatigued Respiratory system: Lungs clear.  Normal work of breathing.  Room air Cardiovascular system: S1-S2, RRR, no murmurs, no pedal edema Gastrointestinal system: Soft, laparotomy incision CDI, ileostomy Central nervous system: Alert and oriented. No focal neurological deficits. Extremities: Decreased power bilateral lower extremities.  Gait not assessed Skin: No rashes, lesions or ulcers Psychiatry: Judgement and insight appear normal. Mood & affect appropriate.     Data Reviewed: I have personally reviewed following labs and imaging studies  CBC: Recent Labs  Lab 03/17/24 0318 03/18/24 0457 03/20/24 0432 03/22/24 0605  WBC 10.5 8.1 9.6 12.6*  HGB 11.1* 10.9* 11.1* 12.1*  HCT 33.3* 32.2* 33.2* 36.2*  MCV 98.5 95.8 97.4 96.5  PLT 287 288 234 236   Basic Metabolic Panel:  Recent Labs  Lab 03/17/24 0318 03/18/24 0457 03/19/24 0535 03/19/24 0536 03/20/24 0432 03/21/24 0541 03/21/24 1509 03/22/24 0605 03/23/24 0405  NA 136   < >  --  138 132* 135  --  133* 134*  K 4.1   < >  --  4.4 4.7 5.2* 4.8 4.8 4.7  CL 101   < >  --  102 100 102  --  101 103  CO2 25   < >  --  29 26 26   --  27 25  GLUCOSE  116*   < >  --  132* 151* 156*  --  106* 125*  BUN 35*   < >  --  46* 47* 46*  --  40* 35*  CREATININE 1.09   < >  --  1.25* 0.98 1.07  --  1.08 0.96  CALCIUM 8.0*   < >  --  8.2* 7.8* 8.0*  --  7.9* 8.3*  MG 2.5*  --  2.7*  --  2.6* 2.5*  --   --   --   PHOS  --   --   --  4.5 4.2 4.8*  --   --   --    < > = values in this interval not displayed.   GFR: Estimated Creatinine Clearance: 64 mL/min (by C-G formula based on SCr of 0.96 mg/dL). Liver Function Tests: Recent Labs  Lab 03/19/24 0536 03/20/24 0432 03/21/24 0541  AST  --  19 13*  ALT  --  21 16  ALKPHOS  --  28* 28*  BILITOT  --  0.4 0.4  PROT  --  5.7* 5.6*  ALBUMIN 2.2* 2.3* 2.2*   No results for input(s): "LIPASE", "AMYLASE" in the last 168 hours. No results for input(s): "AMMONIA" in the last 168 hours. Coagulation Profile: No results for input(s): "INR", "PROTIME" in the last 168 hours. Cardiac Enzymes: No results for input(s): "CKTOTAL", "CKMB", "CKMBINDEX", "TROPONINI" in the last 168 hours. BNP (last 3 results) No results for input(s): "PROBNP" in the last 8760 hours. HbA1C: No results for input(s): "HGBA1C" in the last 72 hours. CBG: Recent Labs  Lab 03/22/24 1159 03/22/24 1635 03/23/24 0024 03/23/24 0605 03/23/24 0835  GLUCAP 143* 139* 124* 147* 136*   Lipid Profile: No results for input(s): "CHOL", "HDL", "LDLCALC", "TRIG", "CHOLHDL", "LDLDIRECT" in the last 72 hours. Thyroid Function Tests: No results for input(s): "TSH", "T4TOTAL", "FREET4", "T3FREE", "THYROIDAB" in the last 72 hours. Anemia Panel: No results for input(s): "VITAMINB12", "FOLATE", "FERRITIN", "TIBC", "IRON", "RETICCTPCT" in the last 72 hours. Sepsis Labs: No results for input(s): "PROCALCITON", "LATICACIDVEN" in the last 168 hours.  Recent Results (from the past 240 hours)  MRSA Next Gen by PCR, Nasal     Status: None   Collection Time: 03/15/24  7:11 PM   Specimen: Nasal Mucosa; Nasal Swab  Result Value Ref Range Status    MRSA by PCR Next Gen NOT DETECTED NOT DETECTED Final    Comment: (NOTE) The GeneXpert MRSA Assay (FDA approved for NASAL specimens only), is one component of a comprehensive MRSA colonization surveillance program. It is not intended to diagnose MRSA infection nor to guide or monitor treatment for MRSA infections. Test performance is not FDA approved in patients less than 17 years old. Performed at [facility], [address], [place], [place] [address]          Radiology Studies: No results found.       Scheduled Meds:  acetaminophen  1,000 mg Oral Q6H   allopurinol  300 mg Oral Daily   amLODipine  5 mg Oral Daily   aspirin EC  81 mg Oral Daily   Chlorhexidine Gluconate Cloth  6 each Topical Daily   diclofenac Sodium  2 g Topical TID   enoxaparin (LOVENOX) injection  45 mg Subcutaneous Q24H   famotidine  20 mg Oral BID   feeding supplement (NEPRO CARB STEADY)  237 mL Oral TID BM   insulin aspart  0-9 Units Subcutaneous Q6H   loperamide  2 mg Oral Q8H   mometasone-formoterol  2 puff Inhalation BID   multivitamin with minerals  1 tablet Oral Daily   pantoprazole  40 mg Oral Daily   pregabalin  50 mg Oral TID   simethicone  80 mg Oral QID   sodium chloride flush  10-40 mL Intracatheter Q12H   thiamine (VITAMIN B1) injection  100 mg Intravenous Daily   Or   thiamine  100 mg Oral Daily   Continuous Infusions:  cefoTEtan (CEFOTAN) IV     TPN (CLINIMIX-E) Adult 42 mL/hr at 03/23/24 0800     LOS: 14 days    Tresa Moore, MD [facility]   If 7PM-7AM, please contact night-coverage  03/23/2024, 11:53 AM

## 2024-03-23 NOTE — Plan of Care (Signed)

## 2024-03-24 DIAGNOSIS — R6520 Severe sepsis without septic shock: Secondary | ICD-10-CM | POA: Diagnosis not present

## 2024-03-24 DIAGNOSIS — A419 Sepsis, unspecified organism: Secondary | ICD-10-CM | POA: Diagnosis not present

## 2024-03-24 LAB — GLUCOSE, CAPILLARY
Glucose-Capillary: 121 mg/dL — ABNORMAL HIGH (ref 70–99)
Glucose-Capillary: 122 mg/dL — ABNORMAL HIGH (ref 70–99)
Glucose-Capillary: 138 mg/dL — ABNORMAL HIGH (ref 70–99)
Glucose-Capillary: 139 mg/dL — ABNORMAL HIGH (ref 70–99)
Glucose-Capillary: 156 mg/dL — ABNORMAL HIGH (ref 70–99)

## 2024-03-24 MED ORDER — METOPROLOL TARTRATE 25 MG PO TABS
12.5000 mg | ORAL_TABLET | Freq: Two times a day (BID) | ORAL | Status: DC
  Administered 2024-03-24 (×2): 12.5 mg via ORAL
  Filled 2024-03-24 (×2): qty 1

## 2024-03-24 NOTE — Progress Notes (Signed)
 PROGRESS NOTE    [name]  ZOX:[id number] DOB: [birth year] DOA: 03/09/2024 PCP: Barbette Reichmann, MD    Brief Narrative:   Todd Rojas is an 83 year old male with history of CKD stage IIIa, chronic gout, hypertension, hyperlipidemia, interstitial pulmonary disease, bronchiectasis, COPD, who presents emergency department for chief concerns of fever, chills, shortness of breath for 2 weeks.   Vitals in the ED showed temperature of 98.4, respiration rate 25, heart rate 94, blood pressure initially 96/69 and improved to 107/66, SpO2 initially desatted to 88% on room air  Status post exploratory laparotomy with robotic assisted laparoscopic loop ileostomy and decompression of cecum on 4/2.  Tolerated procedure well.  No ischemic gut noted  Assessment & Plan:   Principal Problem:   Severe sepsis with acute organ dysfunction (HCC) Active Problems:   Fever   Acute hypoxic respiratory failure (HCC)   CAD (coronary artery disease)   Elevated troponin   Gaseous abdominal distention   COPD (chronic obstructive pulmonary disease) (HCC)   Elevated serum creatinine   Hypertension   Hyperlipidemia, unspecified   Primary osteoarthritis of both knees   Thyroid nodule greater than or equal to 1.5 cm in diameter incidentally noted on imaging study   Dyslipidemia   Personal history of gout   Ogilvie syndrome  Gaseous abdominal distention 2/2 Colonic ileus  Ogilvie's syndrome Patient developed abdominal distention and crampy abdominal pain after presentation. Starting 3/25, Imaging with right colonic gaseous distention without any obvious obstruction.  Colonic distention has been worsening. --GenSurg and GI consulted, dx with Ogilvie's syndrome --neostigmine 2 mg push morning of 3/29 with [name] and [name] present with passing of gas and BM afterwards, however, colonic distention unchanged, so pt underwent endoscopic decompression on 3/30.  Unfortunately colonic gas  re-accumulated. --started TPN on 3/31 Plan: Status post loop ileostomy creation 4/2.  Tolerated procedure well.  No ischemic gut noted in operating room.  Patient is having difficulty tolerating p.o. diet.  Endorses abnormal taste and reflux/gas.  Diet as tolerated.  Multimodal reflux and gas regimen.  Repeat KUB 0500 tomorrow.  Bradycardia, recurrent Initially noted secondary to neostigmine as administration.  Heart rate was down to 20s.  Patient received atropine.  Heart rate improved.  On 4/3 noted to be bradycardic, asymptomatic with heart rate in the 40s to 50s.  Beta-blockade has been held.  Plan:  Can restart oral metoprolol 4/6, heart rate has improved We will restart metoprolol tartrate 12.5 twice daily   * Severe sepsis with acute organ dysfunction (HCC) 2/2 PNA Elevated lactic acid of 2.6, increased respiration rate, with fever of 102 F at home, organ involvement are cardiac and pulmonary with hypoxic respiratory failure at 88% on room air, elevated high sensitive troponin of 98 and elevated BNP --Imaging concerning for right lower lobe pneumonia, although not much upper respiratory symptoms.  Procalcitonin at 0.352. Respiratory panels were negative. UA does not consistent with UTI Patient received broad-spectrum antibiotics Completed antibiotic course Plan: No indication to restart antibiotics.  Continue to monitor vitals and fever curve.  Repeat chest CT in 3 to 4 weeks  Acute hypoxic respiratory failure (HCC) Currently on 2L --Continue supplemental O2 to keep sats >=92%, wean as tolerated   Elevated troponin 2/2 demand ischemia First high sensitivity troponin was elevated at 98 >>101>175>226,  Suspect secondary to demand ischemia in setting of severe sepsis with acute hypoxic respiratory failure Echocardiogram with EF of 40% and regional wall motion abnormalities, discussed with cardiology and they think these  abnormalities are chronic. Patient was also experiencing  intermittent chest pain over the past couple of months which seems more exertional --cardio consulted, no plan for cardiac workup   CAD (coronary artery disease) With history of anterior STEMI in 2006, transferred to [facility] status post cardiac catheterization with PCI to mid LAD using Cypher stent; currently no longer on Plavix Complete echo on 05/17/2022: Estimated ejection fraction is 55%, trivial mitral regurgitation, trivial tricuspid regurgitation, trivial pulmonary valve regurgitation; aortic valve has normal variant --cont home ASA and statin --cont Toprol   COPD (chronic obstructive pulmonary disease) (HCC) --cont home bronchodilator   Elevated serum creatinine Does not meet acute kidney injury, suspect prerenal in setting of nausea vomiting Serum creatinine baseline is 1.35-1.36/eGFR 52-55, consistent with CKD stage III A -Monitor renal function -Avoid nephrotoxins   Hypertension Blood pressure improving. Holding home furosemide and lisinopril Holding metoprolol secondary to bradycardia Will start amlodipine 5 mg daily, uptitrate as necessary    Hyperlipidemia, unspecified --cont home statin   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 Imaging was obtained which shows very mild right knee effusion and no effusion on left. --Voltaren gel and heat pad   Thyroid nodule greater than or equal to 1.5 cm in diameter incidentally noted on imaging study Incidental finding of a 2.7 cm thyroid nodule. -Thyroid ultrasound with solitary nodule which required outpatient biopsy --TSH wnl   Personal history of gout --cont home allopurinol   Rash Developed morning of 3/28, all over chest, back and bilateral thighs.  Appeared to be allergic rxn.  Rash improved with prednisone.  Completed 5 days of prednisone 40 mg daily.   DVT prophylaxis: Lovenox Code Status: Full Family Communication: Spouse at bedside 4/2, 4/6 Disposition Plan: Status is:  Inpatient Remains inpatient appropriate because: Multiple acute issues as above   Level of care: Med-Surg  Consultants:  General surgery  Procedures:  Exploratory laparotomy with loop ileostomy  Antimicrobials: None   Subjective: Seen and examined.  Color appears better this morning.  Voice appears stronger.  Continues to endorse reflux and gas pain. Objective: Vitals:   03/24/24 0325 03/24/24 0440 03/24/24 0818 03/24/24 1121  BP: 112/68  119/67   Pulse: 85  81   Resp: 20  18   Temp: (!) 96.3 F (35.7 C)  (!) 97.4 F (36.3 C)   TempSrc:      SpO2: 94%  95%   Weight:  87.6 kg  85.1 kg  Height:        Intake/Output Summary (Last 24 hours) at 03/24/2024 1216 Last data filed at 03/24/2024 0039 Gross per 24 hour  Intake 455 ml  Output 1750 ml  Net -1295 ml   Filed Weights   03/23/24 0444 03/24/24 0440 03/24/24 1121  Weight: 88 kg 87.6 kg 85.1 kg    Examination:  General exam: NAD.  Appears frail and fatigued Respiratory system: Lungs clear.  Normal work of breathing.  Room air Cardiovascular system: S1-S2, RRR, no murmurs, no pedal edema Gastrointestinal system: Soft, laparotomy incision CDI, ileostomy Central nervous system: Alert and oriented. No focal neurological deficits. Extremities: Decreased power bilateral lower extremities.  Gait not assessed Skin: No rashes, lesions or ulcers Psychiatry: Judgement and insight appear normal. Mood & affect appropriate.     Data Reviewed: I have personally reviewed following labs and imaging studies  CBC: Recent Labs  Lab 03/18/24 0457 03/20/24 0432 03/22/24 0605  WBC 8.1 9.6 12.6*  HGB 10.9* 11.1* 12.1*  HCT 32.2* 33.2* 36.2*  MCV 95.8 97.4 96.5  PLT 288 234 236   Basic Metabolic Panel: Recent Labs  Lab 03/19/24 0535 03/19/24 0536 03/20/24 0432 03/21/24 0541 03/21/24 1509 03/22/24 0605 03/23/24 0405  NA  --  138 132* 135  --  133* 134*  K  --  4.4 4.7 5.2* 4.8 4.8 4.7  CL  --  102 100 102  --  101  103  CO2  --  29 26 26   --  27 25  GLUCOSE  --  132* 151* 156*  --  106* 125*  BUN  --  46* 47* 46*  --  40* 35*  CREATININE  --  1.25* 0.98 1.07  --  1.08 0.96  CALCIUM  --  8.2* 7.8* 8.0*  --  7.9* 8.3*  MG 2.7*  --  2.6* 2.5*  --   --   --   PHOS  --  4.5 4.2 4.8*  --   --   --    GFR: Estimated Creatinine Clearance: 63.1 mL/min (by C-G formula based on SCr of 0.96 mg/dL). Liver Function Tests: Recent Labs  Lab 03/19/24 0536 03/20/24 0432 03/21/24 0541  AST  --  19 13*  ALT  --  21 16  ALKPHOS  --  28* 28*  BILITOT  --  0.4 0.4  PROT  --  5.7* 5.6*  ALBUMIN 2.2* 2.3* 2.2*   No results for input(s): "LIPASE", "AMYLASE" in the last 168 hours. No results for input(s): "AMMONIA" in the last 168 hours. Coagulation Profile: No results for input(s): "INR", "PROTIME" in the last 168 hours. Cardiac Enzymes: No results for input(s): "CKTOTAL", "CKMB", "CKMBINDEX", "TROPONINI" in the last 168 hours. BNP (last 3 results) No results for input(s): "PROBNP" in the last 8760 hours. HbA1C: No results for input(s): "HGBA1C" in the last 72 hours. CBG: Recent Labs  Lab 03/23/24 0835 03/23/24 1302 03/23/24 1635 03/24/24 0002 03/24/24 0557  GLUCAP 136* 136* 152* 122* 121*   Lipid Profile: No results for input(s): "CHOL", "HDL", "LDLCALC", "TRIG", "CHOLHDL", "LDLDIRECT" in the last 72 hours. Thyroid Function Tests: No results for input(s): "TSH", "T4TOTAL", "FREET4", "T3FREE", "THYROIDAB" in the last 72 hours. Anemia Panel: No results for input(s): "VITAMINB12", "FOLATE", "FERRITIN", "TIBC", "IRON", "RETICCTPCT" in the last 72 hours. Sepsis Labs: No results for input(s): "PROCALCITON", "LATICACIDVEN" in the last 168 hours.  Recent Results (from the past 240 hours)  MRSA Next Gen by PCR, Nasal     Status: None   Collection Time: 03/15/24  7:11 PM   Specimen: Nasal Mucosa; Nasal Swab  Result Value Ref Range Status   MRSA by PCR Next Gen NOT DETECTED NOT DETECTED Final    Comment:  (NOTE) The GeneXpert MRSA Assay (FDA approved for NASAL specimens only), is one component of a comprehensive MRSA colonization surveillance program. It is not intended to diagnose MRSA infection nor to guide or monitor treatment for MRSA infections. Test performance is not FDA approved in patients less than 29 years old. Performed at [facility], [address] [place], [place] [address]          Radiology Studies: No results found.       Scheduled Meds:  acetaminophen  1,000 mg Oral Q6H   allopurinol  300 mg Oral Daily   amLODipine  5 mg Oral Daily   aspirin EC  81 mg Oral Daily   Chlorhexidine Gluconate Cloth  6 each Topical Daily   diclofenac Sodium  2 g Topical TID  enoxaparin (LOVENOX) injection  45 mg Subcutaneous Q24H   famotidine  20 mg Oral BID   feeding supplement (NEPRO CARB STEADY)  237 mL Oral TID BM   insulin aspart  0-9 Units Subcutaneous Q6H   loperamide  2 mg Oral Q8H   mometasone-formoterol  2 puff Inhalation BID   multivitamin with minerals  1 tablet Oral Daily   pantoprazole  40 mg Oral Daily   pregabalin  50 mg Oral TID   simethicone  80 mg Oral QID   sodium chloride flush  10-40 mL Intracatheter Q12H   Continuous Infusions:     LOS: 15 days    Tresa Moore, MD [facility]   If 7PM-7AM, please contact night-coverage  03/24/2024, 12:16 PM

## 2024-03-25 ENCOUNTER — Encounter: Payer: Self-pay | Admitting: Internal Medicine

## 2024-03-25 ENCOUNTER — Inpatient Hospital Stay

## 2024-03-25 DIAGNOSIS — R6520 Severe sepsis without septic shock: Secondary | ICD-10-CM | POA: Diagnosis not present

## 2024-03-25 DIAGNOSIS — A419 Sepsis, unspecified organism: Secondary | ICD-10-CM | POA: Diagnosis not present

## 2024-03-25 LAB — CBC
HCT: 42.5 % (ref 39.0–52.0)
Hemoglobin: 14.4 g/dL (ref 13.0–17.0)
MCH: 32.7 pg (ref 26.0–34.0)
MCHC: 33.9 g/dL (ref 30.0–36.0)
MCV: 96.4 fL (ref 80.0–100.0)
Platelets: 257 10*3/uL (ref 150–400)
RBC: 4.41 MIL/uL (ref 4.22–5.81)
RDW: 14.2 % (ref 11.5–15.5)
WBC: 18.1 10*3/uL — ABNORMAL HIGH (ref 4.0–10.5)
nRBC: 0 % (ref 0.0–0.2)

## 2024-03-25 LAB — COMPREHENSIVE METABOLIC PANEL WITH GFR
ALT: 26 U/L (ref 0–44)
AST: 25 U/L (ref 15–41)
Albumin: 2.7 g/dL — ABNORMAL LOW (ref 3.5–5.0)
Alkaline Phosphatase: 57 U/L (ref 38–126)
Anion gap: 8 (ref 5–15)
BUN: 45 mg/dL — ABNORMAL HIGH (ref 8–23)
CO2: 27 mmol/L (ref 22–32)
Calcium: 8.9 mg/dL (ref 8.9–10.3)
Chloride: 100 mmol/L (ref 98–111)
Creatinine, Ser: 1.42 mg/dL — ABNORMAL HIGH (ref 0.61–1.24)
GFR, Estimated: 49 mL/min — ABNORMAL LOW (ref >60)
Glucose, Bld: 127 mg/dL — ABNORMAL HIGH (ref 70–99)
Potassium: 5.3 mmol/L — ABNORMAL HIGH (ref 3.5–5.1)
Sodium: 135 mmol/L (ref 135–145)
Total Bilirubin: 0.8 mg/dL (ref 0.0–1.2)
Total Protein: 6.7 g/dL (ref 6.5–8.1)

## 2024-03-25 LAB — GLUCOSE, CAPILLARY
Glucose-Capillary: 128 mg/dL — ABNORMAL HIGH (ref 70–99)
Glucose-Capillary: 130 mg/dL — ABNORMAL HIGH (ref 70–99)
Glucose-Capillary: 125 mg/dL — ABNORMAL HIGH (ref 70–99)

## 2024-03-25 LAB — PHOSPHORUS: Phosphorus: 5.1 mg/dL — ABNORMAL HIGH (ref 2.5–4.6)

## 2024-03-25 LAB — MAGNESIUM: Magnesium: 2.6 mg/dL — ABNORMAL HIGH (ref 1.7–2.4)

## 2024-03-25 MED ORDER — PANTOPRAZOLE SODIUM 40 MG IV SOLR
40.0000 mg | Freq: Every day | INTRAVENOUS | Status: AC
  Administered 2024-03-25 – 2024-04-08 (×15): 40 mg via INTRAVENOUS
  Filled 2024-03-25 (×15): qty 10

## 2024-03-25 MED ORDER — IOHEXOL 300 MG/ML  SOLN
100.0000 mL | Freq: Once | INTRAMUSCULAR | Status: AC | PRN
  Administered 2024-03-25: 100 mL via INTRAVENOUS

## 2024-03-25 MED ORDER — ACETAMINOPHEN 10 MG/ML IV SOLN
1000.0000 mg | Freq: Four times a day (QID) | INTRAVENOUS | Status: AC
  Administered 2024-03-25 – 2024-03-26 (×4): 1000 mg via INTRAVENOUS
  Filled 2024-03-25 (×4): qty 100

## 2024-03-25 MED ORDER — METOPROLOL TARTRATE 5 MG/5ML IV SOLN
2.5000 mg | Freq: Three times a day (TID) | INTRAVENOUS | Status: DC
  Administered 2024-03-25 – 2024-03-30 (×15): 2.5 mg via INTRAVENOUS
  Filled 2024-03-25 (×15): qty 5

## 2024-03-25 NOTE — Progress Notes (Signed)
 PROGRESS NOTE    Todd Rojas  ZOX:[id number] DOB: 17-Jan-1941 DOA: 03/09/2024 PCP: Barbette Reichmann, MD    Brief Narrative:   Todd Rojas is an 83 year old male with history of CKD stage IIIa, chronic gout, hypertension, hyperlipidemia, interstitial pulmonary disease, bronchiectasis, COPD, who presents emergency department for chief concerns of fever, chills, shortness of breath for 2 weeks.   Vitals in the ED showed temperature of 98.4, respiration rate 25, heart rate 94, blood pressure initially 96/69 and improved to 107/66, SpO2 initially desatted to 88% on room air  Status post exploratory laparotomy with robotic assisted laparoscopic loop ileostomy and decompression of cecum on 4/2.  Tolerated procedure well.  No ischemic gut noted  4/7: Worsening nausea.  Inability to tolerate p.o.  Repeat KUB demonstrating ileus.  General surgery reengaged.  Ordering CT abdomen.  Assessment & Plan:   Principal Problem:   Severe sepsis with acute organ dysfunction (HCC) Active Problems:   Fever   Acute hypoxic respiratory failure (HCC)   CAD (coronary artery disease)   Elevated troponin   Gaseous abdominal distention   COPD (chronic obstructive pulmonary disease) (HCC)   Elevated serum creatinine   Hypertension   Hyperlipidemia, unspecified   Primary osteoarthritis of both knees   Thyroid nodule greater than or equal to 1.5 cm in diameter incidentally noted on imaging study   Dyslipidemia   Personal history of gout   Ogilvie syndrome  Gaseous abdominal distention 2/2 Colonic ileus  Ogilvie's syndrome Patient developed abdominal distention and crampy abdominal pain after presentation. Starting 3/25, Imaging with right colonic gaseous distention without any obvious obstruction.  Colonic distention has been worsening. --GenSurg and GI consulted, dx with Ogilvie's syndrome --neostigmine 2 mg push morning of 3/29 with [name] and [name] present with passing of gas and BM  afterwards, however, colonic distention unchanged, so pt underwent endoscopic decompression on 3/30.  Unfortunately colonic gas re-accumulated. --started TPN on 3/31 --Was clinically improving then had deterioration on 4/7.  Now NPO.  General surgery reengaged. Plan: Repeat abdominal CT.  N.p.o.  May need NGT if ileus becomes overly symptomatic.  Bradycardia, recurrent Initially noted secondary to neostigmine as administration.  Heart rate was down to 20s.  Patient received atropine.  Heart rate improved.  On 4/3 noted to be bradycardic, asymptomatic with heart rate in the 40s to 50s.  Beta-blockade has been held.  Plan: Metoprolol needed for rate control.  Will do IV for now is not tolerating pills    * Severe sepsis with acute organ dysfunction (HCC) 2/2 PNA Elevated lactic acid of 2.6, increased respiration rate, with fever of 102 F at home, organ involvement are cardiac and pulmonary with hypoxic respiratory failure at 88% on room air, elevated high sensitive troponin of 98 and elevated BNP --Imaging concerning for right lower lobe pneumonia, although not much upper respiratory symptoms.  Procalcitonin at 0.352. Respiratory panels were negative. UA does not consistent with UTI Patient received broad-spectrum antibiotics Completed antibiotic course Plan: Hold antibiotics at this time.  Aware of worsening leukocytosis.  Possibly reactive secondary to ileus.  Repeat abdomen CT today  Acute hypoxic respiratory failure (HCC) Currently on 2L --Continue supplemental O2 to keep sats >=92%, wean as tolerated   Elevated troponin 2/2 demand ischemia First high sensitivity troponin was elevated at 98 >>101>175>226,  Suspect secondary to demand ischemia in setting of severe sepsis with acute hypoxic respiratory failure Echocardiogram with EF of 40% and regional wall motion abnormalities, discussed with cardiology and they think  these abnormalities are chronic. Patient was also experiencing  intermittent chest pain over the past couple of months which seems more exertional --cardio consulted, no plan for cardiac workup   CAD (coronary artery disease) With history of anterior STEMI in 2006, transferred to [facility] status post cardiac catheterization with PCI to mid LAD using Cypher stent; currently no longer on Plavix Complete echo on 05/17/2022: Estimated ejection fraction is 55%, trivial mitral regurgitation, trivial tricuspid regurgitation, trivial pulmonary valve regurgitation; aortic valve has normal variant --cont home ASA and statin --cont Toprol   COPD (chronic obstructive pulmonary disease) (HCC) --cont home bronchodilator   Elevated serum creatinine Does not meet acute kidney injury, suspect prerenal in setting of nausea vomiting Serum creatinine baseline is 1.35-1.36/eGFR 52-55, consistent with CKD stage III A -Monitor renal function -Avoid nephrotoxins   Hypertension Blood pressure improving. Holding home furosemide and lisinopril Holding metoprolol secondary to bradycardia Will start amlodipine 5 mg daily, uptitrate as necessary    Hyperlipidemia, unspecified --cont home statin   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 Imaging was obtained which shows very mild right knee effusion and no effusion on left. --Voltaren gel and heat pad   Thyroid nodule greater than or equal to 1.5 cm in diameter incidentally noted on imaging study Incidental finding of a 2.7 cm thyroid nodule. -Thyroid ultrasound with solitary nodule which required outpatient biopsy --TSH wnl   Personal history of gout --cont home allopurinol   Rash Developed morning of 3/28, all over chest, back and bilateral thighs.  Appeared to be allergic rxn.  Rash improved with prednisone.  Completed 5 days of prednisone 40 mg daily.   DVT prophylaxis: Lovenox Code Status: Full Family Communication: Spouse at bedside 4/2, 4/6, 4/7 Disposition Plan: Status is:  Inpatient Remains inpatient appropriate because: Multiple acute issues as above   Level of care: Med-Surg  Consultants:  General surgery  Procedures:  Exploratory laparotomy with loop ileostomy  Antimicrobials: None   Subjective: Seen and examined.  Appears very weak and fatigued this morning.  Complains of abdominal discomfort.  Objective: Vitals:   03/24/24 2000 03/25/24 0327 03/25/24 0500 03/25/24 0839  BP: 125/63 117/66  108/68  Pulse: 76 79  73  Resp: 18 20  20   Temp: 98.6 F (37 C) 97.9 F (36.6 C)  98.1 F (36.7 C)  TempSrc: Oral Oral  Oral  SpO2: 96% 94%  93%  Weight:   84.4 kg   Height:        Intake/Output Summary (Last 24 hours) at 03/25/2024 1125 Last data filed at 03/25/2024 0545 Gross per 24 hour  Intake --  Output 1880 ml  Net -1880 ml   Filed Weights   03/24/24 0440 03/24/24 1121 03/25/24 0500  Weight: 87.6 kg 85.1 kg 84.4 kg    Examination:  General exam: NAD.  Appears weak and fatigued Respiratory system: Lungs clear.  Normal work of breathing.  Room air Cardiovascular system: S1-S2, regular rate and rhythm, no murmurs Gastrointestinal system: Soft, laparotomy incision CDI, ileostomy Central nervous system: Alert and oriented. No focal neurological deficits. Extremities: Decreased power bilateral lower extremities.  Gait not assessed Skin: No rashes, lesions or ulcers Psychiatry: Judgement and insight appear normal. Mood & affect appropriate.     Data Reviewed: I have personally reviewed following labs and imaging studies  CBC: Recent Labs  Lab 03/20/24 0432 03/22/24 0605 03/25/24 0417  WBC 9.6 12.6* 18.1*  HGB 11.1* 12.1* 14.4  HCT 33.2* 36.2* 42.5  MCV 97.4  96.5 96.4  PLT 234 236 257   Basic Metabolic Panel: Recent Labs  Lab 03/19/24 0535 03/19/24 0536 03/19/24 0536 03/20/24 0432 03/21/24 0541 03/21/24 1509 03/22/24 0605 03/23/24 0405 03/25/24 0847  NA  --  138   < > 132* 135  --  133* 134* 135  K  --  4.4   < >  4.7 5.2* 4.8 4.8 4.7 5.3*  CL  --  102   < > 100 102  --  101 103 100  CO2  --  29   < > 26 26  --  27 25 27   GLUCOSE  --  132*   < > 151* 156*  --  106* 125* 127*  BUN  --  46*   < > 47* 46*  --  40* 35* 45*  CREATININE  --  1.25*   < > 0.98 1.07  --  1.08 0.96 1.42*  CALCIUM  --  8.2*   < > 7.8* 8.0*  --  7.9* 8.3* 8.9  MG 2.7*  --   --  2.6* 2.5*  --   --   --  2.6*  PHOS  --  4.5  --  4.2 4.8*  --   --   --  5.1*   < > = values in this interval not displayed.   GFR: Estimated Creatinine Clearance: 39.4 mL/min (A) (by C-G formula based on SCr of 1.42 mg/dL (H)). Liver Function Tests: Recent Labs  Lab 03/19/24 0536 03/20/24 0432 03/21/24 0541 03/25/24 0847  AST  --  19 13* 25  ALT  --  21 16 26   ALKPHOS  --  28* 28* 57  BILITOT  --  0.4 0.4 0.8  PROT  --  5.7* 5.6* 6.7  ALBUMIN 2.2* 2.3* 2.2* 2.7*   No results for input(s): "LIPASE", "AMYLASE" in the last 168 hours. No results for input(s): "AMMONIA" in the last 168 hours. Coagulation Profile: No results for input(s): "INR", "PROTIME" in the last 168 hours. Cardiac Enzymes: No results for input(s): "CKTOTAL", "CKMB", "CKMBINDEX", "TROPONINI" in the last 168 hours. BNP (last 3 results) No results for input(s): "PROBNP" in the last 8760 hours. HbA1C: No results for input(s): "HGBA1C" in the last 72 hours. CBG: Recent Labs  Lab 03/24/24 0557 03/24/24 1239 03/24/24 1721 03/24/24 2348 03/25/24 0542  GLUCAP 121* 156* 138* 139* 128*   Lipid Profile: No results for input(s): "CHOL", "HDL", "LDLCALC", "TRIG", "CHOLHDL", "LDLDIRECT" in the last 72 hours. Thyroid Function Tests: No results for input(s): "TSH", "T4TOTAL", "FREET4", "T3FREE", "THYROIDAB" in the last 72 hours. Anemia Panel: No results for input(s): "VITAMINB12", "FOLATE", "FERRITIN", "TIBC", "IRON", "RETICCTPCT" in the last 72 hours. Sepsis Labs: No results for input(s): "PROCALCITON", "LATICACIDVEN" in the last 168 hours.  Recent Results (from the past  240 hours)  MRSA Next Gen by PCR, Nasal     Status: None   Collection Time: 03/15/24  7:11 PM   Specimen: Nasal Mucosa; Nasal Swab  Result Value Ref Range Status   MRSA by PCR Next Gen NOT DETECTED NOT DETECTED Final    Comment: (NOTE) The GeneXpert MRSA Assay (FDA approved for NASAL specimens only), is one component of a comprehensive MRSA colonization surveillance program. It is not intended to diagnose MRSA infection nor to guide or monitor treatment for MRSA infections. Test performance is not FDA approved in patients less than 16 years old. Performed at [facility], [address], [place], [place] [address]  Radiology Studies: DG Abd 1 View Result Date: 03/25/2024 CLINICAL DATA:  Abdominal distension. History of Ogilvie syndrome. Status post robotic assisted diagnostic laparoscopy on 03/20/2024. EXAM: ABDOMEN - 1 VIEW COMPARISON:  03/21/24 FINDINGS: Since the previous exam there is been interval development of multiple dilated small bowel loops which measure up to 3.9 cm. The colon appears decompressed. No signs of pneumoperitoneum. IMPRESSION: Interval development of multiple dilated small bowel loops. Imaging findings are concerning for either small bowel ileus versus obstruction. Electronically Signed   By: Signa Kell M.D.   On: 03/25/2024 07:48         Scheduled Meds:  allopurinol  300 mg Oral Daily   amLODipine  5 mg Oral Daily   aspirin EC  81 mg Oral Daily   Chlorhexidine Gluconate Cloth  6 each Topical Daily   diclofenac Sodium  2 g Topical TID   enoxaparin (LOVENOX) injection  45 mg Subcutaneous Q24H   feeding supplement (NEPRO CARB STEADY)  237 mL Oral TID BM   insulin aspart  0-9 Units Subcutaneous Q6H   loperamide  2 mg Oral Q8H   metoprolol tartrate  2.5 mg Intravenous Q8H   mometasone-formoterol  2 puff Inhalation BID   multivitamin with minerals  1 tablet Oral Daily   pantoprazole (PROTONIX) IV  40 mg Intravenous Daily   pregabalin   50 mg Oral TID   simethicone  80 mg Oral QID   sodium chloride flush  10-40 mL Intracatheter Q12H   Continuous Infusions:  acetaminophen        LOS: 16 days    Tresa Moore, MD [facility]   If 7PM-7AM, please contact night-coverage  03/25/2024, 11:25 AM

## 2024-03-25 NOTE — Progress Notes (Signed)
 PT Cancellation Note  Patient Details Name: Fate Caster MRN: [id number] DOB: [birth year]   Cancelled Treatment:    Reason Eval/Treat Not Completed: Patient at procedure or test/unavailable Patient off unit at CT. Will re-attempt at later date/time as schedule allows.   Maylon Peppers, PT, DPT Physical Therapist - [facility]  [facility]    Flavia Bruss A [place] Folden 03/25/2024, 2:29 PM

## 2024-03-25 NOTE — Progress Notes (Signed)
 Pt seen and examined.  Events noted .  Abd Mildy distended. Ostomy digitalized and is patent and beefy red.   A/P Ileus no surgical intervention Supportive care

## 2024-03-25 NOTE — TOC Progression Note (Signed)
 Transition of Care [facility]) - Progression Note    Patient Details  Name: Todd Rojas MRN: [id number] Date of Birth: Aug 09, 1941  Transition of Care [facility]) CM/SW Contact  Margarito Liner, LCSW Phone Number: 03/25/2024, 4:38 PM  Clinical Narrative:   TOC continues to follow progress.  Expected Discharge Plan: Skilled Nursing Facility Barriers to Discharge: Continued Medical Work up  Expected Discharge Plan and Services     Post Acute Care Choice:  (TBD) Living arrangements for the past 2 months: Single Family Home                                       Social Determinants of Health (SDOH) Interventions SDOH Screenings   Food Insecurity: No Food Insecurity (03/10/2024)  Housing: Low Risk  (03/10/2024)  Transportation Needs: No Transportation Needs (03/10/2024)  Utilities: Not At Risk (03/10/2024)  Depression (PHQ2-9): Low Risk  (07/12/2021)  Financial Resource Strain: Low Risk  (10/23/2023)   Received from [facility]  Social Connections: Socially Integrated (03/10/2024)  Tobacco Use: Medium Risk (03/25/2024)    Readmission Risk Interventions     No data to display

## 2024-03-25 NOTE — Progress Notes (Addendum)
 [facility] SURGICAL PROGRESS NOTE  Hospital Day(s): 16.   Post op day(s): 5 Days Post-Op.   Interval History:  Patient seen and examined Late yesterday and overnight, he developed significant nausea and emesis  Patient reports this morning he feels okay, just worn out No significant abdominal pain Continues to have nausea, burping/hiccups Leukocytosis to 18.1K this morning Hgb 14.4 AKI with sCr - 1.42; UO - 1080 ccs Hyperkalemia to 5.3 NPO this morning KUB appears consistent with ileus Ileostomy output slowed significantly   Vital signs in last 24 hours: [min-max] current  Temp:  [97.9 F (36.6 C)-98.6 F (37 C)] 98.1 F (36.7 C) (04/07 0839) Pulse Rate:  [73-92] 73 (04/07 0839) Resp:  [18-20] 20 (04/07 0839) BP: (108-125)/(63-73) 108/68 (04/07 0839) SpO2:  [93 %-97 %] 93 % (04/07 0839) Weight:  [84.4 kg-85.1 kg] 84.4 kg (04/07 0500)     Height: 5\' 9"  (175.3 cm) Weight: 84.4 kg BMI (Calculated): 27.46   Intake/Output last 2 shifts:  04/06 0701 - 04/07 0700 In: -  Out: 1880 [Urine:1080; Emesis/NG output:450; Stool:350]   Physical Exam:  Constitutional: alert, cooperative and no distress  Respiratory: breathing non-labored at rest  Cardiovascular: regular rate and sinus rhythm  Gastrointestinal: soft, he does not appear overtly tender, he is distended and tympanic, no rebound/guarding. Ileostomy in RLQ; minimal gas in bag, no stool Integumentary: Laparoscopic incisions are CDI with dermabond   Labs:     Latest Ref Rng & Units 03/25/2024    4:17 AM 03/22/2024    6:05 AM 03/20/2024    4:32 AM  CBC  WBC 4.0 - 10.5 K/uL 18.1  12.6  9.6   Hemoglobin 13.0 - 17.0 g/dL 04.5  40.9  81.1   Hematocrit 39.0 - 52.0 % 42.5  36.2  33.2   Platelets 150 - 400 K/uL 257  236  234       Latest Ref Rng & Units 03/23/2024    4:05 AM 03/22/2024    6:05 AM 03/21/2024    3:09 PM  CMP  Glucose 70 - 99 mg/dL 914  782    BUN 8 - 23 mg/dL 35  40    Creatinine 9.56 - 1.24 mg/dL  2.13  0.86    Sodium 578 - 145 mmol/L 134  133    Potassium 3.5 - 5.1 mmol/L 4.7  4.8  4.8   Chloride 98 - 111 mmol/L 103  101    CO2 22 - 32 mmol/L 25  27    Calcium 8.9 - 10.3 mg/dL 8.3  7.9       Imaging studies:   KUB (03/25/2024) personally reviewed with gaseous distension of stomach and small bowel, likely ileus, colon is decompressed, and radiologist report reviewed below:  IMPRESSION: Interval development of multiple dilated small bowel loops. Imaging findings are concerning for either small bowel ileus versus obstruction.    Assessment/Plan:  83 y.o. male with post-operative ileus 5 Days Post-Op s/p robotic assisted laparoscopic loop ileostomy with decompression of cecum for Ogilvie Syndrome, complicated by pertinent comorbidities including COPD, asthma, CAD, history of MI, CHF, HTN, HLD.    - Clinical picture appears consistent with small bowel post-operative ileus. I do wish to ensure no other missed intra-abdominal etiologies given this clinical change and leukocytosis. Will plan for CT Abdomen/Pelvis. I doubt he will tolerate PO contrast  - He will need to be NPO; I will give him another 24 hours or so before restarting TPN  - Correct electrolyte derangements  -  Discussed role for NGT decompression in this setting should nausea/emesis recur   - No need for emergent surgical intervention   - Monitor abdominal examination; on-going ileostomy function   - Pain control prn; minimize narcotics as feasible - Antiemetics prn   - encouraged frequent mobilization as feasible  - Further management per primary service; we will follow   - Discharge Planning: Not ready for discharge, now with likely ileus   All of the above findings and recommendations were discussed with the patient, patient's family(wife at bedside), and the medical team, and all of their questions were answered to their expressed satisfaction.  -- Lynden Oxford, PA-C [facility] 03/25/2024,  8:43 AM M-F: 7am - 4pm

## 2024-03-26 DIAGNOSIS — R6520 Severe sepsis without septic shock: Secondary | ICD-10-CM | POA: Diagnosis not present

## 2024-03-26 DIAGNOSIS — A419 Sepsis, unspecified organism: Secondary | ICD-10-CM | POA: Diagnosis not present

## 2024-03-26 LAB — COMPREHENSIVE METABOLIC PANEL WITH GFR
ALT: 25 U/L (ref 0–44)
AST: 22 U/L (ref 15–41)
Albumin: 2.7 g/dL — ABNORMAL LOW (ref 3.5–5.0)
Alkaline Phosphatase: 63 U/L (ref 38–126)
Anion gap: 8 (ref 5–15)
BUN: 49 mg/dL — ABNORMAL HIGH (ref 8–23)
CO2: 27 mmol/L (ref 22–32)
Calcium: 8.9 mg/dL (ref 8.9–10.3)
Chloride: 102 mmol/L (ref 98–111)
Creatinine, Ser: 1.46 mg/dL — ABNORMAL HIGH (ref 0.61–1.24)
GFR, Estimated: 47 mL/min — ABNORMAL LOW (ref >60)
Glucose, Bld: 107 mg/dL — ABNORMAL HIGH (ref 70–99)
Potassium: 5 mmol/L (ref 3.5–5.1)
Sodium: 137 mmol/L (ref 135–145)
Total Bilirubin: 1.1 mg/dL (ref 0.0–1.2)
Total Protein: 6.6 g/dL (ref 6.5–8.1)

## 2024-03-26 LAB — GLUCOSE, CAPILLARY
Glucose-Capillary: 109 mg/dL — ABNORMAL HIGH (ref 70–99)
Glucose-Capillary: 117 mg/dL — ABNORMAL HIGH (ref 70–99)
Glucose-Capillary: 117 mg/dL — ABNORMAL HIGH (ref 70–99)
Glucose-Capillary: 120 mg/dL — ABNORMAL HIGH (ref 70–99)
Glucose-Capillary: 128 mg/dL — ABNORMAL HIGH (ref 70–99)

## 2024-03-26 LAB — CBC
HCT: 41.2 % (ref 39.0–52.0)
Hemoglobin: 13.6 g/dL (ref 13.0–17.0)
MCH: 32.5 pg (ref 26.0–34.0)
MCHC: 33 g/dL (ref 30.0–36.0)
MCV: 98.6 fL (ref 80.0–100.0)
Platelets: 260 10*3/uL (ref 150–400)
RBC: 4.18 MIL/uL — ABNORMAL LOW (ref 4.22–5.81)
RDW: 14.6 % (ref 11.5–15.5)
WBC: 13.3 10*3/uL — ABNORMAL HIGH (ref 4.0–10.5)
nRBC: 0 % (ref 0.0–0.2)

## 2024-03-26 LAB — MAGNESIUM: Magnesium: 2.7 mg/dL — ABNORMAL HIGH (ref 1.7–2.4)

## 2024-03-26 LAB — PHOSPHORUS: Phosphorus: 5.4 mg/dL — ABNORMAL HIGH (ref 2.5–4.6)

## 2024-03-26 MED ORDER — ALBUMIN HUMAN 25 % IV SOLN
25.0000 g | Freq: Once | INTRAVENOUS | Status: AC
  Administered 2024-03-26: 25 g via INTRAVENOUS
  Filled 2024-03-26: qty 100

## 2024-03-26 MED ORDER — ACETAMINOPHEN 500 MG PO TABS
1000.0000 mg | ORAL_TABLET | Freq: Four times a day (QID) | ORAL | Status: DC
  Administered 2024-03-27 – 2024-03-29 (×5): 1000 mg via ORAL
  Filled 2024-03-26 (×9): qty 2

## 2024-03-26 MED ORDER — ENOXAPARIN SODIUM 40 MG/0.4ML IJ SOSY
40.0000 mg | PREFILLED_SYRINGE | INTRAMUSCULAR | Status: DC
  Administered 2024-03-26 – 2024-04-08 (×14): 40 mg via SUBCUTANEOUS
  Filled 2024-03-26 (×15): qty 0.4

## 2024-03-26 MED ORDER — DEXTROSE-SODIUM CHLORIDE 5-0.9 % IV SOLN: INTRAVENOUS | Status: AC

## 2024-03-26 MED ORDER — METOCLOPRAMIDE HCL 5 MG/ML IJ SOLN
10.0000 mg | Freq: Once | INTRAMUSCULAR | Status: AC
  Administered 2024-03-26: 10 mg via INTRAVENOUS
  Filled 2024-03-26: qty 2

## 2024-03-26 MED ORDER — BOOST / RESOURCE BREEZE PO LIQD CUSTOM
1.0000 | Freq: Three times a day (TID) | ORAL | Status: DC
  Administered 2024-03-26: 1 via ORAL

## 2024-03-26 MED ORDER — SODIUM CHLORIDE 0.9 % IV SOLN
12.5000 mg | Freq: Four times a day (QID) | INTRAVENOUS | Status: DC | PRN
  Administered 2024-03-26 – 2024-04-04 (×8): 12.5 mg via INTRAVENOUS
  Filled 2024-03-26: qty 12.5
  Filled 2024-03-26: qty 0.5
  Filled 2024-03-26 (×6): qty 12.5

## 2024-03-26 NOTE — Plan of Care (Signed)
  Problem: Pain Managment: Goal: General experience of comfort will improve and/or be controlled Outcome: Progressing   Problem: Safety: Goal: Ability to remain free from injury will improve Outcome: Progressing   Problem: Skin Integrity: Goal: Risk for impaired skin integrity will decrease Outcome: Progressing

## 2024-03-26 NOTE — Progress Notes (Signed)
 Nutrition Follow Up Note   DOCUMENTATION CODES:   Not applicable  INTERVENTION:   Boost Breeze po TID, each supplement provides 250 kcal and 9 grams of protein  Nepro Shake po TID with diet advancement, each supplement provides 425 kcal and 19 grams protein  MVI po daily   Pt at high refeed risk; recommend monitor potassium, magnesium and phosphorus labs daily until stable  Daily weights    NUTRITION DIAGNOSIS:   Inadequate oral intake related to altered GI function as evidenced by other (comment) (pt on NPO/clear liquid diet since admission). -ongoing   GOAL:   Patient will meet greater than or equal to 90% of their needs -not met   MONITOR:   PO intake, Supplement acceptance, Diet advancement, Labs, Weight trends, I & O's, Skin  ASSESSMENT:   83 y/o male with h/o CAD, COPD, HLD, gout, DDD, GERD, CHF, thyroid nodule, kidney stones, B12 deficiency and CKD III who is admitted with PNA, sepsis and Ogilvie syndrome s/p colonic decompression 3/30 & s/p robotic assisted laparoscopic loop ileostomy with decompression of cecum 4/2 complicated by post op ileus.  Pt developed nausea and vomiting overnight 4/6 and was made NPO again. CT scan reporting post op ileus. Pt reports he is feeling better today. No further nausea or vomiting. Pt is having bowel function. Pt remains distended. Pt initiated on clear liquid diet today. Pt is ordered for Boost Breeze and MVI. Recommend TPN if pt is unable to tolerate the clear liquid diet. Pt remains at refeed risk. Per chart, pt is down ~3lbs since admission. IVF initiated today.   Medications reviewed and include: allopurinol, aspirin, lovenox, insulin, MVI, protonix, simethicone, NaCl w/ 5% dextrose @75ml /hr  Labs reviewed: K 5.0 wnl, BUN 49(H), creat 1.46(H), P 5.4(H), Mg 2.7(H) Wbc- 13.3(H) Cbgs- 117, 109, 117 x 24 hrs   Diet Order:   Diet Order             Diet clear liquid Fluid consistency: Thin  Diet effective now                   EDUCATION NEEDS:   Education needs have been addressed  Skin:  Skin Assessment: Reviewed RN Assessment (incision abdomen)  Last BM:  4/8- via ostomy  Height:   Ht Readings from Last 1 Encounters:  03/15/24 5\' 9"  (1.753 m)    Weight:   Wt Readings from Last 1 Encounters:  03/26/24 82.2 kg    Ideal Body Weight:  72.7 kg  BMI:  Body mass index is 26.77 kg/m.  Estimated Nutritional Needs:   Kcal:  1900-2200kcal/day  Protein:  95-110g/day  Fluid:  1.9-2.2L/day  [name], RD, LDN If unable to be reached, please send secure chat to "RD inpatient" available from 8:00a-4:00p daily

## 2024-03-26 NOTE — Progress Notes (Signed)
 PROGRESS NOTE    Todd Rojas  ZOX:[id number] DOB: 1941-02-16 DOA: 03/09/2024 PCP: Barbette Reichmann, MD    Brief Narrative:   Todd Rojas is an 83 year old male with history of CKD stage IIIa, chronic gout, hypertension, hyperlipidemia, interstitial pulmonary disease, bronchiectasis, COPD, who presents emergency department for chief concerns of fever, chills, shortness of breath for 2 weeks.   Vitals in the ED showed temperature of 98.4, respiration rate 25, heart rate 94, blood pressure initially 96/69 and improved to 107/66, SpO2 initially desatted to 88% on room air  Status post exploratory laparotomy with robotic assisted laparoscopic loop ileostomy and decompression of cecum on 4/2.  Tolerated procedure well.  No ischemic gut noted  4/7: Worsening nausea.  Inability to tolerate p.o.  Repeat KUB demonstrating ileus.  General surgery reengaged.  Ordering CT abdomen.  4/8: Conservative management per surgery.  No evidence of obstruction.  CT reassuring.  Diet scaled back to n.p.o. for now.  Clear liquids later today if stable.  Assessment & Plan:   Principal Problem:   Severe sepsis with acute organ dysfunction (HCC) Active Problems:   Fever   Acute hypoxic respiratory failure (HCC)   CAD (coronary artery disease)   Elevated troponin   Gaseous abdominal distention   COPD (chronic obstructive pulmonary disease) (HCC)   Elevated serum creatinine   Hypertension   Hyperlipidemia, unspecified   Primary osteoarthritis of both knees   Thyroid nodule greater than or equal to 1.5 cm in diameter incidentally noted on imaging study   Dyslipidemia   Personal history of gout   Ogilvie syndrome  Gaseous abdominal distention 2/2 Colonic ileus  Ogilvie's syndrome Patient developed abdominal distention and crampy abdominal pain after presentation. Starting 3/25, Imaging with right colonic gaseous distention without any obvious obstruction.  Colonic distention has been  worsening. --GenSurg and GI consulted, dx with Ogilvie's syndrome --neostigmine 2 mg push morning of 3/29 with [name] and [name] present with passing of gas and BM afterwards, however, colonic distention unchanged, so pt underwent endoscopic decompression on 3/30.  Unfortunately colonic gas re-accumulated. --started TPN on 3/31 --Was clinically improving then had deterioration on 4/7.  Now NPO.  General surgery reengaged. Plan: N.p.o. for now.  Surgery reengaged.  Potentially can do clear liquids this afternoon.  Bradycardia, recurrent Initially noted secondary to neostigmine as administration.  Heart rate was down to 20s.  Patient received atropine.  Heart rate improved.  On 4/3 noted to be bradycardic, asymptomatic with heart rate in the 40s to 50s.  Beta-blockade has been held.  Plan: Metoprolol switched to IV for rate control.  Can consider transition back to oral beta-blocker once tolerating p.o.    * Severe sepsis with acute organ dysfunction (HCC) 2/2 PNA Elevated lactic acid of 2.6, increased respiration rate, with fever of 102 F at home, organ involvement are cardiac and pulmonary with hypoxic respiratory failure at 88% on room air, elevated high sensitive troponin of 98 and elevated BNP --Imaging concerning for right lower lobe pneumonia, although not much upper respiratory symptoms.  Procalcitonin at 0.352. Respiratory panels were negative. UA does not consistent with UTI Patient received broad-spectrum antibiotics Completed antibiotic course Plan: Hold antibiotics at this time.  Leukocytosis downtrending.  Suspect reactive.  No evidence of infection.    Acute hypoxic respiratory failure (HCC) Resolved.  On room air.  Continue to stress incentive spirometry use.   Elevated troponin 2/2 demand ischemia First high sensitivity troponin was elevated at 98 >>101>175>226,  Suspect secondary to  demand ischemia in setting of severe sepsis with acute hypoxic respiratory  failure Echocardiogram with EF of 40% and regional wall motion abnormalities, discussed with cardiology and they think these abnormalities are chronic. Patient was also experiencing intermittent chest pain over the past couple of months which seems more exertional --cardio consulted, no plan for cardiac workup   CAD (coronary artery disease) With history of anterior STEMI in 2006, transferred to [facility] status post cardiac catheterization with PCI to mid LAD using Cypher stent; currently no longer on Plavix Complete echo on 05/17/2022: Estimated ejection fraction is 55%, trivial mitral regurgitation, trivial tricuspid regurgitation, trivial pulmonary valve regurgitation; aortic valve has normal variant --cont home ASA and statin --cont Toprol, currently on IV   COPD (chronic obstructive pulmonary disease) (HCC) --cont home bronchodilator   Elevated serum creatinine Does not meet acute kidney injury, suspect prerenal in setting of nausea vomiting Serum creatinine baseline is 1.35-1.36/eGFR 52-55, consistent with CKD stage III A -Monitor renal function -Avoid nephrotoxins   Hypertension Hold furosemide and lisinopril on hold Beta-blocker low-dose IV Norvasc 5 mg daily  Hyperlipidemia, unspecified --cont home statin   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 Imaging was obtained which shows very mild right knee effusion and no effusion on left. --Voltaren gel and heat pad   Thyroid nodule greater than or equal to 1.5 cm in diameter incidentally noted on imaging study Incidental finding of a 2.7 cm thyroid nodule. -Thyroid ultrasound with solitary nodule which required outpatient biopsy --TSH wnl   Personal history of gout --cont home allopurinol   Rash Developed morning of 3/28, all over chest, back and bilateral thighs.  Appeared to be allergic rxn.  Rash improved with prednisone.  Completed 5 days of prednisone 40 mg daily.   DVT prophylaxis:  Lovenox Code Status: Full Family Communication: Spouse at bedside 4/2, 4/6, 4/7, 4/8 Disposition Plan: Status is: Inpatient Remains inpatient appropriate because: Multiple acute issues as above   Level of care: Med-Surg  Consultants:  General surgery  Procedures:  Exploratory laparotomy with loop ileostomy  Antimicrobials: None   Subjective: Seen and examined.  Energy level appears low but overall stable.  No complaints of abdominal discomfort this morning.  Objective: Vitals:   03/26/24 0037 03/26/24 0251 03/26/24 0438 03/26/24 0801  BP:  121/62 109/67 111/65  Pulse:  79 63 66  Resp:  17 18 16   Temp:  (!) 97.5 F (36.4 C) 97.6 F (36.4 C) 97.7 F (36.5 C)  TempSrc:  Oral  Oral  SpO2:  96% 92% 97%  Weight: 82.2 kg     Height:        Intake/Output Summary (Last 24 hours) at 03/26/2024 1033 Last data filed at 03/26/2024 0054 Gross per 24 hour  Intake --  Output 600 ml  Net -600 ml   Filed Weights   03/24/24 1121 03/25/24 0500 03/26/24 0037  Weight: 85.1 kg 84.4 kg 82.2 kg    Examination:  General exam: No acute distress.  Appears very fatigued Respiratory system: Lungs clear.  Normal work of breathing.  Room air Cardiovascular system: S1-S2, bradycardic, no murmur Gastrointestinal system: Soft, laparotomy incision CDI, ileostomy Central nervous system: Alert and oriented. No focal neurological deficits. Extremities: Decreased power bilateral lower extremities.  Gait not assessed Skin: No rashes, lesions or ulcers Psychiatry: Judgement and insight appear normal. Mood & affect appropriate.     Data Reviewed: I have personally reviewed following labs and imaging studies  CBC: Recent Labs  Lab 03/20/24  9147 03/22/24 0605 03/25/24 0417 03/26/24 0409  WBC 9.6 12.6* 18.1* 13.3*  HGB 11.1* 12.1* 14.4 13.6  HCT 33.2* 36.2* 42.5 41.2  MCV 97.4 96.5 96.4 98.6  PLT 234 236 257 260   Basic Metabolic Panel: Recent Labs  Lab 03/20/24 0432 03/21/24 0541  03/21/24 1509 03/22/24 0605 03/23/24 0405 03/25/24 0847 03/26/24 0409  NA 132* 135  --  133* 134* 135 137  K 4.7 5.2* 4.8 4.8 4.7 5.3* 5.0  CL 100 102  --  101 103 100 102  CO2 26 26  --  27 25 27 27   GLUCOSE 151* 156*  --  106* 125* 127* 107*  BUN 47* 46*  --  40* 35* 45* 49*  CREATININE 0.98 1.07  --  1.08 0.96 1.42* 1.46*  CALCIUM 7.8* 8.0*  --  7.9* 8.3* 8.9 8.9  MG 2.6* 2.5*  --   --   --  2.6* 2.7*  PHOS 4.2 4.8*  --   --   --  5.1* 5.4*   GFR: Estimated Creatinine Clearance: 38.3 mL/min (A) (by C-G formula based on SCr of 1.46 mg/dL (H)). Liver Function Tests: Recent Labs  Lab 03/20/24 0432 03/21/24 0541 03/25/24 0847 03/26/24 0409  AST 19 13* 25 22  ALT 21 16 26 25   ALKPHOS 28* 28* 57 63  BILITOT 0.4 0.4 0.8 1.1  PROT 5.7* 5.6* 6.7 6.6  ALBUMIN 2.3* 2.2* 2.7* 2.7*   No results for input(s): "LIPASE", "AMYLASE" in the last 168 hours. No results for input(s): "AMMONIA" in the last 168 hours. Coagulation Profile: No results for input(s): "INR", "PROTIME" in the last 168 hours. Cardiac Enzymes: No results for input(s): "CKTOTAL", "CKMB", "CKMBINDEX", "TROPONINI" in the last 168 hours. BNP (last 3 results) No results for input(s): "PROBNP" in the last 8760 hours. HbA1C: No results for input(s): "HGBA1C" in the last 72 hours. CBG: Recent Labs  Lab 03/25/24 0542 03/25/24 1215 03/25/24 1810 03/26/24 0017 03/26/24 0535  GLUCAP 128* 130* 125* 117* 109*   Lipid Profile: No results for input(s): "CHOL", "HDL", "LDLCALC", "TRIG", "CHOLHDL", "LDLDIRECT" in the last 72 hours. Thyroid Function Tests: No results for input(s): "TSH", "T4TOTAL", "FREET4", "T3FREE", "THYROIDAB" in the last 72 hours. Anemia Panel: No results for input(s): "VITAMINB12", "FOLATE", "FERRITIN", "TIBC", "IRON", "RETICCTPCT" in the last 72 hours. Sepsis Labs: No results for input(s): "PROCALCITON", "LATICACIDVEN" in the last 168 hours.  No results found for this or any previous visit (from  the past 240 hours).        Radiology Studies: CT ABDOMEN PELVIS W CONTRAST Result Date: 03/25/2024 CLINICAL DATA:  Post robotic laparoscopic loop ileostomy for decompression of cecum related Ogilvie's disease. EXAM: CT ABDOMEN AND PELVIS WITH CONTRAST TECHNIQUE: Multidetector CT imaging of the abdomen and pelvis was performed using the standard protocol following bolus administration of intravenous contrast. RADIATION DOSE REDUCTION: This exam was performed according to the departmental dose-optimization program which includes automated exposure control, adjustment of the mA and/or kV according to patient size and/or use of iterative reconstruction technique. CONTRAST:  OMNIPAQUE IOHEXOL 300 MG/ML  SOLN COMPARISON:  CT 03/15/2024 FINDINGS: Lower chest: Peribronchial thickening and mild bronchiectasis lung bases. Evidence of active pneumonia. Mild basilar atelectasis on the RIGHT. Hepatobiliary: Several low-density lesions LEFT hepatic lobe too small to characterize. Subtle enhancing lesion posterior RIGHT hepatic lobe measuring 8 mm on image 20/2 cannot be fully characterize. Pancreas: Pancreas is normal. No ductal dilatation. No pancreatic inflammation. Spleen: Normal spleen Adrenals/urinary tract: Adrenal glands are  normal. Cortical scarring of the LEFT kidney. Small simple fluid attenuation cyst of the RIGHT kidney measuring 14 mm on postcontrast imaging ureters and bladder normal. Stomach/Bowel: Fluid stomach is fluid-filled. Duodenum is normal. The small bowel is fluid-filled. The small bowel is mildly dilated to 3.3 cm (image 66/2). No evidence of high-grade obstruction. 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 The distal ileum and colon are collapsed. Vascular/Lymphatic: Abdominal aorta is normal caliber. No periportal or retroperitoneal adenopathy. No pelvic adenopathy. Reproductive: Enlarged prostate gland Other: No free fluid. Musculoskeletal: No aggressive osseous lesion. IMPRESSION: 1.  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 Small bowel proximal to the ostomy is mildly dilated and fluid-filled. Favor postoperative ileus over bowel obstruction. 2. The distal small bowel and colon are collapsed. 3. Moderate volume of fluid within this stomach. 4. Basilar atelectasis. 5. Several small lesion liver too small to characterize but favored benign. Electronically Signed   By: Genevive Bi M.D.   On: 03/25/2024 15:22   DG Abd 1 View Result Date: 03/25/2024 CLINICAL DATA:  Abdominal distension. History of Ogilvie syndrome. Status post robotic assisted diagnostic laparoscopy on 03/20/2024. EXAM: ABDOMEN - 1 VIEW COMPARISON:  03/21/24 FINDINGS: Since the previous exam there is been interval development of multiple dilated small bowel loops which measure up to 3.9 cm. The colon appears decompressed. No signs of pneumoperitoneum. IMPRESSION: Interval development of multiple dilated small bowel loops. Imaging findings are concerning for either small bowel ileus versus obstruction. Electronically Signed   By: Signa Kell M.D.   On: 03/25/2024 07:48         Scheduled Meds:  allopurinol  300 mg Oral Daily   amLODipine  5 mg Oral Daily   aspirin EC  81 mg Oral Daily   Chlorhexidine Gluconate Cloth  6 each Topical Daily   diclofenac Sodium  2 g Topical TID   enoxaparin (LOVENOX) injection  40 mg Subcutaneous Q24H   feeding supplement  1 Container Oral TID BM   feeding supplement (NEPRO CARB STEADY)  237 mL Oral TID BM   insulin aspart  0-9 Units Subcutaneous Q6H   metoprolol tartrate  2.5 mg Intravenous Q8H   mometasone-formoterol  2 puff Inhalation BID   multivitamin with minerals  1 tablet Oral Daily   pantoprazole (PROTONIX) IV  40 mg Intravenous Daily   pregabalin  50 mg Oral TID   simethicone  80 mg Oral QID   sodium chloride flush  10-40 mL Intracatheter Q12H   Continuous Infusions:  dextrose 5 % and 0.9 % NaCl 75 mL/hr at 03/26/24 0656      LOS: 17 days    Tresa Moore, MD [facility]   If 7PM-7AM, please contact night-coverage  03/26/2024, 10:33 AM

## 2024-03-26 NOTE — Consult Note (Signed)
 WOC Nurse ostomy follow up Stoma type/location: RLQ, ileostomy  Stomal assessment/size: 1 3/4" round, budded, os at 5 o'clock  Peristomal assessment: intact  Treatment options for stomal/peristomal skin: 2" barrier ring Output pouch full; liquid stool; emptied 350cc  Ostomy pouching: 1pc.flat with 2" barrier ring  Education provided:  Explained role of ostomy nurse and creation of stoma  Explained stoma characteristics (budded, flush, color, texture, care) Demonstrated pouch change (cutting new skin barrier, measuring stoma, cleaning peristomal skin and stoma, use of barrier ring) Education on emptying when 1/3 to 1/2 full and how to empty Demonstrated "burping" flatus from pouch Demonstrated use of wick to clean spout  Discussed bathing, diet, gas, medication use, constipation, diarrhea, dehydration  Discussed risk of peristomal hernia Provided patient with [employer] and marked items currently using Wife cut new skin barrier, otherwise observed pouch change. She can open and close using lock and roll. Patient is not as engaged to learn.    Enrolled patient in [place] Secure Start Discharge program: Yes  WOC Nurse will follow along with you for continued support with ostomy teaching and care Maleni Seyer [facility] MSN, RN, [place], CNS, [place] [phone]

## 2024-03-26 NOTE — Progress Notes (Signed)
 [facility] SURGICAL PROGRESS NOTE  Hospital Day(s): 17.   Post op day(s): 6 Days Post-Op.   Interval History:  Patient seen and examined No events overnight  Patient reports he feels a little better this AM; tired No longer with nausea/emesis Leukocytosis  improving; now 13.3K Hgb 13.6 AKI with sCr - 1.46; UO - 600 ccs Electrolyte derangements improving  NPO this morning Ileostomy bag full this AM; no output recorded however  Vital signs in last 24 hours: [min-max] current  Temp:  [97.5 F (36.4 C)-98.6 F (37 C)] 97.6 F (36.4 C) (04/08 0438) Pulse Rate:  [63-79] 63 (04/08 0438) Resp:  [16-20] 18 (04/08 0438) BP: (108-121)/(62-71) 109/67 (04/08 0438) SpO2:  [92 %-96 %] 92 % (04/08 0438) Weight:  [82.2 kg] 82.2 kg (04/08 0037)     Height: 5\' 9"  (175.3 cm) Weight: 82.2 kg BMI (Calculated): 26.76   Intake/Output last 2 shifts:  04/07 0701 - 04/08 0700 In: -  Out: 600 [Urine:600]   Physical Exam:  Constitutional: alert, cooperative and no distress  Respiratory: breathing non-labored at rest  Cardiovascular: regular rate and sinus rhythm  Gastrointestinal: soft, he does not appear overtly tender, he is distension improving, no rebound/guarding. Ileostomy in RLQ; now with significant liquid stool in bag  Integumentary: Laparoscopic incisions are CDI with dermabond   Labs:     Latest Ref Rng & Units 03/26/2024    4:09 AM 03/25/2024    4:17 AM 03/22/2024    6:05 AM  CBC  WBC 4.0 - 10.5 K/uL 13.3  18.1  12.6   Hemoglobin 13.0 - 17.0 g/dL 41.3  24.4  01.0   Hematocrit 39.0 - 52.0 % 41.2  42.5  36.2   Platelets 150 - 400 K/uL 260  257  236       Latest Ref Rng & Units 03/26/2024    4:09 AM 03/25/2024    8:47 AM 03/23/2024    4:05 AM  CMP  Glucose 70 - 99 mg/dL 272  536  644   BUN 8 - 23 mg/dL 49  45  35   Creatinine 0.61 - 1.24 mg/dL 0.34  7.42  5.95   Sodium 135 - 145 mmol/L 137  135  134   Potassium 3.5 - 5.1 mmol/L 5.0  5.3  4.7   Chloride 98 - 111  mmol/L 102  100  103   CO2 22 - 32 mmol/L 27  27  25    Calcium 8.9 - 10.3 mg/dL 8.9  8.9  8.3   Total Protein 6.5 - 8.1 g/dL 6.6  6.7    Total Bilirubin 0.0 - 1.2 mg/dL 1.1  0.8    Alkaline Phos 38 - 126 U/L 63  57    AST 15 - 41 U/L 22  25    ALT 0 - 44 U/L 25  26       Imaging studies:  No new imaging studies   Assessment/Plan:  83 y.o. male with post-operative ileus 6 Days Post-Op s/p robotic assisted laparoscopic loop ileostomy with decompression of cecum for Ogilvie Syndrome, complicated by pertinent comorbidities including COPD, asthma, CAD, history of MI, CHF, HTN, HLD.    - He has had return of bowel function this morning, still distended, but suspect ileus improving. I do think we still need to take this slow. He is okay to do sips of water, ice, boost breeze, hard candies, gum. If he does well this morning, with continued bowel function, and no nausea/emesis, we  can trial CLD later today  - I would like to try and hold off on restarting TPN for now   - No need for replacement of NGT at this time  - No need for emergent surgical intervention   - Will get morning KUB   - Monitor abdominal examination; on-going ileostomy function   - Pain control prn; minimize narcotics as feasible - Antiemetics prn   - Encouraged frequent mobilization as feasible - therapies following   - Further management per primary service; we will follow   - Discharge Planning: Not ready for discharge, ileus improving slowly   All of the above findings and recommendations were discussed with the patient, patient's family(wife at bedside), and the medical team, and all of their questions were answered to their expressed satisfaction.  -- Lynden Oxford, PA-C [facility] 03/26/2024, 7:26 AM M-F: 7am - 4pm

## 2024-03-26 NOTE — Progress Notes (Signed)
 Physical Therapy Treatment Patient Details Name: Tanor Glaspy MRN: [id number] DOB: 02-10-1941 Today's Date: 03/26/2024   History of Present Illness Pt is an 83 year old male   MD work up including sepsis, acute hypoxic respiratory failure, PNA    Pmh significant for CKD stage IIIa, chronic gout, hypertension, hyperlipidemia, interstitial pulmonary disease, bronchiectasis, COPD. Pt is s/p exploratory lapileostomy and decompression of cecum on 4/2.    PT Comments  Patient semi reclined in bed and asleep on arrival, however easily awakens to voice. Agreeable to participate in PT treatment session. Completed bed mobility with minA and sit to stand with CGA. Ambulated 120' in hallway with RW and CGA, however endorsing dizziness/lightheadedness which likely due to recent nausea medication given. Discharge plan remains appropriate.     If plan is discharge home, recommend the following: Assist for transportation;Help with stairs or ramp for entrance;A little help with bathing/dressing/bathroom;A little help with walking and/or transfers;Assistance with cooking/housework   Can travel by private vehicle     Yes  Equipment Recommendations  Rolling Miyah Hampshire (2 wheels);BSC/3in1    Recommendations for Other Services       Precautions / Restrictions Precautions Precautions: Fall Recall of Precautions/Restrictions: Intact Restrictions Weight Bearing Restrictions Per Provider Order: No     Mobility  Bed Mobility Overal bed mobility: Needs Assistance Bed Mobility: Supine to Sit     Supine to sit: Min assist     General bed mobility comments: assist for trunk elevation    Transfers Overall transfer level: Needs assistance Equipment used: Rolling Jamesa Tedrick (2 wheels) Transfers: Sit to/from Stand Sit to Stand: Contact guard assist                Ambulation/Gait Ambulation/Gait assistance: Contact guard assist Gait Distance (Feet): 120 Feet Assistive device: Rolling Brayley Mackowiak (2  wheels) Gait Pattern/deviations: Step-through pattern, Decreased stride length Gait velocity: decreased     General Gait Details: CGA for safety. Slow steady gait. Complaining of mild dizziness/lightheadedness   Stairs             Wheelchair Mobility     Tilt Bed    Modified Rankin (Stroke Patients Only)       Balance Overall balance assessment: Needs assistance Sitting-balance support: Feet supported, Bilateral upper extremity supported Sitting balance-Leahy Scale: Good     Standing balance support: Bilateral upper extremity supported, Reliant on assistive device for balance Standing balance-Leahy Scale: Fair                              Hotel manager: Impaired Factors Affecting Communication: Hearing impaired  Cognition Arousal: Alert Behavior During Therapy: WFL for tasks assessed/performed   PT - Cognitive impairments: No apparent impairments                         Following commands: Intact      Cueing    Exercises      General Comments General comments (skin integrity, edema, etc.): VSS on RA      Pertinent Vitals/Pain Pain Assessment Pain Assessment: Faces Faces Pain Scale: Hurts a little bit Pain Location: abdomen Pain Descriptors / Indicators: Sore, Discomfort Pain Intervention(s): Limited activity within patient's tolerance, Monitored during session, Repositioned    Home Living                          Prior Function  PT Goals (current goals can now be found in the care plan section) Acute Rehab PT Goals Patient Stated Goal: to feel better PT Goal Formulation: With patient Time For Goal Achievement: 04/04/24 Potential to Achieve Goals: Good Progress towards PT goals: Progressing toward goals    Frequency    Min 2X/week      PT Plan      Co-evaluation              AM-PAC PT "6 Clicks" Mobility   Outcome Measure  Help needed turning from  your back to your side while in a flat bed without using bedrails?: A Little Help needed moving from lying on your back to sitting on the side of a flat bed without using bedrails?: A Little Help needed moving to and from a bed to a chair (including a wheelchair)?: A Little Help needed standing up from a chair using your arms (e.g., wheelchair or bedside chair)?: A Little Help needed to walk in hospital room?: A Little Help needed climbing 3-5 steps with a railing? : A Little 6 Click Score: 18    End of Session   Activity Tolerance: Patient tolerated treatment well Patient left: in chair;with call bell/phone within reach;with chair alarm set;with family/visitor present Nurse Communication: Mobility status PT Visit Diagnosis: Other abnormalities of gait and mobility (R26.89);Difficulty in walking, not elsewhere classified (R26.2);Muscle weakness (generalized) (M62.81)     Time: 2130-8657 PT Time Calculation (min) (ACUTE ONLY): 19 min  Charges:    $Therapeutic Activity: 8-22 mins PT General Charges $$ ACUTE PT VISIT: 1 Visit                     Maylon Peppers, PT, DPT Physical Therapist - [facility]  [facility]    Estelle Greenleaf A Jayla Mackie 03/26/2024, 3:23 PM

## 2024-03-26 NOTE — Plan of Care (Signed)
 Patient has struggled with intake for the past 10 hours. Though only on a CL diet - between lunch and dinner trays, only attempted maybe 10% on each.  Zofran x2 and phenergan x1.  Only had Nausea and dry heavy for most of the day but finally did vomit at 1830 (@120cc . ).  Patient is tired!  Education given to family and patient explaining the healing process and that, unfortunately, it may be a longer road than they desire.  Patient remains on a CL diet and also understands the importance of ambulation and "movement" as able.

## 2024-03-26 NOTE — Progress Notes (Signed)
 OT Cancellation Note  Patient Details Name: Todd Rojas MRN: [id number] DOB: Nov 20, 1941   Cancelled Treatment:    Reason Eval/Treat Not Completed: Fatigue/lethargy limiting ability to participate. On arrival pt sleeping soundly, spouse in room reports pt having "a bad day" and requesting to re-attempt next date.   Kathie Dike, M.S. OTR/L  03/26/24, 3:46 PM  ascom [phone]

## 2024-03-27 ENCOUNTER — Inpatient Hospital Stay

## 2024-03-27 DIAGNOSIS — E43 Unspecified severe protein-calorie malnutrition: Secondary | ICD-10-CM | POA: Insufficient documentation

## 2024-03-27 DIAGNOSIS — A419 Sepsis, unspecified organism: Secondary | ICD-10-CM | POA: Diagnosis not present

## 2024-03-27 DIAGNOSIS — R6520 Severe sepsis without septic shock: Secondary | ICD-10-CM | POA: Diagnosis not present

## 2024-03-27 LAB — MAGNESIUM: Magnesium: 2.3 mg/dL (ref 1.7–2.4)

## 2024-03-27 LAB — CBC
HCT: 40.7 % (ref 39.0–52.0)
Hemoglobin: 13.2 g/dL (ref 13.0–17.0)
MCH: 32.3 pg (ref 26.0–34.0)
MCHC: 32.4 g/dL (ref 30.0–36.0)
MCV: 99.5 fL (ref 80.0–100.0)
Platelets: 243 10*3/uL (ref 150–400)
RBC: 4.09 MIL/uL — ABNORMAL LOW (ref 4.22–5.81)
RDW: 14.2 % (ref 11.5–15.5)
WBC: 10.3 10*3/uL (ref 4.0–10.5)
nRBC: 0 % (ref 0.0–0.2)

## 2024-03-27 LAB — BASIC METABOLIC PANEL WITH GFR
Anion gap: 7 (ref 5–15)
BUN: 39 mg/dL — ABNORMAL HIGH (ref 8–23)
CO2: 25 mmol/L (ref 22–32)
Calcium: 8.6 mg/dL — ABNORMAL LOW (ref 8.9–10.3)
Chloride: 106 mmol/L (ref 98–111)
Creatinine, Ser: 1.32 mg/dL — ABNORMAL HIGH (ref 0.61–1.24)
GFR, Estimated: 54 mL/min — ABNORMAL LOW (ref >60)
Glucose, Bld: 348 mg/dL — ABNORMAL HIGH (ref 70–99)
Potassium: 4.1 mmol/L (ref 3.5–5.1)
Sodium: 138 mmol/L (ref 135–145)

## 2024-03-27 LAB — GLUCOSE, CAPILLARY
Glucose-Capillary: 126 mg/dL — ABNORMAL HIGH (ref 70–99)
Glucose-Capillary: 129 mg/dL — ABNORMAL HIGH (ref 70–99)
Glucose-Capillary: 133 mg/dL — ABNORMAL HIGH (ref 70–99)
Glucose-Capillary: 141 mg/dL — ABNORMAL HIGH (ref 70–99)
Glucose-Capillary: 149 mg/dL — ABNORMAL HIGH (ref 70–99)

## 2024-03-27 LAB — PHOSPHORUS: Phosphorus: 3.9 mg/dL (ref 2.5–4.6)

## 2024-03-27 MED ORDER — TRAVASOL 10 % IV SOLN
INTRAVENOUS | Status: AC
  Filled 2024-03-27: qty 528

## 2024-03-27 MED ORDER — PHENOL 1.4 % MT LIQD
1.0000 | OROMUCOSAL | Status: DC | PRN
Start: 2024-03-27 — End: 2024-04-09
  Administered 2024-03-27 – 2024-04-04 (×5): 1 via OROMUCOSAL
  Filled 2024-03-27: qty 177

## 2024-03-27 MED ORDER — DEXTROSE-SODIUM CHLORIDE 5-0.9 % IV SOLN: INTRAVENOUS | Status: AC

## 2024-03-27 MED ORDER — ALBUMIN HUMAN 25 % IV SOLN
25.0000 g | Freq: Once | INTRAVENOUS | Status: AC
  Administered 2024-03-27: 25 g via INTRAVENOUS
  Filled 2024-03-27: qty 100

## 2024-03-27 NOTE — Plan of Care (Signed)
  Problem: Nutrition: Goal: Adequate nutrition will be maintained Outcome: Not Progressing   Problem: Elimination: Goal: Will not experience complications related to bowel motility Outcome: Not Progressing   

## 2024-03-27 NOTE — Progress Notes (Signed)
 [facility] SURGICAL PROGRESS NOTE  Hospital Day(s): 18.   Post op day(s): 7 Days Post-Op.   Interval History:  Patient seen and examined Continued to have emesis overnight Nauseous this morning Abdominal distension persistent  No longer with nausea/emesis WBC normalized; 10.3K Hgb 13.2 Still with AKI; slight improvement; sCr - 1.32; UO - 400 ccs Ileostomy with decreased output   Vital signs in last 24 hours: [min-max] current  Temp:  [97.6 F (36.4 C)-98.2 F (36.8 C)] 97.6 F (36.4 C) (04/09 0420) Pulse Rate:  [66-95] 71 (04/09 0420) Resp:  [16-20] 18 (04/09 0420) BP: (111-140)/(58-71) 121/58 (04/09 0420) SpO2:  [91 %-97 %] 94 % (04/09 0420) Weight:  [81.6 kg] 81.6 kg (04/09 0500)     Height: 5\' 9"  (175.3 cm) Weight: 81.6 kg BMI (Calculated): 26.55   Intake/Output last 2 shifts:  04/08 0701 - 04/09 0700 In: 592.4 [P.O.:90; I.V.:502.4] Out: 1200 [Urine:400; Emesis/NG output:600; Stool:200]   Physical Exam:  Constitutional: somnolent, arouses, NAD Respiratory: breathing non-labored at rest  Cardiovascular: regular rate and sinus rhythm  Gastrointestinal: soft, he does not appear overtly tender, continues to be distended/tympanic, no rebound/guarding. Ileostomy in RLQ, minimal stool in bag this AM Integumentary: Laparoscopic incisions are CDI with dermabond   Labs:     Latest Ref Rng & Units 03/27/2024    6:48 AM 03/26/2024    4:09 AM 03/25/2024    4:17 AM  CBC  WBC 4.0 - 10.5 K/uL 10.3  13.3  18.1   Hemoglobin 13.0 - 17.0 g/dL 65.7  84.6  96.2   Hematocrit 39.0 - 52.0 % 40.7  41.2  42.5   Platelets 150 - 400 K/uL 243  260  257       Latest Ref Rng & Units 03/26/2024    4:09 AM 03/25/2024    8:47 AM 03/23/2024    4:05 AM  CMP  Glucose 70 - 99 mg/dL 952  841  324   BUN 8 - 23 mg/dL 49  45  35   Creatinine 0.61 - 1.24 mg/dL 4.01  0.27  2.53   Sodium 135 - 145 mmol/L 137  135  134   Potassium 3.5 - 5.1 mmol/L 5.0  5.3  4.7   Chloride 98 - 111 mmol/L  102  100  103   CO2 22 - 32 mmol/L 27  27  25    Calcium 8.9 - 10.3 mg/dL 8.9  8.9  8.3   Total Protein 6.5 - 8.1 g/dL 6.6  6.7    Total Bilirubin 0.0 - 1.2 mg/dL 1.1  0.8    Alkaline Phos 38 - 126 U/L 63  57    AST 15 - 41 U/L 22  25    ALT 0 - 44 U/L 25  26       Imaging studies:  No new imaging studies   Assessment/Plan:  83 y.o. 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    - Unfortunately, he continued to have emesis overnight and KUB this AM still with gastric and small bowel distension, consistent with ileus.  - I do think we need to replace NGT this morning, LIS, monitor output   - Will restart TPN; monitor electrolytes  - Monitor renal function; improving  - No need for emergent surgical intervention   - Will get morning KUB   -  Monitor abdominal examination; on-going ileostomy function   - Pain control prn; minimize narcotics as feasible - Antiemetics prn   - Encouraged frequent mobilization as feasible - therapies following   - Further management per primary service; we will follow   - Discharge Planning: Not ready for discharge, ileus  All of the above findings and recommendations were discussed with the patient, patient's family (wife at bedside), and the medical team, and all of their questions were answered to their expressed satisfaction.  -- Lynden Oxford, PA-C [facility] 03/27/2024, 7:23 AM M-F: 7am - 4pm

## 2024-03-27 NOTE — Progress Notes (Signed)
 PT Cancellation Note  Patient Details Name: Todd Rojas MRN: [id number] DOB: July 25, 1941   Cancelled Treatment:     PT attempt. Pt was supine in bed  endorsing feeling very rough." I had a long rough night." RN staff about to place NGT. Will hold PT today and return tomorrow.   Rushie Chestnut 03/27/2024, 10:47 AM

## 2024-03-27 NOTE — Progress Notes (Signed)
 PHARMACY - TOTAL PARENTERAL NUTRITION CONSULT NOTE   Indication: Prolonged illeus  Patient Measurements: Height: 5\' 9"  (175.3 cm) Weight: 81.6 kg (179 lb 14.3 oz) IBW/kg (Calculated) : 70.7 TPN AdjBW (KG): 77.3 Body mass index is 26.57 kg/m.  Assessment: 83 y.o. 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   Glucose / Insulin:  BG 117-348 1u SSI last 24h BG has been well controlled other than 348, will watch closely Electrolytes: Mild hyponatremia Renal: SCr ~ 1. 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 Cl:Ac 1:1 Add standard MVI and trace elements to TPN Initiate Sensitive q6h SSI and adjust as needed  Monitor TPN labs on Mon/Thurs, and daily until stable  Barrie Folk, PharmD Clinical Pharmacist 03/27/2024

## 2024-03-27 NOTE — Progress Notes (Signed)
 Nutrition Follow Up Note   DOCUMENTATION CODES:   Severe malnutrition in context of acute illness/injury  INTERVENTION:   TPN per pharmacy   Recommend thiamine 100mg  IV daily x 7 days   Pt at high refeed risk; recommend monitor potassium, magnesium and phosphorus labs daily until stable  Daily weights    NUTRITION DIAGNOSIS:   Severe Malnutrition related to acute illness as evidenced by mild fat depletion, moderate fat depletion, mild muscle depletion, moderate muscle depletion. -new diagnosis   GOAL:   Patient will meet greater than or equal to 90% of their needs -not met   MONITOR:   Diet advancement, Labs, Weight trends, Skin, I & O's, Other (Comment) (TPN)  ASSESSMENT:   83 y/o male with h/o CAD, COPD, HLD, gout, DDD, GERD, CHF, thyroid nodule, kidney stones, B12 deficiency and CKD III who is admitted with PNA, sepsis and Ogilvie syndrome s/p colonic decompression 3/30 & s/p robotic assisted laparoscopic loop ileostomy with decompression of cecum 4/2 complicated by post op ileus.  Met with pt and pt's wife in room today. Pt noted to have persistent ileus. Pt reports that he is feeling ok. Pt's main complaint is a sore throat from having NGT replaced today. NGT to LIS with ~626ml output. Pt reports nausea and vomiting overnight. Pt is burping today. Abdomen remains distended. Pt made NPO again. Plan is for TPN to restart tonight. Pt is at high refeed risk. Per chart, pt is down ~5lbs(3%) since admission. Pt -6.5L on his I & Os. Pt with new muscle and fat depletions today and meets criteria for severe acute malnutrition.    Medications reviewed and include: allopurinol, aspirin, lovenox, insulin, MVI, protonix, simethicone, 5% dextrose @75ml /hr  Labs reviewed: K 4.1 wnl, BUN 39(H), creat 1.32(H), P 3.9 wnl, Mg 2.3 wnl Cbgs- 129, 149, 133 x 24 hrs   UOP-   Nutrition Focused Physical Exam:  Flowsheet Row Most Recent Value  Orbital Region No depletion  Upper Arm  Region Moderate depletion  Thoracic and Lumbar Region No depletion  Buccal Region Mild depletion  Temple Region No depletion  Clavicle Bone Region Mild depletion  Clavicle and Acromion Bone Region Mild depletion  Scapular Bone Region No depletion  Dorsal Hand Moderate depletion  Patellar Region Mild depletion  Anterior Thigh Region Mild depletion  Posterior Calf Region Mild depletion  Edema (RD Assessment) Mild  Hair Reviewed  Eyes Reviewed  Mouth Reviewed  Skin Reviewed  Nails Reviewed   Diet Order:   Diet Order             Diet NPO time specified Except for: Sips with Meds, Ice Chips  Diet effective now                  EDUCATION NEEDS:   Education needs have been addressed  Skin:  Skin Assessment: Reviewed RN Assessment (incision abdomen)  Last BM:  4/9- via ostomy  Height:   Ht Readings from Last 1 Encounters:  03/15/24 5\' 9"  (1.753 m)    Weight:   Wt Readings from Last 1 Encounters:  03/27/24 81.6 kg    Ideal Body Weight:  72.7 kg  BMI:  Body mass index is 26.57 kg/m.  Estimated Nutritional Needs:   Kcal:  2000-2300kcal/day  Protein:  100-115g/day  Fluid:  1.9-2.2L/day  [name], RD, LDN If unable to be reached, please send secure chat to "RD inpatient" available from 8:00a-4:00p daily

## 2024-03-27 NOTE — TOC Progression Note (Signed)
 Transition of Care [facility]) - Progression Note    Patient Details  Name: Todd Rojas MRN: [id number] Date of Birth: Oct 22, 1941  [facility] CM/SW Contact  Margarito Liner, LCSW Phone Number: 03/27/2024, 12:47 PM  Clinical Narrative:   CSW updated [facility] admissions assistant.  Expected Discharge Plan: Skilled Nursing Facility Barriers to Discharge: Continued Medical Work up  Expected Discharge Plan and Services     Post Acute Care Choice:  (TBD) Living arrangements for the past 2 months: Single Family Home                                       Social Determinants of Health (SDOH) Interventions SDOH Screenings   Food Insecurity: No Food Insecurity (03/10/2024)  Housing: Low Risk  (03/10/2024)  Transportation Needs: No Transportation Needs (03/10/2024)  Utilities: Not At Risk (03/10/2024)  Depression (PHQ2-9): Low Risk  (07/12/2021)  Financial Resource Strain: Low Risk  (10/23/2023)   Received from [facility]  Social Connections: Socially Integrated (03/10/2024)  Tobacco Use: Medium Risk (03/25/2024)    Readmission Risk Interventions     No data to display

## 2024-03-27 NOTE — Progress Notes (Signed)
 Progress Note    Todd Rojas  ZDG:[id number] DOB: 09/13/1941  DOA: 03/09/2024 PCP: Todd Reichmann, MD      Brief Narrative:    Medical records reviewed and are as summarized below:  Todd Rojas is a 83 y.o. male  with history of CKD stage IIIa, chronic gout, hypertension, hyperlipidemia, interstitial pulmonary disease, bronchiectasis, COPD, who presents emergency department for chief concerns of fever, chills, shortness of breath for 2 weeks.   Vitals in the ED showed temperature of 98.4, respiration rate 25, heart rate 94, blood pressure initially 96/69 and improved to 107/66, SpO2 initially desatted to 88% on room air   Status post exploratory laparotomy with robotic assisted laparoscopic loop ileostomy and decompression of cecum on 4/2.  Tolerated procedure well.  No ischemic gut noted   4/7: Worsening nausea.  Inability to tolerate p.o.  Repeat KUB demonstrating ileus.  General surgery reengaged.  Ordering CT abdomen.   4/8: Conservative management per surgery.  No evidence of obstruction.  CT reassuring.  Diet scaled back to n.p.o. for now.  Clear liquids later today if stable.      Assessment/Plan:   Principal Problem:   Severe sepsis with acute organ dysfunction (HCC) Active Problems:   Fever   Acute hypoxic respiratory failure (HCC)   CAD (coronary artery disease)   Elevated troponin   Gaseous abdominal distention   COPD (chronic obstructive pulmonary disease) (HCC)   Elevated serum creatinine   Hypertension   Hyperlipidemia, unspecified   Primary osteoarthritis of both knees   Thyroid nodule greater than or equal to 1.5 cm in diameter incidentally noted on imaging study   Dyslipidemia   Personal history of gout   Ogilvie syndrome   Nutrition Problem: Inadequate oral intake Etiology: altered GI function  Signs/Symptoms: other (comment) (pt on NPO/clear liquid diet since admission)   Body mass index is 26.57 kg/m.   Gaseous  abdominal distention 2/2 Colonic ileus  Ogilvie's syndrome Patient developed abdominal distention and crampy abdominal pain after presentation. Starting 3/25, Imaging with right colonic gaseous distention without any obvious obstruction.  Colonic distention has been worsening. --GenSurg and GI consulted, dx with Ogilvie's syndrome --neostigmine 2 mg push morning of 3/29 with [name] and [name] present with passing of gas and BM afterwards, however, colonic distention unchanged, so pt underwent endoscopic decompression on 3/30.  Unfortunately colonic gas re-accumulated. --started TPN on 3/31 Robotic assisted laparoscopic loop ileostomy with decompression of cecum on 03/20/2024 --Was clinically improving then had deterioration on 4/7.  He was made n.p.o. and was subsequently transitioned to clear liquid diet. He had abdominal pain and vomiting on 03/27/2024 so NGT was replaced. Started on TPN for nutrition    Bradycardia, recurrent Initially noted secondary to neostigmine as administration.  Heart rate was down to 20s.  Patient received atropine.  Heart rate improved.  On 4/3 noted to be bradycardic, asymptomatic with heart rate in the 40s to 50s.  Beta-blockade was held. Use IV metoprolol as needed    Severe sepsis with acute organ dysfunction (HCC) 2/2 PNA Elevated lactic acid of 2.6, increased respiration rate, with fever of 102 F at home, organ involvement are cardiac and pulmonary with hypoxic respiratory failure at 88% on room air, elevated high sensitive troponin of 98 and elevated BNP --Imaging concerning for right lower lobe pneumonia, although not much upper respiratory symptoms.  Procalcitonin at 0.352. Respiratory panels were negative. UA does not consistent with UTI Patient received broad-spectrum antibiotics Completed  antibiotic course    Acute hypoxic respiratory failure (HCC) Resolved.  He is tolerating room air    Elevated troponin 2/2 demand ischemia First high  sensitivity troponin was elevated at 98 >>101>175>226,  Suspect secondary to demand ischemia in setting of severe sepsis with acute hypoxic respiratory failure Echocardiogram with EF of 40% and regional wall motion abnormalities, discussed with cardiology and they think these abnormalities are chronic. Patient was also experiencing intermittent chest pain over the past couple of months which seems more exertional --cardio consulted, no plan for cardiac workup    CAD (coronary artery disease) With history of anterior STEMI in 2006, transferred to [facility] status post cardiac catheterization with PCI to mid LAD using Cypher stent; currently no longer on Plavix Continue aspirin and statin    COPD (chronic obstructive pulmonary disease) (HCC) Bronchodilators as needed     Elevated serum creatinine Does not meet acute kidney injury, suspect prerenal in setting of nausea vomiting Serum creatinine baseline is 1.35-1.36/eGFR 52-55, consistent with CKD stage III A Monitor BMP.  Minimize use of nephrotoxins     Thyroid nodule greater than or equal to 1.5 cm in diameter incidentally noted on imaging study Incidental finding of a 2.7 cm thyroid nodule. -Thyroid ultrasound with solitary nodule.  Outpatient biopsy recommended --TSH wnl    Rash Developed morning of 3/28, all over chest, back and bilateral thighs.  Appeared to be allergic rxn.  Rash improved with prednisone.  Completed 5 days of prednisone 40 mg daily.    Comorbidities include hypertension, hyperlipidemia, osteoarthritis of both knees s/p bilateral knee arthroplasty with mild right knee effusion, gout    Diet Order             Diet NPO time specified Except for: Sips with Meds, Ice Chips  Diet effective now                            Consultants: Arts development officer Cardiologist  Procedures: Flexible sigmoidoscopy on 03/17/2024 Robotic assisted laparoscopic loop ileostomy with decompression  of cecum on 03/20/2024    Medications:    acetaminophen  1,000 mg Oral Q6H   allopurinol  300 mg Oral Daily   amLODipine  5 mg Oral Daily   aspirin EC  81 mg Oral Daily   Chlorhexidine Gluconate Cloth  6 each Topical Daily   diclofenac Sodium  2 g Topical TID   enoxaparin (LOVENOX) injection  40 mg Subcutaneous Q24H   feeding supplement  1 Container Oral TID BM   insulin aspart  0-9 Units Subcutaneous Q6H   metoprolol tartrate  2.5 mg Intravenous Q8H   mometasone-formoterol  2 puff Inhalation BID   multivitamin with minerals  1 tablet Oral Daily   pantoprazole (PROTONIX) IV  40 mg Intravenous Daily   simethicone  80 mg Oral QID   sodium chloride flush  10-40 mL Intracatheter Q12H   Continuous Infusions:  albumin human     dextrose 5 % and 0.9 % NaCl 75 mL/hr at 03/27/24 1040   promethazine (PHENERGAN) injection (IM or IVPB) 12.5 mg (03/27/24 0800)   TPN ADULT (ION)       Anti-infectives (From admission, onward)    Start     Dose/Rate Route Frequency Ordered Stop   03/20/24 1315  cefoTEtan (CEFOTAN) 2 g in sodium chloride 0.9 % 100 mL IVPB  Status:  Discontinued        2 g 200 mL/hr over 30 Minutes  Intravenous  Once 03/20/24 1228 03/24/24 0924   03/20/24 0830  cefoTEtan (CEFOTAN) 2 g in sodium chloride 0.9 % 100 mL IVPB        2 g 200 mL/hr over 30 Minutes Intravenous  Once 03/19/24 1739 03/20/24 0825   03/13/24 1200  levofloxacin (LEVAQUIN) tablet 750 mg        750 mg Oral Daily 03/13/24 1148 03/15/24 1413   03/10/24 1600  vancomycin (VANCOREADY) IVPB 1250 mg/250 mL  Status:  Discontinued        1,250 mg 166.7 mL/hr over 90 Minutes Intravenous Every 24 hours 03/10/24 1056 03/10/24 1250   03/10/24 0945  azithromycin (ZITHROMAX) 500 mg in sodium chloride 0.9 % 250 mL IVPB        500 mg 250 mL/hr over 60 Minutes Intravenous Every 24 hours 03/10/24 0934 03/12/24 1024   03/10/24 0200  metroNIDAZOLE (FLAGYL) IVPB 500 mg  Status:  Discontinued        500 mg 100 mL/hr over 60  Minutes Intravenous Every 12 hours 03/09/24 1515 03/10/24 0933   03/10/24 0000  ceFEPIme (MAXIPIME) 2 g in sodium chloride 0.9 % 100 mL IVPB  Status:  Discontinued        2 g 200 mL/hr over 30 Minutes Intravenous Every 12 hours 03/09/24 1541 03/13/24 1148   03/09/24 1600  vancomycin (VANCOCIN) IVPB 1000 mg/200 mL premix        1,000 mg 200 mL/hr over 60 Minutes Intravenous  Once 03/09/24 1537 03/09/24 1834   03/09/24 1537  vancomycin variable dose per unstable renal function (pharmacist dosing)  Status:  Discontinued         Does not apply See admin instructions 03/09/24 1537 03/10/24 1056   03/09/24 1515  metroNIDAZOLE (FLAGYL) IVPB 500 mg  Status:  Discontinued        500 mg 100 mL/hr over 60 Minutes Intravenous Every 12 hours 03/09/24 1504 03/09/24 1515   03/09/24 1200  ceFEPIme (MAXIPIME) 2 g in sodium chloride 0.9 % 100 mL IVPB        2 g 200 mL/hr over 30 Minutes Intravenous  Once 03/09/24 1152 03/09/24 1304   03/09/24 1200  metroNIDAZOLE (FLAGYL) IVPB 500 mg        500 mg 100 mL/hr over 60 Minutes Intravenous  Once 03/09/24 1152 03/09/24 1505   03/09/24 1200  vancomycin (VANCOCIN) IVPB 1000 mg/200 mL premix        1,000 mg 200 mL/hr over 60 Minutes Intravenous  Once 03/09/24 1152 03/09/24 1523              Family Communication/Anticipated D/C date and plan/Code Status   DVT prophylaxis: enoxaparin (LOVENOX) injection 40 mg Start: 03/26/24 2200 Place TED hose Start: 03/09/24 1502     Code Status: Full Code  Family Communication: None Disposition Plan: Plan to discharge to SNF   Status is: Inpatient Remains inpatient appropriate because: Persistent ileus       Subjective:   Interval events noted.  He complains of general weakness, abdominal pain, nausea and vomiting.  Objective:    Vitals:   03/27/24 0208 03/27/24 0420 03/27/24 0500 03/27/24 0835  BP: (!) 140/62 (!) 121/58  (!) 120/54  Pulse: 95 71  67  Resp: 20 18    Temp:  97.6 F (36.4 C)  97.9  F (36.6 C)  TempSrc:  Oral  Axillary  SpO2:  94%  93%  Weight:   81.6 kg   Height:  No data found.   Intake/Output Summary (Last 24 hours) at 03/27/2024 1109 Last data filed at 03/27/2024 0420 Gross per 24 hour  Intake 592.36 ml  Output 1200 ml  Net -607.64 ml   Filed Weights   03/25/24 0500 03/26/24 0037 03/27/24 0500  Weight: 84.4 kg 82.2 kg 81.6 kg    Exam:  GEN: NAD SKIN: Warm and dry EYES: No pallor or icterus ENT: MMM CV: RRR PULM: CTA B ABD: soft, distended, NT, +BS, + ileostomy with some yellowish feces CNS: AAO x 3, non focal EXT: No edema or tenderness        Data Reviewed:   I have personally reviewed following labs and imaging studies:  Labs: Labs show the following:   Basic Metabolic Panel: Recent Labs  Lab 03/21/24 0541 03/21/24 1509 03/22/24 0605 03/23/24 0405 03/25/24 0847 03/26/24 0409 03/27/24 0648  NA 135  --  133* 134* 135 137 138  K 5.2*   < > 4.8 4.7 5.3* 5.0 4.1  CL 102  --  101 103 100 102 106  CO2 26  --  27 25 27 27 25   GLUCOSE 156*  --  106* 125* 127* 107* 348*  BUN 46*  --  40* 35* 45* 49* 39*  CREATININE 1.07  --  1.08 0.96 1.42* 1.46* 1.32*  CALCIUM 8.0*  --  7.9* 8.3* 8.9 8.9 8.6*  MG 2.5*  --   --   --  2.6* 2.7* 2.3  PHOS 4.8*  --   --   --  5.1* 5.4* 3.9   < > = values in this interval not displayed.   GFR Estimated Creatinine Clearance: 42.4 mL/min (A) (by C-G formula based on SCr of 1.32 mg/dL (H)). Liver Function Tests: Recent Labs  Lab 03/21/24 0541 03/25/24 0847 03/26/24 0409  AST 13* 25 22  ALT 16 26 25   ALKPHOS 28* 57 63  BILITOT 0.4 0.8 1.1  PROT 5.6* 6.7 6.6  ALBUMIN 2.2* 2.7* 2.7*   No results for input(s): "LIPASE", "AMYLASE" in the last 168 hours. No results for input(s): "AMMONIA" in the last 168 hours. Coagulation profile No results for input(s): "INR", "PROTIME" in the last 168 hours.  CBC: Recent Labs  Lab 03/22/24 0605 03/25/24 0417 03/26/24 0409 03/27/24 0648  WBC 12.6*  18.1* 13.3* 10.3  HGB 12.1* 14.4 13.6 13.2  HCT 36.2* 42.5 41.2 40.7  MCV 96.5 96.4 98.6 99.5  PLT 236 257 260 243   Cardiac Enzymes: No results for input(s): "CKTOTAL", "CKMB", "CKMBINDEX", "TROPONINI" in the last 168 hours. BNP (last 3 results) No results for input(s): "PROBNP" in the last 8760 hours. CBG: Recent Labs  Lab 03/26/24 1155 03/26/24 1804 03/26/24 2322 03/27/24 0006 03/27/24 0551  GLUCAP 117* 120* 128* 133* 149*   D-Dimer: No results for input(s): "DDIMER" in the last 72 hours. Hgb A1c: No results for input(s): "HGBA1C" in the last 72 hours. Lipid Profile: No results for input(s): "CHOL", "HDL", "LDLCALC", "TRIG", "CHOLHDL", "LDLDIRECT" in the last 72 hours. Thyroid function studies: No results for input(s): "TSH", "T4TOTAL", "T3FREE", "THYROIDAB" in the last 72 hours.  Invalid input(s): "FREET3" Anemia work up: No results for input(s): "VITAMINB12", "FOLATE", "FERRITIN", "TIBC", "IRON", "RETICCTPCT" in the last 72 hours. Sepsis Labs: Recent Labs  Lab 03/22/24 0605 03/25/24 0417 03/26/24 0409 03/27/24 0648  WBC 12.6* 18.1* 13.3* 10.3    Microbiology No results found for this or any previous visit (from the past 240 hours).  Procedures and diagnostic studies:  No  results found.             LOS: 18 days   Todd Rojas  [employer]ered loss adjuster on [email or web address]. If 7PM-7AM, please contact night-coverage at [email or web address]     03/27/2024, 11:09 AM

## 2024-03-28 ENCOUNTER — Inpatient Hospital Stay

## 2024-03-28 DIAGNOSIS — R6520 Severe sepsis without septic shock: Secondary | ICD-10-CM | POA: Diagnosis not present

## 2024-03-28 DIAGNOSIS — A419 Sepsis, unspecified organism: Secondary | ICD-10-CM | POA: Diagnosis not present

## 2024-03-28 LAB — COMPREHENSIVE METABOLIC PANEL WITH GFR
ALT: 15 U/L (ref 0–44)
AST: 14 U/L — ABNORMAL LOW (ref 15–41)
Albumin: 3.3 g/dL — ABNORMAL LOW (ref 3.5–5.0)
Alkaline Phosphatase: 47 U/L (ref 38–126)
Anion gap: 9 (ref 5–15)
BUN: 36 mg/dL — ABNORMAL HIGH (ref 8–23)
CO2: 24 mmol/L (ref 22–32)
Calcium: 8.8 mg/dL — ABNORMAL LOW (ref 8.9–10.3)
Chloride: 109 mmol/L (ref 98–111)
Creatinine, Ser: 1.01 mg/dL (ref 0.61–1.24)
GFR, Estimated: >60 mL/min (ref >60)
Glucose, Bld: 135 mg/dL — ABNORMAL HIGH (ref 70–99)
Potassium: 4.1 mmol/L (ref 3.5–5.1)
Sodium: 142 mmol/L (ref 135–145)
Total Bilirubin: 0.8 mg/dL (ref 0.0–1.2)
Total Protein: 6.5 g/dL (ref 6.5–8.1)

## 2024-03-28 LAB — PHOSPHORUS: Phosphorus: 3.3 mg/dL (ref 2.5–4.6)

## 2024-03-28 LAB — GLUCOSE, CAPILLARY
Glucose-Capillary: 135 mg/dL — ABNORMAL HIGH (ref 70–99)
Glucose-Capillary: 152 mg/dL — ABNORMAL HIGH (ref 70–99)
Glucose-Capillary: 139 mg/dL — ABNORMAL HIGH (ref 70–99)
Glucose-Capillary: 169 mg/dL — ABNORMAL HIGH (ref 70–99)

## 2024-03-28 LAB — TRIGLYCERIDES: Triglycerides: 96 mg/dL (ref <150)

## 2024-03-28 LAB — HEMOGLOBIN A1C
Hgb A1c MFr Bld: 6.5 % — ABNORMAL HIGH (ref 4.8–5.6)
Mean Plasma Glucose: 140 mg/dL

## 2024-03-28 LAB — MAGNESIUM: Magnesium: 2.4 mg/dL (ref 1.7–2.4)

## 2024-03-28 MED ORDER — DEXTROSE 70 % IV SOLN
INTRAVENOUS | Status: AC
  Filled 2024-03-28: qty 1056

## 2024-03-28 MED ORDER — THIAMINE HCL 100 MG/ML IJ SOLN
100.0000 mg | Freq: Every day | INTRAMUSCULAR | Status: AC
  Administered 2024-03-28 – 2024-04-03 (×7): 100 mg via INTRAVENOUS
  Filled 2024-03-28 (×7): qty 2

## 2024-03-28 NOTE — Consult Note (Signed)
 WOC by to check patient status and pouch, intact, wife not in the room. Patient with NG back; he's is very sleepy not really able to participate today. Notified staff to let wife know it was be first of next week when WOC will check back on patient.   Jaeliana Lococo [facility], CNS, [employer] [phone]

## 2024-03-28 NOTE — Progress Notes (Signed)
 [facility] SURGICAL PROGRESS NOTE  Hospital Day(s): 19.   Post op day(s): 8 Days Post-Op.   Interval History:  Patient seen and examined No acute issues overnight Patient reports he feels a little better; wife agrees No fever, chills  Labs are pending this AM UO - 1300 ccs NGT with 900 ccs out Ileostomy with 600 ccs out recorded  Vital signs in last 24 hours: [min-max] current  Temp:  [97.4 F (36.3 C)-98 F (36.7 C)] 97.4 F (36.3 C) (04/10 0314) Pulse Rate:  [50-72] 50 (04/10 0332) Resp:  [15-16] 15 (04/10 0332) BP: (103-139)/(54-72) 128/70 (04/10 0332) SpO2:  [93 %-99 %] 97 % (04/10 0314) Weight:  [81.4 kg] 81.4 kg (04/10 0323)     Height: 5\' 9"  (175.3 cm) Weight: 81.4 kg BMI (Calculated): 26.48   Intake/Output last 2 shifts:  04/09 0701 - 04/10 0700 In: 636.7 [P.O.:40; I.V.:333.3; NG/GT:120; IV Piggyback:143.4] Out: 2800 [Urine:1300; Emesis/NG output:900; Stool:600]   Physical Exam:  Constitutional: alert, awake, NAD HEENT: NGT in place  Respiratory: breathing non-labored at rest  Cardiovascular: regular rate and sinus rhythm  Gastrointestinal: soft, he does not appear overtly tender, continues to be distended/tympanic but improved, no rebound/guarding. Ileostomy in RLQ, stool in bag this AM Integumentary: Laparoscopic incisions are CDI with dermabond   Labs:     Latest Ref Rng & Units 03/27/2024    6:48 AM 03/26/2024    4:09 AM 03/25/2024    4:17 AM  CBC  WBC 4.0 - 10.5 K/uL 10.3  13.3  18.1   Hemoglobin 13.0 - 17.0 g/dL 16.1  09.6  04.5   Hematocrit 39.0 - 52.0 % 40.7  41.2  42.5   Platelets 150 - 400 K/uL 243  260  257       Latest Ref Rng & Units 03/27/2024    6:48 AM 03/26/2024    4:09 AM 03/25/2024    8:47 AM  CMP  Glucose 70 - 99 mg/dL 409  811  914   BUN 8 - 23 mg/dL 39  49  45   Creatinine 0.61 - 1.24 mg/dL 7.82  9.56  2.13   Sodium 135 - 145 mmol/L 138  137  135   Potassium 3.5 - 5.1 mmol/L 4.1  5.0  5.3   Chloride 98 - 111 mmol/L  106  102  100   CO2 22 - 32 mmol/L 25  27  27    Calcium 8.9 - 10.3 mg/dL 8.6  8.9  8.9   Total Protein 6.5 - 8.1 g/dL  6.6  6.7   Total Bilirubin 0.0 - 1.2 mg/dL  1.1  0.8   Alkaline Phos 38 - 126 U/L  63  57   AST 15 - 41 U/L  22  25   ALT 0 - 44 U/L  25  26      Imaging studies:   KUB (03/28/2024) personally reviewed, NGT coiled in stomach which is now decompressed, small bowel dilation remains but improving, no free air, and radiologist report pending...    Assessment/Plan:  83 y.o. 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    - Continue NGT decompression; monitor and record output - backed this out a few cm given coiling in stomach.   - Continue TPN; monitor electrolytes - advance to goal   - Monitor renal function;  improving  - No need for emergent surgical intervention   - Monitor abdominal examination; on-going ileostomy function   - Pain control prn; minimize narcotics as feasible - Antiemetics prn   - Encouraged frequent mobilization as feasible - therapies following   - Further management per primary service; we will follow   - Discharge Planning: Not ready for discharge, ileus - no change today (04/10)  All of the above findings and recommendations were discussed with the patient, patient's family (wife at bedside), and the medical team, and all of their questions were answered to their expressed satisfaction.  -- Lynden Oxford, PA-C [facility] 03/28/2024, 7:16 AM M-F: 7am - 4pm

## 2024-03-28 NOTE — Progress Notes (Signed)
Order received from Dr Ayiku to discontinue telemetry 

## 2024-03-28 NOTE — Care Management Important Message (Signed)
 Important Message  Patient Details  Name: Todd Rojas MRN: [id number] Date of Birth: 10-14-1941   Important Message Given:  Yes - Medicare IM     Marcell Anger 03/28/2024, 12:34 PM

## 2024-03-28 NOTE — Progress Notes (Addendum)
 Progress Note    Todd Rojas  RUE:[id number] DOB: 21-Jul-1941  DOA: 03/09/2024 PCP: Barbette Reichmann, MD      Brief Narrative:    Medical records reviewed and are as summarized below:  Todd Rojas is a 83 y.o. male  with history of CKD stage IIIa, chronic gout, hypertension, hyperlipidemia, interstitial pulmonary disease, bronchiectasis, COPD, who presents emergency department for chief concerns of fever, chills, shortness of breath for 2 weeks.   Vitals in the ED showed temperature of 98.4, respiration rate 25, heart rate 94, blood pressure initially 96/69 and improved to 107/66, SpO2 initially desatted to 88% on room air   Status post exploratory laparotomy with robotic assisted laparoscopic loop ileostomy and decompression of cecum on 4/2.  Tolerated procedure well.  No ischemic gut noted   4/7: Worsening nausea.  Inability to tolerate p.o.  Repeat KUB demonstrating ileus.  General surgery reengaged.  Ordering CT abdomen.   4/8: Conservative management per surgery.  No evidence of obstruction.  CT reassuring.  Diet scaled back to n.p.o. for now.  Clear liquids later today if stable.      Assessment/Plan:   Principal Problem:   Severe sepsis with acute organ dysfunction (HCC) Active Problems:   Fever   Acute hypoxic respiratory failure (HCC)   CAD (coronary artery disease)   Elevated troponin   Gaseous abdominal distention   COPD (chronic obstructive pulmonary disease) (HCC)   Elevated serum creatinine   Hypertension   Hyperlipidemia, unspecified   Primary osteoarthritis of both knees   Thyroid nodule greater than or equal to 1.5 cm in diameter incidentally noted on imaging study   Dyslipidemia   Personal history of gout   Ogilvie syndrome   Protein-calorie malnutrition, severe   Nutrition Problem: Severe Malnutrition Etiology: acute illness  Signs/Symptoms: mild fat depletion, moderate fat depletion, mild muscle depletion, moderate  muscle depletion   Body mass index is 26.49 kg/m.   Gaseous abdominal distention 2/2 Colonic ileus  Ogilvie's syndrome Patient developed abdominal distention and crampy abdominal pain after presentation. Starting 3/25, Imaging with right colonic gaseous distention without any obvious obstruction.  Colonic distention has been worsening. --GenSurg and GI consulted, dx with Ogilvie's syndrome --neostigmine 2 mg push morning of 3/29 with [name] and [name] present with passing of gas and BM afterwards, however, colonic distention unchanged, so pt underwent endoscopic decompression on 3/30.  Unfortunately colonic gas re-accumulated. --started TPN on 3/31 Robotic assisted laparoscopic loop ileostomy with decompression of cecum on 03/20/2024 --Was clinically improving then had deterioration on 4/7.  He was made n.p.o. and was subsequently transitioned to clear liquid diet. He had abdominal pain and vomiting on 03/27/2024 so NGT was replaced. Started on TPN for nutrition on 03/27/2024 Continue TPN and NG tube drainage.  Follow-up with general surgeon    Bradycardia, recurrent Initially noted secondary to neostigmine as administration.  Heart rate was down to 20s.  Patient received atropine.  Heart rate improved.  On 4/3 noted to be bradycardic, asymptomatic with heart rate in the 40s to 50s.  Beta-blockade was held. Use IV metoprolol as needed    Severe sepsis with acute organ dysfunction (HCC) 2/2 PNA Elevated lactic acid of 2.6, increased respiration rate, with fever of 102 F at home, organ involvement are cardiac and pulmonary with hypoxic respiratory failure at 88% on room air, elevated high sensitive troponin of 98 and elevated BNP --Imaging concerning for right lower lobe pneumonia, although not much upper respiratory symptoms.  Procalcitonin at 0.352. Respiratory panels were negative. UA does not consistent with UTI Patient received broad-spectrum antibiotics Completed antibiotic  course    Acute hypoxic respiratory failure (HCC) Resolved.  He is tolerating room air    Elevated troponin 2/2 demand ischemia First high sensitivity troponin was elevated at 98 >>101>175>226,  Suspect secondary to demand ischemia in setting of severe sepsis with acute hypoxic respiratory failure Echocardiogram with EF of 40% and regional wall motion abnormalities, discussed with cardiology and they think these abnormalities are chronic. Patient was also experiencing intermittent chest pain over the past couple of months which seems more exertional --cardio consulted, no plan for cardiac workup    CAD (coronary artery disease) With history of anterior STEMI in 2006, transferred to [facility] status post cardiac catheterization with PCI to mid LAD using Cypher stent; currently no longer on Plavix Continue aspirin and statin    COPD (chronic obstructive pulmonary disease) (HCC) Bronchodilators as needed     Elevated serum creatinine Creatinine is stable.  Does not meet acute kidney injury criteria, suspect prerenal in setting of nausea vomiting Serum creatinine baseline is 1.35-1.36/eGFR 52-55, consistent with CKD stage III A Monitor BMP.  Minimize use of nephrotoxins     Thyroid nodule greater than or equal to 1.5 cm in diameter incidentally noted on imaging study Incidental finding of a 2.7 cm thyroid nodule. -Thyroid ultrasound with solitary nodule.  Outpatient biopsy recommended --TSH wnl    Rash Developed morning of 3/28, all over chest, back and bilateral thighs.  Appeared to be allergic rxn.  Rash improved with prednisone.  Completed 5 days of prednisone 40 mg daily.   Hyperglycemia:  NovoLog as needed for hyperglycemia.  Check hemoglobin A1c    Comorbidities include hypertension, hyperlipidemia, osteoarthritis of both knees s/p bilateral knee arthroplasty with mild right knee effusion, gout    Diet Order             Diet NPO time specified Except for: Sips with  Meds, Ice Chips  Diet effective now                            Consultants: Arts development officer Cardiologist  Procedures: Flexible sigmoidoscopy on 03/17/2024 Robotic assisted laparoscopic loop ileostomy with decompression of cecum on 03/20/2024    Medications:    acetaminophen  1,000 mg Oral Q6H   allopurinol  300 mg Oral Daily   amLODipine  5 mg Oral Daily   aspirin EC  81 mg Oral Daily   Chlorhexidine Gluconate Cloth  6 each Topical Daily   diclofenac Sodium  2 g Topical TID   enoxaparin (LOVENOX) injection  40 mg Subcutaneous Q24H   feeding supplement  1 Container Oral TID BM   insulin aspart  0-9 Units Subcutaneous Q6H   metoprolol tartrate  2.5 mg Intravenous Q8H   mometasone-formoterol  2 puff Inhalation BID   multivitamin with minerals  1 tablet Oral Daily   pantoprazole (PROTONIX) IV  40 mg Intravenous Daily   simethicone  80 mg Oral QID   sodium chloride flush  10-40 mL Intracatheter Q12H   thiamine (VITAMIN B1) injection  100 mg Intravenous Daily   Continuous Infusions:  promethazine (PHENERGAN) injection (IM or IVPB) 12.5 mg (03/28/24 0605)   TPN ADULT (ION) 40 mL/hr at 03/28/24 0745   TPN ADULT (ION)       Anti-infectives (From admission, onward)    Start     Dose/Rate Route Frequency  Ordered Stop   03/20/24 1315  cefoTEtan (CEFOTAN) 2 g in sodium chloride 0.9 % 100 mL IVPB  Status:  Discontinued        2 g 200 mL/hr over 30 Minutes Intravenous  Once 03/20/24 1228 03/24/24 0924   03/20/24 0830  cefoTEtan (CEFOTAN) 2 g in sodium chloride 0.9 % 100 mL IVPB        2 g 200 mL/hr over 30 Minutes Intravenous  Once 03/19/24 1739 03/20/24 0825   03/13/24 1200  levofloxacin (LEVAQUIN) tablet 750 mg        750 mg Oral Daily 03/13/24 1148 03/15/24 1413   03/10/24 1600  vancomycin (VANCOREADY) IVPB 1250 mg/250 mL  Status:  Discontinued        1,250 mg 166.7 mL/hr over 90 Minutes Intravenous Every 24 hours 03/10/24 1056 03/10/24 1250    03/10/24 0945  azithromycin (ZITHROMAX) 500 mg in sodium chloride 0.9 % 250 mL IVPB        500 mg 250 mL/hr over 60 Minutes Intravenous Every 24 hours 03/10/24 0934 03/12/24 1024   03/10/24 0200  metroNIDAZOLE (FLAGYL) IVPB 500 mg  Status:  Discontinued        500 mg 100 mL/hr over 60 Minutes Intravenous Every 12 hours 03/09/24 1515 03/10/24 0933   03/10/24 0000  ceFEPIme (MAXIPIME) 2 g in sodium chloride 0.9 % 100 mL IVPB  Status:  Discontinued        2 g 200 mL/hr over 30 Minutes Intravenous Every 12 hours 03/09/24 1541 03/13/24 1148   03/09/24 1600  vancomycin (VANCOCIN) IVPB 1000 mg/200 mL premix        1,000 mg 200 mL/hr over 60 Minutes Intravenous  Once 03/09/24 1537 03/09/24 1834   03/09/24 1537  vancomycin variable dose per unstable renal function (pharmacist dosing)  Status:  Discontinued         Does not apply See admin instructions 03/09/24 1537 03/10/24 1056   03/09/24 1515  metroNIDAZOLE (FLAGYL) IVPB 500 mg  Status:  Discontinued        500 mg 100 mL/hr over 60 Minutes Intravenous Every 12 hours 03/09/24 1504 03/09/24 1515   03/09/24 1200  ceFEPIme (MAXIPIME) 2 g in sodium chloride 0.9 % 100 mL IVPB        2 g 200 mL/hr over 30 Minutes Intravenous  Once 03/09/24 1152 03/09/24 1304   03/09/24 1200  metroNIDAZOLE (FLAGYL) IVPB 500 mg        500 mg 100 mL/hr over 60 Minutes Intravenous  Once 03/09/24 1152 03/09/24 1505   03/09/24 1200  vancomycin (VANCOCIN) IVPB 1000 mg/200 mL premix        1,000 mg 200 mL/hr over 60 Minutes Intravenous  Once 03/09/24 1152 03/09/24 1523              Family Communication/Anticipated D/C date and plan/Code Status   DVT prophylaxis: enoxaparin (LOVENOX) injection 40 mg Start: 03/26/24 2200 Place TED hose Start: 03/09/24 1502     Code Status: Full Code  Family Communication: Plan discussed with his wife at the bedside Disposition Plan: Plan to discharge to SNF   Status is: Inpatient Remains inpatient appropriate because:  Persistent ileus       Subjective:   Interval events noted.  He complains of nausea and cough productive of "phlegm".  No vomiting.  Abdominal pain is better.  Nettie Elm, wife at the bedside  Objective:    Vitals:   03/28/24 1610 03/28/24 0323 03/28/24 0332 03/28/24 0822  BP:  139/62  128/70 118/66  Pulse: (!) 52  (!) 50 65  Resp: 16  15 16   Temp: (!) 97.4 F (36.3 C)   98.1 F (36.7 C)  TempSrc:    Oral  SpO2: 97%   96%  Weight:  81.4 kg    Height:       No data found.   Intake/Output Summary (Last 24 hours) at 03/28/2024 1222 Last data filed at 03/28/2024 1200 Gross per 24 hour  Intake 2166.13 ml  Output 2925 ml  Net -758.87 ml   Filed Weights   03/27/24 0500 03/28/24 0013 03/28/24 0323  Weight: 81.6 kg 81.4 kg 81.4 kg    Exam:  GEN: NAD, sitting up in the chair SKIN: Warm and dry EYES: No pallor or icterus ENT: MMM, NG tube to intermittent wall suction with dark greenish fluid in canister CV: RRR PULM: CTA B ABD: soft, mild distention, NT, +BS, + ileostomy CNS: AAO x 3, non focal EXT: No edema or tenderness      Data Reviewed:   I have personally reviewed following labs and imaging studies:  Labs: Labs show the following:   Basic Metabolic Panel: Recent Labs  Lab 03/23/24 0405 03/25/24 0847 03/26/24 0409 03/27/24 0648 03/28/24 0843  NA 134* 135 137 138 142  K 4.7 5.3* 5.0 4.1 4.1  CL 103 100 102 106 109  CO2 25 27 27 25 24   GLUCOSE 125* 127* 107* 348* 135*  BUN 35* 45* 49* 39* 36*  CREATININE 0.96 1.42* 1.46* 1.32* 1.01  CALCIUM 8.3* 8.9 8.9 8.6* 8.8*  MG  --  2.6* 2.7* 2.3 2.4  PHOS  --  5.1* 5.4* 3.9 3.3   GFR Estimated Creatinine Clearance: 55.4 mL/min (by C-G formula based on SCr of 1.01 mg/dL). Liver Function Tests: Recent Labs  Lab 03/25/24 0847 03/26/24 0409 03/28/24 0843  AST 25 22 14*  ALT 26 25 15   ALKPHOS 57 63 47  BILITOT 0.8 1.1 0.8  PROT 6.7 6.6 6.5  ALBUMIN 2.7* 2.7* 3.3*   No results for input(s): "LIPASE",  "AMYLASE" in the last 168 hours. No results for input(s): "AMMONIA" in the last 168 hours. Coagulation profile No results for input(s): "INR", "PROTIME" in the last 168 hours.  CBC: Recent Labs  Lab 03/22/24 0605 03/25/24 0417 03/26/24 0409 03/27/24 0648  WBC 12.6* 18.1* 13.3* 10.3  HGB 12.1* 14.4 13.6 13.2  HCT 36.2* 42.5 41.2 40.7  MCV 96.5 96.4 98.6 99.5  PLT 236 257 260 243   Cardiac Enzymes: No results for input(s): "CKTOTAL", "CKMB", "CKMBINDEX", "TROPONINI" in the last 168 hours. BNP (last 3 results) No results for input(s): "PROBNP" in the last 8760 hours. CBG: Recent Labs  Lab 03/27/24 1117 03/27/24 1816 03/27/24 2324 03/28/24 0513 03/28/24 1118  GLUCAP 129* 126* 141* 135* 152*   D-Dimer: No results for input(s): "DDIMER" in the last 72 hours. Hgb A1c: No results for input(s): "HGBA1C" in the last 72 hours. Lipid Profile: Recent Labs    03/28/24 0843  TRIG 96   Thyroid function studies: No results for input(s): "TSH", "T4TOTAL", "T3FREE", "THYROIDAB" in the last 72 hours.  Invalid input(s): "FREET3" Anemia work up: No results for input(s): "VITAMINB12", "FOLATE", "FERRITIN", "TIBC", "IRON", "RETICCTPCT" in the last 72 hours. Sepsis Labs: Recent Labs  Lab 03/22/24 0605 03/25/24 0417 03/26/24 0409 03/27/24 0648  WBC 12.6* 18.1* 13.3* 10.3    Microbiology No results found for this or any previous visit (from the past 240 hours).  Procedures  and diagnostic studies:  DG ABD ACUTE 2+V W 1V CHEST Result Date: 03/28/2024 CLINICAL DATA:  Ileus. EXAM: DG ABDOMEN ACUTE WITH 1 VIEW CHEST COMPARISON:  Radiograph dated 03/27/2024. FINDINGS: Enteric tube coiled in the proximal stomach with tip in the distal stomach. Persistent dilatation of small-bowel loops measure up to 3.6 cm. Similar positioning of right-sided PICC. Shallow inspiration with bibasilar atelectasis. No consolidative changes or pneumothorax. Stable cardiac silhouette. No acute osseous  pathology. IMPRESSION: 1. Enteric tube with tip in the distal stomach. 2. Persistent small-bowel dilatation. Electronically Signed   By: Elgie Collard M.D.   On: 03/28/2024 11:16   DG Abd 1 View Result Date: 03/27/2024 CLINICAL DATA:  NG placement. EXAM: ABDOMEN - 1 VIEW COMPARISON:  Radiograph dated 03/27/2024. FINDINGS: Enteric tube with tip in the region of the distal stomach. Dilated small bowel loops measure up to 3.6 cm. IMPRESSION: Enteric tube with tip in the distal stomach. Electronically Signed   By: Elgie Collard M.D.   On: 03/27/2024 17:04   DG ABD ACUTE 2+V W 1V CHEST Result Date: 03/27/2024 CLINICAL DATA:  Ileus EXAM: DG ABDOMEN ACUTE WITH 1 VIEW CHEST COMPARISON:  03/21/2024 FINDINGS: Gaseous distention of the stomach and small bowel loops. Decompressed colon. Findings concerning for small bowel obstruction. No organomegaly or free air. Mild elevation of the right hemidiaphragm. No confluent opacities or effusions. Heart mediastinal contours within normal limits. IMPRESSION: Dilated stomach and small bowel concerning for small bowel obstruction. Electronically Signed   By: Charlett Nose M.D.   On: 03/27/2024 11:28               LOS: 19 days   Mikiya Nebergall  [facility]   Pager on [email or web address]. If 7PM-7AM, please contact night-coverage at [email or web address]     03/28/2024, 12:22 PM

## 2024-03-28 NOTE — Plan of Care (Signed)
 Received report. Vital signs noted. Answers questions appropriately. NG to R nare draining brown stomach contents. Positive placement verified with air entry into stomach. Denies nausea. Connected to intermittent wall suction. PIV to L hand noted. Flushes well. Double lumen PICC to L upper arm infusing TPN at 40 via pump and D5NS @ 75 cc/hr via pump. No redness/swelling to sites at this time. Abdomen noted 4 lap sites with skin glue, good signs of healing. Ostomy draining brown stool. Assessment completed. Continue to monitor. Repositioned. Asking for pain med and given X2 2245 Sleeping when checked. Rouses easily. Monitored.  Reports nauseated and given antiemetic.  0500 Sleeping when checked. Slept 5-6hrs.

## 2024-03-28 NOTE — Progress Notes (Signed)
 Physical Therapy Treatment Patient Details Name: Todd Rojas MRN: [id number] DOB: Apr 18, 1941 Today's Date: 03/28/2024   History of Present Illness Pt is an 82 year old male   MD work up including sepsis, acute hypoxic respiratory failure, PNA    Pmh significant for CKD stage IIIa, chronic gout, hypertension, hyperlipidemia, interstitial pulmonary disease, bronchiectasis, COPD. Pt is s/p exploratory lapileostomy and decompression of cecum on 4/2.    PT Comments  Pt was asleep in long sitting upon arrival. NGT capped during session but replace to suction afterwards." I feel like I've been run over by a truck."  He remains overall lethargic during session but was cooperative and pleasant. Supportive spouse present throughout. Pt required a little assistance to exit bed, stand to RW, and tolerate gait ~ 120 ft. Once back in room, pt was repositioned in recliner with call bell in reach and all needs within reach.    If plan is discharge home, recommend the following: Assist for transportation;Help with stairs or ramp for entrance;A little help with bathing/dressing/bathroom;A little help with walking and/or transfers;Assistance with cooking/housework     Equipment Recommendations  Rolling walker (2 wheels);BSC/3in1       Precautions / Restrictions Precautions Precautions: Fall Recall of Precautions/Restrictions: Intact Restrictions Weight Bearing Restrictions Per Provider Order: No     Mobility  Bed Mobility Overal bed mobility: Needs Assistance Bed Mobility: Supine to Sit  Supine to sit: Min assist  General bed mobility comments: min assist to safely acheive EOB sitting    Transfers Overall transfer level: Needs assistance Equipment used: Rolling walker (2 wheels) Transfers: Sit to/from Stand Sit to Stand: Contact guard assist  General transfer comment: CGA for safety with vcs for technqiue improvements    Ambulation/Gait Ambulation/Gait assistance: Contact guard  assist Gait Distance (Feet): 120 Feet Assistive device: Rolling walker (2 wheels) Gait Pattern/deviations: Step-through pattern, Decreased stride length Gait velocity: decreased  General Gait Details: pt was able to ambulate ~ 120 ft with Rw + author pushing IV pole. NGT capped butplaced back to suction after session.   Balance Overall balance assessment: Needs assistance Sitting-balance support: Feet supported, Bilateral upper extremity supported Sitting balance-Leahy Scale: Good     Standing balance support: Bilateral upper extremity supported, During functional activity, Reliant on assistive device for balance Standing balance-Leahy Scale: Fair       Hotel manager: Impaired  Cognition Arousal: Alert Behavior During Therapy: WFL for tasks assessed/performed   PT - Cognitive impairments: No apparent impairments      PT - Cognition Comments: Pt is A but somewhat lethagic Following commands: Intact      Cueing Cueing Techniques: Verbal cues, Tactile cues     General Comments General comments (skin integrity, edema, etc.): reviewed the importance of mobility and ambulation even when he feels poorly.      Pertinent Vitals/Pain Pain Assessment Pain Assessment: 0-10 Pain Score: 6  Pain Location: abdomen Pain Descriptors / Indicators: Sore, Discomfort Pain Intervention(s): Limited activity within patient's tolerance, Monitored during session, Premedicated before session, Repositioned     PT Goals (current goals can now be found in the care plan section) Acute Rehab PT Goals Patient Stated Goal: to feel better Progress towards PT goals: Progressing toward goals    Frequency    Min 2X/week           Co-evaluation     PT goals addressed during session: Mobility/safety with mobility;Balance;Proper use of DME;Strengthening/ROM        AM-PAC PT "6 Clicks"  Mobility   Outcome Measure  Help needed turning from your back to your side  while in a flat bed without using bedrails?: A Little Help needed moving from lying on your back to sitting on the side of a flat bed without using bedrails?: A Little Help needed moving to and from a bed to a chair (including a wheelchair)?: A Little Help needed standing up from a chair using your arms (e.g., wheelchair or bedside chair)?: A Little Help needed to walk in hospital room?: A Little Help needed climbing 3-5 steps with a railing? : A Lot 6 Click Score: 17    End of Session   Activity Tolerance: Patient tolerated treatment well Patient left: in chair;with call bell/phone within reach;with chair alarm set;with family/visitor present Nurse Communication: Mobility status PT Visit Diagnosis: Other abnormalities of gait and mobility (R26.89);Difficulty in walking, not elsewhere classified (R26.2);Muscle weakness (generalized) (M62.81)     Time: 1027-2536 PT Time Calculation (min) (ACUTE ONLY): 15 min  Charges:    $Therapeutic Activity: 8-22 mins PT General Charges $$ ACUTE PT VISIT: 1 Visit                    Jetta Lout PTA 03/28/24, 12:31 PM

## 2024-03-28 NOTE — Progress Notes (Signed)
 Mobility Specialist - Progress Note   03/28/24 1113  Mobility  Activity Transferred from chair to bed  Level of Assistance Standby assist, set-up cues, supervision of patient - no hands on  Assistive Device Front wheel walker  Distance Ambulated (ft) 4 ft  Activity Response Tolerated well  Mobility visit 1 Mobility  Mobility Specialist Start Time (ACUTE ONLY) 1021  Mobility Specialist Stop Time (ACUTE ONLY) 1030  Mobility Specialist Time Calculation (min) (ACUTE ONLY) 9 min   Zetta Bills Mobility Specialist 03/28/24 11:14 AM

## 2024-03-28 NOTE — Progress Notes (Signed)
 PHARMACY - TOTAL PARENTERAL NUTRITION CONSULT NOTE   Indication: Prolonged illeus  Patient Measurements: Height: 5\' 9"  (175.3 cm) Weight: 81.4 kg (179 lb 6.4 oz) IBW/kg (Calculated) : 70.7 TPN AdjBW (KG): 77.3 Body mass index is 26.49 kg/m.  Assessment: 83 y.o. 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   Glucose / Insulin:  BG 126-141 5u SSI last 24h Electrolytes: WNL Renal: SCr ~ 1. UOP not being documented strictly Hepatic: LFTs within normal limits Intake / Output; I&O not being strictly documented MIVF: N/A GI Imaging:Serial KUB prn --No new pertinent imaging studies  4/7: abd CT: postoperative ileus  GI Surgeries / Procedures:  4/2-s/p surgery-robotic assisted laparoscopic loop ileostomy with decompression of cecum for Ogilvie Syndrome,    Central access: 03/18/24 TPN start date: 03/18/24>>4/5, resumed 4/9  Nutritional Goals: Goal TPN rate is 80 mL/hr (provides 105.6 g of protein and 2042.9 kcals per day)  RD Assessment: Estimated Needs Total Energy Estimated Needs: 1900-2200kcal/day Total Protein Estimated Needs: 95-110g/day Total Fluid Estimated Needs: 1.9-2.2L/day  Current Nutrition:  NPO  Plan:  Increase TPN to 80 mL/hr at 1800 Electrolytes in TPN: Na 3mEq/L, K 43mEq/L, Ca 53mEq/L, Mg 35mEq/L, and Phos 98mmol/L. Cl:Ac 1:1 Add standard MVI and trace elements to TPN Thiamine 100 mg IV daily x 7 days per dietary Initiate Sensitive q6h SSI and adjust as needed  Monitor TPN labs on Mon/Thurs, and daily until stable  Barrie Folk, PharmD Clinical Pharmacist 03/28/2024

## 2024-03-29 ENCOUNTER — Inpatient Hospital Stay

## 2024-03-29 DIAGNOSIS — A419 Sepsis, unspecified organism: Secondary | ICD-10-CM | POA: Diagnosis not present

## 2024-03-29 DIAGNOSIS — R6520 Severe sepsis without septic shock: Secondary | ICD-10-CM | POA: Diagnosis not present

## 2024-03-29 LAB — COMPREHENSIVE METABOLIC PANEL WITH GFR
ALT: 16 U/L (ref 0–44)
AST: 14 U/L — ABNORMAL LOW (ref 15–41)
Albumin: 3.3 g/dL — ABNORMAL LOW (ref 3.5–5.0)
Alkaline Phosphatase: 49 U/L (ref 38–126)
Anion gap: 8 (ref 5–15)
BUN: 34 mg/dL — ABNORMAL HIGH (ref 8–23)
CO2: 23 mmol/L (ref 22–32)
Calcium: 9.1 mg/dL (ref 8.9–10.3)
Chloride: 111 mmol/L (ref 98–111)
Creatinine, Ser: 0.95 mg/dL (ref 0.61–1.24)
GFR, Estimated: >60 mL/min (ref >60)
Glucose, Bld: 156 mg/dL — ABNORMAL HIGH (ref 70–99)
Potassium: 4.4 mmol/L (ref 3.5–5.1)
Sodium: 142 mmol/L (ref 135–145)
Total Bilirubin: 0.7 mg/dL (ref 0.0–1.2)
Total Protein: 6.8 g/dL (ref 6.5–8.1)

## 2024-03-29 LAB — GLUCOSE, CAPILLARY
Glucose-Capillary: 153 mg/dL — ABNORMAL HIGH (ref 70–99)
Glucose-Capillary: 162 mg/dL — ABNORMAL HIGH (ref 70–99)
Glucose-Capillary: 167 mg/dL — ABNORMAL HIGH (ref 70–99)
Glucose-Capillary: 178 mg/dL — ABNORMAL HIGH (ref 70–99)
Glucose-Capillary: 193 mg/dL — ABNORMAL HIGH (ref 70–99)

## 2024-03-29 LAB — CBC
HCT: 41.1 % (ref 39.0–52.0)
Hemoglobin: 13.2 g/dL (ref 13.0–17.0)
MCH: 32.6 pg (ref 26.0–34.0)
MCHC: 32.1 g/dL (ref 30.0–36.0)
MCV: 101.5 fL — ABNORMAL HIGH (ref 80.0–100.0)
Platelets: 224 10*3/uL (ref 150–400)
RBC: 4.05 MIL/uL — ABNORMAL LOW (ref 4.22–5.81)
RDW: 14 % (ref 11.5–15.5)
WBC: 6.4 10*3/uL (ref 4.0–10.5)
nRBC: 0 % (ref 0.0–0.2)

## 2024-03-29 LAB — PHOSPHORUS: Phosphorus: 3.2 mg/dL (ref 2.5–4.6)

## 2024-03-29 LAB — MAGNESIUM: Magnesium: 2.3 mg/dL (ref 1.7–2.4)

## 2024-03-29 MED ORDER — ACETAMINOPHEN 10 MG/ML IV SOLN
1000.0000 mg | Freq: Four times a day (QID) | INTRAVENOUS | Status: AC
  Administered 2024-03-29 – 2024-03-30 (×3): 1000 mg via INTRAVENOUS
  Filled 2024-03-29 (×5): qty 100

## 2024-03-29 MED ORDER — ADULT MULTIVITAMIN W/MINERALS CH
1.0000 | ORAL_TABLET | Freq: Every day | ORAL | Status: DC
  Administered 2024-03-30: 1
  Filled 2024-03-29: qty 1

## 2024-03-29 MED ORDER — ALLOPURINOL 100 MG PO TABS
300.0000 mg | ORAL_TABLET | Freq: Every day | ORAL | Status: DC
  Administered 2024-03-30: 300 mg
  Filled 2024-03-29: qty 3

## 2024-03-29 MED ORDER — ASPIRIN 81 MG PO CHEW
81.0000 mg | CHEWABLE_TABLET | Freq: Every day | ORAL | Status: DC
  Administered 2024-03-29 – 2024-04-01 (×4): 81 mg
  Filled 2024-03-29 (×5): qty 1

## 2024-03-29 MED ORDER — ALUM & MAG HYDROXIDE-SIMETH 200-200-20 MG/5ML PO SUSP: 30.0000 mL | ORAL | Status: DC | PRN

## 2024-03-29 MED ORDER — AMLODIPINE BESYLATE 5 MG PO TABS
5.0000 mg | ORAL_TABLET | Freq: Every day | ORAL | Status: DC
  Administered 2024-03-30: 5 mg
  Filled 2024-03-29: qty 1

## 2024-03-29 MED ORDER — SIMETHICONE 80 MG PO CHEW
80.0000 mg | CHEWABLE_TABLET | Freq: Four times a day (QID) | ORAL | Status: DC
  Administered 2024-03-29 – 2024-03-30 (×4): 80 mg
  Filled 2024-03-29 (×5): qty 1

## 2024-03-29 MED ORDER — DICYCLOMINE HCL 10 MG PO CAPS: 10.0000 mg | ORAL_CAPSULE | Freq: Three times a day (TID) | ORAL | Status: DC | PRN

## 2024-03-29 MED ORDER — TRAVASOL 10 % IV SOLN
INTRAVENOUS | Status: AC
  Filled 2024-03-29: qty 1056

## 2024-03-29 NOTE — Plan of Care (Signed)
  Problem: Fluid Volume: Goal: Hemodynamic stability will improve Outcome: Progressing   Problem: Clinical Measurements: Goal: Diagnostic test results will improve Outcome: Progressing Goal: Signs and symptoms of infection will decrease Outcome: Progressing   Problem: Respiratory: Goal: Ability to maintain adequate ventilation will improve Outcome: Progressing   Problem: Education: Goal: Knowledge of General Education information will improve Description: Including pain rating scale, medication(s)/side effects and non-pharmacologic comfort measures Outcome: Progressing   Problem: Health Behavior/Discharge Planning: Goal: Ability to manage health-related needs will improve Outcome: Progressing   Problem: Clinical Measurements: Goal: Ability to maintain clinical measurements within normal limits will improve Outcome: Progressing Goal: Will remain free from infection Outcome: Progressing Goal: Diagnostic test results will improve Outcome: Progressing Goal: Respiratory complications will improve Outcome: Progressing Goal: Cardiovascular complication will be avoided Outcome: Progressing   Problem: Activity: Goal: Risk for activity intolerance will decrease Outcome: Progressing   Problem: Nutrition: Goal: Adequate nutrition will be maintained Outcome: Progressing   Problem: Coping: Goal: Level of anxiety will decrease Outcome: Progressing   Problem: Pain Managment: Goal: General experience of comfort will improve and/or be controlled Outcome: Progressing   Problem: Safety: Goal: Ability to remain free from injury will improve Outcome: Progressing   Problem: Elimination: Goal: Will not experience complications related to bowel motility Outcome: Progressing Goal: Will not experience complications related to urinary retention Outcome: Progressing   Problem: Coping: Goal: Ability to adjust to condition or change in health will improve Outcome: Progressing    Problem: Education: Goal: Ability to describe self-care measures that may prevent or decrease complications (Diabetes Survival Skills Education) will improve Outcome: Progressing Goal: Individualized Educational Video(s) Outcome: Progressing   Problem: Fluid Volume: Goal: Ability to maintain a balanced intake and output will improve Outcome: Progressing   Problem: Health Behavior/Discharge Planning: Goal: Ability to identify and utilize available resources and services will improve Outcome: Progressing Goal: Ability to manage health-related needs will improve Outcome: Progressing

## 2024-03-29 NOTE — Progress Notes (Signed)
 Progress Note    Todd Rojas  VWU:[id number] DOB: August 04, 1941  DOA: 03/09/2024 PCP: Barbette Reichmann, MD      Brief Narrative:    Medical records reviewed and are as summarized below:  Todd Rojas is a 83 y.o. male  with history of CKD stage IIIa, chronic gout, hypertension, hyperlipidemia, interstitial pulmonary disease, bronchiectasis, COPD, who presents emergency department for chief concerns of fever, chills, shortness of breath for 2 weeks.   Vitals in the ED showed temperature of 98.4, respiration rate 25, heart rate 94, blood pressure initially 96/69 and improved to 107/66, SpO2 initially desatted to 88% on room air   Status post exploratory laparotomy with robotic assisted laparoscopic loop ileostomy and decompression of cecum on 4/2.  Tolerated procedure well.  No ischemic gut noted   4/7: Worsening nausea.  Inability to tolerate p.o.  Repeat KUB demonstrating ileus.  General surgery reengaged.  Ordering CT abdomen.   4/8: Conservative management per surgery.  No evidence of obstruction.  CT reassuring.  Diet scaled back to n.p.o. for now.  Clear liquids later today if stable.      Assessment/Plan:   Principal Problem:   Severe sepsis with acute organ dysfunction (HCC) Active Problems:   Fever   Acute hypoxic respiratory failure (HCC)   CAD (coronary artery disease)   Elevated troponin   Gaseous abdominal distention   COPD (chronic obstructive pulmonary disease) (HCC)   Elevated serum creatinine   Hypertension   Hyperlipidemia, unspecified   Primary osteoarthritis of both knees   Thyroid nodule greater than or equal to 1.5 cm in diameter incidentally noted on imaging study   Dyslipidemia   Personal history of gout   Ogilvie syndrome   Protein-calorie malnutrition, severe   Nutrition Problem: Severe Malnutrition Etiology: acute illness  Signs/Symptoms: mild fat depletion, moderate fat depletion, mild muscle depletion, moderate  muscle depletion   Body mass index is 26.18 kg/m.   Gaseous abdominal distention 2/2 Colonic ileus  Ogilvie's syndrome Patient developed abdominal distention and crampy abdominal pain after presentation. Starting 3/25, Imaging with right colonic gaseous distention without any obvious obstruction.  Colonic distention has been worsening. --GenSurg and GI consulted, dx with Ogilvie's syndrome --neostigmine 2 mg push morning of 3/29 with [name] and [name] present with passing of gas and BM afterwards, however, colonic distention unchanged, so pt underwent endoscopic decompression on 3/30.  Unfortunately colonic gas re-accumulated. --started TPN on 3/31 Robotic assisted laparoscopic loop ileostomy with decompression of cecum on 03/20/2024 --Was clinically improving then had deterioration on 4/7.  He was made n.p.o. and was subsequently transitioned to clear liquid diet. He had abdominal pain and vomiting on 03/27/2024 so NGT was replaced. Started on TPN for nutrition on 03/27/2024 He continues to have dark greenish drainage from NG tube.  NG tube will remain in place for now and will continue with TPN for nutrition per surgeon's recommendations. Encouraged ambulation.  Nursing staff to ambulate patient every shift as tolerated    Bradycardia, recurrent Initially noted secondary to neostigmine as administration.  Heart rate was down to 20s.  Patient received atropine.  Heart rate improved.  On 4/3 noted to be bradycardic, asymptomatic with heart rate in the 40s to 50s.  Beta-blockade was held. Use IV metoprolol as needed    Severe sepsis with acute organ dysfunction (HCC) 2/2 PNA Elevated lactic acid of 2.6, increased respiration rate, with fever of 102 F at home, organ involvement are cardiac and pulmonary with  hypoxic respiratory failure at 88% on room air, elevated high sensitive troponin of 98 and elevated BNP --Imaging concerning for right lower lobe pneumonia, although not much upper  respiratory symptoms.  Procalcitonin at 0.352. Respiratory panels were negative. UA was not consistent with UTI Patient received broad-spectrum antibiotics Completed antibiotic course    Acute hypoxic respiratory failure (HCC) Resolved.  He is tolerating room air    Elevated troponin 2/2 demand ischemia First high sensitivity troponin was elevated at 98 >>101>175>226,  Suspect secondary to demand ischemia in setting of severe sepsis with acute hypoxic respiratory failure Echocardiogram with EF of 40% and regional wall motion abnormalities, discussed with cardiology and they think these abnormalities are chronic. Patient was also experiencing intermittent chest pain over the past couple of months which seems more exertional --cardio consulted, no plan for cardiac workup    CAD (coronary artery disease) With history of anterior STEMI in 2006, transferred to [facility] status post cardiac catheterization with PCI to mid LAD using Cypher stent; currently no longer on Plavix Continue aspirin and statin    COPD (chronic obstructive pulmonary disease) (HCC) Bronchodilators as needed     Elevated serum creatinine Creatinine is stable.  Does not meet acute kidney injury criteria, suspect prerenal in setting of nausea vomiting Serum creatinine baseline is 1.35-1.36/eGFR 52-55, consistent with CKD stage III A Monitor BMP.  Minimize use of nephrotoxins     Thyroid nodule greater than or equal to 1.5 cm in diameter incidentally noted on imaging study Incidental finding of a 2.7 cm thyroid nodule. -Thyroid ultrasound with solitary nodule.  Outpatient biopsy recommended --TSH wnl    Rash Developed morning of 3/28, all over chest, back and bilateral thighs.  Appeared to be allergic rxn.  Rash improved with prednisone.  Completed 5 days of prednisone 40 mg daily.   Type II DM, hyperglycemia:  NovoLog as needed for hyperglycemia.  Hemoglobin A1c 6.5    Comorbidities include hypertension,  hyperlipidemia, osteoarthritis of both knees s/p bilateral knee arthroplasty with mild right knee effusion, gout    Diet Order             Diet NPO time specified Except for: Sips with Meds, Ice Chips  Diet effective now                            Consultants: Arts development officer Cardiologist  Procedures: Flexible sigmoidoscopy on 03/17/2024 Robotic assisted laparoscopic loop ileostomy with decompression of cecum on 03/20/2024    Medications:    acetaminophen  1,000 mg Oral Q6H   allopurinol  300 mg Oral Daily   amLODipine  5 mg Oral Daily   aspirin EC  81 mg Oral Daily   Chlorhexidine Gluconate Cloth  6 each Topical Daily   diclofenac Sodium  2 g Topical TID   enoxaparin (LOVENOX) injection  40 mg Subcutaneous Q24H   feeding supplement  1 Container Oral TID BM   insulin aspart  0-9 Units Subcutaneous Q6H   metoprolol tartrate  2.5 mg Intravenous Q8H   mometasone-formoterol  2 puff Inhalation BID   multivitamin with minerals  1 tablet Oral Daily   pantoprazole (PROTONIX) IV  40 mg Intravenous Daily   simethicone  80 mg Oral QID   sodium chloride flush  10-40 mL Intracatheter Q12H   thiamine (VITAMIN B1) injection  100 mg Intravenous Daily   Continuous Infusions:  promethazine (PHENERGAN) injection (IM or IVPB) 12.5 mg (03/29/24 0245)  TPN ADULT (ION) 80 mL/hr at 03/28/24 1900   TPN ADULT (ION)       Anti-infectives (From admission, onward)    Start     Dose/Rate Route Frequency Ordered Stop   03/20/24 1315  cefoTEtan (CEFOTAN) 2 g in sodium chloride 0.9 % 100 mL IVPB  Status:  Discontinued        2 g 200 mL/hr over 30 Minutes Intravenous  Once 03/20/24 1228 03/24/24 0924   03/20/24 0830  cefoTEtan (CEFOTAN) 2 g in sodium chloride 0.9 % 100 mL IVPB        2 g 200 mL/hr over 30 Minutes Intravenous  Once 03/19/24 1739 03/20/24 0825   03/13/24 1200  levofloxacin (LEVAQUIN) tablet 750 mg        750 mg Oral Daily 03/13/24 1148 03/15/24  1413   03/10/24 1600  vancomycin (VANCOREADY) IVPB 1250 mg/250 mL  Status:  Discontinued        1,250 mg 166.7 mL/hr over 90 Minutes Intravenous Every 24 hours 03/10/24 1056 03/10/24 1250   03/10/24 0945  azithromycin (ZITHROMAX) 500 mg in sodium chloride 0.9 % 250 mL IVPB        500 mg 250 mL/hr over 60 Minutes Intravenous Every 24 hours 03/10/24 0934 03/12/24 1024   03/10/24 0200  metroNIDAZOLE (FLAGYL) IVPB 500 mg  Status:  Discontinued        500 mg 100 mL/hr over 60 Minutes Intravenous Every 12 hours 03/09/24 1515 03/10/24 0933   03/10/24 0000  ceFEPIme (MAXIPIME) 2 g in sodium chloride 0.9 % 100 mL IVPB  Status:  Discontinued        2 g 200 mL/hr over 30 Minutes Intravenous Every 12 hours 03/09/24 1541 03/13/24 1148   03/09/24 1600  vancomycin (VANCOCIN) IVPB 1000 mg/200 mL premix        1,000 mg 200 mL/hr over 60 Minutes Intravenous  Once 03/09/24 1537 03/09/24 1834   03/09/24 1537  vancomycin variable dose per unstable renal function (pharmacist dosing)  Status:  Discontinued         Does not apply See admin instructions 03/09/24 1537 03/10/24 1056   03/09/24 1515  metroNIDAZOLE (FLAGYL) IVPB 500 mg  Status:  Discontinued        500 mg 100 mL/hr over 60 Minutes Intravenous Every 12 hours 03/09/24 1504 03/09/24 1515   03/09/24 1200  ceFEPIme (MAXIPIME) 2 g in sodium chloride 0.9 % 100 mL IVPB        2 g 200 mL/hr over 30 Minutes Intravenous  Once 03/09/24 1152 03/09/24 1304   03/09/24 1200  metroNIDAZOLE (FLAGYL) IVPB 500 mg        500 mg 100 mL/hr over 60 Minutes Intravenous  Once 03/09/24 1152 03/09/24 1505   03/09/24 1200  vancomycin (VANCOCIN) IVPB 1000 mg/200 mL premix        1,000 mg 200 mL/hr over 60 Minutes Intravenous  Once 03/09/24 1152 03/09/24 1523              Family Communication/Anticipated D/C date and plan/Code Status   DVT prophylaxis: enoxaparin (LOVENOX) injection 40 mg Start: 03/26/24 2200 Place TED hose Start: 03/09/24 1502     Code Status:  Full Code  Family Communication: Plan discussed with Tammie, daughter, at the bedside.  All her questions were answered. Disposition Plan: Plan to discharge to SNF   Status is: Inpatient Remains inpatient appropriate because: Persistent ileus       Subjective:   Interval events noted.  He  complains of "phlegms" in his mouth and abdominal pain (rated as 4 on a 10 point scale).  Tammy, daughter, was at the bedside.    Objective:    Vitals:   03/28/24 2016 03/29/24 0211 03/29/24 0500 03/29/24 0808  BP: 117/66 117/64  (!) 140/68  Pulse: 80 92  65  Resp: 16 20  16   Temp: 97.8 F (36.6 C) (!) 97.4 F (36.3 C)  97.6 F (36.4 C)  TempSrc: Oral Oral    SpO2: 93% 93%  94%  Weight:   80.4 kg   Height:       No data found.   Intake/Output Summary (Last 24 hours) at 03/29/2024 1011 Last data filed at 03/29/2024 0600 Gross per 24 hour  Intake 435.63 ml  Output 1675 ml  Net -1239.37 ml   Filed Weights   03/28/24 0013 03/28/24 0323 03/29/24 0500  Weight: 81.4 kg 81.4 kg 80.4 kg    Exam:  GEN: NAD sitting up in the chair SKIN: Warm and dry EYES: No pallor or ENT: MMM, NG tube connected to wall suction with dark greenish fluid drainage CV: RRR PULM: CTA B ABD: soft, mildly distended, NT, +BS CNS: AAO x 3, non focal EXT: No edema or tenderness       Data Reviewed:   I have personally reviewed following labs and imaging studies:  Labs: Labs show the following:   Basic Metabolic Panel: Recent Labs  Lab 03/25/24 0847 03/26/24 0409 03/27/24 0648 03/28/24 0843 03/29/24 0735  NA 135 137 138 142 142  K 5.3* 5.0 4.1 4.1 4.4  CL 100 102 106 109 111  CO2 27 27 25 24 23   GLUCOSE 127* 107* 348* 135* 156*  BUN 45* 49* 39* 36* 34*  CREATININE 1.42* 1.46* 1.32* 1.01 0.95  CALCIUM 8.9 8.9 8.6* 8.8* 9.1  MG 2.6* 2.7* 2.3 2.4 2.3  PHOS 5.1* 5.4* 3.9 3.3 3.2   GFR Estimated Creatinine Clearance: 58.9 mL/min (by C-G formula based on SCr of 0.95 mg/dL). Liver  Function Tests: Recent Labs  Lab 03/25/24 0847 03/26/24 0409 03/28/24 0843 03/29/24 0735  AST 25 22 14* 14*  ALT 26 25 15 16   ALKPHOS 57 63 47 49  BILITOT 0.8 1.1 0.8 0.7  PROT 6.7 6.6 6.5 6.8  ALBUMIN 2.7* 2.7* 3.3* 3.3*   No results for input(s): "LIPASE", "AMYLASE" in the last 168 hours. No results for input(s): "AMMONIA" in the last 168 hours. Coagulation profile No results for input(s): "INR", "PROTIME" in the last 168 hours.  CBC: Recent Labs  Lab 03/25/24 0417 03/26/24 0409 03/27/24 0648 03/29/24 0735  WBC 18.1* 13.3* 10.3 6.4  HGB 14.4 13.6 13.2 13.2  HCT 42.5 41.2 40.7 41.1  MCV 96.4 98.6 99.5 101.5*  PLT 257 260 243 224   Cardiac Enzymes: No results for input(s): "CKTOTAL", "CKMB", "CKMBINDEX", "TROPONINI" in the last 168 hours. BNP (last 3 results) No results for input(s): "PROBNP" in the last 8760 hours. CBG: Recent Labs  Lab 03/28/24 0513 03/28/24 1118 03/28/24 1832 03/28/24 2308 03/29/24 0459  GLUCAP 135* 152* 139* 169* 167*   D-Dimer: No results for input(s): "DDIMER" in the last 72 hours. Hgb A1c: Recent Labs    03/28/24 0648  HGBA1C 6.5*   Lipid Profile: Recent Labs    03/28/24 0843  TRIG 96   Thyroid function studies: No results for input(s): "TSH", "T4TOTAL", "T3FREE", "THYROIDAB" in the last 72 hours.  Invalid input(s): "FREET3" Anemia work up: No results for input(s): "VITAMINB12", "FOLATE", "  FERRITIN", "TIBC", "IRON", "RETICCTPCT" in the last 72 hours. Sepsis Labs: Recent Labs  Lab 03/25/24 0417 03/26/24 0409 03/27/24 0648 03/29/24 0735  WBC 18.1* 13.3* 10.3 6.4    Microbiology No results found for this or any previous visit (from the past 240 hours).  Procedures and diagnostic studies:  DG ABD ACUTE 2+V W 1V CHEST Result Date: 03/29/2024 CLINICAL DATA:  [id number] Ileus following gastrointestinal surgery (HCC) [id number]. EXAM: DG ABDOMEN ACUTE WITH 1 VIEW CHEST COMPARISON:  03/28/2024. FINDINGS: There is gaseous  dilatation of small bowel loops, which is disproportionate to the degree of distention of the colon, which may represent small bowel obstruction versus adynamic ileus. There is mild interval increase in the degree of dilation of bowel loops when compared to the recent prior exam. Small amount of stool noted in the ascending colon. Rest of the colon is collapsed. The No evidence of pneumoperitoneum. Low lung volume. Bilateral lung fields are clear. Bilateral costophrenic angles are clear. Normal cardio-mediastinal silhouette. No acute osseous abnormalities. The soft tissues are within normal limits. Surgical changes, devices, tubes and lines: Enteric tube seen with its tip and side hole overlying the right upper quadrant, overlying the pylorus/duodenal bulb region. IMPRESSION: 1. Mild interval increase in the degree of dilation of small bowel loops, which may represent worsening small bowel obstruction versus adynamic ileus. Correlate clinically. 2. No active cardiopulmonary disease. Electronically Signed   By: Jules Schick M.D.   On: 03/29/2024 08:35   DG ABD ACUTE 2+V W 1V CHEST Result Date: 03/28/2024 CLINICAL DATA:  Ileus. EXAM: DG ABDOMEN ACUTE WITH 1 VIEW CHEST COMPARISON:  Radiograph dated 03/27/2024. FINDINGS: Enteric tube coiled in the proximal stomach with tip in the distal stomach. Persistent dilatation of small-bowel loops measure up to 3.6 cm. Similar positioning of right-sided PICC. Shallow inspiration with bibasilar atelectasis. No consolidative changes or pneumothorax. Stable cardiac silhouette. No acute osseous pathology. IMPRESSION: 1. Enteric tube with tip in the distal stomach. 2. Persistent small-bowel dilatation. Electronically Signed   By: Elgie Collard M.D.   On: 03/28/2024 11:16   DG Abd 1 View Result Date: 03/27/2024 CLINICAL DATA:  NG placement. EXAM: ABDOMEN - 1 VIEW COMPARISON:  Radiograph dated 03/27/2024. FINDINGS: Enteric tube with tip in the region of the distal stomach.  Dilated small bowel loops measure up to 3.6 cm. IMPRESSION: Enteric tube with tip in the distal stomach. Electronically Signed   By: Elgie Collard M.D.   On: 03/27/2024 17:04               LOS: 20 days   Uzma Hellmer  [facility]   Pager on [email or web address]. If 7PM-7AM, please contact night-coverage at [email or web address]     03/29/2024, 10:11 AM

## 2024-03-29 NOTE — Progress Notes (Signed)
 Mobility Specialist - Progress Note   03/29/24 1002  Mobility  Activity Ambulated with assistance in hallway  Level of Assistance Standby assist, set-up cues, supervision of patient - no hands on  Assistive Device Front wheel walker  Distance Ambulated (ft) 130 ft  Activity Response Tolerated well  Mobility visit 1 Mobility  Mobility Specialist Start Time (ACUTE ONLY) 0941  Mobility Specialist Stop Time (ACUTE ONLY) 0955  Mobility Specialist Time Calculation (min) (ACUTE ONLY) 14 min   Pt sitting in the recliner upon entry, utilizing RA. Pt reported feeling fair this date. Pt STS to RW CGA and amb one lap around the NS MinG-SBA-- no hands on required. Pt demonstrated a consistent gait speed, required min cueing to remain within the RW. Pt returned to the room, left seated in the recliner with alarm set and needs within reach.  Zetta Bills Mobility Specialist 03/29/24 10:06 AM

## 2024-03-29 NOTE — Progress Notes (Signed)
 Occupational Therapy Treatment Patient Details Name: Todd Rojas MRN: [id number] DOB: [birth year] Today's Date: 03/29/2024   History of present illness Pt is an 83 year old male   MD work up including sepsis, acute hypoxic respiratory failure, PNA    Pmh significant for CKD stage IIIa, chronic gout, hypertension, hyperlipidemia, interstitial pulmonary disease, bronchiectasis, COPD. Pt is s/p exploratory lapileostomy and decompression of cecum on 4/2.   OT comments  Chart reviewed, pt greeted in bed, agreeable to OT tx session targeting improving functional activity tolerance in the setting of ADL tasks. Pt is grimacing during intervention, reports 3/10 pain throughout. Improvements noted in activity tolerance with pt amb in room approx 20' with RW with CGA-MIN A. Grooming tasks completed in sitting with MIN A in chair in front of sink. Pt continues to perform ADL/functional activity below PLOF, will continue to benefit from acute OT to address functional deficits.       If plan is discharge home, recommend the following:  A little help with walking and/or transfers;A little help with bathing/dressing/bathroom;Assistance with cooking/housework;Assist for transportation;Help with stairs or ramp for entrance   Equipment Recommendations  BSC/3in1;Tub/shower seat    Recommendations for Other Services      Precautions / Restrictions Precautions Precautions: Fall Recall of Precautions/Restrictions: Intact Restrictions Weight Bearing Restrictions Per Provider Order: No       Mobility Bed Mobility Overal bed mobility: Needs Assistance Bed Mobility: Rolling, Sidelying to Sit, Sit to Sidelying Rolling: Supervision Sidelying to sit: Supervision, HOB elevated     Sit to sidelying: Min assist, HOB elevated General bed mobility comments: assist for BLE    Transfers Overall transfer level: Needs assistance Equipment used: Rolling walker (2 wheels) Transfers: Sit to/from Stand Sit  to Stand: Contact guard assist                 Balance Overall balance assessment: Needs assistance Sitting-balance support: Feet supported, Bilateral upper extremity supported Sitting balance-Leahy Scale: Good     Standing balance support: Bilateral upper extremity supported, During functional activity, Reliant on assistive device for balance Standing balance-Leahy Scale: Fair                             ADL either performed or assessed with clinical judgement   ADL Overall ADL's : Needs assistance/impaired     Grooming: Minimal assistance;Sitting;Wash/dry face;Brushing hair Grooming Details (indicate cue type and reason): in chair at sink                 Toilet Transfer: Contact guard assist;Minimal assistance;Rolling walker (2 wheels);Ambulation;Cueing for sequencing Toilet Transfer Details (indicate cue type and reason): simulated         Functional mobility during ADLs: Contact guard assist;Minimal assistance;Rolling walker (2 wheels);Cueing for sequencing (approx 20' two attempts in room)      Extremity/Trunk Assessment              Vision       Perception     Praxis     Communication Communication Communication: Impaired Factors Affecting Communication: Hearing impaired   Cognition Arousal: Alert Behavior During Therapy: Flat affect Cognition: No apparent impairments Difficult to assess due to: Hard of hearing/deaf                             Following commands: Intact Following commands impaired: Follows one step commands with increased time  Cueing   Cueing Techniques: Verbal cues, Tactile cues  Exercises Other Exercises Other Exercises: edu re: role of OT, importance of continued progression of mobility- hand off provided to weekend staff, pain management to optimize mobility     Shoulder Instructions       General Comments vss throughout    Pertinent Vitals/ Pain       Pain Assessment Pain  Assessment: 0-10 Pain Score: 3  Pain Location: abdomen Pain Descriptors / Indicators: Sore, Discomfort Pain Intervention(s): Monitored during session, Repositioned, Limited activity within patient's tolerance  Home Living                                          Prior Functioning/Environment              Frequency  Min 2X/week        Progress Toward Goals  OT Goals(current goals can now be found in the care plan section)  Progress towards OT goals: Progressing toward goals  Acute Rehab OT Goals Time For Goal Achievement: 04/04/24  Plan      Co-evaluation                AM-PAC OT "6 Clicks" Daily Activity     Outcome Measure   Help from another person eating meals?: None Help from another person taking care of personal grooming?: A Little Help from another person toileting, which includes using toliet, bedpan, or urinal?: A Lot Help from another person bathing (including washing, rinsing, drying)?: A Lot Help from another person to put on and taking off regular upper body clothing?: A Little Help from another person to put on and taking off regular lower body clothing?: A Lot 6 Click Score: 16    End of Session Equipment Utilized During Treatment: Rolling walker (2 wheels)  OT Visit Diagnosis: Other abnormalities of gait and mobility (R26.89)   Activity Tolerance Patient tolerated treatment well   Patient Left with call bell/phone within reach;with bed alarm set;with family/visitor present;in bed (initially in chair with chair alarm, then assisted back to bed approx 10 min later)   Nurse Communication Mobility status        Time: 4098-1191 OT Time Calculation (min): 26 min  Charges: OT General Charges $OT Visit: 1 Visit OT Treatments $Self Care/Home Management : 8-22 mins $Therapeutic Activity: 8-22 mins  Oleta Mouse, OTD OTR/L  03/29/24, 3:39 PM

## 2024-03-29 NOTE — Progress Notes (Signed)
 PHARMACY - TOTAL PARENTERAL NUTRITION CONSULT NOTE   Indication: Prolonged illeus  Patient Measurements: Height: 5\' 9"  (175.3 cm) Weight: 80.4 kg (177 lb 4 oz) IBW/kg (Calculated) : 70.7 TPN AdjBW (KG): 77.3 Body mass index is 26.18 kg/m.  Assessment: 83 y.o. 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   Glucose / Insulin:  BG 135-169 7u SSI last 24h A1C 6.5 Electrolytes: WNL Renal: SCr ~ 1.  Hepatic: LFTs within normal limits Intake / Output;   Intake/Output Summary (Last 24 hours) at 03/29/2024 0829 Last data filed at 03/29/2024 0600 Gross per 24 hour  Intake 435.63 ml  Output 1675 ml  Net -1239.37 ml    MIVF: N/A GI Imaging:Serial KUB prn 4/7: abd CT: postoperative ileus  4/10: abd XR: Persistent small-bowel dilatation  GI Surgeries / Procedures:  4/2-s/p surgery-robotic assisted laparoscopic loop ileostomy with decompression of cecum for Ogilvie Syndrome,    Central access: 03/18/24 TPN start date: 03/18/24>>4/5, resumed 4/9  Nutritional Goals: Goal TPN rate is 80 mL/hr (provides 105.6 g of protein and 2042.9 kcals per day)  RD Assessment: Estimated Needs Total Energy Estimated Needs: 1900-2200kcal/day Total Protein Estimated Needs: 95-110g/day Total Fluid Estimated Needs: 1.9-2.2L/day  Current Nutrition:  NPO  Plan:  Increase TPN to 80 mL/hr at 1800 Electrolytes in TPN: Na 107mEq/L, K 36mEq/L, Ca 17mEq/L, Mg 69mEq/L, and Phos 45mmol/L. Cl:Ac 1:1 Add standard MVI and trace elements to TPN Thiamine 100 mg IV daily x 7 days per dietary Initiate Sensitive q6h SSI and adjust as needed  Monitor TPN labs on Mon/Thurs, and daily until stable  Barrie Folk, PharmD Clinical Pharmacist 03/29/2024

## 2024-03-29 NOTE — Progress Notes (Signed)
 Physical Therapy Treatment Patient Details Name: Todd Rojas MRN: [id number] DOB: Sep 11, 1941 Today's Date: 03/29/2024   History of Present Illness Pt is an 83 year old male   MD work up including sepsis, acute hypoxic respiratory failure, PNA    Pmh significant for CKD stage IIIa, chronic gout, hypertension, hyperlipidemia, interstitial pulmonary disease, bronchiectasis, COPD. Pt is s/p exploratory lapileostomy and decompression of cecum on 4/2.    PT Comments  Pt was long sitting in recliner  upon arrival.  He is alert but present with flat effect. Endorses feeling poorly but agreeable to getting up. RN in room and capped NGT. Pt tolerated standing and ambulating ~ 150 ft with RW. No LOB or safety concerns during gait however pt still endorses feeling poorly afterwards. Once back in room, pt requested to return to bed. RN made aware he was back in bed. Acute PT will continue to follow and progress per current POC.    If plan is discharge home, recommend the following: Assist for transportation;Help with stairs or ramp for entrance;A little help with bathing/dressing/bathroom;A little help with walking and/or transfers;Assistance with cooking/housework     Equipment Recommendations  Rolling walker (2 wheels);BSC/3in1       Precautions / Restrictions Precautions Precautions: Fall Recall of Precautions/Restrictions: Intact Restrictions Weight Bearing Restrictions Per Provider Order: No     Mobility  Bed Mobility Overal bed mobility: Needs Assistance Bed Mobility: Sit to Supine  Supine to sit: Min assist, Mod assist  General bed mobility comments: assisted BLEs into bed form EOB sitting    Transfers Overall transfer level: Needs assistance Equipment used: Rolling walker (2 wheels) Transfers: Sit to/from Stand Sit to Stand: Contact guard assist  General transfer comment: vcs for handplacement and improved fwd wt shift    Ambulation/Gait Ambulation/Gait assistance:  Contact guard assist Gait Distance (Feet): 150 Feet Assistive device: Rolling walker (2 wheels) Gait Pattern/deviations: Step-through pattern, Decreased stride length Gait velocity: decreased  General Gait Details: Pt was able to ambulate ~ 150 ft with RW without LOB or safety concerns.   Balance Overall balance assessment: Needs assistance Sitting-balance support: Feet supported, Bilateral upper extremity supported Sitting balance-Leahy Scale: Good     Standing balance support: Bilateral upper extremity supported, During functional activity, Reliant on assistive device for balance Standing balance-Leahy Scale: Fair     Hotel manager: Impaired Factors Affecting Communication: Hearing impaired  Cognition Arousal: Alert Behavior During Therapy: Flat affect   PT - Cognitive impairments: No apparent impairments    PT - Cognition Comments: pt does not volunteer any information but answers questions appropriately Following commands: Intact Following commands impaired: Follows one step commands with increased time    Cueing Cueing Techniques: Verbal cues, Tactile cues         Pertinent Vitals/Pain Pain Assessment Pain Assessment: 0-10 Pain Score: 3      PT Goals (current goals can now be found in the care plan section) Acute Rehab PT Goals Patient Stated Goal: to feel better Progress towards PT goals: Progressing toward goals    Frequency    Min 2X/week       Co-evaluation     PT goals addressed during session: Mobility/safety with mobility        AM-PAC PT "6 Clicks" Mobility   Outcome Measure  Help needed turning from your back to your side while in a flat bed without using bedrails?: A Little Help needed moving from lying on your back to sitting on the side of a  flat bed without using bedrails?: A Little Help needed moving to and from a bed to a chair (including a wheelchair)?: A Little Help needed standing up from a chair using  your arms (e.g., wheelchair or bedside chair)?: A Little Help needed to walk in hospital room?: A Little Help needed climbing 3-5 steps with a railing? : A Lot 6 Click Score: 17    End of Session Equipment Utilized During Treatment: Gait belt Activity Tolerance: Patient tolerated treatment well Patient left: in chair;with call bell/phone within reach;with chair alarm set;with family/visitor present Nurse Communication: Mobility status PT Visit Diagnosis: Other abnormalities of gait and mobility (R26.89);Difficulty in walking, not elsewhere classified (R26.2);Muscle weakness (generalized) (M62.81)     Time: 0865-7846 PT Time Calculation (min) (ACUTE ONLY): 17 min  Charges:    $Gait Training: 8-22 mins PT General Charges $$ ACUTE PT VISIT: 1 Visit                     Jetta Lout PTA 03/29/24, 1:19 PM

## 2024-03-29 NOTE — Progress Notes (Signed)
 [facility] SURGICAL PROGRESS NOTE  Hospital Day(s): 20.   Post op day(s): 9 Days Post-Op.   Interval History:  Patient seen and examined No acute issues overnight Patient with more nausea this morning Remains distended No fever, chills  Labs are pending this AM UO - 800 ccs NGT with 750 ccs out Ileostomy with  525 ccs out recorded  Vital signs in last 24 hours: [min-max] current  Temp:  [97.4 F (36.3 C)-98.9 F (37.2 C)] 97.4 F (36.3 C) (04/11 0211) Pulse Rate:  [65-92] 92 (04/11 0211) Resp:  [16-20] 20 (04/11 0211) BP: (117-129)/(64-73) 117/64 (04/11 0211) SpO2:  [92 %-96 %] 93 % (04/11 0211) Weight:  [80.4 kg] 80.4 kg (04/11 0500)     Height: 5\' 9"  (175.3 cm) Weight: 80.4 kg BMI (Calculated): 26.16   Intake/Output last 2 shifts:  04/10 0701 - 04/11 0700 In: 1965.1 [I.V.:1965.1] Out: 2375 [Urine:800; Emesis/NG output:750; Stool:525]   Physical Exam:  Constitutional: alert, awake, NAD HEENT: NGT in place; flushed; working well. Output still somewhat bilious in appearance Respiratory: breathing non-labored at rest  Cardiovascular: regular rate and sinus rhythm  Gastrointestinal: soft, he does not appear overtly tender, continues to be distended/tympanic but improved, no rebound/guarding. Ileostomy in RLQ, stool in bag this AM Integumentary: Laparoscopic incisions are CDI with dermabond   Labs:     Latest Ref Rng & Units 03/27/2024    6:48 AM 03/26/2024    4:09 AM 03/25/2024    4:17 AM  CBC  WBC 4.0 - 10.5 K/uL 10.3  13.3  18.1   Hemoglobin 13.0 - 17.0 g/dL 16.1  09.6  04.5   Hematocrit 39.0 - 52.0 % 40.7  41.2  42.5   Platelets 150 - 400 K/uL 243  260  257       Latest Ref Rng & Units 03/28/2024    8:43 AM 03/27/2024    6:48 AM 03/26/2024    4:09 AM  CMP  Glucose 70 - 99 mg/dL 409  811  914   BUN 8 - 23 mg/dL 36  39  49   Creatinine 0.61 - 1.24 mg/dL 7.82  9.56  2.13   Sodium 135 - 145 mmol/L 142  138  137   Potassium 3.5 - 5.1 mmol/L 4.1  4.1   5.0   Chloride 98 - 111 mmol/L 109  106  102   CO2 22 - 32 mmol/L 24  25  27    Calcium 8.9 - 10.3 mg/dL 8.8  8.6  8.9   Total Protein 6.5 - 8.1 g/dL 6.5   6.6   Total Bilirubin 0.0 - 1.2 mg/dL 0.8   1.1   Alkaline Phos 38 - 126 U/L 47   63   AST 15 - 41 U/L 14   22   ALT 0 - 44 U/L 15   25      Imaging studies:   KUB (03/29/2024) personally reviewed, NGT uncoiled, small bowel dilation remains, no free air, and radiologist report pending...    Assessment/Plan:  83 y.o. 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    - Continue NGT decompression; monitor and record output  - Continue TPN; monitor electrolytes - advance to goal   - Monitor renal function; improving  - No need for emergent surgical intervention   - Monitor abdominal examination; on-going  ileostomy function   - Pain control prn; minimize narcotics as feasible - Antiemetics prn   - Encouraged frequent mobilization as feasible - therapies following   - Further management per primary service; we will follow   - Discharge Planning: Not ready for discharge, ileus - no change today (04/11)  All of the above findings and recommendations were discussed with the patient, patient's family (wife at bedside), and the medical team, and all of their questions were answered to their expressed satisfaction.  -- Lynden Oxford, PA-C [facility] 03/29/2024, 7:39 AM M-F: 7am - 4pm

## 2024-03-30 ENCOUNTER — Inpatient Hospital Stay

## 2024-03-30 DIAGNOSIS — A419 Sepsis, unspecified organism: Secondary | ICD-10-CM | POA: Diagnosis not present

## 2024-03-30 DIAGNOSIS — R6520 Severe sepsis without septic shock: Secondary | ICD-10-CM | POA: Diagnosis not present

## 2024-03-30 LAB — COMPREHENSIVE METABOLIC PANEL WITH GFR
ALT: 17 U/L (ref 0–44)
AST: 15 U/L (ref 15–41)
Albumin: 3.3 g/dL — ABNORMAL LOW (ref 3.5–5.0)
Alkaline Phosphatase: 58 U/L (ref 38–126)
Anion gap: 9 (ref 5–15)
BUN: 38 mg/dL — ABNORMAL HIGH (ref 8–23)
CO2: 20 mmol/L — ABNORMAL LOW (ref 22–32)
Calcium: 9.3 mg/dL (ref 8.9–10.3)
Chloride: 113 mmol/L — ABNORMAL HIGH (ref 98–111)
Creatinine, Ser: 1 mg/dL (ref 0.61–1.24)
GFR, Estimated: >60 mL/min (ref >60)
Glucose, Bld: 145 mg/dL — ABNORMAL HIGH (ref 70–99)
Potassium: 4.9 mmol/L (ref 3.5–5.1)
Sodium: 142 mmol/L (ref 135–145)
Total Bilirubin: 0.8 mg/dL (ref 0.0–1.2)
Total Protein: 7.1 g/dL (ref 6.5–8.1)

## 2024-03-30 LAB — GLUCOSE, CAPILLARY
Glucose-Capillary: 134 mg/dL — ABNORMAL HIGH (ref 70–99)
Glucose-Capillary: 135 mg/dL — ABNORMAL HIGH (ref 70–99)
Glucose-Capillary: 140 mg/dL — ABNORMAL HIGH (ref 70–99)
Glucose-Capillary: 156 mg/dL — ABNORMAL HIGH (ref 70–99)

## 2024-03-30 MED ORDER — TRAVASOL 10 % IV SOLN
INTRAVENOUS | Status: AC
Start: 2024-03-30 — End: 2024-03-31
  Filled 2024-03-30: qty 1056

## 2024-03-30 MED ORDER — KETOROLAC TROMETHAMINE 15 MG/ML IJ SOLN
15.0000 mg | Freq: Four times a day (QID) | INTRAMUSCULAR | Status: AC | PRN
  Administered 2024-04-02 – 2024-04-03 (×3): 15 mg via INTRAVENOUS
  Filled 2024-03-30 (×3): qty 1

## 2024-03-30 MED ORDER — ACETAMINOPHEN 10 MG/ML IV SOLN
1000.0000 mg | Freq: Four times a day (QID) | INTRAVENOUS | Status: AC
  Administered 2024-03-30 – 2024-03-31 (×4): 1000 mg via INTRAVENOUS
  Filled 2024-03-30 (×4): qty 100

## 2024-03-30 NOTE — Progress Notes (Signed)
 Mobility Specialist - Progress Note   03/30/24 1612  Mobility  Activity Ambulated with assistance in hallway;Stood at bedside;Dangled on edge of bed  Level of Assistance Standby assist, set-up cues, supervision of patient - no hands on  Assistive Device Front wheel walker  Distance Ambulated (ft) 300 ft  Activity Response Tolerated well  Mobility Referral Yes  Mobility visit 1 Mobility  Mobility Specialist Start Time (ACUTE ONLY) 1544  Mobility Specialist Stop Time (ACUTE ONLY) 1608  Mobility Specialist Time Calculation (min) (ACUTE ONLY) 24 min   Pt supine in bed on RA upon arrival. Pt completes bed mobility, STS, and ambulates in hallway SBA with no LOB noted. Pt returns to bed with needs in reach and family present.   Wash Hack  Mobility Specialist  03/30/24 4:14 PM

## 2024-03-30 NOTE — Progress Notes (Signed)
 Subjective:  CC: Todd Rojas is a 83 y.o. 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   HPI: No acute complaints overnight.  ROS:  General: Denies weight loss, weight gain, fatigue, fevers, chills, and night sweats. Heart: Denies chest pain, palpitations, racing heart, irregular heartbeat, leg pain or swelling, and decreased activity tolerance. Respiratory: Denies breathing difficulty, shortness of breath, wheezing, cough, and sputum. GI: Denies change in appetite, heartburn, nausea, vomiting, constipation, diarrhea, and blood in stool. GU: Denies difficulty urinating, pain with urinating, urgency, frequency, blood in urine.   Objective:   Temp:  [97.5 F (36.4 C)-98.4 F (36.9 C)] 97.9 F (36.6 C) (04/12 0853) Pulse Rate:  [52-97] 97 (04/12 0853) Resp:  [16-20] 18 (04/12 0853) BP: (133-148)/(53-75) 133/64 (04/12 0853) SpO2:  [91 %-95 %] 93 % (04/12 0853) Weight:  [79.7 kg] 79.7 kg (04/12 0349)     Height: 5\' 9"  (175.3 cm) Weight: 79.7 kg BMI (Calculated): 25.92   Intake/Output this shift:   Intake/Output Summary (Last 24 hours) at 03/30/2024 1413 Last data filed at 03/30/2024 0750 Gross per 24 hour  Intake 0 ml  Output 1200 ml  Net -1200 ml    Constitutional :  alert, cooperative, appears stated age, and no distress  Respiratory:  clear to auscultation bilaterally  Cardiovascular:  regular rate and rhythm  Gastrointestinal: Soft, no guarding, no tenderness to palpation.  Slight distention with tympany noted.  NG with bilious output .   Skin: Cool and moist.  Incision sites clean dry and intact  Psychiatric: Normal affect, non-agitated, not confused       LABS:     Latest Ref Rng & Units 03/30/2024   12:32 PM 03/29/2024    7:35 AM 03/28/2024    8:43 AM  CMP  Glucose  70 - 99 mg/dL 324  401  027   BUN 8 - 23 mg/dL 38  34  36   Creatinine 0.61 - 1.24 mg/dL 2.53  6.64  4.03   Sodium 135 - 145 mmol/L 142  142  142   Potassium 3.5 - 5.1 mmol/L 4.9  4.4  4.1   Chloride 98 - 111 mmol/L 113  111  109   CO2 22 - 32 mmol/L 20  23  24    Calcium 8.9 - 10.3 mg/dL 9.3  9.1  8.8   Total Protein 6.5 - 8.1 g/dL 7.1  6.8  6.5   Total Bilirubin 0.0 - 1.2 mg/dL 0.8  0.7  0.8   Alkaline Phos 38 - 126 U/L 58  49  47   AST 15 - 41 U/L 15  14  14    ALT 0 - 44 U/L 17  16  15        Latest Ref Rng & Units 03/29/2024    7:35 AM 03/27/2024    6:48 AM 03/26/2024    4:09 AM  CBC  WBC 4.0 - 10.5 K/uL 6.4  10.3  13.3   Hemoglobin 13.0 - 17.0 g/dL 47.4  25.9  56.3   Hematocrit 39.0 - 52.0 % 41.1  40.7  41.2   Platelets 150 - 400 K/uL 224  243  260     RADS: N/a Assessment:   s/p robotic assisted laparoscopic loop 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  Patient continues to have ileostomy output recorded along with some NG output.  Abdomen has some distention but not overtly concerning.  With a couple days now of persistent ileostomy output, will try clamp trial for the day to see how he tolerates.  Management of chronic issues per hospitalist.  labs/images/medications/previous chart entries reviewed personally and relevant changes/updates noted above.

## 2024-03-30 NOTE — Plan of Care (Signed)

## 2024-03-30 NOTE — Progress Notes (Signed)
 PHARMACY - TOTAL PARENTERAL NUTRITION CONSULT NOTE   Indication: Prolonged illeus  Patient Measurements: Height: 5\' 9"  (175.3 cm) Weight: 79.7 kg (175 lb 9.6 oz) IBW/kg (Calculated) : 70.7 TPN AdjBW (KG): 77.3 Body mass index is 25.93 kg/m.  Assessment: 83 y.o. 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   Glucose / Insulin:  BG 135-193 7u SSI last 24h A1C 6.5 Electrolytes: WNL Renal: SCr ~ 1.  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 Cl:Ac 1:1 Add standard MVI and trace elements to TPN Thiamine 100 mg IV daily x 7 days per dietary Initiate Sensitive q6h SSI and adjust as needed  Monitor TPN labs on Mon/Thurs, and daily until stable  Scotty Cyphers, PharmD Clinical Pharmacist 03/30/2024

## 2024-03-30 NOTE — Progress Notes (Signed)
 Progress Note    Todd Rojas  UJW:[id number] DOB: 07-24-1941  DOA: 03/09/2024 PCP: Antonio Baumgarten, MD      Brief Narrative:    Medical records reviewed and are as summarized below:  Todd Rojas is a 83 y.o. male  with history of CKD stage IIIa, chronic gout, hypertension, hyperlipidemia, interstitial pulmonary disease, bronchiectasis, COPD, who presents emergency department for chief concerns of fever, chills, shortness of breath for 2 weeks.   Vitals in the ED showed temperature of 98.4, respiration rate 25, heart rate 94, blood pressure initially 96/69 and improved to 107/66, SpO2 initially desatted to 88% on room air   Status post exploratory laparotomy with robotic assisted laparoscopic loop ileostomy and decompression of cecum on 4/2.  Tolerated procedure well.  No ischemic gut noted   4/7: Worsening nausea.  Inability to tolerate p.o.  Repeat KUB demonstrating ileus.  General surgery reengaged.  Ordering CT abdomen.   4/8: Conservative management per surgery.  No evidence of obstruction.  CT reassuring.  Diet scaled back to n.p.o. for now.  Clear liquids later today if stable.      Assessment/Plan:   Principal Problem:   Severe sepsis with acute organ dysfunction (HCC) Active Problems:   Fever   Acute hypoxic respiratory failure (HCC)   CAD (coronary artery disease)   Elevated troponin   Gaseous abdominal distention   COPD (chronic obstructive pulmonary disease) (HCC)   Elevated serum creatinine   Hypertension   Hyperlipidemia, unspecified   Primary osteoarthritis of both knees   Thyroid nodule greater than or equal to 1.5 cm in diameter incidentally noted on imaging study   Dyslipidemia   Personal history of gout   Ogilvie syndrome   Protein-calorie malnutrition, severe   Nutrition Problem: Severe Malnutrition Etiology: acute illness  Signs/Symptoms: mild fat depletion, moderate fat depletion, mild muscle depletion, moderate  muscle depletion   Body mass index is 25.93 kg/m.   Gaseous abdominal distention 2/2 Colonic ileus  Ogilvie's syndrome Patient developed abdominal distention and crampy abdominal pain after presentation. Starting 3/25, Imaging with right colonic gaseous distention without any obvious obstruction.  Colonic distention has been worsening. --GenSurg and GI consulted, dx with Ogilvie's syndrome --neostigmine 2 mg push morning of 3/29 with Dr. Auston Left and Dr. Corky Diener present with passing of gas and BM afterwards, however, colonic distention unchanged, so pt underwent endoscopic decompression on 3/30.  Unfortunately colonic gas re-accumulated. --started TPN on 3/31 Robotic assisted laparoscopic loop ileostomy with decompression of cecum on 03/20/2024 --Was clinically improving then had deterioration on 4/7.  He was made n.p.o. and was subsequently transitioned to clear liquid diet. He had abdominal pain and vomiting on 03/27/2024 so NGT was replaced. Started on TPN for nutrition on 03/27/2024.  Continue for now He has some output from ileostomy and NG tube. NG tube to remain in place with plan for clamping trial per general surgeon. Continue ambulation as able.  Bradycardia, recurrent Initially noted secondary to neostigmine as administration.  Heart rate was down to 20s.  Patient received atropine.  Heart rate improved.  On 4/3 noted to be bradycardic, asymptomatic with heart rate in the 40s to 50s.  Beta-blockade was held. Use IV metoprolol as needed    Severe sepsis with acute organ dysfunction (HCC) 2/2 PNA Elevated lactic acid of 2.6, increased respiration rate, with fever of 102 F at home, organ involvement are cardiac and pulmonary with hypoxic respiratory failure at 88% on room air, elevated high sensitive troponin  of 98 and elevated BNP --Imaging concerning for right lower lobe pneumonia, although not much upper respiratory symptoms.  Procalcitonin at 0.352. Respiratory panels were  negative. UA was not consistent with UTI Patient received broad-spectrum antibiotics Completed antibiotic course    Acute hypoxic respiratory failure (HCC) Resolved.  He is tolerating room air    Elevated troponin 2/2 demand ischemia First high sensitivity troponin was elevated at 98 >>101>175>226,  Suspect secondary to demand ischemia in setting of severe sepsis with acute hypoxic respiratory failure Echocardiogram with EF of 40% and regional wall motion abnormalities, discussed with cardiology and they think these abnormalities are chronic. Patient was also experiencing intermittent chest pain over the past couple of months which seems more exertional --cardio consulted, no plan for cardiac workup    CAD (coronary artery disease) With history of anterior STEMI in 2006, transferred to [facility] status post cardiac catheterization with PCI to mid LAD using Cypher stent; currently no longer on Plavix Continue aspirin and statin    COPD (chronic obstructive pulmonary disease) (HCC) Bronchodilators as needed   Elevated serum creatinine Creatinine is stable.  Does not meet acute kidney injury criteria, suspect prerenal in setting of nausea vomiting Serum creatinine baseline is 1.35-1.36/eGFR 52-55, consistent with CKD stage III A Monitor BMP.  Minimize use of nephrotoxins     Thyroid nodule greater than or equal to 1.5 cm in diameter incidentally noted on imaging study Incidental finding of a 2.7 cm thyroid nodule. -Thyroid ultrasound with solitary nodule.  Outpatient biopsy recommended --TSH wnl    Rash Developed morning of 3/28, all over chest, back and bilateral thighs.  Appeared to be allergic rxn.  Rash improved with prednisone.  Completed 5 days of prednisone 40 mg daily.   Type II DM, hyperglycemia:  NovoLog as needed for hyperglycemia.  Hemoglobin A1c 6.5    Comorbidities include hypertension, hyperlipidemia, osteoarthritis of both knees s/p bilateral knee arthroplasty  with mild right knee effusion, gout    Diet Order             Diet NPO time specified Except for: Sips with Meds, Ice Chips  Diet effective now                            Consultants: Arts development officer Cardiologist  Procedures: Flexible sigmoidoscopy on 03/17/2024 Robotic assisted laparoscopic loop ileostomy with decompression of cecum on 03/20/2024    Medications:    allopurinol  300 mg Per Tube Daily   amLODipine  5 mg Per Tube Daily   aspirin  81 mg Per Tube Daily   Chlorhexidine Gluconate Cloth  6 each Topical Daily   diclofenac Sodium  2 g Topical TID   enoxaparin (LOVENOX) injection  40 mg Subcutaneous Q24H   insulin aspart  0-9 Units Subcutaneous Q6H   metoprolol tartrate  2.5 mg Intravenous Q8H   mometasone-formoterol  2 puff Inhalation BID   multivitamin with minerals  1 tablet Per Tube Daily   pantoprazole (PROTONIX) IV  40 mg Intravenous Daily   simethicone  80 mg Per Tube QID   sodium chloride flush  10-40 mL Intracatheter Q12H   thiamine (VITAMIN B1) injection  100 mg Intravenous Daily   Continuous Infusions:  acetaminophen 1,000 mg (03/30/24 0454)   promethazine (PHENERGAN) injection (IM or IVPB) Stopped (03/29/24 0315)   TPN ADULT (ION) 80 mL/hr at 03/29/24 1903     Anti-infectives (From admission, onward)    Start  Dose/Rate Route Frequency Ordered Stop   03/20/24 1315  cefoTEtan (CEFOTAN) 2 g in sodium chloride 0.9 % 100 mL IVPB  Status:  Discontinued        2 g 200 mL/hr over 30 Minutes Intravenous  Once 03/20/24 1228 03/24/24 0924   03/20/24 0830  cefoTEtan (CEFOTAN) 2 g in sodium chloride 0.9 % 100 mL IVPB        2 g 200 mL/hr over 30 Minutes Intravenous  Once 03/19/24 1739 03/20/24 0825   03/13/24 1200  levofloxacin (LEVAQUIN) tablet 750 mg        750 mg Oral Daily 03/13/24 1148 03/15/24 1413   03/10/24 1600  vancomycin (VANCOREADY) IVPB 1250 mg/250 mL  Status:  Discontinued        1,250 mg 166.7 mL/hr over  90 Minutes Intravenous Every 24 hours 03/10/24 1056 03/10/24 1250   03/10/24 0945  azithromycin (ZITHROMAX) 500 mg in sodium chloride 0.9 % 250 mL IVPB        500 mg 250 mL/hr over 60 Minutes Intravenous Every 24 hours 03/10/24 0934 03/12/24 1024   03/10/24 0200  metroNIDAZOLE (FLAGYL) IVPB 500 mg  Status:  Discontinued        500 mg 100 mL/hr over 60 Minutes Intravenous Every 12 hours 03/09/24 1515 03/10/24 0933   03/10/24 0000  ceFEPIme (MAXIPIME) 2 g in sodium chloride 0.9 % 100 mL IVPB  Status:  Discontinued        2 g 200 mL/hr over 30 Minutes Intravenous Every 12 hours 03/09/24 1541 03/13/24 1148   03/09/24 1600  vancomycin (VANCOCIN) IVPB 1000 mg/200 mL premix        1,000 mg 200 mL/hr over 60 Minutes Intravenous  Once 03/09/24 1537 03/09/24 1834   03/09/24 1537  vancomycin variable dose per unstable renal function (pharmacist dosing)  Status:  Discontinued         Does not apply See admin instructions 03/09/24 1537 03/10/24 1056   03/09/24 1515  metroNIDAZOLE (FLAGYL) IVPB 500 mg  Status:  Discontinued        500 mg 100 mL/hr over 60 Minutes Intravenous Every 12 hours 03/09/24 1504 03/09/24 1515   03/09/24 1200  ceFEPIme (MAXIPIME) 2 g in sodium chloride 0.9 % 100 mL IVPB        2 g 200 mL/hr over 30 Minutes Intravenous  Once 03/09/24 1152 03/09/24 1304   03/09/24 1200  metroNIDAZOLE (FLAGYL) IVPB 500 mg        500 mg 100 mL/hr over 60 Minutes Intravenous  Once 03/09/24 1152 03/09/24 1505   03/09/24 1200  vancomycin (VANCOCIN) IVPB 1000 mg/200 mL premix        1,000 mg 200 mL/hr over 60 Minutes Intravenous  Once 03/09/24 1152 03/09/24 1523              Family Communication/Anticipated D/C date and plan/Code Status   DVT prophylaxis: enoxaparin (LOVENOX) injection 40 mg Start: 03/26/24 2200 Place TED hose Start: 03/09/24 1502     Code Status: Full Code  Family Communication: Plan discussed with Lamond Pilot, wife, at the bedside bedside.  All her questions were  answered. Disposition Plan: Plan to discharge to SNF   Status is: Inpatient Remains inpatient appropriate because: Persistent ileus       Subjective:   Interval events noted.  He complains of sore throat from NG tube.  No abdominal pain.  His wife was at the bedside.   Objective:    Vitals:   03/30/24 0349 03/30/24  0402 03/30/24 0803 03/30/24 0853  BP:  (!) 137/53  133/64  Pulse:  (!) 52  97  Resp:  20  18  Temp:  98.4 F (36.9 C) 97.8 F (36.6 C) 97.9 F (36.6 C)  TempSrc:  Oral Oral   SpO2:  91%  93%  Weight: 79.7 kg     Height:       No data found.   Intake/Output Summary (Last 24 hours) at 03/30/2024 0919 Last data filed at 03/30/2024 0750 Gross per 24 hour  Intake 0 ml  Output 1350 ml  Net -1350 ml   Filed Weights   03/28/24 0323 03/29/24 0500 03/30/24 0349  Weight: 81.4 kg 80.4 kg 79.7 kg    Exam:  GEN: NAD SKIN: Warm and dry EYES: No pallor or icterus ENT: MMM, NG tube in place CV: RRR PULM: CTA B ABD: soft, ND, NT, +BS CNS: AAO x 3, non focal EXT: No edema or tenderness       Data Reviewed:   I have personally reviewed following labs and imaging studies:  Labs: Labs show the following:   Basic Metabolic Panel: Recent Labs  Lab 03/25/24 0847 03/26/24 0409 03/27/24 0648 03/28/24 0843 03/29/24 0735  NA 135 137 138 142 142  K 5.3* 5.0 4.1 4.1 4.4  CL 100 102 106 109 111  CO2 27 27 25 24 23   GLUCOSE 127* 107* 348* 135* 156*  BUN 45* 49* 39* 36* 34*  CREATININE 1.42* 1.46* 1.32* 1.01 0.95  CALCIUM 8.9 8.9 8.6* 8.8* 9.1  MG 2.6* 2.7* 2.3 2.4 2.3  PHOS 5.1* 5.4* 3.9 3.3 3.2   GFR Estimated Creatinine Clearance: 58.9 mL/min (by C-G formula based on SCr of 0.95 mg/dL). Liver Function Tests: Recent Labs  Lab 03/25/24 0847 03/26/24 0409 03/28/24 0843 03/29/24 0735  AST 25 22 14* 14*  ALT 26 25 15 16   ALKPHOS 57 63 47 49  BILITOT 0.8 1.1 0.8 0.7  PROT 6.7 6.6 6.5 6.8  ALBUMIN 2.7* 2.7* 3.3* 3.3*   No results for  input(s): "LIPASE", "AMYLASE" in the last 168 hours. No results for input(s): "AMMONIA" in the last 168 hours. Coagulation profile No results for input(s): "INR", "PROTIME" in the last 168 hours.  CBC: Recent Labs  Lab 03/25/24 0417 03/26/24 0409 03/27/24 0648 03/29/24 0735  WBC 18.1* 13.3* 10.3 6.4  HGB 14.4 13.6 13.2 13.2  HCT 42.5 41.2 40.7 41.1  MCV 96.4 98.6 99.5 101.5*  PLT 257 260 243 224   Cardiac Enzymes: No results for input(s): "CKTOTAL", "CKMB", "CKMBINDEX", "TROPONINI" in the last 168 hours. BNP (last 3 results) No results for input(s): "PROBNP" in the last 8760 hours. CBG: Recent Labs  Lab 03/29/24 1120 03/29/24 1424 03/29/24 1854 03/29/24 2331 03/30/24 0506  GLUCAP 193* 178* 153* 162* 135*   D-Dimer: No results for input(s): "DDIMER" in the last 72 hours. Hgb A1c: Recent Labs    03/28/24 0648  HGBA1C 6.5*   Lipid Profile: Recent Labs    03/28/24 0843  TRIG 96   Thyroid function studies: No results for input(s): "TSH", "T4TOTAL", "T3FREE", "THYROIDAB" in the last 72 hours.  Invalid input(s): "FREET3" Anemia work up: No results for input(s): "VITAMINB12", "FOLATE", "FERRITIN", "TIBC", "IRON", "RETICCTPCT" in the last 72 hours. Sepsis Labs: Recent Labs  Lab 03/25/24 0417 03/26/24 0409 03/27/24 0648 03/29/24 0735  WBC 18.1* 13.3* 10.3 6.4    Microbiology No results found for this or any previous visit (from the past 240 hours).  Procedures  and diagnostic studies:  DG ABD ACUTE 2+V W 1V CHEST Result Date: 03/30/2024 CLINICAL DATA:  [id number] Ileus following gastrointestinal surgery (HCC) [id number] EXAM: DG ABDOMEN ACUTE WITH 1 VIEW CHEST COMPARISON:  the previous day's study FINDINGS: Improving bibasilar airspace opacities.  Right arm PICC stable. Gastric tube to the proximal duodenum, the stomach decompressed. There are few mildly dilated mid abdominal small bowel loops with fluid levels, decreased slightly in number and degree of dilatation  since previous study. The colon remains decompressed. No free air. Mild lumbar spondylitic changes. IMPRESSION: 1. Improving bibasilar airspace disease. 2. Slightly improved small bowel dilatation. Electronically Signed   By: Nicoletta Barrier M.D.   On: 03/30/2024 08:46   DG ABD ACUTE 2+V W 1V CHEST Result Date: 03/29/2024 CLINICAL DATA:  [id number] Ileus following gastrointestinal surgery (HCC) [id number]. EXAM: DG ABDOMEN ACUTE WITH 1 VIEW CHEST COMPARISON:  03/28/2024. FINDINGS: There is gaseous dilatation of small bowel loops, which is disproportionate to the degree of distention of the colon, which may represent small bowel obstruction versus adynamic ileus. There is mild interval increase in the degree of dilation of bowel loops when compared to the recent prior exam. Small amount of stool noted in the ascending colon. Rest of the colon is collapsed. The No evidence of pneumoperitoneum. Low lung volume. Bilateral lung fields are clear. Bilateral costophrenic angles are clear. Normal cardio-mediastinal silhouette. No acute osseous abnormalities. The soft tissues are within normal limits. Surgical changes, devices, tubes and lines: Enteric tube seen with its tip and side hole overlying the right upper quadrant, overlying the pylorus/duodenal bulb region. IMPRESSION: 1. Mild interval increase in the degree of dilation of small bowel loops, which may represent worsening small bowel obstruction versus adynamic ileus. Correlate clinically. 2. No active cardiopulmonary disease. Electronically Signed   By: Beula Brunswick M.D.   On: 03/29/2024 08:35               LOS: 21 days   Shekela Goodridge  [facility]   Pager on [email or web address]. If 7PM-7AM, please contact night-coverage at [email or web address]     03/30/2024, 9:19 AM

## 2024-03-31 DIAGNOSIS — R6520 Severe sepsis without septic shock: Secondary | ICD-10-CM | POA: Diagnosis not present

## 2024-03-31 DIAGNOSIS — A419 Sepsis, unspecified organism: Secondary | ICD-10-CM | POA: Diagnosis not present

## 2024-03-31 LAB — GLUCOSE, CAPILLARY
Glucose-Capillary: 138 mg/dL — ABNORMAL HIGH (ref 70–99)
Glucose-Capillary: 138 mg/dL — ABNORMAL HIGH (ref 70–99)
Glucose-Capillary: 138 mg/dL — ABNORMAL HIGH (ref 70–99)
Glucose-Capillary: 145 mg/dL — ABNORMAL HIGH (ref 70–99)

## 2024-03-31 MED ORDER — SIMETHICONE 80 MG PO CHEW
80.0000 mg | CHEWABLE_TABLET | Freq: Four times a day (QID) | ORAL | Status: DC
  Administered 2024-03-31: 80 mg via ORAL
  Filled 2024-03-31 (×4): qty 1

## 2024-03-31 MED ORDER — DICYCLOMINE HCL 10 MG PO CAPS: 10.0000 mg | ORAL_CAPSULE | Freq: Three times a day (TID) | ORAL | Status: DC | PRN

## 2024-03-31 MED ORDER — AMLODIPINE BESYLATE 5 MG PO TABS
5.0000 mg | ORAL_TABLET | Freq: Every day | ORAL | Status: DC
  Administered 2024-03-31 – 2024-04-01 (×2): 5 mg via ORAL
  Filled 2024-03-31 (×3): qty 1

## 2024-03-31 MED ORDER — ALLOPURINOL 100 MG PO TABS
300.0000 mg | ORAL_TABLET | Freq: Every day | ORAL | Status: DC
  Administered 2024-03-31 – 2024-04-01 (×2): 300 mg via ORAL
  Filled 2024-03-31 (×3): qty 3

## 2024-03-31 MED ORDER — ADULT MULTIVITAMIN W/MINERALS CH
1.0000 | ORAL_TABLET | Freq: Every day | ORAL | Status: DC
  Administered 2024-03-31 – 2024-04-01 (×2): 1 via ORAL
  Filled 2024-03-31 (×3): qty 1

## 2024-03-31 MED ORDER — TRAZODONE HCL 50 MG PO TABS
50.0000 mg | ORAL_TABLET | Freq: Every evening | ORAL | Status: DC | PRN
  Administered 2024-03-31: 50 mg via ORAL
  Filled 2024-03-31: qty 1

## 2024-03-31 MED ORDER — TRAVASOL 10 % IV SOLN
INTRAVENOUS | Status: AC
  Filled 2024-03-31: qty 1056

## 2024-03-31 NOTE — Progress Notes (Addendum)
 PHARMACY - TOTAL PARENTERAL NUTRITION CONSULT NOTE   Indication: Prolonged illeus  Patient Measurements: Height: 5\' 9"  (175.3 cm) Weight: 81.6 kg (180 lb) IBW/kg (Calculated) : 70.7 TPN AdjBW (KG): 77.3 Body mass index is 26.58 kg/m.  Assessment: 83 y.o. 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   Glucose / Insulin:  BG 134-156 5u SSI last 24h A1C 6.5 Electrolytes: WNL Renal: SCr ~ 1.  Hepatic: LFTs within normal limits Intake / Output;   Intake/Output Summary (Last 24 hours) at 03/31/2024 1610 Last data filed at 03/31/2024 0441 Gross per 24 hour  Intake 0 ml  Output 775 ml  Net -775 ml    MIVF: N/A GI Imaging:Serial KUB prn 4/7: abd CT: postoperative ileus  4/10: abd XR: Persistent small-bowel dilatation  GI Surgeries / Procedures:  4/2-s/p surgery-robotic assisted laparoscopic loop ileostomy with decompression of cecum for Ogilvie Syndrome,    Central access: 03/18/24 TPN start date: 03/18/24>>4/5, resumed 4/9  Nutritional Goals: Goal TPN rate is 80 mL/hr (provides 105.6 g of protein and 2042.9 kcals per day)  RD Assessment: Estimated Needs Total Energy Estimated Needs: 1900-2200kcal/day Total Protein Estimated Needs: 95-110g/day Total Fluid Estimated Needs: 1.9-2.2L/day  Current Nutrition:  Start CLD 4/13, NG tube removed  Plan:  Continue custom TPN at 80 mL/hr at 1800 Electrolytes in TPN: Na 50mEq/L, K 56mEq/L, Ca 42mEq/L, Mg 32mEq/L, and Phos 15mmol/L. Cl:Ac 1:1 Add standard MVI and trace elements to TPN Thiamine 100 mg IV daily x 7 days per dietary Continue Sensitive q6h SSI and adjust as needed  Monitor TPN labs on Mon/Thurs, and daily until stable  Scotty Cyphers, PharmD Clinical Pharmacist 03/31/2024

## 2024-03-31 NOTE — Progress Notes (Signed)
 Subjective:  CC: Todd Rojas is a 83 y.o. male  Hospital stay day 22, 11 Days Post-Op s/p robotic assisted laparoscopic loop ileostomy with decompression of cecum for Ogilvie Syndrome, complicated by pertinent comorbidities including COPD, asthma, CAD, history of MI, CHF, HTN, HLD.   HPI: No acute complaints overnight.  Output noted again in ileostomy.  No complaints of nausea vomiting with NG clamped all day yesterday  ROS:  General: Denies weight loss, weight gain, fatigue, fevers, chills, and night sweats. Heart: Denies chest pain, palpitations, racing heart, irregular heartbeat, leg pain or swelling, and decreased activity tolerance. Respiratory: Denies breathing difficulty, shortness of breath, wheezing, cough, and sputum. GI: Denies change in appetite, heartburn, nausea, vomiting, constipation, diarrhea, and blood in stool. GU: Denies difficulty urinating, pain with urinating, urgency, frequency, blood in urine.   Objective:   Temp:  [97.9 F (36.6 C)-98.5 F (36.9 C)] 98.5 F (36.9 C) (04/13 0858) Pulse Rate:  [64-70] 70 (04/13 0858) Resp:  [16-20] 17 (04/13 0858) BP: (125-133)/(58-64) 127/64 (04/13 0858) SpO2:  [94 %-96 %] 94 % (04/13 0858) Weight:  [81.6 kg] 81.6 kg (04/13 0441)     Height: 5\' 9"  (175.3 cm) Weight: 81.6 kg BMI (Calculated): 26.57   Intake/Output this shift:   Intake/Output Summary (Last 24 hours) at 03/31/2024 1037 Last data filed at 03/31/2024 0441 Gross per 24 hour  Intake 0 ml  Output 775 ml  Net -775 ml    Constitutional :  alert, cooperative, appears stated age, and no distress  Respiratory:  clear to auscultation bilaterally  Cardiovascular:  regular rate and rhythm  Gastrointestinal: Soft, no guarding, no tenderness to palpation.  Slight distention with tympany still noted.  NG with bilious output .   Skin: Cool and moist.  Incision sites clean dry and intact  Psychiatric: Normal affect, non-agitated, not confused       LABS:      Latest Ref Rng & Units 03/30/2024   12:32 PM 03/29/2024    7:35 AM 03/28/2024    8:43 AM  CMP  Glucose 70 - 99 mg/dL 811  914  782   BUN 8 - 23 mg/dL 38  34  36   Creatinine 0.61 - 1.24 mg/dL 9.56  2.13  0.86   Sodium 135 - 145 mmol/L 142  142  142   Potassium 3.5 - 5.1 mmol/L 4.9  4.4  4.1   Chloride 98 - 111 mmol/L 113  111  109   CO2 22 - 32 mmol/L 20  23  24    Calcium 8.9 - 10.3 mg/dL 9.3  9.1  8.8   Total Protein 6.5 - 8.1 g/dL 7.1  6.8  6.5   Total Bilirubin 0.0 - 1.2 mg/dL 0.8  0.7  0.8   Alkaline Phos 38 - 126 U/L 58  49  47   AST 15 - 41 U/L 15  14  14    ALT 0 - 44 U/L 17  16  15        Latest Ref Rng & Units 03/29/2024    7:35 AM 03/27/2024    6:48 AM 03/26/2024    4:09 AM  CBC  WBC 4.0 - 10.5 K/uL 6.4  10.3  13.3   Hemoglobin 13.0 - 17.0 g/dL 57.8  46.9  62.9   Hematocrit 39.0 - 52.0 % 41.1  40.7  41.2   Platelets 150 - 400 K/uL 224  243  260     RADS: N/a Assessment:   s/p robotic  assisted laparoscopic loop ileostomy with decompression of cecum for Ogilvie Syndrome, complicated by pertinent comorbidities including COPD, asthma, CAD, history of MI, CHF, HTN, HLD.  Patient continues to have ileostomy output.  NG placed back on suction this morning with scant output.  Will remove and start clear liquid diet and continue to monitor.  Abdominal distention is still present on exam so we will have to closely monitor.  He is still on scheduled simethicone.    Management of chronic issues per hospitalist.  labs/images/medications/previous chart entries reviewed personally and relevant changes/updates noted above.

## 2024-03-31 NOTE — Progress Notes (Signed)
 Pt has cherry flavored gas ex at bedside. When discussed medication given patient and given simethicone family stated pt had already been given a cherry flavored; given by daughter Benn Brash provided by wife. Daughters and wife stated has been given pt gas relief tablets from home 125mg  cherry possibly half to whole tablet but inconsistency in stories on how long they have been given it to the patient. When educating family risk associated with giving medications along with hospital given patient medicine family responses wasn't reassuring they were receptive of the risk of overdosage and will not give any medications to pt. Response to education was the patient needs to pass gas and they want him home by next week. The patient don't take what the hospital giving him at times so he needs and they are going to give it to him if he needs it and it helps him go home. Discussed with family having too much is the system can cause problems and impact patients health.  Doctors unable to monitor intake if family is medicating along with nursing.  Additionally discussed the patterns were a  safety concerns and family can been denied visitations during pt hospitalization due to medicating the patient during the hospitalization. The family did admit that the patient isn't necessarily more compliant with the cherry flavor tablet simethicone. Pt stated he wants the gummy Tums that he take at home. I offered getting MD and pharmacy approval of the gas relief tablet cherry flavor Simethicone 125mg  1/2 tablet provided by family with the understanding it is kept in staff possession and only nursing to give. Encouraged family to not give patient any medications any medications desire patient to have bring to nursing or the MD for approval and to be administered by nursing staff only. MD [facility] notified of the above.

## 2024-03-31 NOTE — Progress Notes (Signed)
 Progress Note    Todd Rojas  ZOX:[id number] DOB: [birth year]  DOA: 03/09/2024 PCP: Antonio Baumgarten, MD      Brief Narrative:    Medical records reviewed and are as summarized below:  Todd Rojas is a 83 y.o. male  with history of CKD stage IIIa, chronic gout, hypertension, hyperlipidemia, interstitial pulmonary disease, bronchiectasis, COPD, who presents emergency department for chief concerns of fever, chills, shortness of breath for 2 weeks.   Vitals in the ED showed temperature of 98.4, respiration rate 25, heart rate 94, blood pressure initially 96/69 and improved to 107/66, SpO2 initially desatted to 88% on room air   Status post exploratory laparotomy with robotic assisted laparoscopic loop ileostomy and decompression of cecum on 4/2.  Tolerated procedure well.  No ischemic gut noted   4/7: Worsening nausea.  Inability to tolerate p.o.  Repeat KUB demonstrating ileus.  General surgery reengaged.  Ordering CT abdomen.   4/8: Conservative management per surgery.  No evidence of obstruction.  CT reassuring.  Diet scaled back to n.p.o. for now.  Clear liquids later today if stable.      Assessment/Plan:   Principal Problem:   Severe sepsis with acute organ dysfunction (HCC) Active Problems:   Fever   Acute hypoxic respiratory failure (HCC)   CAD (coronary artery disease)   Elevated troponin   Gaseous abdominal distention   COPD (chronic obstructive pulmonary disease) (HCC)   Elevated serum creatinine   Hypertension   Hyperlipidemia, unspecified   Primary osteoarthritis of both knees   Thyroid nodule greater than or equal to 1.5 cm in diameter incidentally noted on imaging study   Dyslipidemia   Personal history of gout   Ogilvie syndrome   Protein-calorie malnutrition, severe   Nutrition Problem: Severe Malnutrition Etiology: acute illness  Signs/Symptoms: mild fat depletion, moderate fat depletion, mild muscle depletion, moderate  muscle depletion   Body mass index is 26.58 kg/m.   Gaseous abdominal distention 2/2 Colonic ileus  Ogilvie's syndrome Patient developed abdominal distention and crampy abdominal pain after presentation. Starting 3/25, Imaging with right colonic gaseous distention without any obvious obstruction.  Colonic distention has been worsening. --GenSurg and GI consulted, dx with Ogilvie's syndrome --neostigmine 2 mg push morning of 3/29 with Dr. Auston Left and Dr. Corky Diener present with passing of gas and BM afterwards, however, colonic distention unchanged, so pt underwent endoscopic decompression on 3/30.  Unfortunately colonic gas re-accumulated. --started TPN on 3/31 Robotic assisted laparoscopic loop ileostomy with decompression of cecum on 03/20/2024 --Was clinically improving then had deterioration on 4/7.  He was made n.p.o. and was subsequently transitioned to clear liquid diet. He had abdominal pain and vomiting on 03/27/2024 so NGT was replaced. NG tube successfully removed on 03/31/2024 Started on TPN for nutrition on 03/27/2024.  Continue for now Continue ambulation as tolerated   Bradycardia, recurrent Initially noted secondary to neostigmine as administration.  Heart rate was down to 20s.  Patient received atropine.  Heart rate improved.  On 4/3 noted to be bradycardic, asymptomatic with heart rate in the 40s to 50s.  Beta-blockade was held. Use IV metoprolol as needed    Severe sepsis with acute organ dysfunction (HCC) 2/2 PNA Elevated lactic acid of 2.6, increased respiration rate, with fever of 102 F at home, organ involvement are cardiac and pulmonary with hypoxic respiratory failure at 88% on room air, elevated high sensitive troponin of 98 and elevated BNP --Imaging concerning for right lower lobe pneumonia, although not much upper  respiratory symptoms.  Procalcitonin at 0.352. Respiratory panels were negative. UA was not consistent with UTI Patient received broad-spectrum  antibiotics Completed antibiotic course    Acute hypoxic respiratory failure (HCC) Resolved.  He is tolerating room air    Elevated troponin 2/2 demand ischemia First high sensitivity troponin was elevated at 98 >>101>175>226,  Suspect secondary to demand ischemia in setting of severe sepsis with acute hypoxic respiratory failure Echocardiogram with EF of 40% and regional wall motion abnormalities, discussed with cardiology and they think these abnormalities are chronic. Patient was also experiencing intermittent chest pain over the past couple of months which seems more exertional --cardio consulted, no plan for cardiac workup    CAD (coronary artery disease) With history of anterior STEMI in 2006, transferred to [facility] status post cardiac catheterization with PCI to mid LAD using Cypher stent; currently no longer on Plavix Continue aspirin and statin    COPD (chronic obstructive pulmonary disease) (HCC) Bronchodilators as needed   Elevated serum creatinine Creatinine is stable.  Does not meet acute kidney injury criteria, suspect prerenal in setting of nausea vomiting Serum creatinine baseline is 1.35-1.36/eGFR 52-55, consistent with CKD stage III A Monitor BMP.  Minimize use of nephrotoxins     Thyroid nodule greater than or equal to 1.5 cm in diameter incidentally noted on imaging study Incidental finding of a 2.7 cm thyroid nodule. -Thyroid ultrasound with solitary nodule.  Outpatient biopsy recommended --TSH wnl    Rash Developed morning of 3/28, all over chest, back and bilateral thighs.  Appeared to be allergic rxn.  Rash improved with prednisone.  Completed 5 days of prednisone 40 mg daily.   Type II DM, hyperglycemia:  NovoLog as needed for hyperglycemia.  Hemoglobin A1c 6.5    Comorbidities include hypertension, hyperlipidemia, osteoarthritis of both knees s/p bilateral knee arthroplasty with mild right knee effusion, gout    Diet Order             Diet  clear liquid Room service appropriate? Yes; Fluid consistency: Thin  Diet effective now                            Consultants: General surgeon Gastroenterologist Cardiologist  Procedures: Flexible sigmoidoscopy on 03/17/2024 Robotic assisted laparoscopic loop ileostomy with decompression of cecum on 03/20/2024    Medications:    allopurinol  300 mg Oral Daily   amLODipine  5 mg Oral Daily   aspirin  81 mg Per Tube Daily   Chlorhexidine Gluconate Cloth  6 each Topical Daily   diclofenac Sodium  2 g Topical TID   enoxaparin (LOVENOX) injection  40 mg Subcutaneous Q24H   insulin aspart  0-9 Units Subcutaneous Q6H   mometasone-formoterol  2 puff Inhalation BID   multivitamin with minerals  1 tablet Oral Daily   pantoprazole (PROTONIX) IV  40 mg Intravenous Daily   simethicone  80 mg Oral QID   sodium chloride flush  10-40 mL Intracatheter Q12H   thiamine (VITAMIN B1) injection  100 mg Intravenous Daily   Continuous Infusions:  promethazine (PHENERGAN) injection (IM or IVPB) Stopped (03/29/24 0315)   TPN ADULT (ION) 80 mL/hr at 03/30/24 1914   TPN ADULT (ION)       Anti-infectives (From admission, onward)    Start     Dose/Rate Route Frequency Ordered Stop   03/20/24 1315  cefoTEtan (CEFOTAN) 2 g in sodium chloride 0.9 % 100 mL IVPB  Status:  Discontinued  2 g 200 mL/hr over 30 Minutes Intravenous  Once 03/20/24 1228 03/24/24 0924   03/20/24 0830  cefoTEtan (CEFOTAN) 2 g in sodium chloride 0.9 % 100 mL IVPB        2 g 200 mL/hr over 30 Minutes Intravenous  Once 03/19/24 1739 03/20/24 0825   03/13/24 1200  levofloxacin (LEVAQUIN) tablet 750 mg        750 mg Oral Daily 03/13/24 1148 03/15/24 1413   03/10/24 1600  vancomycin (VANCOREADY) IVPB 1250 mg/250 mL  Status:  Discontinued        1,250 mg 166.7 mL/hr over 90 Minutes Intravenous Every 24 hours 03/10/24 1056 03/10/24 1250   03/10/24 0945  azithromycin (ZITHROMAX) 500 mg in sodium chloride 0.9 % 250  mL IVPB        500 mg 250 mL/hr over 60 Minutes Intravenous Every 24 hours 03/10/24 0934 03/12/24 1024   03/10/24 0200  metroNIDAZOLE (FLAGYL) IVPB 500 mg  Status:  Discontinued        500 mg 100 mL/hr over 60 Minutes Intravenous Every 12 hours 03/09/24 1515 03/10/24 0933   03/10/24 0000  ceFEPIme (MAXIPIME) 2 g in sodium chloride 0.9 % 100 mL IVPB  Status:  Discontinued        2 g 200 mL/hr over 30 Minutes Intravenous Every 12 hours 03/09/24 1541 03/13/24 1148   03/09/24 1600  vancomycin (VANCOCIN) IVPB 1000 mg/200 mL premix        1,000 mg 200 mL/hr over 60 Minutes Intravenous  Once 03/09/24 1537 03/09/24 1834   03/09/24 1537  vancomycin variable dose per unstable renal function (pharmacist dosing)  Status:  Discontinued         Does not apply See admin instructions 03/09/24 1537 03/10/24 1056   03/09/24 1515  metroNIDAZOLE (FLAGYL) IVPB 500 mg  Status:  Discontinued        500 mg 100 mL/hr over 60 Minutes Intravenous Every 12 hours 03/09/24 1504 03/09/24 1515   03/09/24 1200  ceFEPIme (MAXIPIME) 2 g in sodium chloride 0.9 % 100 mL IVPB        2 g 200 mL/hr over 30 Minutes Intravenous  Once 03/09/24 1152 03/09/24 1304   03/09/24 1200  metroNIDAZOLE (FLAGYL) IVPB 500 mg        500 mg 100 mL/hr over 60 Minutes Intravenous  Once 03/09/24 1152 03/09/24 1505   03/09/24 1200  vancomycin (VANCOCIN) IVPB 1000 mg/200 mL premix        1,000 mg 200 mL/hr over 60 Minutes Intravenous  Once 03/09/24 1152 03/09/24 1523              Family Communication/Anticipated D/C date and plan/Code Status   DVT prophylaxis: enoxaparin (LOVENOX) injection 40 mg Start: 03/26/24 2200 Place TED hose Start: 03/09/24 1502     Code Status: Full Code  Family Communication: Plan discussed with Lamond Pilot, wife, at the bedside bedside.  All her questions were answered. Disposition Plan: Plan to discharge to SNF   Status is: Inpatient Remains inpatient appropriate because: Persistent  ileus       Subjective:   Interval events noted.  No chest pain, shortness of breath abdominal pain or vomiting.  He is hoping NG tube can be removed today.  His wife was at the bedside.   Objective:    Vitals:   03/30/24 2012 03/31/24 0315 03/31/24 0441 03/31/24 0858  BP: 130/61 125/61  127/64  Pulse: 66 64  70  Resp: 16 20  17   Temp:  98.3 F (36.8 C) 97.9 F (36.6 C)  98.5 F (36.9 C)  TempSrc:      SpO2: 96% 94%  94%  Weight:   81.6 kg   Height:       No data found.   Intake/Output Summary (Last 24 hours) at 03/31/2024 1510 Last data filed at 03/31/2024 1230 Gross per 24 hour  Intake 0 ml  Output 875 ml  Net -875 ml   Filed Weights   03/29/24 0500 03/30/24 0349 03/31/24 0441  Weight: 80.4 kg 79.7 kg 81.6 kg    Exam:  GEN: NAD SKIN: Warm and dry EYES: No pallor or icterus ENT: MMM, clamped NG tube CV: RRR PULM: CTA B ABD: soft, ND, NT, +BS CNS: AAO x 3, non focal EXT: No edema or tenderness      Data Reviewed:   I have personally reviewed following labs and imaging studies:  Labs: Labs show the following:   Basic Metabolic Panel: Recent Labs  Lab 03/25/24 0847 03/26/24 0409 03/27/24 0648 03/28/24 0843 03/29/24 0735 03/30/24 1232  NA 135 137 138 142 142 142  K 5.3* 5.0 4.1 4.1 4.4 4.9  CL 100 102 106 109 111 113*  CO2 27 27 25 24 23  20*  GLUCOSE 127* 107* 348* 135* 156* 145*  BUN 45* 49* 39* 36* 34* 38*  CREATININE 1.42* 1.46* 1.32* 1.01 0.95 1.00  CALCIUM 8.9 8.9 8.6* 8.8* 9.1 9.3  MG 2.6* 2.7* 2.3 2.4 2.3  --   PHOS 5.1* 5.4* 3.9 3.3 3.2  --    GFR Estimated Creatinine Clearance: 56 mL/min (by C-G formula based on SCr of 1 mg/dL). Liver Function Tests: Recent Labs  Lab 03/25/24 0847 03/26/24 0409 03/28/24 0843 03/29/24 0735 03/30/24 1232  AST 25 22 14* 14* 15  ALT 26 25 15 16 17   ALKPHOS 57 63 47 49 58  BILITOT 0.8 1.1 0.8 0.7 0.8  PROT 6.7 6.6 6.5 6.8 7.1  ALBUMIN 2.7* 2.7* 3.3* 3.3* 3.3*   No results for  input(s): "LIPASE", "AMYLASE" in the last 168 hours. No results for input(s): "AMMONIA" in the last 168 hours. Coagulation profile No results for input(s): "INR", "PROTIME" in the last 168 hours.  CBC: Recent Labs  Lab 03/25/24 0417 03/26/24 0409 03/27/24 0648 03/29/24 0735  WBC 18.1* 13.3* 10.3 6.4  HGB 14.4 13.6 13.2 13.2  HCT 42.5 41.2 40.7 41.1  MCV 96.4 98.6 99.5 101.5*  PLT 257 260 243 224   Cardiac Enzymes: No results for input(s): "CKTOTAL", "CKMB", "CKMBINDEX", "TROPONINI" in the last 168 hours. BNP (last 3 results) No results for input(s): "PROBNP" in the last 8760 hours. CBG: Recent Labs  Lab 03/30/24 1758 03/30/24 2342 03/31/24 0514 03/31/24 0904 03/31/24 1155  GLUCAP 140* 134* 138* 138* 138*   D-Dimer: No results for input(s): "DDIMER" in the last 72 hours. Hgb A1c: No results for input(s): "HGBA1C" in the last 72 hours.  Lipid Profile: No results for input(s): "CHOL", "HDL", "LDLCALC", "TRIG", "CHOLHDL", "LDLDIRECT" in the last 72 hours.  Thyroid function studies: No results for input(s): "TSH", "T4TOTAL", "T3FREE", "THYROIDAB" in the last 72 hours.  Invalid input(s): "FREET3" Anemia work up: No results for input(s): "VITAMINB12", "FOLATE", "FERRITIN", "TIBC", "IRON", "RETICCTPCT" in the last 72 hours. Sepsis Labs: Recent Labs  Lab 03/25/24 0417 03/26/24 0409 03/27/24 0648 03/29/24 0735  WBC 18.1* 13.3* 10.3 6.4    Microbiology No results found for this or any previous visit (from the past 240 hours).  Procedures and diagnostic  studies:  DG ABD ACUTE 2+V W 1V CHEST Result Date: 03/30/2024 CLINICAL DATA:  [id number] Ileus following gastrointestinal surgery (HCC) [id number] EXAM: DG ABDOMEN ACUTE WITH 1 VIEW CHEST COMPARISON:  the previous day's study FINDINGS: Improving bibasilar airspace opacities.  Right arm PICC stable. Gastric tube to the proximal duodenum, the stomach decompressed. There are few mildly dilated mid abdominal small bowel loops  with fluid levels, decreased slightly in number and degree of dilatation since previous study. The colon remains decompressed. No free air. Mild lumbar spondylitic changes. IMPRESSION: 1. Improving bibasilar airspace disease. 2. Slightly improved small bowel dilatation. Electronically Signed   By: Nicoletta Barrier M.D.   On: 03/30/2024 08:46               LOS: 22 days   Skarleth Delmonico  [facility]   Pager on [email or web address]. If 7PM-7AM, please contact night-coverage at [email or web address]     03/31/2024, 3:10 PM

## 2024-03-31 NOTE — Plan of Care (Signed)

## 2024-03-31 NOTE — Plan of Care (Signed)

## 2024-03-31 NOTE — Progress Notes (Signed)
 Mobility Specialist - Progress Note   03/31/24 1013  Mobility  Activity Ambulated with assistance in hallway;Stood at bedside;Dangled on edge of bed  Level of Assistance Standby assist, set-up cues, supervision of patient - no hands on  Assistive Device Front wheel walker  Distance Ambulated (ft) 320 ft  Activity Response Tolerated well  Mobility Referral Yes  Mobility visit 1 Mobility  Mobility Specialist Start Time (ACUTE ONLY) 0901  Mobility Specialist Stop Time (ACUTE ONLY) [id number]  Mobility Specialist Time Calculation (min) (ACUTE ONLY) 23 min   Pt supine in bed on RA upon arrival. Pt completes bed mobility indep. Pt STS and ambulates in hallway SBA. Pt returns to bed with needs in reach and family present.   Wash Hack  Mobility Specialist  03/31/24 10:15 AM

## 2024-04-01 ENCOUNTER — Inpatient Hospital Stay

## 2024-04-01 DIAGNOSIS — A419 Sepsis, unspecified organism: Secondary | ICD-10-CM | POA: Diagnosis not present

## 2024-04-01 DIAGNOSIS — R6520 Severe sepsis without septic shock: Secondary | ICD-10-CM | POA: Diagnosis not present

## 2024-04-01 LAB — GLUCOSE, CAPILLARY
Glucose-Capillary: 136 mg/dL — ABNORMAL HIGH (ref 70–99)
Glucose-Capillary: 140 mg/dL — ABNORMAL HIGH (ref 70–99)
Glucose-Capillary: 143 mg/dL — ABNORMAL HIGH (ref 70–99)
Glucose-Capillary: 154 mg/dL — ABNORMAL HIGH (ref 70–99)
Glucose-Capillary: 165 mg/dL — ABNORMAL HIGH (ref 70–99)

## 2024-04-01 LAB — COMPREHENSIVE METABOLIC PANEL WITH GFR
ALT: 28 U/L (ref 0–44)
AST: 22 U/L (ref 15–41)
Albumin: 2.9 g/dL — ABNORMAL LOW (ref 3.5–5.0)
Alkaline Phosphatase: 74 U/L (ref 38–126)
Anion gap: 7 (ref 5–15)
BUN: 30 mg/dL — ABNORMAL HIGH (ref 8–23)
CO2: 22 mmol/L (ref 22–32)
Calcium: 9.1 mg/dL (ref 8.9–10.3)
Chloride: 108 mmol/L (ref 98–111)
Creatinine, Ser: 0.88 mg/dL (ref 0.61–1.24)
GFR, Estimated: >60 mL/min (ref >60)
Glucose, Bld: 147 mg/dL — ABNORMAL HIGH (ref 70–99)
Potassium: 4.8 mmol/L (ref 3.5–5.1)
Sodium: 137 mmol/L (ref 135–145)
Total Bilirubin: 0.5 mg/dL (ref 0.0–1.2)
Total Protein: 6.8 g/dL (ref 6.5–8.1)

## 2024-04-01 LAB — TRIGLYCERIDES: Triglycerides: 121 mg/dL (ref <150)

## 2024-04-01 LAB — PHOSPHORUS: Phosphorus: 3.3 mg/dL (ref 2.5–4.6)

## 2024-04-01 LAB — MAGNESIUM: Magnesium: 2.1 mg/dL (ref 1.7–2.4)

## 2024-04-01 MED ORDER — ACETAMINOPHEN 10 MG/ML IV SOLN
1000.0000 mg | Freq: Four times a day (QID) | INTRAVENOUS | Status: AC
  Administered 2024-04-01 – 2024-04-02 (×3): 1000 mg via INTRAVENOUS
  Filled 2024-04-01 (×4): qty 100

## 2024-04-01 MED ORDER — TRAVASOL 10 % IV SOLN
INTRAVENOUS | Status: AC
  Filled 2024-04-01: qty 1056

## 2024-04-01 MED ORDER — ACETAMINOPHEN 10 MG/ML IV SOLN
1000.0000 mg | Freq: Four times a day (QID) | INTRAVENOUS | Status: DC
  Filled 2024-04-01 (×2): qty 100

## 2024-04-01 MED ORDER — LACTATED RINGERS IV SOLN: INTRAVENOUS | Status: AC

## 2024-04-01 NOTE — Progress Notes (Signed)
 Progress Note    Todd Rojas  ZOX:[id number] DOB: [birth year]  DOA: 03/09/2024 PCP: Barbette Reichmann, MD      Brief Narrative:    Medical records reviewed and are as summarized below:  Todd Rojas is a 83 y.o. male  with history of CKD stage IIIa, chronic gout, hypertension, hyperlipidemia, interstitial pulmonary disease, bronchiectasis, COPD, who presents emergency department for chief concerns of fever, chills, shortness of breath for 2 weeks.   Vitals in the ED showed temperature of 98.4, respiration rate 25, heart rate 94, blood pressure initially 96/69 and improved to 107/66, SpO2 initially desatted to 88% on room air   Status post exploratory laparotomy with robotic assisted laparoscopic loop ileostomy and decompression of cecum on 4/2.  Tolerated procedure well.  No ischemic gut noted   4/7: Worsening nausea.  Inability to tolerate p.o.  Repeat KUB demonstrating ileus.  General surgery reengaged.  Ordering CT abdomen.   4/8: Conservative management per surgery.  No evidence of obstruction.  CT reassuring.  Diet scaled back to n.p.o. for now.  Clear liquids later today if stable.      Assessment/Plan:   Principal Problem:   Severe sepsis with acute organ dysfunction (HCC) Active Problems:   Fever   Acute hypoxic respiratory failure (HCC)   CAD (coronary artery disease)   Elevated troponin   Gaseous abdominal distention   COPD (chronic obstructive pulmonary disease) (HCC)   Elevated serum creatinine   Hypertension   Hyperlipidemia, unspecified   Primary osteoarthritis of both knees   Thyroid nodule greater than or equal to 1.5 cm in diameter incidentally noted on imaging study   Dyslipidemia   Personal history of gout   Ogilvie syndrome   Protein-calorie malnutrition, severe   Nutrition Problem: Severe Malnutrition Etiology: acute illness  Signs/Symptoms: mild fat depletion, moderate fat depletion, mild muscle depletion, moderate  muscle depletion   Body mass index is 26.49 kg/m.   Gaseous abdominal distention 2/2 Colonic ileus  Ogilvie's syndrome Patient developed abdominal distention and crampy abdominal pain after presentation. Starting 3/25, Imaging with right colonic gaseous distention without any obvious obstruction.  Colonic distention has been worsening. --GenSurg and GI consulted, dx with Ogilvie's syndrome --neostigmine 2 mg push morning of 3/29 with [name] and [name] present with passing of gas and BM afterwards, however, colonic distention unchanged, so pt underwent endoscopic decompression on 3/30.  Unfortunately colonic gas re-accumulated. --started TPN on 3/31 Robotic assisted laparoscopic loop ileostomy with decompression of cecum on 03/20/2024 --Was clinically improving then had deterioration on 4/7.  He was made n.p.o. and was subsequently transitioned to clear liquid diet. He had abdominal pain and vomiting on 03/27/2024 so NGT was replaced. NG tube successfully removed on 03/31/2024 He had recurrent nausea and vomiting on 04/01/2024.  Abdominal x-ray showed persistent gaseous distention so NG tube was reinserted on 04/01/2024. Started on TPN for nutrition on 03/27/2024.  Continue for now Start IV fluids for hydration because of dark urine. Continue ambulation as needed.  Follow-up with general surgeon   Bradycardia, recurrent Initially noted secondary to neostigmine as administration.  Heart rate was down to 20s.  Patient received atropine.  Heart rate improved.  On 4/3 noted to be bradycardic, asymptomatic with heart rate in the 40s to 50s.  Beta-blockade was held. Use IV metoprolol as needed    Severe sepsis with acute organ dysfunction (HCC) 2/2 PNA Elevated lactic acid of 2.6, increased respiration rate, with fever of 102 F at  home, organ involvement are cardiac and pulmonary with hypoxic respiratory failure at 88% on room air, elevated high sensitive troponin of 98 and elevated  BNP --Imaging concerning for right lower lobe pneumonia, although not much upper respiratory symptoms.  Procalcitonin at 0.352. Respiratory panels were negative. UA was not consistent with UTI Patient received broad-spectrum antibiotics Completed antibiotic course    Acute hypoxic respiratory failure (HCC) Resolved.  He is tolerating room air    Elevated troponin 2/2 demand ischemia First high sensitivity troponin was elevated at 98 >>101>175>226,  Suspect secondary to demand ischemia in setting of severe sepsis with acute hypoxic respiratory failure Echocardiogram with EF of 40% and regional wall motion abnormalities, discussed with cardiology and they think these abnormalities are chronic. Patient was also experiencing intermittent chest pain over the past couple of months which seems more exertional --cardio consulted, no plan for cardiac workup    CAD (coronary artery disease) With history of anterior STEMI in 2006, transferred to [facility] status post cardiac catheterization with PCI to mid LAD using Cypher stent; currently no longer on Plavix Continue aspirin and statin    COPD (chronic obstructive pulmonary disease) (HCC) Bronchodilators as needed   Elevated serum creatinine Creatinine is stable.  Does not meet acute kidney injury criteria, suspect prerenal in setting of nausea vomiting Serum creatinine baseline is 1.35-1.36/eGFR 52-55, consistent with CKD stage III A Monitor BMP.  Minimize use of nephrotoxins     Thyroid nodule greater than or equal to 1.5 cm in diameter incidentally noted on imaging study Incidental finding of a 2.7 cm thyroid nodule. -Thyroid ultrasound with solitary nodule.  Outpatient biopsy recommended --TSH wnl    Rash Developed morning of 3/28, all over chest, back and bilateral thighs.  Appeared to be allergic rxn.  Rash improved with prednisone.  Completed 5 days of prednisone 40 mg daily.   Type II DM, hyperglycemia:  NovoLog as needed for  hyperglycemia.  Hemoglobin A1c 6.5    Comorbidities include hypertension, hyperlipidemia, osteoarthritis of both knees s/p bilateral knee arthroplasty with mild right knee effusion, gout    Diet Order             Diet NPO time specified Except for: Ice Chips  Diet effective now                            Consultants: Arts development officer Cardiologist  Procedures: Flexible sigmoidoscopy on 03/17/2024 Robotic assisted laparoscopic loop ileostomy with decompression of cecum on 03/20/2024    Medications:    allopurinol  300 mg Oral Daily   amLODipine  5 mg Oral Daily   aspirin  81 mg Per Tube Daily   Chlorhexidine Gluconate Cloth  6 each Topical Daily   diclofenac Sodium  2 g Topical TID   enoxaparin (LOVENOX) injection  40 mg Subcutaneous Q24H   insulin aspart  0-9 Units Subcutaneous Q6H   mometasone-formoterol  2 puff Inhalation BID   multivitamin with minerals  1 tablet Oral Daily   pantoprazole (PROTONIX) IV  40 mg Intravenous Daily   simethicone  80 mg Oral QID   sodium chloride flush  10-40 mL Intracatheter Q12H   thiamine (VITAMIN B1) injection  100 mg Intravenous Daily   Continuous Infusions:  promethazine (PHENERGAN) injection (IM or IVPB) Stopped (03/29/24 0315)   TPN ADULT (ION) 80 mL/hr at 03/31/24 1822     Anti-infectives (From admission, onward)    Start     Dose/Rate  Route Frequency Ordered Stop   03/20/24 1315  cefoTEtan (CEFOTAN) 2 g in sodium chloride 0.9 % 100 mL IVPB  Status:  Discontinued        2 g 200 mL/hr over 30 Minutes Intravenous  Once 03/20/24 1228 03/24/24 0924   03/20/24 0830  cefoTEtan (CEFOTAN) 2 g in sodium chloride 0.9 % 100 mL IVPB        2 g 200 mL/hr over 30 Minutes Intravenous  Once 03/19/24 1739 03/20/24 0825   03/13/24 1200  levofloxacin (LEVAQUIN) tablet 750 mg        750 mg Oral Daily 03/13/24 1148 03/15/24 1413   03/10/24 1600  vancomycin (VANCOREADY) IVPB 1250 mg/250 mL  Status:  Discontinued         1,250 mg 166.7 mL/hr over 90 Minutes Intravenous Every 24 hours 03/10/24 1056 03/10/24 1250   03/10/24 0945  azithromycin (ZITHROMAX) 500 mg in sodium chloride 0.9 % 250 mL IVPB        500 mg 250 mL/hr over 60 Minutes Intravenous Every 24 hours 03/10/24 0934 03/12/24 1024   03/10/24 0200  metroNIDAZOLE (FLAGYL) IVPB 500 mg  Status:  Discontinued        500 mg 100 mL/hr over 60 Minutes Intravenous Every 12 hours 03/09/24 1515 03/10/24 0933   03/10/24 0000  ceFEPIme (MAXIPIME) 2 g in sodium chloride 0.9 % 100 mL IVPB  Status:  Discontinued        2 g 200 mL/hr over 30 Minutes Intravenous Every 12 hours 03/09/24 1541 03/13/24 1148   03/09/24 1600  vancomycin (VANCOCIN) IVPB 1000 mg/200 mL premix        1,000 mg 200 mL/hr over 60 Minutes Intravenous  Once 03/09/24 1537 03/09/24 1834   03/09/24 1537  vancomycin variable dose per unstable renal function (pharmacist dosing)  Status:  Discontinued         Does not apply See admin instructions 03/09/24 1537 03/10/24 1056   03/09/24 1515  metroNIDAZOLE (FLAGYL) IVPB 500 mg  Status:  Discontinued        500 mg 100 mL/hr over 60 Minutes Intravenous Every 12 hours 03/09/24 1504 03/09/24 1515   03/09/24 1200  ceFEPIme (MAXIPIME) 2 g in sodium chloride 0.9 % 100 mL IVPB        2 g 200 mL/hr over 30 Minutes Intravenous  Once 03/09/24 1152 03/09/24 1304   03/09/24 1200  metroNIDAZOLE (FLAGYL) IVPB 500 mg        500 mg 100 mL/hr over 60 Minutes Intravenous  Once 03/09/24 1152 03/09/24 1505   03/09/24 1200  vancomycin (VANCOCIN) IVPB 1000 mg/200 mL premix        1,000 mg 200 mL/hr over 60 Minutes Intravenous  Once 03/09/24 1152 03/09/24 1523              Family Communication/Anticipated D/C date and plan/Code Status   DVT prophylaxis: enoxaparin (LOVENOX) injection 40 mg Start: 03/26/24 2200 Place TED hose Start: 03/09/24 1502     Code Status: Full Code  Family Communication: Plan discussed with Nettie Elm, wife, at the bedside  bedside.  All her questions were answered. Disposition Plan: Plan to discharge to SNF   Status is: Inpatient Remains inpatient appropriate because: Persistent ileus       Subjective:   Interval events noted.  Complains of nausea and vomiting.  He said he vomited about 3 times this morning.  His wife was at the bedside.   Objective:    Vitals:   03/31/24  2031 04/01/24 0326 04/01/24 0500 04/01/24 0857  BP: 138/70 130/68  127/70  Pulse: 82 72  77  Resp: 18 18  18   Temp: 98.6 F (37 C) 98.5 F (36.9 C)  97.7 F (36.5 C)  TempSrc: Oral   Oral  SpO2: 95% 96%  96%  Weight:   81.4 kg   Height:       No data found.   Intake/Output Summary (Last 24 hours) at 04/01/2024 0950 Last data filed at 04/01/2024 0500 Gross per 24 hour  Intake 2685.32 ml  Output 800 ml  Net 1885.32 ml   Filed Weights   03/30/24 0349 03/31/24 0441 04/01/24 0500  Weight: 79.7 kg 81.6 kg 81.4 kg    Exam:    GEN: NAD SKIN: Warm and dry EYES: No pallor or icterus ENT: MMM CV: RRR PULM: CTA B ABD: soft, distended, NT, +BS, + ileostomy with drainage CNS: AAO x 3, non focal EXT: No edema or tenderness   Data Reviewed:   I have personally reviewed following labs and imaging studies:  Labs: Labs show the following:   Basic Metabolic Panel: Recent Labs  Lab 03/26/24 0409 03/27/24 0648 03/28/24 0843 03/29/24 0735 03/30/24 1232 04/01/24 0747  NA 137 138 142 142 142 137  K 5.0 4.1 4.1 4.4 4.9 4.8  CL 102 106 109 111 113* 108  CO2 27 25 24 23  20* 22  GLUCOSE 107* 348* 135* 156* 145* 147*  BUN 49* 39* 36* 34* 38* 30*  CREATININE 1.46* 1.32* 1.01 0.95 1.00 0.88  CALCIUM 8.9 8.6* 8.8* 9.1 9.3 9.1  MG 2.7* 2.3 2.4 2.3  --  2.1  PHOS 5.4* 3.9 3.3 3.2  --  3.3   GFR Estimated Creatinine Clearance: 63.6 mL/min (by C-G formula based on SCr of 0.88 mg/dL). Liver Function Tests: Recent Labs  Lab 03/26/24 0409 03/28/24 0843 03/29/24 0735 03/30/24 1232 04/01/24 0747  AST 22 14* 14*  15 22  ALT 25 15 16 17 28   ALKPHOS 63 47 49 58 74  BILITOT 1.1 0.8 0.7 0.8 0.5  PROT 6.6 6.5 6.8 7.1 6.8  ALBUMIN 2.7* 3.3* 3.3* 3.3* 2.9*   No results for input(s): "LIPASE", "AMYLASE" in the last 168 hours. No results for input(s): "AMMONIA" in the last 168 hours. Coagulation profile No results for input(s): "INR", "PROTIME" in the last 168 hours.  CBC: Recent Labs  Lab 03/26/24 0409 03/27/24 0648 03/29/24 0735  WBC 13.3* 10.3 6.4  HGB 13.6 13.2 13.2  HCT 41.2 40.7 41.1  MCV 98.6 99.5 101.5*  PLT 260 243 224   Cardiac Enzymes: No results for input(s): "CKTOTAL", "CKMB", "CKMBINDEX", "TROPONINI" in the last 168 hours. BNP (last 3 results) No results for input(s): "PROBNP" in the last 8760 hours. CBG: Recent Labs  Lab 03/31/24 0904 03/31/24 1155 03/31/24 1729 04/01/24 0040 04/01/24 0528  GLUCAP 138* 138* 145* 143* 154*   D-Dimer: No results for input(s): "DDIMER" in the last 72 hours. Hgb A1c: No results for input(s): "HGBA1C" in the last 72 hours.  Lipid Profile: Recent Labs    04/01/24 0747  TRIG 121    Thyroid function studies: No results for input(s): "TSH", "T4TOTAL", "T3FREE", "THYROIDAB" in the last 72 hours.  Invalid input(s): "FREET3" Anemia work up: No results for input(s): "VITAMINB12", "FOLATE", "FERRITIN", "TIBC", "IRON", "RETICCTPCT" in the last 72 hours. Sepsis Labs: Recent Labs  Lab 03/26/24 0409 03/27/24 0648 03/29/24 0735  WBC 13.3* 10.3 6.4    Microbiology No results found for  this or any previous visit (from the past 240 hours).  Procedures and diagnostic studies:  No results found.              LOS: 23 days   Shantee Hayne  [employer] Chartered loss adjuster on [email or web address]. If 7PM-7AM, please contact night-coverage at [email or web address]     04/01/2024, 9:50 AM

## 2024-04-01 NOTE — TOC Progression Note (Signed)
 Transition of Care [facility]) - Progression Note    Patient Details  Name: Todd Rojas MRN: [id number] Date of Birth: 1941-08-26  Transition of [facility]) CM/SW Contact  Odilia Bennett, LCSW Phone Number: 04/01/2024, 12:48 PM  Clinical Narrative: CSW will continue to follow progress. Patient will need a new SNF insurance authorization once medically stable.    Expected Discharge Plan: Skilled Nursing Facility Barriers to Discharge: Continued Medical Work up  Expected Discharge Plan and Services     Post Acute Care Choice:  (TBD) Living arrangements for the past 2 months: Single Family Home                                       Social Determinants of Health (SDOH) Interventions SDOH Screenings   Food Insecurity: No Food Insecurity (03/10/2024)  Housing: Low Risk  (03/10/2024)  Transportation Needs: No Transportation Needs (03/10/2024)  Utilities: Not At Risk (03/10/2024)  Depression (PHQ2-9): Low Risk  (07/12/2021)  Financial Resource Strain: Low Risk  (10/23/2023)   Received from [facility]  Social Connections: Socially Integrated (03/10/2024)  Tobacco Use: Medium Risk (03/25/2024)    Readmission Risk Interventions     No data to display

## 2024-04-01 NOTE — Progress Notes (Signed)
 16 fr. NG tube insertion attempted without success when resistance was met AFTER epigottis area. Tried 14 fr NG tube and had zero resistance. KUB ordered. Pt tolerated 14 fr insertion very well.

## 2024-04-01 NOTE — Progress Notes (Addendum)
 [facility] SURGICAL PROGRESS NOTE  Hospital Day(s): 23.   Post op day(s): 12 Days Post-Op.   Interval History:  Patient seen and examined NGT removed over the weekend Patient notes increase in nausea overnight Remains distended this morning No significant abdominal pain No fever, chills  Labs are pending this AM UO - 450 ccs Ileostomy with  350 ccs out recorded  Vital signs in last 24 hours: [min-max] current  Temp:  [97.4 F (36.3 C)-98.6 F (37 C)] 98.5 F (36.9 C) (04/14 0326) Pulse Rate:  [70-82] 72 (04/14 0326) Resp:  [17-19] 18 (04/14 0326) BP: (123-138)/(64-70) 130/68 (04/14 0326) SpO2:  [94 %-96 %] 96 % (04/14 0326) Weight:  [81.4 kg] 81.4 kg (04/14 0500)     Height: 5\' 9"  (175.3 cm) Weight: 81.4 kg BMI (Calculated): 26.48   Intake/Output last 2 shifts:  04/13 0701 - 04/14 0700 In: 2685.3 [I.V.:2685.3] Out: 800 [Urine:450; Stool:350]   Physical Exam:  Constitutional: alert, awake, NAD Respiratory: breathing non-labored at rest  Cardiovascular: regular rate and sinus rhythm  Gastrointestinal: soft, he does not appear overtly tender, continues to be distended/tympanic, no rebound/guarding. Ileostomy in RLQ, stool and gas in bag this AM Integumentary: Laparoscopic incisions are CDI with dermabond   Labs:     Latest Ref Rng & Units 03/29/2024    7:35 AM 03/27/2024    6:48 AM 03/26/2024    4:09 AM  CBC  WBC 4.0 - 10.5 K/uL 6.4  10.3  13.3   Hemoglobin 13.0 - 17.0 g/dL 78.2  95.6  21.3   Hematocrit 39.0 - 52.0 % 41.1  40.7  41.2   Platelets 150 - 400 K/uL 224  243  260       Latest Ref Rng & Units 03/30/2024   12:32 PM 03/29/2024    7:35 AM 03/28/2024    8:43 AM  CMP  Glucose 70 - 99 mg/dL 086  578  469   BUN 8 - 23 mg/dL 38  34  36   Creatinine 0.61 - 1.24 mg/dL 6.29  5.28  4.13   Sodium 135 - 145 mmol/L 142  142  142   Potassium 3.5 - 5.1 mmol/L 4.9  4.4  4.1   Chloride 98 - 111 mmol/L 113  111  109   CO2 22 - 32 mmol/L 20  23  24     Calcium 8.9 - 10.3 mg/dL 9.3  9.1  8.8   Total Protein 6.5 - 8.1 g/dL 7.1  6.8  6.5   Total Bilirubin 0.0 - 1.2 mg/dL 0.8  0.7  0.8   Alkaline Phos 38 - 126 U/L 58  49  47   AST 15 - 41 U/L 15  14  14    ALT 0 - 44 U/L 17  16  15       Imaging studies:  No new imaging studies this AM   Assessment/Plan:  83 y.o. male with post-operative ileus 12 Days Post-Op s/p robotic assisted laparoscopic loop ileostomy with decompression of cecum for Ogilvie Syndrome, complicated by pertinent comorbidities including COPD, asthma, CAD, history of MI, CHF, HTN, HLD.    - Unfortunately, he reports increase in nausea this AM and with frequent hiccups as well. Abdomen remains distended without evidence of peritonitis. I do feel he still likely has unresolved ileus. He did have emesis shortly after my evaluation,. I do feel he will need replacement of NGT. I will also repeat KUB this AM. This is a difficult situation without good  answer other than time. No evidence of complication (abscess, perforation, obstruction) to warrant further intervention.    - Continue TPN; monitor electrolytes  - No need for emergent surgical intervention   - Monitor abdominal examination; on-going ileostomy function   - Pain control prn; minimize narcotics as feasible - Antiemetics prn   - Encouraged frequent mobilization as feasible - therapies following   - Further management per primary service; we will follow   - Discharge Planning: Not ready for discharge, still with ileus   All of the above findings and recommendations were discussed with the patient, patient's family (wife at bedside), and the medical team, and all of their questions were answered to their expressed satisfaction.  -- Apolonio Bay, PA-C [facility] 04/01/2024, 8:05 AM M-F: 7am - 4pm

## 2024-04-01 NOTE — Progress Notes (Signed)
 PHARMACY - TOTAL PARENTERAL NUTRITION CONSULT NOTE   Indication: Prolonged illeus  Patient Measurements: Height: 5\' 9"  (175.3 cm) Weight: 81.4 kg (179 lb 6.4 oz) IBW/kg (Calculated) : 70.7 TPN AdjBW (KG): 77.3 Body mass index is 26.49 kg/m.  Assessment: 83 y.o. 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   Glucose / Insulin:  BG 138-154 5u SSI last 24h A1C 6.5 Electrolytes: WNL Renal: SCr ~ 1.  Hepatic: LFTs within normal limits TG: 121 Intake / Output;   Intake/Output Summary (Last 24 hours) at 04/01/2024 0956 Last data filed at 04/01/2024 0500 Gross per 24 hour  Intake 2685.32 ml  Output 800 ml  Net 1885.32 ml    MIVF: N/A GI Imaging:Serial KUB prn 4/7: abd CT: postoperative ileus  4/10: abd XR: Persistent small-bowel dilatation  GI Surgeries / Procedures:  4/2-s/p surgery-robotic assisted laparoscopic loop ileostomy with decompression of cecum for Ogilvie Syndrome,    Central access: 03/18/24 TPN start date: 03/18/24>>4/5, resumed 4/9  Nutritional Goals: Goal TPN rate is 80 mL/hr (provides 105.6 g of protein and 2042.9 kcals per day)  RD Assessment: Estimated Needs Total Energy Estimated Needs: 1900-2200kcal/day Total Protein Estimated Needs: 95-110g/day Total Fluid Estimated Needs: 1.9-2.2L/day  Current Nutrition:   CLD 4/13- back to NPO 4/14, NG tube to be restarted 4/14- pt w/ nausea, still w/ ileus per surgery note  Plan:  Continue custom TPN at 80 mL/hr at 1800 Electrolytes in TPN: Na 21mEq/L, K 50mEq/L, Ca 82mEq/L, Mg 9mEq/L, and Phos 15mmol/L. Cl:Ac 1:1 Add standard MVI and trace elements to TPN Thiamine 100 mg IV daily x 7 days per dietary Continue Sensitive q6h SSI and adjust as needed  Monitor TPN labs on Mon/Thurs, and daily until stable  Scotty Cyphers,  PharmD Clinical Pharmacist 04/01/2024

## 2024-04-01 NOTE — Progress Notes (Signed)
 Physical Therapy Treatment Patient Details Name: Todd Rojas MRN: [id number] DOB: 1941-02-14 Today's Date: 04/01/2024   History of Present Illness Pt is an 83 year old male   MD work up including sepsis, acute hypoxic respiratory failure, PNA    Pmh significant for CKD stage IIIa, chronic gout, hypertension, hyperlipidemia, interstitial pulmonary disease, bronchiectasis, COPD. Pt is s/p exploratory lapileostomy and decompression of cecum on 4/2.    PT Comments  Pt received semi reclined in bed sleeping. Spouse assists in waking pt up and agreeable to PT. RN has capped NG tube in prep for mobility. Pt declines LB dressing likely due to abdominal pain thus reliant on PT to don socks. Remains needing minA+1 for bed mobility but standing CGA to RW with safe hand placement. Pt completing well over 300' ambulating 1.4'/sec indicative of low falls risk with short community ambulation distances. Pt does slow down towards end of bout due to fatigue requesting to sit in recliner. All needs in reach. Pt making excellent progress towards goals. May consider changing d/c recs next visit and pending medical work up. Will discuss with pt and spouse.    If plan is discharge home, recommend the following: Assist for transportation;Help with stairs or ramp for entrance;A little help with bathing/dressing/bathroom;A little help with walking and/or transfers;Assistance with cooking/housework   Can travel by private vehicle     Yes  Equipment Recommendations  Rolling walker (2 wheels);BSC/3in1    Recommendations for Other Services       Precautions / Restrictions Precautions Precautions: Fall Recall of Precautions/Restrictions: Intact Restrictions Weight Bearing Restrictions Per Provider Order: No     Mobility  Bed Mobility Overal bed mobility: Needs Assistance Bed Mobility: Supine to Sit     Supine to sit: HOB elevated, Min assist     General bed mobility comments: minA on SUE support to  attain seated EOB Patient Response: Cooperative  Transfers Overall transfer level: Needs assistance Equipment used: Rolling walker (2 wheels) Transfers: Sit to/from Stand Sit to Stand: Contact guard assist                Ambulation/Gait Ambulation/Gait assistance: Supervision Gait Distance (Feet): 364 Feet Assistive device: Rolling walker (2 wheels) Gait Pattern/deviations: Step-through pattern, Decreased stride length Gait velocity: completes 10' gait speed in 7 seconds = 1.42'/sec Gait velocity interpretation: 1.31 - 2.62 ft/sec, indicative of limited Financial controller     Tilt Bed Tilt Bed Patient Response: Cooperative  Modified Rankin (Stroke Patients Only)       Balance Overall balance assessment: Mild deficits observed, not formally tested                                          Communication Communication Communication: Impaired Factors Affecting Communication: Hearing impaired  Cognition Arousal: Alert Behavior During Therapy: Flat affect   PT - Cognitive impairments: No apparent impairments                         Following commands: Intact Following commands impaired: Follows one step commands with increased time    Cueing Cueing Techniques: Verbal cues, Tactile cues  Exercises      General Comments        Pertinent Vitals/Pain Pain Assessment Pain Assessment:  0-10 Pain Score: 3  Pain Location: abdomen Pain Descriptors / Indicators: Sore, Discomfort Pain Intervention(s): Monitored during session, Repositioned    Home Living                          Prior Function            PT Goals (current goals can now be found in the care plan section) Acute Rehab PT Goals Patient Stated Goal: to feel better Time For Goal Achievement: 04/04/24 Potential to Achieve Goals: Good Progress towards PT goals: Progressing toward goals     Frequency    Min 2X/week      PT Plan      Co-evaluation              AM-PAC PT "6 Clicks" Mobility   Outcome Measure  Help needed turning from your back to your side while in a flat bed without using bedrails?: A Little Help needed moving from lying on your back to sitting on the side of a flat bed without using bedrails?: A Little Help needed moving to and from a bed to a chair (including a wheelchair)?: A Little Help needed standing up from a chair using your arms (e.g., wheelchair or bedside chair)?: A Little Help needed to walk in hospital room?: A Little Help needed climbing 3-5 steps with a railing? : A Lot 6 Click Score: 17    End of Session Equipment Utilized During Treatment: Gait belt Activity Tolerance: Patient tolerated treatment well Patient left: in chair;with call bell/phone within reach;with chair alarm set;with family/visitor present Nurse Communication: Mobility status PT Visit Diagnosis: Other abnormalities of gait and mobility (R26.89);Difficulty in walking, not elsewhere classified (R26.2);Muscle weakness (generalized) (M62.81)     Time: 1431-1450 PT Time Calculation (min) (ACUTE ONLY): 19 min  Charges:    $Therapeutic Activity: 8-22 mins PT General Charges $$ ACUTE PT VISIT: 1 Visit                     [name]. Fairly IV, PT, DPT Physical Therapist- [facility]  04/01/2024, 3:17 PM

## 2024-04-02 ENCOUNTER — Inpatient Hospital Stay

## 2024-04-02 DIAGNOSIS — R6520 Severe sepsis without septic shock: Secondary | ICD-10-CM | POA: Diagnosis not present

## 2024-04-02 DIAGNOSIS — A419 Sepsis, unspecified organism: Secondary | ICD-10-CM | POA: Diagnosis not present

## 2024-04-02 LAB — GLUCOSE, CAPILLARY
Glucose-Capillary: 124 mg/dL — ABNORMAL HIGH (ref 70–99)
Glucose-Capillary: 129 mg/dL — ABNORMAL HIGH (ref 70–99)
Glucose-Capillary: 174 mg/dL — ABNORMAL HIGH (ref 70–99)
Glucose-Capillary: 92 mg/dL (ref 70–99)

## 2024-04-02 LAB — BASIC METABOLIC PANEL WITH GFR
Anion gap: 5 (ref 5–15)
BUN: 31 mg/dL — ABNORMAL HIGH (ref 8–23)
CO2: 23 mmol/L (ref 22–32)
Calcium: 8.7 mg/dL — ABNORMAL LOW (ref 8.9–10.3)
Chloride: 109 mmol/L (ref 98–111)
Creatinine, Ser: 0.89 mg/dL (ref 0.61–1.24)
GFR, Estimated: >60 mL/min (ref >60)
Glucose, Bld: 120 mg/dL — ABNORMAL HIGH (ref 70–99)
Potassium: 4.6 mmol/L (ref 3.5–5.1)
Sodium: 137 mmol/L (ref 135–145)

## 2024-04-02 LAB — CBC
HCT: 37.3 % — ABNORMAL LOW (ref 39.0–52.0)
Hemoglobin: 11.9 g/dL — ABNORMAL LOW (ref 13.0–17.0)
MCH: 32.6 pg (ref 26.0–34.0)
MCHC: 31.9 g/dL (ref 30.0–36.0)
MCV: 102.2 fL — ABNORMAL HIGH (ref 80.0–100.0)
Platelets: 202 10*3/uL (ref 150–400)
RBC: 3.65 MIL/uL — ABNORMAL LOW (ref 4.22–5.81)
RDW: 14.2 % (ref 11.5–15.5)
WBC: 10.3 10*3/uL (ref 4.0–10.5)
nRBC: 0 % (ref 0.0–0.2)

## 2024-04-02 MED ORDER — IOHEXOL 300 MG/ML  SOLN
100.0000 mL | Freq: Once | INTRAMUSCULAR | Status: AC | PRN
  Administered 2024-04-02: 100 mL via INTRAVENOUS

## 2024-04-02 MED ORDER — TRAVASOL 10 % IV SOLN
INTRAVENOUS | Status: AC
  Filled 2024-04-02: qty 1056

## 2024-04-02 MED ORDER — ACETAMINOPHEN 10 MG/ML IV SOLN
1000.0000 mg | Freq: Four times a day (QID) | INTRAVENOUS | Status: AC
  Administered 2024-04-02 – 2024-04-03 (×4): 1000 mg via INTRAVENOUS
  Filled 2024-04-02 (×4): qty 100

## 2024-04-02 NOTE — Progress Notes (Signed)
 Nutrition Follow Up Note   DOCUMENTATION CODES:   Severe malnutrition in context of acute illness/injury  INTERVENTION:   Continue TPN per pharmacy  Daily weights    RD will add supplements with diet advancement   NUTRITION DIAGNOSIS:   Severe Malnutrition related to acute illness as evidenced by mild fat depletion, moderate fat depletion, mild muscle depletion, moderate muscle depletion. 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  Pt continues to tolerate TPN well at goal rate. Refeed labs stable. NGT remains in place. Abdomen remains distended. Pt is having some ostomy output. KUB reporting persistent small bowel dilatation. CT abdomen pending. Per chart, pt has remained weight stable for the past 6 days. RD will add supplements with diet advancement.       Medications reviewed and include: allopurinol, aspirin, lovenox, insulin, MVI, protonix, thiamine, TPN  Labs reviewed: K 4.6 wnl, BUN 31(H) P 3.3 wnl, Mg 2.1 wnl- 4/14 Triglycerides 121- 4/14 Hgb 11.9(L), Hct 37.3(L) Cbgs- 174, 92 x 24 hrs   UOP-   Diet Order:    Diet Order             Diet NPO time specified Except for: Ice Chips  Diet effective now                  EDUCATION NEEDS:   Education needs have been addressed  Skin:  Skin Assessment: Reviewed RN Assessment (incision abdomen)  Last BM:  4/15- via ostomy  Height:   Ht Readings from Last 1 Encounters:  03/15/24 5\' 9"  (1.753 m)    Weight:   Wt Readings from  Last 1 Encounters:  04/01/24 81.4 kg    Ideal Body Weight:  72.7 kg  BMI:  Body mass index is 26.49 kg/m.  Estimated Nutritional Needs:   Kcal:  2000-2300kcal/day  Protein:  100-115g/day  Fluid:  1.9-2.2L/day  Torrance Freestone MS, RD, LDN If unable to be reached, please send secure chat to "RD inpatient" available from 8:00a-4:00p daily

## 2024-04-02 NOTE — Progress Notes (Signed)
 PHARMACY - TOTAL PARENTERAL NUTRITION CONSULT NOTE   Indication: Prolonged illeus  Patient Measurements: Height: 5\' 9"  (175.3 cm) Weight: 81.4 kg (179 lb 6.4 oz) IBW/kg (Calculated) : 70.7 TPN AdjBW (KG): 77.3 Body mass index is 26.49 kg/m.  Assessment: 83 y.o. 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   Glucose / Insulin:  BG 120-165 4u SSI last 24h A1C 6.5 Electrolytes: WNL Renal: SCr ~ 1.  Hepatic: LFTs within normal limits TG: 121 Intake / Output;   Intake/Output Summary (Last 24 hours) at 04/02/2024 0934 Last data filed at 04/02/2024 0130 Gross per 24 hour  Intake --  Output 1100 ml  Net -1100 ml    MIVF: LR @ 75 ml/hr GI Imaging:Serial KUB prn 4/7: abd CT: postoperative ileus  4/10: abd XR: Persistent small-bowel dilatation  GI Surgeries / Procedures:  4/2-s/p surgery-robotic assisted laparoscopic loop ileostomy with decompression of cecum for Ogilvie Syndrome,    Central access: 03/18/24 TPN start date: 03/18/24>>4/5, resumed 4/9  Nutritional Goals: Goal TPN rate is 80 mL/hr (provides 105.6 g of protein and 2042.9 kcals per day)  RD Assessment: Estimated Needs Total Energy Estimated Needs: 1900-2200kcal/day Total Protein Estimated Needs: 95-110g/day Total Fluid Estimated Needs: 1.9-2.2L/day  Current Nutrition:  NPO.  May need to leave NGT in place once we can start CLD again. Suspect this may be a few days   Plan:  Continue custom TPN at 80 mL/hr at 1800 Electrolytes in TPN: Na 50mEq/L, K 50mEq/L, Ca 49mEq/L, Mg 59mEq/L, and Phos 15mmol/L. Cl:Ac 1:1 Add standard MVI and trace elements to TPN Thiamine 100 mg IV daily x 7 days per dietary Continue Sensitive q6h SSI and adjust as needed  Monitor TPN labs on Mon/Thurs, and daily until stable  Scotty Cyphers,  PharmD Clinical Pharmacist 04/02/2024

## 2024-04-02 NOTE — Progress Notes (Signed)
 [facility] SURGICAL PROGRESS NOTE  Hospital Day(s): 24.   Post op day(s): 13 Days Post-Op.   Interval History:  Patient seen and examined Patient reports he feels a little better today No abdominal pain Remains distended  No fever, chills  Remains without leukocytosis; WBC 10.3K Hgb to 11.9 Renal function normal; sCr - 0.89; UO - 950 ccs NGT replaced (04/14); output has not been recorded Ileostomy with 150 ccs out recorded  Vital signs in last 24 hours: [min-max] current  Temp:  [97.7 F (36.5 C)-98.2 F (36.8 C)] 98.2 F (36.8 C) (04/15 0155) Pulse Rate:  [62-77] 65 (04/15 0155) Resp:  [16-19] 16 (04/15 0155) BP: (122-128)/(57-70) 122/65 (04/15 0155) SpO2:  [94 %-97 %] 94 % (04/15 0155)     Height: 5\' 9"  (175.3 cm) Weight: 81.4 kg BMI (Calculated): 26.48   Intake/Output last 2 shifts:  04/14 0701 - 04/15 0700 In: -  Out: 1100 [Urine:950; Stool:150]   Physical Exam:  Constitutional: alert, awake, NAD HEENT: NGT in place; 400-500 ccs in canister Respiratory: breathing non-labored at rest  Cardiovascular: regular rate and sinus rhythm  Gastrointestinal: soft, he does not appear overtly tender, continues to be distended/tympanic, no rebound/guarding. Ileostomy in RLQ, stool and gas in bag this AM Integumentary: Laparoscopic incisions are CDI with dermabond   Labs:     Latest Ref Rng & Units 04/02/2024    5:03 AM 03/29/2024    7:35 AM 03/27/2024    6:48 AM  CBC  WBC 4.0 - 10.5 K/uL 10.3  6.4  10.3   Hemoglobin 13.0 - 17.0 g/dL 16.1  09.6  04.5   Hematocrit 39.0 - 52.0 % 37.3  41.1  40.7   Platelets 150 - 400 K/uL 202  224  243       Latest Ref Rng & Units 04/02/2024    5:03 AM 04/01/2024    7:47 AM 03/30/2024   12:32 PM  CMP  Glucose 70 - 99 mg/dL 409  811  914   BUN 8 - 23 mg/dL 31  30  38   Creatinine 0.61 - 1.24 mg/dL 7.82  9.56  2.13   Sodium 135 - 145 mmol/L 137  137  142   Potassium 3.5 - 5.1 mmol/L 4.6  4.8  4.9   Chloride 98 - 111 mmol/L  109  108  113   CO2 22 - 32 mmol/L 23  22  20    Calcium 8.9 - 10.3 mg/dL 8.7  9.1  9.3   Total Protein 6.5 - 8.1 g/dL  6.8  7.1   Total Bilirubin 0.0 - 1.2 mg/dL  0.5  0.8   Alkaline Phos 38 - 126 U/L  74  58   AST 15 - 41 U/L  22  15   ALT 0 - 44 U/L  28  17      Imaging studies:  No new imaging studies this AM   Assessment/Plan:  83 y.o. male with post-operative ileus 13 Days Post-Op s/p robotic assisted laparoscopic loop ileostomy with decompression of cecum for Ogilvie Syndrome, complicated by pertinent comorbidities including COPD, asthma, CAD, history of MI, CHF, HTN, HLD.    - Will need to continue NGT decompression; LIS; monitor and record output - He understands we will continue to take things slow. May need to leave NGT in place once we can start CLD again. Suspect this may be a few days.   - May consider repeat CT - no clear indication, no evidence of  infection/perforation; however, unclear why ileus has failed to improve   - Continue TPN; monitor electrolytes  - No need for emergent surgical intervention   - Monitor abdominal examination; on-going ileostomy function   - Pain control prn; minimize narcotics as feasible - Antiemetics prn   - Encouraged frequent mobilization as feasible - therapies following, walked 300 feet per notes  - Further management per primary service; we will follow   - Discharge Planning: Not ready for discharge, still with ileus   All of the above findings and recommendations were discussed with the patient, patient's family (wife at bedside), and the medical team, and all of their questions were answered to their expressed satisfaction.  -- Apolonio Bay, PA-C [facility] 04/02/2024, 7:20 AM M-F: 7am - 4pm

## 2024-04-02 NOTE — Progress Notes (Signed)
 Occupational Therapy Treatment Patient Details Name: Todd Rojas MRN: [id number] DOB: [birth year] Today's Date: 04/02/2024   History of present illness Pt is an 83 year old male   MD work up including sepsis, acute hypoxic respiratory failure, PNA    Pmh significant for CKD stage IIIa, chronic gout, hypertension, hyperlipidemia, interstitial pulmonary disease, bronchiectasis, COPD. Pt is s/p exploratory lapileostomy and decompression of cecum on 4/2.   OT comments  Upon entering the room, pt supine in bed with wife and other family present in room. Pt endorses fatigue from testing today and needing encouragement to participate. Pt declines self care tasks this session. He needs assistance to get into figure four position for LB dressing and then declined to attempt to don socks himself. Pt stands with min A from standard bed height and use of RW. He requests to ambulate around the nurses session 150' with RW and CGA. Pt returning to room and sits up in recliner chair with family remaining in room and call bell within reach.       If plan is discharge home, recommend the following:  A little help with walking and/or transfers;A little help with bathing/dressing/bathroom;Assistance with cooking/housework;Assist for transportation;Help with stairs or ramp for entrance   Equipment Recommendations  BSC/3in1;Tub/shower seat       Precautions / Restrictions Precautions Precautions: Fall Recall of Precautions/Restrictions: Intact       Mobility Bed Mobility Overal bed mobility: Needs Assistance Bed Mobility: Supine to Sit     Supine to sit: Min assist, HOB elevated          Transfers Overall transfer level: Needs assistance Equipment used: Rolling walker (2 wheels) Transfers: Sit to/from Stand Sit to Stand: Min assist                     ADL either performed or assessed with clinical judgement   ADL                                          General ADL Comments: Pt declined toileting and standing grooming tasks. Pt able to partially perform figure four position but needing assistance to get foot to lap. Pt then declined attempts to don sock himself and needing assist from therapist.    Extremity/Trunk Assessment Upper Extremity Assessment Upper Extremity Assessment: Generalized weakness   Lower Extremity Assessment Lower Extremity Assessment: Generalized weakness                 Communication Communication Communication: Impaired Factors Affecting Communication: Hearing impaired   Cognition Arousal: Alert Behavior During Therapy: Flat affect Cognition: No apparent impairments Difficult to assess due to: Hard of hearing/deaf                             Following commands: Intact Following commands impaired: Follows one step commands with increased time, Only follows one step commands consistently      Cueing   Cueing Techniques: Verbal cues, Tactile cues             Pertinent Vitals/ Pain       Pain Assessment Pain Assessment: Faces Faces Pain Scale: Hurts little more Pain Location: abdomen Pain Descriptors / Indicators: Sore, Discomfort Pain Intervention(s): Limited activity within patient's tolerance, Monitored during session, Repositioned         Frequency  Min  2X/week        Progress Toward Goals  OT Goals(current goals can now be found in the care plan section)  Progress towards OT goals: Progressing toward goals      AM-PAC OT "6 Clicks" Daily Activity     Outcome Measure   Help from another person eating meals?: None Help from another person taking care of personal grooming?: A Little Help from another person toileting, which includes using toliet, bedpan, or urinal?: A Lot Help from another person bathing (including washing, rinsing, drying)?: A Lot Help from another person to put on and taking off regular upper body clothing?: A Little Help from another person to  put on and taking off regular lower body clothing?: A Lot 6 Click Score: 16    End of Session Equipment Utilized During Treatment: Rolling walker (2 wheels)  OT Visit Diagnosis: Other abnormalities of gait and mobility (R26.89)   Activity Tolerance Patient tolerated treatment well   Patient Left with call bell/phone within reach;with family/visitor present;in chair   Nurse Communication Mobility status        Time: 1610-9604 OT Time Calculation (min): 16 min  Charges: OT General Charges $OT Visit: 1 Visit OT Treatments $Therapeutic Activity: 8-22 mins  George Kinder, MS, OTR/L , CBIS ascom [phone]  04/02/24, 3:32 PM

## 2024-04-02 NOTE — Plan of Care (Signed)
 Received report, lying supine in bed. Awake. Reports pain is tolerable. Repositioned to R side and q2hrs. #16 FR, NG tube noted and taped securely at R nare, + placement confirmed with air entry and auscualtation of stomach/abdomen, connected to  low intermittent wall suction and draining clear green/brown stomach contents.  Attached to gown with safety pin. Tolerating icechips, using urinal. Remains NPO. R dble lumen PICC line intact with good blood return and flushes well with NS. TPN infusing at 80 via pump, tolerated well. Assessment completed.  Requesting analgesia and given for abd pain. NG leaking at connection to wall suction. Linen and gown changed. Rolls easily from side to side with little assist.

## 2024-04-02 NOTE — Progress Notes (Signed)
 Progress Note    Todd Rojas  XBJ:[id number] DOB: 1941/09/12  DOA: 03/09/2024 PCP: Barbette Reichmann, MD      Brief Narrative:    Medical records reviewed and are as summarized below:  Todd Rojas is a 83 y.o. male  with history of CKD stage IIIa, chronic gout, hypertension, hyperlipidemia, interstitial pulmonary disease, bronchiectasis, COPD, who presents emergency department for chief concerns of fever, chills, shortness of breath for 2 weeks.   Vitals in the ED showed temperature of 98.4, respiration rate 25, heart rate 94, blood pressure initially 96/69 and improved to 107/66, SpO2 initially desatted to 88% on room air   Status post exploratory laparotomy with robotic assisted laparoscopic loop ileostomy and decompression of cecum on 4/2.  Tolerated procedure well.  No ischemic gut noted   4/7: Worsening nausea.  Inability to tolerate p.o.  Repeat KUB demonstrating ileus.  General surgery reengaged.  Ordering CT abdomen.   4/8: Conservative management per surgery.  No evidence of obstruction.  CT reassuring.  Diet scaled back to n.p.o. for now.  Clear liquids later today if stable.      Assessment/Plan:   Principal Problem:   Severe sepsis with acute organ dysfunction (HCC) Active Problems:   Fever   Acute hypoxic respiratory failure (HCC)   CAD (coronary artery disease)   Elevated troponin   Gaseous abdominal distention   COPD (chronic obstructive pulmonary disease) (HCC)   Elevated serum creatinine   Hypertension   Hyperlipidemia, unspecified   Primary osteoarthritis of both knees   Thyroid nodule greater than or equal to 1.5 cm in diameter incidentally noted on imaging study   Dyslipidemia   Personal history of gout   Ogilvie syndrome   Protein-calorie malnutrition, severe   Nutrition Problem: Severe Malnutrition Etiology: acute illness  Signs/Symptoms: mild fat depletion, moderate fat depletion, mild muscle depletion, moderate  muscle depletion   Body mass index is 26.49 kg/m.   Gaseous abdominal distention 2/2 Colonic ileus  Ogilvie's syndrome Patient developed abdominal distention and crampy abdominal pain after presentation. Starting 3/25, Imaging with right colonic gaseous distention without any obvious obstruction.  Colonic distention has been worsening. --GenSurg and GI consulted, dx with Ogilvie's syndrome --neostigmine 2 mg push morning of 3/29 with [name] and [name] present with passing of gas and BM afterwards, however, colonic distention unchanged, so pt underwent endoscopic decompression on 3/30.  Unfortunately colonic gas re-accumulated. --started TPN on 3/31 Robotic assisted laparoscopic loop ileostomy with decompression of cecum on 03/20/2024 --Was clinically improving then had deterioration on 4/7.  He was made n.p.o. and was subsequently transitioned to clear liquid diet. He had abdominal pain and vomiting on 03/27/2024 so NGT was replaced. NG tube successfully removed on 03/31/2024 He had recurrent nausea and vomiting on 04/01/2024.  Abdominal x-ray showed persistent gaseous distention so NG tube was reinserted on 04/01/2024. Abdominal x-ray 04/02/2024 showed persistent small bowel dilatation suggesting obstruction.  CT abdomen pelvis ordered for further evaluation. Started on TPN for nutrition on 03/27/2024.  Continue TPN. Discontinue IV fluids. Continue ambulation as needed.  Follow-up with general surgeon   Bradycardia, recurrent Initially noted secondary to neostigmine as administration.  Heart rate was down to 20s.  Patient received atropine.  Heart rate improved.  On 4/3 noted to be bradycardic, asymptomatic with heart rate in the 40s to 50s.  Beta-blockade was held. Use IV metoprolol as needed    Severe sepsis with acute organ dysfunction (HCC) 2/2 PNA Elevated lactic acid  of 2.6, increased respiration rate, with fever of 102 F at home, organ involvement are cardiac and pulmonary with  hypoxic respiratory failure at 88% on room air, elevated high sensitive troponin of 98 and elevated BNP --Imaging concerning for right lower lobe pneumonia, although not much upper respiratory symptoms.  Procalcitonin at 0.352. Respiratory panels were negative. UA was not consistent with UTI Patient received broad-spectrum antibiotics Completed antibiotic course    Acute hypoxic respiratory failure (HCC) Resolved.  He is tolerating room air    Elevated troponin 2/2 demand ischemia First high sensitivity troponin was elevated at 98 >>101>175>226,  Suspect secondary to demand ischemia in setting of severe sepsis with acute hypoxic respiratory failure Echocardiogram with EF of 40% and regional wall motion abnormalities, discussed with cardiology and they think these abnormalities are chronic. Patient was also experiencing intermittent chest pain over the past couple of months which seems more exertional --cardio consulted, no plan for cardiac workup    CAD (coronary artery disease) With history of anterior STEMI in 2006, transferred to [facility] status post cardiac catheterization with PCI to mid LAD using Cypher stent; currently no longer on Plavix Continue aspirin and statin    COPD (chronic obstructive pulmonary disease) (HCC) Bronchodilators as needed   Elevated serum creatinine Creatinine is stable.  Does not meet acute kidney injury criteria, suspect prerenal in setting of nausea vomiting Serum creatinine baseline is 1.35-1.36/eGFR 52-55, consistent with CKD stage III A Monitor BMP.  Minimize use of nephrotoxins     Thyroid nodule greater than or equal to 1.5 cm in diameter incidentally noted on imaging study Incidental finding of a 2.7 cm thyroid nodule. -Thyroid ultrasound with solitary nodule.  Outpatient biopsy recommended --TSH wnl    Rash Developed morning of 3/28, all over chest, back and bilateral thighs.  Appeared to be allergic rxn.  Rash improved with prednisone.   Completed 5 days of prednisone 40 mg daily.   Type II DM, hyperglycemia:  NovoLog as needed for hyperglycemia.  Hemoglobin A1c 6.5    Comorbidities include hypertension, hyperlipidemia, osteoarthritis of both knees s/p bilateral knee arthroplasty with mild right knee effusion, gout    Diet Order             Diet NPO time specified Except for: Ice Chips  Diet effective now                            Consultants: Arts development officer Cardiologist  Procedures: Flexible sigmoidoscopy on 03/17/2024 Robotic assisted laparoscopic loop ileostomy with decompression of cecum on 03/20/2024    Medications:    allopurinol  300 mg Oral Daily   amLODipine  5 mg Oral Daily   aspirin  81 mg Per Tube Daily   Chlorhexidine Gluconate Cloth  6 each Topical Daily   diclofenac Sodium  2 g Topical TID   enoxaparin (LOVENOX) injection  40 mg Subcutaneous Q24H   insulin aspart  0-9 Units Subcutaneous Q6H   mometasone-formoterol  2 puff Inhalation BID   multivitamin with minerals  1 tablet Oral Daily   pantoprazole (PROTONIX) IV  40 mg Intravenous Daily   sodium chloride flush  10-40 mL Intracatheter Q12H   thiamine (VITAMIN B1) injection  100 mg Intravenous Daily   Continuous Infusions:  acetaminophen     promethazine (PHENERGAN) injection (IM or IVPB) 12.5 mg (04/01/24 1119)   TPN ADULT (ION) 80 mL/hr at 04/01/24 1824   TPN ADULT (ION)  Anti-infectives (From admission, onward)    Start     Dose/Rate Route Frequency Ordered Stop   03/20/24 1315  cefoTEtan (CEFOTAN) 2 g in sodium chloride 0.9 % 100 mL IVPB  Status:  Discontinued        2 g 200 mL/hr over 30 Minutes Intravenous  Once 03/20/24 1228 03/24/24 0924   03/20/24 0830  cefoTEtan (CEFOTAN) 2 g in sodium chloride 0.9 % 100 mL IVPB        2 g 200 mL/hr over 30 Minutes Intravenous  Once 03/19/24 1739 03/20/24 0825   03/13/24 1200  levofloxacin (LEVAQUIN) tablet 750 mg        750 mg Oral Daily  03/13/24 1148 03/15/24 1413   03/10/24 1600  vancomycin (VANCOREADY) IVPB 1250 mg/250 mL  Status:  Discontinued        1,250 mg 166.7 mL/hr over 90 Minutes Intravenous Every 24 hours 03/10/24 1056 03/10/24 1250   03/10/24 0945  azithromycin (ZITHROMAX) 500 mg in sodium chloride 0.9 % 250 mL IVPB        500 mg 250 mL/hr over 60 Minutes Intravenous Every 24 hours 03/10/24 0934 03/12/24 1024   03/10/24 0200  metroNIDAZOLE (FLAGYL) IVPB 500 mg  Status:  Discontinued        500 mg 100 mL/hr over 60 Minutes Intravenous Every 12 hours 03/09/24 1515 03/10/24 0933   03/10/24 0000  ceFEPIme (MAXIPIME) 2 g in sodium chloride 0.9 % 100 mL IVPB  Status:  Discontinued        2 g 200 mL/hr over 30 Minutes Intravenous Every 12 hours 03/09/24 1541 03/13/24 1148   03/09/24 1600  vancomycin (VANCOCIN) IVPB 1000 mg/200 mL premix        1,000 mg 200 mL/hr over 60 Minutes Intravenous  Once 03/09/24 1537 03/09/24 1834   03/09/24 1537  vancomycin variable dose per unstable renal function (pharmacist dosing)  Status:  Discontinued         Does not apply See admin instructions 03/09/24 1537 03/10/24 1056   03/09/24 1515  metroNIDAZOLE (FLAGYL) IVPB 500 mg  Status:  Discontinued        500 mg 100 mL/hr over 60 Minutes Intravenous Every 12 hours 03/09/24 1504 03/09/24 1515   03/09/24 1200  ceFEPIme (MAXIPIME) 2 g in sodium chloride 0.9 % 100 mL IVPB        2 g 200 mL/hr over 30 Minutes Intravenous  Once 03/09/24 1152 03/09/24 1304   03/09/24 1200  metroNIDAZOLE (FLAGYL) IVPB 500 mg        500 mg 100 mL/hr over 60 Minutes Intravenous  Once 03/09/24 1152 03/09/24 1505   03/09/24 1200  vancomycin (VANCOCIN) IVPB 1000 mg/200 mL premix        1,000 mg 200 mL/hr over 60 Minutes Intravenous  Once 03/09/24 1152 03/09/24 1523              Family Communication/Anticipated D/C date and plan/Code Status   DVT prophylaxis: enoxaparin (LOVENOX) injection 40 mg Start: 03/26/24 2200 Place TED hose Start: 03/09/24  1502     Code Status: Full Code  Family Communication: Plan discussed with Nettie Elm, wife, at the bedside bedside.  All her questions were answered. Disposition Plan: Plan to discharge to SNF   Status is: Inpatient Remains inpatient appropriate because: Persistent ileus       Subjective:   Interval events noted.  No complaints.  No nausea, vomiting or abdominal pain.  He feels a little better today.  His wife  was at the bedside.   Objective:    Vitals:   04/01/24 1739 04/01/24 2054 04/02/24 0155 04/02/24 0830  BP: (!) 123/57 (!) 128/58 122/65 116/66  Pulse: 68 62 65 (!) 58  Resp: 19 19 16 19   Temp:  98 F (36.7 C) 98.2 F (36.8 C) 97.6 F (36.4 C)  TempSrc:    Oral  SpO2: 97% 95% 94% 95%  Weight:      Height:       No data found.   Intake/Output Summary (Last 24 hours) at 04/02/2024 1529 Last data filed at 04/02/2024 1303 Gross per 24 hour  Intake --  Output 1425 ml  Net -1425 ml   Filed Weights   03/30/24 0349 03/31/24 0441 04/01/24 0500  Weight: 79.7 kg 81.6 kg 81.4 kg    Exam:    GEN: NAD SKIN: Warm and dry EYES: No pallor or icterus ENT: MMM, NG tube in place CV: RRR PULM: CTA B ABD: soft, mildly distended, NT, +BS CNS: AAO x 3, non focal EXT: No edema or tenderness    Data Reviewed:   I have personally reviewed following labs and imaging studies:  Labs: Labs show the following:   Basic Metabolic Panel: Recent Labs  Lab 03/27/24 0648 03/28/24 0843 03/29/24 0735 03/30/24 1232 04/01/24 0747 04/02/24 0503  NA 138 142 142 142 137 137  K 4.1 4.1 4.4 4.9 4.8 4.6  CL 106 109 111 113* 108 109  CO2 25 24 23  20* 22 23  GLUCOSE 348* 135* 156* 145* 147* 120*  BUN 39* 36* 34* 38* 30* 31*  CREATININE 1.32* 1.01 0.95 1.00 0.88 0.89  CALCIUM 8.6* 8.8* 9.1 9.3 9.1 8.7*  MG 2.3 2.4 2.3  --  2.1  --   PHOS 3.9 3.3 3.2  --  3.3  --    GFR Estimated Creatinine Clearance: 62.9 mL/min (by C-G formula based on SCr of 0.89 mg/dL). Liver  Function Tests: Recent Labs  Lab 03/28/24 0843 03/29/24 0735 03/30/24 1232 04/01/24 0747  AST 14* 14* 15 22  ALT 15 16 17 28   ALKPHOS 47 49 58 74  BILITOT 0.8 0.7 0.8 0.5  PROT 6.5 6.8 7.1 6.8  ALBUMIN 3.3* 3.3* 3.3* 2.9*   No results for input(s): "LIPASE", "AMYLASE" in the last 168 hours. No results for input(s): "AMMONIA" in the last 168 hours. Coagulation profile No results for input(s): "INR", "PROTIME" in the last 168 hours.  CBC: Recent Labs  Lab 03/27/24 0648 03/29/24 0735 04/02/24 0503  WBC 10.3 6.4 10.3  HGB 13.2 13.2 11.9*  HCT 40.7 41.1 37.3*  MCV 99.5 101.5* 102.2*  PLT 243 224 202   Cardiac Enzymes: No results for input(s): "CKTOTAL", "CKMB", "CKMBINDEX", "TROPONINI" in the last 168 hours. BNP (last 3 results) No results for input(s): "PROBNP" in the last 8760 hours. CBG: Recent Labs  Lab 04/01/24 1142 04/01/24 1737 04/01/24 2342 04/02/24 0538 04/02/24 1127  GLUCAP 140* 165* 136* 92 174*   D-Dimer: No results for input(s): "DDIMER" in the last 72 hours. Hgb A1c: No results for input(s): "HGBA1C" in the last 72 hours.  Lipid Profile: Recent Labs    04/01/24 0747  TRIG 121    Thyroid function studies: No results for input(s): "TSH", "T4TOTAL", "T3FREE", "THYROIDAB" in the last 72 hours.  Invalid input(s): "FREET3" Anemia work up: No results for input(s): "VITAMINB12", "FOLATE", "FERRITIN", "TIBC", "IRON", "RETICCTPCT" in the last 72 hours. Sepsis Labs: Recent Labs  Lab 03/27/24 [phone] 03/29/24 0735 04/02/24 0503  WBC 10.3 6.4 10.3    Microbiology No results found for this or any previous visit (from the past 240 hours).  Procedures and diagnostic studies:  DG ABD ACUTE 2+V W 1V CHEST Result Date: 04/02/2024 CLINICAL DATA:  [id number] Ileus following gastrointestinal surgery (HCC) [id number] EXAM: DG ABDOMEN ACUTE WITH 1 VIEW CHEST COMPARISON:  04/01/2024 FINDINGS: Mild elevation of right diaphragmatic leaflet as before. Right infrahilar  opacities persist. Left lung clear. No definite pleural effusion. Gastric tube into the decompressed stomach. No free air. Multiple dilated mid abdominal small bowel loops, similar in number and degree of dilatation since previous. The colon appears decompressed. There are no abnormal calcifications. Regional bones unremarkable.  Left shoulder DJD. IMPRESSION: 1. Persistent small bowel dilatation, suggesting  obstruction. 2. Gastric tube into the decompressed stomach. 3. Persistent right infrahilar opacities. Electronically Signed   By: Nicoletta Barrier M.D.   On: 04/02/2024 12:59   DG ABD ACUTE 2+V W 1V CHEST Result Date: 04/01/2024 CLINICAL DATA:  Postoperative ileus. 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 EXAM: DG ABDOMEN ACUTE WITH 1 VIEW CHEST COMPARISON:  Radiographs 03/30/2024 and 03/29/2024.  CT 03/25/2024. FINDINGS: Enteric tube has been removed. Right arm PICC projects to the superior cavoatrial junction. Persistent low lung volumes with right greater than left basilar atelectasis. No significant pleural effusion or pneumothorax. The heart size and mediastinal contours are stable. New mild gastric distension. Moderate diffuse small bowel distension has not significantly changed with multiple air-fluid levels on the erect examination. The colon appears decompressed. No evidence of pneumoperitoneum or acute osseous abnormality. IMPRESSION: 1. New mild gastric distension following enteric tube removal. 2. No significant change in moderate diffuse small bowel distension with multiple air-fluid levels, most consistent with postoperative ileus status post recent ileostomy. If concern for bowel obstruction, consider follow-up CT with enteric contrast. 3. Persistent low lung volumes with right greater than left basilar atelectasis. Electronically Signed   By: Elmon Hagedorn M.D.   On: 04/01/2024 12:35   DG Abd 1 View Result Date: 04/01/2024 CLINICAL DATA:   NG tube placement. EXAM: ABDOMEN - 1 VIEW COMPARISON:  Earlier same day. FINDINGS: NG tube tip is in the mid stomach with proximal side port below the GE junction. Moderate gaseous distention of the stomach noted despite NG tube placement. Gas-filled dilated small bowel loops noted in the visualized upper abdomen, measuring up to 4.2 cm diameter. There is right base collapse/consolidation with small right pleural effusion. The tip of a right PICC line overlies the mid SVC level. IMPRESSION: 1. NG tube tip is in the mid stomach. 2. Moderate gaseous distention of the stomach despite NG tube placement. 3. Gas-filled dilated small bowel loops in the visualized upper abdomen, similar to prior. Electronically Signed   By: Donnal Fusi M.D.   On: 04/01/2024 10:52                LOS: 24 days   Ravan Schlemmer  [facility]   Pager on [email or web address]. If 7PM-7AM, please contact night-coverage at [email or web address]     04/02/2024, 3:29 PM

## 2024-04-02 NOTE — Consult Note (Signed)
 WOC Nurse ostomy follow up Stoma type/location: RLQ, end ileostomy  Stomal assessment/size: 1 3/4" round, budded, pink and moist  Peristomal assessment: intact  Treatment options for stomal/peristomal skin: 2" skin barrier ring Output gelatinous; brown/green, + flatus  Ostomy pouching: 1pc. Flat with 2" barrier ring  Education provided:  Wife wanted to watch pouch change again. Demonstrated pouch change (wife cut new skin barrier, measuring stoma, cleaning peristomal skin and stoma, use of barrier ring) Education on emptying when 1/3 to 1/2 full and how to empty Demonstrated "burping" flatus from pouch Demonstrated use of wick to clean spout  Discussed bathing, diet, gas, medication use, constipation, diarrhea, dehydration  Discussed food blockage   Provided patient with [employer] and marked items currently using   Enrolled patient in [employer] Discharge program: Yes  Patient has NG again, he is engaged in learning self care.  Wife will assist at home. Planning DC to SNF. Provided words of encouragement today   WOC Nurse will follow along with you for continued support with ostomy teaching and care Addalynn Kumari [facility] MSN, RN, [place], CNS, [place] [phone]

## 2024-04-03 ENCOUNTER — Inpatient Hospital Stay

## 2024-04-03 DIAGNOSIS — A419 Sepsis, unspecified organism: Secondary | ICD-10-CM | POA: Diagnosis not present

## 2024-04-03 DIAGNOSIS — R6520 Severe sepsis without septic shock: Secondary | ICD-10-CM | POA: Diagnosis not present

## 2024-04-03 LAB — GLUCOSE, CAPILLARY
Glucose-Capillary: 128 mg/dL — ABNORMAL HIGH (ref 70–99)
Glucose-Capillary: 118 mg/dL — ABNORMAL HIGH (ref 70–99)
Glucose-Capillary: 121 mg/dL — ABNORMAL HIGH (ref 70–99)
Glucose-Capillary: 144 mg/dL — ABNORMAL HIGH (ref 70–99)
Glucose-Capillary: 132 mg/dL — ABNORMAL HIGH (ref 70–99)

## 2024-04-03 MED ORDER — ADULT MULTIVITAMIN W/MINERALS CH
1.0000 | ORAL_TABLET | Freq: Every day | ORAL | Status: DC
  Administered 2024-04-04 – 2024-04-05 (×2): 1
  Filled 2024-04-03 (×3): qty 1

## 2024-04-03 MED ORDER — ALLOPURINOL 100 MG PO TABS
300.0000 mg | ORAL_TABLET | Freq: Every day | ORAL | Status: DC
  Administered 2024-04-04 – 2024-04-05 (×2): 300 mg via NASOGASTRIC
  Filled 2024-04-03 (×3): qty 3

## 2024-04-03 MED ORDER — AMLODIPINE BESYLATE 5 MG PO TABS
5.0000 mg | ORAL_TABLET | Freq: Every day | ORAL | Status: DC
  Administered 2024-04-04 – 2024-04-05 (×2): 5 mg via NASOGASTRIC
  Filled 2024-04-03 (×3): qty 1

## 2024-04-03 MED ORDER — ASPIRIN 81 MG PO CHEW
81.0000 mg | CHEWABLE_TABLET | Freq: Every day | ORAL | Status: DC
  Administered 2024-04-04 – 2024-04-05 (×2): 81 mg via NASOGASTRIC
  Filled 2024-04-03 (×3): qty 1

## 2024-04-03 MED ORDER — TRAVASOL 10 % IV SOLN
INTRAVENOUS | Status: AC
  Filled 2024-04-03: qty 1056

## 2024-04-03 NOTE — Progress Notes (Signed)
 PHARMACY - TOTAL PARENTERAL NUTRITION CONSULT NOTE   Indication: Prolonged illeus  Patient Measurements: Height: 5\' 9"  (175.3 cm) Weight: 81.4 kg (179 lb 6.4 oz) IBW/kg (Calculated) : 70.7 TPN AdjBW (KG): 77.3 Body mass index is 26.49 kg/m.  Assessment: 83 y.o. 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   Glucose / Insulin:  BG 124-174 5u SSI last 24h A1C 6.5 Electrolytes: WNL Renal: SCr ~ 1, stable Hepatic: LFTs within normal limits TG: 121 Intake / Output;   Intake/Output Summary (Last 24 hours) at 04/03/2024 0739 Last data filed at 04/03/2024 8315 Gross per 24 hour  Intake 60 ml  Output 2075 ml  Net -2015 ml    GI Imaging:Serial KUB prn 4/7: abd CT: postoperative ileus  4/10: abd XR: Persistent small-bowel dilatation  GI Surgeries / Procedures:  4/2-s/p surgery-robotic assisted laparoscopic loop ileostomy with decompression of cecum for Ogilvie Syndrome,   Central access: 03/18/24 TPN start date: 03/18/24>>4/5, resumed 4/9  Nutritional Goals: Goal TPN rate is 80 mL/hr (provides 105.6 g of protein and 2042.9 kcals per day)  RD Assessment: Estimated Needs Total Energy Estimated Needs: 1900-2200kcal/day Total Protein Estimated Needs: 95-110g/day Total Fluid Estimated Needs: 1.9-2.2L/day  Current Nutrition: NPO  Plan:  Continue custom TPN at 80 mL/hr at 1800 Electrolytes in TPN: Na 63mEq/L, K 83mEq/L, Ca 61mEq/L, Mg 6mEq/L, and Phos 15mmol/L. Cl:Ac 1:1 Add standard MVI and trace elements to TPN Thiamine 100 mg IV daily x 7 days per dietary Continue Sensitive q6h SSI and adjust as needed  Monitor TPN labs on Mon/Thurs, and daily until stable  Adalberto Acton, PharmD Clinical Pharmacist 04/03/2024

## 2024-04-03 NOTE — Progress Notes (Signed)
 [facility] SURGICAL PROGRESS NOTE  Hospital Day(s): 25.   Post op day(s): 14 Days Post-Op.   Interval History:  Patient seen and examined Patient reports he is feeling better this morning Abdominal pain improved; less distended No new labs this morning  UO - 1825 ccs NGT output not recorded  Ileostomy with 575 ccs out recorded CT Abdomen/Pelvis (04/15) was reassuring  Vital signs in last 24 hours: [min-max] current  Temp:  [97.5 F (36.4 C)-98.2 F (36.8 C)] 97.5 F (36.4 C) (04/16 0432) Pulse Rate:  [58-78] 78 (04/16 0247) Resp:  [18-19] 18 (04/16 0247) BP: (116-130)/(59-66) 117/65 (04/16 0247) SpO2:  [95 %-96 %] 96 % (04/16 0247)     Height: 5\' 9"  (175.3 cm) Weight: 81.4 kg BMI (Calculated): 26.48   Intake/Output last 2 shifts:  04/15 0701 - 04/16 0700 In: 60 [NG/GT:60] Out: 2400 [Urine:1825; Stool:575]   Physical Exam:  Constitutional: alert, awake, NAD HEENT: NGT in place; <50 ccs in canister  Respiratory: breathing non-labored at rest  Cardiovascular: regular rate and sinus rhythm  Gastrointestinal: soft, he does not appear overtly tender, continues to be distended/tympanic, no rebound/guarding. Ileostomy in RLQ, stool and gas in bag this AM Integumentary: Laparoscopic incisions are CDI with dermabond   Labs:     Latest Ref Rng & Units 04/02/2024    5:03 AM 03/29/2024    7:35 AM 03/27/2024    6:48 AM  CBC  WBC 4.0 - 10.5 K/uL 10.3  6.4  10.3   Hemoglobin 13.0 - 17.0 g/dL 16.1  09.6  04.5   Hematocrit 39.0 - 52.0 % 37.3  41.1  40.7   Platelets 150 - 400 K/uL 202  224  243       Latest Ref Rng & Units 04/02/2024    5:03 AM 04/01/2024    7:47 AM 03/30/2024   12:32 PM  CMP  Glucose 70 - 99 mg/dL 409  811  914   BUN 8 - 23 mg/dL 31  30  38   Creatinine 0.61 - 1.24 mg/dL 7.82  9.56  2.13   Sodium 135 - 145 mmol/L 137  137  142   Potassium 3.5 - 5.1 mmol/L 4.6  4.8  4.9   Chloride 98 - 111 mmol/L 109  108  113   CO2 22 - 32 mmol/L 23  22  20     Calcium 8.9 - 10.3 mg/dL 8.7  9.1  9.3   Total Protein 6.5 - 8.1 g/dL  6.8  7.1   Total Bilirubin 0.0 - 1.2 mg/dL  0.5  0.8   Alkaline Phos 38 - 126 U/L  74  58   AST 15 - 41 U/L  22  15   ALT 0 - 44 U/L  28  17      Imaging studies:  CT Abdomen/Pelvis (04/02/2024) personally reviewed, and radiologist report reviewed below:  IMPRESSION: Diverting right-sided ileostomy. Two loops are brought out, 1 for the proximal ileum and 1 distal. The terminal ileum and colon are decompressed. There is persistent dilatation of the small bowel diffusely measuring up to 4.1 cm in diameter. Transition at the level of the ostomy. Oral contrast was administered and is seen opacifying the stomach and majority of the small bowel but the contrast has not yet reached the ostomy at the time of imaging. If needed delayed study could be obtained to see if the contrast reaches the ostomy. This could be done with an x-ray potentially.   No free  air or free fluid.   Lung base parenchymal opacity identified slightly increasing on the left. Atelectasis favored over infiltrate but recommend follow-up.   Question gallbladder sludge or stones.   Heterogeneous density to the prostate particularly peripheral zone on the right side posteriorly. This could be abnormal enhancement. Please correlate for any history of known malignancy and correlate with the patient's PSA. Further workup when clinically appropriate.   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 Favor a chronic process.   KUB (04/03/2024) personally reviewed, contrast throughout small bowel, air levels decreased, and radiologist report pending...    Assessment/Plan:  83 y.o. 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    - KUB with less gaseous distension of small bowel, noted contrast throughout. NGT output thinning and decreasing with increased ileostomy output. I do think he is slowly improving. I do think we need to remain patient. May consider NGT clamping trial in next 24 hours. May also benefit from initiation of CLD around clamped NGT although this is of course less ideal.    - Continue TPN; monitor electrolytes  - No need for emergent surgical intervention   - Monitor abdominal examination; on-going ileostomy function   - Pain control prn; minimize narcotics as feasible - Antiemetics prn   - Encouraged frequent mobilization as feasible - therapies following, walked 300 feet per notes  - Further management per primary service; we will follow   - Discharge Planning: Not ready for discharge, still with ileus   All of the above findings and recommendations were discussed with the patient, patient's family (wife at bedside), and the medical team, and all of their questions were answered to their expressed satisfaction.  -- Apolonio Bay, PA-C [facility] 04/03/2024, 7:37 AM M-F: 7am - 4pm

## 2024-04-03 NOTE — Progress Notes (Signed)
 Progress Note    Todd Rojas  ZOX:[id number] DOB: 1941-10-07  DOA: 03/09/2024 PCP: Barbette Reichmann, MD      Brief Narrative:    Medical records reviewed and are as summarized below:  "Todd Rojas is a 83 y.o. male  with history of CKD stage IIIa, chronic gout, hypertension, hyperlipidemia, interstitial pulmonary disease, bronchiectasis, COPD, who presents emergency department for chief concerns of fever, chills, shortness of breath for 2 weeks.   Vitals in the ED showed temperature of 98.4, respiration rate 25, heart rate 94, blood pressure initially 96/69 and improved to 107/66, SpO2 initially desatted to 88% on room air   Status post exploratory laparotomy with robotic assisted laparoscopic loop ileostomy and decompression of cecum on 4/2.  Tolerated procedure well.  No ischemic gut noted   4/7: Worsening nausea.  Inability to tolerate p.o.  Repeat KUB demonstrating ileus.  General surgery reengaged.  Ordering CT abdomen.   4/8: Conservative management per surgery.  No evidence of obstruction.  CT reassuring.  Diet scaled back to n.p.o. for now.  Clear liquids later today if stable."   Further hospital course and management as outlined below.  I assumed care on 04/03/24. NG tube in place for persistent ileus.  Surgery continues to follow closely.     Assessment/Plan:   Principal Problem:   Severe sepsis with acute organ dysfunction (HCC) Active Problems:   Fever   Acute hypoxic respiratory failure (HCC)   CAD (coronary artery disease)   Elevated troponin   Gaseous abdominal distention   COPD (chronic obstructive pulmonary disease) (HCC)   Elevated serum creatinine   Hypertension   Hyperlipidemia, unspecified   Primary osteoarthritis of both knees   Thyroid nodule greater than or equal to 1.5 cm in diameter incidentally noted on imaging study   Dyslipidemia   Personal history of gout   Ogilvie syndrome   Protein-calorie malnutrition,  severe   Nutrition Problem: Severe Malnutrition Etiology: acute illness  Signs/Symptoms: mild fat depletion, moderate fat depletion, mild muscle depletion, moderate muscle depletion   Body mass index is 26.49 kg/m.   Gaseous abdominal distention 2/2 Colonic ileus  Ogilvie's syndrome Patient developed abdominal distention and crampy abdominal pain after presentation. Starting 3/25, Imaging with right colonic gaseous distention without any obvious obstruction.  Colonic distention has been worsening. --GenSurg and GI consulted, dx with Ogilvie's syndrome --neostigmine 2 mg push morning of 3/29 with [name] and [name] present with passing of gas and BM afterwards, however, colonic distention unchanged, so pt underwent endoscopic decompression on 3/30.  Unfortunately colonic gas re-accumulated. --started TPN on 3/31 Robotic assisted laparoscopic loop ileostomy with decompression of cecum on 03/20/2024 --Was clinically improving then had deterioration on 4/7.  He was made n.p.o. and was subsequently transitioned to clear liquid diet. He had abdominal pain and vomiting on 03/27/2024 so NGT was replaced. NG tube successfully removed on 03/31/2024 He had recurrent nausea and vomiting on 04/01/2024.  Abdominal x-ray showed persistent gaseous distention so NG tube was reinserted on 04/01/2024. Abdominal x-ray 04/02/2024 showed persistent small bowel dilatation suggesting obstruction.  CT abdomen pelvis ordered for further evaluation. Started on TPN for nutrition on 03/27/2024.  Continue TPN. Discontinue IV fluids. Continue ambulation as needed.  Follow-up with general surgery   Bradycardia, recurrent Initially noted secondary to neostigmine as administration.  Heart rate was down to 20s.  Patient received atropine.  Heart rate improved.  On 4/3 noted to be bradycardic, asymptomatic with heart rate in  the 40s to 50s.  Beta-blockade was held. Use IV metoprolol as needed    Severe sepsis with  acute organ dysfunction (HCC) 2/2 PNA Elevated lactic acid of 2.6, increased respiration rate, with fever of 102 F at home, organ involvement are cardiac and pulmonary with hypoxic respiratory failure at 88% on room air, elevated high sensitive troponin of 98 and elevated BNP --Imaging concerning for right lower lobe pneumonia, although not much upper respiratory symptoms.  Procalcitonin at 0.352. Respiratory panels were negative. UA was not consistent with UTI Patient received broad-spectrum antibiotics Completed antibiotic course    Acute hypoxic respiratory failure (HCC) Resolved.  He is tolerating room air    Elevated troponin 2/2 demand ischemia First high sensitivity troponin was elevated at 98 >>101>175>226,  Suspect secondary to demand ischemia in setting of severe sepsis with acute hypoxic respiratory failure Echocardiogram with EF of 40% and regional wall motion abnormalities, discussed with cardiology and they think these abnormalities are chronic. Patient was also experiencing intermittent chest pain over the past couple of months which seems more exertional --cardio consulted, no plan for cardiac workup    CAD (coronary artery disease) With history of anterior STEMI in 2006, transferred to [facility] status post cardiac catheterization with PCI to mid LAD using Cypher stent; currently no longer on Plavix Continue aspirin and statin    COPD (chronic obstructive pulmonary disease) (HCC) Bronchodilators as needed   Elevated serum creatinine Creatinine is stable.  Does not meet acute kidney injury criteria, suspect prerenal in setting of nausea vomiting Serum creatinine baseline is 1.35-1.36/eGFR 52-55, consistent with CKD stage III A Monitor BMP.  Minimize use of nephrotoxins     Thyroid nodule greater than or equal to 1.5 cm in diameter incidentally noted on imaging study Incidental finding of a 2.7 cm thyroid nodule. -Thyroid ultrasound with solitary nodule.  Outpatient  biopsy recommended --TSH wnl    Rash Developed morning of 3/28, all over chest, back and bilateral thighs.  Appeared to be allergic rxn.  Rash improved with prednisone.  Completed 5 days of prednisone 40 mg daily.   Type II DM, hyperglycemia:  NovoLog as needed for hyperglycemia.  Hemoglobin A1c 6.5    Comorbidities include hypertension, hyperlipidemia, osteoarthritis of both knees s/p bilateral knee arthroplasty with mild right knee effusion, gout    Diet Order             Diet NPO time specified Except for: Ice Chips  Diet effective now                    Consultants: General surgery Gastroenterology Cardiology  Procedures: Flexible sigmoidoscopy on 03/17/2024 Robotic assisted laparoscopic loop ileostomy with decompression of cecum on 03/20/2024    Medications:    allopurinol  300 mg Per NG tube Daily   amLODipine  5 mg Per NG tube Daily   aspirin  81 mg Per NG tube Daily   Chlorhexidine Gluconate Cloth  6 each Topical Daily   diclofenac Sodium  2 g Topical TID   enoxaparin (LOVENOX) injection  40 mg Subcutaneous Q24H   insulin aspart  0-9 Units Subcutaneous Q6H   mometasone-formoterol  2 puff Inhalation BID   multivitamin with minerals  1 tablet Per Tube Daily   pantoprazole (PROTONIX) IV  40 mg Intravenous Daily   sodium chloride flush  10-40 mL Intracatheter Q12H   Continuous Infusions:  promethazine (PHENERGAN) injection (IM or IVPB) 12.5 mg (04/02/24 2341)   TPN ADULT (ION) 80 mL/hr at  04/03/24 0400   TPN ADULT (ION)       Anti-infectives (From admission, onward)    Start     Dose/Rate Route Frequency Ordered Stop   03/20/24 1315  cefoTEtan (CEFOTAN) 2 g in sodium chloride 0.9 % 100 mL IVPB  Status:  Discontinued        2 g 200 mL/hr over 30 Minutes Intravenous  Once 03/20/24 1228 03/24/24 0924   03/20/24 0830  cefoTEtan (CEFOTAN) 2 g in sodium chloride 0.9 % 100 mL IVPB        2 g 200 mL/hr over 30 Minutes Intravenous  Once 03/19/24 1739  03/20/24 0825   03/13/24 1200  levofloxacin (LEVAQUIN) tablet 750 mg        750 mg Oral Daily 03/13/24 1148 03/15/24 1413   03/10/24 1600  vancomycin (VANCOREADY) IVPB 1250 mg/250 mL  Status:  Discontinued        1,250 mg 166.7 mL/hr over 90 Minutes Intravenous Every 24 hours 03/10/24 1056 03/10/24 1250   03/10/24 0945  azithromycin (ZITHROMAX) 500 mg in sodium chloride 0.9 % 250 mL IVPB        500 mg 250 mL/hr over 60 Minutes Intravenous Every 24 hours 03/10/24 0934 03/12/24 1024   03/10/24 0200  metroNIDAZOLE (FLAGYL) IVPB 500 mg  Status:  Discontinued        500 mg 100 mL/hr over 60 Minutes Intravenous Every 12 hours 03/09/24 1515 03/10/24 0933   03/10/24 0000  ceFEPIme (MAXIPIME) 2 g in sodium chloride 0.9 % 100 mL IVPB  Status:  Discontinued        2 g 200 mL/hr over 30 Minutes Intravenous Every 12 hours 03/09/24 1541 03/13/24 1148   03/09/24 1600  vancomycin (VANCOCIN) IVPB 1000 mg/200 mL premix        1,000 mg 200 mL/hr over 60 Minutes Intravenous  Once 03/09/24 1537 03/09/24 1834   03/09/24 1537  vancomycin variable dose per unstable renal function (pharmacist dosing)  Status:  Discontinued         Does not apply See admin instructions 03/09/24 1537 03/10/24 1056   03/09/24 1515  metroNIDAZOLE (FLAGYL) IVPB 500 mg  Status:  Discontinued        500 mg 100 mL/hr over 60 Minutes Intravenous Every 12 hours 03/09/24 1504 03/09/24 1515   03/09/24 1200  ceFEPIme (MAXIPIME) 2 g in sodium chloride 0.9 % 100 mL IVPB        2 g 200 mL/hr over 30 Minutes Intravenous  Once 03/09/24 1152 03/09/24 1304   03/09/24 1200  metroNIDAZOLE (FLAGYL) IVPB 500 mg        500 mg 100 mL/hr over 60 Minutes Intravenous  Once 03/09/24 1152 03/09/24 1505   03/09/24 1200  vancomycin (VANCOCIN) IVPB 1000 mg/200 mL premix        1,000 mg 200 mL/hr over 60 Minutes Intravenous  Once 03/09/24 1152 03/09/24 1523          Family Communication/Anticipated D/C date and plan/Code Status   DVT prophylaxis:  enoxaparin (LOVENOX) injection 40 mg Start: 03/26/24 2200 Place TED hose Start: 03/09/24 1502     Code Status: Full Code  Family Communication: Plan discussed with Lamond Pilot, wife, at the bedside bedside.  All her questions were answered. Disposition Plan: Plan to discharge to SNF   Status is: Inpatient Remains inpatient appropriate because: Persistent ileus NPO with NG tube in place   Subjective:   Pt awake sitting up in bed when seen this AM.  He  got very nauseous when tube was clamped earlier for mobility.  He reports it is starting to ease off now.  RN held AM meds.  Pt denies other acute complaints.    Objective:    Vitals:   04/03/24 0247 04/03/24 0432 04/03/24 0812 04/03/24 1519  BP: 117/65  (!) 109/59 129/60  Pulse: 78  (!) 53 69  Resp: 18  16 16   Temp: 98.2 F (36.8 C) (!) 97.5 F (36.4 C) 98.2 F (36.8 C) 97.8 F (36.6 C)  TempSrc:      SpO2: 96%  96% 95%  Weight:      Height:       No data found.   Intake/Output Summary (Last 24 hours) at 04/03/2024 1545 Last data filed at 04/03/2024 0730 Gross per 24 hour  Intake 60 ml  Output 2375 ml  Net -2315 ml   Filed Weights   03/30/24 0349 03/31/24 0441 04/01/24 0500  Weight: 79.7 kg 81.6 kg 81.4 kg    Exam:    General exam: awake, alert, no acute distress HEENT: moist mucus membranes, hearing grossly normal  Respiratory system: CTAB diminished due to shallow inspiratory volumes, no wheezes, normal respiratory effort. Cardiovascular system: normal S1/S2, RRR, no JVD, murmurs, rubs, gallops, no pedal edema.   Gastrointestinal system: ostomy bag present, surgical incisions appear healthy, soft and non-distended Central nervous system: A&O x 3. no gross focal neurologic deficits, normal speech Extremities: moves all, no edema, normal tone Skin: dry, intact, normal temperature Psychiatry: normal mood, congruent affect, judgement and insight appear normal     Data Reviewed:   I have personally reviewed  following labs and imaging studies:  Labs: Labs show the following:   Basic Metabolic Panel: Recent Labs  Lab 03/28/24 0843 03/29/24 0735 03/30/24 1232 04/01/24 0747 04/02/24 0503  NA 142 142 142 137 137  K 4.1 4.4 4.9 4.8 4.6  CL 109 111 113* 108 109  CO2 24 23 20* 22 23  GLUCOSE 135* 156* 145* 147* 120*  BUN 36* 34* 38* 30* 31*  CREATININE 1.01 0.95 1.00 0.88 0.89  CALCIUM 8.8* 9.1 9.3 9.1 8.7*  MG 2.4 2.3  --  2.1  --   PHOS 3.3 3.2  --  3.3  --    GFR Estimated Creatinine Clearance: 62.9 mL/min (by C-G formula based on SCr of 0.89 mg/dL). Liver Function Tests: Recent Labs  Lab 03/28/24 0843 03/29/24 0735 03/30/24 1232 04/01/24 0747  AST 14* 14* 15 22  ALT 15 16 17 28   ALKPHOS 47 49 58 74  BILITOT 0.8 0.7 0.8 0.5  PROT 6.5 6.8 7.1 6.8  ALBUMIN 3.3* 3.3* 3.3* 2.9*   No results for input(s): "LIPASE", "AMYLASE" in the last 168 hours. No results for input(s): "AMMONIA" in the last 168 hours. Coagulation profile No results for input(s): "INR", "PROTIME" in the last 168 hours.  CBC: Recent Labs  Lab 03/29/24 0735 04/02/24 0503  WBC 6.4 10.3  HGB 13.2 11.9*  HCT 41.1 37.3*  MCV 101.5* 102.2*  PLT 224 202   Cardiac Enzymes: No results for input(s): "CKTOTAL", "CKMB", "CKMBINDEX", "TROPONINI" in the last 168 hours. BNP (last 3 results) No results for input(s): "PROBNP" in the last 8760 hours. CBG: Recent Labs  Lab 04/02/24 1842 04/02/24 2356 04/03/24 0621 04/03/24 0811 04/03/24 1117  GLUCAP 124* 129* 128* 118* 121*   D-Dimer: No results for input(s): "DDIMER" in the last 72 hours. Hgb A1c: No results for input(s): "HGBA1C" in the last 72 hours.  Lipid Profile: Recent Labs    04/01/24 0747  TRIG 121    Thyroid function studies: No results for input(s): "TSH", "T4TOTAL", "T3FREE", "THYROIDAB" in the last 72 hours.  Invalid input(s): "FREET3" Anemia work up: No results for input(s): "VITAMINB12", "FOLATE", "FERRITIN", "TIBC", "IRON",  "RETICCTPCT" in the last 72 hours. Sepsis Labs: Recent Labs  Lab 03/29/24 0735 04/02/24 0503  WBC 6.4 10.3    Microbiology No results found for this or any previous visit (from the past 240 hours).  Procedures and diagnostic studies:  DG ABD ACUTE 2+V W 1V CHEST Result Date: 04/03/2024 CLINICAL DATA:  Ileus after gastrointestinal surgery EXAM: DG ABDOMEN ACUTE WITH 1 VIEW CHEST COMPARISON:  Yesterday FINDINGS: Low volume chest with elevated right diaphragm and overlying atelectasis or scar. Contrast is seen within the bladder and within bowel loops. Bowel distension has improved. An enteric tube tip and side-port reaches the collapsed stomach. Right PICC with tip at the SVC. IMPRESSION: Improving bowel distension, although still abnormal with dilute contrast seen from CT yesterday. Electronically Signed   By: Ronnette Coke M.D.   On: 04/03/2024 11:10   CT ABDOMEN PELVIS W CONTRAST Result Date: 04/02/2024 CLINICAL DATA:  Bowel obstruction. Prior laparoscopic loop ileostomy related Ogilvie's disease. EXAM: CT ABDOMEN AND PELVIS WITH CONTRAST TECHNIQUE: Multidetector CT imaging of the abdomen and pelvis was performed using the standard protocol following bolus administration of intravenous contrast. RADIATION DOSE REDUCTION: This exam was performed according to the departmental dose-optimization program which includes automated exposure control, adjustment of the mA and/or kV according to patient size and/or use of iterative reconstruction technique. CONTRAST:  100mL OMNIPAQUE IOHEXOL 300 MG/ML  SOLN COMPARISON:  X-ray 04/02/2024 of the abdomen and older. CT 03/25/2024. FINDINGS: Lower chest: Persistent nodular bandlike opacities along the lung bases. Slightly increasing on the left. Similar on the right. Atelectasis versus infiltrate. Recommend follow-up. No pleural effusion. There is breathing motion. Coronary artery calcifications are identified. Enteric tube extends into the stomach. Slightly  patulous lower esophagus. Slight elevation of the right hemidiaphragm. Hepatobiliary: Gallbladder has some slight increased density in the lumen. Sludge or small stones are in the differential. Patent portal vein. Tiny segment 3 low-attenuation liver lesion. Unchanged from previous but too small to completely characterize. Statistically likely a benign cystic lesion. No new space-occupying liver lesion at this time. Pancreas: Moderate global atrophy of the pancreas without mass. Spleen: Normal in size without focal abnormality. Adrenals/Urinary Tract: Adrenal glands are preserved. Moderate atrophy of the left kidney. Slight right. Stable 14 mm lower pole anterior right-sided renal cyst. No enhancing renal mass. Mild nonspecific perinephric stranding. No collecting system dilatation. The right ureter has a normal course and caliber. There is ectasia of the distal left ureter, unchanged from previous. Again no delayed enhancement excretion from the left kidney. This could be a patulous for chronic process including a megaureter. Please correlate for any history of chronic reflux. Preserved contour to the urinary bladder. Stomach/Bowel: Large bowel is decompressed. Few left-sided colonic diverticula. Normal retrocecal appendix. Stomach is nondilated. Diverting right-sided ostomy at the level of the pelvis. There is of training mucous fistula along the terminal ileum which is decompressed. The small bowel is diffusely mildly dilated with air-fluid levels. There is some oral contrast administered in the stomach and majority of the small bowel but the contrast does not extend all the way to the ostomy. The dilatation has diameter approaching up to 4.1 cm. The dilatation extends to the level of the exiting loop at the  ostomy. Please see coronal series 5, image 20. Vascular/Lymphatic: Normal caliber aorta and IVC with some atherosclerotic changes. Retroaortic left renal vein. No specific abnormal lymph node enlargement  identified in the abdomen and pelvis. Reproductive: Enlarged prostate. There is a heterogeneous area of higher density along the peripheral zone of the right side of the prostate. Possibilities would include abnormal enhancement. Please correlate for the patient's PSA to assess for potential neoplasm. Other: No free intra-air.  No free fluid. Musculoskeletal: Curvature and degenerative changes along the spine. Degenerative changes along the pelvis. Focal lipoma along the left flank musculature on series 2, image 58. IMPRESSION: Diverting right-sided ileostomy. Two loops are brought out, 1 for the proximal ileum and 1 distal. The terminal ileum and colon are decompressed. There is persistent dilatation of the small bowel diffusely measuring up to 4.1 cm in diameter. Transition at the level of the ostomy. Oral contrast was administered and is seen opacifying the stomach and majority of the small bowel but the contrast has not yet reached the ostomy at the time of imaging. If needed delayed study could be obtained to see if the contrast reaches the ostomy. This could be done with an x-ray potentially. No free air or free fluid. Lung base parenchymal opacity identified slightly increasing on the left. Atelectasis favored over infiltrate but recommend follow-up. Question gallbladder sludge or stones. Heterogeneous density to the prostate particularly peripheral zone on the right side posteriorly. This could be abnormal enhancement. Please correlate for any history of known malignancy and correlate with the patient's PSA. Further workup when clinically appropriate. 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 Favor a chronic process. Electronically Signed   By: Adrianna Horde M.D.   On: 04/02/2024 17:48   DG ABD ACUTE 2+V W 1V CHEST Result Date: 04/02/2024 CLINICAL DATA:  [id number] Ileus following gastrointestinal surgery (HCC)  [id number] EXAM: DG ABDOMEN ACUTE WITH 1 VIEW CHEST COMPARISON:  04/01/2024 FINDINGS: Mild elevation of right diaphragmatic leaflet as before. Right infrahilar opacities persist. Left lung clear. No definite pleural effusion. Gastric tube into the decompressed stomach. No free air. Multiple dilated mid abdominal small bowel loops, similar in number and degree of dilatation since previous. The colon appears decompressed. There are no abnormal calcifications. Regional bones unremarkable.  Left shoulder DJD. IMPRESSION: 1. Persistent small bowel dilatation, suggesting  obstruction. 2. Gastric tube into the decompressed stomach. 3. Persistent right infrahilar opacities. Electronically Signed   By: Nicoletta Barrier M.D.   On: 04/02/2024 12:59                LOS: 25 days   Montey Apa  [facility]   Pager on [email or web address]. If 7PM-7AM, please contact night-coverage at [email or web address]     04/03/2024, 3:45 PM

## 2024-04-03 NOTE — Progress Notes (Signed)
 Mobility Specialist - Progress Note   04/03/24 1130  Mobility  Activity Ambulated with assistance in hallway  Level of Assistance Standby assist, set-up cues, supervision of patient - no hands on  Assistive Device Front wheel walker  Distance Ambulated (ft) 160 ft  Activity Response Tolerated well  Mobility visit 1 Mobility  Mobility Specialist Start Time (ACUTE ONLY) 1058  Mobility Specialist Stop Time (ACUTE ONLY) 1125  Mobility Specialist Time Calculation (min) (ACUTE ONLY) 27 min   Pt supine upon entry, utilizing RA. Pt agreeable to OOB amb this date, reported feeling fair this date. Pt completed bed mob ModI, STS to RW CGA and amb one lap around the NS MinG-Supervision-- ~140 ft into amb Pt reported his "stomach feeling wheezy" and stated "I might throw up", prompting a return to the room. Pt returned to the room, stood at bedside for ~70mins while RN empties ostomy bag. Pt returned to bed, left semi fowler with alarm set and needs within reach. Family at bedside.  Todd Rojas Mobility Specialist 04/03/24 11:40 AM

## 2024-04-03 NOTE — Progress Notes (Signed)
 Physical Therapy Treatment Patient Details Name: Todd Rojas MRN: [id number] DOB: 05/11/1941 Today's Date: 04/03/2024   History of Present Illness Pt is an 83 year old male   MD work up including sepsis, acute hypoxic respiratory failure, PNA    Pmh significant for CKD stage IIIa, chronic gout, hypertension, hyperlipidemia, interstitial pulmonary disease, bronchiectasis, COPD. Pt is s/p exploratory lapileostomy and decompression of cecum on 4/2.    PT Comments  Pt received upright in bed agreeable to PT. Reliant on minA for side lying to sit at shoulder but supervision  to STS and ambulate > 300' with step through gait and good cadence. Pt and spouse agreeable to updating recs to home with HHPT once medically appropriate. Pt returns to room sitting EoB and returns to supine at supervision level.    If plan is discharge home, recommend the following: Assist for transportation;Help with stairs or ramp for entrance;A little help with bathing/dressing/bathroom;A little help with walking and/or transfers;Assistance with cooking/housework   Can travel by private vehicle     Yes  Equipment Recommendations  Rolling walker (2 wheels);BSC/3in1    Recommendations for Other Services       Precautions / Restrictions Precautions Precautions: Fall Recall of Precautions/Restrictions: Intact Restrictions Weight Bearing Restrictions Per Provider Order: No     Mobility  Bed Mobility Overal bed mobility: Needs Assistance Bed Mobility: Supine to Sit     Supine to sit: Min assist, HOB elevated     General bed mobility comments: minA at shoulders once in side lying to sit EOB Patient Response: Cooperative  Transfers Overall transfer level: Needs assistance   Transfers: Sit to/from Stand Sit to Stand: Supervision                Ambulation/Gait Ambulation/Gait assistance: Supervision Gait Distance (Feet): 370 Feet Assistive device: Rolling walker (2 wheels) Gait  Pattern/deviations: Step-through pattern, Decreased stride length           Stairs             Wheelchair Mobility     Tilt Bed Tilt Bed Patient Response: Cooperative  Modified Rankin (Stroke Patients Only)       Balance Overall balance assessment: Mild deficits observed, not formally tested                                          Communication    Cognition Arousal: Alert Behavior During Therapy: Flat affect   PT - Cognitive impairments: No apparent impairments                         Following commands: Intact Following commands impaired: Follows one step commands with increased time, Only follows one step commands consistently    Cueing Cueing Techniques: Verbal cues, Tactile cues  Exercises      General Comments        Pertinent Vitals/Pain Pain Assessment Pain Assessment: Faces Faces Pain Scale: Hurts a little bit Pain Location: abdomen Pain Descriptors / Indicators: Sore, Discomfort Pain Intervention(s): Limited activity within patient's tolerance, Monitored during session    Home Living                          Prior Function            PT Goals (current goals can now be found in the  care plan section) Acute Rehab PT Goals Patient Stated Goal: to feel better PT Goal Formulation: With patient Time For Goal Achievement: 04/04/24 Potential to Achieve Goals: Good Progress towards PT goals: Progressing toward goals    Frequency    Min 2X/week      PT Plan      Co-evaluation              AM-PAC PT "6 Clicks" Mobility   Outcome Measure  Help needed turning from your back to your side while in a flat bed without using bedrails?: A Little Help needed moving from lying on your back to sitting on the side of a flat bed without using bedrails?: A Little Help needed moving to and from a bed to a chair (including a wheelchair)?: A Little Help needed standing up from a chair using your arms  (e.g., wheelchair or bedside chair)?: A Little Help needed to walk in hospital room?: A Little Help needed climbing 3-5 steps with a railing? : A Lot 6 Click Score: 17    End of Session Equipment Utilized During Treatment: Gait belt Activity Tolerance: Patient tolerated treatment well Patient left: in bed;with call bell/phone within reach;with bed alarm set Nurse Communication: Mobility status PT Visit Diagnosis: Other abnormalities of gait and mobility (R26.89);Difficulty in walking, not elsewhere classified (R26.2);Muscle weakness (generalized) (M62.81)     Time: 1610-9604 PT Time Calculation (min) (ACUTE ONLY): 20 min  Charges:    $Therapeutic Activity: 8-22 mins PT General Charges $$ ACUTE PT VISIT: 1 Visit                     [name]. Fairly IV, PT, DPT Physical Therapist- [place]  [facility]  04/03/2024, 1:55 PM

## 2024-04-03 NOTE — TOC Progression Note (Signed)
 Transition of Care [facility]) - Progression Note    Patient Details  Name: Todd Rojas MRN: [id number] Date of Birth: [birth year]  Transition of Care [facility] CM/SW Contact  Loman Risk, RN Phone Number: 04/03/2024, 2:41 PM  Clinical Narrative:    Patient still with ileus and TPN Plan remains for [facility] for STR at discharge and will require auth   Expected Discharge Plan: Skilled Nursing Facility Barriers to Discharge: Continued Medical Work up  Expected Discharge Plan and Services     Post Acute Care Choice:  (TBD) Living arrangements for the past 2 months: Single Family Home                                       Social Determinants of Health (SDOH) Interventions SDOH Screenings   Food Insecurity: No Food Insecurity (03/10/2024)  Housing: Low Risk  (03/10/2024)  Transportation Needs: No Transportation Needs (03/10/2024)  Utilities: Not At Risk (03/10/2024)  Depression (PHQ2-9): Low Risk  (07/12/2021)  Financial Resource Strain: Low Risk  (10/23/2023)   Received from [facility]  Social Connections: Socially Integrated (03/10/2024)  Tobacco Use: Medium Risk (03/25/2024)    Readmission Risk Interventions     No data to display

## 2024-04-04 DIAGNOSIS — R6520 Severe sepsis without septic shock: Secondary | ICD-10-CM | POA: Diagnosis not present

## 2024-04-04 DIAGNOSIS — A419 Sepsis, unspecified organism: Secondary | ICD-10-CM | POA: Diagnosis not present

## 2024-04-04 LAB — GLUCOSE, CAPILLARY
Glucose-Capillary: 122 mg/dL — ABNORMAL HIGH (ref 70–99)
Glucose-Capillary: 126 mg/dL — ABNORMAL HIGH (ref 70–99)
Glucose-Capillary: 127 mg/dL — ABNORMAL HIGH (ref 70–99)
Glucose-Capillary: 129 mg/dL — ABNORMAL HIGH (ref 70–99)
Glucose-Capillary: 136 mg/dL — ABNORMAL HIGH (ref 70–99)

## 2024-04-04 LAB — COMPREHENSIVE METABOLIC PANEL WITH GFR
ALT: 21 U/L (ref 0–44)
AST: 15 U/L (ref 15–41)
Albumin: 2.6 g/dL — ABNORMAL LOW (ref 3.5–5.0)
Alkaline Phosphatase: 63 U/L (ref 38–126)
Anion gap: 6 (ref 5–15)
BUN: 26 mg/dL — ABNORMAL HIGH (ref 8–23)
CO2: 23 mmol/L (ref 22–32)
Calcium: 8.6 mg/dL — ABNORMAL LOW (ref 8.9–10.3)
Chloride: 105 mmol/L (ref 98–111)
Creatinine, Ser: 0.9 mg/dL (ref 0.61–1.24)
GFR, Estimated: >60 mL/min (ref >60)
Glucose, Bld: 124 mg/dL — ABNORMAL HIGH (ref 70–99)
Potassium: 4.5 mmol/L (ref 3.5–5.1)
Sodium: 134 mmol/L — ABNORMAL LOW (ref 135–145)
Total Bilirubin: 0.4 mg/dL (ref 0.0–1.2)
Total Protein: 6 g/dL — ABNORMAL LOW (ref 6.5–8.1)

## 2024-04-04 LAB — MAGNESIUM: Magnesium: 2 mg/dL (ref 1.7–2.4)

## 2024-04-04 LAB — PHOSPHORUS: Phosphorus: 3.5 mg/dL (ref 2.5–4.6)

## 2024-04-04 MED ORDER — GUAIFENESIN ER 600 MG PO TB12
600.0000 mg | ORAL_TABLET | Freq: Two times a day (BID) | ORAL | Status: DC
  Administered 2024-04-04 – 2024-04-09 (×11): 600 mg via ORAL
  Filled 2024-04-04 (×11): qty 1

## 2024-04-04 MED ORDER — TRAVASOL 10 % IV SOLN
INTRAVENOUS | Status: AC
  Filled 2024-04-04: qty 1056

## 2024-04-04 NOTE — Plan of Care (Signed)
 Received report, assessment completed. Reposition q2hrs, continue to monitor. Slept 4-6 hrs.

## 2024-04-04 NOTE — Progress Notes (Signed)
 Progress Note    Todd Rojas  ZOX:[id number] DOB: November 11, 1941  DOA: 03/09/2024 PCP: Barbette Reichmann, MD      Brief Narrative:    Medical records reviewed and are as summarized below:  "Todd Rojas is a 83 y.o. male  with history of CKD stage IIIa, chronic gout, hypertension, hyperlipidemia, interstitial pulmonary disease, bronchiectasis, COPD, who presents emergency department for chief concerns of fever, chills, shortness of breath for 2 weeks.   Vitals in the ED showed temperature of 98.4, respiration rate 25, heart rate 94, blood pressure initially 96/69 and improved to 107/66, SpO2 initially desatted to 88% on room air   Status post exploratory laparotomy with robotic assisted laparoscopic loop ileostomy and decompression of cecum on 4/2.  Tolerated procedure well.  No ischemic gut noted   4/7: Worsening nausea.  Inability to tolerate p.o.  Repeat KUB demonstrating ileus.  General surgery reengaged.  Ordering CT abdomen.   4/8: Conservative management per surgery.  No evidence of obstruction.  CT reassuring.  Diet scaled back to n.p.o. for now.  Clear liquids later today if stable."   Further hospital course and management as outlined below.  I assumed care on 04/03/24. NG tube in place for persistent ileus.  Surgery continues to follow closely.     Assessment/Plan:   Principal Problem:   Severe sepsis with acute organ dysfunction (HCC) Active Problems:   Fever   Acute hypoxic respiratory failure (HCC)   CAD (coronary artery disease)   Elevated troponin   Gaseous abdominal distention   COPD (chronic obstructive pulmonary disease) (HCC)   Elevated serum creatinine   Hypertension   Hyperlipidemia, unspecified   Primary osteoarthritis of both knees   Thyroid nodule greater than or equal to 1.5 cm in diameter incidentally noted on imaging study   Dyslipidemia   Personal history of gout   Ogilvie syndrome   Protein-calorie malnutrition,  severe   Nutrition Problem: Severe Malnutrition Etiology: acute illness  Signs/Symptoms: mild fat depletion, moderate fat depletion, mild muscle depletion, moderate muscle depletion   Body mass index is 26.49 kg/m.   Gaseous abdominal distention 2/2 Colonic ileus  Ogilvie's syndrome Patient developed abdominal distention and crampy abdominal pain after presentation. Starting 3/25, Imaging with right colonic gaseous distention without any obvious obstruction.  Colonic distention has been worsening. --GenSurg and GI consulted, dx with Ogilvie's syndrome --neostigmine 2 mg push morning of 3/29 with [name] and [name] present with passing of gas and BM afterwards, however, colonic distention unchanged, so pt underwent endoscopic decompression on 3/30.  Unfortunately colonic gas re-accumulated. --started TPN on 3/31 Robotic assisted laparoscopic loop ileostomy with decompression of cecum on 03/20/2024 --Was clinically improving then had deterioration on 4/7.  He was made n.p.o. and was subsequently transitioned to clear liquid diet. He had abdominal pain and vomiting on 03/27/2024 so NGT was replaced. NG tube successfully removed on 03/31/2024 He had recurrent nausea and vomiting on 04/01/2024.  Abdominal x-ray showed persistent gaseous distention so NG tube was reinserted on 04/01/2024. Abdominal x-ray 04/02/2024 showed persistent small bowel dilatation suggesting obstruction.  CT abdomen pelvis ordered for further evaluation. Started on TPN for nutrition on 03/27/2024.    4/17 -- NG tube clamp trial, Q4H check residuals  --Diet per surgery, currently NPO --Off IV fluids --Continue TPN --Continue to progress ambulation --Follow-up with general surgery   Bradycardia, recurrent Initially noted secondary to neostigmine as administration.  Heart rate was down to 20s.  Patient received atropine.  Heart rate improved.  On 4/3 noted to be bradycardic, asymptomatic with heart rate in the 40s to  50s.  Beta-blockade was held. Use IV metoprolol as needed    Severe sepsis with acute organ dysfunction (HCC) 2/2 PNA Elevated lactic acid of 2.6, increased respiration rate, with fever of 102 F at home, organ involvement are cardiac and pulmonary with hypoxic respiratory failure at 88% on room air, elevated high sensitive troponin of 98 and elevated BNP --Imaging concerning for right lower lobe pneumonia, although not much upper respiratory symptoms.  Procalcitonin at 0.352. Respiratory panels were negative. UA was not consistent with UTI Patient received broad-spectrum antibiotics Completed antibiotic course    Acute hypoxic respiratory failure (HCC) Resolved.  He is tolerating room air    Elevated troponin 2/2 demand ischemia First high sensitivity troponin was elevated at 98 >>101>175>226,  Suspect secondary to demand ischemia in setting of severe sepsis with acute hypoxic respiratory failure Echocardiogram with EF of 40% and regional wall motion abnormalities, discussed with cardiology and they think these abnormalities are chronic. Patient was also experiencing intermittent chest pain over the past couple of months which seems more exertional --cardio consulted, no plan for cardiac workup    CAD (coronary artery disease) With history of anterior STEMI in 2006, transferred to [facility] status post cardiac catheterization with PCI to mid LAD using Cypher stent; currently no longer on Plavix Continue aspirin and statin    COPD (chronic obstructive pulmonary disease) (HCC) Bronchodilators as needed   Elevated serum creatinine Creatinine is stable.  Does not meet acute kidney injury criteria, suspect prerenal in setting of nausea vomiting Serum creatinine baseline is 1.35-1.36/eGFR 52-55, consistent with CKD stage III A Monitor BMP.  Minimize use of nephrotoxins     Thyroid nodule greater than or equal to 1.5 cm in diameter incidentally noted on imaging study Incidental finding  of a 2.7 cm thyroid nodule. -Thyroid ultrasound with solitary nodule.  Outpatient biopsy recommended --TSH wnl    Rash Developed morning of 3/28, all over chest, back and bilateral thighs.  Appeared to be allergic rxn.  Rash improved with prednisone.  Completed 5 days of prednisone 40 mg daily.   Type II DM, hyperglycemia:  NovoLog as needed for hyperglycemia.  Hemoglobin A1c 6.5    Comorbidities include hypertension, hyperlipidemia, osteoarthritis of both knees s/p bilateral knee arthroplasty with mild right knee effusion, gout    Diet Order             Diet NPO time specified Except for: Ice Chips  Diet effective now                    Consultants: General surgery Gastroenterology Cardiology  Procedures: Flexible sigmoidoscopy on 03/17/2024 Robotic assisted laparoscopic loop ileostomy with decompression of cecum on 03/20/2024    Medications:    allopurinol  300 mg Per NG tube Daily   amLODipine  5 mg Per NG tube Daily   aspirin  81 mg Per NG tube Daily   Chlorhexidine Gluconate Cloth  6 each Topical Daily   diclofenac Sodium  2 g Topical TID   enoxaparin (LOVENOX) injection  40 mg Subcutaneous Q24H   guaiFENesin  600 mg Oral BID   insulin aspart  0-9 Units Subcutaneous Q6H   mometasone-formoterol  2 puff Inhalation BID   multivitamin with minerals  1 tablet Per Tube Daily   pantoprazole (PROTONIX) IV  40 mg Intravenous Daily   sodium chloride flush  10-40 mL Intracatheter Q12H  Continuous Infusions:  promethazine (PHENERGAN) injection (IM or IVPB) 12.5 mg (04/04/24 0008)   TPN ADULT (ION) 80 mL/hr at 04/03/24 1818   TPN ADULT (ION)       Anti-infectives (From admission, onward)    Start     Dose/Rate Route Frequency Ordered Stop   03/20/24 1315  cefoTEtan (CEFOTAN) 2 g in sodium chloride 0.9 % 100 mL IVPB  Status:  Discontinued        2 g 200 mL/hr over 30 Minutes Intravenous  Once 03/20/24 1228 03/24/24 0924   03/20/24 0830  cefoTEtan (CEFOTAN) 2  g in sodium chloride 0.9 % 100 mL IVPB        2 g 200 mL/hr over 30 Minutes Intravenous  Once 03/19/24 1739 03/20/24 0825   03/13/24 1200  levofloxacin (LEVAQUIN) tablet 750 mg        750 mg Oral Daily 03/13/24 1148 03/15/24 1413   03/10/24 1600  vancomycin (VANCOREADY) IVPB 1250 mg/250 mL  Status:  Discontinued        1,250 mg 166.7 mL/hr over 90 Minutes Intravenous Every 24 hours 03/10/24 1056 03/10/24 1250   03/10/24 0945  azithromycin (ZITHROMAX) 500 mg in sodium chloride 0.9 % 250 mL IVPB        500 mg 250 mL/hr over 60 Minutes Intravenous Every 24 hours 03/10/24 0934 03/12/24 1024   03/10/24 0200  metroNIDAZOLE (FLAGYL) IVPB 500 mg  Status:  Discontinued        500 mg 100 mL/hr over 60 Minutes Intravenous Every 12 hours 03/09/24 1515 03/10/24 0933   03/10/24 0000  ceFEPIme (MAXIPIME) 2 g in sodium chloride 0.9 % 100 mL IVPB  Status:  Discontinued        2 g 200 mL/hr over 30 Minutes Intravenous Every 12 hours 03/09/24 1541 03/13/24 1148   03/09/24 1600  vancomycin (VANCOCIN) IVPB 1000 mg/200 mL premix        1,000 mg 200 mL/hr over 60 Minutes Intravenous  Once 03/09/24 1537 03/09/24 1834   03/09/24 1537  vancomycin variable dose per unstable renal function (pharmacist dosing)  Status:  Discontinued         Does not apply See admin instructions 03/09/24 1537 03/10/24 1056   03/09/24 1515  metroNIDAZOLE (FLAGYL) IVPB 500 mg  Status:  Discontinued        500 mg 100 mL/hr over 60 Minutes Intravenous Every 12 hours 03/09/24 1504 03/09/24 1515   03/09/24 1200  ceFEPIme (MAXIPIME) 2 g in sodium chloride 0.9 % 100 mL IVPB        2 g 200 mL/hr over 30 Minutes Intravenous  Once 03/09/24 1152 03/09/24 1304   03/09/24 1200  metroNIDAZOLE (FLAGYL) IVPB 500 mg        500 mg 100 mL/hr over 60 Minutes Intravenous  Once 03/09/24 1152 03/09/24 1505   03/09/24 1200  vancomycin (VANCOCIN) IVPB 1000 mg/200 mL premix        1,000 mg 200 mL/hr over 60 Minutes Intravenous  Once 03/09/24 1152 03/09/24  1523          Family Communication/Anticipated D/C date and plan/Code Status   DVT prophylaxis: enoxaparin (LOVENOX) injection 40 mg Start: 03/26/24 2200 Place TED hose Start: 03/09/24 1502     Code Status: Full Code  Family Communication: Plan discussed with Lamond Pilot, wife, at the bedside bedside.  All her questions were answered. Disposition Plan: Plan to discharge to SNF   Status is: Inpatient Remains inpatient appropriate because: Persistent ileus NPO with  NG tube in place   Subjective:   Pt awake resting in bed this AM. NG tube clamped a few hours ago. Pt denies N/V or increasing pain as long as he stays still.  When he moves he quickly feels nauseated.  No other acute complaints, but hopes to eat solid foods again soon.    Objective:    Vitals:   04/03/24 0247 04/03/24 0432 04/03/24 0812 04/03/24 1519  BP: 117/65  (!) 109/59 129/60  Pulse: 78  (!) 53 69  Resp: 18  16 16   Temp: 98.2 F (36.8 C) (!) 97.5 F (36.4 C) 98.2 F (36.8 C) 97.8 F (36.6 C)  TempSrc:      SpO2: 96%  96% 95%  Weight:      Height:       No data found.   Intake/Output Summary (Last 24 hours) at 04/04/2024 1440 Last data filed at 04/04/2024 0752 Gross per 24 hour  Intake 80 ml  Output 2300 ml  Net -2220 ml   Filed Weights   03/30/24 0349 03/31/24 0441 04/01/24 0500  Weight: 79.7 kg 81.6 kg 81.4 kg    Exam:    General exam: awake, alert, no acute distress HEENT: moist mucus membranes, hearing grossly normal, NG tube in place clamped Respiratory system: on room air normal respiratory effort. Cardiovascular system: RRR, no pedal edema.   Gastrointestinal system: ostomy bag present, surgical incisions appear healthy, soft and non-distended Central nervous system: A&O x 3. no gross focal neurologic deficits, normal speech Extremities: moves all, no edema, normal tone Psychiatry: normal mood, congruent affect, judgement and insight appear normal     Data Reviewed:   I have  personally reviewed following labs and imaging studies:  Labs: Labs show the following:   Basic Metabolic Panel: Recent Labs  Lab 03/29/24 0735 03/30/24 1232 04/01/24 0747 04/02/24 0503 04/04/24 0930  NA 142 142 137 137 134*  K 4.4 4.9 4.8 4.6 4.5  CL 111 113* 108 109 105  CO2 23 20* 22 23 23   GLUCOSE 156* 145* 147* 120* 124*  BUN 34* 38* 30* 31* 26*  CREATININE 0.95 1.00 0.88 0.89 0.90  CALCIUM 9.1 9.3 9.1 8.7* 8.6*  MG 2.3  --  2.1  --  2.0  PHOS 3.2  --  3.3  --  3.5   GFR Estimated Creatinine Clearance: 62.2 mL/min (by C-G formula based on SCr of 0.9 mg/dL). Liver Function Tests: Recent Labs  Lab 03/29/24 0735 03/30/24 1232 04/01/24 0747 04/04/24 0930  AST 14* 15 22 15   ALT 16 17 28 21   ALKPHOS 49 58 74 63  BILITOT 0.7 0.8 0.5 0.4  PROT 6.8 7.1 6.8 6.0*  ALBUMIN 3.3* 3.3* 2.9* 2.6*   No results for input(s): "LIPASE", "AMYLASE" in the last 168 hours. No results for input(s): "AMMONIA" in the last 168 hours. Coagulation profile No results for input(s): "INR", "PROTIME" in the last 168 hours.  CBC: Recent Labs  Lab 03/29/24 0735 04/02/24 0503  WBC 6.4 10.3  HGB 13.2 11.9*  HCT 41.1 37.3*  MCV 101.5* 102.2*  PLT 224 202   Cardiac Enzymes: No results for input(s): "CKTOTAL", "CKMB", "CKMBINDEX", "TROPONINI" in the last 168 hours. BNP (last 3 results) No results for input(s): "PROBNP" in the last 8760 hours. CBG: Recent Labs  Lab 04/03/24 1746 04/03/24 2148 04/03/24 2359 04/04/24 0612 04/04/24 1350  GLUCAP 144* 132* 122* 126* 136*   D-Dimer: No results for input(s): "DDIMER" in the last 72 hours. Hgb  A1c: No results for input(s): "HGBA1C" in the last 72 hours.  Lipid Profile: No results for input(s): "CHOL", "HDL", "LDLCALC", "TRIG", "CHOLHDL", "LDLDIRECT" in the last 72 hours.   Thyroid function studies: No results for input(s): "TSH", "T4TOTAL", "T3FREE", "THYROIDAB" in the last 72 hours.  Invalid input(s): "FREET3" Anemia work  up: No results for input(s): "VITAMINB12", "FOLATE", "FERRITIN", "TIBC", "IRON", "RETICCTPCT" in the last 72 hours. Sepsis Labs: Recent Labs  Lab 03/29/24 0735 04/02/24 0503  WBC 6.4 10.3    Microbiology No results found for this or any previous visit (from the past 240 hours).  Procedures and diagnostic studies:  DG ABD ACUTE 2+V W 1V CHEST Result Date: 04/03/2024 CLINICAL DATA:  Ileus after gastrointestinal surgery EXAM: DG ABDOMEN ACUTE WITH 1 VIEW CHEST COMPARISON:  Yesterday FINDINGS: Low volume chest with elevated right diaphragm and overlying atelectasis or scar. Contrast is seen within the bladder and within bowel loops. Bowel distension has improved. An enteric tube tip and side-port reaches the collapsed stomach. Right PICC with tip at the SVC. IMPRESSION: Improving bowel distension, although still abnormal with dilute contrast seen from CT yesterday. Electronically Signed   By: Ronnette Coke M.D.   On: 04/03/2024 11:10                LOS: 26 days   Montey Apa  [facility]   Pager on [email or web address]. If 7PM-7AM, please contact night-coverage at [email or web address]     04/04/2024, 2:40 PM

## 2024-04-04 NOTE — Plan of Care (Signed)
  Problem: Education: Goal: Knowledge of General Education information will improve Description: Including pain rating scale, medication(s)/side effects and non-pharmacologic comfort measures Outcome: Progressing   Problem: Clinical Measurements: Goal: Respiratory complications will improve Outcome: Progressing   Problem: Clinical Measurements: Goal: Cardiovascular complication will be avoided Outcome: Progressing   Problem: Activity: Goal: Risk for activity intolerance will decrease Outcome: Progressing   Problem: Nutrition: Goal: Adequate nutrition will be maintained Outcome: Progressing   Problem: Coping: Goal: Level of anxiety will decrease Outcome: Progressing   Problem: Elimination: Goal: Will not experience complications related to bowel motility Outcome: Progressing Goal: Will not experience complications related to urinary retention Outcome: Progressing   Problem: Safety: Goal: Ability to remain free from injury will improve Outcome: Progressing   Problem: Skin Integrity: Goal: Risk for impaired skin integrity will decrease Outcome: Progressing   Problem: Pain Managment: Goal: General experience of comfort will improve and/or be controlled Outcome: Progressing

## 2024-04-04 NOTE — Progress Notes (Signed)
 OT Cancellation Note  Patient Details Name: Todd Rojas MRN: [id number] DOB: 09-Jul-1941   Cancelled Treatment:    Reason Eval/Treat Not Completed: Medical issues which prohibited therapy RN endorses pt is participating in assessment with GI which requires him to remain still and relaxed for duration of assessment. Will re attempt as able .  Rosaria Common M.S. OTR/L  04/04/24, 11:35 AM

## 2024-04-04 NOTE — Progress Notes (Signed)
 [facility] SURGICAL PROGRESS NOTE  Hospital Day(s): 26.   Post op day(s): 15 Days Post-Op.   Interval History:  Patient seen and examined Patient reports he is feeling better No fever, chills, nausea, emesis  Labs remain in process UO - 1150 ccs NGT output 600 ccs - slowing this morning but still somewhat bilious  Ileostomy with 550 ccs out recorded   Vital signs in last 24 hours: [min-max] current  Temp:  [97.8 F (36.6 C)-98.2 F (36.8 C)] 97.8 F (36.6 C) (04/16 1519) Pulse Rate:  [53-69] 69 (04/16 1519) Resp:  [16] 16 (04/16 1519) BP: (109-129)/(59-60) 129/60 (04/16 1519) SpO2:  [95 %-96 %] 95 % (04/16 1519)     Height: 5\' 9"  (175.3 cm) Weight: 81.4 kg BMI (Calculated): 26.48   Intake/Output last 2 shifts:  04/16 0701 - 04/17 0700 In: 80 [P.O.:30; NG/GT:50] Out: 2300 [Urine:1150; Emesis/NG output:600; Stool:550]   Physical Exam:  Constitutional: alert, awake, NAD HEENT: NGT in place; <50 ccs in canister  Respiratory: breathing non-labored at rest  Cardiovascular: regular rate and sinus rhythm  Gastrointestinal: soft, he does not appear overtly tender, continues to be distended/tympanic, no rebound/guarding. Ileostomy in RLQ, stool and gas in bag this AM Integumentary: Laparoscopic incisions are CDI with dermabond   Labs:     Latest Ref Rng & Units 04/02/2024    5:03 AM 03/29/2024    7:35 AM 03/27/2024    6:48 AM  CBC  WBC 4.0 - 10.5 K/uL 10.3  6.4  10.3   Hemoglobin 13.0 - 17.0 g/dL 40.9  81.1  91.4   Hematocrit 39.0 - 52.0 % 37.3  41.1  40.7   Platelets 150 - 400 K/uL 202  224  243       Latest Ref Rng & Units 04/02/2024    5:03 AM 04/01/2024    7:47 AM 03/30/2024   12:32 PM  CMP  Glucose 70 - 99 mg/dL 782  956  213   BUN 8 - 23 mg/dL 31  30  38   Creatinine 0.61 - 1.24 mg/dL 0.86  5.78  4.69   Sodium 135 - 145 mmol/L 137  137  142   Potassium 3.5 - 5.1 mmol/L 4.6  4.8  4.9   Chloride 98 - 111 mmol/L 109  108  113   CO2 22 - 32 mmol/L 23   22  20    Calcium 8.9 - 10.3 mg/dL 8.7  9.1  9.3   Total Protein 6.5 - 8.1 g/dL  6.8  7.1   Total Bilirubin 0.0 - 1.2 mg/dL  0.5  0.8   Alkaline Phos 38 - 126 U/L  74  58   AST 15 - 41 U/L  22  15   ALT 0 - 44 U/L  28  17      Imaging studies: No new imaging studies    Assessment/Plan:  83 y.o. 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    - Clinically making slow improvements. I will attempt clamping trial of NGT this morning optimistically. Will clamp x4 hours and check residuals. If residuals are <150 cc, we can consider allowing sips of liquids/ice chips around the tube. Will not start formal diet yet and he understands we may do CLD around the tube at first as well.   - If residuals are >  200 ccs, we may return the tube to LIS for another 24 hours. If at anytime while clamped he has worsening pain, nausea, or emesis, we will return the tube to suction as well.     - Continue TPN; monitor electrolytes  - No need for emergent surgical intervention   - Monitor abdominal examination; on-going ileostomy function   - Pain control prn; minimize narcotics as feasible - Antiemetics prn   - Encouraged frequent mobilization as feasible - therapies following, walked 300 feet per notes  - Further management per primary service; we will follow   - Discharge Planning: Not ready for discharge, still with ileus   All of the above findings and recommendations were discussed with the patient, patient's family (wife at bedside), and the medical team, and all of their questions were answered to their expressed satisfaction.  -- Apolonio Bay, PA-C [facility] 04/04/2024, 7:17 AM M-F: 7am - 4pm

## 2024-04-04 NOTE — Progress Notes (Signed)
 Occupational Therapy Treatment Patient Details Name: Todd Rojas MRN: [id number] DOB: 12/23/1940 Today's Date: 04/04/2024   History of present illness Pt is an 83 year old male   MD work up including sepsis, acute hypoxic respiratory failure, PNA    Pmh significant for CKD stage IIIa, chronic gout, hypertension, hyperlipidemia, interstitial pulmonary disease, bronchiectasis, COPD. Pt is s/p exploratory lapileostomy and decompression of cecum on 4/2.   OT comments  Pt seen for OT treatment on this date. Upon arrival to room pt lying in bed with RN in room, pt agreeable to tx. Pt requires supervision for all bed mobility with use of railings. Pt amb within the hallway, to target improved community mobility as well as activity tolerance; ~163ft with use of RW + CGA. Pt retired in Dance movement psychotherapist with feet up, all needs in reach. Pt requested to ask the RN from his ostomy bag, RN notified. His wife entered the room at the end of session. Pt making good progress toward goals, will continue to follow POC. Discharge recommendation remains appropriate.        If plan is discharge home, recommend the following:  A little help with walking and/or transfers;A little help with bathing/dressing/bathroom;Assistance with cooking/housework;Assist for transportation;Help with stairs or ramp for entrance   Equipment Recommendations  BSC/3in1;Tub/shower seat    Recommendations for Other Services      Precautions / Restrictions Precautions Precautions: Fall Recall of Precautions/Restrictions: Intact Restrictions Weight Bearing Restrictions Per Provider Order: No       Mobility Bed Mobility Overal bed mobility: Needs Assistance Bed Mobility: Supine to Sit     Supine to sit: Supervision, Used rails          Transfers Overall transfer level: Needs assistance Equipment used: Rolling walker (2 wheels) Transfers: Sit to/from Stand Sit to Stand: Supervision           General transfer  comment: STS from various surfaces with supervision     Balance Overall balance assessment: Needs assistance Sitting-balance support: Feet supported, Bilateral upper extremity supported Sitting balance-Leahy Scale: Good     Standing balance support: Bilateral upper extremity supported, During functional activity, Reliant on assistive device for balance Standing balance-Leahy Scale: Fair Standing balance comment: Able to static stand with no UE support                           ADL either performed or assessed with clinical judgement   ADL Overall ADL's : Needs assistance/impaired Eating/Feeding: NPO                   Lower Body Dressing: Minimal assistance (sitting)   Toilet Transfer: Contact guard assist;Cueing for sequencing;Ambulation;Rolling walker (2 wheels) (Simulated t/f)           Functional mobility during ADLs: Contact guard assist;Rolling walker (2 wheels) General ADL Comments: MINA adjust bilateral socks while seated on EOB, simulated toilet transfer with RW + CGA    Communication Communication Communication: Impaired Factors Affecting Communication: Hearing impaired   Cognition Arousal: Alert Behavior During Therapy: WFL for tasks assessed/performed Cognition: No apparent impairments Difficult to assess due to: Hard of hearing/deaf       Attention impairment (select first level of impairment): Sustained attention Executive functioning impairment (select all impairments): Problem solving, Sequencing OT - Cognition Comments: Delayed responses to questions                 Following commands: Intact Following commands impaired:  Follows multi-step commands with increased time      Cueing   Cueing Techniques: Verbal cues, Tactile cues  Exercises Exercises: Other exercises Other Exercises Other Exercises: Edu: benefits of mobilty, encouragement to give himself grace    Shoulder Instructions       General Comments All lines  and leads in tact pre/post session    Pertinent Vitals/ Pain       Pain Assessment Pain Assessment: No/denies pain  Home Living                                          Prior Functioning/Environment              Frequency  Min 2X/week        Progress Toward Goals  OT Goals(current goals can now be found in the care plan section)  Progress towards OT goals: Progressing toward goals  Acute Rehab OT Goals Patient Stated Goal: mobilize OT Goal Formulation: With patient Time For Goal Achievement: 04/04/24 Potential to Achieve Goals: Good ADL Goals Pt Will Perform Grooming: standing;with modified independence Pt Will Perform Lower Body Dressing: with contact guard assist;sit to/from stand;with adaptive equipment Pt Will Transfer to Toilet: ambulating;with supervision Pt Will Perform Toileting - Clothing Manipulation and hygiene: with supervision;sit to/from stand  Plan      Co-evaluation                 AM-PAC OT "6 Clicks" Daily Activity     Outcome Measure   Help from another person eating meals?: None Help from another person taking care of personal grooming?: A Little Help from another person toileting, which includes using toliet, bedpan, or urinal?: A Lot Help from another person bathing (including washing, rinsing, drying)?: A Lot Help from another person to put on and taking off regular upper body clothing?: A Little Help from another person to put on and taking off regular lower body clothing?: A Little 6 Click Score: 17    End of Session Equipment Utilized During Treatment: Rolling walker (2 wheels)  OT Visit Diagnosis: Other abnormalities of gait and mobility (R26.89)   Activity Tolerance Patient tolerated treatment well   Patient Left in chair;with call bell/phone within reach;with chair alarm set   Nurse Communication Mobility status        Time: 1356-1420 OT Time Calculation (min): 24 min  Charges: OT General  Charges $OT Visit: 1 Visit OT Treatments $Therapeutic Activity: 23-37 mins  Rosaria Common M.S. OTR/L  04/04/24, 3:20 PM

## 2024-04-04 NOTE — Progress Notes (Signed)
 PHARMACY - TOTAL PARENTERAL NUTRITION CONSULT NOTE   Indication: Prolonged illeus  Patient Measurements: Height: 5\' 9"  (175.3 cm) Weight: 81.4 kg (179 lb 6.4 oz) IBW/kg (Calculated) : 70.7 TPN AdjBW (KG): 77.3 Body mass index is 26.49 kg/m.  Assessment: 83 y.o. 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 Cl:Ac 1:1 ---Add standard MVI and trace elements to TPN ---Continue Sensitive q6h SSI and adjust as needed  ---Monitor TPN labs on Mon/Thurs  Adalberto Acton, PharmD Clinical Pharmacist 04/04/2024

## 2024-04-05 ENCOUNTER — Inpatient Hospital Stay

## 2024-04-05 DIAGNOSIS — A419 Sepsis, unspecified organism: Secondary | ICD-10-CM | POA: Diagnosis not present

## 2024-04-05 DIAGNOSIS — R6520 Severe sepsis without septic shock: Secondary | ICD-10-CM | POA: Diagnosis not present

## 2024-04-05 LAB — GLUCOSE, CAPILLARY
Glucose-Capillary: 115 mg/dL — ABNORMAL HIGH (ref 70–99)
Glucose-Capillary: 126 mg/dL — ABNORMAL HIGH (ref 70–99)
Glucose-Capillary: 138 mg/dL — ABNORMAL HIGH (ref 70–99)
Glucose-Capillary: 139 mg/dL — ABNORMAL HIGH (ref 70–99)
Glucose-Capillary: 148 mg/dL — ABNORMAL HIGH (ref 70–99)

## 2024-04-05 MED ORDER — MELATONIN 5 MG PO TABS
5.0000 mg | ORAL_TABLET | Freq: Every evening | ORAL | Status: DC | PRN
  Administered 2024-04-05: 5 mg via ORAL
  Filled 2024-04-05: qty 1

## 2024-04-05 MED ORDER — MELATONIN 5 MG PO TABS
5.0000 mg | ORAL_TABLET | Freq: Every day | ORAL | Status: DC
  Administered 2024-04-05: 5 mg via ORAL
  Filled 2024-04-05: qty 1

## 2024-04-05 MED ORDER — ZOLPIDEM TARTRATE 5 MG PO TABS
5.0000 mg | ORAL_TABLET | Freq: Every evening | ORAL | Status: DC | PRN
Start: 2024-04-05 — End: 2024-04-06
  Administered 2024-04-06: 5 mg via ORAL
  Filled 2024-04-05: qty 1

## 2024-04-05 MED ORDER — TRAVASOL 10 % IV SOLN
INTRAVENOUS | Status: AC
Start: 2024-04-05 — End: 2024-04-06
  Filled 2024-04-05: qty 1056

## 2024-04-05 NOTE — Progress Notes (Signed)
 Progress Note    Todd Rojas  ZOX:[id number] DOB: 08/02/1941  DOA: 03/09/2024 PCP: Antonio Baumgarten, MD      Brief Narrative:    Medical records reviewed and are as summarized below:  "Todd Rojas is a 83 y.o. male  with history of CKD stage IIIa, chronic gout, hypertension, hyperlipidemia, interstitial pulmonary disease, bronchiectasis, COPD, who presents emergency department for chief concerns of fever, chills, shortness of breath for 2 weeks.   Vitals in the ED showed temperature of 98.4, respiration rate 25, heart rate 94, blood pressure initially 96/69 and improved to 107/66, SpO2 initially desatted to 88% on room air   Status post exploratory laparotomy with robotic assisted laparoscopic loop ileostomy and decompression of cecum on 4/2.  Tolerated procedure well.  No ischemic gut noted   4/7: Worsening nausea.  Inability to tolerate p.o.  Repeat KUB demonstrating ileus.  General surgery reengaged.  Ordering CT abdomen.   4/8: Conservative management per surgery.  No evidence of obstruction.  CT reassuring.  Diet scaled back to n.p.o. for now.  Clear liquids later today if stable."   Further hospital course and management as outlined below.  I assumed care on 04/03/24. NG tube in place for persistent ileus.  Surgery continues to follow closely.     Assessment/Plan:   Principal Problem:   Severe sepsis with acute organ dysfunction (HCC) Active Problems:   Fever   Acute hypoxic respiratory failure (HCC)   CAD (coronary artery disease)   Elevated troponin   Gaseous abdominal distention   COPD (chronic obstructive pulmonary disease) (HCC)   Elevated serum creatinine   Hypertension   Hyperlipidemia, unspecified   Primary osteoarthritis of both knees   Thyroid  nodule greater than or equal to 1.5 cm in diameter incidentally noted on imaging study   Dyslipidemia   Personal history of gout   Ogilvie syndrome   Protein-calorie malnutrition,  severe   Nutrition Problem: Severe Malnutrition Etiology: acute illness  Signs/Symptoms: mild fat depletion, moderate fat depletion, mild muscle depletion, moderate muscle depletion   Body mass index is 26.11 kg/m.   Gaseous abdominal distention 2/2 Colonic ileus  Ogilvie's syndrome Patient developed abdominal distention and crampy abdominal pain after presentation. Starting 3/25, Imaging with right colonic gaseous distention without any obvious obstruction.  Colonic distention has been worsening. --GenSurg and GI consulted, dx with Ogilvie's syndrome --neostigmine  2 mg push morning of 3/29 with Dr. Auston Left and Dr. Corky Diener present with passing of gas and BM afterwards, however, colonic distention unchanged, so pt underwent endoscopic decompression on 3/30.  Unfortunately colonic gas re-accumulated. --started TPN on 3/31 Robotic assisted laparoscopic loop ileostomy with decompression of cecum on 03/20/2024 --Was clinically improving then had deterioration on 4/7.  He was made n.p.o. and was subsequently transitioned to clear liquid diet. He had abdominal pain and vomiting on 03/27/2024 so NGT was replaced. NG tube successfully removed on 03/31/2024 He had recurrent nausea and vomiting on 04/01/2024.  Abdominal x-ray showed persistent gaseous distention so NG tube was reinserted on 04/01/2024. Abdominal x-ray 04/02/2024 showed persistent small bowel dilatation suggesting obstruction.  CT abdomen pelvis ordered for further evaluation. Started on TPN for nutrition on 03/27/2024.    4/17 -- NG tube clamp trial, Q4H check residuals  --Diet per surgery, currently NPO --Off IV fluids --On clear liquid diet - diet per surgery --Continue TPN --Continue to progress ambulation --Follow-up with general surgery   Bradycardia, recurrent Initially noted secondary to neostigmine  as administration.  Heart rate  was down to 20s.  Patient received atropine .  Heart rate improved.  On 4/3 noted to be bradycardic,  asymptomatic with heart rate in the 40s to 50s.  Beta-blockade was held. Use IV metoprolol  as needed    Severe sepsis with acute organ dysfunction (HCC) 2/2 PNA Elevated lactic acid of 2.6, increased respiration rate, with fever of 102 F at home, organ involvement are cardiac and pulmonary with hypoxic respiratory failure at 88% on room air, elevated high sensitive troponin of 98 and elevated BNP --Imaging concerning for right lower lobe pneumonia, although not much upper respiratory symptoms.  Procalcitonin at 0.352. Respiratory panels were negative. UA was not consistent with UTI Patient received broad-spectrum antibiotics Completed antibiotic course    Acute hypoxic respiratory failure (HCC) Resolved.  He is tolerating room air    Elevated troponin 2/2 demand ischemia First high sensitivity troponin was elevated at 98 >>101>175>226,  Suspect secondary to demand ischemia in setting of severe sepsis with acute hypoxic respiratory failure Echocardiogram with EF of 40% and regional wall motion abnormalities, discussed with cardiology and they think these abnormalities are chronic. Patient was also experiencing intermittent chest pain over the past couple of months which seems more exertional --cardio consulted, no plan for cardiac workup    CAD (coronary artery disease) With history of anterior STEMI in 2006, transferred to [facility] status post cardiac catheterization with PCI to mid LAD using Cypher stent; currently no longer on Plavix Continue aspirin  and statin    COPD (chronic obstructive pulmonary disease) (HCC) Bronchodilators as needed   Elevated serum creatinine Creatinine is stable.  Does not meet acute kidney injury criteria, suspect prerenal in setting of nausea vomiting Serum creatinine baseline is 1.35-1.36/eGFR 52-55, consistent with CKD stage III A Monitor BMP.  Minimize use of nephrotoxins     Thyroid  nodule greater than or equal to 1.5 cm in diameter incidentally  noted on imaging study Incidental finding of a 2.7 cm thyroid  nodule. -Thyroid  ultrasound with solitary nodule.  Outpatient biopsy recommended --TSH wnl    Rash Developed morning of 3/28, all over chest, back and bilateral thighs.  Appeared to be allergic rxn.  Rash improved with prednisone .  Completed 5 days of prednisone  40 mg daily.   Type II DM, hyperglycemia:  NovoLog  as needed for hyperglycemia.  Hemoglobin A1c 6.5    Comorbidities include hypertension, hyperlipidemia, osteoarthritis of both knees s/p bilateral knee arthroplasty with mild right knee effusion, gout    Diet Order             Diet clear liquid Fluid consistency: Thin  Diet effective now                    Consultants: General surgery Gastroenterology Cardiology  Procedures: Flexible sigmoidoscopy on 03/17/2024 Robotic assisted laparoscopic loop ileostomy with decompression of cecum on 03/20/2024    Medications:    allopurinol   300 mg Per NG tube Daily   amLODipine   5 mg Per NG tube Daily   aspirin   81 mg Per NG tube Daily   Chlorhexidine  Gluconate Cloth  6 each Topical Daily   diclofenac  Sodium  2 g Topical TID   enoxaparin  (LOVENOX ) injection  40 mg Subcutaneous Q24H   guaiFENesin   600 mg Oral BID   insulin  aspart  0-9 Units Subcutaneous Q6H   melatonin  5 mg Oral QHS   mometasone -formoterol   2 puff Inhalation BID   multivitamin with minerals  1 tablet Per Tube Daily   pantoprazole  (PROTONIX ) IV  40 mg Intravenous Daily   sodium chloride  flush  10-40 mL Intracatheter Q12H   Continuous Infusions:  promethazine  (PHENERGAN ) injection (IM or IVPB) Stopped (04/04/24 0028)   TPN ADULT (ION) 80 mL/hr at 04/05/24 0648   TPN ADULT (ION)       Anti-infectives (From admission, onward)    Start     Dose/Rate Route Frequency Ordered Stop   03/20/24 1315  cefoTEtan  (CEFOTAN ) 2 g in sodium chloride  0.9 % 100 mL IVPB  Status:  Discontinued        2 g 200 mL/hr over 30 Minutes Intravenous  Once  03/20/24 1228 03/24/24 0924   03/20/24 0830  cefoTEtan  (CEFOTAN ) 2 g in sodium chloride  0.9 % 100 mL IVPB        2 g 200 mL/hr over 30 Minutes Intravenous  Once 03/19/24 1739 03/20/24 0825   03/13/24 1200  levofloxacin  (LEVAQUIN ) tablet 750 mg        750 mg Oral Daily 03/13/24 1148 03/15/24 1413   03/10/24 1600  vancomycin  (VANCOREADY) IVPB 1250 mg/250 mL  Status:  Discontinued        1,250 mg 166.7 mL/hr over 90 Minutes Intravenous Every 24 hours 03/10/24 1056 03/10/24 1250   03/10/24 0945  azithromycin  (ZITHROMAX ) 500 mg in sodium chloride  0.9 % 250 mL IVPB        500 mg 250 mL/hr over 60 Minutes Intravenous Every 24 hours 03/10/24 0934 03/12/24 1024   03/10/24 0200  metroNIDAZOLE  (FLAGYL ) IVPB 500 mg  Status:  Discontinued        500 mg 100 mL/hr over 60 Minutes Intravenous Every 12 hours 03/09/24 1515 03/10/24 0933   03/10/24 0000  ceFEPIme  (MAXIPIME ) 2 g in sodium chloride  0.9 % 100 mL IVPB  Status:  Discontinued        2 g 200 mL/hr over 30 Minutes Intravenous Every 12 hours 03/09/24 1541 03/13/24 1148   03/09/24 1600  vancomycin  (VANCOCIN ) IVPB 1000 mg/200 mL premix        1,000 mg 200 mL/hr over 60 Minutes Intravenous  Once 03/09/24 1537 03/09/24 1834   03/09/24 1537  vancomycin  variable dose per unstable renal function (pharmacist dosing)  Status:  Discontinued         Does not apply See admin instructions 03/09/24 1537 03/10/24 1056   03/09/24 1515  metroNIDAZOLE  (FLAGYL ) IVPB 500 mg  Status:  Discontinued        500 mg 100 mL/hr over 60 Minutes Intravenous Every 12 hours 03/09/24 1504 03/09/24 1515   03/09/24 1200  ceFEPIme  (MAXIPIME ) 2 g in sodium chloride  0.9 % 100 mL IVPB        2 g 200 mL/hr over 30 Minutes Intravenous  Once 03/09/24 1152 03/09/24 1304   03/09/24 1200  metroNIDAZOLE  (FLAGYL ) IVPB 500 mg        500 mg 100 mL/hr over 60 Minutes Intravenous  Once 03/09/24 1152 03/09/24 1505   03/09/24 1200  vancomycin  (VANCOCIN ) IVPB 1000 mg/200 mL premix        1,000  mg 200 mL/hr over 60 Minutes Intravenous  Once 03/09/24 1152 03/09/24 1523          Family Communication/Anticipated D/C date and plan/Code Status   DVT prophylaxis: enoxaparin  (LOVENOX ) injection 40 mg Start: 03/26/24 2200 Place TED hose Start: 03/09/24 1502     Code Status: Full Code  Family Communication: Plan discussed with Todd Rojas, wife, at the bedside bedside.  All her questions were answered. Disposition Plan: Plan to discharge  to SNF   Status is: Inpatient Remains inpatient appropriate because: Persistent ileus NPO with NG tube in place   Subjective:   Pt seen with wife and surgeon at bedside this AM.  He ambulated well with PT around the unit two laps yesterday.  Doing well with NG clamped and tolerating clears.     Objective:    Vitals:   04/04/24 2008 04/05/24 0414 04/05/24 0500 04/05/24 0821  BP: 126/66 117/61  110/66  Pulse: 73 75  70  Resp: 14 16  15   Temp: 98.4 F (36.9 C) 97.7 F (36.5 C)  98 F (36.7 C)  TempSrc: Oral Oral  Oral  SpO2: 95% 94%  95%  Weight:   80.2 kg   Height:       No data found.   Intake/Output Summary (Last 24 hours) at 04/05/2024 1452 Last data filed at 04/05/2024 0953 Gross per 24 hour  Intake 2806.59 ml  Output 950 ml  Net 1856.59 ml   Filed Weights   03/31/24 0441 04/01/24 0500 04/05/24 0500  Weight: 81.6 kg 81.4 kg 80.2 kg    Exam:    General exam: awake, alert, no acute distress HEENT: moist mucus membranes, hearing grossly normal, NG tube in place clamped Respiratory system: on room air normal respiratory effort. Cardiovascular system: RRR, no pedal edema.   Gastrointestinal system: ostomy bag present, surgical incisions appear healthy, soft and non-distended Central nervous system: A&O x 3. no gross focal neurologic deficits, normal speech Extremities: moves all, no edema, normal tone Psychiatry: normal mood, congruent affect, judgement and insight appear normal     Data Reviewed:   I have  personally reviewed following labs and imaging studies:  Labs: Labs show the following:   Basic Metabolic Panel: Recent Labs  Lab 03/30/24 1232 04/01/24 0747 04/02/24 0503 04/04/24 0930  NA 142 137 137 134*  K 4.9 4.8 4.6 4.5  CL 113* 108 109 105  CO2 20* 22 23 23   GLUCOSE 145* 147* 120* 124*  BUN 38* 30* 31* 26*  CREATININE 1.00 0.88 0.89 0.90  CALCIUM  9.3 9.1 8.7* 8.6*  MG  --  2.1  --  2.0  PHOS  --  3.3  --  3.5   GFR Estimated Creatinine Clearance: 62.2 mL/min (by C-G formula based on SCr of 0.9 mg/dL). Liver Function Tests: Recent Labs  Lab 03/30/24 1232 04/01/24 0747 04/04/24 0930  AST 15 22 15   ALT 17 28 21   ALKPHOS 58 74 63  BILITOT 0.8 0.5 0.4  PROT 7.1 6.8 6.0*  ALBUMIN  3.3* 2.9* 2.6*   No results for input(s): "LIPASE", "AMYLASE" in the last 168 hours. No results for input(s): "AMMONIA" in the last 168 hours. Coagulation profile No results for input(s): "INR", "PROTIME" in the last 168 hours.  CBC: Recent Labs  Lab 04/02/24 0503  WBC 10.3  HGB 11.9*  HCT 37.3*  MCV 102.2*  PLT 202   Cardiac Enzymes: No results for input(s): "CKTOTAL", "CKMB", "CKMBINDEX", "TROPONINI" in the last 168 hours. BNP (last 3 results) No results for input(s): "PROBNP" in the last 8760 hours. CBG: Recent Labs  Lab 04/04/24 1924 04/04/24 2328 04/05/24 0018 04/05/24 0517 04/05/24 1114  GLUCAP 129* 127* 126* 138* 139*   D-Dimer: No results for input(s): "DDIMER" in the last 72 hours. Hgb A1c: No results for input(s): "HGBA1C" in the last 72 hours.  Lipid Profile: No results for input(s): "CHOL", "HDL", "LDLCALC", "TRIG", "CHOLHDL", "LDLDIRECT" in the last 72 hours.   Thyroid   function studies: No results for input(s): "TSH", "T4TOTAL", "T3FREE", "THYROIDAB" in the last 72 hours.  Invalid input(s): "FREET3" Anemia work up: No results for input(s): "VITAMINB12", "FOLATE", "FERRITIN", "TIBC", "IRON", "RETICCTPCT" in the last 72 hours. Sepsis Labs: Recent  Labs  Lab 04/02/24 0503  WBC 10.3    Microbiology No results found for this or any previous visit (from the past 240 hours).  Procedures and diagnostic studies:  DG ABD ACUTE 2+V W 1V CHEST Result Date: 04/05/2024 CLINICAL DATA:  Ileus EXAM: DG ABDOMEN ACUTE WITH 1 VIEW CHEST COMPARISON:  Chest and abdomen radiograph dated 04/03/2024 FINDINGS: Lines/tubes: Gastric/enteric tube tip projects over the stomach with side hole projecting over the gastric cardia. Right upper extremity PICC tip terminates at the superior cavoatrial junction. Chest: Unchanged elevation of the right hemidiaphragm. Linear bibasilar opacities, likely atelectasis. No pneumothorax or pleural effusion. Normal heart size. Abdomen: A few dilated loops of small bowel are present within the left hemiabdomen. Decreased conspicuity of the previously noted dilated contrast material. No pneumatosis or free air. No abnormal calcification or mass effect. Bones: No acute osseous abnormality. IMPRESSION: 1. A few dilated loops of small bowel are present within the left hemiabdomen, likely ileus. Partial small bowel obstruction could have a similar appearance. Decreased conspicuity of the previously noted dilated contrast material suggesting interval passage. 2. Gastric/enteric tube tip projects over the stomach with side hole projecting over the gastric cardia. Consider advancing by 5 cm for more optimal positioning. Electronically Signed   By: Limin  Xu M.D.   On: 04/05/2024 10:44                LOS: 27 days   Montey Apa  [facility]   Pager on [email or web address]. If 7PM-7AM, please contact night-coverage at [email or web address]     04/05/2024, 2:52 PM

## 2024-04-05 NOTE — Progress Notes (Signed)
 POD # 16 s/p loop ileostomy for Ogilvie's Recurrent ileus NG clamped since yesterday, no nausea Ileostomy w good output Ambulated AVSS KUB w improved ileus    PE NAD Abd: soft, no tenderness, incisions healing well w/o infection, ileostomy pink and patent w good output  A/P Doing well resolving ileus Keep ngt clamped to day and will try clears If he tolerates clears may DC NG tomorrow Continue TPN

## 2024-04-05 NOTE — Progress Notes (Signed)
 PHARMACY - TOTAL PARENTERAL NUTRITION CONSULT NOTE   Indication: Prolonged illeus  Patient Measurements: Height: 5' 9 (175.3 cm) Weight: 80.2 kg (176 lb 12.9 oz) IBW/kg (Calculated) : 70.7 TPN AdjBW (KG): 77.3 Body mass index is 26.11 kg/m.  Assessment: 83 y.o. 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   Glucose / Insulin :  BG 126 - 138 4u SSI last 24h A1C 6.5 Electrolytes: WNL Renal: SCr ~ 1, stable Hepatic: LFTs within normal limits TG: 121 Intake / Output;   Intake/Output Summary (Last 24 hours) at 04/05/2024 0708 Last data filed at 04/05/2024 0559 Gross per 24 hour  Intake 2806.59 ml  Output 1400 ml  Net 1406.59 ml    GI Imaging:Serial KUB prn 4/7: abd CT: postoperative ileus  4/10: abd XR: Persistent small-bowel dilatation  GI Surgeries / Procedures:  4/2-s/p surgery-robotic assisted laparoscopic loop ileostomy with decompression of cecum for Ogilvie Syndrome,   Central access: 03/18/24 TPN start date: 03/18/24>>4/5, resumed 4/9  Nutritional Goals: Goal TPN rate is 80 mL/hr (provides 105.6 g of protein and 2042.9 kcals per day)  RD Assessment: Estimated Needs Total Energy Estimated Needs: 1900-2200kcal/day Total Protein Estimated Needs: 95-110g/day Total Fluid Estimated Needs: 1.9-2.2L/day  Current Nutrition: NPO  Plan:  ---Continue custom TPN at 80 mL/hr  ---Electrolytes in TPN: Na 51mEq/L, K 60mEq/L, Ca 83mEq/L, Mg 29mEq/L, and Phos 15mmol/L. Cl:Ac 1:1 ---Add standard MVI and trace elements to TPN ---Continue Sensitive q6h SSI and adjust as needed  ---Monitor TPN labs on Mon/Thurs  Adriana JONETTA Bolster, PharmD Clinical Pharmacist 04/05/2024

## 2024-04-05 NOTE — Progress Notes (Signed)
 Pt disconnected from TPN. PICC line flushed. Pt going down to XRAY via bed.  0647: Pt back from Xray. TPN restarted. Call bell within reach.

## 2024-04-05 NOTE — TOC Progression Note (Signed)
 Transition of Care [facility]) - Progression Note    Patient Details  Name: Todd Rojas MRN: [id number] Date of Birth: September 14, 1941  [facility] CM/SW Contact  Loman Risk, RN Phone Number: 04/05/2024, 3:09 PM  Clinical Narrative:     NG clamp trail and trail of clears Plan remains for [facility] for STR at discharge and will require auth   Expected Discharge Plan: Skilled Nursing Facility Barriers to Discharge: Continued Medical Work up  Expected Discharge Plan and Services     Post Acute Care Choice:  (TBD) Living arrangements for the past 2 months: Single Family Home                                       Social Determinants of Health (SDOH) Interventions SDOH Screenings   Food Insecurity: No Food Insecurity (03/10/2024)  Housing: Low Risk  (03/10/2024)  Transportation Needs: No Transportation Needs (03/10/2024)  Utilities: Not At Risk (03/10/2024)  Depression (PHQ2-9): Low Risk  (07/12/2021)  Financial Resource Strain: Low Risk  (10/23/2023)   Received from [facility]  Social Connections: Socially Integrated (03/10/2024)  Tobacco Use: Medium Risk (03/25/2024)    Readmission Risk Interventions     No data to display

## 2024-04-06 DIAGNOSIS — R6520 Severe sepsis without septic shock: Secondary | ICD-10-CM | POA: Diagnosis not present

## 2024-04-06 DIAGNOSIS — A419 Sepsis, unspecified organism: Secondary | ICD-10-CM | POA: Diagnosis not present

## 2024-04-06 LAB — GLUCOSE, CAPILLARY
Glucose-Capillary: 130 mg/dL — ABNORMAL HIGH (ref 70–99)
Glucose-Capillary: 143 mg/dL — ABNORMAL HIGH (ref 70–99)
Glucose-Capillary: 151 mg/dL — ABNORMAL HIGH (ref 70–99)
Glucose-Capillary: 137 mg/dL — ABNORMAL HIGH (ref 70–99)

## 2024-04-06 MED ORDER — TRAVASOL 10 % IV SOLN
INTRAVENOUS | Status: AC
  Filled 2024-04-06: qty 1056

## 2024-04-06 MED ORDER — ALLOPURINOL 100 MG PO TABS
300.0000 mg | ORAL_TABLET | Freq: Every day | ORAL | Status: DC
  Administered 2024-04-06 – 2024-04-09 (×4): 300 mg via ORAL
  Filled 2024-04-06 (×3): qty 3

## 2024-04-06 MED ORDER — ASPIRIN 81 MG PO CHEW
81.0000 mg | CHEWABLE_TABLET | Freq: Every day | ORAL | Status: DC
  Administered 2024-04-06 – 2024-04-09 (×4): 81 mg via ORAL
  Filled 2024-04-06 (×3): qty 1

## 2024-04-06 MED ORDER — AMLODIPINE BESYLATE 5 MG PO TABS
5.0000 mg | ORAL_TABLET | Freq: Every day | ORAL | Status: DC
  Administered 2024-04-06 – 2024-04-09 (×4): 5 mg via ORAL
  Filled 2024-04-06 (×3): qty 1

## 2024-04-06 MED ORDER — MELATONIN 5 MG PO TABS
10.0000 mg | ORAL_TABLET | Freq: Every day | ORAL | Status: DC
  Administered 2024-04-06 – 2024-04-08 (×3): 10 mg via ORAL
  Filled 2024-04-06 (×3): qty 2

## 2024-04-06 MED ORDER — DIPHENHYDRAMINE HCL 25 MG PO CAPS
25.0000 mg | ORAL_CAPSULE | Freq: Every evening | ORAL | Status: DC | PRN
  Administered 2024-04-07 – 2024-04-09 (×2): 25 mg via ORAL
  Filled 2024-04-06 (×2): qty 1

## 2024-04-06 MED ORDER — ADULT MULTIVITAMIN W/MINERALS CH
1.0000 | ORAL_TABLET | Freq: Every day | ORAL | Status: DC
  Administered 2024-04-06 – 2024-04-09 (×4): 1 via ORAL
  Filled 2024-04-06 (×3): qty 1

## 2024-04-06 NOTE — Plan of Care (Signed)

## 2024-04-06 NOTE — Progress Notes (Signed)
 Progress Note    Todd Rojas  HQI:[id number] DOB: 14-Sep-1941  DOA: 03/09/2024 PCP: Antonio Baumgarten, MD      Brief Narrative:    Medical records reviewed and are as summarized below:  "Adhvik Canady is a 83 y.o. male  with history of CKD stage IIIa, chronic gout, hypertension, hyperlipidemia, interstitial pulmonary disease, bronchiectasis, COPD, who presents emergency department for chief concerns of fever, chills, shortness of breath for 2 weeks.   Vitals in the ED showed temperature of 98.4, respiration rate 25, heart rate 94, blood pressure initially 96/69 and improved to 107/66, SpO2 initially desatted to 88% on room air   Status post exploratory laparotomy with robotic assisted laparoscopic loop ileostomy and decompression of cecum on 4/2.  Tolerated procedure well.  No ischemic gut noted   4/7: Worsening nausea.  Inability to tolerate p.o.  Repeat KUB demonstrating ileus.  General surgery reengaged.  Ordering CT abdomen.   4/8: Conservative management per surgery.  No evidence of obstruction.  CT reassuring.  Diet scaled back to n.p.o. for now.  Clear liquids later today if stable."   Further hospital course and management as outlined below.  I assumed care on 04/03/24. NG tube in place for persistent ileus.  Surgery continues to follow closely.     Assessment/Plan:   Principal Problem:   Severe sepsis with acute organ dysfunction (HCC) Active Problems:   Fever   Acute hypoxic respiratory failure (HCC)   CAD (coronary artery disease)   Elevated troponin   Gaseous abdominal distention   COPD (chronic obstructive pulmonary disease) (HCC)   Elevated serum creatinine   Hypertension   Hyperlipidemia, unspecified   Primary osteoarthritis of both knees   Thyroid  nodule greater than or equal to 1.5 cm in diameter incidentally noted on imaging study   Dyslipidemia   Personal history of gout   Ogilvie syndrome   Protein-calorie malnutrition,  severe   Nutrition Problem: Severe Malnutrition Etiology: acute illness  Signs/Symptoms: mild fat depletion, moderate fat depletion, mild muscle depletion, moderate muscle depletion   Body mass index is 26.53 kg/m.   Gaseous abdominal distention 2/2 Colonic ileus  Ogilvie's syndrome Patient developed abdominal distention and crampy abdominal pain after presentation. Starting 3/25, Imaging with right colonic gaseous distention without any obvious obstruction.  Colonic distention has been worsening. --GenSurg and GI consulted, dx with Ogilvie's syndrome --neostigmine  2 mg push morning of 3/29 with Dr. Auston Left and Dr. Corky Diener present with passing of gas and BM afterwards, however, colonic distention unchanged, so pt underwent endoscopic decompression on 3/30.  Unfortunately colonic gas re-accumulated. --started TPN on 3/31 Robotic assisted laparoscopic loop ileostomy with decompression of cecum on 03/20/2024 --Was clinically improving then had deterioration on 4/7.  He was made n.p.o. and was subsequently transitioned to clear liquid diet. He had abdominal pain and vomiting on 03/27/2024 so NGT was replaced. NG tube successfully removed on 03/31/2024 He had recurrent nausea and vomiting on 04/01/2024.  Abdominal x-ray showed persistent gaseous distention so NG tube was reinserted on 04/01/2024. Abdominal x-ray 04/02/2024 showed persistent small bowel dilatation suggesting obstruction.  CT abdomen pelvis ordered for further evaluation. Started on TPN for nutrition on 03/27/2024.    4/17 -- NG tube clamp trial, Q4H check residuals  --Diet per surgery, currently NPO --Off IV fluids --On clear liquid diet - diet per surgery --Continue TPN --Continue to progress ambulation --Follow-up with general surgery   Bradycardia, recurrent Initially noted secondary to neostigmine  as administration.  Heart rate  was down to 20s.  Patient received atropine .  Heart rate improved.  On 4/3 noted to be bradycardic,  asymptomatic with heart rate in the 40s to 50s.  Beta-blockade was held. Use IV metoprolol  as needed    Severe sepsis with acute organ dysfunction (HCC) 2/2 PNA Elevated lactic acid of 2.6, increased respiration rate, with fever of 102 F at home, organ involvement are cardiac and pulmonary with hypoxic respiratory failure at 88% on room air, elevated high sensitive troponin of 98 and elevated BNP --Imaging concerning for right lower lobe pneumonia, although not much upper respiratory symptoms.  Procalcitonin at 0.352. Respiratory panels were negative. UA was not consistent with UTI Patient received broad-spectrum antibiotics Completed antibiotic course    Acute hypoxic respiratory failure (HCC) Resolved.  He is tolerating room air    Elevated troponin 2/2 demand ischemia First high sensitivity troponin was elevated at 98 >>101>175>226,  Suspect secondary to demand ischemia in setting of severe sepsis with acute hypoxic respiratory failure Echocardiogram with EF of 40% and regional wall motion abnormalities, discussed with cardiology and they think these abnormalities are chronic. Patient was also experiencing intermittent chest pain over the past couple of months which seems more exertional --cardio consulted, no plan for cardiac workup    CAD (coronary artery disease) With history of anterior STEMI in 2006, transferred to [facility] status post cardiac catheterization with PCI to mid LAD using Cypher stent; currently no longer on Plavix Continue aspirin  and statin    COPD (chronic obstructive pulmonary disease) (HCC) Bronchodilators as needed   Elevated serum creatinine Creatinine is stable.  Does not meet acute kidney injury criteria, suspect prerenal in setting of nausea vomiting Serum creatinine baseline is 1.35-1.36/eGFR 52-55, consistent with CKD stage III A Monitor BMP.  Minimize use of nephrotoxins  Insomnia - suspect hospital environment, vs chronic --melatonin increase to  10 mg (no help with 5 mg) --change ambien  to benadryl  PRN     Thyroid  nodule greater than or equal to 1.5 cm in diameter incidentally noted on imaging study Incidental finding of a 2.7 cm thyroid  nodule. -Thyroid  ultrasound with solitary nodule.  Outpatient biopsy recommended --TSH wnl    Rash Developed morning of 3/28, all over chest, back and bilateral thighs.  Appeared to be allergic rxn.  Rash improved with prednisone .  Completed 5 days of prednisone  40 mg daily.   Type II DM, hyperglycemia:  NovoLog  as needed for hyperglycemia.  Hemoglobin A1c 6.5    Comorbidities include hypertension, hyperlipidemia, osteoarthritis of both knees s/p bilateral knee arthroplasty with mild right knee effusion, gout    Diet Order             Diet full liquid Room service appropriate? Yes; Fluid consistency: Thin  Diet effective now                    Consultants: General surgery Gastroenterology Cardiology  Procedures: Flexible sigmoidoscopy on 03/17/2024 Robotic assisted laparoscopic loop ileostomy with decompression of cecum on 03/20/2024    Medications:    allopurinol   300 mg Oral Daily   amLODipine   5 mg Oral Daily   aspirin   81 mg Oral Daily   Chlorhexidine  Gluconate Cloth  6 each Topical Daily   diclofenac  Sodium  2 g Topical TID   enoxaparin  (LOVENOX ) injection  40 mg Subcutaneous Q24H   guaiFENesin   600 mg Oral BID   insulin  aspart  0-9 Units Subcutaneous Q6H   melatonin  10 mg Oral QHS   mometasone -formoterol   2 puff Inhalation BID   multivitamin with minerals  1 tablet Oral Daily   pantoprazole  (PROTONIX ) IV  40 mg Intravenous Daily   sodium chloride  flush  10-40 mL Intracatheter Q12H   Continuous Infusions:  promethazine  (PHENERGAN ) injection (IM or IVPB) Stopped (04/04/24 0028)   TPN ADULT (ION) 80 mL/hr at 04/06/24 0142   TPN ADULT (ION)       Anti-infectives (From admission, onward)    Start     Dose/Rate Route Frequency Ordered Stop   03/20/24  1315  cefoTEtan  (CEFOTAN ) 2 g in sodium chloride  0.9 % 100 mL IVPB  Status:  Discontinued        2 g 200 mL/hr over 30 Minutes Intravenous  Once 03/20/24 1228 03/24/24 0924   03/20/24 0830  cefoTEtan  (CEFOTAN ) 2 g in sodium chloride  0.9 % 100 mL IVPB        2 g 200 mL/hr over 30 Minutes Intravenous  Once 03/19/24 1739 03/20/24 0825   03/13/24 1200  levofloxacin  (LEVAQUIN ) tablet 750 mg        750 mg Oral Daily 03/13/24 1148 03/15/24 1413   03/10/24 1600  vancomycin  (VANCOREADY) IVPB 1250 mg/250 mL  Status:  Discontinued        1,250 mg 166.7 mL/hr over 90 Minutes Intravenous Every 24 hours 03/10/24 1056 03/10/24 1250   03/10/24 0945  azithromycin  (ZITHROMAX ) 500 mg in sodium chloride  0.9 % 250 mL IVPB        500 mg 250 mL/hr over 60 Minutes Intravenous Every 24 hours 03/10/24 0934 03/12/24 1024   03/10/24 0200  metroNIDAZOLE  (FLAGYL ) IVPB 500 mg  Status:  Discontinued        500 mg 100 mL/hr over 60 Minutes Intravenous Every 12 hours 03/09/24 1515 03/10/24 0933   03/10/24 0000  ceFEPIme  (MAXIPIME ) 2 g in sodium chloride  0.9 % 100 mL IVPB  Status:  Discontinued        2 g 200 mL/hr over 30 Minutes Intravenous Every 12 hours 03/09/24 1541 03/13/24 1148   03/09/24 1600  vancomycin  (VANCOCIN ) IVPB 1000 mg/200 mL premix        1,000 mg 200 mL/hr over 60 Minutes Intravenous  Once 03/09/24 1537 03/09/24 1834   03/09/24 1537  vancomycin  variable dose per unstable renal function (pharmacist dosing)  Status:  Discontinued         Does not apply See admin instructions 03/09/24 1537 03/10/24 1056   03/09/24 1515  metroNIDAZOLE  (FLAGYL ) IVPB 500 mg  Status:  Discontinued        500 mg 100 mL/hr over 60 Minutes Intravenous Every 12 hours 03/09/24 1504 03/09/24 1515   03/09/24 1200  ceFEPIme  (MAXIPIME ) 2 g in sodium chloride  0.9 % 100 mL IVPB        2 g 200 mL/hr over 30 Minutes Intravenous  Once 03/09/24 1152 03/09/24 1304   03/09/24 1200  metroNIDAZOLE  (FLAGYL ) IVPB 500 mg        500 mg 100 mL/hr  over 60 Minutes Intravenous  Once 03/09/24 1152 03/09/24 1505   03/09/24 1200  vancomycin  (VANCOCIN ) IVPB 1000 mg/200 mL premix        1,000 mg 200 mL/hr over 60 Minutes Intravenous  Once 03/09/24 1152 03/09/24 1523          Family Communication/Anticipated D/C date and plan/Code Status   DVT prophylaxis: enoxaparin  (LOVENOX ) injection 40 mg Start: 03/26/24 2200 Place TED hose Start: 03/09/24 1502     Code Status: Full Code  Family Communication:  Plan discussed with Lamond Pilot, wife, at the bedside bedside.  All her questions were answered. Disposition Plan: Plan to discharge to SNF   Status is: Inpatient Remains inpatient appropriate because: Persistent ileus NPO with NG tube in place   Subjective:   Pt seen with wife and surgeon at bedside this AM.  He reports poor sleep again despite melatonin and ambien , only slept a couple hours after ambien  was given.  No N/V with tube clamped so far. Toleraing clear liquid diet.   Objective:    Vitals:   04/05/24 2035 04/06/24 0406 04/06/24 0455 04/06/24 0800  BP: 131/71 103/65  131/60  Pulse: 64 72  63  Resp: 20 16  17   Temp: 97.8 F (36.6 C) 97.7 F (36.5 C)  (!) 97.5 F (36.4 C)  TempSrc: Oral Oral  Oral  SpO2: 94% 97%  96%  Weight:   81.5 kg   Height:       No data found.   Intake/Output Summary (Last 24 hours) at 04/06/2024 1154 Last data filed at 04/06/2024 0616 Gross per 24 hour  Intake 841.49 ml  Output 1050 ml  Net -208.51 ml   Filed Weights   04/01/24 0500 04/05/24 0500 04/06/24 0455  Weight: 81.4 kg 80.2 kg 81.5 kg    Exam:    General exam: awake, alert, no acute distress HEENT: moist mucus membranes, hearing grossly normal, NG tube in place clamped Respiratory system: on room air normal respiratory effort. Cardiovascular system: RRR, no pedal edema.   Gastrointestinal system: ostomy bag present, surgical incisions appear healthy, soft and non-distended Central nervous system: A&O x 3. no gross focal  neurologic deficits, normal speech Extremities: moves all, no edema, normal tone Psychiatry: normal mood, congruent affect, judgement and insight appear normal     Data Reviewed:   I have personally reviewed following labs and imaging studies:  Labs: Labs show the following:   Basic Metabolic Panel: Recent Labs  Lab 03/30/24 1232 04/01/24 0747 04/02/24 0503 04/04/24 0930  NA 142 137 137 134*  K 4.9 4.8 4.6 4.5  CL 113* 108 109 105  CO2 20* 22 23 23   GLUCOSE 145* 147* 120* 124*  BUN 38* 30* 31* 26*  CREATININE 1.00 0.88 0.89 0.90  CALCIUM  9.3 9.1 8.7* 8.6*  MG  --  2.1  --  2.0  PHOS  --  3.3  --  3.5   GFR Estimated Creatinine Clearance: 62.2 mL/min (by C-G formula based on SCr of 0.9 mg/dL). Liver Function Tests: Recent Labs  Lab 03/30/24 1232 04/01/24 0747 04/04/24 0930  AST 15 22 15   ALT 17 28 21   ALKPHOS 58 74 63  BILITOT 0.8 0.5 0.4  PROT 7.1 6.8 6.0*  ALBUMIN  3.3* 2.9* 2.6*   No results for input(s): "LIPASE", "AMYLASE" in the last 168 hours. No results for input(s): "AMMONIA" in the last 168 hours. Coagulation profile No results for input(s): "INR", "PROTIME" in the last 168 hours.  CBC: Recent Labs  Lab 04/02/24 0503  WBC 10.3  HGB 11.9*  HCT 37.3*  MCV 102.2*  PLT 202   Cardiac Enzymes: No results for input(s): "CKTOTAL", "CKMB", "CKMBINDEX", "TROPONINI" in the last 168 hours. BNP (last 3 results) No results for input(s): "PROBNP" in the last 8760 hours. CBG: Recent Labs  Lab 04/05/24 1114 04/05/24 1710 04/05/24 2328 04/06/24 0558 04/06/24 1122  GLUCAP 139* 148* 115* 130* 143*   D-Dimer: No results for input(s): "DDIMER" in the last 72 hours. Hgb A1c: No results  for input(s): "HGBA1C" in the last 72 hours.  Lipid Profile: No results for input(s): "CHOL", "HDL", "LDLCALC", "TRIG", "CHOLHDL", "LDLDIRECT" in the last 72 hours.   Thyroid  function studies: No results for input(s): "TSH", "T4TOTAL", "T3FREE", "THYROIDAB" in the  last 72 hours.  Invalid input(s): "FREET3" Anemia work up: No results for input(s): "VITAMINB12", "FOLATE", "FERRITIN", "TIBC", "IRON", "RETICCTPCT" in the last 72 hours. Sepsis Labs: Recent Labs  Lab 04/02/24 0503  WBC 10.3    Microbiology No results found for this or any previous visit (from the past 240 hours).  Procedures and diagnostic studies:  DG ABD ACUTE 2+V W 1V CHEST Result Date: 04/05/2024 CLINICAL DATA:  Ileus EXAM: DG ABDOMEN ACUTE WITH 1 VIEW CHEST COMPARISON:  Chest and abdomen radiograph dated 04/03/2024 FINDINGS: Lines/tubes: Gastric/enteric tube tip projects over the stomach with side hole projecting over the gastric cardia. Right upper extremity PICC tip terminates at the superior cavoatrial junction. Chest: Unchanged elevation of the right hemidiaphragm. Linear bibasilar opacities, likely atelectasis. No pneumothorax or pleural effusion. Normal heart size. Abdomen: A few dilated loops of small bowel are present within the left hemiabdomen. Decreased conspicuity of the previously noted dilated contrast material. No pneumatosis or free air. No abnormal calcification or mass effect. Bones: No acute osseous abnormality. IMPRESSION: 1. A few dilated loops of small bowel are present within the left hemiabdomen, likely ileus. Partial small bowel obstruction could have a similar appearance. Decreased conspicuity of the previously noted dilated contrast material suggesting interval passage. 2. Gastric/enteric tube tip projects over the stomach with side hole projecting over the gastric cardia. Consider advancing by 5 cm for more optimal positioning. Electronically Signed   By: Limin  Xu M.D.   On: 04/05/2024 10:44                LOS: 28 days   Montey Apa  [facility]   Pager on [email or web address]. If 7PM-7AM, please contact night-coverage at [email or web address]     04/06/2024, 11:54 AM

## 2024-04-06 NOTE — Progress Notes (Signed)
 PHARMACY - TOTAL PARENTERAL NUTRITION CONSULT NOTE   Indication: Prolonged illeus  Patient Measurements: Height: 5\' 9"  (175.3 cm) Weight: 81.5 kg (179 lb 10.8 oz) IBW/kg (Calculated) : 70.7 TPN AdjBW (KG): 77.3 Body mass index is 26.53 kg/m.  Assessment: 83 y.o. 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   Glucose / Insulin :  BG 130 - 148 3u SSI last 24h A1C 6.5 Electrolytes: WNL Renal: SCr ~ 1, stable Hepatic: LFTs within normal limits TG: 121 Intake / Output;   Intake/Output Summary (Last 24 hours) at 04/06/2024 1026 Last data filed at 04/06/2024 1610 Gross per 24 hour  Intake 841.49 ml  Output 1050 ml  Net -208.51 ml    GI Imaging:Serial KUB prn 4/7: abd CT: postoperative ileus  4/10: abd XR: Persistent small-bowel dilatation  GI Surgeries / Procedures:  4/2-s/p surgery-robotic assisted laparoscopic loop ileostomy with decompression of cecum for Ogilvie Syndrome,   Central access: 03/18/24 TPN start date: 03/18/24>>4/5, resumed 4/9  Nutritional Goals: Goal TPN rate is 80 mL/hr (provides 105.6 g of protein and 2042.9 kcals per day)  RD Assessment: Estimated Needs Total Energy Estimated Needs: 1900-2200kcal/day Total Protein Estimated Needs: 95-110g/day Total Fluid Estimated Needs: 1.9-2.2L/day  Current Nutrition: NPO  Plan:  ---Continue custom TPN at 80 mL/hr  ---Electrolytes in TPN: Na 57mEq/L, K 31mEq/L, Ca 42mEq/L, Mg 74mEq/L, and Phos 15mmol/L. Cl:Ac 1:1 ---Add standard MVI and trace elements to TPN ---Continue Sensitive q6h SSI and adjust as needed  ---Monitor TPN labs on Mon/Thurs  Isis Costanza A Raylan Hanton, PharmD Clinical Pharmacist 04/06/2024

## 2024-04-06 NOTE — Progress Notes (Signed)
 Mobility Specialist - Progress Note   04/06/24 0903  Mobility  Activity Ambulated with assistance in hallway;Stood at bedside;Dangled on edge of bed  Level of Assistance Standby assist, set-up cues, supervision of patient - no hands on  Assistive Device Front wheel walker  Distance Ambulated (ft) 400 ft  Activity Response Tolerated well  Mobility Referral Yes  Mobility visit 1 Mobility  Mobility Specialist Start Time (ACUTE ONLY) 0825  Mobility Specialist Stop Time (ACUTE ONLY) 0846  Mobility Specialist Time Calculation (min) (ACUTE ONLY) 21 min   Pt supine in bed on RA upon arrival. Pt completes bed mobility indep. Pt STS and ambulates in hallway SBA with no LOB noted. Pt returns to recliner with needs in reach and chair alarm activated.   Wash Hack  Mobility Specialist  04/06/24 9:04 AM

## 2024-04-06 NOTE — Progress Notes (Signed)
 04/06/2024  Subjective: Patient is 17 Days Post-Op status post robotic assisted loop ileostomy creation.  No acute events overnight.  Patient has been tolerating a clear liquid diet and is having ostomy function with gas and stool in the bag.  NG tube remain in place but has been clamped and he has not had any issues with nausea or distention.  Vital signs: Temp:  [97.5 F (36.4 C)-97.8 F (36.6 C)] 97.5 F (36.4 C) (04/19 1418) Pulse Rate:  [63-72] 71 (04/19 1418) Resp:  [16-20] 18 (04/19 1418) BP: (103-131)/(60-71) 122/67 (04/19 1418) SpO2:  [94 %-97 %] 96 % (04/19 1418) Weight:  [81.5 kg] 81.5 kg (04/19 0455)   Intake/Output: 04/18 0701 - 04/19 0700 In: 841.5 [P.O.:240; I.V.:601.5] Out: 1200 [Urine:900; Stool:300] Last BM Date : 04/04/24  Physical Exam: Constitutional: No acute distress Abdomen: Soft, nondistended, appropriate sore to palpation.  Incisions are healing well and are clean, dry, intact.  Ostomy with air and stool in the bag.  NG tube in place has remained clamped.  Labs:  No results for input(s): "WBC", "HGB", "HCT", "PLT" in the last 72 hours. Recent Labs    04/04/24 0930  NA 134*  K 4.5  CL 105  CO2 23  GLUCOSE 124*  BUN 26*  CREATININE 0.90  CALCIUM  8.6*   No results for input(s): "LABPROT", "INR" in the last 72 hours.  Imaging: No results found.  Assessment/Plan: [name] s/p robotic assisted loop ileostomy.  - The patient has been doing better with toleration of clear liquid diet while having the NG tube clamped.  He continues having ostomy function and denies any worsening issues yesterday. - Discussed with patient that we could advance him to a full liquid diet and we have the option of either discontinuing his NG tube now or keeping it in place but clamped while he has his full liquid diet.  He would rather do the latter and will keep his NG tube today.  He will otherwise be advanced to full liquid diet. - Continue TPN for  nutrition. - Continue ambulation, out of bed.   Marene Shape, MD [facility]

## 2024-04-07 DIAGNOSIS — A419 Sepsis, unspecified organism: Secondary | ICD-10-CM | POA: Diagnosis not present

## 2024-04-07 DIAGNOSIS — R6520 Severe sepsis without septic shock: Secondary | ICD-10-CM | POA: Diagnosis not present

## 2024-04-07 LAB — GLUCOSE, CAPILLARY
Glucose-Capillary: 148 mg/dL — ABNORMAL HIGH (ref 70–99)
Glucose-Capillary: 142 mg/dL — ABNORMAL HIGH (ref 70–99)
Glucose-Capillary: 100 mg/dL — ABNORMAL HIGH (ref 70–99)
Glucose-Capillary: 123 mg/dL — ABNORMAL HIGH (ref 70–99)

## 2024-04-07 MED ORDER — DEXTROSE 70 % IV SOLN
INTRAVENOUS | Status: AC
  Filled 2024-04-07: qty 1056

## 2024-04-07 NOTE — Plan of Care (Signed)

## 2024-04-07 NOTE — Progress Notes (Signed)
 Progress Note    Todd Rojas  UJW:[id number] DOB: Sep 21, 1941  DOA: 03/09/2024 PCP: Antonio Baumgarten, MD      Brief Narrative:    Medical records reviewed and are as summarized below:  "Durell Lofaso is a 83 y.o. male  with history of CKD stage IIIa, chronic gout, hypertension, hyperlipidemia, interstitial pulmonary disease, bronchiectasis, COPD, who presents emergency department for chief concerns of fever, chills, shortness of breath for 2 weeks.   Vitals in the ED showed temperature of 98.4, respiration rate 25, heart rate 94, blood pressure initially 96/69 and improved to 107/66, SpO2 initially desatted to 88% on room air   Status post exploratory laparotomy with robotic assisted laparoscopic loop ileostomy and decompression of cecum on 4/2.  Tolerated procedure well.  No ischemic gut noted   4/7: Worsening nausea.  Inability to tolerate p.o.  Repeat KUB demonstrating ileus.  General surgery reengaged.  Ordering CT abdomen.   4/8: Conservative management per surgery.  No evidence of obstruction.  CT reassuring.  Diet scaled back to n.p.o. for now.  Clear liquids later today if stable."   Further hospital course and management as outlined below.  I assumed care on 04/03/24. NG tube in place for persistent ileus.  Surgery continues to follow closely.     Assessment/Plan:   Principal Problem:   Severe sepsis with acute organ dysfunction (HCC) Active Problems:   Fever   Acute hypoxic respiratory failure (HCC)   CAD (coronary artery disease)   Elevated troponin   Gaseous abdominal distention   COPD (chronic obstructive pulmonary disease) (HCC)   Elevated serum creatinine   Hypertension   Hyperlipidemia, unspecified   Primary osteoarthritis of both knees   Thyroid  nodule greater than or equal to 1.5 cm in diameter incidentally noted on imaging study   Dyslipidemia   Personal history of gout   Ogilvie syndrome   Protein-calorie malnutrition,  severe   Nutrition Problem: Severe Malnutrition Etiology: acute illness  Signs/Symptoms: mild fat depletion, moderate fat depletion, mild muscle depletion, moderate muscle depletion   Body mass index is 25.3 kg/m.   Gaseous abdominal distention 2/2 Colonic ileus  Ogilvie's syndrome Patient developed abdominal distention and crampy abdominal pain after presentation. Starting 3/25, Imaging with right colonic gaseous distention without any obvious obstruction.  Colonic distention has been worsening. --GenSurg and GI consulted, dx with Ogilvie's syndrome --neostigmine  2 mg push morning of 3/29 with Dr. Auston Left and Dr. Corky Diener present with passing of gas and BM afterwards, however, colonic distention unchanged, so pt underwent endoscopic decompression on 3/30.  Unfortunately colonic gas re-accumulated. --started TPN on 3/31 Robotic assisted laparoscopic loop ileostomy with decompression of cecum on 03/20/2024 --Was clinically improving then had deterioration on 4/7.  He was made n.p.o. and was subsequently transitioned to clear liquid diet. He had abdominal pain and vomiting on 03/27/2024 so NGT was replaced. NG tube successfully removed on 03/31/2024 He had recurrent nausea and vomiting on 04/01/2024.  Abdominal x-ray showed persistent gaseous distention so NG tube was reinserted on 04/01/2024. Abdominal x-ray 04/02/2024 showed persistent small bowel dilatation suggesting obstruction.  CT abdomen pelvis ordered for further evaluation. Started on TPN for nutrition on 03/27/2024.    4/17 -- NG tube clamp trial, Q4H check residuals  --Diet per surgery, currently NPO --Off IV fluids --Advanced to full liquid diet yesterday 4/19 --Diet per surgery --Continue TPN --Continue to progress ambulation --Follow-up with general surgery --Ostomy care & education/training   Bradycardia, recurrent Initially noted secondary to  neostigmine  as administration.  Heart rate was down to 20s.  Patient received  atropine .  Heart rate improved.  On 4/3 noted to be bradycardic, asymptomatic with heart rate in the 40s to 50s.  Beta-blockade was held. Use IV metoprolol  as needed    Severe sepsis with acute organ dysfunction (HCC) 2/2 PNA Elevated lactic acid of 2.6, increased respiration rate, with fever of 102 F at home, organ involvement are cardiac and pulmonary with hypoxic respiratory failure at 88% on room air, elevated high sensitive troponin of 98 and elevated BNP --Imaging concerning for right lower lobe pneumonia, although not much upper respiratory symptoms.  Procalcitonin at 0.352. Respiratory panels were negative. UA was not consistent with UTI Patient received broad-spectrum antibiotics Completed antibiotic course    Acute hypoxic respiratory failure (HCC) Resolved.  He is tolerating room air    Elevated troponin 2/2 demand ischemia First high sensitivity troponin was elevated at 98 >>101>175>226,  Suspect secondary to demand ischemia in setting of severe sepsis with acute hypoxic respiratory failure Echocardiogram with EF of 40% and regional wall motion abnormalities, discussed with cardiology and they think these abnormalities are chronic. Patient was also experiencing intermittent chest pain over the past couple of months which seems more exertional --cardio consulted, no plan for cardiac workup    CAD (coronary artery disease) With history of anterior STEMI in 2006, transferred to [facility] status post cardiac catheterization with PCI to mid LAD using Cypher stent; currently no longer on Plavix Continue aspirin  and statin    COPD (chronic obstructive pulmonary disease) (HCC) Bronchodilators as needed   Elevated serum creatinine Creatinine is stable.  Does not meet acute kidney injury criteria, suspect prerenal in setting of nausea vomiting Serum creatinine baseline is 1.35-1.36/eGFR 52-55, consistent with CKD stage III A Monitor BMP.  Minimize use of nephrotoxins  Insomnia -  suspect hospital environment, vs chronic --melatonin increase to 10 mg (no help with 5 mg) --change ambien  to benadryl  PRN     Thyroid  nodule greater than or equal to 1.5 cm in diameter incidentally noted on imaging study Incidental finding of a 2.7 cm thyroid  nodule. -Thyroid  ultrasound with solitary nodule.  Outpatient biopsy recommended --TSH wnl    Rash Developed morning of 3/28, all over chest, back and bilateral thighs.  Appeared to be allergic rxn.  Rash improved with prednisone .  Completed 5 days of prednisone  40 mg daily.   Type II DM, hyperglycemia:  NovoLog  as needed for hyperglycemia.  Hemoglobin A1c 6.5    Comorbidities include hypertension, hyperlipidemia, osteoarthritis of both knees s/p bilateral knee arthroplasty with mild right knee effusion, gout    Diet Order             Diet full liquid Room service appropriate? Yes; Fluid consistency: Thin  Diet effective now                    Consultants: General surgery Gastroenterology Cardiology  Procedures: Flexible sigmoidoscopy on 03/17/2024 Robotic assisted laparoscopic loop ileostomy with decompression of cecum on 03/20/2024    Medications:    allopurinol   300 mg Oral Daily   amLODipine   5 mg Oral Daily   aspirin   81 mg Oral Daily   Chlorhexidine  Gluconate Cloth  6 each Topical Daily   diclofenac  Sodium  2 g Topical TID   enoxaparin  (LOVENOX ) injection  40 mg Subcutaneous Q24H   guaiFENesin   600 mg Oral BID   insulin  aspart  0-9 Units Subcutaneous Q6H   melatonin  10  mg Oral QHS   mometasone -formoterol   2 puff Inhalation BID   multivitamin with minerals  1 tablet Oral Daily   pantoprazole  (PROTONIX ) IV  40 mg Intravenous Daily   sodium chloride  flush  10-40 mL Intracatheter Q12H   Continuous Infusions:  promethazine  (PHENERGAN ) injection (IM or IVPB) Stopped (04/04/24 0028)   TPN ADULT (ION) 80 mL/hr at 04/06/24 1817   TPN ADULT (ION)       Anti-infectives (From admission, onward)     Start     Dose/Rate Route Frequency Ordered Stop   03/20/24 1315  cefoTEtan  (CEFOTAN ) 2 g in sodium chloride  0.9 % 100 mL IVPB  Status:  Discontinued        2 g 200 mL/hr over 30 Minutes Intravenous  Once 03/20/24 1228 03/24/24 0924   03/20/24 0830  cefoTEtan  (CEFOTAN ) 2 g in sodium chloride  0.9 % 100 mL IVPB        2 g 200 mL/hr over 30 Minutes Intravenous  Once 03/19/24 1739 03/20/24 0825   03/13/24 1200  levofloxacin  (LEVAQUIN ) tablet 750 mg        750 mg Oral Daily 03/13/24 1148 03/15/24 1413   03/10/24 1600  vancomycin  (VANCOREADY) IVPB 1250 mg/250 mL  Status:  Discontinued        1,250 mg 166.7 mL/hr over 90 Minutes Intravenous Every 24 hours 03/10/24 1056 03/10/24 1250   03/10/24 0945  azithromycin  (ZITHROMAX ) 500 mg in sodium chloride  0.9 % 250 mL IVPB        500 mg 250 mL/hr over 60 Minutes Intravenous Every 24 hours 03/10/24 0934 03/12/24 1024   03/10/24 0200  metroNIDAZOLE  (FLAGYL ) IVPB 500 mg  Status:  Discontinued        500 mg 100 mL/hr over 60 Minutes Intravenous Every 12 hours 03/09/24 1515 03/10/24 0933   03/10/24 0000  ceFEPIme  (MAXIPIME ) 2 g in sodium chloride  0.9 % 100 mL IVPB  Status:  Discontinued        2 g 200 mL/hr over 30 Minutes Intravenous Every 12 hours 03/09/24 1541 03/13/24 1148   03/09/24 1600  vancomycin  (VANCOCIN ) IVPB 1000 mg/200 mL premix        1,000 mg 200 mL/hr over 60 Minutes Intravenous  Once 03/09/24 1537 03/09/24 1834   03/09/24 1537  vancomycin  variable dose per unstable renal function (pharmacist dosing)  Status:  Discontinued         Does not apply See admin instructions 03/09/24 1537 03/10/24 1056   03/09/24 1515  metroNIDAZOLE  (FLAGYL ) IVPB 500 mg  Status:  Discontinued        500 mg 100 mL/hr over 60 Minutes Intravenous Every 12 hours 03/09/24 1504 03/09/24 1515   03/09/24 1200  ceFEPIme  (MAXIPIME ) 2 g in sodium chloride  0.9 % 100 mL IVPB        2 g 200 mL/hr over 30 Minutes Intravenous  Once 03/09/24 1152 03/09/24 1304   03/09/24  1200  metroNIDAZOLE  (FLAGYL ) IVPB 500 mg        500 mg 100 mL/hr over 60 Minutes Intravenous  Once 03/09/24 1152 03/09/24 1505   03/09/24 1200  vancomycin  (VANCOCIN ) IVPB 1000 mg/200 mL premix        1,000 mg 200 mL/hr over 60 Minutes Intravenous  Once 03/09/24 1152 03/09/24 1523          Family Communication/Anticipated D/C date and plan/Code Status   DVT prophylaxis: enoxaparin  (LOVENOX ) injection 40 mg Start: 03/26/24 2200 Place TED hose Start: 03/09/24 1502  Code Status: Full Code  Family Communication: Plan discussed with Lamond Pilot, wife, at the bedside bedside.  All her questions were answered.  Disposition Plan: SNF versus HH   Status is: Inpatient Remains inpatient appropriate because: d/c pending clearance by general surgery and further advancement of diet.    Subjective:   Pt seen bedside this AM, awake sitting up in bed having full liquid breakfast. NG tube was recently removed this AM.  Pt denies abdominal pain worsening distention or nausea/vomiting.   Objective:    Vitals:   04/06/24 1955 04/07/24 0410 04/07/24 0452 04/07/24 0913  BP: 121/66 116/63  112/61  Pulse: 70 (!) 56  67  Resp: 16 16  16   Temp: 97.7 F (36.5 C) (!) 97.4 F (36.3 C)  (!) 96.9 F (36.1 C)  TempSrc: Oral Oral  Axillary  SpO2: 97% 96%  97%  Weight:   77.7 kg   Height:       No data found.   Intake/Output Summary (Last 24 hours) at 04/07/2024 1250 Last data filed at 04/07/2024 0913 Gross per 24 hour  Intake 1238.79 ml  Output 450 ml  Net 788.79 ml   Filed Weights   04/05/24 0500 04/06/24 0455 04/07/24 0452  Weight: 80.2 kg 81.5 kg 77.7 kg    Exam:    General exam: awake, alert, no acute distress HEENT: moist mucus membranes, hearing grossly normal, NG tube has been removed Respiratory system: on room air normal respiratory effort. Cardiovascular system: RRR, no pedal edema.   Gastrointestinal system: ostomy bag present with gas and stool in bag, surgical  incisions appear healthy, soft and non-distended Central nervous system: A&O x 3. no gross focal neurologic deficits, normal speech Extremities: moves all, no edema, normal tone Psychiatry: normal mood, congruent affect, judgement and insight appear normal     Data Reviewed:   I have personally reviewed following labs and imaging studies:  Labs: Labs show the following:   Basic Metabolic Panel: Recent Labs  Lab 04/01/24 0747 04/02/24 0503 04/04/24 0930  NA 137 137 134*  K 4.8 4.6 4.5  CL 108 109 105  CO2 22 23 23   GLUCOSE 147* 120* 124*  BUN 30* 31* 26*  CREATININE 0.88 0.89 0.90  CALCIUM  9.1 8.7* 8.6*  MG 2.1  --  2.0  PHOS 3.3  --  3.5   GFR Estimated Creatinine Clearance: 62.2 mL/min (by C-G formula based on SCr of 0.9 mg/dL). Liver Function Tests: Recent Labs  Lab 04/01/24 0747 04/04/24 0930  AST 22 15  ALT 28 21  ALKPHOS 74 63  BILITOT 0.5 0.4  PROT 6.8 6.0*  ALBUMIN  2.9* 2.6*   No results for input(s): "LIPASE", "AMYLASE" in the last 168 hours. No results for input(s): "AMMONIA" in the last 168 hours. Coagulation profile No results for input(s): "INR", "PROTIME" in the last 168 hours.  CBC: Recent Labs  Lab 04/02/24 0503  WBC 10.3  HGB 11.9*  HCT 37.3*  MCV 102.2*  PLT 202   Cardiac Enzymes: No results for input(s): "CKTOTAL", "CKMB", "CKMBINDEX", "TROPONINI" in the last 168 hours. BNP (last 3 results) No results for input(s): "PROBNP" in the last 8760 hours. CBG: Recent Labs  Lab 04/06/24 1122 04/06/24 1805 04/06/24 2311 04/07/24 0508 04/07/24 1242  GLUCAP 143* 151* 137* 148* 142*   D-Dimer: No results for input(s): "DDIMER" in the last 72 hours. Hgb A1c: No results for input(s): "HGBA1C" in the last 72 hours.  Lipid Profile: No results for input(s): "CHOL", "HDL", "  LDLCALC", "TRIG", "CHOLHDL", "LDLDIRECT" in the last 72 hours.   Thyroid  function studies: No results for input(s): "TSH", "T4TOTAL", "T3FREE", "THYROIDAB" in the  last 72 hours.  Invalid input(s): "FREET3" Anemia work up: No results for input(s): "VITAMINB12", "FOLATE", "FERRITIN", "TIBC", "IRON", "RETICCTPCT" in the last 72 hours. Sepsis Labs: Recent Labs  Lab 04/02/24 0503  WBC 10.3    Microbiology No results found for this or any previous visit (from the past 240 hours).  Procedures and diagnostic studies:  No results found.               LOS: 29 days   Montey Apa  [facility]   Pager on [email or web address]. If 7PM-7AM, please contact night-coverage at [email or web address]     04/07/2024, 12:50 PM

## 2024-04-07 NOTE — Progress Notes (Signed)
 PHARMACY - TOTAL PARENTERAL NUTRITION CONSULT NOTE   Indication: Prolonged illeus  Patient Measurements: Height: 5\' 9"  (175.3 cm) Weight: 77.7 kg (171 lb 4.8 oz) IBW/kg (Calculated) : 70.7 TPN AdjBW (KG): 77.3 Body mass index is 25.3 kg/m.  Assessment: 83 y.o. 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   Glucose / Insulin :  BG 130 - 151 4u SSI last 24h A1C 6.5 Electrolytes: WNL Renal: SCr ~ 1, stable Hepatic: LFTs within normal limits TG: 121 Intake / Output;   Intake/Output Summary (Last 24 hours) at 04/07/2024 0956 Last data filed at 04/07/2024 0913 Gross per 24 hour  Intake 1238.79 ml  Output 450 ml  Net 788.79 ml    GI Imaging:Serial KUB prn 4/7: abd CT: postoperative ileus  4/10: abd XR: Persistent small-bowel dilatation  GI Surgeries / Procedures:  4/2-s/p surgery-robotic assisted laparoscopic loop ileostomy with decompression of cecum for Ogilvie Syndrome,   Central access: 03/18/24 TPN start date: 03/18/24>>4/5, resumed 4/9  Nutritional Goals: Goal TPN rate is 80 mL/hr (provides 105.6 g of protein and 2042.9 kcals per day)  RD Assessment: Estimated Needs Total Energy Estimated Needs: 1900-2200kcal/day Total Protein Estimated Needs: 95-110g/day Total Fluid Estimated Needs: 1.9-2.2L/day  Current Nutrition: NPO  Plan:  ---Continue custom TPN at 80 mL/hr  ---Electrolytes in TPN: Na 17mEq/L, K 21mEq/L, Ca 15mEq/L, Mg 26mEq/L, and Phos 15mmol/L. Cl:Ac 1:1 ---Add standard MVI and trace elements to TPN ---Continue Sensitive q6h SSI and adjust as needed  ---Monitor TPN labs on Mon/Thurs  Cornel Werber A Yobana Culliton, PharmD Clinical Pharmacist 04/07/2024

## 2024-04-07 NOTE — Plan of Care (Signed)
  Problem: Pain Managment: Goal: General experience of comfort will improve and/or be controlled Outcome: Progressing   Problem: Safety: Goal: Ability to remain free from injury will improve Outcome: Progressing   Problem: Skin Integrity: Goal: Risk for impaired skin integrity will decrease Outcome: Progressing

## 2024-04-08 DIAGNOSIS — A419 Sepsis, unspecified organism: Secondary | ICD-10-CM | POA: Diagnosis not present

## 2024-04-08 DIAGNOSIS — R6520 Severe sepsis without septic shock: Secondary | ICD-10-CM | POA: Diagnosis not present

## 2024-04-08 LAB — COMPREHENSIVE METABOLIC PANEL WITH GFR
ALT: 28 U/L (ref 0–44)
AST: 19 U/L (ref 15–41)
Albumin: 2.9 g/dL — ABNORMAL LOW (ref 3.5–5.0)
Alkaline Phosphatase: 89 U/L (ref 38–126)
Anion gap: 8 (ref 5–15)
BUN: 29 mg/dL — ABNORMAL HIGH (ref 8–23)
CO2: 22 mmol/L (ref 22–32)
Calcium: 8.9 mg/dL (ref 8.9–10.3)
Chloride: 103 mmol/L (ref 98–111)
Creatinine, Ser: 0.78 mg/dL (ref 0.61–1.24)
GFR, Estimated: >60 mL/min (ref >60)
Glucose, Bld: 121 mg/dL — ABNORMAL HIGH (ref 70–99)
Potassium: 4.5 mmol/L (ref 3.5–5.1)
Sodium: 133 mmol/L — ABNORMAL LOW (ref 135–145)
Total Bilirubin: 0.6 mg/dL (ref 0.0–1.2)
Total Protein: 6.5 g/dL (ref 6.5–8.1)

## 2024-04-08 LAB — MAGNESIUM: Magnesium: 1.9 mg/dL (ref 1.7–2.4)

## 2024-04-08 LAB — GLUCOSE, CAPILLARY
Glucose-Capillary: 114 mg/dL — ABNORMAL HIGH (ref 70–99)
Glucose-Capillary: 118 mg/dL — ABNORMAL HIGH (ref 70–99)
Glucose-Capillary: 91 mg/dL (ref 70–99)

## 2024-04-08 LAB — PHOSPHORUS: Phosphorus: 4 mg/dL (ref 2.5–4.6)

## 2024-04-08 LAB — TRIGLYCERIDES: Triglycerides: 146 mg/dL (ref <150)

## 2024-04-08 MED ORDER — ACETAMINOPHEN 325 MG PO TABS
650.0000 mg | ORAL_TABLET | Freq: Four times a day (QID) | ORAL | Status: DC | PRN
  Administered 2024-04-08: 650 mg via ORAL
  Filled 2024-04-08: qty 2

## 2024-04-08 MED ORDER — INSULIN ASPART 100 UNIT/ML IJ SOLN
0.0000 [IU] | Freq: Three times a day (TID) | INTRAMUSCULAR | Status: DC
  Filled 2024-04-08: qty 1

## 2024-04-08 MED ORDER — TRAVASOL 10 % IV SOLN
INTRAVENOUS | Status: DC
Start: 2024-04-08 — End: 2024-04-09
  Filled 2024-04-08: qty 528

## 2024-04-08 MED ORDER — PANTOPRAZOLE SODIUM 40 MG PO TBEC
40.0000 mg | DELAYED_RELEASE_TABLET | Freq: Every day | ORAL | Status: DC
Start: 2024-04-09 — End: 2024-04-09
  Administered 2024-04-09: 40 mg via ORAL
  Filled 2024-04-08: qty 1

## 2024-04-08 NOTE — Progress Notes (Signed)
 Occupational Therapy Treatment Patient Details Name: Todd Rojas MRN: [id number] DOB: 1941/03/27 Today's Date: 04/08/2024   History of present illness Pt is an 83 year old male   MD work up including sepsis, acute hypoxic respiratory failure, PNA    Pmh significant for CKD stage IIIa, chronic gout, hypertension, hyperlipidemia, interstitial pulmonary disease, bronchiectasis, COPD. Pt is s/p exploratory lapileostomy and decompression of cecum on 4/2.   OT comments  Upon entering the room, pt supine in bed and agreeable to OT intervention. Pt reports feeling much better and has better affect overall this session. Pt performing bed mobility without assistance but increased effort. Pt able to demonstrate figure four position to doff dirty socks and don clean ones without assistance. Pt stands with RW and supervision and is able to demonstrate ability to pick up items from floor with cues for technique and safety. Pt returning to bed at end of session with call bell and all needed items within reach.       If plan is discharge home, recommend the following:  A little help with walking and/or transfers;A little help with bathing/dressing/bathroom;Assistance with cooking/housework;Assist for transportation;Help with stairs or ramp for entrance   Equipment Recommendations  BSC/3in1;Tub/shower seat       Precautions / Restrictions Precautions Precautions: Fall Recall of Precautions/Restrictions: Intact       Mobility Bed Mobility Overal bed mobility: Modified Independent             General bed mobility comments: increased time and effort but no physical assistance needed    Transfers Overall transfer level: Needs assistance Equipment used: Rolling walker (2 wheels) Transfers: Sit to/from Stand Sit to Stand: Supervision                 Balance Overall balance assessment: Needs assistance Sitting-balance support: Feet supported, Bilateral upper extremity  supported Sitting balance-Leahy Scale: Good     Standing balance support: Bilateral upper extremity supported, During functional activity, Reliant on assistive device for balance Standing balance-Leahy Scale: Good                             ADL either performed or assessed with clinical judgement   ADL                                         General ADL Comments: Pt able to perform figure four position to don and doff B socks while seated on EOB.    Extremity/Trunk Assessment Upper Extremity Assessment Upper Extremity Assessment: Generalized weakness   Lower Extremity Assessment Lower Extremity Assessment: Generalized weakness        Vision Patient Visual Report: No change from baseline               Cognition Arousal: Alert Behavior During Therapy: WFL for tasks assessed/performed Cognition: No apparent impairments Difficult to assess due to: Hard of hearing/deaf                                                    Pertinent Vitals/ Pain       Pain Assessment Pain Assessment: No/denies pain         Frequency  Min 2X/week  Progress Toward Goals  OT Goals(current goals can now be found in the care plan section)  Progress towards OT goals: Progressing toward goals  Acute Rehab OT Goals Time For Goal Achievement: 04/22/24 Potential to Achieve Goals: Good  Plan         AM-PAC OT "6 Clicks" Daily Activity     Outcome Measure   Help from another person eating meals?: None Help from another person taking care of personal grooming?: None Help from another person toileting, which includes using toliet, bedpan, or urinal?: A Little Help from another person bathing (including washing, rinsing, drying)?: A Little Help from another person to put on and taking off regular upper body clothing?: A Little Help from another person to put on and taking off regular lower body clothing?: A Little 6 Click Score:  20    End of Session Equipment Utilized During Treatment: Rolling walker (2 wheels)  OT Visit Diagnosis: Other abnormalities of gait and mobility (R26.89)   Activity Tolerance Patient tolerated treatment well   Patient Left in bed;with call bell/phone within reach;with bed alarm set   Nurse Communication Mobility status        Time: 1610-9604 OT Time Calculation (min): 18 min  Charges: OT General Charges $OT Visit: 1 Visit OT Treatments $Self Care/Home Management : 8-22 mins  George Kinder, MS, OTR/L , CBIS ascom [phone]  04/08/24, 4:09 PM

## 2024-04-08 NOTE — Plan of Care (Signed)
  Problem: Pain Managment: Goal: General experience of comfort will improve and/or be controlled Outcome: Progressing   Problem: Safety: Goal: Ability to remain free from injury will improve Outcome: Progressing   Problem: Skin Integrity: Goal: Risk for impaired skin integrity will decrease Outcome: Progressing

## 2024-04-08 NOTE — Progress Notes (Signed)
 Mobility Specialist - Progress Note   04/08/24 1507  Mobility  Activity Ambulated with assistance in hallway  Level of Assistance Standby assist, set-up cues, supervision of patient - no hands on  Assistive Device Front wheel walker  Distance Ambulated (ft) 320 ft  Activity Response Tolerated well  Mobility visit 1 Mobility  Mobility Specialist Start Time (ACUTE ONLY) 1453  Mobility Specialist Stop Time (ACUTE ONLY) 1508  Mobility Specialist Time Calculation (min) (ACUTE ONLY) 15 min   Pt supine upon entry, utilizing RA. Pt motivated and agreeable to OOB amb this date. Pt completed bed mob ModI, STS to RW and amb two laps around the NS w/ close supervision. Pt returned to the room, left supine with alarm set and needs within reach.  Versa Gore Mobility Specialist 04/08/24 3:14 PM

## 2024-04-08 NOTE — Progress Notes (Signed)
 Nutrition Follow Up Note   DOCUMENTATION CODES:   Severe malnutrition in context of acute illness/injury  INTERVENTION:   Continue TPN per pharmacy- currently at half rate   Vital Cuisine po TID, each supplement provides 520kcal and 22g of protein.   Ice cream with meal trays   MVI po daily   Daily weights    NUTRITION DIAGNOSIS:   Severe Malnutrition related to acute illness as evidenced by mild fat depletion, moderate fat depletion, mild muscle depletion, moderate muscle depletion. -ongoing   GOAL:   Patient will meet greater than or equal to 90% of their needs -met   MONITOR:   PO intake, Supplement acceptance, Labs, Weight trends, Skin, I & O's, TPN  ASSESSMENT:   83 y/o male with h/o CAD, COPD, HLD, gout, DDD, GERD, CHF, thyroid  nodule, kidney stones, B12 deficiency and CKD III who is admitted with PNA, sepsis and Ogilvie syndrome s/p colonic decompression 3/30 & s/p robotic assisted laparoscopic loop ileostomy with decompression of cecum 4/2 complicated by post op ileus.  Met with pt in room today. Pt is good spirits today and making jokes. Pt reports that he is feeling much better. Pt denies any nausea, vomiting or abdominal pain. Pt is having bowel function. Pt reports that his oral intake remains poor. Pt ate 50% of his breakfast this morning. Pt is reluctant to drink any supplements; pt did not like the [employer], Ensure, Nepro or the [employer]. Pt is asking for a Frosty. RD will add supplements to meal trays. TPN decreased to half rate tonight with plans to discontinue tomorrow. Pt developed malnutrition in the hospital. Per chart, pt appears weight stable since restarting the TPN.   Medications reviewed and include: allopurinol , aspirin , lovenox , insulin , melatonin, MVI, protonix , TPN  Labs reviewed: Na 133(L), K 4.5 wnl, BUN 29(H), P 4.0 wnl, Mg 1.9 wnl Triglycerides 146 Cbgs- 91, 123 x 48 hrs   UOP-   Diet Order:    Diet Order              DIET SOFT Fluid consistency: Thin  Diet effective now                  EDUCATION NEEDS:   Education needs have been addressed  Skin:  Skin Assessment: Reviewed RN Assessment (incision abdomen)  Last BM:  4/21- via ostomy  Height:   Ht Readings from Last 1 Encounters:  03/15/24 5\' 9"  (1.753 m)    Weight:   Wt Readings from Last 1 Encounters:  04/07/24 77.7 kg    Ideal Body Weight:  72.7 kg  BMI:  Body mass index is 25.3 kg/m.  Estimated Nutritional Needs:   Kcal:  2000-2300kcal/day  Protein:  100-115g/day  Fluid:  1.9-2.2L/day  Torrance Freestone MS, RD, LDN If unable to be reached, please send secure chat to "RD inpatient" available from 8:00a-4:00p daily

## 2024-04-08 NOTE — Evaluation (Signed)
 Physical Therapy Re-Evaluation Patient Details Name: Todd Rojas MRN: [id number] DOB: 03/17/1941 Today's Date: 04/08/2024  History of Present Illness  Pt is an 83 year old male   MD work up including sepsis, acute hypoxic respiratory failure, PNA    Pmh significant for CKD stage IIIa, chronic gout, hypertension, hyperlipidemia, interstitial pulmonary disease, bronchiectasis, COPD. Pt is s/p exploratory lapileostomy and decompression of cecum on 4/2.   Clinical Impression  Patient alert, agreeable to PT, denied pain. Standing with tech in room with RW. He was able to ambulate ~419ft total with RW and supervision, 1-2 standing rest breaks for conversation but no LOB, steady. Pt did appear tremulous towards end of ambulation but denied lightheadedness/dizziness. He was also able to perform stair navigation with CGA and RW, family present to assist with technique at home. The patient would benefit from further skilled PT intervention to continue to progress towards goals.         If plan is discharge home, recommend the following: Assist for transportation;Help with stairs or ramp for entrance;A little help with bathing/dressing/bathroom;A little help with walking and/or transfers;Assistance with cooking/housework   Can travel by private vehicle   Yes    Equipment Recommendations Rolling walker (2 wheels);BSC/3in1  Recommendations for Other Services       Functional Status Assessment Patient has had a recent decline in their functional status and demonstrates the ability to make significant improvements in function in a reasonable and predictable amount of time.     Precautions / Restrictions Precautions Precautions: Fall Recall of Precautions/Restrictions: Intact Restrictions Weight Bearing Restrictions Per Provider Order: No      Mobility  Bed Mobility               General bed mobility comments: pt standing in room with tech upon PT arrival    Transfers Overall  transfer level: Needs assistance Equipment used: Rolling walker (2 wheels) Transfers: Sit to/from Stand Sit to Stand: Supervision           General transfer comment: cues for RW position    Ambulation/Gait Ambulation/Gait assistance: Supervision Gait Distance (Feet): 400 Feet Assistive device: Rolling walker (2 wheels)         General Gait Details: no LOB or safety concerns. does appear tremulous over time  Stairs Stairs: Yes Stairs assistance: Contact guard assist Stair Management: No rails, Forwards, With walker Number of Stairs: 1 General stair comments: navigated up/down one step, with family present with RW  Wheelchair Mobility     Tilt Bed    Modified Rankin (Stroke Patients Only)       Balance Overall balance assessment: Needs assistance Sitting-balance support: Feet supported, Bilateral upper extremity supported Sitting balance-Leahy Scale: Good     Standing balance support: Bilateral upper extremity supported, During functional activity, Reliant on assistive device for balance Standing balance-Leahy Scale: Fair                               Pertinent Vitals/Pain Pain Assessment Pain Assessment: No/denies pain    Home Living Family/patient expects to be discharged to:: Private residence Living Arrangements: Spouse/significant other Available Help at Discharge: Family Type of Home: House Home Access: Stairs to enter Entrance Stairs-Rails: None Entrance Stairs-Number of Steps: 3   Home Layout: One level Home Equipment: Agricultural consultant (2 wheels);Cane - single point      Prior Function Prior Level of Function : Independent/Modified Independent  Mobility Comments: amb with no AD ADLs Comments: MOD I-I in ADL/IADL recently processed 7 deer, gardens     Extremity/Trunk Assessment   Upper Extremity Assessment Upper Extremity Assessment: Generalized weakness    Lower Extremity Assessment Lower Extremity  Assessment: Generalized weakness       Communication   Communication Communication: Impaired Factors Affecting Communication: Hearing impaired    Cognition Arousal: Alert Behavior During Therapy: WFL for tasks assessed/performed   PT - Cognitive impairments: No apparent impairments                         Following commands: Intact       Cueing       General Comments      Exercises     Assessment/Plan    PT Assessment Patient needs continued PT services  PT Problem List Decreased strength;Decreased activity tolerance;Decreased range of motion;Decreased balance;Decreased mobility;Decreased knowledge of use of DME       PT Treatment Interventions DME instruction;Balance training;Gait training;Neuromuscular re-education;Stair training;Functional mobility training;Patient/family education;Therapeutic activities;Therapeutic exercise    PT Goals (Current goals can be found in the Care Plan section)  Acute Rehab PT Goals Patient Stated Goal: to feel better PT Goal Formulation: With patient Time For Goal Achievement: 04/22/24 Potential to Achieve Goals: Good    Frequency Min 2X/week     Co-evaluation               AM-PAC PT "6 Clicks" Mobility  Outcome Measure Help needed turning from your back to your side while in a flat bed without using bedrails?: None Help needed moving from lying on your back to sitting on the side of a flat bed without using bedrails?: None Help needed moving to and from a bed to a chair (including a wheelchair)?: None Help needed standing up from a chair using your arms (e.g., wheelchair or bedside chair)?: A Little Help needed to walk in hospital room?: A Little Help needed climbing 3-5 steps with a railing? : A Little 6 Click Score: 21    End of Session Equipment Utilized During Treatment: Gait belt Activity Tolerance: Patient tolerated treatment well Patient left: with call bell/phone within reach;in chair;with  family/visitor present Nurse Communication: Mobility status PT Visit Diagnosis: Other abnormalities of gait and mobility (R26.89);Difficulty in walking, not elsewhere classified (R26.2);Muscle weakness (generalized) (M62.81)    Time: 8657-8469 PT Time Calculation (min) (ACUTE ONLY): 15 min   Charges:   PT Evaluation $PT Re-evaluation: 1 Re-eval PT Treatments $Therapeutic Activity: 8-22 mins PT General Charges $$ ACUTE PT VISIT: 1 Visit        Darien Eden PT, DPT 11:29 AM,04/08/24

## 2024-04-08 NOTE — Progress Notes (Signed)
 PHARMACY - TOTAL PARENTERAL NUTRITION CONSULT NOTE   Indication: Prolonged illeus  Patient Measurements: Height: 5\' 9"  (175.3 cm) Weight: 77.7 kg (171 lb 4.8 oz) IBW/kg (Calculated) : 70.7 TPN AdjBW (KG): 77.3 Body mass index is 25.3 kg/m.  Assessment: 83 y.o. 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   Glucose / Insulin :  BG 91 - 151 3u SSI last 24h A1C 6.5 Electrolytes: Mild hyponatremia Renal: Scr < 1 Hepatic: LFTs within normal limits TG: 146 Intake / Output;   Intake/Output Summary (Last 24 hours) at 04/08/2024 4098 Last data filed at 04/07/2024 2014 Gross per 24 hour  Intake 874.64 ml  Output 1850 ml  Net -975.36 ml    GI Imaging:Serial KUB prn 4/7: abd CT: postoperative ileus  4/10: abd XR: Persistent small-bowel dilatation  GI Surgeries / Procedures:  4/2-s/p surgery-robotic assisted laparoscopic loop ileostomy with decompression of cecum for Ogilvie Syndrome,   Central access: 03/18/24 TPN start date: 03/18/24 >> 4/5, resumed 4/9  Nutritional Goals: Goal TPN rate is 80 mL/hr (provides 105.6 g of protein and 2042.9 kcals per day)  RD Assessment: Estimated Needs Total Energy Estimated Needs: 1900-2200kcal/day Total Protein Estimated Needs: 95-110g/day Total Fluid Estimated Needs: 1.9-2.2L/day  Current Nutrition: NPO  Plan:  --Wean TPN in half to 40 mL/hr per discussion with surgery team. Plan to stop 04/09/24 --Electrolytes in TPN: Na 66mEq/L, K 35mEq/L, Ca 58mEq/L, Mg 5mEq/L, and Phos 15mmol/L. Cl:Ac 1:1 --Add standard MVI and trace elements to TPN --Decrease to Sensitive q8h SSI and adjust as needed  --Monitor TPN labs on Mon/Thurs  Page Boast 04/08/2024

## 2024-04-08 NOTE — Progress Notes (Signed)
 [facility] SURGICAL PROGRESS NOTE  Hospital Day(s): 30.   Post op day(s): 19 Days Post-Op.   Interval History:  Patient seen and examined Patient reports he is feeling much better No abdominal pain  No fever, chills, nausea, emesis  Renal function normal; sCr - 0.78; UO - 1000 ccs + unmeasured  No electrolyte derangements  Ileostomy with 1100 ccs out recorded Soft diet; tolerating   Vital signs in last 24 hours: [min-max] current  Temp:  [96.9 F (36.1 C)-97.8 F (36.6 C)] 97.7 F (36.5 C) (04/21 0410) Pulse Rate:  [64-67] 65 (04/21 0410) Resp:  [16-18] 18 (04/21 0410) BP: (112-125)/(61-65) 125/62 (04/21 0410) SpO2:  [96 %-97 %] 96 % (04/21 0410)     Height: 5\' 9"  (175.3 cm) Weight: 77.7 kg BMI (Calculated): 25.28   Intake/Output last 2 shifts:  04/20 0701 - 04/21 0700 In: 2113.4 [P.O.:300; I.V.:1713.4; IV Piggyback:50] Out: 2100 [Urine:1000; Stool:1100]   Physical Exam:  Constitutional: alert, awake, NAD Respiratory: breathing non-labored at rest  Cardiovascular: regular rate and sinus rhythm  Gastrointestinal: soft, he does not appear overtly tender, no rebound/guarding. Ileostomy in RLQ, stool and gas in bag this AM Integumentary: Laparoscopic incisions are well healed   Labs:     Latest Ref Rng & Units 04/02/2024    5:03 AM 03/29/2024    7:35 AM 03/27/2024    6:48 AM  CBC  WBC 4.0 - 10.5 K/uL 10.3  6.4  10.3   Hemoglobin 13.0 - 17.0 g/dL 86.5  78.4  69.6   Hematocrit 39.0 - 52.0 % 37.3  41.1  40.7   Platelets 150 - 400 K/uL 202  224  243       Latest Ref Rng & Units 04/08/2024    4:46 AM 04/04/2024    9:30 AM 04/02/2024    5:03 AM  CMP  Glucose 70 - 99 mg/dL 295  284  132   BUN 8 - 23 mg/dL 29  26  31    Creatinine 0.61 - 1.24 mg/dL 4.40  1.02  7.25   Sodium 135 - 145 mmol/L 133  134  137   Potassium 3.5 - 5.1 mmol/L 4.5  4.5  4.6   Chloride 98 - 111 mmol/L 103  105  109   CO2 22 - 32 mmol/L 22  23  23    Calcium  8.9 - 10.3 mg/dL 8.9  8.6   8.7   Total Protein 6.5 - 8.1 g/dL 6.5  6.0    Total Bilirubin 0.0 - 1.2 mg/dL 0.6  0.4    Alkaline Phos 38 - 126 U/L 89  63    AST 15 - 41 U/L 19  15    ALT 0 - 44 U/L 28  21       Imaging studies: No new imaging studies    Assessment/Plan:  83 y.o. male with resolving post-operative ileus 19 Days Post-Op s/p robotic assisted laparoscopic loop ileostomy with decompression of cecum for Ogilvie Syndrome, complicated by pertinent comorbidities including COPD, asthma, CAD, history of MI, CHF, HTN, HLD.    - He can continue soft diet as tolerated  - We can wean TPN today with goal of stopping this tomorrow (04/22)  - No need for emergent surgical intervention   - Monitor abdominal examination;  - Monitor on-going ileostomy function; continue to quantify this, goal of 1-1.5L daily. He will be at risk of dehydration, may need imodium    - Pain control prn; minimize narcotics as feasible - Antiemetics  prn   - Encouraged frequent mobilization as feasible - therapies following, walked 300 feet per notes  - Further management per primary service; we will follow   - Discharge Planning: He is doing well. Will need 24 hours to wean TPN. From a surgical perspective, he can discharge potentially tomorrow (04/22).   All of the above findings and recommendations were discussed with the patient, patient's family (wife at bedside), and the medical team, and all of their questions were answered to their expressed satisfaction.  -- Apolonio Bay, PA-C  [facility] 04/08/2024, 7:21 AM M-F: 7am - 4pm

## 2024-04-08 NOTE — Progress Notes (Signed)
 Progress Note    Todd Rojas  ZOX:[id number] DOB: 10-22-1941  DOA: 03/09/2024 PCP: Antonio Baumgarten, MD      Brief Narrative:    Medical records reviewed and are as summarized below:  "Todd Rojas is a 83 y.o. male  with history of CKD stage IIIa, chronic gout, hypertension, hyperlipidemia, interstitial pulmonary disease, bronchiectasis, COPD, who presents emergency department for chief concerns of fever, chills, shortness of breath for 2 weeks.   Vitals in the ED showed temperature of 98.4, respiration rate 25, heart rate 94, blood pressure initially 96/69 and improved to 107/66, SpO2 initially desatted to 88% on room air   Status post exploratory laparotomy with robotic assisted laparoscopic loop ileostomy and decompression of cecum on 4/2.  Tolerated procedure well.  No ischemic gut noted   4/7: Worsening nausea.  Inability to tolerate p.o.  Repeat KUB demonstrating ileus.  General surgery reengaged.  Ordering CT abdomen.   4/8: Conservative management per surgery.  No evidence of obstruction.  CT reassuring.  Diet scaled back to n.p.o. for now.  Clear liquids later today if stable."   Further hospital course and management as outlined below.  I assumed care on 04/03/24. NG tube in place for persistent ileus.  Surgery continues to follow closely.     Assessment/Plan:   Principal Problem:   Severe sepsis with acute organ dysfunction (HCC) Active Problems:   Fever   Acute hypoxic respiratory failure (HCC)   CAD (coronary artery disease)   Elevated troponin   Gaseous abdominal distention   COPD (chronic obstructive pulmonary disease) (HCC)   Elevated serum creatinine   Hypertension   Hyperlipidemia, unspecified   Primary osteoarthritis of both knees   Thyroid  nodule greater than or equal to 1.5 cm in diameter incidentally noted on imaging study   Dyslipidemia   Personal history of gout   Ogilvie syndrome   Protein-calorie malnutrition,  severe   Nutrition Problem: Severe Malnutrition Etiology: acute illness  Signs/Symptoms: mild fat depletion, moderate fat depletion, mild muscle depletion, moderate muscle depletion   Body mass index is 25.3 kg/m.   Gaseous abdominal distention 2/2 Colonic ileus  Ogilvie's syndrome Patient developed abdominal distention and crampy abdominal pain after presentation. Starting 3/25, Imaging with right colonic gaseous distention without any obvious obstruction.  Colonic distention has been worsening. --GenSurg and GI consulted, dx with Ogilvie's syndrome --neostigmine  2 mg push morning of 3/29 with Dr. Auston Left and Dr. Corky Diener present with passing of gas and BM afterwards, however, colonic distention unchanged, so pt underwent endoscopic decompression on 3/30.  Unfortunately colonic gas re-accumulated. --started TPN on 3/31 Robotic assisted laparoscopic loop ileostomy with decompression of cecum on 03/20/2024 --Was clinically improving then had deterioration on 4/7.  He was made n.p.o. and was subsequently transitioned to clear liquid diet. He had abdominal pain and vomiting on 03/27/2024 so NGT was replaced. NG tube successfully removed on 03/31/2024 He had recurrent nausea and vomiting on 04/01/2024.  Abdominal x-ray showed persistent gaseous distention so NG tube was reinserted on 04/01/2024. Abdominal x-ray 04/02/2024 showed persistent small bowel dilatation suggesting obstruction.  CT abdomen pelvis ordered for further evaluation. Started on TPN for nutrition on 03/27/2024.    4/17 -- NG tube clamp trial, Q4H check residuals  --Diet per surgery, currently NPO --Off IV fluids --Advanced to full liquid diet yesterday 4/19 --Diet per surgery --Continue TPN --Continue to progress ambulation --Follow-up with general surgery --Ostomy care & education/training   Bradycardia, recurrent Initially noted secondary to  neostigmine  as administration.  Heart rate was down to 20s.  Patient received  atropine .  Heart rate improved.  On 4/3 noted to be bradycardic, asymptomatic with heart rate in the 40s to 50s.  Beta-blockade was held. Use IV metoprolol  as needed    Severe sepsis with acute organ dysfunction (HCC) 2/2 PNA Elevated lactic acid of 2.6, increased respiration rate, with fever of 102 F at home, organ involvement are cardiac and pulmonary with hypoxic respiratory failure at 88% on room air, elevated high sensitive troponin of 98 and elevated BNP --Imaging concerning for right lower lobe pneumonia, although not much upper respiratory symptoms.  Procalcitonin at 0.352. Respiratory panels were negative. UA was not consistent with UTI Patient received broad-spectrum antibiotics Completed antibiotic course    Acute hypoxic respiratory failure (HCC) Resolved.  He is tolerating room air    Elevated troponin 2/2 demand ischemia First high sensitivity troponin was elevated at 98 >>101>175>226,  Suspect secondary to demand ischemia in setting of severe sepsis with acute hypoxic respiratory failure Echocardiogram with EF of 40% and regional wall motion abnormalities, discussed with cardiology and they think these abnormalities are chronic. Patient was also experiencing intermittent chest pain over the past couple of months which seems more exertional --cardio consulted, no plan for cardiac workup    CAD (coronary artery disease) With history of anterior STEMI in 2006, transferred to [facility] status post cardiac catheterization with PCI to mid LAD using Cypher stent; currently no longer on Plavix Continue aspirin  and statin    COPD (chronic obstructive pulmonary disease) (HCC) Bronchodilators as needed   Elevated serum creatinine Creatinine is stable.  Does not meet acute kidney injury criteria, suspect prerenal in setting of nausea vomiting Serum creatinine baseline is 1.35-1.36/eGFR 52-55, consistent with CKD stage III A Monitor BMP.  Minimize use of nephrotoxins  Insomnia -  suspect hospital environment, vs chronic --melatonin increase to 10 mg (no help with 5 mg) --change ambien  to benadryl  PRN     Thyroid  nodule greater than or equal to 1.5 cm in diameter incidentally noted on imaging study Incidental finding of a 2.7 cm thyroid  nodule. -Thyroid  ultrasound with solitary nodule.  Outpatient biopsy recommended --TSH wnl    Rash Developed morning of 3/28, all over chest, back and bilateral thighs.  Appeared to be allergic rxn.  Rash improved with prednisone .  Completed 5 days of prednisone  40 mg daily.   Type II DM, hyperglycemia:  NovoLog  as needed for hyperglycemia.  Hemoglobin A1c 6.5    Comorbidities include hypertension, hyperlipidemia, osteoarthritis of both knees s/p bilateral knee arthroplasty with mild right knee effusion, gout    Diet Order             DIET SOFT Fluid consistency: Thin  Diet effective now                    Consultants: General surgery Gastroenterology Cardiology  Procedures: Flexible sigmoidoscopy on 03/17/2024 Robotic assisted laparoscopic loop ileostomy with decompression of cecum on 03/20/2024    Medications:    allopurinol   300 mg Oral Daily   amLODipine   5 mg Oral Daily   aspirin   81 mg Oral Daily   Chlorhexidine  Gluconate Cloth  6 each Topical Daily   diclofenac  Sodium  2 g Topical TID   enoxaparin  (LOVENOX ) injection  40 mg Subcutaneous Q24H   guaiFENesin   600 mg Oral BID   insulin  aspart  0-9 Units Subcutaneous Q8H   melatonin  10 mg Oral QHS  mometasone -formoterol   2 puff Inhalation BID   multivitamin with minerals  1 tablet Oral Daily   [START ON 04/09/2024] pantoprazole   40 mg Oral Daily   sodium chloride  flush  10-40 mL Intracatheter Q12H   Continuous Infusions:  promethazine  (PHENERGAN ) injection (IM or IVPB) Stopped (04/04/24 0028)   TPN ADULT (ION) 80 mL/hr at 04/07/24 1812   TPN ADULT (ION)       Anti-infectives (From admission, onward)    Start     Dose/Rate Route Frequency  Ordered Stop   03/20/24 1315  cefoTEtan  (CEFOTAN ) 2 g in sodium chloride  0.9 % 100 mL IVPB  Status:  Discontinued        2 g 200 mL/hr over 30 Minutes Intravenous  Once 03/20/24 1228 03/24/24 0924   03/20/24 0830  cefoTEtan  (CEFOTAN ) 2 g in sodium chloride  0.9 % 100 mL IVPB        2 g 200 mL/hr over 30 Minutes Intravenous  Once 03/19/24 1739 03/20/24 0825   03/13/24 1200  levofloxacin  (LEVAQUIN ) tablet 750 mg        750 mg Oral Daily 03/13/24 1148 03/15/24 1413   03/10/24 1600  vancomycin  (VANCOREADY) IVPB 1250 mg/250 mL  Status:  Discontinued        1,250 mg 166.7 mL/hr over 90 Minutes Intravenous Every 24 hours 03/10/24 1056 03/10/24 1250   03/10/24 0945  azithromycin  (ZITHROMAX ) 500 mg in sodium chloride  0.9 % 250 mL IVPB        500 mg 250 mL/hr over 60 Minutes Intravenous Every 24 hours 03/10/24 0934 03/12/24 1024   03/10/24 0200  metroNIDAZOLE  (FLAGYL ) IVPB 500 mg  Status:  Discontinued        500 mg 100 mL/hr over 60 Minutes Intravenous Every 12 hours 03/09/24 1515 03/10/24 0933   03/10/24 0000  ceFEPIme  (MAXIPIME ) 2 g in sodium chloride  0.9 % 100 mL IVPB  Status:  Discontinued        2 g 200 mL/hr over 30 Minutes Intravenous Every 12 hours 03/09/24 1541 03/13/24 1148   03/09/24 1600  vancomycin  (VANCOCIN ) IVPB 1000 mg/200 mL premix        1,000 mg 200 mL/hr over 60 Minutes Intravenous  Once 03/09/24 1537 03/09/24 1834   03/09/24 1537  vancomycin  variable dose per unstable renal function (pharmacist dosing)  Status:  Discontinued         Does not apply See admin instructions 03/09/24 1537 03/10/24 1056   03/09/24 1515  metroNIDAZOLE  (FLAGYL ) IVPB 500 mg  Status:  Discontinued        500 mg 100 mL/hr over 60 Minutes Intravenous Every 12 hours 03/09/24 1504 03/09/24 1515   03/09/24 1200  ceFEPIme  (MAXIPIME ) 2 g in sodium chloride  0.9 % 100 mL IVPB        2 g 200 mL/hr over 30 Minutes Intravenous  Once 03/09/24 1152 03/09/24 1304   03/09/24 1200  metroNIDAZOLE  (FLAGYL ) IVPB 500 mg         500 mg 100 mL/hr over 60 Minutes Intravenous  Once 03/09/24 1152 03/09/24 1505   03/09/24 1200  vancomycin  (VANCOCIN ) IVPB 1000 mg/200 mL premix        1,000 mg 200 mL/hr over 60 Minutes Intravenous  Once 03/09/24 1152 03/09/24 1523          Family Communication/Anticipated D/C date and plan/Code Status   DVT prophylaxis: enoxaparin  (LOVENOX ) injection 40 mg Start: 03/26/24 2200 Place TED hose Start: 03/09/24 1502     Code Status: Full Code  Family Communication: Plan discussed with Lamond Pilot, wife, at the bedside.  All her questions were answered.  Disposition Plan: Home with HH   Status is: Inpatient Remains inpatient appropriate because: d/c pending clearance by general surgery and further advancement of diet.    Subjective:   Pt up in recliner, wife at bedside. He ambulated around unit earlier.  Surgery tells him he can hopefully go home tomorrow.  Pt plans to go home with [facility].   Objective:    Vitals:   04/07/24 0452 04/07/24 0913 04/07/24 1959 04/08/24 0410  BP:  112/61 121/65 125/62  Pulse:  67 64 65  Resp:  16 18 18   Temp:  (!) 96.9 F (36.1 C) 97.8 F (36.6 C) 97.7 F (36.5 C)  TempSrc:  Axillary Oral Oral  SpO2:  97%  96%  Weight: 77.7 kg     Height:       No data found.   Intake/Output Summary (Last 24 hours) at 04/08/2024 1208 Last data filed at 04/08/2024 1140 Gross per 24 hour  Intake 994.64 ml  Output 1950 ml  Net -955.36 ml   Filed Weights   04/05/24 0500 04/06/24 0455 04/07/24 0452  Weight: 80.2 kg 81.5 kg 77.7 kg    Exam:    General exam: awake, alert, no acute distress HEENT: moist mucus membranes, hearing grossly normal Respiratory system: on room air normal respiratory effort. Cardiovascular system: RRR, no pedal edema.   Gastrointestinal system: ostomy bag present, soft and non-distended Central nervous system: A&O x 3. no gross focal neurologic deficits, normal speech Extremities: moves all, no edema, normal  tone Psychiatry: normal mood, congruent affect, judgement and insight appear normal     Data Reviewed:   I have personally reviewed following labs and imaging studies:  Labs: Labs show the following:   Basic Metabolic Panel: Recent Labs  Lab 04/02/24 0503 04/04/24 0930 04/08/24 0446  NA 137 134* 133*  K 4.6 4.5 4.5  CL 109 105 103  CO2 23 23 22   GLUCOSE 120* 124* 121*  BUN 31* 26* 29*  CREATININE 0.89 0.90 0.78  CALCIUM  8.7* 8.6* 8.9  MG  --  2.0 1.9  PHOS  --  3.5 4.0   GFR Estimated Creatinine Clearance: 70 mL/min (by C-G formula based on SCr of 0.78 mg/dL). Liver Function Tests: Recent Labs  Lab 04/04/24 0930 04/08/24 0446  AST 15 19  ALT 21 28  ALKPHOS 63 89  BILITOT 0.4 0.6  PROT 6.0* 6.5  ALBUMIN  2.6* 2.9*   No results for input(s): "LIPASE", "AMYLASE" in the last 168 hours. No results for input(s): "AMMONIA" in the last 168 hours. Coagulation profile No results for input(s): "INR", "PROTIME" in the last 168 hours.  CBC: Recent Labs  Lab 04/02/24 0503  WBC 10.3  HGB 11.9*  HCT 37.3*  MCV 102.2*  PLT 202   Cardiac Enzymes: No results for input(s): "CKTOTAL", "CKMB", "CKMBINDEX", "TROPONINI" in the last 168 hours. BNP (last 3 results) No results for input(s): "PROBNP" in the last 8760 hours. CBG: Recent Labs  Lab 04/07/24 0508 04/07/24 1242 04/07/24 1638 04/07/24 2309 04/08/24 0512  GLUCAP 148* 142* 100* 123* 91   D-Dimer: No results for input(s): "DDIMER" in the last 72 hours. Hgb A1c: No results for input(s): "HGBA1C" in the last 72 hours.  Lipid Profile: Recent Labs    04/08/24 0446  TRIG 146     Thyroid  function studies: No results for input(s): "TSH", "T4TOTAL", "T3FREE", "THYROIDAB" in the last 72  hours.  Invalid input(s): "FREET3" Anemia work up: No results for input(s): "VITAMINB12", "FOLATE", "FERRITIN", "TIBC", "IRON", "RETICCTPCT" in the last 72 hours. Sepsis Labs: Recent Labs  Lab 04/02/24 0503  WBC 10.3     Microbiology No results found for this or any previous visit (from the past 240 hours).  Procedures and diagnostic studies:  No results found.               LOS: 30 days   Montey Apa  [facility]   Pager on [email or web address]. If 7PM-7AM, please contact night-coverage at [email or web address]     04/08/2024, 12:08 PM

## 2024-04-09 DIAGNOSIS — R6520 Severe sepsis without septic shock: Secondary | ICD-10-CM | POA: Diagnosis not present

## 2024-04-09 DIAGNOSIS — A419 Sepsis, unspecified organism: Secondary | ICD-10-CM | POA: Diagnosis not present

## 2024-04-09 MED ORDER — ONDANSETRON 4 MG PO TBDP: 4.0000 mg | ORAL_TABLET | Freq: Three times a day (TID) | ORAL | 0 refills | Status: AC | PRN

## 2024-04-09 MED ORDER — PANTOPRAZOLE SODIUM 40 MG PO TBEC: 40.0000 mg | DELAYED_RELEASE_TABLET | Freq: Every day | ORAL | 1 refills | Status: DC

## 2024-04-09 MED ORDER — DICLOFENAC SODIUM 1 % EX GEL: 2.0000 g | Freq: Three times a day (TID) | CUTANEOUS | 1 refills | Status: DC

## 2024-04-09 MED ORDER — MELATONIN 10 MG PO TABS
5.0000 mg | ORAL_TABLET | Freq: Every day | ORAL | Status: DC
Start: 2024-04-09 — End: 2024-07-03

## 2024-04-09 MED ORDER — BACLOFEN 10 MG PO TABS: 10.0000 mg | ORAL_TABLET | Freq: Three times a day (TID) | ORAL | Status: AC | PRN

## 2024-04-09 MED ORDER — BUDESONIDE-FORMOTEROL FUMARATE 160-4.5 MCG/ACT IN AERO: 2.0000 | INHALATION_SPRAY | Freq: Two times a day (BID) | RESPIRATORY_TRACT | 1 refills | Status: AC

## 2024-04-09 MED ORDER — ALBUTEROL SULFATE HFA 108 (90 BASE) MCG/ACT IN AERS: 1.0000 | INHALATION_SPRAY | Freq: Four times a day (QID) | RESPIRATORY_TRACT | 1 refills | Status: AC | PRN

## 2024-04-09 MED ORDER — ADULT MULTIVITAMIN W/MINERALS CH: 1.0000 | ORAL_TABLET | Freq: Every day | ORAL | Status: DC

## 2024-04-09 MED ORDER — AMLODIPINE BESYLATE 5 MG PO TABS: 5.0000 mg | ORAL_TABLET | Freq: Every day | ORAL | 1 refills | Status: DC

## 2024-04-09 NOTE — Progress Notes (Signed)
 Mobility Specialist - Progress Note   04/09/24 1155  Mobility  Activity Ambulated with assistance in hallway  Level of Assistance Standby assist, set-up cues, supervision of patient - no hands on  Assistive Device Front wheel walker  Distance Ambulated (ft) 320 ft  Activity Response Tolerated well  Mobility visit 1 Mobility  Mobility Specialist Start Time (ACUTE ONLY) 1140  Mobility Specialist Stop Time (ACUTE ONLY) 1153  Mobility Specialist Time Calculation (min) (ACUTE ONLY) 13 min   Pt supine upon entry, utilizing RA. Pt motivated and agreeable to OOB amb this date. Pt completed bed mob ModI, STS to RW and amb two laps around the NS with close supervision-- tolerated well, denied pain. Pt returned to the room, left seated in the recliner with needs within reach. Family member present at bedside.  Versa Gore Mobility Specialist 04/09/24 11:58 AM

## 2024-04-09 NOTE — TOC Transition Note (Addendum)
 Transition of Care [facility]) - Discharge Note   Patient Details  Name: Todd Rojas MRN: [id number] Date of Birth: 02-10-1941  Transition of [facility] CM/SW Contact:  Loman Risk, RN Phone Number: 04/09/2024, 11:04 AM   Clinical Narrative:     Patient to discharge today Therapy has upgraded recs to home health Patient in agreement, he has discussed with his spouse and they do not have a preference of home health agency.  Referral made to [place]  with [facility]  Therapy recommends RW and BSC.  Patient confirms he already has a RW and declines BSC Wife to transport at discharge    Barriers to Discharge: Continued Medical Work up   Patient Goals and CMS Choice            Discharge Placement                       Discharge Plan and Services Additional resources added to the After Visit Summary for       Post Acute Care Choice:  (TBD)                               Social Drivers of Health (SDOH) Interventions SDOH Screenings   Food Insecurity: No Food Insecurity (03/10/2024)  Housing: Low Risk  (03/10/2024)  Transportation Needs: No Transportation Needs (03/10/2024)  Utilities: Not At Risk (03/10/2024)  Depression (PHQ2-9): Low Risk  (07/12/2021)  Financial Resource Strain: Low Risk  (10/23/2023)   Received from [facility]  Social Connections: Socially Integrated (03/10/2024)  Tobacco Use: Medium Risk (03/25/2024)     Readmission Risk Interventions     No data to display

## 2024-04-09 NOTE — Discharge Summary (Signed)
 Physician Discharge Summary   Patient: Todd Rojas MRN: [id number] DOB: 07/27/1941  Admit date:     03/09/2024  Discharge date: 04/09/2024  Discharge Physician: Montey Apa   PCP: Antonio Baumgarten, MD   Recommendations at discharge:    Follow up with Surgery as scheduled Follow up with Primary Care in 1-2 weeks Repeat CBC, BMP at follow up  Discharge Diagnoses: Principal Problem:   Severe sepsis with acute organ dysfunction (HCC) Active Problems:   Fever   Acute hypoxic respiratory failure (HCC)   CAD (coronary artery disease)   Elevated troponin   Gaseous abdominal distention   COPD (chronic obstructive pulmonary disease) (HCC)   Elevated serum creatinine   Hypertension   Hyperlipidemia, unspecified   Primary osteoarthritis of both knees   Thyroid  nodule greater than or equal to 1.5 cm in diameter incidentally noted on imaging study   Dyslipidemia   Personal history of gout   Ogilvie syndrome   Protein-calorie malnutrition, severe  Resolved Problems:   History of tobacco abuse  Hospital Course:  Medical records reviewed and are as summarized below:   "Braddock Servellon is a 83 y.o. male  with history of CKD stage IIIa, chronic gout, hypertension, hyperlipidemia, interstitial pulmonary disease, bronchiectasis, COPD, who presents emergency department for chief concerns of fever, chills, shortness of breath for 2 weeks.   Vitals in the ED showed temperature of 98.4, respiration rate 25, heart rate 94, blood pressure initially 96/69 and improved to 107/66, SpO2 initially desatted to 88% on room air   Status post exploratory laparotomy with robotic assisted laparoscopic loop ileostomy and decompression of cecum on 4/2.  Tolerated procedure well.  No ischemic gut noted   4/7: Worsening nausea.  Inability to tolerate p.o.  Repeat KUB demonstrating ileus.  General surgery reengaged.  Ordering CT abdomen.   4/8: Conservative management per surgery.  No  evidence of obstruction.  CT reassuring.  Diet scaled back to n.p.o. for now.  Clear liquids later today if stable."     Further hospital course and management as outlined below.   I assumed care on 04/03/24. NG tube in place for persistent ileus.  Surgery continues to follow closely.  4/16---4/22 - patient continued to gradually improve.  NGT was kept in place while diet slowly advanced.  Pt now tolerating soft low fiber diet  4/22 -- pt cleared by surgery and is medically stable for discharge    Assessment and Plan:  Gaseous abdominal distention 2/2 Colonic ileus  Ogilvie's syndrome Patient developed abdominal distention and crampy abdominal pain after presentation. Starting 3/25, Imaging with right colonic gaseous distention without any obvious obstruction.  Colonic distention has been worsening. --GenSurg and GI consulted, dx with Ogilvie's syndrome --neostigmine  2 mg push morning of 3/29 with Dr. Auston Left and Dr. Corky Diener present with passing of gas and BM afterwards, however, colonic distention unchanged, so pt underwent endoscopic decompression on 3/30.  Unfortunately colonic gas re-accumulated. --started TPN on 3/31 Robotic assisted laparoscopic loop ileostomy with decompression of cecum on 03/20/2024 --Was clinically improving then had deterioration on 4/7.  He was made n.p.o. and was subsequently transitioned to clear liquid diet. He had abdominal pain and vomiting on 03/27/2024 so NGT was replaced. NG tube successfully removed on 03/31/2024 He had recurrent nausea and vomiting on 04/01/2024.   Abdominal x-ray showed persistent gaseous distention so NG tube was reinserted on 04/01/2024. Abdominal x-ray 04/02/2024 showed persistent small bowel dilatation suggesting obstruction.  CT abdomen pelvis ordered for further evaluation.  Started on TPN for nutrition on 03/27/2024.     4/17 -- NG tube clamp trial, Q4H check residuals  --Diet per surgery, currently NPO --Off IV fluids --Advanced to  full liquid diet yesterday 4/19 --Diet per surgery --Stopping TPN --Continue to progress ambulation --Follow-up with general surgery --Ostomy care & education/training  4/22 -- patient cleared by surgery for discharge     Bradycardia, recurrent Initially noted secondary to neostigmine  as administration.  Heart rate was down to 20s.  Patient received atropine .  Heart rate improved.  On 4/3 noted to be bradycardic, asymptomatic with heart rate in the 40s to 50s.  Beta-blocker was held. -- IV metoprolol  as needed     Severe sepsis with acute organ dysfunction (HCC) 2/2 PNA Elevated lactic acid of 2.6, increased respiration rate, with fever of 102 F at home, organ involvement are cardiac and pulmonary with hypoxic respiratory failure at 88% on room air, elevated high sensitive troponin of 98 and elevated BNP --Imaging concerning for right lower lobe pneumonia, although not much upper respiratory symptoms.  Procalcitonin at 0.352. Respiratory panels were negative. UA was not consistent with UTI Patient received broad-spectrum antibiotics Completed antibiotic course     Acute hypoxic respiratory failure (HCC) Resolved.  He is tolerating room air     Elevated troponin 2/2 demand ischemia First high sensitivity troponin was elevated at 98 >>101>175>226,  Suspect secondary to demand ischemia in setting of severe sepsis with acute hypoxic respiratory failure Echocardiogram with EF of 40% and regional wall motion abnormalities, discussed with cardiology and they think these abnormalities are chronic. Patient was also experiencing intermittent chest pain over the past couple of months which seems more exertional --cardio consulted, no plan for cardiac workup     CAD (coronary artery disease) With history of anterior STEMI in 2006, transferred to [facility] status post cardiac catheterization with PCI to mid LAD using Cypher stent; currently no longer on Plavix Continue aspirin  and statin      COPD (chronic obstructive pulmonary disease) (HCC) Bronchodilators as needed     Elevated serum creatinine Creatinine is stable.  Does not meet acute kidney injury criteria, suspect prerenal in setting of nausea vomiting Serum creatinine baseline is 1.35-1.36/eGFR 52-55, consistent with CKD stage III A Monitor BMP.  Minimize use of nephrotoxins   Insomnia - suspect hospital environment, vs chronic --melatonin increase to 10 mg (no help with 5 mg) --change ambien  to benadryl  PRN     Thyroid  nodule greater than or equal to 1.5 cm in diameter incidentally noted on imaging study Incidental finding of a 2.7 cm thyroid  nodule. -Thyroid  ultrasound with solitary nodule.  Outpatient biopsy recommended --TSH wnl     Rash Developed morning of 3/28, all over chest, back and bilateral thighs.  Appeared to be allergic rxn.  Rash improved with prednisone .  Completed 5 days of prednisone  40 mg daily.     Type II DM, hyperglycemia:  NovoLog  as needed for hyperglycemia.  Hemoglobin A1c 6.5     Comorbidities include hypertension, hyperlipidemia, osteoarthritis of both knees s/p bilateral knee arthroplasty with mild right knee effusion, gout       Consultants: surgery Procedures performed: as above  Disposition: Home health Diet recommendation:  Soft / low fiber  DISCHARGE MEDICATION: Allergies as of 04/09/2024   No Known Allergies      Medication List     PAUSE taking these medications    amLODipine  5 MG tablet Wait to take this until your doctor or other care provider  tells you to start again. Commonly known as: NORVASC  Take 1 tablet (5 mg total) by mouth daily.   furosemide  20 MG tablet Wait to take this until your doctor or other care provider tells you to start again. Commonly known as: LASIX  Take 20 mg by mouth daily.   lisinopril  20 MG tablet Wait to take this until your doctor or other care provider tells you to start again. Commonly known as: ZESTRIL  Take 20 mg  by mouth daily.       STOP taking these medications    gabapentin 300 MG capsule Commonly known as: NEURONTIN   levofloxacin  500 MG tablet Commonly known as: LEVAQUIN    UNABLE TO FIND       TAKE these medications    acetaminophen  500 MG tablet Commonly known as: TYLENOL  Take 1,000 mg by mouth every 8 (eight) hours as needed for mild pain.   albuterol  108 (90 Base) MCG/ACT inhaler Commonly known as: VENTOLIN  HFA Inhale 1 puff into the lungs every 6 (six) hours as needed for wheezing or shortness of breath.   allopurinol  300 MG tablet Commonly known as: ZYLOPRIM  Take 300 mg by mouth daily.   aspirin  EC 81 MG tablet Take 81 mg by mouth daily.   baclofen  10 MG tablet Commonly known as: LIORESAL  Take 1 tablet (10 mg total) by mouth 3 (three) times daily as needed for muscle spasms.   budesonide -formoterol  160-4.5 MCG/ACT inhaler Commonly known as: SYMBICORT  Inhale 2 puffs into the lungs 2 (two) times daily. What changed:  how much to take when to take this   colchicine 0.6 MG tablet Take 0.6 mg by mouth daily.   cyanocobalamin  1000 MCG tablet Commonly known as: VITAMIN B12 Take 1,000 mcg by mouth daily.   diclofenac  Sodium 1 % Gel Commonly known as: VOLTAREN  Apply 2 g topically 3 (three) times daily.   Melatonin 10 MG Tabs Take 5-10 mg by mouth at bedtime.   multivitamin with minerals Tabs tablet Take 1 tablet by mouth daily.   nitroGLYCERIN  0.4 MG SL tablet Commonly known as: NITROSTAT  Place under the tongue.   ondansetron  4 MG disintegrating tablet Commonly known as: ZOFRAN -ODT Take 1 tablet (4 mg total) by mouth every 8 (eight) hours as needed for nausea or vomiting.   pantoprazole  40 MG tablet Commonly known as: PROTONIX  Take 1 tablet (40 mg total) by mouth daily.   rosuvastatin  20 MG tablet Commonly known as: CRESTOR  Take 20 mg by mouth daily.   VanishPoint Tuberculin Syringe 25G X 1" 1 ML Misc Generic drug: Tuberculin-Allergy Syringes         Contact information for follow-up providers     Pabon, [place] F, MD. Schedule an appointment as soon as possible for a visit on 04/22/2024.   Specialty: General Surgery Why: s/p ileostomy. Go @ 11:15am. Contact information: [address] [place] [address] [phone]              Contact information for after-discharge care     Destination     [facility] Preferred SNF .   Service: Skilled Nursing Contact information: [address] [place]  [address] [phone]                    Discharge Exam: Filed Weights   04/06/24 0455 04/07/24 0452 04/09/24 0500  Weight: 81.5 kg 77.7 kg 78.5 kg   General exam: awake, alert, no acute distress HEENT: atraumatic, clear conjunctiva, anicteric sclera,moist mucus membranes,  hearing grossly normal  Respiratory system: CTAB, no wheezes, rales or rhonchi, normal respiratory effort. Cardiovascular system: normal S1/S2, RRR, no JVD, murmurs, rubs, gallops, no pedal edema.   Gastrointestinal system: soft, NT, ND, no HSM felt, +bowel sounds. Central nervous system: A&O x 3. no gross focal neurologic deficits, normal speech Extremities: moves all, no edema, normal tone Skin: dry, intact, normal temperature, normal color,No rashes, lesions or ulcers Psychiatry: normal mood, congruent affect, judgement and insight appear normal   Condition at discharge: stable  The results of significant diagnostics from this hospitalization (including imaging, microbiology, ancillary and laboratory) are listed below for reference.   Imaging Studies: CT CHEST W CONTRAST Addendum Date: 04/16/2024 ADDENDUM REPORT: 04/16/2024 18:27 ADDENDUM: Critical Value/emergent results were called by telephone at the time of interpretation on 04/16/2024 at 6:25 pm to provider [facility] , who verbally acknowledged these results. Electronically Signed   By: Beula Brunswick M.D.   On:  04/16/2024 18:27   Result Date: 04/16/2024 CLINICAL DATA:  Chest pain, nonspecific. EXAM: CT CHEST WITH CONTRAST TECHNIQUE: Multidetector CT imaging of the chest was performed during intravenous contrast administration. RADIATION DOSE REDUCTION: This exam was performed according to the departmental dose-optimization program which includes automated exposure control, adjustment of the mA and/or kV according to patient size and/or use of iterative reconstruction technique. CONTRAST:  75mL OMNIPAQUE  IOHEXOL  300 MG/ML  SOLN COMPARISON:  CT scan chest from 03/10/2024. FINDINGS: Cardiovascular: Normal cardiac size. No pericardial effusion. No aortic aneurysm. There are coronary artery calcifications, in keeping with coronary artery disease. There are also mild peripheral atherosclerotic vascular calcifications of thoracic aorta and its major branches. Please note, this examination was not ordered/performed as per the pulmonary embolism protocol; however, there is satisfactory opacification of pulmonary arteries. There is small volume occlusive embolism noted in the left lung lower lobe segmental pulmonary artery branch (series 2, images 90-92). There is no right heart strain or lung infarction. There is dilation of the main pulmonary trunk measuring up to 3.0 cm, which is nonspecific but can be seen with pulmonary artery hypertension. Mediastinum/Nodes: There is a 1.0 x 2.3 cm hypoattenuating nodule in the left thyroid  lobe, incompletely characterized on the current exam but grossly unchanged since the prior study. Please refer to ultrasound from 03/10/2024 for details. No solid / cystic mediastinal masses. The esophagus is nondistended precluding optimal assessment. There are few mildly prominent mediastinal and hilar lymph nodes, which do not meet the size criteria for lymphadenopathy and appear grossly similar to the prior study, favoring benign etiology. No axillary lymphadenopathy by size criteria. Lungs/Pleura:  The central tracheo-bronchial tree is patent. There is partial collapse of bilateral lower lobes. There is loculated small left and trace right pleural effusion. No mass, consolidation or pneumothorax. No suspicious lung nodules. Upper Abdomen: Small volume dependent gallstones/sludge noted without imaging signs of acute cholecystitis. There is a small sliding hiatal hernia. Remaining visualized upper abdominal viscera within normal limits. Musculoskeletal: The visualized soft tissues of the chest wall are grossly unremarkable. No suspicious osseous lesions. There are mild multilevel degenerative changes in the visualized spine. IMPRESSION: 1. There is small volume occlusive embolism in the left lung lower lobe segmental pulmonary artery branch. No right heart strain or lung infarction. 2. There is loculated small left and trace right pleural effusion. 3. Multiple other nonacute observations, as described above. Aortic Atherosclerosis (ICD10-I70.0). Electronically Signed: By: Beula Brunswick M.D. On: 04/16/2024 17:55   CT ABDOMEN PELVIS W CONTRAST Result Date: 04/12/2024 CLINICAL DATA:  Recent abdominal surgery presenting with fever, leukocytosis and vomiting. EXAM: CT ABDOMEN AND PELVIS WITH CONTRAST TECHNIQUE: Multidetector CT imaging of the abdomen and pelvis was performed using the standard protocol following bolus administration of intravenous contrast. RADIATION DOSE REDUCTION: This exam was performed according to the departmental dose-optimization program which includes automated exposure control, adjustment of the mA and/or kV according to patient size and/or use of iterative reconstruction technique. CONTRAST:  OMNIPAQUE  IOHEXOL  300 MG/ML  SOLN COMPARISON:  April 02, 2024 FINDINGS: Lower chest: Stable moderate to marked severity areas of atelectasis and/or infiltrate are seen within the posterior aspects of the bilateral lung bases. Hepatobiliary: A stable 3 mm focus of parenchymal low attenuation  is seen within the anterior aspect of the left lobe of the liver (axial CT image 25, CT series 2). No gallstones, gallbladder wall thickening, or biliary dilatation. Pancreas: Unremarkable. No pancreatic ductal dilatation or surrounding inflammatory changes. Spleen: Normal in size without focal abnormality. Adrenals/Urinary Tract: Adrenal glands are unremarkable. Kidneys are normal in size, without renal calculi or hydronephrosis. A stable simple cyst is seen within the right kidney. Areas of focal cortical scarring are noted within the anterolateral aspect of the left kidney. There is moderate severity dilatation of a short segment of the distal left ureter which extends to the level of the left UVJ. Bladder is unremarkable. Stomach/Bowel: The enteric tube seen on the prior study has been removed. There is a very small hiatal hernia. Appendix appears normal. No evidence of bowel wall thickening, distention, or inflammatory changes. Noninflamed diverticula are seen scattered throughout the sigmoid colon. Vascular/Lymphatic: Aortic atherosclerosis. No enlarged abdominal or pelvic lymph nodes. Reproductive: There is moderate to marked severity prostate gland enlargement. A stable 2.3 cm x 1.8 cm x 2.8 cm area of increased attenuation (approximately 124.31 Hounsfield units) is seen within the posterolateral aspect of the prostate gland on the right (axial CT image 95, CT series 2) Other: A right pelvic ostomy site is again seen. Small, stable bilateral fat containing inguinal hernias are also noted. No abdominopelvic ascites. Musculoskeletal: Multilevel degenerative changes are seen throughout the lumbar spine. IMPRESSION: 1. Stable moderate to marked severity posterior bibasilar atelectasis and/or infiltrate. 2. 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 3. Prostatomegaly with an area of increased parenchymal attenuation which may represent abnormal parenchymal enhancement. Correlation with  PSA levels is recommended. Electronically Signed   By: Virgle Grime M.D.   On: 04/12/2024 01:43   DG Chest 2 View Result Date: 04/12/2024 CLINICAL DATA:  Possible sepsis EXAM: CHEST - 2 VIEW COMPARISON:  04/05/2024 FINDINGS: Elevation of the right diaphragm with atelectasis or scar at the right base. No focal consolidation, pleural effusion or pneumothorax. Stable cardiomediastinal silhouette. IMPRESSION: No active cardiopulmonary disease. Elevation of the right diaphragm with atelectasis or scar at the right base. Electronically Signed   By: Esmeralda Hedge M.D.   On: 04/12/2024 00:20   DG ABD ACUTE 2+V W 1V CHEST Result Date: 04/05/2024 CLINICAL DATA:  Ileus EXAM: DG ABDOMEN ACUTE WITH 1 VIEW CHEST COMPARISON:  Chest and abdomen radiograph dated 04/03/2024 FINDINGS: Lines/tubes: Gastric/enteric tube tip projects over the stomach with side hole projecting over the gastric cardia. Right upper extremity PICC tip terminates at the superior cavoatrial junction. Chest: Unchanged elevation of the right hemidiaphragm. Linear bibasilar opacities, likely atelectasis. No pneumothorax or pleural effusion. Normal heart size. Abdomen: A few dilated loops of small bowel are present within the left hemiabdomen. Decreased conspicuity of the previously noted dilated contrast material.  No pneumatosis or free air. No abnormal calcification or mass effect. Bones: No acute osseous abnormality. IMPRESSION: 1. A few dilated loops of small bowel are present within the left hemiabdomen, likely ileus. Partial small bowel obstruction could have a similar appearance. Decreased conspicuity of the previously noted dilated contrast material suggesting interval passage. 2. Gastric/enteric tube tip projects over the stomach with side hole projecting over the gastric cardia. Consider advancing by 5 cm for more optimal positioning. Electronically Signed   By: Limin  Xu M.D.   On: 04/05/2024 10:44   DG ABD ACUTE 2+V W 1V CHEST Result  Date: 04/03/2024 CLINICAL DATA:  Ileus after gastrointestinal surgery EXAM: DG ABDOMEN ACUTE WITH 1 VIEW CHEST COMPARISON:  Yesterday FINDINGS: Low volume chest with elevated right diaphragm and overlying atelectasis or scar. Contrast is seen within the bladder and within bowel loops. Bowel distension has improved. An enteric tube tip and side-port reaches the collapsed stomach. Right PICC with tip at the SVC. IMPRESSION: Improving bowel distension, although still abnormal with dilute contrast seen from CT yesterday. Electronically Signed   By: Ronnette Coke M.D.   On: 04/03/2024 11:10   CT ABDOMEN PELVIS W CONTRAST Result Date: 04/02/2024 CLINICAL DATA:  Bowel obstruction. Prior laparoscopic loop ileostomy related Ogilvie's disease. EXAM: CT ABDOMEN AND PELVIS WITH CONTRAST TECHNIQUE: Multidetector CT imaging of the abdomen and pelvis was performed using the standard protocol following bolus administration of intravenous contrast. RADIATION DOSE REDUCTION: This exam was performed according to the departmental dose-optimization program which includes automated exposure control, adjustment of the mA and/or kV according to patient size and/or use of iterative reconstruction technique. CONTRAST:  100mL OMNIPAQUE  IOHEXOL  300 MG/ML  SOLN COMPARISON:  X-ray 04/02/2024 of the abdomen and older. CT 03/25/2024. FINDINGS: Lower chest: Persistent nodular bandlike opacities along the lung bases. Slightly increasing on the left. Similar on the right. Atelectasis versus infiltrate. Recommend follow-up. No pleural effusion. There is breathing motion. Coronary artery calcifications are identified. Enteric tube extends into the stomach. Slightly patulous lower esophagus. Slight elevation of the right hemidiaphragm. Hepatobiliary: Gallbladder has some slight increased density in the lumen. Sludge or small stones are in the differential. Patent portal vein. Tiny segment 3 low-attenuation liver lesion. Unchanged from previous but  too small to completely characterize. Statistically likely a benign cystic lesion. No new space-occupying liver lesion at this time. Pancreas: Moderate global atrophy of the pancreas without mass. Spleen: Normal in size without focal abnormality. Adrenals/Urinary Tract: Adrenal glands are preserved. Moderate atrophy of the left kidney. Slight right. Stable 14 mm lower pole anterior right-sided renal cyst. No enhancing renal mass. Mild nonspecific perinephric stranding. No collecting system dilatation. The right ureter has a normal course and caliber. There is ectasia of the distal left ureter, unchanged from previous. Again no delayed enhancement excretion from the left kidney. This could be a patulous for chronic process including a megaureter. Please correlate for any history of chronic reflux. Preserved contour to the urinary bladder. Stomach/Bowel: Large bowel is decompressed. Few left-sided colonic diverticula. Normal retrocecal appendix. Stomach is nondilated. Diverting right-sided ostomy at the level of the pelvis. There is of training mucous fistula along the terminal ileum which is decompressed. The small bowel is diffusely mildly dilated with air-fluid levels. There is some oral contrast administered in the stomach and majority of the small bowel but the contrast does not extend all the way to the ostomy. The dilatation has diameter approaching up to 4.1 cm. The dilatation extends to the level of the  exiting loop at the ostomy. Please see coronal series 5, image 20. Vascular/Lymphatic: Normal caliber aorta and IVC with some atherosclerotic changes. Retroaortic left renal vein. No specific abnormal lymph node enlargement identified in the abdomen and pelvis. Reproductive: Enlarged prostate. There is a heterogeneous area of higher density along the peripheral zone of the right side of the prostate. Possibilities would include abnormal enhancement. Please correlate for the patient's PSA to assess for  potential neoplasm. Other: No free intra-air.  No free fluid. Musculoskeletal: Curvature and degenerative changes along the spine. Degenerative changes along the pelvis. Focal lipoma along the left flank musculature on series 2, image 58. IMPRESSION: Diverting right-sided ileostomy. Two loops are brought out, 1 for the proximal ileum and 1 distal. The terminal ileum and colon are decompressed. There is persistent dilatation of the small bowel diffusely measuring up to 4.1 cm in diameter. Transition at the level of the ostomy. Oral contrast was administered and is seen opacifying the stomach and majority of the small bowel but the contrast has not yet reached the ostomy at the time of imaging. If needed delayed study could be obtained to see if the contrast reaches the ostomy. This could be done with an x-ray potentially. No free air or free fluid. Lung base parenchymal opacity identified slightly increasing on the left. Atelectasis favored over infiltrate but recommend follow-up. Question gallbladder sludge or stones. Heterogeneous density to the prostate particularly peripheral zone on the right side posteriorly. This could be abnormal enhancement. Please correlate for any history of known malignancy and correlate with the patient's PSA. Further workup when clinically appropriate. 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 Favor a chronic process. Electronically Signed   By: Adrianna Horde M.D.   On: 04/02/2024 17:48   DG ABD ACUTE 2+V W 1V CHEST Result Date: 04/02/2024 CLINICAL DATA:  [id number] Ileus following gastrointestinal surgery (HCC) [id number] EXAM: DG ABDOMEN ACUTE WITH 1 VIEW CHEST COMPARISON:  04/01/2024 FINDINGS: Mild elevation of right diaphragmatic leaflet as before. Right infrahilar opacities persist. Left lung clear. No definite pleural effusion. Gastric tube into the decompressed stomach. No free air. Multiple  dilated mid abdominal small bowel loops, similar in number and degree of dilatation since previous. The colon appears decompressed. There are no abnormal calcifications. Regional bones unremarkable.  Left shoulder DJD. IMPRESSION: 1. Persistent small bowel dilatation, suggesting  obstruction. 2. Gastric tube into the decompressed stomach. 3. Persistent right infrahilar opacities. Electronically Signed   By: Nicoletta Barrier M.D.   On: 04/02/2024 12:59   DG ABD ACUTE 2+V W 1V CHEST Result Date: 04/01/2024 CLINICAL DATA:  Postoperative ileus. 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 EXAM: DG ABDOMEN ACUTE WITH 1 VIEW CHEST COMPARISON:  Radiographs 03/30/2024 and 03/29/2024.  CT 03/25/2024. FINDINGS: Enteric tube has been removed. Right arm PICC projects to the superior cavoatrial junction. Persistent low lung volumes with right greater than left basilar atelectasis. No significant pleural effusion or pneumothorax. The heart size and mediastinal contours are stable. New mild gastric distension. Moderate diffuse small bowel distension has not significantly changed with multiple air-fluid levels on the erect examination. The colon appears decompressed. No evidence of pneumoperitoneum or acute osseous abnormality. IMPRESSION: 1. New mild gastric distension following enteric tube removal. 2. No significant change in moderate diffuse small bowel distension with multiple air-fluid levels, most consistent with postoperative ileus status post recent ileostomy. If concern for  bowel obstruction, consider follow-up CT with enteric contrast. 3. Persistent low lung volumes with right greater than left basilar atelectasis. Electronically Signed   By: Elmon Hagedorn M.D.   On: 04/01/2024 12:35   DG Abd 1 View Result Date: 04/01/2024 CLINICAL DATA:  NG tube placement. EXAM: ABDOMEN - 1 VIEW COMPARISON:  Earlier same day. FINDINGS: NG tube tip is in the mid stomach with proximal  side port below the GE junction. Moderate gaseous distention of the stomach noted despite NG tube placement. Gas-filled dilated small bowel loops noted in the visualized upper abdomen, measuring up to 4.2 cm diameter. There is right base collapse/consolidation with small right pleural effusion. The tip of a right PICC line overlies the mid SVC level. IMPRESSION: 1. NG tube tip is in the mid stomach. 2. Moderate gaseous distention of the stomach despite NG tube placement. 3. Gas-filled dilated small bowel loops in the visualized upper abdomen, similar to prior. Electronically Signed   By: Donnal Fusi M.D.   On: 04/01/2024 10:52   DG ABD ACUTE 2+V W 1V CHEST Result Date: 03/30/2024 CLINICAL DATA:  [id number] Ileus following gastrointestinal surgery (HCC) [id number] EXAM: DG ABDOMEN ACUTE WITH 1 VIEW CHEST COMPARISON:  the previous day's study FINDINGS: Improving bibasilar airspace opacities.  Right arm PICC stable. Gastric tube to the proximal duodenum, the stomach decompressed. There are few mildly dilated mid abdominal small bowel loops with fluid levels, decreased slightly in number and degree of dilatation since previous study. The colon remains decompressed. No free air. Mild lumbar spondylitic changes. IMPRESSION: 1. Improving bibasilar airspace disease. 2. Slightly improved small bowel dilatation. Electronically Signed   By: Nicoletta Barrier M.D.   On: 03/30/2024 08:46   DG ABD ACUTE 2+V W 1V CHEST Result Date: 03/29/2024 CLINICAL DATA:  [id number] Ileus following gastrointestinal surgery (HCC) [id number]. EXAM: DG ABDOMEN ACUTE WITH 1 VIEW CHEST COMPARISON:  03/28/2024. FINDINGS: There is gaseous dilatation of small bowel loops, which is disproportionate to the degree of distention of the colon, which may represent small bowel obstruction versus adynamic ileus. There is mild interval increase in the degree of dilation of bowel loops when compared to the recent prior exam. Small amount of stool noted in the ascending colon.  Rest of the colon is collapsed. The No evidence of pneumoperitoneum. Low lung volume. Bilateral lung fields are clear. Bilateral costophrenic angles are clear. Normal cardio-mediastinal silhouette. No acute osseous abnormalities. The soft tissues are within normal limits. Surgical changes, devices, tubes and lines: Enteric tube seen with its tip and side hole overlying the right upper quadrant, overlying the pylorus/duodenal bulb region. IMPRESSION: 1. Mild interval increase in the degree of dilation of small bowel loops, which may represent worsening small bowel obstruction versus adynamic ileus. Correlate clinically. 2. No active cardiopulmonary disease. Electronically Signed   By: Beula Brunswick M.D.   On: 03/29/2024 08:35   DG ABD ACUTE 2+V W 1V CHEST Result Date: 03/28/2024 CLINICAL DATA:  Ileus. EXAM: DG ABDOMEN ACUTE WITH 1 VIEW CHEST COMPARISON:  Radiograph dated 03/27/2024. FINDINGS: Enteric tube coiled in the proximal stomach with tip in the distal stomach. Persistent dilatation of small-bowel loops measure up to 3.6 cm. Similar positioning of right-sided PICC. Shallow inspiration with bibasilar atelectasis. No consolidative changes or pneumothorax. Stable cardiac silhouette. No acute osseous pathology. IMPRESSION: 1. Enteric tube with tip in the distal stomach. 2. Persistent small-bowel dilatation. Electronically Signed   By: Angus Bark M.D.   On: 03/28/2024 11:16   DG Abd 1  View Result Date: 03/27/2024 CLINICAL DATA:  NG placement. EXAM: ABDOMEN - 1 VIEW COMPARISON:  Radiograph dated 03/27/2024. FINDINGS: Enteric tube with tip in the region of the distal stomach. Dilated small bowel loops measure up to 3.6 cm. IMPRESSION: Enteric tube with tip in the distal stomach. Electronically Signed   By: Angus Bark M.D.   On: 03/27/2024 17:04   DG ABD ACUTE 2+V W 1V CHEST Result Date: 03/27/2024 CLINICAL DATA:  Ileus EXAM: DG ABDOMEN ACUTE WITH 1 VIEW CHEST COMPARISON:  03/21/2024 FINDINGS:  Gaseous distention of the stomach and small bowel loops. Decompressed colon. Findings concerning for small bowel obstruction. No organomegaly or free air. Mild elevation of the right hemidiaphragm. No confluent opacities or effusions. Heart mediastinal contours within normal limits. IMPRESSION: Dilated stomach and small bowel concerning for small bowel obstruction. Electronically Signed   By: Janeece Mechanic M.D.   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 EXAM: CT ABDOMEN AND PELVIS WITH CONTRAST TECHNIQUE: Multidetector CT imaging of the abdomen and pelvis was performed using the standard protocol following bolus administration of intravenous contrast. RADIATION DOSE REDUCTION: This exam was performed according to the departmental dose-optimization program which includes automated exposure control, adjustment of the mA and/or kV according to patient size and/or use of iterative reconstruction technique. CONTRAST:  OMNIPAQUE  IOHEXOL  300 MG/ML  SOLN COMPARISON:  CT 03/15/2024 FINDINGS: Lower chest: Peribronchial thickening and mild bronchiectasis lung bases. Evidence of active pneumonia. Mild basilar atelectasis on the RIGHT. Hepatobiliary: Several low-density lesions LEFT hepatic lobe too small to characterize. Subtle enhancing lesion posterior RIGHT hepatic lobe measuring 8 mm on image 20/2 cannot be fully characterize. Pancreas: Pancreas is normal. No ductal dilatation. No pancreatic inflammation. Spleen: Normal spleen Adrenals/urinary tract: Adrenal glands are normal. Cortical scarring of the LEFT kidney. Small simple fluid attenuation cyst of the RIGHT kidney measuring 14 mm on postcontrast imaging ureters and bladder normal. Stomach/Bowel: Fluid stomach is fluid-filled. Duodenum is normal. The small bowel is fluid-filled. The small bowel is mildly dilated to 3.3  cm (image 66/2). No evidence of high-grade obstruction. 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 The distal ileum and colon are collapsed. Vascular/Lymphatic: Abdominal aorta is normal caliber. No periportal or retroperitoneal adenopathy. No pelvic adenopathy. Reproductive: Enlarged prostate gland Other: No free fluid. Musculoskeletal: No aggressive osseous lesion. IMPRESSION: 1. 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 Small bowel proximal to the ostomy is mildly dilated and fluid-filled. Favor postoperative ileus over bowel obstruction. 2. The distal small bowel and colon are collapsed. 3. Moderate volume of fluid within this stomach. 4. Basilar atelectasis. 5. Several small lesion liver too small to characterize but favored benign. Electronically Signed   By: Deboraha Fallow M.D.   On: 03/25/2024 15:22   DG Abd 1 View Result Date: 03/25/2024 CLINICAL DATA:  Abdominal distension. History of Ogilvie syndrome. Status post robotic assisted diagnostic laparoscopy on 03/20/2024. EXAM: ABDOMEN - 1 VIEW COMPARISON:  03/21/24 FINDINGS: Since the previous exam there is been interval development of multiple dilated small bowel loops which measure up to 3.9 cm. The colon appears decompressed. No signs of pneumoperitoneum. IMPRESSION: Interval development of multiple dilated small bowel loops. Imaging findings are concerning for either small bowel ileus versus obstruction. Electronically Signed   By: Kimberley Penman M.D.   On: 03/25/2024 07:48   DG ABD ACUTE 2+V W 1V CHEST Result Date:  03/21/2024 CLINICAL DATA:  Ogilvie syndrome. EXAM: DG ABDOMEN ACUTE WITH 1 VIEW CHEST COMPARISON:  Abdominal radiograph dated 03/20/2024. FINDINGS: Evaluation is limited due to body habitus. Right-sided PICC in similar position. Mild eventration of the right hemidiaphragm. Small right pleural effusion and right lung base atelectasis. No pneumothorax. Stable cardiomediastinal silhouette. Mild air distention of the stomach. No bowel  dilatation or evidence of obstruction. Contrast noted throughout the colon. No free air. The osseous structures are intact. The soft tissues are unremarkable. IMPRESSION: 1. Small right pleural effusion and right lung base atelectasis. 2. Nonobstructive bowel gas pattern. Electronically Signed   By: Angus Bark M.D.   On: 03/21/2024 12:10   DG ABD ACUTE 2+V W 1V CHEST Result Date: 03/20/2024 CLINICAL DATA:  Over the syndrome. EXAM: DG ABDOMEN ACUTE WITH 1 VIEW CHEST COMPARISON:  03/19/2024 FINDINGS: Asymmetric elevation right hemidiaphragm. The cardio pericardial silhouette is enlarged. There is pulmonary vascular congestion without overt pulmonary edema. Right PICC line tip overlies the lower SVC level. Upright film shows no evidence for intraperitoneal free air. Interval decrease in the marked gaseous distention of the stomach. The gaseous dilatation of the right and transverse colon has also decreased slightly in the interval since the previous study. No gaseous small bowel dilatation on the current study to suggest small bowel obstruction. IMPRESSION: 1. Interval decrease in the marked gaseous distention of the stomach. 2. Interval decrease in the gaseous dilatation of the right and transverse colon. 3. No evidence for intraperitoneal free air. 4. Enlargement of the cardiopericardial silhouette with pulmonary vascular congestion. Electronically Signed   By: Donnal Fusi M.D.   On: 03/20/2024 11:13    Microbiology: Results for orders placed or performed during the hospital encounter of 03/09/24  Blood culture (routine x 2)     Status: None   Collection Time: 03/09/24 11:46 AM   Specimen: BLOOD  Result Value Ref Range Status   Specimen Description BLOOD LEFT ANTECUBITAL  Final   Special Requests   Final    BOTTLES DRAWN AEROBIC AND ANAEROBIC Blood Culture results may not be optimal due to an inadequate volume of blood received in culture bottles   Culture   Final    NO GROWTH 5 DAYS Performed  at [facility], [address] [place], [place] [address]    Report Status 03/14/2024 FINAL  Final  Blood culture (routine x 2)     Status: None   Collection Time: 03/09/24 12:28 PM   Specimen: BLOOD  Result Value Ref Range Status   Specimen Description BLOOD BLOOD RIGHT HAND  Final   Special Requests   Final    BOTTLES DRAWN AEROBIC AND ANAEROBIC Blood Culture results may not be optimal due to an inadequate volume of blood received in culture bottles   Culture   Final    NO GROWTH 5 DAYS Performed at [facility], [address] [place], [place] [address]    Report Status 03/14/2024 FINAL  Final  Respiratory (~20 pathogens) panel by PCR     Status: None   Collection Time: 03/09/24  1:56 PM   Specimen: Nasopharyngeal Swab; Respiratory  Result Value Ref Range Status   Adenovirus NOT DETECTED NOT DETECTED Final   Coronavirus 229E NOT DETECTED NOT DETECTED Final    Comment: (NOTE) The Coronavirus on the Respiratory Panel, DOES NOT test for the novel  Coronavirus (2019 nCoV)    Coronavirus HKU1 NOT DETECTED NOT DETECTED Final   Coronavirus NL63 NOT DETECTED NOT DETECTED Final  Coronavirus OC43 NOT DETECTED NOT DETECTED Final   Metapneumovirus NOT DETECTED NOT DETECTED Final   Rhinovirus / Enterovirus NOT DETECTED NOT DETECTED Final   Influenza A NOT DETECTED NOT DETECTED Final   Influenza B NOT DETECTED NOT DETECTED Final   Parainfluenza Virus 1 NOT DETECTED NOT DETECTED Final   Parainfluenza Virus 2 NOT DETECTED NOT DETECTED Final   Parainfluenza Virus 3 NOT DETECTED NOT DETECTED Final   Parainfluenza Virus 4 NOT DETECTED NOT DETECTED Final   Respiratory Syncytial Virus NOT DETECTED NOT DETECTED Final   Bordetella pertussis NOT DETECTED NOT DETECTED Final   Bordetella Parapertussis NOT DETECTED NOT DETECTED Final   Chlamydophila pneumoniae NOT DETECTED NOT DETECTED Final   Mycoplasma pneumoniae NOT DETECTED NOT DETECTED Final    Comment: Performed at  [facility], [address], [place], [place] [address]  MRSA Next Gen by PCR, Nasal     Status: None   Collection Time: 03/11/24  9:32 AM   Specimen: Nasal Mucosa; Nasal Swab  Result Value Ref Range Status   MRSA by PCR Next Gen NOT DETECTED NOT DETECTED Final    Comment: (NOTE) The GeneXpert MRSA Assay (FDA approved for NASAL specimens only), is one component of a comprehensive MRSA colonization surveillance program. It is not intended to diagnose MRSA infection nor to guide or monitor treatment for MRSA infections. Test performance is not FDA approved in patients less than 57 years old. Performed at [facility], [address] [place], [place] [address]   MRSA Next Gen by PCR, Nasal     Status: None   Collection Time: 03/15/24  7:11 PM   Specimen: Nasal Mucosa; Nasal Swab  Result Value Ref Range Status   MRSA by PCR Next Gen NOT DETECTED NOT DETECTED Final    Comment: (NOTE) The GeneXpert MRSA Assay (FDA approved for NASAL specimens only), is one component of a comprehensive MRSA colonization surveillance program. It is not intended to diagnose MRSA infection nor to guide or monitor treatment for MRSA infections. Test performance is not FDA approved in patients less than 55 years old. Performed at [facility], [address] [place], [place] [address]     Labs: CBC: Recent Labs  Lab 04/11/24 2349 04/12/24 1720 04/13/24 0546 04/14/24 0525 04/15/24 0555 04/17/24 0614  WBC 19.8* 23.0* 18.0* 12.4* 7.7 6.9  NEUTROABS 17.4*  --   --   --   --   --   HGB 12.6* 11.1* 9.4* 9.8* 10.2* 11.8*  HCT 37.6* 33.9* 28.5* 30.1* 30.2* 34.7*  MCV 97.4 99.1 98.3 99.7 96.5 93.5  PLT 299 278 221 212 246 287   Basic Metabolic Panel: Recent Labs  Lab 04/13/24 0546 04/14/24 0525 04/15/24 0555 04/17/24 0614 04/18/24 0550  NA 133* 131* 135 131* 130*  K 4.5 4.5 4.1 4.0 3.9  CL 107 107 106 98 99  CO2 19* 20* 22 23 26   GLUCOSE 99 103* 96 88 98  BUN 37*  33* 22 14 15   CREATININE 2.68* 1.48* 1.09 1.06 1.21  CALCIUM  7.9* 8.1* 8.5* 8.8* 8.3*  MG 1.6*  --   --   --   --    Liver Function Tests: Recent Labs  Lab 04/12/24 0028  AST 21  ALT 27  ALKPHOS 98  BILITOT 0.7  PROT 7.4  ALBUMIN  3.1*   CBG: Recent Labs  Lab 04/12/24 1650 04/12/24 2105 04/13/24 0930 04/13/24 1200 04/13/24 1658  GLUCAP 133* 122* 133* 109* 101*  Discharge time spent: less than 30 minutes.  Signed: Montey Apa, DO [facility] 04/18/2024

## 2024-04-09 NOTE — Plan of Care (Signed)
  Problem: Fluid Volume: Goal: Hemodynamic stability will improve Outcome: Progressing   Problem: Clinical Measurements: Goal: Diagnostic test results will improve Outcome: Progressing Goal: Signs and symptoms of infection will decrease Outcome: Progressing   Problem: Respiratory: Goal: Ability to maintain adequate ventilation will improve Outcome: Progressing   Problem: Education: Goal: Knowledge of General Education information will improve Description: Including pain rating scale, medication(s)/side effects and non-pharmacologic comfort measures Outcome: Progressing   Problem: Health Behavior/Discharge Planning: Goal: Ability to manage health-related needs will improve Outcome: Progressing   Problem: Clinical Measurements: Goal: Ability to maintain clinical measurements within normal limits will improve Outcome: Progressing Goal: Will remain free from infection Outcome: Progressing Goal: Diagnostic test results will improve Outcome: Progressing Goal: Respiratory complications will improve Outcome: Progressing Goal: Cardiovascular complication will be avoided Outcome: Progressing   Problem: Activity: Goal: Risk for activity intolerance will decrease Outcome: Progressing   Problem: Nutrition: Goal: Adequate nutrition will be maintained Outcome: Progressing   Problem: Coping: Goal: Level of anxiety will decrease Outcome: Progressing   Problem: Elimination: Goal: Will not experience complications related to bowel motility Outcome: Progressing Goal: Will not experience complications related to urinary retention Outcome: Progressing   Problem: Pain Managment: Goal: General experience of comfort will improve and/or be controlled Outcome: Progressing   Problem: Safety: Goal: Ability to remain free from injury will improve Outcome: Progressing   Problem: Skin Integrity: Goal: Risk for impaired skin integrity will decrease Outcome: Progressing   Problem:  Education: Goal: Ability to describe self-care measures that may prevent or decrease complications (Diabetes Survival Skills Education) will improve Outcome: Progressing Goal: Individualized Educational Video(s) Outcome: Progressing   Problem: Coping: Goal: Ability to adjust to condition or change in health will improve Outcome: Progressing   Problem: Fluid Volume: Goal: Ability to maintain a balanced intake and output will improve Outcome: Progressing   Problem: Health Behavior/Discharge Planning: Goal: Ability to identify and utilize available resources and services will improve Outcome: Progressing Goal: Ability to manage health-related needs will improve Outcome: Progressing   Problem: Metabolic: Goal: Ability to maintain appropriate glucose levels will improve Outcome: Progressing   Problem: Nutritional: Goal: Maintenance of adequate nutrition will improve Outcome: Progressing Goal: Progress toward achieving an optimal weight will improve Outcome: Progressing   Problem: Skin Integrity: Goal: Risk for impaired skin integrity will decrease Outcome: Progressing   Problem: Tissue Perfusion: Goal: Adequacy of tissue perfusion will improve Outcome: Progressing   Problem: Activity: Goal: Risk for activity intolerance will decrease Outcome: Progressing   Problem: Elimination: Goal: Will not experience complications related to bowel motility Outcome: Progressing   Problem: Skin Integrity: Goal: Risk for impaired skin integrity will decrease Outcome: Progressing

## 2024-04-09 NOTE — Progress Notes (Signed)
 Discharge instructions reviewed with patient. IV was removed. Questions encouraged and answered. Belongings were collected by patient and family.

## 2024-04-11 ENCOUNTER — Inpatient Hospital Stay
Admission: EM | Admit: 2024-04-11 | Discharge: 2024-04-19 | DRG: 689 | Disposition: A | Discharge status: Home Health | Attending: Osteopathic Medicine | Admitting: Osteopathic Medicine

## 2024-04-11 ENCOUNTER — Other Ambulatory Visit: Payer: Self-pay

## 2024-04-11 DIAGNOSIS — Z79899 Other long term (current) drug therapy: Secondary | ICD-10-CM

## 2024-04-11 DIAGNOSIS — R6510 Systemic inflammatory response syndrome (SIRS) of non-infectious origin without acute organ dysfunction: Principal | ICD-10-CM

## 2024-04-11 DIAGNOSIS — I951 Orthostatic hypotension: Secondary | ICD-10-CM | POA: Diagnosis present

## 2024-04-11 DIAGNOSIS — J4489 Other specified chronic obstructive pulmonary disease: Secondary | ICD-10-CM | POA: Diagnosis present

## 2024-04-11 DIAGNOSIS — Z96653 Presence of artificial knee joint, bilateral: Secondary | ICD-10-CM | POA: Diagnosis present

## 2024-04-11 DIAGNOSIS — I252 Old myocardial infarction: Secondary | ICD-10-CM

## 2024-04-11 DIAGNOSIS — Z85828 Personal history of other malignant neoplasm of skin: Secondary | ICD-10-CM

## 2024-04-11 DIAGNOSIS — E041 Nontoxic single thyroid nodule: Secondary | ICD-10-CM | POA: Diagnosis present

## 2024-04-11 DIAGNOSIS — E871 Hypo-osmolality and hyponatremia: Secondary | ICD-10-CM | POA: Diagnosis present

## 2024-04-11 DIAGNOSIS — Z87891 Personal history of nicotine dependence: Secondary | ICD-10-CM

## 2024-04-11 DIAGNOSIS — I10 Essential (primary) hypertension: Secondary | ICD-10-CM | POA: Diagnosis present

## 2024-04-11 DIAGNOSIS — H9190 Unspecified hearing loss, unspecified ear: Secondary | ICD-10-CM | POA: Diagnosis present

## 2024-04-11 DIAGNOSIS — J449 Chronic obstructive pulmonary disease, unspecified: Secondary | ICD-10-CM | POA: Diagnosis present

## 2024-04-11 DIAGNOSIS — N390 Urinary tract infection, site not specified: Secondary | ICD-10-CM | POA: Diagnosis not present

## 2024-04-11 DIAGNOSIS — E43 Unspecified severe protein-calorie malnutrition: Secondary | ICD-10-CM | POA: Diagnosis present

## 2024-04-11 DIAGNOSIS — E785 Hyperlipidemia, unspecified: Secondary | ICD-10-CM | POA: Diagnosis present

## 2024-04-11 DIAGNOSIS — R198 Other specified symptoms and signs involving the digestive system and abdomen: Secondary | ICD-10-CM

## 2024-04-11 DIAGNOSIS — N400 Enlarged prostate without lower urinary tract symptoms: Secondary | ICD-10-CM | POA: Diagnosis present

## 2024-04-11 DIAGNOSIS — B961 Klebsiella pneumoniae [K. pneumoniae] as the cause of diseases classified elsewhere: Secondary | ICD-10-CM | POA: Diagnosis present

## 2024-04-11 DIAGNOSIS — Z66 Do not resuscitate: Secondary | ICD-10-CM | POA: Diagnosis present

## 2024-04-11 DIAGNOSIS — Z1152 Encounter for screening for COVID-19: Secondary | ICD-10-CM

## 2024-04-11 DIAGNOSIS — Z7901 Long term (current) use of anticoagulants: Secondary | ICD-10-CM

## 2024-04-11 DIAGNOSIS — E860 Dehydration: Secondary | ICD-10-CM | POA: Diagnosis present

## 2024-04-11 DIAGNOSIS — A419 Sepsis, unspecified organism: Secondary | ICD-10-CM | POA: Diagnosis present

## 2024-04-11 DIAGNOSIS — K5981 Ogilvie syndrome: Secondary | ICD-10-CM | POA: Diagnosis present

## 2024-04-11 DIAGNOSIS — Z6825 Body mass index (BMI) 25.0-25.9, adult: Secondary | ICD-10-CM

## 2024-04-11 DIAGNOSIS — N179 Acute kidney failure, unspecified: Secondary | ICD-10-CM | POA: Diagnosis present

## 2024-04-11 DIAGNOSIS — I5022 Chronic systolic (congestive) heart failure: Secondary | ICD-10-CM | POA: Diagnosis present

## 2024-04-11 DIAGNOSIS — E861 Hypovolemia: Secondary | ICD-10-CM | POA: Diagnosis present

## 2024-04-11 DIAGNOSIS — K219 Gastro-esophageal reflux disease without esophagitis: Secondary | ICD-10-CM | POA: Diagnosis present

## 2024-04-11 DIAGNOSIS — I110 Hypertensive heart disease with heart failure: Secondary | ICD-10-CM | POA: Diagnosis present

## 2024-04-11 DIAGNOSIS — Z7982 Long term (current) use of aspirin: Secondary | ICD-10-CM

## 2024-04-11 DIAGNOSIS — Z7951 Long term (current) use of inhaled steroids: Secondary | ICD-10-CM

## 2024-04-11 DIAGNOSIS — R140 Abdominal distension (gaseous): Secondary | ICD-10-CM

## 2024-04-11 DIAGNOSIS — Z955 Presence of coronary angioplasty implant and graft: Secondary | ICD-10-CM

## 2024-04-11 DIAGNOSIS — I2510 Atherosclerotic heart disease of native coronary artery without angina pectoris: Secondary | ICD-10-CM | POA: Diagnosis present

## 2024-04-11 DIAGNOSIS — Z932 Ileostomy status: Secondary | ICD-10-CM

## 2024-04-11 DIAGNOSIS — I2699 Other pulmonary embolism without acute cor pulmonale: Secondary | ICD-10-CM | POA: Diagnosis present

## 2024-04-11 MED ORDER — ONDANSETRON HCL 4 MG/2ML IJ SOLN
4.0000 mg | INTRAMUSCULAR | Status: AC
  Administered 2024-04-12: 4 mg via INTRAVENOUS
  Filled 2024-04-11: qty 2

## 2024-04-11 MED ORDER — LACTATED RINGERS IV BOLUS
500.0000 mL | Freq: Once | INTRAVENOUS | Status: AC
  Administered 2024-04-12: 500 mL via INTRAVENOUS

## 2024-04-11 NOTE — ED Triage Notes (Signed)
 PER EMS: pt is from home with c/o fever, vomiting and diarrhea from his colostomy bag, onset today. He was d/c'd from this hospital 2 days ago after being admitting for sepsis and bowel obstruction. Temp at home was 100.7. He is A&Ox4.  BP- 117/86, HR-104, 95% RA, CBG-143

## 2024-04-12 ENCOUNTER — Emergency Department

## 2024-04-12 DIAGNOSIS — A419 Sepsis, unspecified organism: Secondary | ICD-10-CM | POA: Diagnosis present

## 2024-04-12 DIAGNOSIS — R198 Other specified symptoms and signs involving the digestive system and abdomen: Secondary | ICD-10-CM | POA: Diagnosis not present

## 2024-04-12 DIAGNOSIS — N400 Enlarged prostate without lower urinary tract symptoms: Secondary | ICD-10-CM | POA: Insufficient documentation

## 2024-04-12 DIAGNOSIS — Z932 Ileostomy status: Secondary | ICD-10-CM | POA: Diagnosis not present

## 2024-04-12 DIAGNOSIS — R140 Abdominal distension (gaseous): Secondary | ICD-10-CM

## 2024-04-12 LAB — BASIC METABOLIC PANEL WITH GFR
Anion gap: 6 (ref 5–15)
BUN: 29 mg/dL — ABNORMAL HIGH (ref 8–23)
CO2: 22 mmol/L (ref 22–32)
Calcium: 8.4 mg/dL — ABNORMAL LOW (ref 8.9–10.3)
Chloride: 102 mmol/L (ref 98–111)
Creatinine, Ser: 1.97 mg/dL — ABNORMAL HIGH (ref 0.61–1.24)
GFR, Estimated: 33 mL/min — ABNORMAL LOW (ref >60)
Glucose, Bld: 134 mg/dL — ABNORMAL HIGH (ref 70–99)
Potassium: 4.6 mmol/L (ref 3.5–5.1)
Sodium: 130 mmol/L — ABNORMAL LOW (ref 135–145)

## 2024-04-12 LAB — GASTROINTESTINAL PANEL BY PCR, STOOL (REPLACES STOOL CULTURE)

## 2024-04-12 LAB — CBC
HCT: 33.9 % — ABNORMAL LOW (ref 39.0–52.0)
Hemoglobin: 11.1 g/dL — ABNORMAL LOW (ref 13.0–17.0)
MCH: 32.5 pg (ref 26.0–34.0)
MCHC: 32.7 g/dL (ref 30.0–36.0)
MCV: 99.1 fL (ref 80.0–100.0)
Platelets: 278 10*3/uL (ref 150–400)
RBC: 3.42 MIL/uL — ABNORMAL LOW (ref 4.22–5.81)
RDW: 14.3 % (ref 11.5–15.5)
WBC: 23 10*3/uL — ABNORMAL HIGH (ref 4.0–10.5)
nRBC: 0 % (ref 0.0–0.2)

## 2024-04-12 LAB — COMPREHENSIVE METABOLIC PANEL WITH GFR
ALT: 27 U/L (ref 0–44)
AST: 21 U/L (ref 15–41)
Albumin: 3.1 g/dL — ABNORMAL LOW (ref 3.5–5.0)
Alkaline Phosphatase: 98 U/L (ref 38–126)
Anion gap: 9 (ref 5–15)
BUN: 29 mg/dL — ABNORMAL HIGH (ref 8–23)
CO2: 20 mmol/L — ABNORMAL LOW (ref 22–32)
Calcium: 8.7 mg/dL — ABNORMAL LOW (ref 8.9–10.3)
Chloride: 101 mmol/L (ref 98–111)
Creatinine, Ser: 1.12 mg/dL (ref 0.61–1.24)
GFR, Estimated: >60 mL/min (ref >60)
Glucose, Bld: 163 mg/dL — ABNORMAL HIGH (ref 70–99)
Potassium: 4.4 mmol/L (ref 3.5–5.1)
Sodium: 130 mmol/L — ABNORMAL LOW (ref 135–145)
Total Bilirubin: 0.7 mg/dL (ref 0.0–1.2)
Total Protein: 7.4 g/dL (ref 6.5–8.1)

## 2024-04-12 LAB — CBC WITH DIFFERENTIAL/PLATELET
Abs Immature Granulocytes: 0.14 10*3/uL — ABNORMAL HIGH (ref 0.00–0.07)
Basophils Absolute: 0.1 10*3/uL (ref 0.0–0.1)
Basophils Relative: 0 %
Eosinophils Absolute: 0 10*3/uL (ref 0.0–0.5)
Eosinophils Relative: 0 %
HCT: 37.6 % — ABNORMAL LOW (ref 39.0–52.0)
Hemoglobin: 12.6 g/dL — ABNORMAL LOW (ref 13.0–17.0)
Immature Granulocytes: 1 %
Lymphocytes Relative: 4 %
Lymphs Abs: 0.8 10*3/uL (ref 0.7–4.0)
MCH: 32.6 pg (ref 26.0–34.0)
MCHC: 33.5 g/dL (ref 30.0–36.0)
MCV: 97.4 fL (ref 80.0–100.0)
Monocytes Absolute: 1.3 10*3/uL — ABNORMAL HIGH (ref 0.1–1.0)
Monocytes Relative: 7 %
Neutro Abs: 17.4 10*3/uL — ABNORMAL HIGH (ref 1.7–7.7)
Neutrophils Relative %: 88 %
Platelets: 299 10*3/uL (ref 150–400)
RBC: 3.86 MIL/uL — ABNORMAL LOW (ref 4.22–5.81)
RDW: 14 % (ref 11.5–15.5)
WBC: 19.8 10*3/uL — ABNORMAL HIGH (ref 4.0–10.5)
nRBC: 0 % (ref 0.0–0.2)

## 2024-04-12 LAB — URINALYSIS, W/ REFLEX TO CULTURE (INFECTION SUSPECTED)
Bacteria, UA: NONE SEEN
Bilirubin Urine: NEGATIVE
Glucose, UA: NEGATIVE mg/dL
Hgb urine dipstick: NEGATIVE
Ketones, ur: NEGATIVE mg/dL
Leukocytes,Ua: NEGATIVE
Nitrite: NEGATIVE
Protein, ur: NEGATIVE mg/dL
Specific Gravity, Urine: 1.04 — ABNORMAL HIGH (ref 1.005–1.030)
pH: 5 (ref 5.0–8.0)

## 2024-04-12 LAB — RESP PANEL BY RT-PCR (RSV, FLU A&B, COVID)  RVPGX2
Influenza A by PCR: NEGATIVE
Influenza B by PCR: NEGATIVE
Resp Syncytial Virus by PCR: NEGATIVE
SARS Coronavirus 2 by RT PCR: NEGATIVE

## 2024-04-12 LAB — LACTIC ACID, PLASMA
Lactic Acid, Venous: 1.3 mmol/L (ref 0.5–1.9)
Lactic Acid, Venous: 1.4 mmol/L (ref 0.5–1.9)
Lactic Acid, Venous: 1.9 mmol/L (ref 0.5–1.9)

## 2024-04-12 LAB — GLUCOSE, CAPILLARY
Glucose-Capillary: 133 mg/dL — ABNORMAL HIGH (ref 70–99)
Glucose-Capillary: 122 mg/dL — ABNORMAL HIGH (ref 70–99)

## 2024-04-12 LAB — PROTIME-INR
INR: 1.1 (ref 0.8–1.2)
Prothrombin Time: 13.9 s (ref 11.4–15.2)

## 2024-04-12 LAB — C DIFFICILE QUICK SCREEN W PCR REFLEX
C Diff antigen: NEGATIVE
C Diff interpretation: NOT DETECTED
C Diff toxin: NEGATIVE

## 2024-04-12 LAB — PROCALCITONIN: Procalcitonin: <0.1 ng/mL

## 2024-04-12 MED ORDER — SODIUM CHLORIDE 0.9 % IV SOLN: 2.0000 g | Freq: Two times a day (BID) | INTRAVENOUS | Status: DC

## 2024-04-12 MED ORDER — MELATONIN 5 MG PO TABS
5.0000 mg | ORAL_TABLET | Freq: Every day | ORAL | Status: DC
  Administered 2024-04-12 – 2024-04-18 (×7): 5 mg via ORAL
  Filled 2024-04-12 (×7): qty 1

## 2024-04-12 MED ORDER — ONDANSETRON HCL 4 MG PO TABS: 4.0000 mg | ORAL_TABLET | Freq: Four times a day (QID) | ORAL | Status: DC | PRN

## 2024-04-12 MED ORDER — IOHEXOL 300 MG/ML  SOLN
100.0000 mL | Freq: Once | INTRAMUSCULAR | Status: AC | PRN
  Administered 2024-04-12: 100 mL via INTRAVENOUS

## 2024-04-12 MED ORDER — VANCOMYCIN HCL 1250 MG/250ML IV SOLN: 1250.0000 mg | INTRAVENOUS | Status: DC

## 2024-04-12 MED ORDER — VANCOMYCIN HCL 1750 MG/350ML IV SOLN
1750.0000 mg | Freq: Once | INTRAVENOUS | Status: AC
  Administered 2024-04-12: 1750 mg via INTRAVENOUS
  Filled 2024-04-12: qty 350

## 2024-04-12 MED ORDER — SODIUM CHLORIDE 0.9 % IV SOLN: INTRAVENOUS | Status: AC

## 2024-04-12 MED ORDER — LISINOPRIL 20 MG PO TABS
20.0000 mg | ORAL_TABLET | Freq: Every day | ORAL | Status: DC
  Administered 2024-04-12: 20 mg via ORAL
  Filled 2024-04-12 (×2): qty 1

## 2024-04-12 MED ORDER — HYDROCODONE-ACETAMINOPHEN 5-325 MG PO TABS
1.0000 | ORAL_TABLET | ORAL | Status: DC | PRN
  Administered 2024-04-12 – 2024-04-14 (×3): 2 via ORAL
  Administered 2024-04-15 (×2): 1 via ORAL
  Administered 2024-04-16: 2 via ORAL
  Filled 2024-04-12 (×2): qty 2
  Filled 2024-04-12: qty 1
  Filled 2024-04-12: qty 2
  Filled 2024-04-12: qty 1
  Filled 2024-04-12: qty 2
  Filled 2024-04-12: qty 1

## 2024-04-12 MED ORDER — ACETAMINOPHEN 650 MG RE SUPP: 650.0000 mg | Freq: Four times a day (QID) | RECTAL | Status: DC | PRN

## 2024-04-12 MED ORDER — BACLOFEN 10 MG PO TABS
10.0000 mg | ORAL_TABLET | Freq: Three times a day (TID) | ORAL | Status: DC | PRN
  Administered 2024-04-13 – 2024-04-14 (×4): 10 mg via ORAL
  Filled 2024-04-12 (×4): qty 1

## 2024-04-12 MED ORDER — ENOXAPARIN SODIUM 40 MG/0.4ML IJ SOSY
40.0000 mg | PREFILLED_SYRINGE | INTRAMUSCULAR | Status: DC
  Administered 2024-04-12 – 2024-04-13 (×2): 40 mg via SUBCUTANEOUS
  Filled 2024-04-12 (×2): qty 0.4

## 2024-04-12 MED ORDER — MOMETASONE FURO-FORMOTEROL FUM 200-5 MCG/ACT IN AERO
2.0000 | INHALATION_SPRAY | Freq: Two times a day (BID) | RESPIRATORY_TRACT | Status: DC
  Administered 2024-04-12 – 2024-04-19 (×15): 2 via RESPIRATORY_TRACT
  Filled 2024-04-12: qty 8.8

## 2024-04-12 MED ORDER — SODIUM CHLORIDE 0.9 % IV BOLUS
500.0000 mL | Freq: Once | INTRAVENOUS | Status: AC
  Administered 2024-04-12: 500 mL via INTRAVENOUS

## 2024-04-12 MED ORDER — SODIUM CHLORIDE 0.9 % IV BOLUS
1000.0000 mL | Freq: Once | INTRAVENOUS | Status: AC
  Administered 2024-04-12: 1000 mL via INTRAVENOUS

## 2024-04-12 MED ORDER — DICLOFENAC SODIUM 1 % EX GEL
2.0000 g | Freq: Four times a day (QID) | CUTANEOUS | Status: DC | PRN
  Administered 2024-04-13 – 2024-04-17 (×4): 2 g via TOPICAL
  Filled 2024-04-12: qty 100

## 2024-04-12 MED ORDER — ROSUVASTATIN CALCIUM 10 MG PO TABS
20.0000 mg | ORAL_TABLET | Freq: Every day | ORAL | Status: DC
  Administered 2024-04-12 – 2024-04-19 (×8): 20 mg via ORAL
  Filled 2024-04-12 (×2): qty 2
  Filled 2024-04-12: qty 1
  Filled 2024-04-12 (×6): qty 2

## 2024-04-12 MED ORDER — LACTATED RINGERS IV SOLN
150.0000 mL/h | INTRAVENOUS | Status: DC
  Administered 2024-04-12: 150 mL/h via INTRAVENOUS

## 2024-04-12 MED ORDER — MIDODRINE HCL 5 MG PO TABS
10.0000 mg | ORAL_TABLET | Freq: Once | ORAL | Status: AC
  Administered 2024-04-12: 10 mg via ORAL
  Filled 2024-04-12: qty 2

## 2024-04-12 MED ORDER — NITROGLYCERIN 0.4 MG SL SUBL: 0.4000 mg | SUBLINGUAL_TABLET | SUBLINGUAL | Status: DC | PRN

## 2024-04-12 MED ORDER — ALBUTEROL SULFATE (2.5 MG/3ML) 0.083% IN NEBU: 2.5000 mg | INHALATION_SOLUTION | Freq: Four times a day (QID) | RESPIRATORY_TRACT | Status: DC | PRN

## 2024-04-12 MED ORDER — MIDODRINE HCL 5 MG PO TABS
5.0000 mg | ORAL_TABLET | Freq: Three times a day (TID) | ORAL | Status: DC
  Administered 2024-04-12: 5 mg via ORAL
  Filled 2024-04-12: qty 1

## 2024-04-12 MED ORDER — LACTATED RINGERS IV BOLUS
500.0000 mL | Freq: Once | INTRAVENOUS | Status: AC
  Administered 2024-04-12: 500 mL via INTRAVENOUS

## 2024-04-12 MED ORDER — MORPHINE SULFATE (PF) 2 MG/ML IV SOLN
2.0000 mg | Freq: Once | INTRAVENOUS | Status: AC
  Administered 2024-04-12: 2 mg via INTRAVENOUS
  Filled 2024-04-12: qty 1

## 2024-04-12 MED ORDER — ONDANSETRON HCL 4 MG/2ML IJ SOLN
4.0000 mg | Freq: Four times a day (QID) | INTRAMUSCULAR | Status: DC | PRN
Start: 2024-04-12 — End: 2024-04-19

## 2024-04-12 MED ORDER — ASPIRIN 81 MG PO TBEC
81.0000 mg | DELAYED_RELEASE_TABLET | Freq: Every day | ORAL | Status: DC
  Administered 2024-04-12 – 2024-04-19 (×8): 81 mg via ORAL
  Filled 2024-04-12 (×8): qty 1

## 2024-04-12 MED ORDER — SODIUM CHLORIDE 0.9 % IV SOLN
2.0000 g | Freq: Once | INTRAVENOUS | Status: AC
  Administered 2024-04-12: 2 g via INTRAVENOUS
  Filled 2024-04-12: qty 12.5

## 2024-04-12 MED ORDER — METRONIDAZOLE 500 MG/100ML IV SOLN
500.0000 mg | Freq: Once | INTRAVENOUS | Status: AC
  Administered 2024-04-12: 500 mg via INTRAVENOUS
  Filled 2024-04-12: qty 100

## 2024-04-12 MED ORDER — METRONIDAZOLE 500 MG/100ML IV SOLN: 500.0000 mg | Freq: Two times a day (BID) | INTRAVENOUS | Status: DC

## 2024-04-12 MED ORDER — ACETAMINOPHEN 325 MG PO TABS
650.0000 mg | ORAL_TABLET | Freq: Four times a day (QID) | ORAL | Status: DC | PRN
  Administered 2024-04-12 – 2024-04-17 (×6): 650 mg via ORAL
  Filled 2024-04-12 (×7): qty 2

## 2024-04-12 NOTE — Assessment & Plan Note (Addendum)
 Source uncertain-possible GI in view of vomiting and liquid  ileostomy output Negative C. difficile and GI panel CT abdomen and pelvis showing possible posterior bibasilar atelectasis and/or infiltrate Sepsis criteria include fever, tachycardia, leukocytosis Broad-spectrum antibiotics with vancomycin  cefepime  and Flagyl  Sepsis fluids Follow cultures and de-escalate as needed Consider ID consult

## 2024-04-12 NOTE — Progress Notes (Signed)
 ED Pharmacy Antibiotic Sign Off An antibiotic consult was received from an ED provider for Vancomycin , cefepime  per pharmacy dosing for sepsis. A chart review was completed to assess appropriateness.   The following one time order(s) were placed:  Vancomycin  1750 mg IV X 1 and cefepime  2 gm IV X 1.   Further antibiotic and/or antibiotic pharmacy consults should be ordered by the admitting provider if indicated.   Thank you for allowing pharmacy to be a part of this patient's care.   Alvenia Job [facility]  Clinical Pharmacist 04/12/24 3:57 AM

## 2024-04-12 NOTE — Progress Notes (Signed)
   04/12/24 1645  Spiritual Encounters  Type of Visit Initial  Care provided to: Pt and family  Conversation partners present during encounter Nurse  Reason for visit Code  OnCall Visit Yes   Chaplain visited patient and family due to RRT.  Chaplain found family tearful and concerned.  Offered a compassionate presence and empathy.  Chaplain saw patient when stable and did a spiritual assessment and found patient and family welcomed prayer.  Chaplain prayed and shared that she'd check on the patient in the morning.    Rev. Rana M. Nolon Baxter, M.Div. Chaplain Resident [facility]

## 2024-04-12 NOTE — TOC Transition Note (Signed)
 Transition of Care [facility]) - Discharge Note   Patient Details  Name: Todd Rojas MRN: [id number] Date of Birth: 12-30-1940  Transition of Care [facility]) CM/SW Contact:  Odilia Bennett, LCSW Phone Number: 04/12/2024, 4:12 PM   Clinical Narrative:  Patient has orders to discharge home today. [facility] liaison is aware. No further concerns. CSW signing off.   Final next level of care: Home w Home Health Services Barriers to Discharge: Barriers Resolved   Patient Goals and CMS Choice            Discharge Placement                Patient to be transferred to facility by: Family Name of family member notified: Sabino Denning, Benigno Brakeman Patient and family notified of of transfer: 04/12/24  Discharge Plan and Services Additional resources added to the After Visit Summary for                            [facility] Arranged: RN, PT, OT [facility] Agency: [facility] Date [facility] Agency Contacted: 04/12/24   Representative spoke with at [facility] Agency: [place]   Social Drivers of Health (SDOH) Interventions SDOH Screenings   Food Insecurity: No Food Insecurity (04/12/2024)  Housing: Patient Declined (04/12/2024)  Transportation Needs: No Transportation Needs (04/12/2024)  Utilities: Not At Risk (04/12/2024)  Depression (PHQ2-9): Low Risk  (07/12/2021)  Financial Resource Strain: Low Risk  (10/23/2023)   Received from [facility]  Social Connections: Unknown (04/12/2024)  Tobacco Use: Medium Risk (04/11/2024)     Readmission Risk Interventions     No data to display

## 2024-04-12 NOTE — ED Notes (Signed)
 Patient resting with eyes closed.  RR even and unlabored.  Patient responds quickly to verbal stimuli or light touch.  No needs voiced at present.  No acute distress noted.

## 2024-04-12 NOTE — ED Provider Notes (Signed)
 [facility] Provider Note    Event Date/Time   First MD Initiated Contact with Patient 04/11/24 2344     (approximate)   History   Fever   HPI  Todd Rojas is a 83 y.o. male recently discharged.  His previous discharge summary is not yet completed but did note severe sepsis, respiratory failure, coronary disease COPD.  Is also noted the patient had a robot-assisted diagnostic laparoscopy documented in April of this year, also noted on surgery rounding to note that the patient has a history of laparoscopic ileostomy for Ogilvie syndrome   Reports doing well the last couple days them again having 1 episode of vomiting chills and a fever.  EMS reports fever to 100.7.  He reports no pain slight nausea.  Not having a stomach pain.  Reports he had sort of loose stools the last few days but now seeing more watery stool clear.  No bloody.  No bloody emesis.  Denies abdominal pain.  Denies pain anywhere no rashes or insect bites that he knows of or headache  Physical Exam   Triage Vital Signs: ED Triage Vitals  Encounter Vitals Group     BP 04/11/24 2340 122/65     Systolic BP Percentile --      Diastolic BP Percentile --      Pulse Rate 04/11/24 2340 (!) 101     Resp 04/11/24 2340 16     Temp 04/11/24 2340 99 F (37.2 C)     Temp Source 04/11/24 2340 Oral     SpO2 04/11/24 2340 95 %     Weight 04/11/24 2338 173 lb (78.5 kg)     Height 04/11/24 2338 5\' 9"  (1.753 m)     Head Circumference --      Peak Flow --      Pain Score --      Pain Loc --      Pain Education --      Exclude from Growth Chart --     Most recent vital signs: Vitals:   04/12/24 0430 04/12/24 0500  BP: 112/66 114/66  Pulse: (!) 104 (!) 101  Resp: (!) 26 (!) 23  Temp:    SpO2: 94% 91%     General: Awake, no distress.  Chronically ill-appearing but in no acute distress CV:  Good peripheral perfusion.  Mild tachycardia.  Normal tones Resp:  Normal effort.  Normal  respirations clear bilateral Abd:  No distention.  Soft nontender nondistended throughout.  Right lower quadrant ostomy purple protuberant, draining green formed stool as well as mixed watery stool Other:  Warm well-perfused extremities.  No rashes noted.   ED Results / Procedures / Treatments   Labs (all labs ordered are listed, but only abnormal results are displayed) Labs Reviewed  CBC WITH DIFFERENTIAL/PLATELET - Abnormal; Notable for the following components:      Result Value   WBC 19.8 (*)    RBC 3.86 (*)    Hemoglobin 12.6 (*)    HCT 37.6 (*)    Neutro Abs 17.4 (*)    Monocytes Absolute 1.3 (*)    Abs Immature Granulocytes 0.14 (*)    All other components within normal limits  URINALYSIS, W/ REFLEX TO CULTURE (INFECTION SUSPECTED) - Abnormal; Notable for the following components:   Color, Urine YELLOW (*)    APPearance HAZY (*)    Specific Gravity, Urine 1.040 (*)    All other components within normal limits  COMPREHENSIVE METABOLIC PANEL  WITH GFR - Abnormal; Notable for the following components:   Sodium 130 (*)    CO2 20 (*)    Glucose, Bld 163 (*)    BUN 29 (*)    Calcium  8.7 (*)    Albumin  3.1 (*)    All other components within normal limits  CULTURE, BLOOD (ROUTINE X 2)  CULTURE, BLOOD (ROUTINE X 2)  GASTROINTESTINAL PANEL BY PCR, STOOL (REPLACES STOOL CULTURE)  C DIFFICILE QUICK SCREEN W PCR REFLEX    RESP PANEL BY RT-PCR (RSV, FLU A&B, COVID)  [id number]  URINE CULTURE  LACTIC ACID, PLASMA  LACTIC ACID, PLASMA  PROTIME-INR  PROCALCITONIN   Labs reviewed and interpreted as significant leukocytosis  EKG  And interpreted by me at 2345 heart rate 90 QRS 100 QTc 440 Sinus tachycardia.  A mild but nonspecific T wave abnormality is observed in lead III and aVL, when compared to his previous ECG this morphology is in keeping with old.  RADIOLOGY   Chest x-ray interpreted by me as no obvious infiltrate   CT ABDOMEN PELVIS W CONTRAST Result Date:  04/12/2024 CLINICAL DATA:  Recent abdominal surgery presenting with fever, leukocytosis and vomiting. EXAM: CT ABDOMEN AND PELVIS WITH CONTRAST TECHNIQUE: Multidetector CT imaging of the abdomen and pelvis was performed using the standard protocol following bolus administration of intravenous contrast. RADIATION DOSE REDUCTION: This exam was performed according to the departmental dose-optimization program which includes automated exposure control, adjustment of the mA and/or kV according to patient size and/or use of iterative reconstruction technique. CONTRAST:  OMNIPAQUE  IOHEXOL  300 MG/ML  SOLN COMPARISON:  April 02, 2024 FINDINGS: Lower chest: Stable moderate to marked severity areas of atelectasis and/or infiltrate are seen within the posterior aspects of the bilateral lung bases. Hepatobiliary: A stable 3 mm focus of parenchymal low attenuation is seen within the anterior aspect of the left lobe of the liver (axial CT image 25, CT series 2). No gallstones, gallbladder wall thickening, or biliary dilatation. Pancreas: Unremarkable. No pancreatic ductal dilatation or surrounding inflammatory changes. Spleen: Normal in size without focal abnormality. Adrenals/Urinary Tract: Adrenal glands are unremarkable. Kidneys are normal in size, without renal calculi or hydronephrosis. A stable simple cyst is seen within the right kidney. Areas of focal cortical scarring are noted within the anterolateral aspect of the left kidney. There is moderate severity dilatation of a short segment of the distal left ureter which extends to the level of the left UVJ. Bladder is unremarkable. Stomach/Bowel: The enteric tube seen on the prior study has been removed. There is a very small hiatal hernia. Appendix appears normal. No evidence of bowel wall thickening, distention, or inflammatory changes. Noninflamed diverticula are seen scattered throughout the sigmoid colon. Vascular/Lymphatic: Aortic atherosclerosis. No enlarged  abdominal or pelvic lymph nodes. Reproductive: There is moderate to marked severity prostate gland enlargement. A stable 2.3 cm x 1.8 cm x 2.8 cm area of increased attenuation (approximately 124.31 Hounsfield units) is seen within the posterolateral aspect of the prostate gland on the right (axial CT image 95, CT series 2) Other: A right pelvic ostomy site is again seen. Small, stable bilateral fat containing inguinal hernias are also noted. No abdominopelvic ascites. Musculoskeletal: Multilevel degenerative changes are seen throughout the lumbar spine. IMPRESSION: 1. Stable moderate to marked severity posterior bibasilar atelectasis and/or infiltrate. 2. 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 3. Prostatomegaly with an area of increased parenchymal attenuation which may represent abnormal parenchymal enhancement. Correlation with PSA levels is recommended. Electronically Signed  By: Virgle Grime M.D.   On: 04/12/2024 01:43   DG Chest 2 View Result Date: 04/12/2024 CLINICAL DATA:  Possible sepsis EXAM: CHEST - 2 VIEW COMPARISON:  04/05/2024 FINDINGS: Elevation of the right diaphragm with atelectasis or scar at the right base. No focal consolidation, pleural effusion or pneumothorax. Stable cardiomediastinal silhouette. IMPRESSION: No active cardiopulmonary disease. Elevation of the right diaphragm with atelectasis or scar at the right base. Electronically Signed   By: Esmeralda Hedge M.D.   On: 04/12/2024 00:20       PROCEDURES:  Critical Care performed: No  Procedures   MEDICATIONS ORDERED IN ED: Medications  vancomycin  (VANCOREADY) IVPB 1750 mg/350 mL (1,750 mg Intravenous New Bag/Given 04/12/24 0433)  aspirin  EC tablet 81 mg (has no administration in time range)  lisinopril  (ZESTRIL ) tablet 20 mg (has no administration in time range)  rosuvastatin  (CRESTOR ) tablet 20 mg (has no administration in time range)  nitroGLYCERIN  (NITROSTAT ) SL tablet 0.4 mg (has no  administration in time range)  melatonin tablet 5 mg (has no administration in time range)  baclofen  (LIORESAL ) tablet 10 mg (has no administration in time range)  mometasone -formoterol  (DULERA ) 200-5 MCG/ACT inhaler 2 puff (has no administration in time range)  albuterol  (PROVENTIL ) (2.5 MG/3ML) 0.083% nebulizer solution 2.5 mg (has no administration in time range)  lactated ringers  infusion (150 mL/hr Intravenous New Bag/Given 04/12/24 0424)  enoxaparin  (LOVENOX ) injection 40 mg (has no administration in time range)  metroNIDAZOLE  (FLAGYL ) IVPB 500 mg (has no administration in time range)  acetaminophen  (TYLENOL ) tablet 650 mg (has no administration in time range)    Or  acetaminophen  (TYLENOL ) suppository 650 mg (has no administration in time range)  HYDROcodone -acetaminophen  (NORCO/VICODIN) 5-325 MG per tablet 1-2 tablet (has no administration in time range)  ondansetron  (ZOFRAN ) tablet 4 mg (has no administration in time range)    Or  ondansetron  (ZOFRAN ) injection 4 mg (has no administration in time range)  ceFEPIme  (MAXIPIME ) 2 g in sodium chloride  0.9 % 100 mL IVPB (has no administration in time range)  vancomycin  (VANCOREADY) IVPB 1250 mg/250 mL (has no administration in time range)  ondansetron  (ZOFRAN ) injection 4 mg (4 mg Intravenous Given 04/12/24 0031)  lactated ringers  bolus 500 mL (0 mLs Intravenous Stopped 04/12/24 0138)  iohexol  (OMNIPAQUE ) 300 MG/ML solution 100 mL (100 mLs Intravenous Contrast Given 04/12/24 0102)  metroNIDAZOLE  (FLAGYL ) IVPB 500 mg (0 mg Intravenous Stopped 04/12/24 0410)  morphine  (PF) 2 MG/ML injection 2 mg (2 mg Intravenous Given 04/12/24 0356)  ceFEPIme  (MAXIPIME ) 2 g in sodium chloride  0.9 % 100 mL IVPB (0 g Intravenous Stopped 04/12/24 0433)     IMPRESSION / MDM / ASSESSMENT AND PLAN / ED COURSE  I reviewed the triage vital signs and the nursing notes.                              Differential diagnosis includes, but is not limited to, infection,  inflammatory process, medication effect, thromboembolism though this seems quite unlikely given his associated symptomatology, etc.  Differential diagnosis quite broad but given his recent hospitalization reported history of infection, review of his prior discharge notes recent surgical history it seems an infection would be a potential prominent cause.  Patient's presentation is most consistent with acute complicated illness / injury requiring diagnostic workup.      Clinical Course as of 04/12/24 0509  Fri Apr 12, 2024  0054 Significant leukocytosis in combination with the patient's  prehospital fevers concerning for sepsis.  No source yet identified awaiting further studies at this time.  Studies pending including CT abdomen pelvis, urinalysis, stool studies [MQ]  0205 Awaiting urinalysis.  Will send procalcitonin, query if potential ongoing pneumonia though the patient's lack of respiratory symptoms seems to argue against this being an ongoing pneumonia though this certainly remains in the differential. [MQ]  0305 CT scan, urinalysis, stool studies negative for obvious infection.  Question if this could be ongoing pulmonary or pneumonia.  Procalcitonin is pending.  Patient certainly meets SIRS criteria, will discuss with hospitalist choice of and decision to potentially reinstitute antibiotic therapy. [MQ]  0310 Updated patient and family on plan for admission.  Patient agreeable.  Plan to discuss antibiotic selection with internal medicine team [MQ]    Clinical Course User Index [MQ] Iver Marker, MD   I consulted with our hospitalist patient accepted to hospitalist under the care of Dr. Vallarie Gauze.  We discussed and will cover him  with broad-spectrum antibiotics as we await further culture data.  To this point I do not have a clear source of infection but symptoms seem to be highly suggestive of potential high risk for bacteremia or other infectious cause.  Patient and his family at the bedside  all understanding agreeable with plan for admission.  Stable for admission to hospitalist  FINAL CLINICAL IMPRESSION(S) / ED DIAGNOSES   Final diagnoses:  SIRS (systemic inflammatory response syndrome) (HCC)     Rx / DC Orders   ED Discharge Orders     None        Note:  This document was prepared using Dragon voice recognition software and may include unintentional dictation errors.   Iver Marker, MD 04/12/24 [id number]

## 2024-04-12 NOTE — Assessment & Plan Note (Signed)
 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 Difficile Intake and output monitoring

## 2024-04-12 NOTE — Assessment & Plan Note (Signed)
 For outpatient workup

## 2024-04-12 NOTE — Progress Notes (Signed)
 Pharmacy Antibiotic Note  Todd Rojas is a 83 y.o. male admitted on 04/11/2024 with  infection of unknown source .  Pharmacy has been consulted for Vancomycin , Cefepime  dosing.  Plan: Cefepime  2 gm IV X 1 given in ED on 4/25 @ 0406.  Cefepime  2 gm IV Q12H ordered to start on 4/25 @ 1600.  Vancomycin  1750 mg IV X 1 ordered for 4/25 @ ~ 0500. Vancomycin  1250 mg IV Q24H ordered to start on 4/26 @ 0500.  AUC = 482.1 Vanc trough = 11.8   Height: 5\' 9"  (175.3 cm) Weight: 78.5 kg (173 lb) IBW/kg (Calculated) : 70.7  Temp (24hrs), Avg:98.9 F (37.2 C), Min:98.7 F (37.1 C), Max:99 F (37.2 C)  Recent Labs  Lab 04/08/24 0446 04/11/24 2349 04/12/24 0028 04/12/24 0145  WBC  --  19.8*  --   --   CREATININE 0.78  --  1.12  --   LATICACIDVEN  --  1.9  --  1.4    Estimated Creatinine Clearance: 50 mL/min (by C-G formula based on SCr of 1.12 mg/dL).    No Known Allergies  Antimicrobials this admission:   >>    >>   Dose adjustments this admission:   Microbiology results:  BCx:   UCx:    Sputum:    MRSA PCR:   Thank you for allowing pharmacy to be a part of this patient's care.  Waynette Towers D 04/12/2024 4:22 AM

## 2024-04-12 NOTE — Care Management Obs Status (Signed)
 MEDICARE OBSERVATION STATUS NOTIFICATION   Patient Details  Name: Todd Rojas MRN: [id number] Date of Birth: [birth year]   Medicare Observation Status Notification Given:  Yes    Odilia Bennett, LCSW 04/12/2024, 4:11 PM

## 2024-04-12 NOTE — Care Management CC44 (Signed)
 Condition Code 44 Documentation Completed  Patient Details  Name: [name] MRN: [id number] Date of Birth: 1941-09-09   Condition Code 44 given:  Yes Patient signature on Condition Code 44 notice:  Yes Documentation of 2 MD's agreement:  Yes Code 44 added to claim:  Yes    Odilia Bennett, LCSW 04/12/2024, 4:11 PM

## 2024-04-12 NOTE — Progress Notes (Addendum)
 Rapid Response Event Note   Reason for Call : Per nurse patient went unresponsive when standing to get dressed at discharge.     Initial Focused Assessment: Upon arrival patient laying bed, alert and orientd, primary nurse obtaining vitals, see vital sign flowsheet.       Interventions: Peripheral IV started and 1000ml Normal saline bolus started per MD order.  Blood work ordered.    Plan of Care: Off unit telemetry ordered. NS bolus.     Event Summary:  Patient to remain in room. No need for transfer at this time per MD.  MD Notified: 16:40 Call Time:16:42 Arrival Time:16:42 End Time:17:30  Algie Ingle, RN

## 2024-04-12 NOTE — Plan of Care (Signed)

## 2024-04-12 NOTE — ED Notes (Signed)
 Patient transported to X-ray

## 2024-04-12 NOTE — Assessment & Plan Note (Signed)
 Not acutely exacerbated DuoNebs as needed Continue home inhaler

## 2024-04-12 NOTE — Discharge Summary (Incomplete)
 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  He reports that his ileostomy output is very liquid whereas previously it was more solid.  Also has left mid back pain that started in the past 24 hours.  He denies dysuria, chest pain or shortness of breath ED course and data review: Tmax 99 and tachycardic to 101 tachypneic to the low 20s BP 122/65 Labs notable for the following:  WBC 19,800 With lactic acid  1.9-->1.4 and procalcitonin less than 0.10 Respiratory viral panel negative GI panel and stool for C. difficile negative Urinalysis unremarkable CMP notable for hyponatremia of 130 EKG, personally reviewed and interpreted with sinus at 95 with no acute ST-T wave changes Chest x-ray with no acute cardiopulmonary disease CT abdomen and pelvis with contrast concerning for possible infiltrate among other findings as follows: IMPRESSION: 1. Stable moderate to marked severity posterior bibasilar atelectasis and/or infiltrate. 2. 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 3. Prostatomegaly with an area of increased parenchymal attenuation which may represent abnormal parenchymal enhancement. Correlation with PSA levels is recommended.     Patient started on sepsis fluids and broad-spectrum antibiotics for sepsis of unknown source, cefepime , vancomycin  and Flagyl   All other chronic conditions were treated with home medications.    Discharge Condition: Good, improved Recommended discharge diet: Regular healthy diet  Consultations: None   Procedures/Studies: None   Allergies as of 04/12/2024   No Known Allergies      Medication List     PAUSE taking these medications    amLODipine  5 MG tablet Wait to take this until your doctor or other care provider tells you to start again. Commonly known as: NORVASC  Take 1 tablet (5 mg total) by mouth daily.   furosemide  20 MG tablet Wait to take this until your doctor or other care provider tells you to start again. Commonly known as: LASIX  Take 20 mg by mouth daily.   lisinopril  20 MG tablet Wait to take this until your doctor or other care provider tells you to start again. Commonly known as: ZESTRIL  Take 20 mg by mouth daily.  TAKE these medications    acetaminophen  500 MG tablet Commonly known as: TYLENOL  Take 1,000 mg by mouth every 8 (eight) hours as needed for mild pain.   albuterol  108  (90 Base) MCG/ACT inhaler Commonly known as: VENTOLIN  HFA Inhale 1 puff into the lungs every 6 (six) hours as needed for wheezing or shortness of breath.   allopurinol  300 MG tablet Commonly known as: ZYLOPRIM  Take 300 mg by mouth daily.   aspirin  EC 81 MG tablet Take 81 mg by mouth daily.   baclofen  10 MG tablet Commonly known as: LIORESAL  Take 1 tablet (10 mg total) by mouth 3 (three) times daily as needed for muscle spasms.   budesonide -formoterol  160-4.5 MCG/ACT inhaler Commonly known as: SYMBICORT  Inhale 2 puffs into the lungs 2 (two) times daily.   colchicine 0.6 MG tablet Take 0.6 mg by mouth daily.   cyanocobalamin  1000 MCG tablet Commonly known as: VITAMIN B12 Take 1,000 mcg by mouth daily.   diclofenac  Sodium 1 % Gel Commonly known as: VOLTAREN  Apply 2 g topically 3 (three) times daily.   Melatonin 10 MG Tabs Take 5-10 mg by mouth at bedtime.   multivitamin with minerals Tabs tablet Take 1 tablet by mouth daily.   nitroGLYCERIN  0.4 MG SL tablet Commonly known as: NITROSTAT  Place under the tongue.   ondansetron  4 MG disintegrating tablet Commonly known as: ZOFRAN -ODT Take 1 tablet (4 mg total) by mouth every 8 (eight) hours as needed for nausea or vomiting.   pantoprazole  40 MG tablet Commonly known as: PROTONIX  Take 1 tablet (40 mg total) by mouth daily.   rosuvastatin  20 MG tablet Commonly known as: CRESTOR  Take 20 mg by mouth daily.   [employer] Tuberculin Syringe 25G X 1" 1 ML Misc Generic drug: Tuberculin-Allergy Syringes       Subjective   Pt reports feeling much better.   All questions and concerns were addressed at time of discharge.  Objective  Blood pressure 103/60, pulse 82, temperature 97.6 F (36.4 C), resp. rate 19, height 5\' 9"  (1.753 m), weight 78.5 kg, SpO2 92%.   General: Pt is alert, awake, not in acute distress Cardiovascular: RRR, S1/S2 +, no rubs, no gallops Respiratory: CTA bilaterally, no wheezing, no  rhonchi Abdominal: Soft, NT, ND, bowel sounds +. Surgical wounds healing well including ileostomy. No signs of infection. No rebound or guarding. Negative CVA tenderness to percussion. Extremities: no edema, no cyanosis  The results of significant diagnostics from this hospitalization (including imaging, microbiology, ancillary and laboratory) are listed below for reference.   Imaging studies: CT ABDOMEN PELVIS W CONTRAST Result Date: 04/12/2024 CLINICAL DATA:  Recent abdominal surgery presenting with fever, leukocytosis and vomiting. EXAM: CT ABDOMEN AND PELVIS WITH CONTRAST TECHNIQUE: Multidetector CT imaging of the abdomen and pelvis was performed using the standard protocol following bolus administration of intravenous contrast. RADIATION DOSE REDUCTION: This exam was performed according to the departmental dose-optimization program which includes automated exposure control, adjustment of the mA and/or kV according to patient size and/or use of iterative reconstruction technique. CONTRAST:  OMNIPAQUE  IOHEXOL  300 MG/ML  SOLN COMPARISON:  April 02, 2024 FINDINGS: Lower chest: Stable moderate to marked severity areas of atelectasis and/or infiltrate are seen within the posterior aspects of the bilateral lung bases. Hepatobiliary: A stable 3 mm focus of parenchymal low attenuation is seen within the anterior aspect of the left lobe of the liver (axial CT image 25, CT series 2). No gallstones, gallbladder wall thickening, or biliary dilatation. Pancreas: Unremarkable. No pancreatic  ductal dilatation or surrounding inflammatory changes. Spleen: Normal in size without focal abnormality. Adrenals/Urinary Tract: Adrenal glands are unremarkable. Kidneys are normal in size, without renal calculi or hydronephrosis. A stable simple cyst is seen within the right kidney. Areas of focal cortical scarring are noted within the anterolateral aspect of the left kidney. There is moderate severity dilatation of a short  segment of the distal left ureter which extends to the level of the left UVJ. Bladder is unremarkable. Stomach/Bowel: The enteric tube seen on the prior study has been removed. There is a very small hiatal hernia. Appendix appears normal. No evidence of bowel wall thickening, distention, or inflammatory changes. Noninflamed diverticula are seen scattered throughout the sigmoid colon. Vascular/Lymphatic: Aortic atherosclerosis. No enlarged abdominal or pelvic lymph nodes. Reproductive: There is moderate to marked severity prostate gland enlargement. A stable 2.3 cm x 1.8 cm x 2.8 cm area of increased attenuation (approximately 124.31 Hounsfield units) is seen within the posterolateral aspect of the prostate gland on the right (axial CT image 95, CT series 2) Other: A right pelvic ostomy site is again seen. Small, stable bilateral fat containing inguinal hernias are also noted. No abdominopelvic ascites. Musculoskeletal: Multilevel degenerative changes are seen throughout the lumbar spine. IMPRESSION: 1. Stable moderate to marked severity posterior bibasilar atelectasis and/or infiltrate. 2. 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 3. Prostatomegaly with an area of increased parenchymal attenuation which may represent abnormal parenchymal enhancement. Correlation with PSA levels is recommended. Electronically Signed   By: Virgle Grime M.D.   On: 04/12/2024 01:43   DG Chest 2 View Result Date: 04/12/2024 CLINICAL DATA:  Possible sepsis EXAM: CHEST - 2 VIEW COMPARISON:  04/05/2024 FINDINGS: Elevation of the right diaphragm with atelectasis or scar at the right base. No focal consolidation, pleural effusion or pneumothorax. Stable cardiomediastinal silhouette. IMPRESSION: No active cardiopulmonary disease. Elevation of the right diaphragm with atelectasis or scar at the right base. Electronically Signed   By: Esmeralda Hedge M.D.   On: 04/12/2024 00:20   DG ABD ACUTE 2+V W 1V  CHEST Result Date: 04/05/2024 CLINICAL DATA:  Ileus EXAM: DG ABDOMEN ACUTE WITH 1 VIEW CHEST COMPARISON:  Chest and abdomen radiograph dated 04/03/2024 FINDINGS: Lines/tubes: Gastric/enteric tube tip projects over the stomach with side hole projecting over the gastric cardia. Right upper extremity PICC tip terminates at the superior cavoatrial junction. Chest: Unchanged elevation of the right hemidiaphragm. Linear bibasilar opacities, likely atelectasis. No pneumothorax or pleural effusion. Normal heart size. Abdomen: A few dilated loops of small bowel are present within the left hemiabdomen. Decreased conspicuity of the previously noted dilated contrast material. No pneumatosis or free air. No abnormal calcification or mass effect. Bones: No acute osseous abnormality. IMPRESSION: 1. A few dilated loops of small bowel are present within the left hemiabdomen, likely ileus. Partial small bowel obstruction could have a similar appearance. Decreased conspicuity of the previously noted dilated contrast material suggesting interval passage. 2. Gastric/enteric tube tip projects over the stomach with side hole projecting over the gastric cardia. Consider advancing by 5 cm for more optimal positioning. Electronically Signed   By: Limin  Xu M.D.   On: 04/05/2024 10:44   DG ABD ACUTE 2+V W 1V CHEST Result Date: 04/03/2024 CLINICAL DATA:  Ileus after gastrointestinal surgery EXAM: DG ABDOMEN ACUTE WITH 1 VIEW CHEST COMPARISON:  Yesterday FINDINGS: Low volume chest with elevated right diaphragm and overlying atelectasis or scar. Contrast is seen within the bladder and within bowel loops. Bowel distension has improved. An  enteric tube tip and side-port reaches the collapsed stomach. Right PICC with tip at the SVC. IMPRESSION: Improving bowel distension, although still abnormal with dilute contrast seen from CT yesterday. Electronically Signed   By: Ronnette Coke M.D.   On: 04/03/2024 11:10   CT ABDOMEN PELVIS W  CONTRAST Result Date: 04/02/2024 CLINICAL DATA:  Bowel obstruction. Prior laparoscopic loop ileostomy related Ogilvie's disease. EXAM: CT ABDOMEN AND PELVIS WITH CONTRAST TECHNIQUE: Multidetector CT imaging of the abdomen and pelvis was performed using the standard protocol following bolus administration of intravenous contrast. RADIATION DOSE REDUCTION: This exam was performed according to the departmental dose-optimization program which includes automated exposure control, adjustment of the mA and/or kV according to patient size and/or use of iterative reconstruction technique. CONTRAST:  OMNIPAQUE  IOHEXOL  300 MG/ML  SOLN COMPARISON:  X-ray 04/02/2024 of the abdomen and older. CT 03/25/2024. FINDINGS: Lower chest: Persistent nodular bandlike opacities along the lung bases. Slightly increasing on the left. Similar on the right. Atelectasis versus infiltrate. Recommend follow-up. No pleural effusion. There is breathing motion. Coronary artery calcifications are identified. Enteric tube extends into the stomach. Slightly patulous lower esophagus. Slight elevation of the right hemidiaphragm. Hepatobiliary: Gallbladder has some slight increased density in the lumen. Sludge or small stones are in the differential. Patent portal vein. Tiny segment 3 low-attenuation liver lesion. Unchanged from previous but too small to completely characterize. Statistically likely a benign cystic lesion. No new space-occupying liver lesion at this time. Pancreas: Moderate global atrophy of the pancreas without mass. Spleen: Normal in size without focal abnormality. Adrenals/Urinary Tract: Adrenal glands are preserved. Moderate atrophy of the left kidney. Slight right. Stable 14 mm lower pole anterior right-sided renal cyst. No enhancing renal mass. Mild nonspecific perinephric stranding. No collecting system dilatation. The right ureter has a normal course and caliber. There is ectasia of the distal left ureter, unchanged from  previous. Again no delayed enhancement excretion from the left kidney. This could be a patulous for chronic process including a megaureter. Please correlate for any history of chronic reflux. Preserved contour to the urinary bladder. Stomach/Bowel: Large bowel is decompressed. Few left-sided colonic diverticula. Normal retrocecal appendix. Stomach is nondilated. Diverting right-sided ostomy at the level of the pelvis. There is of training mucous fistula along the terminal ileum which is decompressed. The small bowel is diffusely mildly dilated with air-fluid levels. There is some oral contrast administered in the stomach and majority of the small bowel but the contrast does not extend all the way to the ostomy. The dilatation has diameter approaching up to 4.1 cm. The dilatation extends to the level of the exiting loop at the ostomy. Please see coronal series 5, image 20. Vascular/Lymphatic: Normal caliber aorta and IVC with some atherosclerotic changes. Retroaortic left renal vein. No specific abnormal lymph node enlargement identified in the abdomen and pelvis. Reproductive: Enlarged prostate. There is a heterogeneous area of higher density along the peripheral zone of the right side of the prostate. Possibilities would include abnormal enhancement. Please correlate for the patient's PSA to assess for potential neoplasm. Other: No free intra-air.  No free fluid. Musculoskeletal: Curvature and degenerative changes along the spine. Degenerative changes along the pelvis. Focal lipoma along the left flank musculature on series 2, image 58. IMPRESSION: Diverting right-sided ileostomy. Two loops are brought out, 1 for the proximal ileum and 1 distal. The terminal ileum and colon are decompressed. There is persistent dilatation of the small bowel diffusely measuring up to 4.1 cm in diameter. Transition at  the level of the ostomy. Oral contrast was administered and is seen opacifying the stomach and majority of the small  bowel but the contrast has not yet reached the ostomy at the time of imaging. If needed delayed study could be obtained to see if the contrast reaches the ostomy. This could be done with an x-ray potentially. No free air or free fluid. Lung base parenchymal opacity identified slightly increasing on the left. Atelectasis favored over infiltrate but recommend follow-up. Question gallbladder sludge or stones. Heterogeneous density to the prostate particularly peripheral zone on the right side posteriorly. This could be abnormal enhancement. Please correlate for any history of known malignancy and correlate with the patient's PSA. Further workup when clinically appropriate. 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 Favor a chronic process. Electronically Signed   By: Adrianna Horde M.D.   On: 04/02/2024 17:48   DG ABD ACUTE 2+V W 1V CHEST Result Date: 04/02/2024 CLINICAL DATA:  [id number] Ileus following gastrointestinal surgery (HCC) [id number] EXAM: DG ABDOMEN ACUTE WITH 1 VIEW CHEST COMPARISON:  04/01/2024 FINDINGS: Mild elevation of right diaphragmatic leaflet as before. Right infrahilar opacities persist. Left lung clear. No definite pleural effusion. Gastric tube into the decompressed stomach. No free air. Multiple dilated mid abdominal small bowel loops, similar in number and degree of dilatation since previous. The colon appears decompressed. There are no abnormal calcifications. Regional bones unremarkable.  Left shoulder DJD. IMPRESSION: 1. Persistent small bowel dilatation, suggesting  obstruction. 2. Gastric tube into the decompressed stomach. 3. Persistent right infrahilar opacities. Electronically Signed   By: Nicoletta Barrier M.D.   On: 04/02/2024 12:59   DG ABD ACUTE 2+V W 1V CHEST Result Date: 04/01/2024 CLINICAL DATA:  Postoperative ileus. Twelve days postop status post robotic assisted laparoscopic loop ileostomy for  Ogilvie syndrome. EXAM: DG ABDOMEN ACUTE WITH 1 VIEW CHEST COMPARISON:  Radiographs 03/30/2024 and 03/29/2024.  CT 03/25/2024. FINDINGS: Enteric tube has been removed. Right arm PICC projects to the superior cavoatrial junction. Persistent low lung volumes with right greater than left basilar atelectasis. No significant pleural effusion or pneumothorax. The heart size and mediastinal contours are stable. New mild gastric distension. Moderate diffuse small bowel distension has not significantly changed with multiple air-fluid levels on the erect examination. The colon appears decompressed. No evidence of pneumoperitoneum or acute osseous abnormality. IMPRESSION: 1. New mild gastric distension following enteric tube removal. 2. No significant change in moderate diffuse small bowel distension with multiple air-fluid levels, most consistent with postoperative ileus status post recent ileostomy. If concern for bowel obstruction, consider follow-up CT with enteric contrast. 3. Persistent low lung volumes with right greater than left basilar atelectasis. Electronically Signed   By: Elmon Hagedorn M.D.   On: 04/01/2024 12:35   DG Abd 1 View Result Date: 04/01/2024 CLINICAL DATA:  NG tube placement. EXAM: ABDOMEN - 1 VIEW COMPARISON:  Earlier same day. FINDINGS: NG tube tip is in the mid stomach with proximal side port below the GE junction. Moderate gaseous distention of the stomach noted despite NG tube placement. Gas-filled dilated small bowel loops noted in the visualized upper abdomen, measuring up to 4.2 cm diameter. There is right base collapse/consolidation with small right pleural effusion. The tip of a right PICC line overlies the mid SVC level. IMPRESSION: 1. NG tube tip is in the mid stomach. 2. Moderate gaseous distention of the stomach despite NG tube placement. 3. Gas-filled dilated small  bowel loops in the visualized upper abdomen, similar to prior. Electronically Signed   By: Donnal Fusi M.D.   On:  04/01/2024 10:52   DG ABD ACUTE 2+V W 1V CHEST Result Date: 03/30/2024 CLINICAL DATA:  [id number] Ileus following gastrointestinal surgery (HCC) [id number] EXAM: DG ABDOMEN ACUTE WITH 1 VIEW CHEST COMPARISON:  the previous day's study FINDINGS: Improving bibasilar airspace opacities.  Right arm PICC stable. Gastric tube to the proximal duodenum, the stomach decompressed. There are few mildly dilated mid abdominal small bowel loops with fluid levels, decreased slightly in number and degree of dilatation since previous study. The colon remains decompressed. No free air. Mild lumbar spondylitic changes. IMPRESSION: 1. Improving bibasilar airspace disease. 2. Slightly improved small bowel dilatation. Electronically Signed   By: Nicoletta Barrier M.D.   On: 03/30/2024 08:46   DG ABD ACUTE 2+V W 1V CHEST Result Date: 03/29/2024 CLINICAL DATA:  [id number] Ileus following gastrointestinal surgery (HCC) [id number]. EXAM: DG ABDOMEN ACUTE WITH 1 VIEW CHEST COMPARISON:  03/28/2024. FINDINGS: There is gaseous dilatation of small bowel loops, which is disproportionate to the degree of distention of the colon, which may represent small bowel obstruction versus adynamic ileus. There is mild interval increase in the degree of dilation of bowel loops when compared to the recent prior exam. Small amount of stool noted in the ascending colon. Rest of the colon is collapsed. The No evidence of pneumoperitoneum. Low lung volume. Bilateral lung fields are clear. Bilateral costophrenic angles are clear. Normal cardio-mediastinal silhouette. No acute osseous abnormalities. The soft tissues are within normal limits. Surgical changes, devices, tubes and lines: Enteric tube seen with its tip and side hole overlying the right upper quadrant, overlying the pylorus/duodenal bulb region. IMPRESSION: 1. Mild interval increase in the degree of dilation of small bowel loops, which may represent worsening small bowel obstruction versus adynamic ileus. Correlate  clinically. 2. No active cardiopulmonary disease. Electronically Signed   By: Beula Brunswick M.D.   On: 03/29/2024 08:35   DG ABD ACUTE 2+V W 1V CHEST Result Date: 03/28/2024 CLINICAL DATA:  Ileus. EXAM: DG ABDOMEN ACUTE WITH 1 VIEW CHEST COMPARISON:  Radiograph dated 03/27/2024. FINDINGS: Enteric tube coiled in the proximal stomach with tip in the distal stomach. Persistent dilatation of small-bowel loops measure up to 3.6 cm. Similar positioning of right-sided PICC. Shallow inspiration with bibasilar atelectasis. No consolidative changes or pneumothorax. Stable cardiac silhouette. No acute osseous pathology. IMPRESSION: 1. Enteric tube with tip in the distal stomach. 2. Persistent small-bowel dilatation. Electronically Signed   By: Angus Bark M.D.   On: 03/28/2024 11:16   DG Abd 1 View Result Date: 03/27/2024 CLINICAL DATA:  NG placement. EXAM: ABDOMEN - 1 VIEW COMPARISON:  Radiograph dated 03/27/2024. FINDINGS: Enteric tube with tip in the region of the distal stomach. Dilated small bowel loops measure up to 3.6 cm. IMPRESSION: Enteric tube with tip in the distal stomach. Electronically Signed   By: Angus Bark M.D.   On: 03/27/2024 17:04   DG ABD ACUTE 2+V W 1V CHEST Result Date: 03/27/2024 CLINICAL DATA:  Ileus EXAM: DG ABDOMEN ACUTE WITH 1 VIEW CHEST COMPARISON:  03/21/2024 FINDINGS: Gaseous distention of the stomach and small bowel loops. Decompressed colon. Findings concerning for small bowel obstruction. No organomegaly or free air. Mild elevation of the right hemidiaphragm. No confluent opacities or effusions. Heart mediastinal contours within normal limits. IMPRESSION: Dilated stomach and small bowel concerning for small bowel obstruction. Electronically Signed   By: Janeece Mechanic M.D.  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 EXAM: CT  ABDOMEN AND PELVIS WITH CONTRAST TECHNIQUE: Multidetector CT imaging of the abdomen and pelvis was performed using the standard protocol following bolus administration of intravenous contrast. RADIATION DOSE REDUCTION: This exam was performed according to the departmental dose-optimization program which includes automated exposure control, adjustment of the mA and/or kV according to patient size and/or use of iterative reconstruction technique. CONTRAST:  OMNIPAQUE  IOHEXOL  300 MG/ML  SOLN COMPARISON:  CT 03/15/2024 FINDINGS: Lower chest: Peribronchial thickening and mild bronchiectasis lung bases. Evidence of active pneumonia. Mild basilar atelectasis on the RIGHT. Hepatobiliary: Several low-density lesions LEFT hepatic lobe too small to characterize. Subtle enhancing lesion posterior RIGHT hepatic lobe measuring 8 mm on image 20/2 cannot be fully characterize. Pancreas: Pancreas is normal. No ductal dilatation. No pancreatic inflammation. Spleen: Normal spleen Adrenals/urinary tract: Adrenal glands are normal. Cortical scarring of the LEFT kidney. Small simple fluid attenuation cyst of the RIGHT kidney measuring 14 mm on postcontrast imaging ureters and bladder normal. Stomach/Bowel: Fluid stomach is fluid-filled. Duodenum is normal. The small bowel is fluid-filled. The small bowel is mildly dilated to 3.3 cm (image 66/2). No evidence of high-grade obstruction. 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 The distal ileum and colon are collapsed. Vascular/Lymphatic: Abdominal aorta is normal caliber. No periportal or retroperitoneal adenopathy. No pelvic adenopathy. Reproductive: Enlarged prostate gland Other: No free fluid. Musculoskeletal: No aggressive osseous lesion. IMPRESSION: 1. 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 Small bowel proximal to the ostomy is mildly dilated and fluid-filled. Favor postoperative ileus over bowel obstruction. 2. The distal small bowel and colon are collapsed. 3. Moderate  volume of fluid within this stomach. 4. Basilar atelectasis. 5. Several small lesion liver too small to characterize but favored benign. Electronically Signed   By: Deboraha Fallow M.D.   On: 03/25/2024 15:22   DG Abd 1 View Result Date: 03/25/2024 CLINICAL DATA:  Abdominal distension. History of Ogilvie syndrome. Status post robotic assisted diagnostic laparoscopy on 03/20/2024. EXAM: ABDOMEN - 1 VIEW COMPARISON:  03/21/24 FINDINGS: Since the previous exam there is been interval development of multiple dilated small bowel loops which measure up to 3.9 cm. The colon appears decompressed. No signs of pneumoperitoneum. IMPRESSION: Interval development of multiple dilated small bowel loops. Imaging findings are concerning for either small bowel ileus versus obstruction. Electronically Signed   By: Kimberley Penman M.D.   On: 03/25/2024 07:48   DG ABD ACUTE 2+V W 1V CHEST Result Date: 03/21/2024 CLINICAL DATA:  Ogilvie syndrome. EXAM: DG ABDOMEN ACUTE WITH 1 VIEW CHEST COMPARISON:  Abdominal radiograph dated 03/20/2024. FINDINGS: Evaluation is limited due to body habitus. Right-sided PICC in similar position. Mild eventration of the right hemidiaphragm. Small right pleural effusion and right lung base atelectasis. No pneumothorax. Stable cardiomediastinal silhouette. Mild air distention of the stomach. No bowel dilatation or evidence of obstruction. Contrast noted throughout the colon. No free air. The osseous structures are intact. The soft tissues are unremarkable. IMPRESSION: 1. Small right pleural effusion and right lung base atelectasis. 2. Nonobstructive bowel gas pattern. Electronically Signed   By: Angus Bark M.D.   On: 03/21/2024 12:10   DG ABD ACUTE 2+V W 1V CHEST Result Date: 03/20/2024 CLINICAL DATA:  Over the syndrome. EXAM: DG ABDOMEN ACUTE WITH 1 VIEW CHEST COMPARISON:  03/19/2024 FINDINGS: Asymmetric elevation right hemidiaphragm. The cardio pericardial silhouette  is enlarged. There is pulmonary  vascular congestion without overt pulmonary edema. Right PICC line tip overlies the lower SVC level. Upright film shows no evidence for intraperitoneal free air. Interval decrease in the marked gaseous distention of the stomach. The gaseous dilatation of the right and transverse colon has also decreased slightly in the interval since the previous study. No gaseous small bowel dilatation on the current study to suggest small bowel obstruction. IMPRESSION: 1. Interval decrease in the marked gaseous distention of the stomach. 2. Interval decrease in the gaseous dilatation of the right and transverse colon. 3. No evidence for intraperitoneal free air. 4. Enlargement of the cardiopericardial silhouette with pulmonary vascular congestion. Electronically Signed   By: Donnal Fusi M.D.   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 Evidence for gas within small and large bowel. Gaseous distension in right lower quadrant is likely associated with the right colon. There is gas in the rectum. No evidence for free air on the upright view. Again noted is mild elevation the right hemidiaphragm. Prominent central vascular markings and concern for at least mild pulmonary edema. Right arm PICC line terminates in the lower SVC region. Heart size is within normal limits and stable. Degenerative changes in the right shoulder. Consolidation or atelectasis at the right lung base. IMPRESSION: 1. Gaseous distension of the stomach and right colon. Findings are nonspecific but could be related to ileus. 2. Prominent central vascular markings and concern for mild pulmonary edema. 3. Right basilar chest densities. Electronically Signed   By: Elene Griffes M.D.   On: 03/19/2024 11:39   US  EKG SITE RITE Result Date: 03/18/2024 If Site Rite image not attached,  placement could not be confirmed due to current cardiac rhythm.  DG Abd 1 View Result Date: 03/17/2024 CLINICAL DATA:  Ogilvie syndrome. EXAM: ABDOMEN - 1 VIEW COMPARISON:  Same day. FINDINGS: Colonic dilatation noted on prior exam appears to have resolved. Residual contrast is noted in descending colon and rectum. IMPRESSION: No abnormal bowel dilatation is noted currently. Electronically Signed   By: Rosalene Colon M.D.   On: 03/17/2024 13:42   DG ABD ACUTE 2+V W 1V CHEST Result Date: 03/17/2024 CLINICAL DATA:  Ogilvie syndrome. EXAM: DG ABDOMEN ACUTE WITH 1 VIEW CHEST COMPARISON:  03/16/2024 FINDINGS: Gaseous distension of the colon is again noted. This appears similar to the previous exam. Right lower quadrant: Measures 12 cm in diameter compared with the same previously. Enteric contrast material is identified within the rectum. Stable cardiomediastinal contours. Low lung volumes with asymmetric elevation of the right hemidiaphragm. Bibasilar atelectasis noted. No airspace consolidation. IMPRESSION: 1. Persistent gaseous distension of the colon. Unchanged compared with the prior exam. 2. Low lung volumes with bibasilar atelectasis. Electronically Signed   By: Kimberley Penman M.D.   On: 03/17/2024 09:59   DG Abd 1 View Result Date: 03/16/2024 CLINICAL DATA:  Ogilvie syndrome EXAM: ABDOMEN - 1 VIEW COMPARISON:  03/15/2024 FINDINGS: Gaseous distension of the colon slightly improved since prior study. Maximum diameter of the cecum/ascending colon 12.9 cm compared to 15 cm previously. No small bowel dilatation. No organomegaly or free air. IMPRESSION: Moderate gaseous distention of the colon with some improvement since prior study. Electronically Signed   By: Janeece Mechanic M.D.   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  Abdominal distension. EXAM: DG ABDOMEN ACUTE WITH 1 VIEW CHEST COMPARISON:  03/15/2024 FINDINGS: Diffuse gaseous  distension of the colon is again noted. Right lower quadrant bowel loop measures 14.5 cm in diameter, unchanged from previous exam. Rectal contrast material is identified as noted previously. No signs of free air. Stable cardiomediastinal contours. Low lung volumes. Persistent bibasilar pulmonary opacities, right greater than left. IMPRESSION: 1. Persistent diffuse gaseous distension of the colon. Similar to previous exam. 2. Persistent bibasilar pulmonary opacities, right greater than left. Electronically Signed   By: Kimberley Penman M.D.   On: 03/16/2024 10:53   CT ABDOMEN PELVIS W CONTRAST Result Date: 03/15/2024 CLINICAL DATA:  Ogilvie syndrome.  Abdominal distension. EXAM: CT ABDOMEN AND PELVIS WITH CONTRAST TECHNIQUE: Multidetector CT imaging of the abdomen and pelvis was performed using the standard protocol following bolus administration of intravenous contrast. RADIATION DOSE REDUCTION: This exam was performed according to the departmental dose-optimization program which includes automated exposure control, adjustment of the mA and/or kV according to patient size and/or use of iterative reconstruction technique. CONTRAST:  OMNIPAQUE  IOHEXOL  300 MG/ML  SOLN COMPARISON:  03/12/2024.  Plain films earlier today. FINDINGS: Lower chest: Atelectasis in the lower lobes bilaterally, stable since prior study. No effusions. Coronary artery and aortic atherosclerosis. Hepatobiliary: No focal hepatic abnormality. Gallbladder unremarkable. Pancreas: No focal abnormality or ductal dilatation. Spleen: No focal abnormality.  Normal size. Adrenals/Urinary Tract: Adrenal glands normal. Areas of scarring in the left kidney. No suspicious renal abnormality, stones or hydronephrosis. Urinary bladder unremarkable. Stomach/Bowel: Gaseous distension of the right colon and transverse colon and descending colon. Rectosigmoid colon are decompressed. Findings are stable when compared to prior CT and plain films. Stomach and  small bowel decompressed, unremarkable. Vascular/Lymphatic: Aortic atherosclerosis. No evidence of aneurysm or adenopathy. Reproductive: Prostate enlargement Other: No free fluid or free air. Musculoskeletal: No acute bony abnormality. IMPRESSION: Gaseous distention of the colon to the level of the sigmoid colon where there is gradual decompression. Findings most compatible with ileus and are unchanged since prior CT and plain films. Coronary artery disease, aortic atherosclerosis. Prostate enlargement. Stable bibasilar atelectasis. Electronically Signed   By: Janeece Mechanic M.D.   On: 03/15/2024 17:09   DG Abd 1 View Result Date: 03/15/2024 CLINICAL DATA:  [id number] Abdominal distention [id number] Abdominal pain [id number] EXAM: ABDOMEN - 1 VIEW COMPARISON:  03/13/2024 FINDINGS: Diffuse gaseous distension of the colon, increased compared to the prior film. No dilated loops of small bowel are seen. Enteric contrast has progressed to the level of the rectum. No gross free intraperitoneal air on supine imaging. IMPRESSION: Diffuse gaseous distension of the colon, increased compared to the prior film. No dilated loops of small bowel are seen. Electronically Signed   By: Leverne Reading D.O.   On: 03/15/2024 13:07    Labs: Basic Metabolic Panel: Recent Labs  Lab 04/08/24 0446 04/12/24 0028  NA 133* 130*  K 4.5 4.4  CL 103 101  CO2 22 20*  GLUCOSE 121* 163*  BUN 29* 29*  CREATININE 0.78 1.12  CALCIUM  8.9 8.7*  MG 1.9  --   PHOS 4.0  --    CBC: Recent Labs  Lab 04/11/24 2349  WBC 19.8*  NEUTROABS 17.4*  HGB 12.6*  HCT 37.6*  MCV 97.4  PLT 299   Microbiology: Results for orders placed or performed during the hospital encounter of 04/11/24  Blood Culture (routine x 2)  Status: None (Preliminary result)   Collection Time: 04/11/24 11:49 PM   Specimen: BLOOD  Result Value Ref Range Status   Specimen Description BLOOD RIGHT HAND  Final   Special Requests   Final    BOTTLES DRAWN AEROBIC  AND ANAEROBIC Blood Culture results may not be optimal due to an inadequate volume of blood received in culture bottles   Culture   Final    NO GROWTH < 12 HOURS Performed at [facility], [address], [place], [place] [address]    Report Status PENDING  Incomplete  Blood Culture (routine x 2)     Status: None (Preliminary result)   Collection Time: 04/11/24 11:49 PM   Specimen: BLOOD  Result Value Ref Range Status   Specimen Description BLOOD LEFT FA  Final   Special Requests   Final    BOTTLES DRAWN AEROBIC AND ANAEROBIC Blood Culture results may not be optimal due to an inadequate volume of blood received in culture bottles   Culture   Final    NO GROWTH < 12 HOURS Performed at [facility], [address] [place], [place] [address]    Report Status PENDING  Incomplete  Gastrointestinal Panel by PCR , Stool     Status: None   Collection Time: 04/11/24 11:49 PM   Specimen: Stool  Result Value Ref Range Status   Campylobacter species NOT DETECTED NOT DETECTED Final   Plesimonas shigelloides NOT DETECTED NOT DETECTED Final   Salmonella species NOT DETECTED NOT DETECTED Final   Yersinia enterocolitica NOT DETECTED NOT DETECTED Final   Vibrio species NOT DETECTED NOT DETECTED Final   Vibrio cholerae NOT DETECTED NOT DETECTED Final   Enteroaggregative E coli (EAEC) NOT DETECTED NOT DETECTED Final   Enteropathogenic E coli (EPEC) NOT DETECTED NOT DETECTED Final   Enterotoxigenic E coli (ETEC) NOT DETECTED NOT DETECTED Final   Shiga like toxin producing E coli (STEC) NOT DETECTED NOT DETECTED Final   Shigella/Enteroinvasive E coli (EIEC) NOT DETECTED NOT DETECTED Final   Cryptosporidium NOT DETECTED NOT DETECTED Final   Cyclospora cayetanensis NOT DETECTED NOT DETECTED Final   Entamoeba histolytica NOT DETECTED NOT DETECTED Final   Giardia lamblia NOT DETECTED NOT DETECTED Final   Adenovirus F40/41 NOT DETECTED NOT DETECTED Final   Astrovirus NOT DETECTED NOT  DETECTED Final   Norovirus GI/GII NOT DETECTED NOT DETECTED Final   Rotavirus A NOT DETECTED NOT DETECTED Final   Sapovirus (I, II, IV, and V) NOT DETECTED NOT DETECTED Final    Comment: Performed at [facility], [address] [place], [place] [address]  C Difficile Quick Screen w PCR reflex     Status: None   Collection Time: 04/11/24 11:49 PM   Specimen: Stool  Result Value Ref Range Status   C Diff antigen NEGATIVE NEGATIVE Final   C Diff toxin NEGATIVE NEGATIVE Final   C Diff interpretation No C. difficile detected.  Final    Comment: Performed at [facility], [address] [place], [place] [address]  Resp panel by RT-PCR (RSV, Flu A&B, Covid) Anterior Nasal Swab     Status: None   Collection Time: 04/11/24 11:49 PM   Specimen: Anterior Nasal Swab  Result Value Ref Range Status   SARS Coronavirus 2 by RT PCR NEGATIVE NEGATIVE Final    Comment: (NOTE) SARS-CoV-2 target nucleic acids are NOT DETECTED.  The SARS-CoV-2 RNA is generally detectable in upper respiratory specimens during the acute phase of infection. The lowest  concentration of SARS-CoV-2 viral copies this assay can detect is 138 copies/mL. A negative result does not preclude SARS-Cov-2 infection and should not be used as the sole basis for treatment or other patient management decisions. A negative result may occur with  improper specimen collection/handling, submission of specimen other than nasopharyngeal swab, presence of viral mutation(s) within the areas targeted by this assay, and inadequate number of viral copies(<138 copies/mL). A negative result must be combined with clinical observations, patient history, and epidemiological information. The expected result is Negative.  Fact Sheet for Patients:  BloggerCourse.com  Fact Sheet for Healthcare Providers:  SeriousBroker.it  This test is no t yet approved or cleared by the [place] FDA and  has been authorized for detection and/or diagnosis of SARS-CoV-2 by FDA under an Emergency Use Authorization (EUA). This EUA will remain  in effect (meaning this test can be used) for the duration of the COVID-19 declaration under Section 564(b)(1) of the Act, 21 U.S.C.section 360bbb-3(b)(1), unless the authorization is terminated  or revoked sooner.       Influenza A by PCR NEGATIVE NEGATIVE Final   Influenza B by PCR NEGATIVE NEGATIVE Final    Comment: (NOTE) The Xpert Xpress SARS-CoV-2/FLU/RSV plus assay is intended as an aid in the diagnosis of influenza from Nasopharyngeal swab specimens and should not be used as a sole basis for treatment. Nasal washings and aspirates are unacceptable for Xpert Xpress SARS-CoV-2/FLU/RSV testing.  Fact Sheet for Patients: BloggerCourse.com  Fact Sheet for Healthcare Providers: SeriousBroker.it  This test is not yet approved or cleared by the [place]  FDA and has been authorized for detection and/or diagnosis of SARS-CoV-2 by FDA under an Emergency Use Authorization (EUA). This EUA will remain in effect (meaning this test can be used) for the duration of the COVID-19 declaration under Section 564(b)(1) of the Act, 21 U.S.C. section 360bbb-3(b)(1), unless the authorization is terminated or revoked.     Resp Syncytial Virus by PCR NEGATIVE NEGATIVE Final    Comment: (NOTE) Fact Sheet for Patients: BloggerCourse.com  Fact Sheet for Healthcare Providers: SeriousBroker.it  This test is not yet approved or cleared by the [place]  FDA and has been authorized for detection and/or diagnosis of SARS-CoV-2 by FDA under an Emergency Use Authorization (EUA). This EUA will remain in effect (meaning this test can be used) for the duration of the COVID-19 declaration under Section 564(b)(1) of the Act, 21 U.S.C. section  360bbb-3(b)(1), unless the authorization is terminated or revoked.  Performed at [facility], [address], [place], [place] [address]     Time coordinating discharge: Over 30 minutes  Ree Candy, MD  [facility] 04/12/2024, 3:20 PM

## 2024-04-12 NOTE — Assessment & Plan Note (Signed)
 Continue lisinopril .  Hold amlodipine  in the setting of sepsis and resume it blood pressure remained stable

## 2024-04-12 NOTE — TOC Initial Note (Addendum)
 Transition of Care [facility]) - Initial/Assessment Note    Patient Details  Name: Todd Rojas MRN: [id number] Date of Birth: Feb 17, 1941  [facility] CM/SW Contact:    Odilia Bennett, LCSW Phone Number: 04/12/2024, 10:18 AM  Clinical Narrative:   Patient discharged home with home health on 4/22. He was set up with [facility] for PT, OT, RN. Liaison confirmed that PT and RN have already started services. No further concerns. CSW will continue to follow patient for support and facilitate return home once stable.               1:15 pm: CSW met with patient and family per RN request. Daughter asked about possibility of transfer to [facility]. CSW sent secure chat to MD to notify.  Expected Discharge Plan: Home w Home Health Services Barriers to Discharge: Continued Medical Work up   Patient Goals and CMS Choice            Expected Discharge Plan and Services       Living arrangements for the past 2 months: Single Family Home                           HH Arranged: RN, PT, OT [facility] Agency: [facility] Date [facility] Agency Contacted: 04/12/24   Representative spoke with at [facility] Agency: [place]   Prior Living Arrangements/Services Living arrangements for the past 2 months: Single Family Home Lives with:: Spouse Patient language and need for interpreter reviewed:: Yes        Need for Family Participation in Patient Care: Yes (Comment) Care giver support system in place?: Yes (comment) Current home services: Home OT, Home PT, Home RN, DME Criminal Activity/Legal Involvement Pertinent to Current Situation/Hospitalization: No - Comment as needed  Activities of Daily Living      Permission Sought/Granted                  Emotional Assessment       Orientation: : Oriented to Self, Oriented to Place, Oriented to  Time, Oriented to Situation Alcohol  / Substance Use: Not Applicable Psych Involvement: No (comment)  Admission diagnosis:  SIRS  (systemic inflammatory response syndrome) (HCC) [R65.10] Sepsis, unspecified organism (HCC) [A41.9] Patient Active Problem List   Diagnosis Date Noted   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 04/12/2024   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) 04/12/2024   Increased ileostomy output (HCC) 04/12/2024   Sepsis, unspecified organism (HCC) 04/12/2024   Protein-calorie malnutrition, severe 03/27/2024   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   Gaseous abdominal distention 03/13/2024   Thyroid  nodule greater than or equal to 1.5 cm in diameter incidentally noted on imaging study 03/10/2024   Acute hypoxic respiratory failure (HCC) 03/10/2024   Severe sepsis with acute organ dysfunction (HCC) 03/09/2024   Elevated serum creatinine 03/09/2024   Elevated troponin 03/09/2024   Fever 03/09/2024   Sepsis (HCC) 08/16/2017   Acute renal failure with tubular necrosis (HCC)    Gross hematuria    COPD (chronic obstructive pulmonary disease) (HCC) 07/03/2017   Hyperlipidemia, unspecified 07/03/2017   Personal history of gout 12/09/2016   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 [place] [address] Phone: [phone] Fax: [phone]     Social Drivers of Health (SDOH) Social History: SDOH Screenings   Food Insecurity: No Food Insecurity (03/10/2024)  Housing: Low Risk  (03/10/2024)  Transportation Needs: No Transportation Needs (03/10/2024)  Utilities: Not At  Risk (03/10/2024)  Depression (PHQ2-9): Low Risk  (07/12/2021)  Financial Resource Strain: Low Risk  (10/23/2023)   Received from [facility]  Social Connections: Socially Integrated (03/10/2024)  Tobacco Use: Medium Risk (04/11/2024)   SDOH Interventions:     Readmission Risk Interventions     No data to display

## 2024-04-12 NOTE — Assessment & Plan Note (Addendum)
 Outpatient workup, PSA correlation recommended by radiologist

## 2024-04-12 NOTE — H&P (Addendum)
 History and Physical    Patient: Todd Rojas ZOX:[id number] DOB: Sep 16, 1941 DOA: 04/11/2024 DOS: the patient was seen and examined on 04/12/2024 PCP: Antonio Baumgarten, MD  Patient coming from: Home  Chief Complaint:  Chief Complaint  Patient presents with   Fever    HPI: Todd Rojas is a 83 y.o. 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  He reports that his ileostomy output is very liquid whereas previously it was more solid.  Also has left mid back pain that started in the past 24 hours.  He denies dysuria, chest pain or shortness of breath ED course and data review: Tmax 99 and tachycardic to 101 tachypneic to the low 20s BP 122/65 Labs notable for the following:  WBC 19,800 With lactic acid 1.9-->1.4 and procalcitonin less than 0.10 Respiratory viral panel negative GI panel and stool for C. difficile negative Urinalysis unremarkable CMP notable for hyponatremia of 130 EKG, personally reviewed and interpreted with sinus at 95 with no acute ST-T wave changes Chest x-ray with no acute cardiopulmonary disease CT abdomen and pelvis with contrast concerning for possible infiltrate among other findings as follows: IMPRESSION: 1. Stable moderate to marked severity posterior bibasilar atelectasis and/or infiltrate. 2. 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 3. Prostatomegaly with an area of increased parenchymal attenuation which may represent abnormal  parenchymal enhancement. Correlation with PSA levels is recommended.    Patient started on sepsis fluids and broad-spectrum antibiotics for sepsis of unknown source, cefepime , vancomycin  and Flagyl   Hospitalist consulted for admission.   Review of Systems: As mentioned in the history of present illness. All other systems reviewed and are negative.  Past Medical History:  Diagnosis Date   Actinic keratosis    Arthritis    Asthma    Basal cell carcinoma 03/17/2020   right ant lat deltoid   CAD (coronary artery disease) 02/13/2012   Overview:   12/2004 - Anginal equivalent symptoms:  Left arm pain, weakness, and shortness of breath.   12/2004:  Anterior STEMI.  Transferred to [facility].   01/06/2005 - Cardiac cath:  EF 51%, an occluded mid LAD and a large OM2 with 75% ostial stenosis.  PCI of the mid occluded LAD using a Cypher stent.   03/2005 - 2D echo:  EF >55%, LAE 4.7 cm, no significant valvular disease.   02/2008:  Plavix stopped.   06/2008:  Recurrent angina - symptoms of chest tightness.  Presented to [facility].  Cardiac catheterization.  The patient stated nonobstructive CAD seen.  Plavix restarted at that time.   03/2009 - 2D echo:  EF 35%, akinetic apex and septum.  Stress echocardiogram deferred for cardiac catheterization.   03/30/2009:  PCI 90% proximal LAD with a 3.0 x 18 mm Xience DES.   02/2010: Slow and gradual return of chest tightness, fatigue and generalized weakness concerning for return of anginal equivalent.  Stress test did not show ischemia to the heart muscle during exercise.  Evidenc   CHF (congestive heart failure) (HCC)    Chronic kidney disease    kidney stone  left side   COPD (chronic obstructive pulmonary disease) (HCC) 07/03/2017   Dyslipidemia 02/13/2012   Overview:   03/2009  TC 158, TG 88, HDL 39, LDL 102.  11/2013  TC 148, TG 115, HDL 44.3, LDL 80.7 (done at PCP's office)   10/2014 TC 146  11/2015 TC 123, TG 86, HDL 41.7, LDL 64  11/2016  TC 140, TG 129, HDL 40.1, LDL 74    Dyspnea    GERD (gastroesophageal reflux disease)    GI disease 03/09/2013   Overview:   Dysphagia - EGD demonstrated esophageal stricture which was dilated   GERD   Hemorrhoids    Hearing loss 03/09/2013   History of kidney stones    Hyperlipidemia, unspecified 07/03/2017   Hypertension 02/13/2012   Myocardial infarction [facility]) 2004   Personal history of gout 12/09/2016   Primary osteoarthritis of both knees 05/05/2015   Right-sided low back pain without sciatica 04/14/2016   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   R ear - ED&C   Past Surgical History:  Procedure Laterality Date   colonoscopy with polypectomy     COLONOSCOPY WITH PROPOFOL  N/A 10/08/2018   Procedure: COLONOSCOPY WITH PROPOFOL ;  Surgeon: Cassie Click, MD;  Location: [facility] ENDOSCOPY;  Service: Endoscopy;  Laterality: N/A;   COLONOSCOPY WITH PROPOFOL  N/A 01/06/2023   Procedure: COLONOSCOPY WITH PROPOFOL ;  Surgeon: Shane Darling, MD;  Location: [facility] ENDOSCOPY;  Service: Endoscopy;  Laterality: N/A;   coronary stents     CYSTOSCOPY WITH STENT PLACEMENT Left 08/09/2017   Procedure: CYSTOSCOPY WITH STENT PLACEMENT;  Surgeon: Bart Born, MD;  Location: [facility] ORS;  Service: Urology;  Laterality: Left;   FLEXIBLE SIGMOIDOSCOPY N/A 03/17/2024   Procedure: SIGMOIDOSCOPY, FLEXIBLE;  Surgeon: Toledo, Alphonsus Jeans, MD;  Location: [facility] ENDOSCOPY;  Service: Gastroenterology;  Laterality: N/A;   MEDIAL PARTIAL KNEE REPLACEMENT Bilateral 2016   URETEROSCOPY WITH HOLMIUM LASER LITHOTRIPSY Left 08/09/2017   Procedure: URETEROSCOPY WITH HOLMIUM LASER LITHOTRIPSY/INCISION OF URETEROCELE;  Surgeon: Bart Born, MD;  Location: [facility] ORS;  Service: Urology;  Laterality: Left;   XI ROBOT ASSISTED DIAGNOSTIC LAPAROSCOPY N/A 03/20/2024   Procedure: LAPAROSCOPY, DIAGNOSTIC, ROBOT-ASSISTED;  Surgeon: Alben Alma, MD;  Location: [facility] ORS;  Service: General;  Laterality: N/A;   Social History:   reports that he has quit smoking. He quit smokeless tobacco use about 29 years ago.  His smokeless tobacco use included chew. He reports that he does not drink alcohol  and does not use drugs.  No Known Allergies  Family History  Problem Relation Age of Onset   Alzheimer's disease Mother    Leukemia Father    Prostate cancer Neg Hx    Kidney cancer Neg Hx    Bladder Cancer Neg Hx     Prior to Admission medications   Medication Sig Start Date End Date Taking? Authorizing Provider  acetaminophen  (TYLENOL ) 500 MG tablet Take 1,000 mg by mouth every 8 (eight) hours as needed for mild pain.    [provider]  albuterol  (VENTOLIN  HFA) 108 (90 Base) MCG/ACT inhaler Inhale 1 puff into the lungs every 6 (six) hours as needed for wheezing or shortness of breath. 04/09/24   Darus Engels A, DO  allopurinol  (ZYLOPRIM ) 300 MG tablet Take 300 mg by mouth daily.  01/31/17   [provider]  amLODipine  (NORVASC ) 5 MG tablet Take 1 tablet (5 mg total) by mouth daily. 04/10/24   Montey Apa, DO  aspirin  EC 81 MG tablet Take 81 mg  by mouth daily.    [provider]  baclofen  (LIORESAL ) 10 MG tablet Take 1 tablet (10 mg total) by mouth 3 (three) times daily as needed for muscle spasms. 04/09/24 05/09/24  Darus Engels A, DO  budesonide -formoterol  (SYMBICORT ) 160-4.5 MCG/ACT inhaler Inhale 2 puffs into the lungs 2 (two) times daily. 04/09/24 02/25/27  Darus Engels A, DO  colchicine 0.6 MG tablet Take 0.6 mg by mouth daily.    [provider]  diclofenac  Sodium (VOLTAREN ) 1 % GEL Apply 2 g topically 3 (three) times daily. 04/09/24   Montey Apa, DO  furosemide  (LASIX ) 20 MG tablet Take 20 mg by mouth daily.    [provider]  lisinopril  (ZESTRIL ) 20 MG tablet Take 20 mg by mouth daily. 02/15/21   [provider]  melatonin 10 MG TABS Take 5-10 mg by mouth at bedtime. 04/09/24   Montey Apa, DO  Multiple Vitamin (MULTIVITAMIN WITH MINERALS)  TABS tablet Take 1 tablet by mouth daily. 04/10/24   Darus Engels A, DO  nitroGLYCERIN  (NITROSTAT ) 0.4 MG SL tablet Place under the tongue. 03/31/20   [provider]  ondansetron  (ZOFRAN -ODT) 4 MG disintegrating tablet Take 1 tablet (4 mg total) by mouth every 8 (eight) hours as needed for nausea or vomiting. 04/09/24   Darus Engels A, DO  pantoprazole  (PROTONIX ) 40 MG tablet Take 1 tablet (40 mg total) by mouth daily. 04/10/24   Darus Engels A, DO  rosuvastatin  (CRESTOR ) 20 MG tablet Take 20 mg by mouth daily.    [provider]  VANISHPOINT TUBERCULIN SYRINGE 25G X 1" 1 ML MISC  07/05/17   [provider]  vitamin B-12 (CYANOCOBALAMIN ) 1000 MCG tablet Take 1,000 mcg by mouth daily.    [provider]    Physical Exam: Vitals:   04/12/24 0030 04/12/24 0130 04/12/24 0152 04/12/24 0200  BP: 112/65 104/60  115/82  Pulse: 87 85  90  Resp: 10 (!) 25  (!) 23  Temp:   98.7 F (37.1 C)   TempSrc:   Oral   SpO2: 94% 93%  94%  Weight:      Height:       Physical Exam Vitals and nursing note reviewed.  Constitutional:      General: He is not in acute distress.    Comments: Chronically ill-appearing male, conversational dyspnea  HENT:     Head: Normocephalic and atraumatic.  Cardiovascular:     Rate and Rhythm: Normal rate and regular rhythm.     Heart sounds: Normal heart sounds.  Pulmonary:     Effort: Tachypnea present.     Breath sounds: Normal breath sounds.  Abdominal:     General: There is distension.     Palpations: Abdomen is soft.     Tenderness: There is no abdominal tenderness. There is no right CVA tenderness or left CVA tenderness.     Comments: Liquid ileostomy output, brown in color.  Bag about two thirds full  Neurological:     Mental Status: Mental status is at baseline.     Labs on Admission: I have personally reviewed following labs and imaging studies  CBC: Recent Labs  Lab 04/11/24 2349  WBC 19.8*  NEUTROABS 17.4*   HGB 12.6*  HCT 37.6*  MCV 97.4  PLT 299   Basic Metabolic Panel: Recent Labs  Lab 04/08/24 0446 04/12/24 0028  NA 133* 130*  K 4.5 4.4  CL 103 101  CO2 22 20*  GLUCOSE 121* 163*  BUN 29* 29*  CREATININE 0.78 1.12  CALCIUM  8.9 8.7*  MG 1.9  --   PHOS 4.0  --    GFR: Estimated Creatinine Clearance: 50 mL/min (by C-G formula based on SCr of 1.12 mg/dL). Liver Function Tests: Recent Labs  Lab 04/08/24 0446 04/12/24 0028  AST 19 21  ALT 28 27  ALKPHOS 89 98  BILITOT 0.6 0.7  PROT 6.5 7.4  ALBUMIN  2.9* 3.1*   No results for input(s): "LIPASE", "AMYLASE" in the last 168 hours. No results for input(s): "AMMONIA" in the last 168 hours. Coagulation Profile: Recent Labs  Lab 04/11/24 2349  INR 1.1   Cardiac Enzymes: No results for input(s): "CKTOTAL", "CKMB", "CKMBINDEX", "TROPONINI" in the last 168 hours. BNP (last 3 results) No results for input(s): "PROBNP" in the last 8760 hours. HbA1C: No results for input(s): "HGBA1C" in the last 72 hours. CBG: Recent Labs  Lab 04/07/24 1638 04/07/24 2309 04/08/24 0512 04/08/24 1803 04/08/24 2346  GLUCAP 100* 123* 91 114* 118*   Lipid Profile: No results for input(s): "CHOL", "HDL", "LDLCALC", "TRIG", "CHOLHDL", "LDLDIRECT" in the last 72 hours. Thyroid  Function Tests: No results for input(s): "TSH", "T4TOTAL", "FREET4", "T3FREE", "THYROIDAB" in the last 72 hours. Anemia Panel: No results for input(s): "VITAMINB12", "FOLATE", "FERRITIN", "TIBC", "IRON", "RETICCTPCT" in the last 72 hours. Urine analysis:    Component Value Date/Time   COLORURINE YELLOW (A) 04/12/2024 0215   APPEARANCEUR HAZY (A) 04/12/2024 0215   APPEARANCEUR Cloudy (A) 08/23/2017 1018   LABSPEC 1.040 (H) 04/12/2024 0215   PHURINE 5.0 04/12/2024 0215   GLUCOSEU NEGATIVE 04/12/2024 0215   HGBUR NEGATIVE 04/12/2024 0215   BILIRUBINUR NEGATIVE 04/12/2024 0215   BILIRUBINUR Negative 08/23/2017 1018   KETONESUR NEGATIVE 04/12/2024 0215   PROTEINUR  NEGATIVE 04/12/2024 0215   NITRITE NEGATIVE 04/12/2024 0215   LEUKOCYTESUR NEGATIVE 04/12/2024 0215    Radiological Exams on Admission: CT ABDOMEN PELVIS W CONTRAST Result Date: 04/12/2024 CLINICAL DATA:  Recent abdominal surgery presenting with fever, leukocytosis and vomiting. EXAM: CT ABDOMEN AND PELVIS WITH CONTRAST TECHNIQUE: Multidetector CT imaging of the abdomen and pelvis was performed using the standard protocol following bolus administration of intravenous contrast. RADIATION DOSE REDUCTION: This exam was performed according to the departmental dose-optimization program which includes automated exposure control, adjustment of the mA and/or kV according to patient size and/or use of iterative reconstruction technique. CONTRAST:  OMNIPAQUE  IOHEXOL  300 MG/ML  SOLN COMPARISON:  April 02, 2024 FINDINGS: Lower chest: Stable moderate to marked severity areas of atelectasis and/or infiltrate are seen within the posterior aspects of the bilateral lung bases. Hepatobiliary: A stable 3 mm focus of parenchymal low attenuation is seen within the anterior aspect of the left lobe of the liver (axial CT image 25, CT series 2). No gallstones, gallbladder wall thickening, or biliary dilatation. Pancreas: Unremarkable. No pancreatic ductal dilatation or surrounding inflammatory changes. Spleen: Normal in size without focal abnormality. Adrenals/Urinary Tract: Adrenal glands are unremarkable. Kidneys are normal in size, without renal calculi or hydronephrosis. A stable simple cyst is seen within the right kidney. Areas of focal cortical scarring are noted within the anterolateral aspect of the left kidney. There is moderate severity dilatation of a short segment of the distal left ureter which extends to the level of the left UVJ. Bladder is unremarkable. Stomach/Bowel: The enteric tube seen on the prior study has been removed. There is a very small hiatal hernia. Appendix appears normal. No evidence of bowel  wall thickening, distention, or inflammatory changes. Noninflamed  diverticula are seen scattered throughout the sigmoid colon. Vascular/Lymphatic: Aortic atherosclerosis. No enlarged abdominal or pelvic lymph nodes. Reproductive: There is moderate to marked severity prostate gland enlargement. A stable 2.3 cm x 1.8 cm x 2.8 cm area of increased attenuation (approximately 124.31 Hounsfield units) is seen within the posterolateral aspect of the prostate gland on the right (axial CT image 95, CT series 2) Other: A right pelvic ostomy site is again seen. Small, stable bilateral fat containing inguinal hernias are also noted. No abdominopelvic ascites. Musculoskeletal: Multilevel degenerative changes are seen throughout the lumbar spine. IMPRESSION: 1. Stable moderate to marked severity posterior bibasilar atelectasis and/or infiltrate. 2. 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 3. Prostatomegaly with an area of increased parenchymal attenuation which may represent abnormal parenchymal enhancement. Correlation with PSA levels is recommended. Electronically Signed   By: Virgle Grime M.D.   On: 04/12/2024 01:43   DG Chest 2 View Result Date: 04/12/2024 CLINICAL DATA:  Possible sepsis EXAM: CHEST - 2 VIEW COMPARISON:  04/05/2024 FINDINGS: Elevation of the right diaphragm with atelectasis or scar at the right base. No focal consolidation, pleural effusion or pneumothorax. Stable cardiomediastinal silhouette. IMPRESSION: No active cardiopulmonary disease. Elevation of the right diaphragm with atelectasis or scar at the right base. Electronically Signed   By: Esmeralda Hedge M.D.   On: 04/12/2024 00:20     Data Reviewed: Relevant notes from primary care and specialist visits, past discharge summaries as available in EHR, including Care Everywhere. Prior diagnostic testing as pertinent to current admission diagnoses Updated medications and problem lists for reconciliation ED course,  including vitals, labs, imaging, treatment and response to treatment Triage notes, nursing and pharmacy notes and ED provider's notes Notable results as noted in HPI   Assessment and Plan: * Sepsis (HCC) Source uncertain-possible GI in view of vomiting and liquid  ileostomy output Negative C. 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 Difficile Intake and output monitoring  CAD (coronary artery disease) Continue aspirin , rosuvastatin  and lisinopril  No complaints of chest pain and EKG nonacute  COPD (chronic obstructive pulmonary disease) (HCC) Not acutely exacerbated DuoNebs as needed Continue home inhaler  Hypertension Continue lisinopril .  Hold amlodipine  in the setting of sepsis and resume it blood pressure remained stable  Hyperlipidemia, unspecified Continue rosuvastatin   Thyroid  nodule greater than or equal to 1.5 cm in diameter incidentally noted on imaging study For outpatient workup  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 Outpatient workup, PSA correlation recommended by radiologist     DVT prophylaxis: Lovenox   Consults: none  Advance Care Planning:   Code Status: Prior   Family Communication: none  Disposition Plan: Back to previous home environment  Severity of Illness: The appropriate patient status for this patient is INPATIENT. Inpatient status is judged to be reasonable and necessary in order to provide the required intensity of service to ensure the patient's safety. The patient's presenting symptoms, physical exam findings, and initial radiographic and laboratory data in the context of their  chronic comorbidities is felt to place them at high risk for further clinical deterioration. Furthermore, it is not anticipated that the patient will be medically stable for discharge from the hospital within 2 midnights of admission.   * I certify  that at the point of admission it is my clinical judgment that the patient will require inpatient hospital care spanning beyond 2 midnights from the point of admission due to high intensity of service, high risk for further deterioration and high frequency of surveillance required.*  Author: Lanetta Pion, MD 04/12/2024 3:55 AM  For on call review [email or web address].

## 2024-04-12 NOTE — Progress Notes (Signed)
  INTERVAL PROGRESS NOTE    Todd Rojas- 83 y.o. male  LOS: 0 __________________________________________________________________  SUBJECTIVE: Admitted 04/11/2024 with cc of  Chief Complaint  Patient presents with   Fever   Since admission, pain significantly resolved.   OBJECTIVE: Blood pressure 109/65, pulse (!) 104, temperature 98.3 F (36.8 C), temperature source Oral, resp. rate 20, height 5\' 9"  (1.753 m), weight 78.5 kg, SpO2 94%.  General: NAD, pleasant, able to participate in exam Cardiac: RRR, normal heart sounds, no murmurs. 2+ radial and PT pulses bilaterally Respiratory: CTAB, normal effort, No wheezes, rales or rhonchi Abdomen: soft, nontender, nondistended, no hepatic or splenomegaly, +BS. Surgical incisions healing well without drainage or infection. Ileostomy with watery brown stool, healthy granulation tissue Extremities: no edema. WWP. Skin: warm and dry, no rashes noted Neuro: alert and oriented, no focal deficits Psych: Normal affect and mood   ASSESSMENT/PLAN:  I have reviewed the full H&P by Dr. Vallarie Gauze, and I reviewed pertinent results  In addition:  Sepsis (HCC) ruled out. Neg procal, LA. Nothing acute on abdominal CT. Vital signs stable. BxCx NGTD. Afebrile. Antibiotics stopped.   Orthostatic Hypotension- with standing. Likely hypovolemic from POA vomit and watery stool. Systolic dropped to 50s with standing for a minute prior to dc. No LOC. Had hypotension on last admission after surgery so all chronic BP meds were held. Returned back to baseline once seated again.  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 If 7PM-7AM, please contact night-coverage

## 2024-04-12 NOTE — Assessment & Plan Note (Signed)
 Continue rosuvastatin.

## 2024-04-12 NOTE — Discharge Instructions (Signed)
 Please follow up with your surgeon and PCP for your post-surgical evaluations.  I recommend prioritizing use of your incentive spirometer and flutter valve as well as staying mobile to help expand your lungs and work out uncomfortable abdominal gas/bloating.

## 2024-04-12 NOTE — ED Notes (Signed)
 Answered call light, family of pt states pt did not ring the call light.

## 2024-04-12 NOTE — Progress Notes (Signed)
 Pt had d/c orders in, however, was very hypotensive upon vitals check, all other vitals WNL.  MD notified of low BP and MD still ok with BP as long as pt was asymptomatic.  No symptoms noted other than dizziness when standing.  Nurse attempted orthostatic vitals check to be sure patient was no symptomatic.  While obtaining standing vitals patient started feeling very weak and knees started to buckle.  BP was 57/48.  Pt unresponsive and started to go down to floor.  Pt did not hit floor, as this nurse and pt daughter put pt into bed before falling.  At this time rapid response was called.  Rapid was mistaken as code blue at first but code blue was canceled as patient never lost pulse or stopped breathing.  Pt started to respone within 30 seconds of laying on bed and was completely A&Ox4.  Rapid response team entered room.  See rapid response notes.

## 2024-04-12 NOTE — Assessment & Plan Note (Signed)
 Continue aspirin , rosuvastatin  and lisinopril  No complaints of chest pain and EKG nonacute

## 2024-04-12 NOTE — Significant Event (Signed)
 Initially paged/over head alert as a code blue. Code blue was cancelled and rapid response was called. On arrival, pt maintaining airway, has a pulse. Providers in the room. RRT ICU CN present, 2C RNs present, Resp. Therapy present. Marinus Sic RN starting IV access and fluid was ordered.

## 2024-04-12 NOTE — Care Management Important Message (Signed)
 Important Message  Patient Details  Name: Todd Rojas MRN: [id number] Date of Birth: [birth year]   Important Message Given:  Yes - Medicare IM     Anise Kerns 04/12/2024, 2:47 PM

## 2024-04-13 DIAGNOSIS — R6510 Systemic inflammatory response syndrome (SIRS) of non-infectious origin without acute organ dysfunction: Secondary | ICD-10-CM | POA: Diagnosis present

## 2024-04-13 DIAGNOSIS — K219 Gastro-esophageal reflux disease without esophagitis: Secondary | ICD-10-CM | POA: Diagnosis present

## 2024-04-13 DIAGNOSIS — E871 Hypo-osmolality and hyponatremia: Secondary | ICD-10-CM | POA: Diagnosis present

## 2024-04-13 DIAGNOSIS — Z1152 Encounter for screening for COVID-19: Secondary | ICD-10-CM | POA: Diagnosis not present

## 2024-04-13 DIAGNOSIS — Z85828 Personal history of other malignant neoplasm of skin: Secondary | ICD-10-CM | POA: Diagnosis not present

## 2024-04-13 DIAGNOSIS — N179 Acute kidney failure, unspecified: Secondary | ICD-10-CM | POA: Diagnosis present

## 2024-04-13 DIAGNOSIS — N390 Urinary tract infection, site not specified: Secondary | ICD-10-CM | POA: Diagnosis present

## 2024-04-13 DIAGNOSIS — H9190 Unspecified hearing loss, unspecified ear: Secondary | ICD-10-CM | POA: Diagnosis present

## 2024-04-13 DIAGNOSIS — N3000 Acute cystitis without hematuria: Secondary | ICD-10-CM | POA: Diagnosis not present

## 2024-04-13 DIAGNOSIS — Z7901 Long term (current) use of anticoagulants: Secondary | ICD-10-CM | POA: Diagnosis not present

## 2024-04-13 DIAGNOSIS — B9689 Other specified bacterial agents as the cause of diseases classified elsewhere: Secondary | ICD-10-CM | POA: Diagnosis not present

## 2024-04-13 DIAGNOSIS — I2510 Atherosclerotic heart disease of native coronary artery without angina pectoris: Secondary | ICD-10-CM | POA: Diagnosis present

## 2024-04-13 DIAGNOSIS — Z79899 Other long term (current) drug therapy: Secondary | ICD-10-CM | POA: Diagnosis not present

## 2024-04-13 DIAGNOSIS — R198 Other specified symptoms and signs involving the digestive system and abdomen: Secondary | ICD-10-CM | POA: Diagnosis not present

## 2024-04-13 DIAGNOSIS — J4489 Other specified chronic obstructive pulmonary disease: Secondary | ICD-10-CM | POA: Diagnosis present

## 2024-04-13 DIAGNOSIS — I951 Orthostatic hypotension: Secondary | ICD-10-CM | POA: Diagnosis present

## 2024-04-13 DIAGNOSIS — E861 Hypovolemia: Secondary | ICD-10-CM | POA: Diagnosis present

## 2024-04-13 DIAGNOSIS — N400 Enlarged prostate without lower urinary tract symptoms: Secondary | ICD-10-CM | POA: Diagnosis present

## 2024-04-13 DIAGNOSIS — Z932 Ileostomy status: Secondary | ICD-10-CM | POA: Diagnosis not present

## 2024-04-13 DIAGNOSIS — B961 Klebsiella pneumoniae [K. pneumoniae] as the cause of diseases classified elsewhere: Secondary | ICD-10-CM | POA: Diagnosis present

## 2024-04-13 DIAGNOSIS — E785 Hyperlipidemia, unspecified: Secondary | ICD-10-CM | POA: Diagnosis present

## 2024-04-13 DIAGNOSIS — I2699 Other pulmonary embolism without acute cor pulmonale: Secondary | ICD-10-CM | POA: Diagnosis present

## 2024-04-13 DIAGNOSIS — A419 Sepsis, unspecified organism: Secondary | ICD-10-CM | POA: Diagnosis not present

## 2024-04-13 DIAGNOSIS — E43 Unspecified severe protein-calorie malnutrition: Secondary | ICD-10-CM | POA: Diagnosis present

## 2024-04-13 DIAGNOSIS — I252 Old myocardial infarction: Secondary | ICD-10-CM | POA: Diagnosis not present

## 2024-04-13 DIAGNOSIS — I5022 Chronic systolic (congestive) heart failure: Secondary | ICD-10-CM | POA: Diagnosis present

## 2024-04-13 DIAGNOSIS — I110 Hypertensive heart disease with heart failure: Secondary | ICD-10-CM | POA: Diagnosis present

## 2024-04-13 DIAGNOSIS — E860 Dehydration: Secondary | ICD-10-CM | POA: Diagnosis present

## 2024-04-13 DIAGNOSIS — Z66 Do not resuscitate: Secondary | ICD-10-CM | POA: Diagnosis present

## 2024-04-13 LAB — CBC
HCT: 28.5 % — ABNORMAL LOW (ref 39.0–52.0)
Hemoglobin: 9.4 g/dL — ABNORMAL LOW (ref 13.0–17.0)
MCH: 32.4 pg (ref 26.0–34.0)
MCHC: 33 g/dL (ref 30.0–36.0)
MCV: 98.3 fL (ref 80.0–100.0)
Platelets: 221 10*3/uL (ref 150–400)
RBC: 2.9 MIL/uL — ABNORMAL LOW (ref 4.22–5.81)
RDW: 14.6 % (ref 11.5–15.5)
WBC: 18 10*3/uL — ABNORMAL HIGH (ref 4.0–10.5)
nRBC: 0 % (ref 0.0–0.2)

## 2024-04-13 LAB — BASIC METABOLIC PANEL WITH GFR
Anion gap: 7 (ref 5–15)
BUN: 37 mg/dL — ABNORMAL HIGH (ref 8–23)
CO2: 19 mmol/L — ABNORMAL LOW (ref 22–32)
Calcium: 7.9 mg/dL — ABNORMAL LOW (ref 8.9–10.3)
Chloride: 107 mmol/L (ref 98–111)
Creatinine, Ser: 2.68 mg/dL — ABNORMAL HIGH (ref 0.61–1.24)
GFR, Estimated: 23 mL/min — ABNORMAL LOW (ref >60)
Glucose, Bld: 99 mg/dL (ref 70–99)
Potassium: 4.5 mmol/L (ref 3.5–5.1)
Sodium: 133 mmol/L — ABNORMAL LOW (ref 135–145)

## 2024-04-13 LAB — MRSA NEXT GEN BY PCR, NASAL: MRSA by PCR Next Gen: NOT DETECTED

## 2024-04-13 LAB — TROPONIN I (HIGH SENSITIVITY)
Troponin I (High Sensitivity): 43 ng/L — ABNORMAL HIGH (ref <18)
Troponin I (High Sensitivity): 32 ng/L — ABNORMAL HIGH (ref <18)

## 2024-04-13 LAB — GLUCOSE, CAPILLARY
Glucose-Capillary: 133 mg/dL — ABNORMAL HIGH (ref 70–99)
Glucose-Capillary: 109 mg/dL — ABNORMAL HIGH (ref 70–99)
Glucose-Capillary: 101 mg/dL — ABNORMAL HIGH (ref 70–99)

## 2024-04-13 LAB — MAGNESIUM: Magnesium: 1.6 mg/dL — ABNORMAL LOW (ref 1.7–2.4)

## 2024-04-13 LAB — LACTIC ACID, PLASMA: Lactic Acid, Venous: 1 mmol/L (ref 0.5–1.9)

## 2024-04-13 MED ORDER — ENOXAPARIN SODIUM 30 MG/0.3ML IJ SOSY: 30.0000 mg | PREFILLED_SYRINGE | INTRAMUSCULAR | Status: DC

## 2024-04-13 MED ORDER — SIMETHICONE 40 MG/0.6ML PO SUSP
40.0000 mg | Freq: Four times a day (QID) | ORAL | Status: DC
  Administered 2024-04-13 (×3): 40 mg via ORAL
  Filled 2024-04-13 (×2): qty 0.6
  Filled 2024-04-13: qty 30
  Filled 2024-04-13 (×4): qty 0.6
  Filled 2024-04-13: qty 30
  Filled 2024-04-13: qty 0.6
  Filled 2024-04-13: qty 30

## 2024-04-13 MED ORDER — MIDODRINE HCL 5 MG PO TABS
10.0000 mg | ORAL_TABLET | Freq: Three times a day (TID) | ORAL | Status: DC
  Administered 2024-04-13 – 2024-04-19 (×21): 10 mg via ORAL
  Filled 2024-04-13 (×21): qty 2

## 2024-04-13 MED ORDER — SODIUM CHLORIDE 0.9 % IV BOLUS
500.0000 mL | Freq: Once | INTRAVENOUS | Status: AC
  Administered 2024-04-13: 500 mL via INTRAVENOUS

## 2024-04-13 MED ORDER — MAGNESIUM SULFATE 2 GM/50ML IV SOLN
2.0000 g | Freq: Once | INTRAVENOUS | Status: AC
  Administered 2024-04-13: 2 g via INTRAVENOUS
  Filled 2024-04-13: qty 50

## 2024-04-13 MED ORDER — SODIUM CHLORIDE 0.9 % IV SOLN
1.0000 g | Freq: Two times a day (BID) | INTRAVENOUS | Status: DC
  Administered 2024-04-13 – 2024-04-15 (×5): 1 g via INTRAVENOUS
  Filled 2024-04-13 (×5): qty 20

## 2024-04-13 NOTE — Progress Notes (Signed)
 Patient ID: Todd Rojas, male   DOB: 16-Oct-1941, 83 y.o.   MRN: [id number] [facility] Cardiology    SUBJECTIVE: Patient states he feels reasonably weak with hypotension weakness fatigue no diarrhea no fever chills or sweats tolerating midodrine  but still with orthostatic hypotension lightheaded weakness and fatigue no chest pain poor p.o. intake left flank discomfort   Vitals:   04/13/24 0307 04/13/24 0600 04/13/24 0927 04/13/24 1338  BP: (!) 81/43 (!) 89/50 (!) 82/43 (!) 100/48  Pulse: 65  61 73  Resp: 20  17 18   Temp: 98.7 F (37.1 C)  98 F (36.7 C) 98 F (36.7 C)  TempSrc: Oral  Oral   SpO2: 94%  95% 97%  Weight:      Height:         Intake/Output Summary (Last 24 hours) at 04/13/2024 1356 Last data filed at 04/13/2024 0900 Gross per 24 hour  Intake 240 ml  Output 750 ml  Net -510 ml      PHYSICAL EXAM  General: Well developed, well nourished, in no acute distress HEENT:  Normocephalic and atramatic Neck:  No JVD.  Lungs: Clear bilaterally to auscultation and percussion. Heart: HRRR . Normal S1 and S2 without gallops or murmurs.  Abdomen: Bowel sounds are positive, abdomen soft and non-tender  Msk:  Back normal, normal gait. Normal strength and tone for age. Extremities: No clubbing, cyanosis or edema.   Neuro: Alert and oriented X 3. Psych:  Good affect, responds appropriately   LABS: Basic Metabolic Panel: Recent Labs    04/12/24 1720 04/13/24 0546  NA 130* 133*  K 4.6 4.5  CL 102 107  CO2 22 19*  GLUCOSE 134* 99  BUN 29* 37*  CREATININE 1.97* 2.68*  CALCIUM  8.4* 7.9*  MG  --  1.6*   Liver Function Tests: Recent Labs    04/12/24 0028  AST 21  ALT 27  ALKPHOS 98  BILITOT 0.7  PROT 7.4  ALBUMIN  3.1*   No results for input(s): "LIPASE", "AMYLASE" in the last 72 hours. CBC: Recent Labs    04/11/24 2349 04/12/24 1720 04/13/24 0546  WBC 19.8* 23.0* 18.0*  NEUTROABS 17.4*  --   --   HGB 12.6* 11.1* 9.4*  HCT 37.6* 33.9* 28.5*  MCV  97.4 99.1 98.3  PLT 299 278 221   Cardiac Enzymes: No results for input(s): "CKTOTAL", "CKMB", "CKMBINDEX", "TROPONINI" in the last 72 hours. BNP: Invalid input(s): "POCBNP" D-Dimer: No results for input(s): "DDIMER" in the last 72 hours. Hemoglobin A1C: No results for input(s): "HGBA1C" in the last 72 hours. Fasting Lipid Panel: No results for input(s): "CHOL", "HDL", "LDLCALC", "TRIG", "CHOLHDL", "LDLDIRECT" in the last 72 hours. Thyroid  Function Tests: No results for input(s): "TSH", "T4TOTAL", "T3FREE", "THYROIDAB" in the last 72 hours.  Invalid input(s): "FREET3" Anemia Panel: No results for input(s): "VITAMINB12", "FOLATE", "FERRITIN", "TIBC", "IRON", "RETICCTPCT" in the last 72 hours.  CT ABDOMEN PELVIS W CONTRAST Result Date: 04/12/2024 CLINICAL DATA:  Recent abdominal surgery presenting with fever, leukocytosis and vomiting. EXAM: CT ABDOMEN AND PELVIS WITH CONTRAST TECHNIQUE: Multidetector CT imaging of the abdomen and pelvis was performed using the standard protocol following bolus administration of intravenous contrast. RADIATION DOSE REDUCTION: This exam was performed according to the departmental dose-optimization program which includes automated exposure control, adjustment of the mA and/or kV according to patient size and/or use of iterative reconstruction technique. CONTRAST:  OMNIPAQUE  IOHEXOL  300 MG/ML  SOLN COMPARISON:  April 02, 2024 FINDINGS: Lower chest: Stable moderate to  marked severity areas of atelectasis and/or infiltrate are seen within the posterior aspects of the bilateral lung bases. Hepatobiliary: A stable 3 mm focus of parenchymal low attenuation is seen within the anterior aspect of the left lobe of the liver (axial CT image 25, CT series 2). No gallstones, gallbladder wall thickening, or biliary dilatation. Pancreas: Unremarkable. No pancreatic ductal dilatation or surrounding inflammatory changes. Spleen: Normal in size without focal abnormality.  Adrenals/Urinary Tract: Adrenal glands are unremarkable. Kidneys are normal in size, without renal calculi or hydronephrosis. A stable simple cyst is seen within the right kidney. Areas of focal cortical scarring are noted within the anterolateral aspect of the left kidney. There is moderate severity dilatation of a short segment of the distal left ureter which extends to the level of the left UVJ. Bladder is unremarkable. Stomach/Bowel: The enteric tube seen on the prior study has been removed. There is a very small hiatal hernia. Appendix appears normal. No evidence of bowel wall thickening, distention, or inflammatory changes. Noninflamed diverticula are seen scattered throughout the sigmoid colon. Vascular/Lymphatic: Aortic atherosclerosis. No enlarged abdominal or pelvic lymph nodes. Reproductive: There is moderate to marked severity prostate gland enlargement. A stable 2.3 cm x 1.8 cm x 2.8 cm area of increased attenuation (approximately 124.31 Hounsfield units) is seen within the posterolateral aspect of the prostate gland on the right (axial CT image 95, CT series 2) Other: A right pelvic ostomy site is again seen. Small, stable bilateral fat containing inguinal hernias are also noted. No abdominopelvic ascites. Musculoskeletal: Multilevel degenerative changes are seen throughout the lumbar spine. IMPRESSION: 1. Stable moderate to marked severity posterior bibasilar atelectasis and/or infiltrate. 2. 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 3. Prostatomegaly with an area of increased parenchymal attenuation which may represent abnormal parenchymal enhancement. Correlation with PSA levels is recommended. Electronically Signed   By: Virgle Grime M.D.   On: 04/12/2024 01:43   DG Chest 2 View Result Date: 04/12/2024 CLINICAL DATA:  Possible sepsis EXAM: CHEST - 2 VIEW COMPARISON:  04/05/2024 FINDINGS: Elevation of the right diaphragm with atelectasis or scar at the right  base. No focal consolidation, pleural effusion or pneumothorax. Stable cardiomediastinal silhouette. IMPRESSION: No active cardiopulmonary disease. Elevation of the right diaphragm with atelectasis or scar at the right base. Electronically Signed   By: Esmeralda Hedge M.D.   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)   Increased ileostomy output (HCC)   Sepsis, unspecified organism (HCC)   Gassiness    Plan Orthostatic hypotension continue midodrine  recommend hydration normal saline or lactated Ringer 's Up out of bed to chair, short assisted ambulation Hold any antihypertensives Left flank pain possible trapped gas consider NG suction continue OTC pain meds Ileostomy in place secondary to colonic ileus appears to be functioning adequately mostly gas 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 intake including fluids No cardiac intervention indicated   Antonette Batters, MD, 04/13/2024 1:56 PM

## 2024-04-13 NOTE — Progress Notes (Signed)
 PT Cancellation Note  Patient Details Name: Todd Rojas MRN: [id number] DOB: 11-02-1941   Cancelled Treatment:    Reason Eval/Treat Not Completed: Medical issues which prohibited therapy. Patient not medically ready due to orthostasis (BP recorded at 9:30 is 82/43). Spoke with RN via secure chat, PT will hold at this time. Will re-attempt at later date/time as medically appropriate.    Cariah Salatino M Fairly, PT, DPT 04/13/24 10:18 AM

## 2024-04-13 NOTE — Progress Notes (Addendum)
 PROGRESS NOTE  Todd Rojas    DOB: [birth year], 83 y.o.  WUJ:[id number]    Code Status: Limited: Do not attempt resuscitation (DNR) -DNR-LIMITED -Do Not Intubate/DNI    DOA: 04/11/2024   LOS: 1   Brief hospital course   Todd Rojas is a 83 y.o. 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  He reports that his ileostomy output is very liquid whereas previously it was more solid.  Also has left mid back pain that started in the past 24 hours.  He denies dysuria, chest pain or shortness of breath ED course and data review: Tmax 99 and tachycardic to 101 tachypneic to the low 20s BP 122/65 Labs notable for the following:  WBC 19,800 With lactic acid 1.9-->1.4 and procalcitonin less than 0.10 Respiratory viral panel negative GI panel and stool for C. difficile negative Urinalysis unremarkable CMP notable for hyponatremia of 130 EKG, personally reviewed and interpreted with sinus at 95 with no acute ST-T wave changes Chest x-ray with no acute cardiopulmonary disease CT abdomen and pelvis with contrast concerning for possible infiltrate among other findings as follows: IMPRESSION: 1. Stable moderate to marked severity posterior bibasilar atelectasis and/or infiltrate. 2. 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 3. Prostatomegaly with an area of increased parenchymal attenuation which may represent abnormal parenchymal enhancement. Correlation with PSA levels is  recommended.  Preparing to dc yesterday when patient had orthostatic hypotension while standing to leave. Rapid response was called. Recovered fine once laid back down. Continues to be hypotensive despite IVF and midodrine .  Left flank pain is intermittent and colicky. Overnight coverage started meropenem.   04/13/24 -urine cultures return positive GNR.   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 No urinary symptoms, no CT evidence, neg chest xray, neg procal, normal LA. No fever. - urine culture has since resulted in positive GNR. Update: klebsiella  - overnight coverage started meropenem. Can downgrade.   Abdominal pain- intermittent. Improved since admission. Colicky.  - heating pad, voltaren  gel, tylenol   Orthostatic hypotension- related to hypovolemia. Troponins 43>32.  - continue to hold home antihypertensives - continue midodrine  - trial from IVF to avoid hypervolemia. EF 40% with global hypokinesia on echo 1 month ago - cardiology consulted per family request - thyroid  panel, had incidental nodule on imaging  GNR pyuria- asymptomatic. No hydronephrosis or signs of pyelonephritis on CT. Will treat with Abx  AKI- related to hypotension, possible UTI - follow BMP. Received IV fluids   Increased ileostomy output (HCC) S/p ileostomy 03/20/2024 secondary to colonic ileus Negative GI panel  and C. 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  VTE ppx: enoxaparin  (LOVENOX ) injection 40 mg Start: 04/12/24 0800   Diet:     Diet   Diet regular Fluid consistency: Thin   Consultants: Cardiology   Subjective 04/13/24    Pt reports feeling fine overall. Has not been out of bed since his orthostatic episode yesterday. Intermittent/colicky type pain of the left flank still which improved with the heating pad   Objective   Vitals:   04/12/24 2326 04/13/24 0134 04/13/24 0307 04/13/24 0600  BP: (!) 84/42 (!) 94/54 (!) 81/43 (!) 89/50  Pulse:  66 65   Resp:   20   Temp:   98.7 F (37.1 C)   TempSrc:   Oral   SpO2:   94%   Weight:      Height:        Intake/Output Summary (Last 24 hours) at 04/13/2024 0734 Last data filed at 04/13/2024 0724 Gross per 24 hour  Intake 0 ml  Output 1100 ml  Net -1100 ml   Filed Weights   04/11/24 2338  Weight: 78.5 kg    Physical Exam:  General: awake, alert, NAD HEENT: atraumatic, clear conjunctiva, anicteric sclera, MMM, hard of hearing Respiratory: normal respiratory effort. Cardiovascular: quick capillary refill, normal S1/S2, RRR, no JVD, murmurs Gastrointestinal: soft, NT, ND. Colostomy bag with liquid dark stool and gas.  Nervous: A&O x3. no gross focal neurologic deficits, normal speech Extremities: moves all equally, no edema, normal tone. No pain with palpation of back where pain is located Skin: dry, intact, normal temperature, normal color. No  rashes, lesions or ulcers on exposed skin Psychiatry: normal mood, congruent affect  Labs   I have personally reviewed the following labs and imaging studies CBC    Component Value Date/Time   WBC 18.0 (H) 04/13/2024 0546   RBC 2.90 (L) 04/13/2024 0546   HGB 9.4 (L) 04/13/2024 0546   HCT 28.5 (L) 04/13/2024 0546   PLT 221 04/13/2024 0546   MCV 98.3 04/13/2024 0546   MCH 32.4 04/13/2024 0546   MCHC 33.0 04/13/2024 0546   RDW 14.6 04/13/2024 0546   LYMPHSABS 0.8 04/11/2024 2349   MONOABS 1.3 (H) 04/11/2024 2349   EOSABS 0.0 04/11/2024 2349   BASOSABS 0.1 04/11/2024 2349      Latest Ref Rng & Units 04/13/2024    5:46 AM 04/12/2024    5:20 PM 04/12/2024   12:28 AM  BMP  Glucose 70 - 99 mg/dL 99  528  413   BUN 8 - 23 mg/dL 37  29  29   Creatinine 0.61 - 1.24 mg/dL 2.44  0.10  2.72   Sodium 135 - 145 mmol/L 133  130  130   Potassium 3.5 - 5.1 mmol/L 4.5  4.6  4.4   Chloride 98 - 111 mmol/L 107  102  101   CO2 22 - 32 mmol/L 19  22  20    Calcium  8.9 - 10.3 mg/dL 7.9  8.4  8.7  CT ABDOMEN PELVIS W CONTRAST Result Date: 04/12/2024 CLINICAL DATA:  Recent abdominal surgery presenting with fever, leukocytosis and vomiting. EXAM: CT ABDOMEN AND PELVIS WITH CONTRAST TECHNIQUE: Multidetector CT imaging of the abdomen and pelvis was performed using the standard protocol following bolus administration of intravenous contrast. RADIATION DOSE REDUCTION: This exam was performed according to the departmental dose-optimization program which includes automated exposure control, adjustment of the mA and/or kV according to patient size and/or use of iterative reconstruction technique. CONTRAST:  OMNIPAQUE  IOHEXOL  300 MG/ML  SOLN COMPARISON:  April 02, 2024 FINDINGS: Lower chest: Stable moderate to marked severity areas of atelectasis and/or infiltrate are seen within the posterior aspects of the bilateral lung bases. Hepatobiliary: A stable 3 mm focus of parenchymal low attenuation is seen  within the anterior aspect of the left lobe of the liver (axial CT image 25, CT series 2). No gallstones, gallbladder wall thickening, or biliary dilatation. Pancreas: Unremarkable. No pancreatic ductal dilatation or surrounding inflammatory changes. Spleen: Normal in size without focal abnormality. Adrenals/Urinary Tract: Adrenal glands are unremarkable. Kidneys are normal in size, without renal calculi or hydronephrosis. A stable simple cyst is seen within the right kidney. Areas of focal cortical scarring are noted within the anterolateral aspect of the left kidney. There is moderate severity dilatation of a short segment of the distal left ureter which extends to the level of the left UVJ. Bladder is unremarkable. Stomach/Bowel: The enteric tube seen on the prior study has been removed. There is a very small hiatal hernia. Appendix appears normal. No evidence of bowel wall thickening, distention, or inflammatory changes. Noninflamed diverticula are seen scattered throughout the sigmoid colon. Vascular/Lymphatic: Aortic atherosclerosis. No enlarged abdominal or pelvic lymph nodes. Reproductive: There is moderate to marked severity prostate gland enlargement. A stable 2.3 cm x 1.8 cm x 2.8 cm area of increased attenuation (approximately 124.31 Hounsfield units) is seen within the posterolateral aspect of the prostate gland on the right (axial CT image 95, CT series 2) Other: A right pelvic ostomy site is again seen. Small, stable bilateral fat containing inguinal hernias are also noted. No abdominopelvic ascites. Musculoskeletal: Multilevel degenerative changes are seen throughout the lumbar spine. IMPRESSION: 1. Stable moderate to marked severity posterior bibasilar atelectasis and/or infiltrate. 2. 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 3. Prostatomegaly with an area of increased parenchymal attenuation which may represent abnormal parenchymal enhancement. Correlation with PSA  levels is recommended. Electronically Signed   By: Virgle Grime M.D.   On: 04/12/2024 01:43   DG Chest 2 View Result Date: 04/12/2024 CLINICAL DATA:  Possible sepsis EXAM: CHEST - 2 VIEW COMPARISON:  04/05/2024 FINDINGS: Elevation of the right diaphragm with atelectasis or scar at the right base. No focal consolidation, pleural effusion or pneumothorax. Stable cardiomediastinal silhouette. IMPRESSION: No active cardiopulmonary disease. Elevation of the right diaphragm with atelectasis or scar at the right base. Electronically Signed   By: Esmeralda Hedge M.D.   On: 04/12/2024 00:20    Disposition Plan & Communication  Patient status: Observation  Admitted From: Home Planned disposition location: Home Anticipated discharge date: 4/28 pending BP stability  Family Communication: wife at bedside    Author: Ree Candy, DO [facility] 04/13/2024, 7:34 AM   Available by Epic secure chat 7AM-7PM. If 7PM-7AM, please contact night-coverage.  [facility] contact information found on [email or web address].

## 2024-04-13 NOTE — Plan of Care (Signed)
°  Problem: Fluid Volume: °Goal: Hemodynamic stability will improve °Outcome: Progressing °  °Problem: Clinical Measurements: °Goal: Diagnostic test results will improve °Outcome: Progressing °Goal: Signs and symptoms of infection will decrease °Outcome: Progressing °  °

## 2024-04-13 NOTE — Progress Notes (Signed)
   04/13/24 0800  Spiritual Encounters  Type of Visit Follow up  Care provided to: Pt and family  Conversation partners present during encounter Nurse  Reason for visit Routine spiritual support  OnCall Visit Yes   Chaplain followed up with patient to see how his night had been after a RRT the night before.  Patient was sleeping but wife was present.  Chaplain offered presence and care.  Chaplain checked with Nurse as well to share about the visit.  Rev. Rana M. Nolon Baxter, M.Div. Chaplain Resident [facility]

## 2024-04-13 NOTE — Significant Event (Signed)
 CROSS COVER NOTE  NAME: Todd Rojas MRN: [id number] DOB : Dec 22, 1940 ATTENDING PHYSICIAN: Ree Candy, MD    Date of Service   04/13/2024   HPI/Events of Note   Contacted by RN regarding ongoing hypotension Patient admitted 4/24 for sepsis.  He is s/p ileostomy 03/20/2024.  He has had high output with nausea and vomiting and left flank/mid back pain.Sepsis work up was done and started on vanc meropenem and flagyl . Ua not indicative of infection.  Patient was going to be discharged 4/25 but had syncopal event and hypotension. + orthostasis Labs  ordered by MD indicative of worsening leukocytosis and AKI development CT abdomen/pelvis with contras - results reviewed - dilitation lf left ureter - chronic; no stone, pyeolo, or hydronephrosis  UA not indicative of UTI Possible atelectasis/infiltrate BLL worse on left per radiologists discussion Not currently on antibiotics Prior cultures pansensitive pseudomonas  Latest Reference Range & Units 04/11/24 23:49 04/12/24 00:28 04/12/24 01:45 04/12/24 17:20  BASIC METABOLIC PANEL WITH GFR     Rpt !  COMPREHENSIVE METABOLIC PANEL WITH GFR   Rpt !    Sodium 135 - 145 mmol/L  130 (L)  130 (L)  Potassium 3.5 - 5.1 mmol/L  4.4  4.6  Chloride 98 - 111 mmol/L  101  102  CO2 22 - 32 mmol/L  20 (L)  22  Glucose 70 - 99 mg/dL  865 (H)  784 (H)  BUN 8 - 23 mg/dL  29 (H)  29 (H)  Creatinine 0.61 - 1.24 mg/dL  6.96  2.95 (H)  Calcium  8.9 - 10.3 mg/dL  8.7 (L)  8.4 (L)  Anion gap 5 - 15   9  6   Alkaline Phosphatase 38 - 126 U/L  98    Albumin  3.5 - 5.0 g/dL  3.1 (L)    AST 15 - 41 U/L  21    ALT 0 - 44 U/L  27    Total Protein 6.5 - 8.1 g/dL  7.4    Total Bilirubin 0.0 - 1.2 mg/dL  0.7    GFR, Estimated >60 mL/min  >60  33 (L)  Lactic Acid, Venous 0.5 - 1.9 mmol/L 1.9  1.4   Procalcitonin ng/mL  <0.10    WBC 4.0 - 10.5 K/uL 19.8 (H)   23.0 (H)  RBC 4.22 - 5.81 MIL/uL 3.86 (L)   3.42 (L)  Hemoglobin 13.0 - 17.0 g/dL 28.4 (L)    13.2 (L)  HCT 39.0 - 52.0 % 37.6 (L)   33.9 (L)  MCV 80.0 - 100.0 fL 97.4   99.1  MCH 26.0 - 34.0 pg 32.6   32.5  MCHC 30.0 - 36.0 g/dL 44.0   10.2  RDW 72.5 - 15.5 % 14.0   14.3  Platelets 150 - 400 K/uL 299   278  nRBC 0.0 - 0.2 % 0.0   0.0  Neutrophils % 88     Lymphocytes % 4     Monocytes Relative % 7     Eosinophil % 0     Basophil % 0     Immature Granulocytes % 1     NEUT# 1.7 - 7.7 K/uL 17.4 (H)     Lymphs Abs 0.7 - 4.0 K/uL 0.8     Monocyte # 0.1 - 1.0 K/uL 1.3 (H)     Eosinophils Absolute 0.0 - 0.5 K/uL 0.0     Basophils Absolute 0.0 - 0.1 K/uL 0.1  Abs Immature Granulocytes 0.00 - 0.07 K/uL 0.14 (H)     Prothrombin Time 11.4 - 15.2 seconds 13.9     INR 0.8 - 1.2  1.1     !: Data is abnormal (L): Data is abnormally low (H): Data is abnormally high Rpt: View report in Results Review for more information    Interventions   Assessment/Plan:    04/13/2024    1:34 AM 04/12/2024   11:26 PM 04/12/2024   10:52 PM  Vitals with BMI  Systolic 94 84 81  Diastolic 54 42 40  Pulse 66  68    Patient alert and oriented Only endorses continued left flank pain Respirations non labored SB on monitor   Severe sepsis with shock Additional liter of fluid ordered and increased maintenance to  150 Midodrine   F/u cultures Restart meropenem  AKI  Additional fluids given Avoid nephrotoxic meds Hold diuretics  CRITICAL CARE Performed by: Elisabeth Guild   Total critical care time: 60 minutes  Critical care time was exclusive of separately billable procedures and treating other patients.  Critical care was necessary to treat or prevent imminent or life-threatening deterioration.  Critical care was time spent personally by me on the following activities: development of treatment plan with patient and/or surrogate as well as nursing, discussions with consultants, evaluation of patient's response to treatment, examination of patient, obtaining history from patient or  surrogate, ordering and performing treatments and interventions, ordering and review of laboratory studies, ordering and review of radiographic studies, pulse oximetry and re-evaluation of patient's condition.      Kip Peon NP [facility] Cross Cover 7pm-7am - check amion for availability Pager [phone]

## 2024-04-13 NOTE — Progress Notes (Signed)
 Pharmacy Antibiotic Note  Todd Rojas is a 83 y.o. male admitted on 04/11/2024 with sepsis.  Pharmacy has been consulted for meropenem dosing.  Plan: Meropenem 1 gm IV Q12H ordered to start on 4/26 @ 0330.   Height: 5\' 9"  (175.3 cm) Weight: 78.5 kg (173 lb) IBW/kg (Calculated) : 70.7  Temp (24hrs), Avg:98.6 F (37 C), Min:97.6 F (36.4 C), Max:99.1 F (37.3 C)  Recent Labs  Lab 04/08/24 0446 04/11/24 2349 04/12/24 0028 04/12/24 0145 04/12/24 1720 04/12/24 2153 04/13/24 0036  WBC  --  19.8*  --   --  23.0*  --   --   CREATININE 0.78  --  1.12  --  1.97*  --   --   LATICACIDVEN  --  1.9  --  1.4  --  1.3 1.0    Estimated Creatinine Clearance: 28.4 mL/min (A) (by C-G formula based on SCr of 1.97 mg/dL (H)).    No Known Allergies  Antimicrobials this admission:   >>    >>   Dose adjustments this admission:   Microbiology results:  BCx:   UCx:    Sputum:    MRSA PCR:   Thank you for allowing pharmacy to be a part of this patient's care.  Todd Rojas D 04/13/2024 2:56 AM

## 2024-04-13 NOTE — Progress Notes (Signed)
 OT Cancellation Note  Patient Details Name: Todd Rojas MRN: [id number] DOB: August 17, 1941   Cancelled Treatment:    Reason Eval/Treat Not Completed: Medical issues which prohibited therapy.  Patient not medically ready due to orthostasis (BP recorded at 9:30 is 82/43). OT will hold at this time.    Aixa Corsello L. Kahlia Lagunes, OTR/L  04/13/24, 10:31 AM

## 2024-04-13 NOTE — Plan of Care (Signed)
  Problem: Fluid Volume: Goal: Hemodynamic stability will improve Outcome: Progressing   Problem: Clinical Measurements: Goal: Diagnostic test results will improve Outcome: Progressing Goal: Signs and symptoms of infection will decrease Outcome: Progressing   Problem: Respiratory: Goal: Ability to maintain adequate ventilation will improve Outcome: Progressing   Problem: Respiratory: Goal: Ability to maintain adequate ventilation will improve Outcome: Progressing

## 2024-04-14 DIAGNOSIS — B9689 Other specified bacterial agents as the cause of diseases classified elsewhere: Secondary | ICD-10-CM | POA: Diagnosis not present

## 2024-04-14 DIAGNOSIS — I951 Orthostatic hypotension: Secondary | ICD-10-CM | POA: Diagnosis not present

## 2024-04-14 DIAGNOSIS — N390 Urinary tract infection, site not specified: Principal | ICD-10-CM

## 2024-04-14 LAB — CBC
HCT: 30.1 % — ABNORMAL LOW (ref 39.0–52.0)
Hemoglobin: 9.8 g/dL — ABNORMAL LOW (ref 13.0–17.0)
MCH: 32.5 pg (ref 26.0–34.0)
MCHC: 32.6 g/dL (ref 30.0–36.0)
MCV: 99.7 fL (ref 80.0–100.0)
Platelets: 212 10*3/uL (ref 150–400)
RBC: 3.02 MIL/uL — ABNORMAL LOW (ref 4.22–5.81)
RDW: 14.6 % (ref 11.5–15.5)
WBC: 12.4 10*3/uL — ABNORMAL HIGH (ref 4.0–10.5)
nRBC: 0 % (ref 0.0–0.2)

## 2024-04-14 LAB — BASIC METABOLIC PANEL WITH GFR
Anion gap: 4 — ABNORMAL LOW (ref 5–15)
BUN: 33 mg/dL — ABNORMAL HIGH (ref 8–23)
CO2: 20 mmol/L — ABNORMAL LOW (ref 22–32)
Calcium: 8.1 mg/dL — ABNORMAL LOW (ref 8.9–10.3)
Chloride: 107 mmol/L (ref 98–111)
Creatinine, Ser: 1.48 mg/dL — ABNORMAL HIGH (ref 0.61–1.24)
GFR, Estimated: 47 mL/min — ABNORMAL LOW (ref >60)
Glucose, Bld: 103 mg/dL — ABNORMAL HIGH (ref 70–99)
Potassium: 4.5 mmol/L (ref 3.5–5.1)
Sodium: 131 mmol/L — ABNORMAL LOW (ref 135–145)

## 2024-04-14 LAB — URINE CULTURE: Culture: 20000 — AB

## 2024-04-14 MED ORDER — SIMETHICONE 40 MG/0.6ML PO SUSP
40.0000 mg | Freq: Four times a day (QID) | ORAL | Status: DC
  Administered 2024-04-14 – 2024-04-19 (×19): 40 mg via ORAL
  Filled 2024-04-14: qty 30
  Filled 2024-04-14: qty 0.6
  Filled 2024-04-14 (×3): qty 30
  Filled 2024-04-14: qty 0.6
  Filled 2024-04-14: qty 30
  Filled 2024-04-14 (×3): qty 0.6
  Filled 2024-04-14: qty 30
  Filled 2024-04-14: qty 0.6
  Filled 2024-04-14 (×5): qty 30
  Filled 2024-04-14: qty 0.6
  Filled 2024-04-14: qty 30
  Filled 2024-04-14: qty 0.6
  Filled 2024-04-14: qty 30
  Filled 2024-04-14 (×2): qty 0.6
  Filled 2024-04-14: qty 30
  Filled 2024-04-14: qty 0.6
  Filled 2024-04-14: qty 30

## 2024-04-14 MED ORDER — ENOXAPARIN SODIUM 40 MG/0.4ML IJ SOSY
40.0000 mg | PREFILLED_SYRINGE | INTRAMUSCULAR | Status: DC
  Administered 2024-04-15 – 2024-04-16 (×2): 40 mg via SUBCUTANEOUS
  Filled 2024-04-14 (×2): qty 0.4

## 2024-04-14 MED ORDER — CYCLOBENZAPRINE HCL 10 MG PO TABS
5.0000 mg | ORAL_TABLET | Freq: Three times a day (TID) | ORAL | Status: DC | PRN
Start: 2024-04-14 — End: 2024-04-19
  Administered 2024-04-14 – 2024-04-19 (×6): 5 mg via ORAL
  Filled 2024-04-14 (×6): qty 1

## 2024-04-14 MED ORDER — SODIUM CHLORIDE 0.9 % IV BOLUS
500.0000 mL | Freq: Once | INTRAVENOUS | Status: AC
  Administered 2024-04-14: 500 mL via INTRAVENOUS

## 2024-04-14 NOTE — Evaluation (Signed)
 Occupational Therapy Evaluation Patient Details Name: Todd Rojas MRN: [id number] DOB: 1941/03/01 Today's Date: 04/14/2024   History of Present Illness   Pt is an 83 y/o M admitted on 04/11/24. Pt with recent 1 month long admission. Pt presents with c/o fever & vomiting. PMH: CAD, COPD, HTN, ILD, bronchiectasis, COPD     Clinical Impressions Patient presenting with decreased Ind in self care,balance, functional mobility/transfers, endurance, and safety awareness. Patient reports living with wife and using RW for mobility since recent hospitalization. Pt has been emptying his ostomy and wife change changed it since hospital discharge. Patient currently functioning at close supervision - CGA. Pt does endorse fatigue but overall in good spirits at this time. Patient will benefit from acute OT to increase overall independence in the areas of ADLs, functional mobility, and safety awareness in order to safely discharge.     If plan is discharge home, recommend the following:   A little help with walking and/or transfers;A little help with bathing/dressing/bathroom;Assistance with cooking/housework;Assist for transportation;Help with stairs or ramp for entrance     Functional Status Assessment   Patient has had a recent decline in their functional status and demonstrates the ability to make significant improvements in function in a reasonable and predictable amount of time.     Equipment Recommendations   BSC/3in1;Tub/shower seat      Precautions/Restrictions   Precautions Precautions: Fall Recall of Precautions/Restrictions: Intact Precaution/Restrictions Comments: watch BP     Mobility Bed Mobility Overal bed mobility: Needs Assistance Bed Mobility: Supine to Sit     Supine to sit: Supervision, HOB elevated, Used rails                      Balance Overall balance assessment: Needs assistance Sitting-balance support: Feet supported, Bilateral upper  extremity supported Sitting balance-Leahy Scale: Good     Standing balance support: During functional activity, Bilateral upper extremity supported, Reliant on assistive device for balance Standing balance-Leahy Scale: Fair                             ADL either performed or assessed with clinical judgement   ADL Overall ADL's : Needs assistance/impaired                         Toilet Transfer: Contact guard assist;Cueing for sequencing;Ambulation;Rolling walker (2 wheels)   Toileting- Clothing Manipulation and Hygiene: Contact guard assist;Sit to/from stand               Vision Patient Visual Report: No change from baseline              Pertinent Vitals/Pain Pain Assessment Pain Assessment: No/denies pain     Extremity/Trunk Assessment Upper Extremity Assessment Upper Extremity Assessment: Overall WFL for tasks assessed   Lower Extremity Assessment Lower Extremity Assessment: Generalized weakness       Communication Communication Communication: Impaired Factors Affecting Communication: Hearing impaired   Cognition Arousal: Alert Behavior During Therapy: WFL for tasks assessed/performed Cognition: No apparent impairments Difficult to assess due to: Hard of hearing/deaf                             Following commands: Intact Following commands impaired: Follows multi-step commands with increased time     Cueing  General Comments   Cueing Techniques: Verbal cues;Tactile cues  Pt received on 2L/min, weaned  to room air with SpO2 >/= 90% throughout, left on room air at end of session & nurse made aware.           Home Living Family/patient expects to be discharged to:: Private residence Living Arrangements: Spouse/significant other;Children Available Help at Discharge: Family Type of Home: House Home Access: Stairs to enter Secretary/administrator of Steps: 3 Entrance Stairs-Rails: None Home Layout: One level      Bathroom Shower/Tub: Tub/shower unit;Tub only         Home Equipment: Agricultural consultant (2 wheels);Cane - single point   Additional Comments: home information from chart      Prior Functioning/Environment Prior Level of Function : Independent/Modified Independent             Mobility Comments: prior to last admission, ambulatory without AD, was using RW since last admission      OT Problem List: Decreased strength;Decreased activity tolerance;Impaired balance (sitting and/or standing);Decreased knowledge of use of DME or AE   OT Treatment/Interventions: Self-care/ADL training;Balance training;Therapeutic exercise;Therapeutic activities;DME and/or AE instruction;Patient/family education      OT Goals(Current goals can be found in the care plan section)   Acute Rehab OT Goals Patient Stated Goal: to go home OT Goal Formulation: With patient Time For Goal Achievement: 04/28/24 Potential to Achieve Goals: Good ADL Goals Pt Will Perform Grooming: with modified independence;standing Pt Will Perform Lower Body Dressing: with modified independence;sit to/from stand Pt Will Transfer to Toilet: with modified independence;ambulating Pt Will Perform Toileting - Clothing Manipulation and hygiene: with modified independence;sit to/from stand   OT Frequency:  Min 2X/week    Co-evaluation   Reason for Co-Treatment: For patient/therapist safety PT goals addressed during session: Mobility/safety with mobility;Balance;Proper use of DME OT goals addressed during session: Proper use of Adaptive equipment and DME;ADL's and self-care      AM-PAC OT "6 Clicks" Daily Activity     Outcome Measure Help from another person eating meals?: None Help from another person taking care of personal grooming?: None Help from another person toileting, which includes using toliet, bedpan, or urinal?: A Little Help from another person bathing (including washing, rinsing, drying)?: A Little Help from  another person to put on and taking off regular upper body clothing?: A Little Help from another person to put on and taking off regular lower body clothing?: A Little 6 Click Score: 20   End of Session Equipment Utilized During Treatment: Rolling walker (2 wheels) Nurse Communication: Mobility status  Activity Tolerance: Patient tolerated treatment well Patient left: in chair;with call bell/phone within reach;with chair alarm set  OT Visit Diagnosis: Other abnormalities of gait and mobility (R26.89)                Time: 0454-0981 OT Time Calculation (min): 20 min Charges:  OT General Charges $OT Visit: 1 Visit OT Evaluation $OT Eval Low Complexity: 1 Low  George Kinder, MS, OTR/L , CBIS ascom [phone]  04/14/24, 1:39 PM

## 2024-04-14 NOTE — Progress Notes (Signed)
 Patient ID: Todd Rojas, male   DOB: 21-Jan-1941, 83 y.o.   MRN: [id number] [facility]    SUBJECTIVE: Still feels somewhat weak resting comfortably no chest pain still has significant weakness with ambulation and upright activity evidence of hypotension dehydration volume depletion.  Feels somewhat better than yesterday   Vitals:   04/13/24 0927 04/13/24 1338 04/13/24 1642 04/14/24 0816  BP: (!) 82/43 (!) 100/48 (!) 91/52 98/65  Pulse: 61 73 71 66  Resp: 17 18 18 16   Temp: 98 F (36.7 C) 98 F (36.7 C) 98 F (36.7 C) 97.9 F (36.6 C)  TempSrc: Oral   Oral  SpO2: 95% 97% 93% 96%  Weight:      Height:         Intake/Output Summary (Last 24 hours) at 04/14/2024 1037 Last data filed at 04/13/2024 1900 Gross per 24 hour  Intake 300 ml  Output 300 ml  Net 0 ml      PHYSICAL EXAM  General: Well developed, well nourished, in no acute distress HEENT:  Normocephalic and atramatic Neck:  No JVD.  Lungs: Clear bilaterally to auscultation and percussion. Heart: HRRR . Normal S1 and S2 without gallops or murmurs.  Abdomen: Bowel sounds are positive, abdomen soft and non-tender  Msk:  Back normal, normal gait. Normal strength and tone for age. Extremities: No clubbing, cyanosis or edema.   Neuro: Alert and oriented X 3. Psych:  Good affect, responds appropriately   LABS: Basic Metabolic Panel: Recent Labs    04/13/24 0546 04/14/24 0525  NA 133* 131*  K 4.5 4.5  CL 107 107  CO2 19* 20*  GLUCOSE 99 103*  BUN 37* 33*  CREATININE 2.68* 1.48*  CALCIUM  7.9* 8.1*  MG 1.6*  --    Liver Function Tests: Recent Labs    04/12/24 0028  AST 21  ALT 27  ALKPHOS 98  BILITOT 0.7  PROT 7.4  ALBUMIN  3.1*   No results for input(s): "LIPASE", "AMYLASE" in the last 72 hours. CBC: Recent Labs    04/11/24 2349 04/12/24 1720 04/13/24 0546 04/14/24 0525  WBC 19.8*   < > 18.0* 12.4*  NEUTROABS 17.4*  --   --   --   HGB 12.6*   < > 9.4* 9.8*  HCT 37.6*   < > 28.5*  30.1*  MCV 97.4   < > 98.3 99.7  PLT 299   < > 221 212   < > = values in this interval not displayed.   Cardiac Enzymes: No results for input(s): "CKTOTAL", "CKMB", "CKMBINDEX", "TROPONINI" in the last 72 hours. BNP: Invalid input(s): "POCBNP" D-Dimer: No results for input(s): "DDIMER" in the last 72 hours. Hemoglobin A1C: No results for input(s): "HGBA1C" in the last 72 hours. Fasting Lipid Panel: No results for input(s): "CHOL", "HDL", "LDLCALC", "TRIG", "CHOLHDL", "LDLDIRECT" in the last 72 hours. Thyroid  Function Tests: No results for input(s): "TSH", "T4TOTAL", "T3FREE", "THYROIDAB" in the last 72 hours.  Invalid input(s): "FREET3" Anemia Panel: No results for input(s): "VITAMINB12", "FOLATE", "FERRITIN", "TIBC", "IRON", "RETICCTPCT" in the last 72 hours.  No results found.   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)   Increased ileostomy output (HCC)   Sepsis, unspecified organism (HCC)   Gassiness   Orthostatic hypotension    Plan Orthostatic hypotension dehydration recommend volume repletion continue midodrine  gradually increase activity with assistance Agree with physical therapy for short ambulation recommend out of bed to chair 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

## 2024-04-14 NOTE — Evaluation (Signed)
 Physical Therapy Evaluation Patient Details Name: Todd Rojas MRN: [id number] DOB: 11-29-1941 Today's Date: 04/14/2024  History of Present Illness  Pt is an 83 y/o M admitted on 04/11/24. Pt with recent 1 month long admission. Pt presents with c/o fever & vomiting. PMH: CAD, COPD, HTN, ILD, bronchiectasis, COPD  Clinical Impression  Pt seen for PT evaluation with pt agreeable to tx, wife present for session. Pt very pleasant throughout session, eager to get better & go home. Pt is able to complete bed mobility with supervision, STS with CGA fade to supervision, & ambulate with RW & CGA with 2 standing rest breaks 2/2 fatigue. Pt without c/o dizziness, lightheadedness, etc during session. Encouraged pt to drink water to make sure he doesn't get dehydrated. Will continue to follow pt acutely to progress mobility as able.  BP checked in LUE  BP  Supine  90/43 mmHg MAP 58  Sitting 102/53 mmHg MAP 68  Standing at 0 116/54 mmHg MAP 73  After gait, sitting in recliner 125/50 mmHg MAP 72           If plan is discharge home, recommend the following: Assist for transportation;Help with stairs or ramp for entrance;A little help with bathing/dressing/bathroom;A little help with walking and/or transfers;Assistance with cooking/housework   Can travel by private vehicle   Yes    Equipment Recommendations None recommended by PT  Recommendations for Other Services       Functional Status Assessment Patient has had a recent decline in their functional status and demonstrates the ability to make significant improvements in function in a reasonable and predictable amount of time.     Precautions / Restrictions Precautions Precautions: Fall Precaution/Restrictions Comments: watch BP Restrictions Weight Bearing Restrictions Per Provider Order: No      Mobility  Bed Mobility Overal bed mobility: Needs Assistance Bed Mobility: Supine to Sit     Supine to sit: Supervision, HOB  elevated, Used rails (exits L side of bed)          Transfers Overall transfer level: Needs assistance Equipment used: Rolling walker (2 wheels) Transfers: Sit to/from Stand Sit to Stand: Contact guard assist           General transfer comment: STS without AD from EOB, STS with grab bars & RW with supervision in bathroom    Ambulation/Gait Ambulation/Gait assistance: Contact guard assist Gait Distance (Feet): 150 Feet Assistive device: Rolling walker (2 wheels) Gait Pattern/deviations: Decreased step length - right, Decreased step length - left, Decreased stride length Gait velocity: decreased     General Gait Details: 2 standing rest breaks 2/2 fatigue  Stairs            Wheelchair Mobility     Tilt Bed    Modified Rankin (Stroke Patients Only)       Balance Overall balance assessment: Needs assistance Sitting-balance support: Feet supported, Bilateral upper extremity supported Sitting balance-Leahy Scale: Good     Standing balance support: During functional activity, Bilateral upper extremity supported, Reliant on assistive device for balance Standing balance-Leahy Scale: Fair                               Pertinent Vitals/Pain Pain Assessment Pain Assessment: No/denies pain    Home Living Family/patient expects to be discharged to:: Private residence Living Arrangements: Spouse/significant other;Children Available Help at Discharge: Family Type of Home: House Home Access: Stairs to enter Entrance Stairs-Rails: None Entrance Stairs-Number of  Steps: 3   Home Layout: One level Home Equipment: Rolling Walker (2 wheels);Cane - single point Additional Comments: home information from chart    Prior Function Prior Level of Function : Independent/Modified Independent             Mobility Comments: prior to last admission, ambulatory without AD, was using RW since last admission       Extremity/Trunk Assessment   Upper  Extremity Assessment Upper Extremity Assessment: Overall WFL for tasks assessed;Generalized weakness    Lower Extremity Assessment Lower Extremity Assessment: Generalized weakness       Communication   Communication Communication: Impaired Factors Affecting Communication: Hearing impaired    Cognition Arousal: Alert Behavior During Therapy: WFL for tasks assessed/performed   PT - Cognitive impairments: No apparent impairments                       PT - Cognition Comments: pt does not volunteer any information but answers questions appropriately Following commands: Intact Following commands impaired: Follows multi-step commands with increased time     Cueing Cueing Techniques: Verbal cues, Tactile cues     General Comments General comments (skin integrity, edema, etc.): Pt received on 2L/min, weaned to room air with SpO2 >/= 90% throughout, left on room air at end of session & nurse made aware.    Exercises     Assessment/Plan    PT Assessment Patient needs continued PT services  PT Problem List Decreased strength;Decreased activity tolerance;Decreased range of motion;Decreased balance;Decreased mobility;Decreased knowledge of use of DME;Cardiopulmonary status limiting activity       PT Treatment Interventions DME instruction;Balance training;Gait training;Neuromuscular re-education;Stair training;Functional mobility training;Patient/family education;Therapeutic activities;Therapeutic exercise    PT Goals (Current goals can be found in the Care Plan section)  Acute Rehab PT Goals Patient Stated Goal: get better, go home PT Goal Formulation: With patient/family Time For Goal Achievement: 04/28/24 Potential to Achieve Goals: Good    Frequency Min 2X/week     Co-evaluation PT/OT/SLP Co-Evaluation/Treatment: Yes Reason for Co-Treatment: For patient/therapist safety (pt recently with RR called 2/2 low BP) PT goals addressed during session: Mobility/safety  with mobility;Balance;Proper use of DME         AM-PAC PT "6 Clicks" Mobility  Outcome Measure Help needed turning from your back to your side while in a flat bed without using bedrails?: None Help needed moving from lying on your back to sitting on the side of a flat bed without using bedrails?: A Little Help needed moving to and from a bed to a chair (including a wheelchair)?: A Little Help needed standing up from a chair using your arms (e.g., wheelchair or bedside chair)?: A Little Help needed to walk in hospital room?: A Little Help needed climbing 3-5 steps with a railing? : A Little 6 Click Score: 19    End of Session   Activity Tolerance: Patient tolerated treatment well Patient left: in chair;with family/visitor present;with call bell/phone within reach Nurse Communication: Mobility status PT Visit Diagnosis: Other abnormalities of gait and mobility (R26.89);Muscle weakness (generalized) (M62.81)    Time: 1610-9604 PT Time Calculation (min) (ACUTE ONLY): 19 min   Charges:   PT Evaluation $PT Eval Moderate Complexity: 1 Mod   PT General Charges $$ ACUTE PT VISIT: 1 Visit         Emaline Handsome, PT, DPT 04/14/24, 9:56 AM   Venetta Gill 04/14/2024, 9:54 AM

## 2024-04-14 NOTE — Plan of Care (Signed)
°  Problem: Fluid Volume: °Goal: Hemodynamic stability will improve °Outcome: Progressing °  °Problem: Clinical Measurements: °Goal: Diagnostic test results will improve °Outcome: Progressing °Goal: Signs and symptoms of infection will decrease °Outcome: Progressing °  °

## 2024-04-14 NOTE — Progress Notes (Signed)
 PROGRESS NOTE  Todd Rojas    DOB: 12-Nov-1941, 83 y.o.  MWN:[id number]    Code Status: Limited: Do not attempt resuscitation (DNR) -DNR-LIMITED -Do Not Intubate/DNI    DOA: 04/11/2024   LOS: 2   Brief hospital course   Todd Rojas is a 83 y.o. 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  He reports that his ileostomy output is very liquid whereas previously it was more solid.  Also has left mid back pain that started in the past 24 hours.  ED course: Tmax 99 and tachycardic to 101 tachypneic to the low 20s BP 122/65 WBC 19,800 With lactic acid 1.9-->1.4 and procalcitonin less than 0.10 Respiratory viral panel negative GI panel and stool for C. difficile negative Urinalysis unremarkable CMP notable for hyponatremia of 130 Chest x-ray with no acute cardiopulmonary disease CT abdomen and pelvis:1. Stable moderate to marked severity posterior bibasilar atelectasis and/or infiltrate.  Preparing to dc on 4/25 when patient had orthostatic hypotension while standing to leave. Rapid response was called. Recovered fine once laid back down. Continues to be hypotensive despite IVF and midodrine .  Left flank pain is intermittent and colicky. Overnight coverage started meropenem.  He continues to do well while resting and mild orthostatic symptoms persist despite fluid resuscitation and midodrine . UxCx positive for klebsiella. 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 No urinary symptoms, no CT evidence, neg chest xray, neg procal, normal LA. No fever. Klebsiella UTI- No hydronephrosis or signs of pyelonephritis on CT. - urine culture has since resulted in positive GNR. Update: klebsiella  - overnight coverage started meropenem. Can downgrade once sensitivities return. Continues to deny urinary symptoms.   Abdominal pain- intermittent. Colicky. Resolved today. Suspect related to post-op CO2, digestive gas - heating pad, voltaren  gel, tylenol   Orthostatic hypotension- related to hypovolemia. Troponins 43>32. S/p IV fluids - continue to hold home antihypertensives - continue midodrine  - cardiology consulted per family request - thyroid  panel, had incidental nodule on imaging  AKI- related to hypotension, UTI- improving.  - follow BMP. Received IV fluids   Increased ileostomy output (HCC) S/p ileostomy 03/20/2024 secondary to colonic ileus Negative GI panel and C. Difficile Intake and output monitoring   CAD (coronary artery disease) Continue aspirin , rosuvastatin   Lisinopril , amlodipine  held No complaints of chest pain and EKG nonacute   COPD (chronic obstructive pulmonary disease) (HCC) Not acutely exacerbated DuoNebs as needed Continue home inhaler   Hyperlipidemia, unspecified Continue rosuvastatin   Thyroid  nodule greater than or equal to 1.5 cm in diameter  incidentally noted on imaging study For outpatient workup   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 Outpatient workup, PSA correlation recommended by radiologist  Body mass index is 25.55 kg/m.  VTE ppx: enoxaparin  (LOVENOX ) injection 30 mg Start: 04/14/24 0800  Diet:     Diet   Diet regular Fluid consistency: Thin   Consultants: Cardiology   Subjective 04/14/24    Pt reports feeling dandy. Denies flank pain. Had some mild dizziness when he stood earlier which resolved as he continued to move. Was able to tolerate breakfast.    Objective   Vitals:   04/13/24 0600 04/13/24 0927 04/13/24 1338 04/13/24 1642  BP: (!) 89/50 (!) 82/43 (!) 100/48 (!) 91/52  Pulse:  61 73 71  Resp:  17 18 18   Temp:  98 F (36.7 C) 98 F (36.7 C) 98 F (36.7 C)  TempSrc:  Oral    SpO2:  95% 97% 93%  Weight:      Height:        Intake/Output Summary (Last 24 hours) at 04/14/2024 0727 Last data filed at 04/13/2024 1900 Gross per 24 hour  Intake 540 ml  Output 300 ml  Net 240 ml   Filed Weights   04/11/24 2338  Weight: 78.5 kg    Physical Exam:  General: awake, alert, NAD HEENT: atraumatic, clear conjunctiva, anicteric sclera, MMM, hard of hearing Respiratory: normal respiratory effort. Cardiovascular: quick capillary refill, normal S1/S2, RRR, no JVD, murmurs Gastrointestinal: soft, NT, ND. Colostomy bag with liquid dark stool and gas.  Nervous: A&O x3. no gross focal neurologic deficits, normal speech Extremities: moves all equally, no edema, normal tone. No pain with palpation of back where pain is located Skin: dry, intact, normal temperature, normal color. No rashes, lesions or ulcers on exposed skin Psychiatry: normal mood, congruent affect  Labs   I have personally reviewed the following labs and imaging studies CBC    Component Value Date/Time   WBC 12.4 (H) 04/14/2024 0525   RBC 3.02 (L) 04/14/2024 0525   HGB 9.8 (L) 04/14/2024 0525   HCT 30.1 (L) 04/14/2024 0525   PLT 212 04/14/2024  0525   MCV 99.7 04/14/2024 0525   MCH 32.5 04/14/2024 0525   MCHC 32.6 04/14/2024 0525   RDW 14.6 04/14/2024 0525   LYMPHSABS 0.8 04/11/2024 2349   MONOABS 1.3 (H) 04/11/2024 2349   EOSABS 0.0 04/11/2024 2349   BASOSABS 0.1 04/11/2024 2349      Latest Ref Rng & Units 04/14/2024    5:25 AM 04/13/2024    5:46 AM 04/12/2024    5:20 PM  BMP  Glucose 70 - 99 mg/dL 130  99  865   BUN 8 - 23 mg/dL 33  37  29   Creatinine 0.61 - 1.24 mg/dL 7.84  6.96  2.95   Sodium 135 - 145 mmol/L 131  133  130   Potassium 3.5 - 5.1 mmol/L 4.5  4.5  4.6   Chloride 98 - 111 mmol/L 107  107  102   CO2 22 - 32 mmol/L 20  19  22    Calcium  8.9 - 10.3 mg/dL 8.1  7.9  8.4     No results found.   Disposition Plan & Communication  Patient status: Inpatient  Admitted From: Home Planned disposition location: Home Anticipated discharge date: 4/28 pending BP stability  Family Communication: wife at bedside    Author: Ree Candy, DO [facility] 04/14/2024,  7:27 AM   Available by Epic secure chat 7AM-7PM. If 7PM-7AM, please contact night-coverage.  [facility] contact information found on [email or web address].

## 2024-04-15 DIAGNOSIS — I951 Orthostatic hypotension: Secondary | ICD-10-CM | POA: Diagnosis not present

## 2024-04-15 DIAGNOSIS — B9689 Other specified bacterial agents as the cause of diseases classified elsewhere: Secondary | ICD-10-CM | POA: Diagnosis not present

## 2024-04-15 DIAGNOSIS — N390 Urinary tract infection, site not specified: Secondary | ICD-10-CM | POA: Diagnosis not present

## 2024-04-15 LAB — BASIC METABOLIC PANEL WITH GFR
Anion gap: 7 (ref 5–15)
BUN: 22 mg/dL (ref 8–23)
CO2: 22 mmol/L (ref 22–32)
Calcium: 8.5 mg/dL — ABNORMAL LOW (ref 8.9–10.3)
Chloride: 106 mmol/L (ref 98–111)
Creatinine, Ser: 1.09 mg/dL (ref 0.61–1.24)
GFR, Estimated: >60 mL/min (ref >60)
Glucose, Bld: 96 mg/dL (ref 70–99)
Potassium: 4.1 mmol/L (ref 3.5–5.1)
Sodium: 135 mmol/L (ref 135–145)

## 2024-04-15 LAB — CBC
HCT: 30.2 % — ABNORMAL LOW (ref 39.0–52.0)
Hemoglobin: 10.2 g/dL — ABNORMAL LOW (ref 13.0–17.0)
MCH: 32.6 pg (ref 26.0–34.0)
MCHC: 33.8 g/dL (ref 30.0–36.0)
MCV: 96.5 fL (ref 80.0–100.0)
Platelets: 246 10*3/uL (ref 150–400)
RBC: 3.13 MIL/uL — ABNORMAL LOW (ref 4.22–5.81)
RDW: 14.2 % (ref 11.5–15.5)
WBC: 7.7 10*3/uL (ref 4.0–10.5)
nRBC: 0 % (ref 0.0–0.2)

## 2024-04-15 MED ORDER — SULFAMETHOXAZOLE-TRIMETHOPRIM 800-160 MG PO TABS
1.0000 | ORAL_TABLET | Freq: Two times a day (BID) | ORAL | Status: AC
  Administered 2024-04-15 – 2024-04-17 (×6): 1 via ORAL
  Filled 2024-04-15 (×6): qty 1

## 2024-04-15 NOTE — Progress Notes (Signed)
 PROGRESS NOTE  Todd Rojas    DOB: [birth year], 83 y.o.  ZOX:[id number]    Code Status: Limited: Do not attempt resuscitation (DNR) -DNR-LIMITED -Do Not Intubate/DNI    DOA: 04/11/2024   LOS: 3   Brief hospital course   Todd Rojas is a 83 y.o. 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  He reports that his ileostomy output is very liquid whereas previously it was more solid.  Also has left mid back pain that started in the past 24 hours.  ED course: Tmax 99 and tachycardic to 101 tachypneic to the low 20s BP 122/65 WBC 19,800 With lactic acid 1.9-->1.4 and procalcitonin less than 0.10 Respiratory viral panel negative GI panel and stool for C. difficile negative Urinalysis unremarkable CMP notable for hyponatremia of 130 Chest x-ray with no acute cardiopulmonary disease CT abdomen and pelvis:1. Stable moderate to marked severity posterior bibasilar atelectasis and/or infiltrate.  Preparing to dc on 4/25 when patient had orthostatic hypotension while standing to leave. Rapid response was called. Recovered fine once laid back down. Continues to be hypotensive despite IVF and midodrine .  Left flank pain is intermittent and colicky. Overnight coverage started meropenem.  He continues to do well while resting and mild orthostatic symptoms persist despite fluid resuscitation and midodrine . UxCx positive for klebsiella. 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 No urinary symptoms, no CT evidence, neg chest xray, neg procal, normal LA. No fever. Klebsiella UTI- No hydronephrosis or signs of pyelonephritis on CT. - urine culture has since resulted in positive GNR. Update: klebsiella  - overnight coverage started meropenem. Now updated to bactrim  per sensitivities. Continues to deny urinary symptoms.   Abdominal pain- intermittent. Colicky. Resolved today. Suspect related to post-op CO2, digestive gas - heating pad, voltaren  gel, tylenol  - simethicone  PRN  Orthostatic hypotension- related to hypovolemia. Troponins 43>32. S/p IV fluids Improving. Not symptomatic today with position changes. Will get repeat orthostatics prior to dc - continue to hold home antihypertensives - continue midodrine  - cardiology consulted per family request - thyroid  panel, had incidental nodule on imaging  AKI- related to hypotension, UTI- resolved - follow BMP. Received IV fluids   Increased ileostomy output (HCC) S/p ileostomy 03/20/2024 secondary to colonic ileus Negative GI panel and C. Difficile Intake and output monitoring   CAD (coronary artery disease) Continue aspirin , rosuvastatin   Lisinopril , amlodipine  held No complaints of chest pain and EKG nonacute Cardiology has signed off   COPD (chronic obstructive pulmonary disease) (  HCC) Not acutely exacerbated DuoNebs as needed Continue home inhaler    Hyperlipidemia, unspecified Continue rosuvastatin    Thyroid  nodule greater than or equal to 1.5 cm in diameter incidentally noted on imaging study For outpatient workup   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 Outpatient workup, PSA correlation recommended by radiologist  Body mass index is 25.55 kg/m.  VTE ppx: enoxaparin  (LOVENOX ) injection 40 mg Start: 04/15/24 0800  Diet:     Diet   Diet regular Fluid consistency: Thin   Consultants: Cardiology   Subjective 04/15/24    Pt reports doing alright. Denies pain currently. No longer having dizziness with standing. Still intermittently will have stitch on the left side. He doesn't feel like he can go home today but doesn't give a specific reason.    Objective   Vitals:   04/14/24 1252 04/14/24 1701 04/14/24 2009 04/15/24 0207  BP: (!) 112/54 (!) 139/57 127/60 (!) 103/54  Pulse: 63 78 66 (!) 52  Resp: 20 17 17 17   Temp: 98 F (36.7 C) 98.9 F (37.2 C) 98.2 F (36.8 C) 98.5 F (36.9 C)  TempSrc:  Oral Oral Axillary  SpO2: 95% 93% 91% 95%  Weight:      Height:        Intake/Output Summary (Last 24 hours) at 04/15/2024 0981 Last data filed at 04/15/2024 0120 Gross per 24 hour  Intake 120 ml  Output 750 ml  Net -630 ml   Filed Weights   04/11/24 2338  Weight: 78.5 kg    Physical Exam:  General: awake, alert, NAD HEENT: atraumatic, clear conjunctiva, anicteric sclera, MMM, hard of hearing Respiratory: normal respiratory effort. Cardiovascular: quick capillary refill, normal S1/S2, RRR, no JVD, murmurs Gastrointestinal: soft, NT, ND. Colostomy bag with liquid dark stool and gas.  Nervous: A&O x3. no gross focal neurologic deficits, normal speech Extremities: moves all equally, no edema, normal tone. No pain with palpation of back where pain is located Skin: dry, intact, normal temperature, normal color. No rashes, lesions or ulcers on exposed skin Psychiatry: normal mood, congruent affect  Labs   I have personally reviewed  the following labs and imaging studies CBC    Component Value Date/Time   WBC 7.7 04/15/2024 0555   RBC 3.13 (L) 04/15/2024 0555   HGB 10.2 (L) 04/15/2024 0555   HCT 30.2 (L) 04/15/2024 0555   PLT 246 04/15/2024 0555   MCV 96.5 04/15/2024 0555   MCH 32.6 04/15/2024 0555   MCHC 33.8 04/15/2024 0555   RDW 14.2 04/15/2024 0555   LYMPHSABS 0.8 04/11/2024 2349   MONOABS 1.3 (H) 04/11/2024 2349   EOSABS 0.0 04/11/2024 2349   BASOSABS 0.1 04/11/2024 2349      Latest Ref Rng & Units 04/15/2024    5:55 AM 04/14/2024    5:25 AM 04/13/2024    5:46 AM  BMP  Glucose 70 - 99 mg/dL 96  191  99   BUN 8 - 23 mg/dL 22  33  37   Creatinine 0.61 - 1.24 mg/dL 4.78  2.95  6.21   Sodium 135 - 145 mmol/L 135  131  133   Potassium 3.5 - 5.1 mmol/L 4.1  4.5  4.5   Chloride 98 - 111 mmol/L 106  107  107   CO2 22 - 32 mmol/L 22  20  19    Calcium  8.9 - 10.3 mg/dL 8.5  8.1  7.9     No results found.   Disposition Plan & Communication  Patient status: Inpatient  Admitted  From: Home Planned disposition location: Home Anticipated discharge date: 4/29 pending clinical course  Family Communication: wife at bedside    Author: Ree Candy, DO [facility] 04/15/2024, 7:22 AM   Available by Epic secure chat 7AM-7PM. If 7PM-7AM, please contact night-coverage.  [facility] contact information found on [email or web address].

## 2024-04-15 NOTE — Progress Notes (Signed)
 [facility] CLINIC CARDIOLOGY PROGRESS NOTE   Patient ID: Todd Rojas MRN: [id number] DOB/AGE: 09-16-1941 83 y.o.  Admit date: 04/11/2024 Referring Physician Dr. Evette Hoes Primary Physician Antonio Baumgarten, MD  Primary Cardiologist Maryfrances Snell, [place] ([facility]) Reason for Consultation hypotension  HPI: Jerquan Bleyer is a 83 y.o. male with a past medical history of CAD (2006 Anterior STEMI-LAD PCI, 2010 LAD PCI, 6/202 LCX, LAD and OM1 PCI), chronic HFrEF, HTN and dyslipidemia who presented to the ED on 04/11/2024 for fever, vomiting. During admission he was noted to be hypotensive with associated dizziness. Cardiology was consulted.  Interval History:  -Patient seen and examined this AM, resting in bed with wife at bedside.  -Reports feeling slightly better today than yesterday, still feeling fatigued with poor appetite.  -Dizziness overall improved, walked around yesterday without any limitations.  -BP improved with IV fluids.  Creatinine normalized today.  Review of systems complete and found to be negative unless listed above    Vitals:   04/14/24 1701 04/14/24 2009 04/15/24 0207 04/15/24 0859  BP: (!) 139/57 127/60 (!) 103/54 119/62  Pulse: 78 66 (!) 52 72  Resp: 17 17 17 20   Temp: 98.9 F (37.2 C) 98.2 F (36.8 C) 98.5 F (36.9 C) 97.9 F (36.6 C)  TempSrc: Oral Oral Axillary Oral  SpO2: 93% 91% 95% 93%  Weight:      Height:         Intake/Output Summary (Last 24 hours) at 04/15/2024 1008 Last data filed at 04/15/2024 0120 Gross per 24 hour  Intake 0 ml  Output 450 ml  Net -450 ml     PHYSICAL EXAM General: Chronically ill-appearing elderly male, well nourished, in no acute distress. HEENT: Normocephalic and atraumatic. Neck: No JVD.  Lungs: Normal respiratory effort on room air. Clear bilaterally to auscultation. No wheezes, crackles, rhonchi.  Heart: HRRR. Normal S1 and S2 without gallops or murmurs. Radial & DP pulses 2+ bilaterally. Abdomen:  Non-distended appearing.  Msk: Normal strength and tone for age. Extremities: No clubbing, cyanosis or edema.   Neuro: Alert and oriented X 3. Psych: Mood appropriate, affect congruent.    LABS: Basic Metabolic Panel: Recent Labs    04/13/24 0546 04/14/24 0525 04/15/24 0555  NA 133* 131* 135  K 4.5 4.5 4.1  CL 107 107 106  CO2 19* 20* 22  GLUCOSE 99 103* 96  BUN 37* 33* 22  CREATININE 2.68* 1.48* 1.09  CALCIUM  7.9* 8.1* 8.5*  MG 1.6*  --   --    Liver Function Tests: No results for input(s): "AST", "ALT", "ALKPHOS", "BILITOT", "PROT", "ALBUMIN " in the last 72 hours. No results for input(s): "LIPASE", "AMYLASE" in the last 72 hours. CBC: Recent Labs    04/14/24 0525 04/15/24 0555  WBC 12.4* 7.7  HGB 9.8* 10.2*  HCT 30.1* 30.2*  MCV 99.7 96.5  PLT 212 246   Cardiac Enzymes: Recent Labs    04/13/24 0546 04/13/24 0921  TROPONINIHS 43* 32*   BNP: No results for input(s): "BNP" in the last 72 hours. D-Dimer: No results for input(s): "DDIMER" in the last 72 hours. Hemoglobin A1C: No results for input(s): "HGBA1C" in the last 72 hours. Fasting Lipid Panel: No results for input(s): "CHOL", "HDL", "LDLCALC", "TRIG", "CHOLHDL", "LDLDIRECT" in the last 72 hours. Thyroid  Function Tests: No results for input(s): "TSH", "T4TOTAL", "T3FREE", "THYROIDAB" in the last 72 hours.  Invalid input(s): "FREET3" Anemia Panel: No results for input(s): "VITAMINB12", "FOLATE", "FERRITIN", "TIBC", "IRON", "RETICCTPCT" in the last 72  hours.  No results found.   ECHO 02/2024: 1. Left ventricular ejection fraction, by estimation, is 40%. The left ventricle has moderately decreased function. The left ventricle demonstrates regional wall motion abnormalities (see scoring diagram/findings for description). There is mild left ventricular hypertrophy. Left ventricular diastolic parameters are  consistent with Grade I diastolic dysfunction (impaired relaxation).   2. Right ventricular  systolic function is normal. The right ventricular  size is normal.   3. The mitral valve is normal in structure. Trivial mitral valve regurgitation.   4. The aortic valve is tricuspid. There is mild thickening of the aortic  valve. Aortic valve regurgitation is not visualized.   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 male with a past medical history of CAD (2006 Anterior STEMI-LAD PCI, 2010 LAD PCI, 6/202 LCX, LAD and OM1 PCI), chronic HFrEF, HTN and dyslipidemia who presented to the ED on 04/11/2024 for fever, vomiting. During admission he was noted to be hypotensive with associated dizziness. Cardiology was consulted.  # Orthostatic hypotension # Coronary artery disease # S/p ileostomy, Ogilvie syndrome Patient with known coronary disease and recent hospitalization for Ogilvie's syndrome status post ex lap with ileostomy presented to the ED with fever and vomiting.  Also had hypotension and was given IV fluids for volume resuscitation. -Continue Crestor  20 mg daily, aspirin  81 mg daily. -Continue midodrine  10 mg 3 times daily.  Wean dose as BP improves. -Volume status and renal function appear quite improved with IV fluids, no recurrence  of dizziness and blood pressure now stable.  -Encouraged increase p.o. intake.  Consider compression stockings.  Cardiology will sign off. Please haiku with questions or re-engage if needed.   now stable.  This patient's case was discussed and created with Dr. Parks Bollman and he is in agreement.  Signed:  Hamp Levine, PA-C  04/15/2024, 10:08 AM [facility]

## 2024-04-15 NOTE — Progress Notes (Signed)
 Physical Therapy Treatment Patient Details Name: Todd Rojas MRN: [id number] DOB: 1941-11-17 Today's Date: 04/15/2024   History of Present Illness Pt is an 83 y/o M admitted on 04/11/24. Pt with recent 1 month long admission. Pt presents with c/o fever & vomiting. PMH: CAD, COPD, HTN, ILD, bronchiectasis, COPD    PT Comments  Patient reports that he walked a lap with assistance in hallway and sat up in the chair for an hour already. He declined getting back up but was agreeable to in bed exercises for strengthening. Encouraged mobility as tolerated with staff assistance to promote upright conditioning, strengthening, and independence with activity. PT will continue to follow.    If plan is discharge home, recommend the following: Assist for transportation;Help with stairs or ramp for entrance;A little help with bathing/dressing/bathroom;A little help with walking and/or transfers;Assistance with cooking/housework   Can travel by private vehicle        Equipment Recommendations  None recommended by PT    Recommendations for Other Services       Precautions / Restrictions Precautions Precautions: Fall Recall of Precautions/Restrictions: Intact Precaution/Restrictions Comments: watch BP Restrictions Weight Bearing Restrictions Per Provider Order: No     Mobility  Bed Mobility               General bed mobility comments: patient declined as he reports walking a lap in the hallway and sitting up in the chair for an hour already. he was agreeable to in bed exercises for strengthening    Transfers                        Ambulation/Gait                   Stairs             Wheelchair Mobility     Tilt Bed    Modified Rankin (Stroke Patients Only)       Balance                                            Communication Communication Communication: Impaired Factors Affecting Communication: Hearing impaired   Cognition Arousal: Alert Behavior During Therapy: WFL for tasks assessed/performed   PT - Cognitive impairments: No apparent impairments                         Following commands: Intact      Cueing Cueing Techniques: Verbal cues, Tactile cues  Exercises General Exercises - Lower Extremity Ankle Circles/Pumps: AROM, Strengthening, Both, 10 reps, Supine Heel Slides: AAROM, Strengthening, Both, 5 reps, Supine Hip ABduction/ADduction: AAROM, Strengthening, Both, 5 reps, Supine Straight Leg Raises: AAROM, Strengthening, Both, 5 reps, Supine Other Exercises Other Exercises: verbal and visual cues for exercise technique for strengthening    General Comments General comments (skin integrity, edema, etc.): exercises performed to increase strength for functional mobility      Pertinent Vitals/Pain Pain Assessment Pain Assessment: No/denies pain    Home Living                          Prior Function            PT Goals (current goals can now be found in the care plan section) Acute Rehab PT Goals Patient Stated Goal:  get better, go home PT Goal Formulation: With patient/family Time For Goal Achievement: 04/28/24 Potential to Achieve Goals: Good Progress towards PT goals: Progressing toward goals    Frequency    Min 2X/week      PT Plan      Co-evaluation              AM-PAC PT "6 Clicks" Mobility   Outcome Measure  Help needed turning from your back to your side while in a flat bed without using bedrails?: None Help needed moving from lying on your back to sitting on the side of a flat bed without using bedrails?: A Little Help needed moving to and from a bed to a chair (including a wheelchair)?: A Little Help needed standing up from a chair using your arms (e.g., wheelchair or bedside chair)?: A Little Help needed to walk in hospital room?: A Little Help needed climbing 3-5 steps with a railing? : A Little 6 Click Score: 19    End  of Session   Activity Tolerance: Patient limited by fatigue Patient left: in bed;with call bell/phone within reach;with bed alarm set   PT Visit Diagnosis: Other abnormalities of gait and mobility (R26.89);Muscle weakness (generalized) (M62.81)     Time: 5409-8119 PT Time Calculation (min) (ACUTE ONLY): 9 min  Charges:    $Therapeutic Exercise: 8-22 mins PT General Charges $$ ACUTE PT VISIT: 1 Visit                     Ozie Bo, PT, MPT    Erlene Hawks 04/15/2024, 3:21 PM

## 2024-04-15 NOTE — TOC Progression Note (Signed)
 Transition of Care [facility]) - Progression Note    Patient Details  Name: Todd Rojas MRN: [id number] Date of Birth: [birth year]  Transition of Care [facility]) CM/SW Contact  Odilia Bennett, LCSW Phone Number: 04/15/2024, 2:33 PM  Clinical Narrative:   Patient declined 3-in-1.  Expected Discharge Plan: Home w Home Health Services Barriers to Discharge: Barriers Resolved  Expected Discharge Plan and Services       Living arrangements for the past 2 months: Single Family Home Expected Discharge Date: 04/12/24                         HH Arranged: RN, PT, OT [facility] Agency: [facility] Date [facility] Agency Contacted: 04/12/24   Representative spoke with at [facility] Agency: [place]    Social Determinants of Health (SDOH) Interventions SDOH Screenings   Food Insecurity: No Food Insecurity (04/12/2024)  Housing: Patient Declined (04/12/2024)  Transportation Needs: No Transportation Needs (04/12/2024)  Utilities: Not At Risk (04/12/2024)  Depression (PHQ2-9): Low Risk  (07/12/2021)  Financial Resource Strain: Low Risk  (10/23/2023)   Received from [facility]  Social Connections: Unknown (04/12/2024)  Tobacco Use: Medium Risk (04/11/2024)    Readmission Risk Interventions     No data to display

## 2024-04-16 ENCOUNTER — Inpatient Hospital Stay

## 2024-04-16 DIAGNOSIS — I951 Orthostatic hypotension: Secondary | ICD-10-CM | POA: Diagnosis not present

## 2024-04-16 DIAGNOSIS — N390 Urinary tract infection, site not specified: Secondary | ICD-10-CM | POA: Diagnosis not present

## 2024-04-16 DIAGNOSIS — B9689 Other specified bacterial agents as the cause of diseases classified elsewhere: Secondary | ICD-10-CM | POA: Diagnosis not present

## 2024-04-16 LAB — THYROID PANEL WITH TSH
Free Thyroxine Index: 1.7 (ref 1.2–4.9)
T3 Uptake Ratio: 34 % (ref 24–39)
T4, Total: 5 ug/dL (ref 4.5–12.0)
TSH: 2.96 u[IU]/mL (ref 0.450–4.500)

## 2024-04-16 LAB — CULTURE, BLOOD (ROUTINE X 2)
Culture: NO GROWTH
Culture: NO GROWTH

## 2024-04-16 LAB — PROTIME-INR
INR: 1.2 (ref 0.8–1.2)
Prothrombin Time: 15.5 s — ABNORMAL HIGH (ref 11.4–15.2)

## 2024-04-16 MED ORDER — APIXABAN 5 MG PO TABS
10.0000 mg | ORAL_TABLET | Freq: Two times a day (BID) | ORAL | Status: DC
  Administered 2024-04-16 – 2024-04-19 (×6): 10 mg via ORAL
  Filled 2024-04-16 (×6): qty 2

## 2024-04-16 MED ORDER — APIXABAN 5 MG PO TABS: 5.0000 mg | ORAL_TABLET | Freq: Two times a day (BID) | ORAL | Status: DC

## 2024-04-16 MED ORDER — IOHEXOL 300 MG/ML  SOLN
75.0000 mL | Freq: Once | INTRAMUSCULAR | Status: AC | PRN
  Administered 2024-04-16: 75 mL via INTRAVENOUS

## 2024-04-16 NOTE — Progress Notes (Signed)
 Occupational Therapy Treatment Patient Details Name: Todd Rojas MRN: [id number] DOB: Jan 10, 1941 Today's Date: 04/16/2024   History of present illness Pt is an 83 y/o M admitted on 04/11/24. Pt with recent 1 month long admission. Pt presents with c/o fever & vomiting. PMH: CAD, COPD, HTN, ILD, bronchiectasis, COPD   OT comments  Chart reviewed to date, pt greeted in chair, wife present. Pt reports 10/10 pain on L flank, intermittent but shooting and very painful when it does happen. Pt wife reports concerns regarding his continued weakness, interested in considering alternate discharge options to improve ADL performance. Pt is orthostatic and appears symptomatic therefore away from chair mobility deferred and pt had already mobilized with NT prior to OT session. Pt is +orthostatic, please refer to vitals flow sheet. MOD A required for underwear management. Pt reports he feels weak and fatigued. Continued education provided re: importance of eating/drinking. Team notified of findings via secure chat. Pt is left in chair, all needs met. OT will continue to follow.       If plan is discharge home, recommend the following:  A little help with walking and/or transfers;A little help with bathing/dressing/bathroom;Assistance with cooking/housework;Assist for transportation;Help with stairs or ramp for entrance   Equipment Recommendations  BSC/3in1;Tub/shower seat    Recommendations for Other Services      Precautions / Restrictions Precautions Precautions: Fall Recall of Precautions/Restrictions: Intact Precaution/Restrictions Comments: watch BP       Mobility Bed Mobility               General bed mobility comments: NT, in recliner pre/post session; has just walked a lap with NT but said he felt really weak during his walk    Transfers Overall transfer level: Needs assistance Equipment used: Rolling walker (2 wheels) Transfers: Sit to/from Stand Sit to Stand: Contact  guard assist, Min assist                 Balance Overall balance assessment: Needs assistance Sitting-balance support: Feet supported, Bilateral upper extremity supported Sitting balance-Leahy Scale: Good     Standing balance support: During functional activity, Bilateral upper extremity supported, Reliant on assistive device for balance Standing balance-Leahy Scale: Fair                             ADL either performed or assessed with clinical judgement   ADL Overall ADL's : Needs assistance/impaired                     Lower Body Dressing: Sit to/from stand;Moderate assistance Lower Body Dressing Details (indicate cue type and reason): donn/doff underwear; pt with limitations in pain as well as strength Toilet Transfer: Contact guard assist;Minimal assistance;Rolling walker (2 wheels) Toilet Transfer Details (indicate cue type and reason): simulated                Extremity/Trunk Assessment              Vision       Perception     Praxis     Communication Communication Communication: Impaired Factors Affecting Communication: Hearing impaired   Cognition Arousal: Alert Behavior During Therapy: WFL for tasks assessed/performed Cognition: No apparent impairments                                        Cueing  Cueing Techniques: Verbal cues, Tactile cues  Exercises Other Exercises Other Exercises: edu re: role of OT, role of rehab, discussed discharge recommendations, importance of eating/drinking at length    Shoulder Instructions       General Comments      Pertinent Vitals/ Pain       Pain Assessment Pain Assessment: 0-10 Pain Score: 10-Worst pain ever Pain Location: L side of his abdomen Pain Descriptors / Indicators: Sharp Pain Intervention(s): Monitored during session, Repositioned, Limited activity within patient's tolerance  Home Living                                           Prior Functioning/Environment              Frequency  Min 2X/week        Progress Toward Goals  OT Goals(current goals can now be found in the care plan section)  Progress towards OT goals: Progressing toward goals  Acute Rehab OT Goals Time For Goal Achievement: 04/28/24  Plan      Co-evaluation                 AM-PAC OT "6 Clicks" Daily Activity     Outcome Measure   Help from another person eating meals?: None Help from another person taking care of personal grooming?: None Help from another person toileting, which includes using toliet, bedpan, or urinal?: A Little Help from another person bathing (including washing, rinsing, drying)?: A Little Help from another person to put on and taking off regular upper body clothing?: A Little Help from another person to put on and taking off regular lower body clothing?: A Lot 6 Click Score: 19    End of Session Equipment Utilized During Treatment: Rolling walker (2 wheels)  OT Visit Diagnosis: Other abnormalities of gait and mobility (R26.89)   Activity Tolerance Patient tolerated treatment well   Patient Left in chair;with call bell/phone within reach;with chair alarm set   Nurse Communication Mobility status        Time: 1002-1040 OT Time Calculation (min): 38 min  Charges: OT General Charges $OT Visit: 1 Visit OT Treatments $Self Care/Home Management : 8-22 mins $Therapeutic Activity: 23-37 mins  Gerre Kraft, OTD OTR/L  04/16/24, 1:15 PM

## 2024-04-16 NOTE — Progress Notes (Signed)
 PHARMACY - ANTICOAGULATION CONSULT NOTE  Pharmacy Consult for apixaban Indication: pulmonary embolus  No Known Allergies  Patient Measurements: Height: 5\' 9"  (175.3 cm) Weight: 78.5 kg (173 lb) IBW/kg (Calculated) : 70.7 HEPARIN  DW (KG): 78.5  Vital Signs: Temp: 97.9 F (36.6 C) (04/29 1536) Temp Source: Oral (04/29 1536) BP: 112/72 (04/29 1536) Pulse Rate: 63 (04/29 1536)  Labs: Recent Labs    04/14/24 0525 04/15/24 0555  HGB 9.8* 10.2*  HCT 30.1* 30.2*  PLT 212 246  CREATININE 1.48* 1.09    Estimated Creatinine Clearance: 51.3 mL/min (by C-G formula based on SCr of 1.09 mg/dL).   Medical History: Past Medical History:  Diagnosis Date   Actinic keratosis    Arthritis    Asthma    Basal cell carcinoma 03/17/2020   right ant lat deltoid   CAD (coronary artery disease) 02/13/2012   Overview:   12/2004 - Anginal equivalent symptoms:  Left arm pain, weakness, and shortness of breath.   12/2004:  Anterior STEMI.  Transferred to [facility].   01/06/2005 - Cardiac cath:  EF 51%, an occluded mid LAD and a large OM2 with 75% ostial stenosis.  PCI of the mid occluded LAD using a Cypher stent.   03/2005 - 2D echo:  EF >55%, LAE 4.7 cm, no significant valvular disease.   02/2008:  Plavix stopped.   06/2008:  Recurrent angina - symptoms of chest tightness.  Presented to [facility]  Cardiac catheterization.  The patient stated nonobstructive CAD seen.  Plavix restarted at that time.   03/2009 - 2D echo:  EF 35%, akinetic apex and septum.  Stress echocardiogram deferred for cardiac catheterization.   03/30/2009:  PCI 90% proximal LAD with a 3.0 x 18 mm Xience DES.   02/2010: Slow and gradual return of chest tightness, fatigue and generalized weakness concerning for return of anginal equivalent.  Stress test did not show ischemia to the heart muscle during exercise.  Evidenc   CHF (congestive heart failure) (HCC)    Chronic kidney disease    kidney stone left side    COPD (chronic obstructive pulmonary disease) (HCC) 07/03/2017   Dyslipidemia 02/13/2012   Overview:   03/2009  TC 158, TG 88, HDL 39, LDL 102.  11/2013  TC 148, TG 115, HDL 44.3, LDL 80.7 (done at PCP's office)   10/2014 TC 146  11/2015 TC 123, TG 86, HDL 41.7, LDL 64  11/2016 TC 140, TG 129, HDL 40.1, LDL 74    Dyspnea    GERD (gastroesophageal reflux disease)    GI disease 03/09/2013   Overview:   Dysphagia - EGD demonstrated esophageal stricture which was dilated   GERD   Hemorrhoids    Hearing loss 03/09/2013   History of kidney stones    Hyperlipidemia, unspecified 07/03/2017   Hypertension 02/13/2012   Myocardial infarction [facility]) 2004   Personal history of gout 12/09/2016   Primary osteoarthritis of both knees 05/05/2015   Right-sided low back pain without sciatica 04/14/2016   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   R ear - ED&C   Assessment: 83 y/o male presenting with severe sepsis. PMH significant for CAD, COPD, HTN , interstitial pulmonary disease, bronchiectasis, COPD. Repeat CT imaging shows new pulmonary embolism in left lower lobe. Pharmacy has been consulted to initiate apixaban.  Last dose of prophylactic enoxaparin : 40 mg SQ on 04/16/24 at 0959 Baseline labs: hgb 10.2, plt 246, INR pending  Goal of Therapy:  Monitor platelets by anticoagulation protocol: Yes  Plan:  Stop enoxaparin  40 mg SQ every 24 hours Start apixaban 10 mg PO twice daily for 7 days, then transition to apixaban 5 mg PO twice daily Monitor CBC and Scr at least weekly per protocol Monitor signs/symptoms of bleeding  Thank you for involving pharmacy in this patient's care.   Ananias Balls, PharmD Clinical Pharmacist 04/16/2024 8:31 PM

## 2024-04-16 NOTE — Progress Notes (Addendum)
 PROGRESS NOTE  Todd Rojas    DOB: May 16, 1941, 83 y.o.  WUJ:[id number]    Code Status: Limited: Do not attempt resuscitation (DNR) -DNR-LIMITED -Do Not Intubate/DNI    DOA: 04/11/2024   LOS: 3   Brief hospital course   [name] is a 83 y.o. 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  He reports that his ileostomy output is very liquid whereas previously it was more solid.  Also has left mid back pain that started in the past 24 hours.  ED course: Tmax 99 and tachycardic to 101 tachypneic to the low 20s BP 122/65 WBC 19,800 With lactic acid 1.9-->1.4 and procalcitonin less than 0.10 Respiratory viral panel negative GI panel and stool for C. difficile negative Urinalysis unremarkable CMP notable for hyponatremia of 130 Chest x-ray with no acute cardiopulmonary disease CT abdomen and pelvis:1. Stable moderate to marked severity posterior bibasilar atelectasis and/or infiltrate.  Preparing to dc on 4/25 when patient had orthostatic hypotension while standing to leave. Rapid response was called. Recovered fine once laid back down. Continues to be hypotensive despite IVF and midodrine .  Left flank pain is intermittent and colicky. Overnight coverage started meropenem.  He continues to do well while resting and mild orthostatic symptoms persist despite fluid resuscitation and midodrine . UxCx positive for klebsiella. Remains on Abx and now transitioned to PO  4/29: patient doesn't feel well  enough to go home. Again he is unable to describe any specific reasons or symptoms. His blood pressures have improved. Cardiology has signed off. His blood cultures remain negative. Continues on bactrim  for UTI. Has intermittent colicky pain in upper left flank that did not have abnormality on abdominal CT. Family requests that he have re-imaged left flank and also wants to be re-evaluated for SNF as prior evaluations recommended HH.   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 No urinary symptoms and urinalysis was negative for signs of infection, no CT evidence, neg chest xray, neg procal, normal LA. No fever. Klebsiella UTI- No hydronephrosis or signs of pyelonephritis on CT. - urine culture has since resulted in positive GNR. Update: klebsiella  - overnight coverage started meropenem. Now updated to bactrim  per sensitivities. Continues to deny urinary symptoms.  Repeat imaging requested by family. Ordered chest CT to evaluate primarily LLL and diaphragm  UPDATE: small PE in LLL may be contributing to the pain. Respiratory status stable ORA. Considered provoked given recent surgery/hospitalization. Started eliquis x90 days with loading dose.   Abdominal pain- intermittent. Colicky. Worse at night. Suspect related  to post-op CO2, digestive gas - heating pad, voltaren  gel, tylenol  - simethicone  PRN  Orthostatic hypotension- related to hypovolemia. Troponins  43>32. S/p IV fluids Improving. Not symptomatic today with position changes. Will get repeat orthostatics prior to dc - continue to hold home antihypertensives - continue midodrine  - cardiology consulted per family request and has signed off - ted stockings - thyroid  panel pending, had incidental nodule on imaging  AKI- related to hypotension, UTI- resolved - follow BMP. Received IV fluids   Increased ileostomy output (HCC) S/p ileostomy 03/20/2024 secondary to colonic ileus. Outpatient follow up with surgery was scheduled for 5/5. Consider inpatient consult if not stable output Negative GI panel and C. 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  VTE ppx: enoxaparin  (LOVENOX ) injection 40 mg Start: 04/15/24 0800  Diet:     Diet   Diet regular Fluid consistency: Thin   Consultants: Cardiology   Subjective 04/16/24    Pt reports that he doesn't feel like he can go home. He is unable to give any further reasoning or symptom description. Denies chest pain or SOB. Denies abdominal pain currently but still has occasional "catches" on left flank. Denies dysuria or nausea. Did not feel lightheaded when got out of bed this morning.     Objective   Vitals:   04/15/24 0859 04/15/24 1638 04/15/24 1943 04/16/24 0246  BP:  119/62 128/66 124/72 121/60  Pulse: 72 68 69 75  Resp: 20 16 18 18   Temp: 97.9 F (36.6 C) 97.6 F (36.4 C) 98.3 F (36.8 C) 98.5 F (36.9 C)  TempSrc: Oral Oral    SpO2: 93% 91% 94% 92%  Weight:      Height:        Intake/Output Summary (Last 24 hours) at 04/16/2024 0751 Last data filed at 04/15/2024 1500 Gross per 24 hour  Intake 600 ml  Output 1200 ml  Net -600 ml   Filed Weights   04/11/24 2338  Weight: 78.5 kg    Physical Exam:  General: awake, alert, NAD HEENT: atraumatic, clear conjunctiva, anicteric sclera, MMM, hard of hearing Respiratory: normal respiratory effort. Cardiovascular: quick capillary refill, normal S1/S2, RRR, no JVD, murmurs Gastrointestinal: soft, NT, ND. Colostomy bag with minimal liquid dark stool and significant gas.  Nervous: A&O x3. no gross focal neurologic deficits, normal speech Extremities: moves all equally, no edema, normal tone. No pain with palpation of back where pain is located Skin: dry, intact, normal temperature, normal color. No rashes, lesions or ulcers on exposed skin Psychiatry: normal mood, congruent affect  Labs   I have personally reviewed the following labs and imaging studies CBC    Component Value Date/Time   WBC 7.7 04/15/2024 0555   RBC 3.13 (L) 04/15/2024 0555   HGB 10.2 (L) 04/15/2024 0555   HCT 30.2 (L) 04/15/2024 0555   PLT 246 04/15/2024 0555   MCV 96.5 04/15/2024 0555   MCH 32.6  04/15/2024 0555   MCHC 33.8 04/15/2024 0555   RDW 14.2 04/15/2024 0555   LYMPHSABS 0.8 04/11/2024 2349   MONOABS 1.3 (H) 04/11/2024 2349   EOSABS 0.0 04/11/2024 2349   BASOSABS 0.1 04/11/2024 2349      Latest Ref Rng & Units 04/15/2024    5:55 AM 04/14/2024    5:25 AM 04/13/2024    5:46 AM  BMP  Glucose 70 - 99 mg/dL 96  811  99   BUN 8 - 23 mg/dL 22  33  37   Creatinine 0.61 - 1.24 mg/dL 9.14  7.82  9.56   Sodium 135 - 145 mmol/L 135  131  133   Potassium 3.5 - 5.1 mmol/L 4.1  4.5  4.5   Chloride 98 - 111 mmol/L 106  107   107   CO2 22 - 32 mmol/L 22  20  19    Calcium  8.9 - 10.3 mg/dL 8.5  8.1  7.9     No results found.   Disposition Plan & Communication  Patient status: Inpatient  Admitted From: Home Planned disposition location: Home Anticipated discharge date: 4/30 pending clinical course  Family Communication: wife at bedside    Author: Ree Candy, DO [facility] 04/16/2024, 7:51 AM   Available by Epic secure chat 7AM-7PM. If 7PM-7AM, please contact night-coverage.  [facility] contact information found on [email or web address].

## 2024-04-17 ENCOUNTER — Telehealth (HOSPITAL_COMMUNITY): Payer: Self-pay | Admitting: Pharmacy Technician

## 2024-04-17 ENCOUNTER — Other Ambulatory Visit (HOSPITAL_COMMUNITY): Payer: Self-pay

## 2024-04-17 DIAGNOSIS — A419 Sepsis, unspecified organism: Secondary | ICD-10-CM | POA: Diagnosis not present

## 2024-04-17 LAB — CBC
HCT: 34.7 % — ABNORMAL LOW (ref 39.0–52.0)
Hemoglobin: 11.8 g/dL — ABNORMAL LOW (ref 13.0–17.0)
MCH: 31.8 pg (ref 26.0–34.0)
MCHC: 34 g/dL (ref 30.0–36.0)
MCV: 93.5 fL (ref 80.0–100.0)
Platelets: 287 10*3/uL (ref 150–400)
RBC: 3.71 MIL/uL — ABNORMAL LOW (ref 4.22–5.81)
RDW: 13.7 % (ref 11.5–15.5)
WBC: 6.9 10*3/uL (ref 4.0–10.5)
nRBC: 0 % (ref 0.0–0.2)

## 2024-04-17 LAB — BASIC METABOLIC PANEL WITH GFR
Anion gap: 10 (ref 5–15)
BUN: 14 mg/dL (ref 8–23)
CO2: 23 mmol/L (ref 22–32)
Calcium: 8.8 mg/dL — ABNORMAL LOW (ref 8.9–10.3)
Chloride: 98 mmol/L (ref 98–111)
Creatinine, Ser: 1.06 mg/dL (ref 0.61–1.24)
GFR, Estimated: >60 mL/min (ref >60)
Glucose, Bld: 88 mg/dL (ref 70–99)
Potassium: 4 mmol/L (ref 3.5–5.1)
Sodium: 131 mmol/L — ABNORMAL LOW (ref 135–145)

## 2024-04-17 NOTE — Telephone Encounter (Signed)
 Patient Product/process development scientist completed.    The patient is insured through [employer]. Patient has Medicare and is not eligible for a copay card, but may be able to apply for patient assistance or Medicare RX Payment Plan (Patient Must reach out to their plan, if eligible for payment plan), if available.    Ran test claim for Eliquis 5 mg and the current 30 day co-pay is $152.71.   This test claim was processed through [facility]- copay amounts may vary at other pharmacies due to pharmacy/plan contracts, or as the patient moves through the different stages of their insurance plan.     Roland Earl, CPHT Pharmacy Technician III Certified Patient Advocate [facility] Patient Advocate Team Direct Number: [phone]  Fax: [phone]

## 2024-04-17 NOTE — Progress Notes (Signed)
 PROGRESS NOTE  Todd Rojas    DOB: 03-18-1941, 83 y.o.  OZH:[id number]    Code Status: Limited: Do not attempt resuscitation (DNR) -DNR-LIMITED -Do Not Intubate/DNI    DOA: 04/11/2024   LOS: 4   Brief hospital course   Todd Rojas is a 83 y.o. 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  He reports that his ileostomy output is very liquid whereas previously it was more solid.  Also has left mid back pain that started in the past 24 hours.  ED course: Tmax 99 and tachycardic to 101 tachypneic to the low 20s BP 122/65 WBC 19,800 With lactic acid 1.9-->1.4 and procalcitonin less than 0.10 Respiratory viral panel negative GI panel and stool for C. difficile negative Urinalysis unremarkable CMP notable for hyponatremia of 130 Chest x-ray with no acute cardiopulmonary disease CT abdomen and pelvis:1. Stable moderate to marked severity posterior bibasilar atelectasis and/or infiltrate.  Preparing to dc on 4/25 when patient had orthostatic hypotension while standing to leave. Rapid response was called. Recovered fine once laid back down. Continues to be hypotensive despite IVF and midodrine .  Left flank pain is intermittent and colicky. Overnight coverage started meropenem.  He continues to do well while resting and mild orthostatic symptoms persist despite fluid resuscitation and midodrine . UxCx positive for klebsiella. Remains on Abx and now transitioned to PO  4/29: patient doesn't feel well  enough to go home. Again he is unable to describe any specific reasons or symptoms. His blood pressures have improved. Cardiology has signed off. His blood cultures remain negative. Continues on bactrim  for UTI. Has intermittent colicky pain in upper left flank that did not have abnormality on abdominal CT. Family requests that he have re-imaged left flank and also wants to be re-evaluated for SNF as prior evaluations recommended HH.   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 No urinary symptoms and urinalysis was negative for signs of infection, no CT evidence, neg chest xray, neg procal, normal LA. No fever. Klebsiella UTI- No hydronephrosis or signs of pyelonephritis on CT. Klebsiella - bactrim  per sensitivities.   small PE in LLL may be contributing to the pain. Respiratory status stable ORA. Considered provoked given recent surgery/hospitalization.  Started eliquis x90 days with loading dose.   L flank/rib pain- intermittent. Tender to touch. Worse at night. Suspect related to post-op CO2, digestive gas - heating pad, voltaren  gel, tylenol  - simethicone  PRN  Orthostatic hypotension- related to hypovolemia. Troponins  43>32. S/p IV fluids Improving. Not symptomatic today with position changes. Will get repeat orthostatics prior to dc - continue to hold home antihypertensives - continue  midodrine  - cardiology consulted per family request and has signed off - ted stockings  AKI- related to hypotension, UTI- resolved - follow BMP. Received IV fluids   Increased ileostomy output (HCC) S/p ileostomy 03/20/2024 secondary to colonic ileus. Outpatient follow up with surgery was scheduled for 5/5. Consider inpatient consult if not stable output Negative GI panel and C. Difficile Intake and output monitoring Has significant amount of gas requiring frequent "burping" of ileostomy bag Simethicone  PRN   CAD (coronary artery disease) Continue aspirin , rosuvastatin   Lisinopril , amlodipine  held No complaints of chest pain and EKG nonacute Cardiology has signed off   COPD (chronic obstructive pulmonary disease) Not acutely exacerbated DuoNebs as needed Continue home inhaler   Hyperlipidemia, unspecified Continue rosuvastatin    Thyroid  nodule greater than or equal to 1.5 cm in diameter incidentally noted on imaging study For outpatient workup   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 Outpatient workup, PSA correlation recommended by radiologist  Body mass index is 25.55 kg/m.  VTE ppx: Place TED hose Start: 04/17/24 0900 Place TED hose Start: 04/16/24 1303 apixaban (ELIQUIS) tablet 10 mg  apixaban (ELIQUIS) tablet 5 mg  Diet:     Diet   Diet regular Fluid consistency: Thin   Consultants: Cardiology   Subjective 04/17/24    Pt reports still having some cramping on L side, points to lateral lower ribs, NOT to abdominal flank . Reports fatigue a bit worse from yesterday     Objective   Vitals:   04/16/24 2047 04/17/24 0555 04/17/24 0805 04/17/24 1627  BP: 126/69 112/72 116/70 124/69  Pulse: 67 83 90 69  Resp: 18 20 17    Temp: 98.6 F (37 C) 98.4 F (36.9 C) 98.2 F (36.8 C) 98.3 F (36.8 C)  TempSrc: Oral Oral Oral   SpO2: 93% 92% 92% 94%  Weight:      Height:        Intake/Output Summary (Last 24 hours) at 04/17/2024 1751 Last data filed at 04/17/2024 1035 Gross  per 24 hour  Intake 120 ml  Output 650 ml  Net -530 ml   Filed Weights   04/11/24 2338  Weight: 78.5 kg    Physical Exam:  General: awake, alert, NA Respiratory: normal respiratory effort. Cardiovascular: normal S1/S2, RRR Gastrointestinal: soft, NT, ND. C Extremities: moves all equally, Skin: dry, intact Psychiatry: normal mood, congruent affect  Labs   I have personally reviewed the following labs and imaging studies CBC    Component Value Date/Time   WBC 6.9 04/17/2024 0614   RBC 3.71 (L) 04/17/2024 0614   HGB 11.8 (L) 04/17/2024 0614   HCT 34.7 (L) 04/17/2024 0614   PLT 287 04/17/2024 0614   MCV 93.5 04/17/2024 0614   MCH 31.8 04/17/2024 0614   MCHC 34.0 04/17/2024 0614   RDW 13.7 04/17/2024 0614   LYMPHSABS 0.8 04/11/2024 2349   MONOABS 1.3 (H) 04/11/2024 2349   EOSABS 0.0 04/11/2024 2349   BASOSABS 0.1 04/11/2024 2349      Latest Ref Rng & Units 04/17/2024    6:14 AM 04/15/2024    5:55 AM 04/14/2024    5:25 AM  BMP  Glucose 70 - 99 mg/dL 88  96  811   BUN 8 - 23 mg/dL 14  22  33   Creatinine 0.61 - 1.24 mg/dL 9.14  7.82  9.56  Sodium 135 - 145 mmol/L 131  135  131   Potassium 3.5 - 5.1 mmol/L 4.0  4.1  4.5   Chloride 98 - 111 mmol/L 98  106  107   CO2 22 - 32 mmol/L 23  22  20    Calcium  8.9 - 10.3 mg/dL 8.8  8.5  8.1     CT CHEST W CONTRAST Addendum Date: 04/16/2024 ADDENDUM REPORT: 04/16/2024 18:27 ADDENDUM: Critical Value/emergent results were called by telephone at the time of interpretation on 04/16/2024 at 6:25 pm to provider [facility] , who verbally acknowledged these results. Electronically Signed   By: Beula Brunswick M.D.   On: 04/16/2024 18:27   Result Date: 04/16/2024 CLINICAL DATA:  Chest pain, nonspecific. EXAM: CT CHEST WITH CONTRAST TECHNIQUE: Multidetector CT imaging of the chest was performed during intravenous contrast administration. RADIATION DOSE REDUCTION: This exam was performed according to the departmental dose-optimization  program which includes automated exposure control, adjustment of the mA and/or kV according to patient size and/or use of iterative reconstruction technique. CONTRAST:  75mL OMNIPAQUE  IOHEXOL  300 MG/ML  SOLN COMPARISON:  CT scan chest from 03/10/2024. FINDINGS: Cardiovascular: Normal cardiac size. No pericardial effusion. No aortic aneurysm. There are coronary artery calcifications, in keeping with coronary artery disease. There are also mild peripheral atherosclerotic vascular calcifications of thoracic aorta and its major branches. Please note, this examination was not ordered/performed as per the pulmonary embolism protocol; however, there is satisfactory opacification of pulmonary arteries. There is small volume occlusive embolism noted in the left lung lower lobe segmental pulmonary artery branch (series 2, images 90-92). There is no right heart strain or lung infarction. There is dilation of the main pulmonary trunk measuring up to 3.0 cm, which is nonspecific but can be seen with pulmonary artery hypertension. Mediastinum/Nodes: There is a 1.0 x 2.3 cm hypoattenuating nodule in the left thyroid  lobe, incompletely characterized on the current exam but grossly unchanged since the prior study. Please refer to ultrasound from 03/10/2024 for details. No solid / cystic mediastinal masses. The esophagus is nondistended precluding optimal assessment. There are few mildly prominent mediastinal and hilar lymph nodes, which do not meet the size criteria for lymphadenopathy and appear grossly similar to the prior study, favoring benign etiology. No axillary lymphadenopathy by size criteria. Lungs/Pleura: The central tracheo-bronchial tree is patent. There is partial collapse of bilateral lower lobes. There is loculated small left and trace right pleural effusion. No mass, consolidation or pneumothorax. No suspicious lung nodules. Upper Abdomen: Small volume dependent gallstones/sludge noted without imaging signs of acute  cholecystitis. There is a small sliding hiatal hernia. Remaining visualized upper abdominal viscera within normal limits. Musculoskeletal: The visualized soft tissues of the chest wall are grossly unremarkable. No suspicious osseous lesions. There are mild multilevel degenerative changes in the visualized spine. IMPRESSION: 1. There is small volume occlusive embolism in the left lung lower lobe segmental pulmonary artery branch. No right heart strain or lung infarction. 2. There is loculated small left and trace right pleural effusion. 3. Multiple other nonacute observations, as described above. Aortic Atherosclerosis (ICD10-I70.0). Electronically Signed: By: Beula Brunswick M.D. On: 04/16/2024 17:55     Disposition Plan & Communication  Patient status: Inpatient  Admitted From: Home Planned disposition location: SNF Anticipated discharge date: pending lpacement   Family Communication: wife at bedside    Author: Melchizedek Espinola, DO [facility] 04/17/2024, 5:51 PM   Available by Epic secure chat 7AM-7PM. If 7PM-7AM, please contact night-coverage.  [facility] contact information found  on [email or web address].

## 2024-04-17 NOTE — NC FL2 (Signed)
 [place]  MEDICAID FL2 LEVEL OF CARE FORM     IDENTIFICATION  Patient Name: Todd Rojas Birthdate: 1941/01/09 Sex: male Admission Date (Current Location): 04/11/2024  [facility] and [place] Number:  Chiropodist and Address:  [facility], 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) 04/12/2024   Increased ileostomy output (HCC) 04/12/2024   Sepsis, unspecified organism (HCC) 04/12/2024   Gassiness 04/12/2024   Protein-calorie malnutrition, severe 03/27/2024   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   Gaseous abdominal distention 03/13/2024   Thyroid  nodule greater than or equal to 1.5 cm in diameter incidentally noted on imaging study 03/10/2024   Acute hypoxic respiratory failure (HCC) 03/10/2024   Severe sepsis with acute organ dysfunction (HCC) 03/09/2024   Elevated serum creatinine 03/09/2024   Elevated troponin 03/09/2024   Fever 03/09/2024   Sepsis (HCC) 08/16/2017   Acute renal failure with tubular necrosis (HCC)    Gross hematuria    COPD (chronic obstructive pulmonary disease) (HCC) 07/03/2017   Hyperlipidemia, unspecified 07/03/2017   Personal history of gout 12/09/2016   Status post left unicompartmental knee replacement 11/03/2016   Right-sided low back pain without sciatica 04/14/2016   Status post left partial knee replacement 12/23/2015   Right hip pain 07/30/2015   Status post right partial knee replacement  07/30/2015   Primary osteoarthritis of both knees 05/05/2015   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   GI disease 03/09/2013   Hearing loss 03/09/2013   CAD (coronary artery disease) 02/13/2012   Dyslipidemia 02/13/2012   Hypertension 02/13/2012    Orientation RESPIRATION BLADDER Height & Weight     Self, Time, Situation, Place  Normal Continent Weight: 173 lb (78.5 kg) Height:  5\' 9"  (175.3 cm)  BEHAVIORAL SYMPTOMS/MOOD NEUROLOGICAL BOWEL NUTRITION STATUS   (None)  (None) Continent, Ileostomy Diet (Regular)  AMBULATORY STATUS COMMUNICATION OF NEEDS Skin   Limited Assist Verbally Other (Comment) (Erythema/redness, rash.)                       Personal Care Assistance Level of Assistance  Bathing, Feeding, Dressing Bathing Assistance: Limited assistance Feeding assistance: Limited assistance Dressing Assistance: Limited assistance     Functional Limitations Info  Sight, Hearing, Speech Sight Info: Adequate Hearing Info: Adequate Speech Info: Adequate    SPECIAL CARE FACTORS FREQUENCY  PT (By licensed PT), OT (By licensed OT)     PT Frequency: 5 x week OT Frequency: 5 x week            Contractures Contractures Info: Not present    Additional Factors Info  Code Status, Allergies Code Status Info: DNR Allergies Info: NKDA           Current Medications (04/17/2024):  This is the current hospital active medication list Current Facility-Administered Medications  Medication Dose Route Frequency Provider Last Rate Last Admin   acetaminophen  (TYLENOL ) tablet 650 mg  650 mg Oral Q6H PRN Duncan, Hazel V, MD  650 mg at 04/17/24 1242   Or   acetaminophen  (TYLENOL ) suppository 650 mg  650 mg Rectal Q6H PRN Duncan, Hazel V, MD       albuterol  (PROVENTIL ) (2.5 MG/3ML) 0.083% nebulizer solution 2.5 mg  2.5 mg Inhalation Q6H PRN Duncan, Hazel V, MD       apixaban Herby Lolling) tablet 10 mg  10 mg Oral BID Ananias Balls, RPH   10 mg at 04/17/24 1610   Followed by   Cecily Cohen ON  04/23/2024] apixaban (ELIQUIS) tablet 5 mg  5 mg Oral BID Ananias Balls, [facility]       aspirin  EC tablet 81 mg  81 mg Oral Daily Duncan, Hazel V, MD   81 mg at 04/17/24 0903   cyclobenzaprine (FLEXERIL) tablet 5 mg  5 mg Oral Q8H PRN Anderson, Chelsey L, MD   5 mg at 04/17/24 1242   diclofenac  Sodium (VOLTAREN ) 1 % topical gel 2 g  2 g Topical QID PRN Elisabeth Guild, NP   2 g at 04/17/24 9604   HYDROcodone -acetaminophen  (NORCO/VICODIN) 5-325 MG per tablet 1-2 tablet  1-2 tablet Oral Q4H PRN Duncan, Hazel V, MD   2 tablet at 04/16/24 1043   melatonin tablet 5 mg  5 mg Oral QHS Duncan, Hazel V, MD   5 mg at 04/16/24 2059   midodrine  (PROAMATINE ) tablet 10 mg  10 mg Oral TID WC Ree Candy, MD   10 mg at 04/17/24 1242   mometasone -formoterol  (DULERA ) 200-5 MCG/ACT inhaler 2 puff  2 puff Inhalation BID Lanetta Pion, MD   2 puff at 04/17/24 5409   nitroGLYCERIN  (NITROSTAT ) SL tablet 0.4 mg  0.4 mg Sublingual Q5 min PRN Lanetta Pion, MD       ondansetron  (ZOFRAN ) tablet 4 mg  4 mg Oral Q6H PRN Duncan, Hazel V, MD       Or   ondansetron  (ZOFRAN ) injection 4 mg  4 mg Intravenous Q6H PRN Duncan, Hazel V, MD       rosuvastatin  (CRESTOR ) tablet 20 mg  20 mg Oral Daily Brion Cancel V, MD   20 mg at 04/17/24 8119   simethicone  (MYLICON) 40 MG/0.6ML suspension 40 mg  40 mg Oral QID Ree Candy, MD   40 mg at 04/17/24 1478   sulfamethoxazole -trimethoprim  (BACTRIM  DS) 800-160 MG per tablet 1 tablet  1 tablet Oral Q12H Ramonita Burow, RPH   1 tablet at 04/17/24 0908     Discharge Medications: Please see discharge summary for a list of discharge medications.  Relevant Imaging Results:  Relevant Lab Results:   Additional Information SS#: [id number]  Odilia Bennett, LCSW

## 2024-04-17 NOTE — Progress Notes (Addendum)
 Physical Therapy Treatment Patient Details Name: Todd Rojas MRN: [id number] DOB: 1941/07/11 Today's Date: 04/17/2024   History of Present Illness Pt is an 83 y/o M admitted on 04/11/24. Pt with recent 1 month long admission. Pt presents with c/o fever & vomiting. Continued workup demonstrate acute PE. PMH: CAD, COPD, HTN, ILD, bronchiectasis, COPD    PT Comments  Patient alert, agreeable to PT, reported intermittent severe L sided pain. Pt was TTP of L rib intercostals/latissimus dorsi, and stated it was concordant pain, team updated and pt request for flexeril passed on. PT also asked for a hot pack to assist with pain management as able. Supine to sit with HOB elevated, bed rails. Able to perform several transfers during session, ultimately minA to stabilize RW each time. Orthostatics also assessed, technically positive and pt did report lightheadedness throughout (lowest reading 110/76). spO2 ranged from 89-92% on room air. He was able to ambulate ~150ft with RW and CGA, but pt fatigued quickly and needed several standing rest breaks. Due to pt deconditioning from PLOF (fully independent prior to recent admission), return admission to hospital for discharge home, and current level of assistance needed, skilled PT intervention (<3hrs a day) to maximize pt function and safety, care team updated.     If plan is discharge home, recommend the following: Assist for transportation;Help with stairs or ramp for entrance;A little help with bathing/dressing/bathroom;A little help with walking and/or transfers;Assistance with cooking/housework   Can travel by private vehicle     Yes  Equipment Recommendations  Other (comment) (TBD)    Recommendations for Other Services       Precautions / Restrictions Precautions Precautions: Fall Recall of Precautions/Restrictions: Intact Precaution/Restrictions Comments: watch BP, spO2 Restrictions Weight Bearing Restrictions Per Provider Order: No      Mobility  Bed Mobility Overal bed mobility: Needs Assistance Bed Mobility: Supine to Sit     Supine to sit: Supervision, Used rails, HOB elevated          Transfers Overall transfer level: Needs assistance Equipment used: Rolling walker (2 wheels) Transfers: Sit to/from Stand Sit to Stand: Min assist           General transfer comment: pt required stabilization of RW for each transfer today    Ambulation/Gait Ambulation/Gait assistance: Contact guard assist Gait Distance (Feet): 120 Feet Assistive device: Rolling walker (2 wheels)   Gait velocity: decreased     General Gait Details: 2-3 standing rest breaks due to fatigue, to assess spO2. pt fatigued quickly   Stairs             Wheelchair Mobility     Tilt Bed    Modified Rankin (Stroke Patients Only)       Balance Overall balance assessment: Needs assistance Sitting-balance support: Feet supported, Bilateral upper extremity supported Sitting balance-Leahy Scale: Good     Standing balance support: During functional activity, Bilateral upper extremity supported, Reliant on assistive device for balance Standing balance-Leahy Scale: Fair                              Communication    Cognition Arousal: Alert Behavior During Therapy: WFL for tasks assessed/performed   PT - Cognitive impairments: No apparent impairments                         Following commands: Intact      Cueing    Exercises Other Exercises  Other Exercises: BP Assessed see vitals flowsheets but still technically orthostatic    General Comments        Pertinent Vitals/Pain Pain Assessment Pain Assessment: Faces Faces Pain Scale: Hurts whole lot Pain Location: L side of his abdomen Pain Descriptors / Indicators: Sharp, Spasm Pain Intervention(s): Limited activity within patient's tolerance, Monitored during session, Repositioned    Home Living                          Prior  Function            PT Goals (current goals can now be found in the care plan section) Progress towards PT goals: Progressing toward goals    Frequency    Min 2X/week      PT Plan      Co-evaluation              AM-PAC PT "6 Clicks" Mobility   Outcome Measure  Help needed turning from your back to your side while in a flat bed without using bedrails?: None Help needed moving from lying on your back to sitting on the side of a flat bed without using bedrails?: A Little Help needed moving to and from a bed to a chair (including a wheelchair)?: A Little Help needed standing up from a chair using your arms (e.g., wheelchair or bedside chair)?: A Little Help needed to walk in hospital room?: A Little Help needed climbing 3-5 steps with a railing? : A Little 6 Click Score: 19    End of Session Equipment Utilized During Treatment: Gait belt Activity Tolerance: Patient limited by fatigue Patient left: in chair;with call bell/phone within reach;with family/visitor present Nurse Communication: Mobility status PT Visit Diagnosis: Other abnormalities of gait and mobility (R26.89);Muscle weakness (generalized) (M62.81)     Time: 8119-1478 PT Time Calculation (min) (ACUTE ONLY): 30 min  Charges:    $Therapeutic Activity: 23-37 mins PT General Charges $$ ACUTE PT VISIT: 1 Visit                     Darien Eden PT, DPT 11:57 AM,04/17/24

## 2024-04-17 NOTE — Progress Notes (Signed)
 Occupational Therapy Treatment Patient Details Name: Treygan View MRN: [id number] DOB: 02-Nov-1941 Today's Date: 04/17/2024   History of present illness Pt is an 83 y/o M admitted on 04/11/24. Pt with recent 1 month long admission. Pt presents with c/o fever & vomiting. Continued workup demonstrate acute PE. PMH: CAD, COPD, HTN, ILD, bronchiectasis, COPD   OT comments  Chart reviewed to date, pt greeted in bed, wife present, agreeable to OT tx session targeting improving functional activity tolerance. Pt requires supervision for bed mobility, STS with MIN A, performing standing grooming tasks with supervision. Pt does endorse some dizziness but resolved with slow position changes. Pt feeds himself with set up. Educated pt/wife re: safe ADL completion, importance of fueling his body for continued therapy participation. Team notified of findings. OT will continue to follow.       If plan is discharge home, recommend the following:  A little help with walking and/or transfers;A little help with bathing/dressing/bathroom;Assistance with cooking/housework;Assist for transportation;Help with stairs or ramp for entrance   Equipment Recommendations  BSC/3in1;Tub/shower seat    Recommendations for Other Services      Precautions / Restrictions Precautions Precautions: Fall Recall of Precautions/Restrictions: Intact Precaution/Restrictions Comments: watch BP, spO2 Restrictions Weight Bearing Restrictions Per Provider Order: No       Mobility Bed Mobility Overal bed mobility: Needs Assistance Bed Mobility: Supine to Sit, Sit to Supine     Supine to sit: Supervision, HOB elevated, Used rails Sit to supine: Supervision, HOB elevated, Used rails        Transfers Overall transfer level: Needs assistance Equipment used: Rolling walker (2 wheels) Transfers: Sit to/from Stand Sit to Stand: Min assist           General transfer comment: multiple attempts     Balance Overall  balance assessment: Needs assistance Sitting-balance support: Feet supported, Bilateral upper extremity supported Sitting balance-Leahy Scale: Good     Standing balance support: During functional activity, Bilateral upper extremity supported, Reliant on assistive device for balance Standing balance-Leahy Scale: Fair                             ADL either performed or assessed with clinical judgement   ADL Overall ADL's : Needs assistance/impaired Eating/Feeding: Set up;Sitting   Grooming: Contact guard assist;Supervision/safety;Standing Grooming Details (indicate cue type and reason): with RW at sink level                 Toilet Transfer: Contact guard assist;Minimal assistance;Rolling walker (2 wheels) Toilet Transfer Details (indicate cue type and reason): simulated                Extremity/Trunk Assessment              Vision       Perception     Praxis     Communication Communication Communication: Impaired Factors Affecting Communication: Hearing impaired   Cognition Arousal: Alert Behavior During Therapy: WFL for tasks assessed/performed Cognition: No apparent impairments Difficult to assess due to: Hard of hearing/deaf                                      Cueing      Exercises Other Exercises Other Exercises: edu pt and wife re importance of continued food intake    Shoulder Instructions       General Comments  Pertinent Vitals/ Pain       Pain Assessment Pain Assessment: Faces Faces Pain Scale: Hurts a little bit Pain Location: generalized Pain Descriptors / Indicators: Discomfort Pain Intervention(s): Monitored during session, Limited activity within patient's tolerance, Repositioned  Home Living                                          Prior Functioning/Environment              Frequency  Min 2X/week        Progress Toward Goals  OT Goals(current goals can now be  found in the care plan section)  Progress towards OT goals: Progressing toward goals  Acute Rehab OT Goals Time For Goal Achievement: 04/28/24  Plan      Co-evaluation                 AM-PAC OT "6 Clicks" Daily Activity     Outcome Measure   Help from another person eating meals?: None Help from another person taking care of personal grooming?: None Help from another person toileting, which includes using toliet, bedpan, or urinal?: A Little Help from another person bathing (including washing, rinsing, drying)?: A Little Help from another person to put on and taking off regular upper body clothing?: A Little Help from another person to put on and taking off regular lower body clothing?: A Lot 6 Click Score: 19    End of Session Equipment Utilized During Treatment: Rolling walker (2 wheels)  OT Visit Diagnosis: Other abnormalities of gait and mobility (R26.89)   Activity Tolerance Patient tolerated treatment well   Patient Left in bed;with bed alarm set;with call bell/phone within reach;with family/visitor present   Nurse Communication Mobility status        Time: 1422-1450 OT Time Calculation (min): 28 min  Charges: OT General Charges $OT Visit: 1 Visit OT Treatments $Self Care/Home Management : 8-22 mins $Therapeutic Activity: 8-22 mins  Gerre Kraft, OTD OTR/L  04/17/24, 3:45 PM

## 2024-04-17 NOTE — TOC Progression Note (Signed)
 Transition of Care [facility]) - Progression Note    Patient Details  Name: Allanmichael Preiser MRN: [id number] Date of Birth: 04-21-1941  Transition of Care [facility]) CM/SW Contact  Odilia Bennett, LCSW Phone Number: 04/17/2024, 2:29 PM  Clinical Narrative:   CSW met with patient and wife. They are agreeable to SNF placement. Will follow up with offers once available.  Expected Discharge Plan: Home w Home Health Services Barriers to Discharge: Barriers Resolved  Expected Discharge Plan and Services       Living arrangements for the past 2 months: Single Family Home Expected Discharge Date: 04/12/24                         HH Arranged: RN, PT, OT [facility] Agency: [facility] Date [facility] Agency Contacted: 04/12/24   Representative spoke with at [facility] Agency: [place]    Social Determinants of Health (SDOH) Interventions SDOH Screenings   Food Insecurity: No Food Insecurity (04/12/2024)  Housing: Patient Declined (04/12/2024)  Transportation Needs: No Transportation Needs (04/12/2024)  Utilities: Not At Risk (04/12/2024)  Depression (PHQ2-9): Low Risk  (07/12/2021)  Financial Resource Strain: Low Risk  (10/23/2023)   Received from [facility]  Social Connections: Unknown (04/12/2024)  Tobacco Use: Medium Risk (04/11/2024)    Readmission Risk Interventions     No data to display

## 2024-04-18 DIAGNOSIS — Z932 Ileostomy status: Secondary | ICD-10-CM | POA: Diagnosis not present

## 2024-04-18 DIAGNOSIS — R198 Other specified symptoms and signs involving the digestive system and abdomen: Secondary | ICD-10-CM | POA: Diagnosis not present

## 2024-04-18 LAB — BASIC METABOLIC PANEL WITH GFR
Anion gap: 5 (ref 5–15)
BUN: 15 mg/dL (ref 8–23)
CO2: 26 mmol/L (ref 22–32)
Calcium: 8.3 mg/dL — ABNORMAL LOW (ref 8.9–10.3)
Chloride: 99 mmol/L (ref 98–111)
Creatinine, Ser: 1.21 mg/dL (ref 0.61–1.24)
GFR, Estimated: 59 mL/min — ABNORMAL LOW (ref >60)
Glucose, Bld: 98 mg/dL (ref 70–99)
Potassium: 3.9 mmol/L (ref 3.5–5.1)
Sodium: 130 mmol/L — ABNORMAL LOW (ref 135–145)

## 2024-04-18 MED ORDER — IBUPROFEN 400 MG PO TABS: 400.0000 mg | ORAL_TABLET | Freq: Four times a day (QID) | ORAL | Status: DC | PRN

## 2024-04-18 MED ORDER — ENSURE ENLIVE PO LIQD
237.0000 mL | Freq: Three times a day (TID) | ORAL | Status: DC
  Administered 2024-04-18 – 2024-04-19 (×3): 237 mL via ORAL

## 2024-04-18 MED ORDER — ADULT MULTIVITAMIN W/MINERALS CH
1.0000 | ORAL_TABLET | Freq: Every day | ORAL | Status: DC
  Administered 2024-04-19: 1 via ORAL
  Filled 2024-04-18: qty 1

## 2024-04-18 NOTE — Hospital Course (Addendum)
 Hospital course / significant events:   HPI: Todd Rojas is a 83 y.o. 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  He reports that his ileostomy output is very liquid whereas previously it was more solid.  Also has left mid back pain that started in the past 24 hours.   04/25: admitted to hospitalist service question of sepsis d/t GI source. Pain improved over course of the day and plan was for d/c home but episode of orthostatic hypotension. GI panel neg 04/26: hypotensive despite IVF and midodrine  . Urine cultures return positive GNR. Cardiology consult, no new workup 04/27: UCx Klebsiella 04/28: transition to po abx. Cardio s/o 04/29: PT/OT reeval now meeting criteria for SNF 04/30-05/01: SNF placement pending 05/02: SNF denied but pt is walking better today, ok for discharge      Consultants:  Cardiology   Procedures/Surgeries: none      ASSESSMENT & PLAN:    SIRS d/t diarrhea/pain Sepsis ruled out Klebsiella UTI vs question asymptomatic bateriuria No hydronephrosis or signs of pyelonephritis on CT. Klebsiella - bactrim  per sensitivities.    small PE in LLL may be contributing to the pain. Respiratory status stable ORA. Considered provoked given recent surgery/hospitalization.  Started eliquis  x90 days with loading dose.    L flank/rib pain- intermittent. Tender to touch. Worse at night. Suspect related to post-op CO2, digestive gas heating pad, voltaren  gel,  tylenol  simethicone  PRN   Orthostatic hypotension- related to hypovolemia. Troponins 43>32. S/p IV fluids Improving. Not symptomatic today with position changes. Will get repeat orthostatics prior to dc continue to hold home antihypertensives continue midodrine  cardiology consulted per family request and has signed off ted stockings   AKI- related to hypotension, UTI- resolved follow BMP.   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 Outpatient follow up with surgery was scheduled for 5/5. Consider inpatient consult if not stable output Negative GI panel and C. Difficile Intake and output monitoring Has significant amount of gas requiring frequent "burping" of ileostomy bag - Simethicone  PRN   CAD (coronary artery disease) Continue aspirin , rosuvastatin   Lisinopril , amlodipine  held No complaints of chest pain and EKG nonacute Cardiology has signed off   COPD (chronic obstructive pulmonary disease) Not acutely exacerbated DuoNebs as needed Continue home inhaler   Hyperlipidemia, unspecified Continue rosuvastatin    Thyroid  nodule greater than or equal to 1.5 cm in diameter incidentally noted on imaging study For outpatient workup   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 Outpatient workup, PSA correlation recommended by radiologist   overweight based on BMI: Body mass index is 25.55 kg/m.[name] Significantly low or high BMI is associated with higher medical risk.  Underweight - under 18  overweight - 25 to 29 obese - 30 or more Class 1 obesity: BMI of 30.0 to 34 Class 2 obesity: BMI of 35.0 to 39 Class 3 obesity: BMI of 40.0 to 49 Super Morbid Obesity: BMI 50-59 Super-super Morbid Obesity: BMI 60+ Healthy nutrition and physical activity advised as adjunct to other disease management and risk reduction treatments    DVT prophylaxis: eliquis  IV fluids:  no continuous IV fluids  Nutrition: regular Central lines / other devices: colostomy  Code  Status: DNR ACP documentation reviewed:  none on file in [facility]  [facility] needs: SNF placement  Medical barriers to dispo: placement. Currently medically ready for discharge.

## 2024-04-18 NOTE — Plan of Care (Signed)
   Problem: Fluid Volume: Goal: Hemodynamic stability will improve Outcome: Progressing   Problem: Clinical Measurements: Goal: Diagnostic test results will improve Outcome: Progressing Goal: Signs and symptoms of infection will decrease Outcome: Progressing   Problem: Respiratory: Goal: Ability to maintain adequate ventilation will improve Outcome: Progressing

## 2024-04-18 NOTE — Progress Notes (Signed)
 Physical Therapy Treatment Patient Details Name: Todd Rojas MRN: [id number] DOB: 07/23/1941 Today's Date: 04/18/2024   History of Present Illness Pt is an 83 y/o M admitted on 04/11/24. Pt with recent 1 month long admission. Pt presents with c/o fever & vomiting. Continued workup demonstrate acute PE. PMH: CAD, COPD, HTN, ILD, bronchiectasis, COPD    PT Comments  Patient received in bed, wife at bedside. Patient has flat affect. He is agreeable to PT session. Patient reports he had been sitting edge of bed. He is mod I for bed mobility, transfers with supervision. Patient is able to ambulate 150 feet with RW and cga. Some mild fatigue reported, but did not require rest breaks. O2 saturations in 90%s throughout on room air. Patient will continue to benefit from skilled PT to improve strength and endurance.         If plan is discharge home, recommend the following: Assist for transportation   Can travel by private vehicle     Yes  Equipment Recommendations  Rolling walker (2 wheels)    Recommendations for Other Services       Precautions / Restrictions Precautions Precautions: Fall Recall of Precautions/Restrictions: Intact Restrictions Weight Bearing Restrictions Per Provider Order: No     Mobility  Bed Mobility Overal bed mobility: Modified Independent Bed Mobility: Supine to Sit, Sit to Supine     Supine to sit: Modified independent (Device/Increase time), HOB elevated Sit to supine: Modified independent (Device/Increase time)        Transfers Overall transfer level: Needs assistance Equipment used: Rolling walker (2 wheels) Transfers: Sit to/from Stand Sit to Stand: Supervision           General transfer comment: able to boost up on first attempt this session    Ambulation/Gait Ambulation/Gait assistance: Contact guard assist Gait Distance (Feet): 150 Feet Assistive device: Rolling walker (2 wheels) Gait Pattern/deviations: Step-through pattern,  Decreased stride length Gait velocity: slightly decreased     General Gait Details: Patient improving, no rest breaks needed this session during walk. Reports some mild sob, O2 sats in 90%s throughout.   Stairs             Wheelchair Mobility     Tilt Bed    Modified Rankin (Stroke Patients Only)       Balance Overall balance assessment: Needs assistance   Sitting balance-Leahy Scale: Good     Standing balance support: During functional activity, Reliant on assistive device for balance Standing balance-Leahy Scale: Good                              Communication Communication Communication: No apparent difficulties  Cognition Arousal: Alert Behavior During Therapy: WFL for tasks assessed/performed   PT - Cognitive impairments: No apparent impairments                       PT - Cognition Comments: pt does not volunteer any information but answers questions appropriately Following commands: Intact Following commands impaired: Follows multi-step commands with increased time    Cueing Cueing Techniques: Verbal cues  Exercises      General Comments        Pertinent Vitals/Pain Pain Assessment Pain Assessment: No/denies pain    Home Living                          Prior Function  PT Goals (current goals can now be found in the care plan section) Acute Rehab PT Goals Patient Stated Goal: get better, go home PT Goal Formulation: With patient/family Time For Goal Achievement: 04/28/24 Potential to Achieve Goals: Good Progress towards PT goals: Progressing toward goals    Frequency    Min 2X/week      PT Plan      Co-evaluation              AM-PAC PT "6 Clicks" Mobility   Outcome Measure  Help needed turning from your back to your side while in a flat bed without using bedrails?: None Help needed moving from lying on your back to sitting on the side of a flat bed without using bedrails?:  None Help needed moving to and from a bed to a chair (including a wheelchair)?: A Little Help needed standing up from a chair using your arms (e.g., wheelchair or bedside chair)?: A Little Help needed to walk in hospital room?: A Little Help needed climbing 3-5 steps with a railing? : A Lot 6 Click Score: 19    End of Session Equipment Utilized During Treatment: Gait belt Activity Tolerance: Patient tolerated treatment well Patient left: in bed;with call bell/phone within reach;with family/visitor present Nurse Communication: Mobility status       Time: 1415-1430 PT Time Calculation (min) (ACUTE ONLY): 15 min  Charges:    $Gait Training: 8-22 mins PT General Charges $$ ACUTE PT VISIT: 1 Visit                     Latosha Gaylord, PT, GCS 04/18/24,2:42 PM

## 2024-04-18 NOTE — TOC Progression Note (Addendum)
 Transition of Care [facility]) - Progression Note    Patient Details  Name: Yuven Sahota MRN: [id number] Date of Birth: [birth year]  Transition of Care [facility]) CM/SW Contact  Odilia Bennett, LCSW Phone Number: 04/18/2024, 10:44 AM  Clinical Narrative:  Wife not at bedside. CSW left her a voicemail. Will provide update when she calls back.   1:23 pm: Patient and wife have accepted bed offer from [facility]. They will have a bed tomorrow. CSW started insurance authorization. Family will transport at discharge.  Expected Discharge Plan: Home w Home Health Services Barriers to Discharge: Barriers Resolved  Expected Discharge Plan and Services       Living arrangements for the past 2 months: Single Family Home Expected Discharge Date: 04/12/24                         HH Arranged: RN, PT, OT [facility] Agency: [facility] Date [facility] Agency Contacted: 04/12/24   Representative spoke with at [facility] Agency: [place]    Social Determinants of Health (SDOH) Interventions SDOH Screenings   Food Insecurity: No Food Insecurity (04/12/2024)  Housing: Patient Declined (04/12/2024)  Transportation Needs: No Transportation Needs (04/12/2024)  Utilities: Not At Risk (04/12/2024)  Depression (PHQ2-9): Low Risk  (07/12/2021)  Financial Resource Strain: Low Risk  (10/23/2023)   Received from [facility]  Social Connections: Unknown (04/12/2024)  Tobacco Use: Medium Risk (04/11/2024)    Readmission Risk Interventions     No data to display

## 2024-04-18 NOTE — Progress Notes (Signed)
 Initial Nutrition Assessment  DOCUMENTATION CODES:   Severe malnutrition in context of acute illness/injury  INTERVENTION:   Ensure Enlive po TID, each supplement provides 350 kcal and 20 grams of protein.  Ice cream TID with Ensure  MVI po daily   Pt at high refeed risk; recommend monitor potassium, magnesium  and phosphorus labs daily until stable  Daily weights  NUTRITION DIAGNOSIS:   Severe Malnutrition related to acute illness as evidenced by percent weight loss, moderate fat depletion, moderate muscle depletion.  GOAL:   Patient will meet greater than or equal to 90% of their needs  MONITOR:   Skin, PO intake, Supplement acceptance, Labs, Weight trends, I & O's  REASON FOR ASSESSMENT:   Malnutrition Screening Tool    ASSESSMENT:   83 y/o male with h/o CAD, COPD, HLD, gout, DDD, GERD, CHF, thyroid  nodule, kidney stones, B12 deficiency, CKD III and recent admission for PNA, sepsis and Ogilvie syndrome s/p colonic decompression 3/30 & s/p robotic assisted laparoscopic loop ileostomy with decompression of cecum 4/2 that was complicated by post op ileus requiring lengthy admission and TPN and who is now admitted with UTI, diarrhea and PE.  Met with pt and wife in room today. Pt is well known to this RD from his recent previous admit. Pt reports poor appetite and oral intake since leaving the hospital. Pt reports that he will feel hungry for something but then after a few bites, he doesn't want it any more. Pt continues to experience taste changes. Wife reports that patient has only been eating bites of meals. Pt reluctant to drink supplements last admission but reports that he did drink part of a chocolate Ensure mixed with ice cream earlier today. Pt has not been taking any multivitamin at home; pt reports that he tried Flintstones and gummy vitamins but reports they tasted terrible. RD discussed with pt the importance of adequate nutrition needed to preserve lean muscle. Pt  reports he is willing to try Ensure mixed with ice cream. RD will add supplements and MVI to help pt meet his estimated needs. Pt is at high refeed risk. Per chart, pt is down ~28lbs(14%) over the past 6 weeks; this is severe weight loss. Pt appears to be down 1-2lbs since discharge.   Pt reports that he feels urgency to have a BM from his rectum but reports that he does not produce any stool with straining. Pt does report a h/o hemorrhoids that he reports were so severe that he almost needed surgery. Pt reporting that he experiences pain in his rectum similar to when he had hemorrhoids before.    Medications reviewed and include: melatonin, midodrine , simethicone   Labs reviewed: Na 130(L), K 3.9 wnl  NUTRITION - FOCUSED PHYSICAL EXAM:  Flowsheet Row Most Recent Value  Orbital Region Mild depletion  Upper Arm Region Moderate depletion  Thoracic and Lumbar Region Mild depletion  Buccal Region Mild depletion  Temple Region Mild depletion  Clavicle Bone Region Moderate depletion  Clavicle and Acromion Bone Region Moderate depletion  Scapular Bone Region No depletion  Dorsal Hand Moderate depletion  Patellar Region Severe depletion  Anterior Thigh Region Moderate depletion  Posterior Calf Region Moderate depletion  Edema (RD Assessment) None  Hair Reviewed  Eyes Reviewed  Mouth Reviewed  Skin Reviewed  Nails Reviewed   Diet Order:   Diet Order             Diet regular Fluid consistency: Thin  Diet effective now  EDUCATION NEEDS:   Education needs have been addressed  Skin:  Skin Assessment: Reviewed RN Assessment (closed incision abdomen)  Last BM:  5/1- via ostomy  Height:   Ht Readings from Last 1 Encounters:  04/11/24 5\' 9"  (1.753 m)    Weight:   Wt Readings from Last 1 Encounters:  04/18/24 77.8 kg    Ideal Body Weight:  72.7 kg  BMI:  Body mass index is 25.33 kg/m.  Estimated Nutritional Needs:   Kcal:   1900-2200kcal/day  Protein:  95-110g/day  Fluid:  1.9-2.2L/day  Torrance Freestone MS, RD, LDN If unable to be reached, please send secure chat to "RD inpatient" available from 8:00a-4:00p daily

## 2024-04-18 NOTE — Progress Notes (Signed)
 PROGRESS NOTE    Todd Rojas   ZOX:[id number] DOB: Apr 21, 1941  DOA: 04/11/2024 Date of Service: 04/18/24 which is hospital day 5  PCP: Todd Baumgarten, MD    Hospital course / significant events:   HPI: Todd Rojas is a 83 y.o. 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  He reports that his ileostomy output is very liquid whereas previously it was more solid.  Also has left mid back pain that started in the past 24 hours.   04/25: admitted to hospitalist service question of sepsis d/t GI source. Pain improved over course of the day and plan was for d/c home but episode of orthostatic hypotension. GI panel neg 04/26: hypotensive despite IVF and midodrine  . Urine cultures return positive GNR. Cardiology consult, no new workup 04/27: UCx Klebsiella 04/28: transition to po abx. Cardio s/o 04/29: PT/OT reeval now meeting criteria for SNF 04/30-05/01: SNF placement pending     Consultants:  Cardiology   Procedures/Surgeries: none      ASSESSMENT & PLAN:    SIRS d/t diarrhea/pain Sepsis ruled out Klebsiella UTI vs question asymptomatic bateriuria No hydronephrosis or signs of pyelonephritis on CT. Klebsiella - bactrim  per sensitivities.    small PE in LLL may be contributing to the pain. Respiratory status stable ORA. Considered provoked given recent surgery/hospitalization.  Started eliquis  x90 days with loading dose.    L flank/rib pain- intermittent. Tender to  touch. Worse at night. Suspect related to post-op CO2, digestive gas heating pad, voltaren  gel, tylenol  simethicone  PRN   Orthostatic hypotension- related to hypovolemia. Troponins 43>32. S/p IV fluids Improving. Not symptomatic today with position changes. Will get repeat orthostatics prior to dc continue to hold home antihypertensives continue midodrine  cardiology consulted per family request and has signed off ted stockings   AKI- related to hypotension, UTI- resolved follow BMP.   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 Outpatient follow up with surgery was scheduled for 5/5. Consider inpatient consult if not stable output Negative GI panel and C. Difficile Intake and output monitoring Has significant amount of gas requiring frequent "burping" of ileostomy bag - Simethicone  PRN   CAD (coronary artery disease) Continue aspirin , rosuvastatin   Lisinopril , amlodipine  held No complaints of chest pain and EKG nonacute Cardiology has signed off   COPD (chronic obstructive pulmonary disease) Not acutely exacerbated DuoNebs as needed Continue home inhaler   Hyperlipidemia, unspecified Continue rosuvastatin    Thyroid  nodule greater than or equal to 1.5 cm in diameter incidentally noted on imaging study For outpatient workup   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 Outpatient workup, PSA correlation recommended by radiologist   overweight based on BMI: Body mass index is 25.55 kg/m.Todd Rojas Significantly low or high BMI is associated with higher medical risk.  Underweight - under 18  overweight - 25 to 29 obese - 30 or more Class 1 obesity: BMI of 30.0 to 34 Class 2 obesity: BMI of 35.0 to 39 Class 3 obesity: BMI of 40.0 to 49 Super Morbid Obesity: BMI 50-59 Super-super Morbid Obesity: BMI 60+ Healthy nutrition and physical  activity advised as adjunct to other disease management and risk reduction treatments    DVT prophylaxis: eliquis  IV fluids: no  continuous IV fluids  Nutrition: regular Central lines / other devices: colostomy  Code Status: DNR ACP documentation reviewed:  none on file in [facility]  [facility] needs: SNF placement  Medical barriers to dispo: placement. Currently medically ready for discharge.              Subjective / Brief ROS:  Patient reports feeling tired today no other concerns Denies CP/SOB.  Pain controlled.  Denies new weakness.  Tolerating diet.  Reports no concerns w/ urination or ostomy.   Family Communication: none at bedside     Objective Findings:  Vitals:   04/17/24 1627 04/17/24 2036 04/18/24 0322 04/18/24 0846  BP: 124/69 124/70 124/62 109/68  Pulse: 69 64 79 84  Resp:  16 17 17   Temp: 98.3 F (36.8 C) 98.3 F (36.8 C) 98.1 F (36.7 C)   TempSrc:   Oral   SpO2: 94% 95% 92% 93%  Weight:      Height:        Intake/Output Summary (Last 24 hours) at 04/18/2024 1337 Last data filed at 04/18/2024 1100 Gross per 24 hour  Intake 120 ml  Output 925 ml  Net -805 ml   Filed Weights   04/11/24 2338  Weight: 78.5 kg    Examination:  Physical Exam Constitutional:      General: He is not in acute distress. Cardiovascular:     Rate and Rhythm: Normal rate and regular rhythm.  Pulmonary:     Effort: Pulmonary effort is normal.     Breath sounds: Normal breath sounds.  Abdominal:     General: Abdomen is flat. Bowel sounds are normal.     Palpations: Abdomen is soft.  Musculoskeletal:     Right lower leg: No edema.     Left lower leg: No edema.  Skin:    General: Skin is warm and dry.  Neurological:     Mental Status: He is alert. Mental status is at baseline.  Psychiatric:        Mood and Affect: Mood normal.        Behavior: Behavior normal.          Scheduled Medications:   apixaban   10 mg Oral BID   Followed by   Todd Rojas ON 04/23/2024] apixaban   5 mg Oral BID   aspirin  EC  81 mg Oral Daily   melatonin  5 mg Oral QHS   midodrine   10 mg Oral TID WC    mometasone -formoterol   2 puff Inhalation BID   rosuvastatin   20 mg Oral Daily   simethicone   40 mg Oral QID    Continuous Infusions:   PRN Medications:  acetaminophen  **OR** acetaminophen , albuterol , cyclobenzaprine , diclofenac  Sodium, HYDROcodone -acetaminophen , nitroGLYCERIN , ondansetron  **OR** ondansetron  (ZOFRAN ) IV  Antimicrobials from admission:  Anti-infectives (From admission, onward)    Start     Dose/Rate Route Frequency Ordered Stop   04/15/24 1000  sulfamethoxazole -trimethoprim  (BACTRIM  DS) 800-160 MG per tablet 1 tablet        1 tablet Oral Every 12 hours 04/15/24 0731 04/17/24 2049   04/13/24 0430  vancomycin  (VANCOREADY) IVPB 1250 mg/250 mL  Status:  Discontinued        1,250 mg 166.7 mL/hr over 90 Minutes Intravenous Every 24 hours 04/12/24 0421 04/12/24 0740   04/13/24 0345  meropenem  (MERREM ) 1 g in sodium chloride  0.9 % 100 mL IVPB  Status:  Discontinued        1 g 200 mL/hr over 30 Minutes Intravenous Every 12 hours 04/13/24 0255 04/15/24 0734   04/12/24 1600  metroNIDAZOLE  (FLAGYL ) IVPB 500 mg  Status:  Discontinued        500 mg 100 mL/hr over 60 Minutes Intravenous Every 12 hours 04/12/24 0407 04/12/24 0740   04/12/24 1600  ceFEPIme  (MAXIPIME ) 2 g in sodium chloride  0.9 % 100 mL IVPB  Status:  Discontinued        2 g 200 mL/hr over 30 Minutes Intravenous Every 12 hours 04/12/24 0418 04/12/24 0740   04/12/24 0400  metroNIDAZOLE  (FLAGYL ) IVPB 500 mg        500 mg 100 mL/hr over 60 Minutes Intravenous  Once 04/12/24 0347 04/12/24 0410   04/12/24 0400  vancomycin  (VANCOREADY) IVPB 1750 mg/350 mL        1,750 mg 175 mL/hr over 120 Minutes Intravenous  Once 04/12/24 0354 04/12/24 0622   04/12/24 0400  ceFEPIme  (MAXIPIME ) 2 g in sodium chloride  0.9 % 100 mL IVPB        2 g 200 mL/hr over 30 Minutes Intravenous  Once 04/12/24 0355 04/12/24 0433           Data Reviewed:  I have personally reviewed the following...  CBC: Recent Labs  Lab 04/11/24 2349  04/12/24 1720 04/13/24 0546 04/14/24 0525 04/15/24 0555 04/17/24 0614  WBC 19.8* 23.0* 18.0* 12.4* 7.7 6.9  NEUTROABS 17.4*  --   --   --   --   --   HGB 12.6* 11.1* 9.4* 9.8* 10.2* 11.8*  HCT 37.6* 33.9* 28.5* 30.1* 30.2* 34.7*  MCV 97.4 99.1 98.3 99.7 96.5 93.5  PLT 299 278 221 212 246 287   Basic Metabolic Panel: Recent Labs  Lab 04/13/24 0546 04/14/24 0525 04/15/24 0555 04/17/24 0614 04/18/24 0550  NA 133* 131* 135 131* 130*  K 4.5 4.5 4.1 4.0 3.9  CL 107 107 106 98 99  CO2 19* 20* 22 23 26   GLUCOSE 99 103* 96 88 98  BUN 37* 33* 22 14 15   CREATININE 2.68* 1.48* 1.09 1.06 1.21  CALCIUM  7.9* 8.1* 8.5* 8.8* 8.3*  MG 1.6*  --   --   --   --    GFR: Estimated Creatinine Clearance: 46.3 mL/min (by C-G formula based on SCr of 1.21 mg/dL). Liver Function Tests: Recent Labs  Lab 04/12/24 0028  AST 21  ALT 27  ALKPHOS 98  BILITOT 0.7  PROT 7.4  ALBUMIN  3.1*   No results for input(s): "LIPASE", "AMYLASE" in the last 168 hours. No results for input(s): "AMMONIA" in the last 168 hours. Coagulation Profile: Recent Labs  Lab 04/11/24 2349 04/16/24 2140  INR 1.1 1.2   Cardiac Enzymes: No results for input(s): "CKTOTAL", "CKMB", "CKMBINDEX", "TROPONINI" in the last 168 hours. BNP (last 3 results) No results for input(s): "PROBNP" in the last 8760 hours. HbA1C: No results for input(s): "HGBA1C" in the last 72 hours. CBG: Recent Labs  Lab 04/12/24 1650 04/12/24 2105 04/13/24 0930 04/13/24 1200 04/13/24 1658  GLUCAP 133* 122* 133* 109* 101*   Lipid Profile: No results for input(s): "CHOL", "HDL", "LDLCALC", "TRIG", "CHOLHDL", "LDLDIRECT" in the last 72 hours. Thyroid  Function Tests: No results for input(s): "TSH", "T4TOTAL", "FREET4", "T3FREE", "THYROIDAB" in the last 72 hours. Anemia Panel: No results for input(s): "VITAMINB12", "FOLATE", "FERRITIN", "TIBC", "IRON", "RETICCTPCT" in the last 72 hours. Most Recent Urinalysis On File:     Component Value  Date/Time  COLORURINE YELLOW (A) 04/12/2024 0215   APPEARANCEUR HAZY (A) 04/12/2024 0215   APPEARANCEUR Cloudy (A) 08/23/2017 1018   LABSPEC 1.040 (H) 04/12/2024 0215   PHURINE 5.0 04/12/2024 0215   GLUCOSEU NEGATIVE 04/12/2024 0215   HGBUR NEGATIVE 04/12/2024 0215   BILIRUBINUR NEGATIVE 04/12/2024 0215   BILIRUBINUR Negative 08/23/2017 1018   KETONESUR NEGATIVE 04/12/2024 0215   PROTEINUR NEGATIVE 04/12/2024 0215   NITRITE NEGATIVE 04/12/2024 0215   LEUKOCYTESUR NEGATIVE 04/12/2024 0215   Sepsis Labs: @LABRCNTIP (procalcitonin:4,lacticidven:4) Microbiology: Recent Results (from the past 240 hours)  Blood Culture (routine x 2)     Status: None   Collection Time: 04/11/24 11:49 PM   Specimen: BLOOD  Result Value Ref Range Status   Specimen Description BLOOD RIGHT HAND  Final   Special Requests   Final    BOTTLES DRAWN AEROBIC AND ANAEROBIC Blood Culture results may not be optimal due to an inadequate volume of blood received in culture bottles   Culture   Final    NO GROWTH 5 DAYS Performed at [facility], [address] [place], [place] [address]    Report Status 04/16/2024 FINAL  Final  Blood Culture (routine x 2)     Status: None   Collection Time: 04/11/24 11:49 PM   Specimen: BLOOD  Result Value Ref Range Status   Specimen Description BLOOD LEFT FA  Final   Special Requests   Final    BOTTLES DRAWN AEROBIC AND ANAEROBIC Blood Culture results may not be optimal due to an inadequate volume of blood received in culture bottles   Culture   Final    NO GROWTH 5 DAYS Performed at [facility], [address] [place], [place] [address]    Report Status 04/16/2024 FINAL  Final  Gastrointestinal Panel by PCR , Stool     Status: None   Collection Time: 04/11/24 11:49 PM   Specimen: Stool  Result Value Ref Range Status   Campylobacter species NOT DETECTED NOT DETECTED Final   Plesimonas shigelloides NOT DETECTED NOT DETECTED Final   Salmonella species  NOT DETECTED NOT DETECTED Final   Yersinia enterocolitica NOT DETECTED NOT DETECTED Final   Vibrio species NOT DETECTED NOT DETECTED Final   Vibrio cholerae NOT DETECTED NOT DETECTED Final   Enteroaggregative E coli (EAEC) NOT DETECTED NOT DETECTED Final   Enteropathogenic E coli (EPEC) NOT DETECTED NOT DETECTED Final   Enterotoxigenic E coli (ETEC) NOT DETECTED NOT DETECTED Final   Shiga like toxin producing E coli (STEC) NOT DETECTED NOT DETECTED Final   Shigella/Enteroinvasive E coli (EIEC) NOT DETECTED NOT DETECTED Final   Cryptosporidium NOT DETECTED NOT DETECTED Final   Cyclospora cayetanensis NOT DETECTED NOT DETECTED Final   Entamoeba histolytica NOT DETECTED NOT DETECTED Final   Giardia lamblia NOT DETECTED NOT DETECTED Final   Adenovirus F40/41 NOT DETECTED NOT DETECTED Final   Astrovirus NOT DETECTED NOT DETECTED Final   Norovirus GI/GII NOT DETECTED NOT DETECTED Final   Rotavirus A NOT DETECTED NOT DETECTED Final   Sapovirus (I, II, IV, and V) NOT DETECTED NOT DETECTED Final    Comment: Performed at [facility], [address] [place], [place] [address]  C Difficile Quick Screen w PCR reflex     Status: None   Collection Time: 04/11/24 11:49 PM   Specimen: Stool  Result Value Ref Range Status   C Diff antigen NEGATIVE NEGATIVE Final   C Diff toxin NEGATIVE NEGATIVE Final   C Diff interpretation No C. difficile  detected.  Final    Comment: Performed at [facility], [address] [place], [place] [address]  Resp panel by RT-PCR (RSV, Flu A&B, Covid) Anterior Nasal Swab     Status: None   Collection Time: 04/11/24 11:49 PM   Specimen: Anterior Nasal Swab  Result Value Ref Range Status   SARS Coronavirus 2 by RT PCR NEGATIVE NEGATIVE Final    Comment: (NOTE) SARS-CoV-2 target nucleic acids are NOT DETECTED.  The SARS-CoV-2 RNA is generally detectable in upper respiratory specimens during the acute phase of infection. The lowest concentration of  SARS-CoV-2 viral copies this assay can detect is 138 copies/mL. A negative result does not preclude SARS-Cov-2 infection and should not be used as the sole basis for treatment or other patient management decisions. A negative result may occur with  improper specimen collection/handling, submission of specimen other than nasopharyngeal swab, presence of viral mutation(s) within the areas targeted by this assay, and inadequate number of viral copies(<138 copies/mL). A negative result must be combined with clinical observations, patient history, and epidemiological information. The expected result is Negative.  Fact Sheet for Patients:  BloggerCourse.com  Fact Sheet for Healthcare Providers:  SeriousBroker.it  This test is no t yet approved or cleared by the [place]  FDA and  has been authorized for detection and/or diagnosis of SARS-CoV-2 by FDA under an Emergency Use Authorization (EUA). This EUA will remain  in effect (meaning this test can be used) for the duration of the COVID-19 declaration under Section 564(b)(1) of the Act, 21 U.S.C.section 360bbb-3(b)(1), unless the authorization is terminated  or revoked sooner.       Influenza A by PCR NEGATIVE NEGATIVE Final   Influenza B by PCR NEGATIVE NEGATIVE Final    Comment: (NOTE) The Xpert Xpress SARS-CoV-2/FLU/RSV plus assay is intended as an aid in the diagnosis of influenza from Nasopharyngeal swab specimens and should not be used as a sole basis for treatment. Nasal washings and aspirates are unacceptable for Xpert Xpress SARS-CoV-2/FLU/RSV testing.  Fact Sheet for Patients: BloggerCourse.com  Fact Sheet for Healthcare Providers: SeriousBroker.it  This test is not yet approved or cleared by the [place]  FDA and has been authorized for detection and/or diagnosis of SARS-CoV-2 by FDA under an Emergency Use  Authorization (EUA). This EUA will remain in effect (meaning this test can be used) for the duration of the COVID-19 declaration under Section 564(b)(1) of the Act, 21 U.S.C. section 360bbb-3(b)(1), unless the authorization is terminated or revoked.     Resp Syncytial Virus by PCR NEGATIVE NEGATIVE Final    Comment: (NOTE) Fact Sheet for Patients: BloggerCourse.com  Fact Sheet for Healthcare Providers: SeriousBroker.it  This test is not yet approved or cleared by the [place]  FDA and has been authorized for detection and/or diagnosis of SARS-CoV-2 by FDA under an Emergency Use Authorization (EUA). This EUA will remain in effect (meaning this test can be used) for the duration of the COVID-19 declaration under Section 564(b)(1) of the Act, 21 U.S.C. section 360bbb-3(b)(1), unless the authorization is terminated or revoked.  Performed at [facility], [address] [place], [place] [address]   Urine Culture     Status: Abnormal   Collection Time: 04/12/24  2:15 AM   Specimen: Urine, Clean Catch  Result Value Ref Range Status   Specimen Description   Final    URINE, CLEAN CATCH Performed at [facility], [address] [place], [place] [address]    Special Requests   Final  NONE Performed at [facility], [address] [place], [place] [address]    Culture 20,000 COLONIES/mL KLEBSIELLA PNEUMONIAE (A)  Final   Report Status 04/14/2024 FINAL  Final   Organism ID, Bacteria KLEBSIELLA PNEUMONIAE (A)  Final      Susceptibility   Klebsiella pneumoniae - MIC*    AMPICILLIN >=32 RESISTANT Resistant     CEFAZOLIN  <=4 SENSITIVE Sensitive     CEFEPIME  <=0.12 SENSITIVE Sensitive     CEFTRIAXONE  <=0.25 SENSITIVE Sensitive     CIPROFLOXACIN  <=0.25 SENSITIVE Sensitive     GENTAMICIN <=1 SENSITIVE Sensitive     IMIPENEM <=0.25 SENSITIVE Sensitive     NITROFURANTOIN 128 RESISTANT Resistant      TRIMETH /SULFA  <=20 SENSITIVE Sensitive     AMPICILLIN/SULBACTAM 16 INTERMEDIATE Intermediate     PIP/TAZO 16 SENSITIVE Sensitive ug/mL    * 20,000 COLONIES/mL KLEBSIELLA PNEUMONIAE  MRSA Next Gen by PCR, Nasal     Status: None   Collection Time: 04/13/24  3:25 AM   Specimen: Nasal Mucosa; Nasal Swab  Result Value Ref Range Status   MRSA by PCR Next Gen NOT DETECTED NOT DETECTED Final    Comment: (NOTE) The GeneXpert MRSA Assay (FDA approved for NASAL specimens only), is one component of a comprehensive MRSA colonization surveillance program. It is not intended to diagnose MRSA infection nor to guide or monitor treatment for MRSA infections. Test performance is not FDA approved in patients less than 71 years old. Performed at [facility], [address], [place], [place] [address]       Radiology Studies last 3 days: CT CHEST W CONTRAST Addendum Date: 04/16/2024 ADDENDUM REPORT: 04/16/2024 18:27 ADDENDUM: Critical Value/emergent results were called by telephone at the time of interpretation on 04/16/2024 at 6:25 pm to provider [employer] , who verbally acknowledged these results. Electronically Signed   By: Beula Brunswick M.D.   On: 04/16/2024 18:27   Result Date: 04/16/2024 CLINICAL DATA:  Chest pain, nonspecific. EXAM: CT CHEST WITH CONTRAST TECHNIQUE: Multidetector CT imaging of the chest was performed during intravenous contrast administration. RADIATION DOSE REDUCTION: This exam was performed according to the departmental dose-optimization program which includes automated exposure control, adjustment of the mA and/or kV according to patient size and/or use of iterative reconstruction technique. CONTRAST:  75mL OMNIPAQUE  IOHEXOL  300 MG/ML  SOLN COMPARISON:  CT scan chest from 03/10/2024. FINDINGS: Cardiovascular: Normal cardiac size. No pericardial effusion. No aortic aneurysm. There are coronary artery calcifications, in keeping with coronary artery disease. There are  also mild peripheral atherosclerotic vascular calcifications of thoracic aorta and its major branches. Please note, this examination was not ordered/performed as per the pulmonary embolism protocol; however, there is satisfactory opacification of pulmonary arteries. There is small volume occlusive embolism noted in the left lung lower lobe segmental pulmonary artery branch (series 2, images 90-92). There is no right heart strain or lung infarction. There is dilation of the main pulmonary trunk measuring up to 3.0 cm, which is nonspecific but can be seen with pulmonary artery hypertension. Mediastinum/Nodes: There is a 1.0 x 2.3 cm hypoattenuating nodule in the left thyroid  lobe, incompletely characterized on the current exam but grossly unchanged since the prior study. Please refer to ultrasound from 03/10/2024 for details. No solid / cystic mediastinal masses. The esophagus is nondistended precluding optimal assessment. There are few mildly prominent mediastinal and hilar lymph nodes, which do not meet the size criteria for lymphadenopathy and appear grossly similar to the prior study, favoring benign etiology. No  axillary lymphadenopathy by size criteria. Lungs/Pleura: The central tracheo-bronchial tree is patent. There is partial collapse of bilateral lower lobes. There is loculated small left and trace right pleural effusion. No mass, consolidation or pneumothorax. No suspicious lung nodules. Upper Abdomen: Small volume dependent gallstones/sludge noted without imaging signs of acute cholecystitis. There is a small sliding hiatal hernia. Remaining visualized upper abdominal viscera within normal limits. Musculoskeletal: The visualized soft tissues of the chest wall are grossly unremarkable. No suspicious osseous lesions. There are mild multilevel degenerative changes in the visualized spine. IMPRESSION: 1. There is small volume occlusive embolism in the left lung lower lobe segmental pulmonary artery branch. No  right heart strain or lung infarction. 2. There is loculated small left and trace right pleural effusion. 3. Multiple other nonacute observations, as described above. Aortic Atherosclerosis (ICD10-I70.0). Electronically Signed: By: Beula Brunswick M.D. On: 04/16/2024 17:55        Serapio Edelson, DO [facility] 04/18/2024, 1:37 PM    Dictation software may have been used to generate the above note. Typos may occur and escape review in typed/dictated notes. Please contact Dr Authur Leghorn directly for clarity if needed.  Staff may message me via secure chat in Epic  but this may not receive an immediate response,  please page me for urgent matters!  If 7PM-7AM, please contact night coverage [email or web address]

## 2024-04-19 ENCOUNTER — Other Ambulatory Visit: Payer: Self-pay

## 2024-04-19 DIAGNOSIS — A419 Sepsis, unspecified organism: Secondary | ICD-10-CM | POA: Diagnosis not present

## 2024-04-19 LAB — URINALYSIS, ROUTINE W REFLEX MICROSCOPIC
Bilirubin Urine: NEGATIVE
Glucose, UA: NEGATIVE mg/dL
Hgb urine dipstick: NEGATIVE
Ketones, ur: NEGATIVE mg/dL
Leukocytes,Ua: NEGATIVE
Nitrite: NEGATIVE
Protein, ur: NEGATIVE mg/dL
Specific Gravity, Urine: 1.02 (ref 1.005–1.030)
pH: 5 (ref 5.0–8.0)

## 2024-04-19 LAB — BASIC METABOLIC PANEL WITH GFR
Anion gap: 10 (ref 5–15)
BUN: 19 mg/dL (ref 8–23)
CO2: 23 mmol/L (ref 22–32)
Calcium: 9 mg/dL (ref 8.9–10.3)
Chloride: 98 mmol/L (ref 98–111)
Creatinine, Ser: 1.16 mg/dL (ref 0.61–1.24)
GFR, Estimated: >60 mL/min (ref >60)
Glucose, Bld: 111 mg/dL — ABNORMAL HIGH (ref 70–99)
Potassium: 4.2 mmol/L (ref 3.5–5.1)
Sodium: 131 mmol/L — ABNORMAL LOW (ref 135–145)

## 2024-04-19 LAB — PHOSPHORUS: Phosphorus: 3.7 mg/dL (ref 2.5–4.6)

## 2024-04-19 LAB — MAGNESIUM: Magnesium: 2 mg/dL (ref 1.7–2.4)

## 2024-04-19 MED ORDER — APIXABAN 5 MG PO TABS
ORAL_TABLET | ORAL | 0 refills | Status: DC
  Filled 2024-04-19: qty 68, 30d supply, fill #0

## 2024-04-19 NOTE — Plan of Care (Signed)
   Problem: Fluid Volume: Goal: Hemodynamic stability will improve Outcome: Progressing   Problem: Clinical Measurements: Goal: Diagnostic test results will improve Outcome: Progressing Goal: Signs and symptoms of infection will decrease Outcome: Progressing   Problem: Respiratory: Goal: Ability to maintain adequate ventilation will improve Outcome: Progressing

## 2024-04-19 NOTE — TOC Progression Note (Addendum)
 Transition of Care [facility]) - Progression Note    Patient Details  Name: Todd Rojas MRN: [id number] Date of Birth: [birth year]  Transition of Care [facility]) CM/SW Contact  Odilia Bennett, LCSW Phone Number: 04/19/2024, 9:41 AM  Clinical Narrative:  SNF insurance authorization is still pending.   12:19 pm: Auth still pending.  12:39 pm: [facility] is offering a peer-to-peer review. Attending physician is aware and will review.  2:59 pm: Left wife a voicemail to provide update.  3:56 pm: Attending physician completed peer-to-peer and stated [facility] is going to deny SNF authorization. CSW has not received official denial yet. Patient and wife are aware and would like to move forward with discharge home with home health. CSW left voicemail for [facility] liaison.  4:02 pm: Received call back from [employer]. Provided update. Siegfried Dress is still pending in [employer] portal.  4:34 pm: Received auth denial in [employer] portal.  Expected Discharge Plan: Home w Home Health Services Barriers to Discharge: Barriers Resolved  Expected Discharge Plan and Services       Living arrangements for the past 2 months: Single Family Home Expected Discharge Date: 04/12/24                         HH Arranged: RN, PT, OT [facility] Agency: [facility] Date [facility] Agency Contacted: 04/12/24   Representative spoke with at [facility] Agency: [place]    Social Determinants of Health (SDOH) Interventions SDOH Screenings   Food Insecurity: No Food Insecurity (04/12/2024)  Housing: Patient Declined (04/12/2024)  Transportation Needs: No Transportation Needs (04/12/2024)  Utilities: Not At Risk (04/12/2024)  Depression (PHQ2-9): Low Risk  (07/12/2021)  Financial Resource Strain: Low Risk  (10/23/2023)   Received from [facility]  Social Connections: Unknown (04/12/2024)  Tobacco Use: Medium Risk (04/11/2024)    Readmission Risk Interventions     No data to display

## 2024-04-19 NOTE — Care Management (Signed)
 Called [employer] 04/19/24 12:44 PM for peer to peer  [phone]  Spoke w/ Pam She states I will get a call back from medical director sometime today or Monday

## 2024-04-19 NOTE — Discharge Summary (Signed)
 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  He reports that his ileostomy output is very liquid whereas previously it was more solid.  Also has left mid back pain that started in the past 24 hours.   04/25: admitted to hospitalist service question of sepsis d/t GI source. Pain improved over course of the day and plan was  for d/c home but episode of orthostatic hypotension. GI panel neg 04/26: hypotensive despite IVF and midodrine  . Urine cultures return positive GNR. Cardiology consult, no new workup 04/27: UCx Klebsiella 04/28: transition to po abx. Cardio s/o 04/29: PT/OT reeval now meeting criteria for SNF 04/30-05/01: SNF placement pending 05/02: SNF denied but pt is walking better today, ok for discharge      Consultants:  Cardiology   Procedures/Surgeries: none      ASSESSMENT & PLAN:    SIRS d/t diarrhea/pain Sepsis ruled out Klebsiella UTI vs question asymptomatic bateriuria No hydronephrosis or signs of pyelonephritis on CT. Klebsiella - s/p bactrim  per sensitivities.    small PE in LLL may be contributing to the pain. Respiratory status stable ORA. Considered provoked given recent surgery/hospitalization.  Started eliquis  x90 days with loading dose.    L flank/rib pain- intermittent. Tender to touch. Worse at night. Suspect related to post-op CO2, digestive gas heating pad, voltaren  gel, tylenol  simethicone  PRN   Orthostatic hypotension- related to hypovolemia. Troponins 43>32. S/p IV fluids Improving. Not symptomatic today with position changes. Will get repeat orthostatics prior to dc continue midodrine  cardiology consulted per family request and has signed off ted stockings   AKI- related to hypotension, UTI- resolved follow BMP.   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 Outpatient follow up with surgery was scheduled for 5/5. Consider inpatient consult if not stable output Negative GI panel and C. Difficile Has significant amount of gas requiring frequent "burping" of ileostomy bag - Simethicone  PRN   CAD (  coronary artery disease) Continue aspirin , rosuvastatin   Lisinopril , amlodipine  held No complaints of chest pain and EKG nonacute Cardiology has signed off   COPD (chronic obstructive pulmonary disease) Not acutely  exacerbated Continue home inhaler   Hyperlipidemia, unspecified Continue rosuvastatin    Thyroid  nodule greater than or equal to 1.5 cm in diameter incidentally noted on imaging study For outpatient workup   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 Outpatient workup, PSA correlation recommended by radiologist   overweight based on BMI: Body mass index is 25.55 kg/m.[name] Significantly low or high BMI is associated with higher medical risk.  Underweight - under 18  overweight - 25 to 29 obese - 30 or more Class 1 obesity: BMI of 30.0 to 34 Class 2 obesity: BMI of 35.0 to 39 Class 3 obesity: BMI of 40.0 to 49 Super Morbid Obesity: BMI 50-59 Super-super Morbid Obesity: BMI 60+ Healthy nutrition and physical activity advised as adjunct to other disease management and risk reduction treatments               Discharge Instructions  Allergies as of 04/19/2024   No Known Allergies      Medication List     PAUSE taking these medications    amLODipine  5 MG tablet Wait to take this until your doctor or other care provider tells you to start again. Commonly known as: NORVASC  Take 1 tablet (5 mg total) by mouth daily.   aspirin  EC 81 MG tablet Wait to take this until your doctor or other care provider tells you to start again. Take 81 mg by mouth daily.   furosemide  20 MG tablet Wait to take this until your doctor or other care provider tells you to start again. Commonly known as: LASIX  Take 20 mg by mouth daily.   lisinopril  20 MG tablet Wait to take this until your doctor or other care provider tells you to start again. Commonly known as: ZESTRIL  Take 20 mg by mouth daily.       STOP taking these medications    gabapentin 300 MG capsule Commonly known as: NEURONTIN       TAKE these medications    acetaminophen  500 MG tablet Commonly known as: TYLENOL  Take 1,000 mg by mouth every 8 (eight) hours as needed for mild pain.   albuterol  108 (90 Base) MCG/ACT  inhaler Commonly known as: VENTOLIN  HFA Inhale 1 puff into the lungs every 6 (six) hours as needed for wheezing or shortness of breath.   allopurinol  300 MG tablet Commonly known as: ZYLOPRIM  Take 300 mg by mouth daily.   baclofen  10 MG tablet Commonly known as: LIORESAL  Take 1 tablet (10 mg total) by mouth 3 (three) times daily as needed for muscle spasms.   budesonide -formoterol  160-4.5 MCG/ACT inhaler Commonly known as: SYMBICORT  Inhale 2 puffs into the lungs 2 (two) times daily.   colchicine 0.6 MG tablet Take 0.6 mg by mouth daily.   cyanocobalamin  1000 MCG tablet Commonly known as: VITAMIN B12 Take 1,000 mcg by mouth daily.   diclofenac  Sodium 1 % Gel Commonly known as: VOLTAREN  Apply 2 g topically 3 (three) times daily.   Eliquis  5 MG Tabs tablet Generic drug: apixaban  Take 2 tablets (10 mg total) by mouth 2 (two) times daily for 4 days, THEN 1 tablet (5 mg total) 2 (two) times daily for 26 days. Start taking on: Apr 19, 2024   Melatonin 10 MG Tabs Take 5-10 mg by mouth at bedtime.   multivitamin with minerals Tabs tablet Take 1 tablet  by mouth daily.   nitroGLYCERIN  0.4 MG SL tablet Commonly known as: NITROSTAT  Place under the tongue.   ondansetron  4 MG disintegrating tablet Commonly known as: ZOFRAN -ODT Take 1 tablet (4 mg total) by mouth every 8 (eight) hours as needed for nausea or vomiting.   pantoprazole  40 MG tablet Commonly known as: PROTONIX  Take 1 tablet (40 mg total) by mouth daily.   rosuvastatin  20 MG tablet Commonly known as: CRESTOR  Take 20 mg by mouth daily.   VanishPoint Tuberculin Syringe 25G X 1" 1 ML Misc Generic drug: Tuberculin-Allergy Syringes         Contact information for follow-up providers     Antonio Baumgarten, MD. Go on 04/19/2024.   Specialty: Internal Medicine Why: Go @ 11:15am. Contact information: [address] [place] [address] [phone]         Mima Alstrom, PA-C. Go in 1 week(s).    Specialty: Internal Medicine Contact information: [address] [place] [address] [phone]              Contact information for after-discharge care     Destination                          No Known Allergies   Subjective: pt has no concerns, feeling a bit better today, denies CP/SOB, report tolerating diet, ambulating w/ walker w/ PT    Discharge Exam: BP 115/71 (BP Location: Right Arm)   Pulse 81   Temp (!) 97.5 F (36.4 C) (Oral)   Resp 17   Ht 5\' 9"  (1.753 m)   Wt 76.8 kg   SpO2 94%   BMI 25.00 kg/m  General: Pt is alert, awake, not in acute distress Cardiovascular: RRR, S1/S2 +, no rubs, no gallops Respiratory: CTA bilaterally, no wheezing, no rhonchi Abdominal: Soft, NT, ND, bowel sounds + Extremities: no edema, no cyanosis     The results of significant diagnostics from this hospitalization (including imaging, microbiology, ancillary and laboratory) are listed below for reference.     Microbiology: Recent Results (from the past 240 hours)  Blood Culture (routine x 2)     Status: None   Collection Time: 04/11/24 11:49 PM   Specimen: BLOOD  Result Value Ref Range Status   Specimen Description BLOOD RIGHT HAND  Final   Special Requests   Final    BOTTLES DRAWN AEROBIC AND ANAEROBIC Blood Culture results may not be optimal due to an inadequate volume of blood received in culture bottles   Culture   Final    NO GROWTH 5 DAYS Performed at [facility], [address] [place], [place] [address]    Report Status 04/16/2024 FINAL  Final  Blood Culture (routine x 2)     Status: None   Collection Time: 04/11/24 11:49 PM   Specimen: BLOOD  Result Value Ref Range Status   Specimen Description BLOOD LEFT FA  Final   Special Requests   Final    BOTTLES DRAWN AEROBIC AND ANAEROBIC Blood Culture results may not be optimal due to an inadequate volume of blood received in culture bottles   Culture   Final    NO GROWTH 5  DAYS Performed at [facility], [address] [place], [place] [address]    Report Status 04/16/2024 FINAL  Final  Gastrointestinal Panel by PCR , Stool     Status: None   Collection Time: 04/11/24 11:49 PM   Specimen: Stool  Result Value Ref Range Status   Campylobacter species NOT DETECTED NOT DETECTED Final   Plesimonas shigelloides NOT DETECTED NOT DETECTED Final   Salmonella species NOT DETECTED NOT DETECTED Final   Yersinia enterocolitica NOT DETECTED NOT DETECTED Final   Vibrio species NOT DETECTED NOT DETECTED Final   Vibrio cholerae NOT DETECTED NOT DETECTED Final   Enteroaggregative E coli (EAEC) NOT DETECTED NOT DETECTED Final   Enteropathogenic E coli (EPEC) NOT DETECTED NOT DETECTED Final   Enterotoxigenic E coli (ETEC) NOT DETECTED NOT DETECTED Final   Shiga like toxin producing E coli (STEC) NOT DETECTED NOT DETECTED Final   Shigella/Enteroinvasive E coli (EIEC) NOT DETECTED NOT DETECTED Final   Cryptosporidium NOT DETECTED NOT DETECTED Final   Cyclospora cayetanensis NOT DETECTED NOT DETECTED Final   Entamoeba histolytica NOT DETECTED NOT DETECTED Final   Giardia lamblia NOT DETECTED NOT DETECTED Final   Adenovirus F40/41 NOT DETECTED NOT DETECTED Final   Astrovirus NOT DETECTED NOT DETECTED Final   Norovirus GI/GII NOT DETECTED NOT DETECTED Final   Rotavirus A NOT DETECTED NOT DETECTED Final   Sapovirus (I, II, IV, and V) NOT DETECTED NOT DETECTED Final    Comment: Performed at [facility], [address] [place], [place] [address]  C Difficile Quick Screen w PCR reflex     Status: None   Collection Time: 04/11/24 11:49 PM   Specimen: Stool  Result Value Ref Range Status   C Diff antigen NEGATIVE NEGATIVE Final   C Diff toxin NEGATIVE NEGATIVE Final   C Diff interpretation No C. difficile detected.  Final    Comment: Performed at [facility], [address] [place], [place] [address]  Resp panel by RT-PCR (RSV, Flu A&B, Covid)  Anterior Nasal Swab     Status: None   Collection Time: 04/11/24 11:49 PM   Specimen: Anterior Nasal Swab  Result Value Ref Range Status   SARS Coronavirus 2 by RT PCR NEGATIVE NEGATIVE Final    Comment: (NOTE) SARS-CoV-2 target nucleic acids are NOT DETECTED.  The SARS-CoV-2 RNA is generally detectable in upper respiratory specimens during the acute phase of infection. The lowest concentration of SARS-CoV-2 viral copies this assay can detect is 138 copies/mL. A negative result does not preclude SARS-Cov-2 infection and should not be used as the sole basis for treatment or other patient management decisions. A negative result may occur with  improper specimen collection/handling, submission of specimen other than nasopharyngeal swab, presence of viral mutation(s) within the areas targeted by this assay, and inadequate number of viral copies(<138 copies/mL). A negative result must be combined with clinical observations, patient history, and epidemiological information. The expected result is Negative.  Fact Sheet for Patients:  BloggerCourse.com  Fact Sheet for Healthcare Providers:  SeriousBroker.it  This test is no t yet approved or cleared by the [place]  FDA and  has been authorized for detection and/or diagnosis of SARS-CoV-2 by FDA under an Emergency Use Authorization (EUA). This EUA will remain  in effect (meaning this test can be used) for the duration of the COVID-19 declaration under Section 564(b)(1) of the Act, 21 U.S.C.section 360bbb-3(b)(1), unless the authorization is terminated  or revoked sooner.       Influenza A by PCR NEGATIVE NEGATIVE Final   Influenza B by PCR NEGATIVE NEGATIVE Final    Comment: (NOTE) The Xpert Xpress SARS-CoV-2/FLU/RSV plus assay is intended as an aid in the diagnosis of influenza from Nasopharyngeal swab specimens and should not be used as  a sole basis for treatment. Nasal washings  and aspirates are unacceptable for Xpert Xpress SARS-CoV-2/FLU/RSV testing.  Fact Sheet for Patients: BloggerCourse.com  Fact Sheet for Healthcare Providers: SeriousBroker.it  This test is not yet approved or cleared by the [place]  FDA and has been authorized for detection and/or diagnosis of SARS-CoV-2 by FDA under an Emergency Use Authorization (EUA). This EUA will remain in effect (meaning this test can be used) for the duration of the COVID-19 declaration under Section 564(b)(1) of the Act, 21 U.S.C. section 360bbb-3(b)(1), unless the authorization is terminated or revoked.     Resp Syncytial Virus by PCR NEGATIVE NEGATIVE Final    Comment: (NOTE) Fact Sheet for Patients: BloggerCourse.com  Fact Sheet for Healthcare Providers: SeriousBroker.it  This test is not yet approved or cleared by the [place]  FDA and has been authorized for detection and/or diagnosis of SARS-CoV-2 by FDA under an Emergency Use Authorization (EUA). This EUA will remain in effect (meaning this test can be used) for the duration of the COVID-19 declaration under Section 564(b)(1) of the Act, 21 U.S.C. section 360bbb-3(b)(1), unless the authorization is terminated or revoked.  Performed at [facility], [address] [place], [place] [address]   Urine Culture     Status: Abnormal   Collection Time: 04/12/24  2:15 AM   Specimen: Urine, Clean Catch  Result Value Ref Range Status   Specimen Description   Final    URINE, CLEAN CATCH Performed at [facility], [address], [place], [place] [address]    Special Requests   Final    NONE Performed at [facility], [address] [place], [place] [address]    Culture 20,000 COLONIES/mL KLEBSIELLA PNEUMONIAE (A)  Final   Report Status 04/14/2024 FINAL  Final   Organism ID, Bacteria KLEBSIELLA PNEUMONIAE (A)   Final      Susceptibility   Klebsiella pneumoniae - MIC*    AMPICILLIN >=32 RESISTANT Resistant     CEFAZOLIN  <=4 SENSITIVE Sensitive     CEFEPIME  <=0.12 SENSITIVE Sensitive     CEFTRIAXONE  <=0.25 SENSITIVE Sensitive     CIPROFLOXACIN  <=0.25 SENSITIVE Sensitive     GENTAMICIN <=1 SENSITIVE Sensitive     IMIPENEM <=0.25 SENSITIVE Sensitive     NITROFURANTOIN 128 RESISTANT Resistant     TRIMETH /SULFA  <=20 SENSITIVE Sensitive     AMPICILLIN/SULBACTAM 16 INTERMEDIATE Intermediate     PIP/TAZO 16 SENSITIVE Sensitive ug/mL    * 20,000 COLONIES/mL KLEBSIELLA PNEUMONIAE  MRSA Next Gen by PCR, Nasal     Status: None   Collection Time: 04/13/24  3:25 AM   Specimen: Nasal Mucosa; Nasal Swab  Result Value Ref Range Status   MRSA by PCR Next Gen NOT DETECTED NOT DETECTED Final    Comment: (NOTE) The GeneXpert MRSA Assay (FDA approved for NASAL specimens only), is one component of a comprehensive MRSA colonization surveillance program. It is not intended to diagnose MRSA infection nor to guide or monitor treatment for MRSA infections. Test performance is not FDA approved in patients less than 9 years old. Performed at [facility], [address] [place], [place] [address]      Labs: BNP (last 3 results) Recent Labs    03/09/24 1145  BNP 145.6*   Basic Metabolic Panel: Recent Labs  Lab 04/13/24 0546 04/14/24 0525 04/15/24 0555 04/17/24 0614 04/18/24 0550 04/19/24 0544  NA 133* 131* 135 131* 130* 131*  K 4.5 4.5 4.1 4.0 3.9 4.2  CL 107 107 106  98 99 98  CO2 19* 20* 22 23 26 23   GLUCOSE 99 103* 96 88 98 111*  BUN 37* 33* 22 14 15 19   CREATININE 2.68* 1.48* 1.09 1.06 1.21 1.16  CALCIUM  7.9* 8.1* 8.5* 8.8* 8.3* 9.0  MG 1.6*  --   --   --   --  2.0  PHOS  --   --   --   --   --  3.7   Liver Function Tests: No results for input(s): "AST", "ALT", "ALKPHOS", "BILITOT", "PROT", "ALBUMIN " in the last 168 hours. No results for input(s): "LIPASE", "AMYLASE" in the last  168 hours. No results for input(s): "AMMONIA" in the last 168 hours. CBC: Recent Labs  Lab 04/12/24 1720 04/13/24 0546 04/14/24 0525 04/15/24 0555 04/17/24 0614  WBC 23.0* 18.0* 12.4* 7.7 6.9  HGB 11.1* 9.4* 9.8* 10.2* 11.8*  HCT 33.9* 28.5* 30.1* 30.2* 34.7*  MCV 99.1 98.3 99.7 96.5 93.5  PLT 278 221 212 246 287   Cardiac Enzymes: No results for input(s): "CKTOTAL", "CKMB", "CKMBINDEX", "TROPONINI" in the last 168 hours. BNP: Invalid input(s): "POCBNP" CBG: Recent Labs  Lab 04/12/24 1650 04/12/24 2105 04/13/24 0930 04/13/24 1200 04/13/24 1658  GLUCAP 133* 122* 133* 109* 101*   D-Dimer No results for input(s): "DDIMER" in the last 72 hours. Hgb A1c No results for input(s): "HGBA1C" in the last 72 hours. Lipid Profile No results for input(s): "CHOL", "HDL", "LDLCALC", "TRIG", "CHOLHDL", "LDLDIRECT" in the last 72 hours. Thyroid  function studies No results for input(s): "TSH", "T4TOTAL", "T3FREE", "THYROIDAB" in the last 72 hours.  Invalid input(s): "FREET3" Anemia work up No results for input(s): "VITAMINB12", "FOLATE", "FERRITIN", "TIBC", "IRON", "RETICCTPCT" in the last 72 hours. Urinalysis    Component Value Date/Time   COLORURINE YELLOW (A) 04/12/2024 0215   APPEARANCEUR HAZY (A) 04/12/2024 0215   APPEARANCEUR Cloudy (A) 08/23/2017 1018   LABSPEC 1.040 (H) 04/12/2024 0215   PHURINE 5.0 04/12/2024 0215   GLUCOSEU NEGATIVE 04/12/2024 0215   HGBUR NEGATIVE 04/12/2024 0215   BILIRUBINUR NEGATIVE 04/12/2024 0215   BILIRUBINUR Negative 08/23/2017 1018   KETONESUR NEGATIVE 04/12/2024 0215   PROTEINUR NEGATIVE 04/12/2024 0215   NITRITE NEGATIVE 04/12/2024 0215   LEUKOCYTESUR NEGATIVE 04/12/2024 0215   Sepsis Labs Recent Labs  Lab 04/13/24 0546 04/14/24 0525 04/15/24 0555 04/17/24 0614  WBC 18.0* 12.4* 7.7 6.9   Microbiology Recent Results (from the past 240 hours)  Blood Culture (routine x 2)     Status: None   Collection Time: 04/11/24 11:49 PM    Specimen: BLOOD  Result Value Ref Range Status   Specimen Description BLOOD RIGHT HAND  Final   Special Requests   Final    BOTTLES DRAWN AEROBIC AND ANAEROBIC Blood Culture results may not be optimal due to an inadequate volume of blood received in culture bottles   Culture   Final    NO GROWTH 5 DAYS Performed at [facility], [address] [place], [place] [address]    Report Status 04/16/2024 FINAL  Final  Blood Culture (routine x 2)     Status: None   Collection Time: 04/11/24 11:49 PM   Specimen: BLOOD  Result Value Ref Range Status   Specimen Description BLOOD LEFT FA  Final   Special Requests   Final    BOTTLES DRAWN AEROBIC AND ANAEROBIC Blood Culture results may not be optimal due to an inadequate volume of blood received in culture bottles   Culture   Final    NO  GROWTH 5 DAYS Performed at [facility], [address] [place], [place] [address]    Report Status 04/16/2024 FINAL  Final  Gastrointestinal Panel by PCR , Stool     Status: None   Collection Time: 04/11/24 11:49 PM   Specimen: Stool  Result Value Ref Range Status   Campylobacter species NOT DETECTED NOT DETECTED Final   Plesimonas shigelloides NOT DETECTED NOT DETECTED Final   Salmonella species NOT DETECTED NOT DETECTED Final   Yersinia enterocolitica NOT DETECTED NOT DETECTED Final   Vibrio species NOT DETECTED NOT DETECTED Final   Vibrio cholerae NOT DETECTED NOT DETECTED Final   Enteroaggregative E coli (EAEC) NOT DETECTED NOT DETECTED Final   Enteropathogenic E coli (EPEC) NOT DETECTED NOT DETECTED Final   Enterotoxigenic E coli (ETEC) NOT DETECTED NOT DETECTED Final   Shiga like toxin producing E coli (STEC) NOT DETECTED NOT DETECTED Final   Shigella/Enteroinvasive E coli (EIEC) NOT DETECTED NOT DETECTED Final   Cryptosporidium NOT DETECTED NOT DETECTED Final   Cyclospora cayetanensis NOT DETECTED NOT DETECTED Final   Entamoeba histolytica NOT DETECTED NOT DETECTED Final    Giardia lamblia NOT DETECTED NOT DETECTED Final   Adenovirus F40/41 NOT DETECTED NOT DETECTED Final   Astrovirus NOT DETECTED NOT DETECTED Final   Norovirus GI/GII NOT DETECTED NOT DETECTED Final   Rotavirus A NOT DETECTED NOT DETECTED Final   Sapovirus (I, II, IV, and V) NOT DETECTED NOT DETECTED Final    Comment: Performed at [facility], [address] [place], [place] [address]  C Difficile Quick Screen w PCR reflex     Status: None   Collection Time: 04/11/24 11:49 PM   Specimen: Stool  Result Value Ref Range Status   C Diff antigen NEGATIVE NEGATIVE Final   C Diff toxin NEGATIVE NEGATIVE Final   C Diff interpretation No C. difficile detected.  Final    Comment: Performed at [facility], [address] [place], [place] [address]  Resp panel by RT-PCR (RSV, Flu A&B, Covid) Anterior Nasal Swab     Status: None   Collection Time: 04/11/24 11:49 PM   Specimen: Anterior Nasal Swab  Result Value Ref Range Status   SARS Coronavirus 2 by RT PCR NEGATIVE NEGATIVE Final    Comment: (NOTE) SARS-CoV-2 target nucleic acids are NOT DETECTED.  The SARS-CoV-2 RNA is generally detectable in upper respiratory specimens during the acute phase of infection. The lowest concentration of SARS-CoV-2 viral copies this assay can detect is 138 copies/mL. A negative result does not preclude SARS-Cov-2 infection and should not be used as the sole basis for treatment or other patient management decisions. A negative result may occur with  improper specimen collection/handling, submission of specimen other than nasopharyngeal swab, presence of viral mutation(s) within the areas targeted by this assay, and inadequate number of viral copies(<138 copies/mL). A negative result must be combined with clinical observations, patient history, and epidemiological information. The expected result is Negative.  Fact Sheet for Patients:  BloggerCourse.com  Fact Sheet  for Healthcare Providers:  SeriousBroker.it  This test is no t yet approved or cleared by the [place]  FDA and  has been authorized for detection and/or diagnosis of SARS-CoV-2 by FDA under an Emergency Use Authorization (EUA). This EUA will remain  in effect (meaning this test can be used) for the duration of the COVID-19 declaration under Section 564(b)(1) of the Act, 21 U.S.C.section 360bbb-3(b)(1), unless the authorization is terminated  or revoked sooner.  Influenza A by PCR NEGATIVE NEGATIVE Final   Influenza B by PCR NEGATIVE NEGATIVE Final    Comment: (NOTE) The Xpert Xpress SARS-CoV-2/FLU/RSV plus assay is intended as an aid in the diagnosis of influenza from Nasopharyngeal swab specimens and should not be used as a sole basis for treatment. Nasal washings and aspirates are unacceptable for Xpert Xpress SARS-CoV-2/FLU/RSV testing.  Fact Sheet for Patients: BloggerCourse.com  Fact Sheet for Healthcare Providers: SeriousBroker.it  This test is not yet approved or cleared by the [place]  FDA and has been authorized for detection and/or diagnosis of SARS-CoV-2 by FDA under an Emergency Use Authorization (EUA). This EUA will remain in effect (meaning this test can be used) for the duration of the COVID-19 declaration under Section 564(b)(1) of the Act, 21 U.S.C. section 360bbb-3(b)(1), unless the authorization is terminated or revoked.     Resp Syncytial Virus by PCR NEGATIVE NEGATIVE Final    Comment: (NOTE) Fact Sheet for Patients: BloggerCourse.com  Fact Sheet for Healthcare Providers: SeriousBroker.it  This test is not yet approved or cleared by the [place]  FDA and has been authorized for detection and/or diagnosis of SARS-CoV-2 by FDA under an Emergency Use Authorization (EUA). This EUA will remain in effect (meaning  this test can be used) for the duration of the COVID-19 declaration under Section 564(b)(1) of the Act, 21 U.S.C. section 360bbb-3(b)(1), unless the authorization is terminated or revoked.  Performed at [facility], [address] [place], [place] [address]   Urine Culture     Status: Abnormal   Collection Time: 04/12/24  2:15 AM   Specimen: Urine, Clean Catch  Result Value Ref Range Status   Specimen Description   Final    URINE, CLEAN CATCH Performed at [facility], [address] [place], [place] [address]    Special Requests   Final    NONE Performed at [facility], [address] [place], [place] [address]    Culture 20,000 COLONIES/mL KLEBSIELLA PNEUMONIAE (A)  Final   Report Status 04/14/2024 FINAL  Final   Organism ID, Bacteria KLEBSIELLA PNEUMONIAE (A)  Final      Susceptibility   Klebsiella pneumoniae - MIC*    AMPICILLIN >=32 RESISTANT Resistant     CEFAZOLIN  <=4 SENSITIVE Sensitive     CEFEPIME  <=0.12 SENSITIVE Sensitive     CEFTRIAXONE  <=0.25 SENSITIVE Sensitive     CIPROFLOXACIN  <=0.25 SENSITIVE Sensitive     GENTAMICIN <=1 SENSITIVE Sensitive     IMIPENEM <=0.25 SENSITIVE Sensitive     NITROFURANTOIN 128 RESISTANT Resistant     TRIMETH /SULFA  <=20 SENSITIVE Sensitive     AMPICILLIN/SULBACTAM 16 INTERMEDIATE Intermediate     PIP/TAZO 16 SENSITIVE Sensitive ug/mL    * 20,000 COLONIES/mL KLEBSIELLA PNEUMONIAE  MRSA Next Gen by PCR, Nasal     Status: None   Collection Time: 04/13/24  3:25 AM   Specimen: Nasal Mucosa; Nasal Swab  Result Value Ref Range Status   MRSA by PCR Next Gen NOT DETECTED NOT DETECTED Final    Comment: (NOTE) The GeneXpert MRSA Assay (FDA approved for NASAL specimens only), is one component of a comprehensive MRSA colonization surveillance program. It is not intended to diagnose MRSA infection nor to guide or monitor treatment for MRSA infections. Test performance is not FDA approved in patients less  than 14 years old. Performed at [facility], [address] [place], [place] [address]    Imaging CT ABDOMEN PELVIS W CONTRAST Result Date: 04/12/2024 CLINICAL DATA:  Recent abdominal surgery presenting with fever, leukocytosis and vomiting. EXAM: CT ABDOMEN AND PELVIS WITH CONTRAST TECHNIQUE: Multidetector CT imaging of the abdomen and pelvis was performed using the standard protocol following bolus administration of intravenous contrast. RADIATION DOSE REDUCTION: This exam was performed according to the departmental dose-optimization program which includes automated exposure control, adjustment of the mA and/or kV according to patient size and/or use of iterative reconstruction technique. CONTRAST:  OMNIPAQUE  IOHEXOL  300 MG/ML  SOLN COMPARISON:  April 02, 2024 FINDINGS: Lower chest: Stable moderate to marked severity areas of atelectasis and/or infiltrate are seen within the posterior aspects of the bilateral lung bases. Hepatobiliary: A stable 3 mm focus of parenchymal low attenuation is seen within the anterior aspect of the left lobe of the liver (axial CT image 25, CT series 2). No gallstones, gallbladder wall thickening, or biliary dilatation. Pancreas: Unremarkable. No pancreatic ductal dilatation or surrounding inflammatory changes. Spleen: Normal in size without focal abnormality. Adrenals/Urinary Tract: Adrenal glands are unremarkable. Kidneys are normal in size, without renal calculi or hydronephrosis. A stable simple cyst is seen within the right kidney. Areas of focal cortical scarring are noted within the anterolateral aspect of the left kidney. There is moderate severity dilatation of a short segment of the distal left ureter which extends to the level of the left UVJ. Bladder is unremarkable. Stomach/Bowel: The enteric tube seen on the prior study has been removed. There is a very small hiatal hernia. Appendix appears normal. No evidence of bowel wall thickening, distention,  or inflammatory changes. Noninflamed diverticula are seen scattered throughout the sigmoid colon. Vascular/Lymphatic: Aortic atherosclerosis. No enlarged abdominal or pelvic lymph nodes. Reproductive: There is moderate to marked severity prostate gland enlargement. A stable 2.3 cm x 1.8 cm x 2.8 cm area of increased attenuation (approximately 124.31 Hounsfield units) is seen within the posterolateral aspect of the prostate gland on the right (axial CT image 95, CT series 2) Other: A right pelvic ostomy site is again seen. Small, stable bilateral fat containing inguinal hernias are also noted. No abdominopelvic ascites. Musculoskeletal: Multilevel degenerative changes are seen throughout the lumbar spine. IMPRESSION: 1. Stable moderate to marked severity posterior bibasilar atelectasis and/or infiltrate. 2. 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 3. Prostatomegaly with an area of increased parenchymal attenuation which may represent abnormal parenchymal enhancement. Correlation with PSA levels is recommended. Electronically Signed   By: Virgle Grime M.D.   On: 04/12/2024 01:43   DG Chest 2 View Result Date: 04/12/2024 CLINICAL DATA:  Possible sepsis EXAM: CHEST - 2 VIEW COMPARISON:  04/05/2024 FINDINGS: Elevation of the right diaphragm with atelectasis or scar at the right base. No focal consolidation, pleural effusion or pneumothorax. Stable cardiomediastinal silhouette. IMPRESSION: No active cardiopulmonary disease. Elevation of the right diaphragm with atelectasis or scar at the right base. Electronically Signed   By: Esmeralda Hedge M.D.   On: 04/12/2024 00:20      Time coordinating discharge: over 30 minutes  SIGNED:  Selicia Windom DO [facility]

## 2024-04-22 ENCOUNTER — Encounter: Admitting: Surgery

## 2024-04-30 ENCOUNTER — Telehealth: Payer: Self-pay

## 2024-04-30 NOTE — Telephone Encounter (Signed)
 Patient wife called stating she needs the wax rings to go over his stomach.  Patients wife verbalized the part of his stomach is red and cannot put a bag over it with the rings and is concerned.  Patients wife understands his appointment is tomorrow.  Please f/u with patients wife

## 2024-04-30 NOTE — Telephone Encounter (Signed)
Left message for the patients wife to call back. 

## 2024-05-01 ENCOUNTER — Encounter: Payer: Self-pay | Admitting: Surgery

## 2024-05-01 ENCOUNTER — Ambulatory Visit (INDEPENDENT_AMBULATORY_CARE_PROVIDER_SITE_OTHER): Admitting: Surgery

## 2024-05-01 VITALS — BP 128/70 | HR 94 | Temp 97.9°F | Ht 69.0 in | Wt 169.2 lb

## 2024-05-01 DIAGNOSIS — Z932 Ileostomy status: Secondary | ICD-10-CM

## 2024-05-01 DIAGNOSIS — Z432 Encounter for attention to ileostomy: Secondary | ICD-10-CM

## 2024-05-01 DIAGNOSIS — K5981 Ogilvie syndrome: Secondary | ICD-10-CM

## 2024-05-01 NOTE — Patient Instructions (Signed)
 Ileostomy Home Guide  An ileostomy is a surgically created opening (ostomy) that brings a piece of the small intestine (ileum) through an opening in the abdomen to create a stoma. The stoma allows poop (stool) and gas (flatus) to leave the body. An ostomy pouch or bag is used to cover the stoma and to contain the poop and gas. An ileostomy may be temporary or permanent, depending on the medical reason for your surgery. After having this surgery, you will need to empty and change your ileostomy pouch as needed. You will also need to take steps to care for the stoma. There are many different types of pouching options. Your health care provider will help you select the best pouch for you. What are the risks? Your health care provider will talk with you about risks. These may include: Skin irritation around the stoma if the barrier is cut larger than the stoma. Leaks in the pouching system. Narrowing (constriction) of the stoma if the barrier is cut smaller than the stoma. Supplies needed: One- or two-piece pouching system. Curved scissors to cut the ostomy barrier to fit your stoma, if needed. Ostomy belt, if needed. Any other items your health care provider has recommended, such as ostomy powder, paste, or skin barrier film. Soft washcloth or soft paper towel. How to care for the stoma Your stoma should look pink, red, and moist, like the inside of your cheek. It should not be painful to touch, but it may bleed easily when rubbed or bumped. Soon after surgery, the stoma may be swollen. This usually goes away within 6 weeks.  To care for the stoma: Keep the skin around the stoma clean and dry. Use a clean, soft washcloth or soft paper towel to gently wash the stoma and the skin around it. Use warm water and cleansers recommended by your health care provider. Use stoma powder or skin protectant on your skin as told by your health care provider. Do not use any other powders, gels, wipes, or creams on  the skin around the stoma. These may keep the pouch from sticking or may cause injury to your stoma. Check the stoma area every day for signs of infection or problems. Check for: New or worsening redness, warmth, swelling, irritation, itching, or pain. Changes to drainage from the stoma, such as pus, blood, increased output, or no output. Changes such as wounds on the stoma, dead tissue, or a dark or dusky color. Measure the stoma opening often and record the size. Watch for changes, such as the stoma getting longer, becoming flat, or falling below skin level. Changes in the size or shape of the stoma may require using a different style of pouch. How to care for the ileostomy pouch The pouch that fits over the stoma can have either one or two pieces: One-piece pouch. The skin barrier and the pouch are combined in one unit. Two-piece pouch. The skin barrier and the pouch are separate pieces that attach to each other. Empty your pouch at bedtime, before physical activity or sexual relations, and whenever it is one-third to one-half full. Do not let more poop or gas build up. This could cause the pouch to leak. Some pouches have a built-in gas release valve. Change your pouch every 3-4 days or as often as told by your health care provider. Also change the bag if it is leaking or separating from the skin, or if the skin around the stoma looks or feels irritated. Irritated skin may be a  sign that the bag is leaking. Always have extra ileostomy supplies with you. How to empty the ileostomy pouch You will be taught how to empty your pouch before you leave the hospital. You may be told to: Prepare the toilet or container: If draining directly into the toilet, put several pieces of toilet paper into the toilet water. This will prevent splashing. Sit far back on the toilet seat. If draining into a container, place a few pieces of moistened toilet paper in the bottom of the container. This can help when  emptying the container. If you need to record your output from the stoma, do so before emptying the pouch. Open the end of the pouch, either by removing the clip, removing the hook-and-loop fastener, or opening the drainable spout from the tail end of the pouch. After opening the tail end of the pouch, empty the poop into the toilet or container. Clean the tail with toilet paper or a moist towelette. Reroll the tail. Then close it with the clip or the hook-and-loop fastener or recap the drainable spout. Wash your hands. How to change the ileostomy pouch You will be taught how to change the pouch before you leave the hospital. You may be told to: Wash your hands with soap and water for at least 20 seconds. If soap and water are not available, use hand sanitizer. Lay out your supplies and close the tail of your new pouch. Have paper towels or tissues and a trash bag nearby to clean any discharge and dispose of the soiled pouch. Remove the old pouching appliance. Use your fingers, a warm, moist cloth, or a stoma-safe adhesive remover to gently remove the pouch and remove adhesive residue. Clean the stoma area with water or with mild soap and water, as told. Use water to rinse away any soap. If you have any hair around the stoma, you may be told to carefully shave it. Dry the skin. You may use the cool setting on a hair dryer to do this. Use a tracing pattern (template) to cut the skin barrier to the size needed. The opening should be large enough to fit around the stoma, but you should not see exposed skin. If you are using a two-piece pouch, attach the pouch and the base to each other. Add the barrier ring, if you use one. If directed, apply stoma powder or skin protectant to the skin. Warm the pouch base with your hands or blow with a hair dryer for 5-10 seconds. Remove the paper from the adhesive backing of the pouch base. Press the adhesive backing onto the skin around the stoma. Gently rub the  pouch base onto the skin. This creates heat that helps the barrier to stick. Apply barrier strips to the edges of the pouch, if desired. Dispose of the soiled pouch. Wash your hands. Put your hand over the top of the skin barrier piece to help warm it for about 5 minutes. This helps it conform to your body better.  General tips Avoid wearing tight clothes over your stoma. You can shower with or without the pouch in place. Do not use harsh or oily soaps or lotions. Always keep the pouch on if you are taking a bath or swimming. Do not shower, bathe, or swim right after changing the pouch. Wait 4-5 hours. If your pouch gets wet, you can dry it with a hair dryer on the cool setting. Whenever you leave home, take extra clothing and ostomy supplies with you, including a  skin barrier and pouch. Store all supplies in a cool, dry place. Do not leave supplies in extreme heat. Return to your normal activities as told by your health care provider. Ask your health care provider what activities are safe for you. Where to find more information [employer] [place]: ostomy.org Contact a health care provider if: You have signs of infection, such as: New or worsening redness, warmth, swelling, irritation, itching, or pain around the stoma. A fever. You have changes to your stoma, such as bleeding, trauma, dead tissue, or a dark or dusky color. There are changes to the drainage from the stoma, such as pus, blood, or lasting diarrhea or constipation. Your stoma extends in or out farther than normal. You need to change your bag every day or have frequent leaks. You have abdominal pain, nausea, or bloating. Get help right away if: Your stool is bloody. You vomit more than one time. You have trouble breathing. You feel dizzy or light-headed. These symptoms may be an emergency. Get help right away. Call 911. Do not wait to see if the symptoms will go away. Do not drive yourself to the  hospital. This information is not intended to replace advice given to you by your health care provider. Make sure you discuss any questions you have with your health care provider. Document Revised: 06/08/2022 Document Reviewed: 06/08/2022 Elsevier Patient Education  2024 [employer].

## 2024-05-02 ENCOUNTER — Encounter: Payer: Self-pay | Admitting: Surgery

## 2024-05-02 NOTE — Progress Notes (Signed)
 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  He did have a very rocky hospital course with multiple medical issues. Finally seems to have turned the corner.  He now is eating but probably not enough.  No fevers no chills no vomiting Has taken a while for him to recover  PE NAD Abd: soft, nt, ileostomy pink and patent. Incisions healed  A/P doing well after loop ileostomy.  No evidence of surgical complications.  Encouraged him to increase protein intake.  Also sent referral to ostomy nurse I will see him back in a couple months

## 2024-05-07 ENCOUNTER — Encounter: Payer: Self-pay | Admitting: Dermatology

## 2024-05-07 ENCOUNTER — Ambulatory Visit: Payer: Medicare HMO | Admitting: Dermatology

## 2024-05-07 DIAGNOSIS — L814 Other melanin hyperpigmentation: Secondary | ICD-10-CM

## 2024-05-07 DIAGNOSIS — L570 Actinic keratosis: Secondary | ICD-10-CM | POA: Diagnosis not present

## 2024-05-07 DIAGNOSIS — L821 Other seborrheic keratosis: Secondary | ICD-10-CM

## 2024-05-07 DIAGNOSIS — W908XXA Exposure to other nonionizing radiation, initial encounter: Secondary | ICD-10-CM | POA: Diagnosis not present

## 2024-05-07 DIAGNOSIS — L820 Inflamed seborrheic keratosis: Secondary | ICD-10-CM

## 2024-05-07 DIAGNOSIS — L578 Other skin changes due to chronic exposure to nonionizing radiation: Secondary | ICD-10-CM | POA: Diagnosis not present

## 2024-05-07 DIAGNOSIS — D692 Other nonthrombocytopenic purpura: Secondary | ICD-10-CM

## 2024-05-07 NOTE — Patient Instructions (Addendum)
 If spot at left hand has not gone away by end of July contact us  to have spot rechecked.   Actinic keratoses are precancerous spots that appear secondary to cumulative UV radiation exposure/sun exposure over time. They are chronic with expected duration over 1 year. A portion of actinic keratoses will progress to squamous cell carcinoma of the skin. It is not possible to reliably predict which spots will progress to skin cancer and so treatment is recommended to prevent development of skin cancer.  Recommend daily broad spectrum sunscreen SPF 30+ to sun-exposed areas, reapply every 2 hours as needed.  Recommend staying in the shade or wearing long sleeves, sun glasses (UVA+UVB protection) and wide brim hats (4-inch brim around the entire circumference of the hat). Call for new or changing lesions.   Cryotherapy Aftercare  Wash gently with soap and water everyday.   Apply Vaseline and Band-Aid daily until healed.   Seborrheic Keratosis  What causes seborrheic keratoses? Seborrheic keratoses are harmless, common skin growths that first appear during adult life.  As time goes by, more growths appear.  Some people may develop a large number of them.  Seborrheic keratoses appear on both covered and uncovered body parts.  They are not caused by sunlight.  The tendency to develop seborrheic keratoses can be inherited.  They vary in color from skin-colored to gray, brown, or even black.  They can be either smooth or have a rough, warty surface.   Seborrheic keratoses are superficial and look as if they were stuck on the skin.  Under the microscope this type of keratosis looks like layers upon layers of skin.  That is why at times the top layer may seem to fall off, but the rest of the growth remains and re-grows.    Treatment Seborrheic keratoses do not need to be treated, but can easily be removed in the office.  Seborrheic keratoses often cause symptoms when they rub on clothing or jewelry.  Lesions  can be in the way of shaving.  If they become inflamed, they can cause itching, soreness, or burning.  Removal of a seborrheic keratosis can be accomplished by freezing, burning, or surgery. If any spot bleeds, scabs, or grows rapidly, please return to have it checked, as these can be an indication of a skin cancer.    Due to recent changes in healthcare laws, you may see results of your pathology and/or laboratory studies on MyChart before the doctors have had a chance to review them. We understand that in some cases there may be results that are confusing or concerning to you. Please understand that not all results are received at the same time and often the doctors may need to interpret multiple results in order to provide you with the best plan of care or course of treatment. Therefore, we ask that you please give us  2 business days to thoroughly review all your results before contacting the office for clarification. Should we see a critical lab result, you will be contacted sooner.   If You Need Anything After Your Visit  If you have any questions or concerns for your doctor, please call our main line at [phone] and press option 4 to reach your doctor's medical assistant. If no one answers, please leave a voicemail as directed and we will return your call as soon as possible. Messages left after 4 pm will be answered the following business day.   You may also send us  a message via MyChart. We typically respond  to MyChart messages within 1-2 business days.  For prescription refills, please ask your pharmacy to contact our office. Our fax number is [phone].  If you have an urgent issue when the clinic is closed that cannot wait until the next business day, you can page your doctor at the number below.    Please note that while we do our best to be available for urgent issues outside of office hours, we are not available 24/7.   If you have an urgent issue and are unable to reach us ,  you may choose to seek medical care at your doctor's office, retail clinic, urgent care center, or emergency room.  If you have a medical emergency, please immediately call 911 or go to the emergency department.  Pager Numbers  - Dr. Bary Likes: [phone]  - Dr. Annette Barters: [phone]  - Dr. Felipe Horton: [phone]   In the event of inclement weather, please call our main line at [phone] for an update on the status of any delays or closures.  Dermatology Medication Tips: Please keep the boxes that topical medications come in in order to help keep track of the instructions about where and how to use these. Pharmacies typically print the medication instructions only on the boxes and not directly on the medication tubes.   If your medication is too expensive, please contact our office at [phone] option 4 or send us  a message through MyChart.   We are unable to tell what your co-pay for medications will be in advance as this is different depending on your insurance coverage. However, we may be able to find a substitute medication at lower cost or fill out paperwork to get insurance to cover a needed medication.   If a prior authorization is required to get your medication covered by your insurance company, please allow us  1-2 business days to complete this process.  Drug prices often vary depending on where the prescription is filled and some pharmacies may offer cheaper prices.  The website www.goodrx.com contains coupons for medications through different pharmacies. The prices here do not account for what the cost may be with help from insurance (it may be cheaper with your insurance), but the website can give you the price if you did not use any insurance.  - You can print the associated coupon and take it with your prescription to the pharmacy.  - You may also stop by our office during regular business hours and pick up a GoodRx coupon card.  - If you need your prescription sent  electronically to a different pharmacy, notify our office through [facility] or by phone at [phone] option 4.     Si Usted Necesita Algo Despus de Su Visita  Tambin puede enviarnos un mensaje a travs de Clinical cytogeneticist. Por lo general respondemos a los mensajes de MyChart en el transcurso de 1 a 2 das hbiles.  Para renovar recetas, por favor pida a su farmacia que se ponga en contacto con nuestra oficina. Franz Jacks de fax es [place] [phone].  Si tiene un asunto urgente cuando la clnica est cerrada y que no puede esperar hasta el siguiente da hbil, puede llamar/localizar a su doctor(a) al nmero que aparece a continuacin.   Por favor, tenga en cuenta que aunque hacemos todo lo posible para estar disponibles para asuntos urgentes fuera del horario de [place], no estamos disponibles las 24 horas del da, los 7 [address]  [address] de la [place].   Si tiene un problema urgente y no puede comunicarse con  nosotros, puede optar por buscar atencin mdica  en el consultorio de su doctor(a), en una clnica privada, en un centro de atencin urgente o en una sala de emergencias.  Si tiene Engineer, drilling, por favor llame inmediatamente al 911 o vaya a la sala de emergencias.  Nmeros de bper  - Dr. Bary Likes: [phone]  - Dra. Annette Barters: [phone]  - Dr. Felipe Horton: [phone]   En caso de inclemencias del tiempo, por favor llame a Lajuan Pila principal al [phone] para una actualizacin sobre el [place] de cualquier retraso o cierre.  Consejos para la medicacin en dermatologa: Por favor, guarde las cajas en las que vienen los medicamentos de uso tpico para ayudarle a seguir las instrucciones sobre dnde y cmo usarlos. Las farmacias generalmente imprimen las instrucciones del medicamento slo en las cajas y no directamente en los tubos del [place].   Si su medicamento es muy caro, por favor, pngase en contacto con Bettyjane Brunet llamando al [phone] y presione la  opcin 4 o envenos un mensaje a travs de Clinical cytogeneticist.   No podemos decirle cul ser su copago por los medicamentos por adelantado ya que esto es diferente dependiendo de la cobertura de su seguro. Sin embargo, es posible que podamos encontrar un medicamento sustituto a Audiological scientist un formulario para que el seguro cubra el medicamento que se considera necesario.   Si se requiere una autorizacin previa para que su compaa de seguros [place] su medicamento, por favor permtanos de 1 a 2 das hbiles para completar este proceso.  Los precios de los medicamentos varan con frecuencia dependiendo del Environmental consultant de dnde se surte la receta y alguna farmacias pueden ofrecer precios ms baratos.  El sitio web www.goodrx.com tiene cupones para medicamentos de Health and safety inspector. Los precios aqu no tienen en cuenta lo que podra costar con la ayuda del seguro (puede ser ms barato con su seguro), pero el sitio web puede darle el precio si no utiliz Tourist information centre manager.  - Puede imprimir el cupn correspondiente y llevarlo con su receta a la farmacia.  - Tambin puede pasar por nuestra oficina durante el horario de atencin regular y Education officer, museum una tarjeta de cupones de GoodRx.  - Si necesita que su receta se enve electrnicamente a una farmacia diferente, informe a nuestra oficina a travs de MyChart de [place] o por telfono llamando al [phone] y presione la opcin 4.

## 2024-05-07 NOTE — Progress Notes (Signed)
 Follow-Up Visit   Subjective  Todd Rojas is a 83 y.o. male who presents for the following: 6 month ak follow up, history of spots treated at face, scalp, ears. Today reports a spot near right ear, and right hand.   The patient has spots, moles and lesions to be evaluated, some may be new or changing and the patient may have concern these could be cancer.  The following portions of the chart were reviewed this encounter and updated as appropriate: medications, allergies, medical history  Review of Systems:  No other skin or systemic complaints except as noted in HPI or Assessment and Plan.  Objective  Well appearing patient in no apparent distress; mood and affect are within normal limits.   A focused examination was performed of the following areas: Scalp, face, ears, hands  Relevant exam findings are noted in the Assessment and Plan.  face and hands x 16 (16), left dorum hand x 1 Erythematous thin papules/macules with gritty scale.  neck and right side burn x 3 (3) Erythematous stuck-on, waxy papule or plaque  Assessment & Plan   SEBORRHEIC KERATOSIS - Stuck-on, waxy, tan-brown papules and/or plaques  - Benign-appearing - Discussed benign etiology and prognosis. - Observe - Call for any changes  LENTIGINES Exam: scattered tan macules Due to sun exposure Treatment Plan: Benign-appearing, observe. Recommend daily broad spectrum sunscreen SPF 30+ to sun-exposed areas, reapply every 2 hours as needed.  Call for any changes  Purpura - Chronic; persistent and recurrent.  Treatable, but not curable. - Violaceous macules and patches - Benign - Related to trauma, age, sun damage and/or use of blood thinners, chronic use of topical and/or oral steroids - Observe - Can use OTC arnica containing moisturizer such as Dermend Bruise Formula if desired - Call for worsening or other concerns  ACTINIC DAMAGE - chronic, secondary to cumulative UV radiation exposure/sun  exposure over time - diffuse scaly erythematous macules with underlying dyspigmentation - Recommend daily broad spectrum sunscreen SPF 30+ to sun-exposed areas, reapply every 2 hours as needed.  - Recommend staying in the shade or wearing long sleeves, sun glasses (UVA+UVB protection) and wide brim hats (4-inch brim around the entire circumference of the hat). - Call for new or changing lesions.  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 They are chronic with expected duration over 1 year. A portion of actinic keratoses will progress to squamous cell carcinoma of the skin. It is not possible to reliably predict which spots will progress to skin cancer and so treatment is recommended to prevent development of skin cancer.  Recommend daily broad spectrum sunscreen SPF 30+ to sun-exposed areas, reapply every 2 hours as needed.  Recommend staying in the shade or wearing long sleeves, sun glasses (UVA+UVB protection) and wide brim hats (4-inch brim around the entire circumference of the hat). Call for new or changing lesions. Destruction of lesion - face and hands x 16 (16), left dorum hand x 1 Complexity: simple   Destruction method: cryotherapy   Informed consent: discussed and consent obtained   Timeout:  patient name, date of birth, surgical site, and procedure  verified Lesion destroyed using liquid nitrogen: Yes   Region frozen until ice ball extended beyond lesion: Yes   Outcome: patient tolerated procedure well with no complications   Post-procedure details: wound care instructions given   INFLAMED SEBORRHEIC KERATOSIS (3) neck and  right side burn x 3 (3) Symptomatic, irritating, patient would like treated. Destruction of lesion - neck and right side burn x 3 (3) Complexity: simple   Destruction method: cryotherapy   Informed consent: discussed and consent obtained   Timeout:  patient name, date of birth, surgical site, and procedure verified Lesion destroyed using liquid nitrogen: Yes   Region frozen until ice ball extended beyond lesion: Yes   Outcome: patient tolerated procedure well with no complications   Post-procedure details: wound care instructions given    Return for march tbse .  IRandee Busing, CMA, am acting as scribe for Celine Collard, MD.   Documentation: I have reviewed the above documentation for accuracy and completeness, and I agree with the above.  Celine Collard, MD

## 2024-05-17 ENCOUNTER — Ambulatory Visit (HOSPITAL_COMMUNITY)
Admission: RE | Admit: 2024-05-17 | Discharge: 2024-05-17 | Disposition: A | Discharge status: Home / Self Care | Source: Ambulatory Visit | Attending: Plastic Surgery | Admitting: Plastic Surgery

## 2024-05-17 ENCOUNTER — Ambulatory Visit (HOSPITAL_COMMUNITY): Admitting: Nurse Practitioner

## 2024-05-17 DIAGNOSIS — Z432 Encounter for attention to ileostomy: Secondary | ICD-10-CM

## 2024-05-17 DIAGNOSIS — L24B3 Irritant contact dermatitis related to fecal or urinary stoma or fistula: Secondary | ICD-10-CM

## 2024-05-17 DIAGNOSIS — Z932 Ileostomy status: Secondary | ICD-10-CM | POA: Diagnosis present

## 2024-05-17 NOTE — Progress Notes (Signed)
 [facility]   Reason for visit:  RLQ end ileostomy HPI:  Laparotomy with loop ileostomy  Past Medical History:  Diagnosis Date   Actinic keratosis    Arthritis    Asthma    Basal cell carcinoma 03/17/2020   right ant lat deltoid   CAD (coronary artery disease) 02/13/2012   Overview:   12/2004 - Anginal equivalent symptoms:  Left arm pain, weakness, and shortness of breath.   12/2004:  Anterior STEMI.  Transferred to [facility].   01/06/2005 - Cardiac cath:  EF 51%, an occluded mid LAD and a large OM2 with 75% ostial stenosis.  PCI of the mid occluded LAD using a Cypher stent.   03/2005 - 2D echo:  EF >55%, LAE 4.7 cm, no significant valvular disease.   02/2008:  Plavix stopped.   06/2008:  Recurrent angina - symptoms of chest tightness.  Presented to [facility].  Cardiac catheterization.  The patient stated nonobstructive CAD seen.  Plavix restarted at that time.   03/2009 - 2D echo:  EF 35%, akinetic apex and septum.  Stress echocardiogram deferred for cardiac catheterization.   03/30/2009:  PCI 90% proximal LAD with a 3.0 x 18 mm Xience DES.   02/2010: Slow and gradual return of chest tightness, fatigue and generalized weakness concerning for return of anginal equivalent.  Stress test did not show ischemia to the heart muscle during exercise.  Evidenc   CHF (congestive heart failure) (HCC)    Chronic kidney disease    kidney stone left side   COPD (chronic obstructive pulmonary disease) (HCC) 07/03/2017   Dyslipidemia 02/13/2012   Overview:   03/2009  TC 158, TG 88, HDL 39, LDL 102.  11/2013  TC 148, TG 115, HDL 44.3, LDL 80.7 (done at PCP's office)   10/2014 TC 146  11/2015 TC 123, TG 86, HDL 41.7, LDL 64  11/2016 TC 140, TG 129, HDL 40.1, LDL 74    Dyspnea    GERD (gastroesophageal reflux disease)    GI disease 03/09/2013   Overview:   Dysphagia - EGD demonstrated esophageal stricture which was dilated   GERD   Hemorrhoids    Hearing loss 03/09/2013    History of kidney stones    Hyperlipidemia, unspecified 07/03/2017   Hypertension 02/13/2012   Myocardial infarction [facility]) 2004   Personal history of gout 12/09/2016   Primary osteoarthritis of both knees 05/05/2015   Right-sided low back pain without sciatica 04/14/2016   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   R ear - ED&C   Family History  Problem Relation Age of Onset   Alzheimer's disease Mother    Leukemia Father    Prostate cancer Neg Hx    Kidney cancer Neg Hx    Bladder Cancer Neg Hx    No Known Allergies Current Outpatient Medications  Medication Sig Dispense Refill Last Dose/Taking   acetaminophen  (TYLENOL ) 500 MG tablet Take 1,000 mg by mouth every 8 (eight) hours as needed for mild pain.      albuterol  (VENTOLIN  HFA) 108 (90 Base) MCG/ACT inhaler Inhale 1 puff into the lungs every 6 (six) hours as needed for wheezing or shortness of breath. 6.7 g 1    allopurinol  (ZYLOPRIM ) 300 MG tablet Take 300 mg by mouth daily.       [Paused] amLODipine  (NORVASC ) 5 MG tablet Take 1 tablet (5 mg total) by mouth daily. 30 tablet 1    apixaban  (ELIQUIS ) 5 MG TABS tablet Take 2 tablets (  10 mg total) by mouth 2 (two) times daily for 4 days, THEN 1 tablet (5 mg total) 2 (two) times daily for 26 days. 68 tablet 0    [Paused] aspirin  EC 81 MG tablet Take 81 mg by mouth daily.      budesonide -formoterol  (SYMBICORT ) 160-4.5 MCG/ACT inhaler Inhale 2 puffs into the lungs 2 (two) times daily. 1 each 1    colchicine 0.6 MG tablet Take 0.6 mg by mouth daily.      [Paused] furosemide  (LASIX ) 20 MG tablet Take 20 mg by mouth daily.      [Paused] lisinopril  (ZESTRIL ) 20 MG tablet Take 20 mg by mouth daily.      melatonin 10 MG TABS Take 5-10 mg by mouth at bedtime.      Multiple Vitamin (MULTIVITAMIN WITH MINERALS) TABS tablet Take 1 tablet by mouth daily.      nitroGLYCERIN  (NITROSTAT ) 0.4 MG SL tablet Place under the tongue.      ondansetron  (ZOFRAN -ODT) 4 MG disintegrating tablet Take 1 tablet (4  mg total) by mouth every 8 (eight) hours as needed for nausea or vomiting. 20 tablet 0    pantoprazole  (PROTONIX ) 40 MG tablet Take 1 tablet (40 mg total) by mouth daily. 30 tablet 1    rosuvastatin  (CRESTOR ) 20 MG tablet Take 20 mg by mouth daily.      VANISHPOINT TUBERCULIN SYRINGE 25G X 1" 1 ML MISC   5    vitamin B-12 (CYANOCOBALAMIN ) 1000 MCG tablet Take 1,000 mcg by mouth daily.      No current facility-administered medications for this encounter.   ROS  Review of Systems  Constitutional: Negative.   Cardiovascular: Negative.   Gastrointestinal:        RLQ ileostomy Some nausea  Skin:  Positive for wound.       Midline wound  Psychiatric/Behavioral: Negative.    All other systems reviewed and are negative.  Vital signs:  BP 131/82 (BP Location: Right Arm)   Pulse 75   Temp 98.1 F (36.7 C) (Oral)   Resp 17   SpO2 97%  Exam:  Physical Exam Vitals reviewed.  Constitutional:      Appearance: Normal appearance.  HENT:     Mouth/Throat:     Mouth: Mucous membranes are moist.  Cardiovascular:     Rate and Rhythm: Normal rate and regular rhythm.  Pulmonary:     Effort: Pulmonary effort is normal.     Breath sounds: Normal breath sounds.  Abdominal:     Palpations: Abdomen is soft.  Musculoskeletal:     Comments: Uses wheelchair in office today  Skin:    General: Skin is warm and dry.     Findings: Erythema present.  Neurological:     General: No focal deficit present.     Mental Status: He is alert and oriented to person, place, and time.  Psychiatric:        Mood and Affect: Mood normal.        Behavior: Behavior normal.     Stoma type/location: RLQ ileostomy,  1 3/8"  Stomal assessment/size:  pink moist only slightly budded Peristomal assessment:  erythema and weeping from frequent leaks Treatment options for stomal/peristomal skin: stoma powder and skin prep barrier ring adding convexity to promote seal Output: liquid brown stool Ostomy pouching: 1pc.  Convex with barrier ring  Education provided:  we perform pouch change. Wife providing most of care. Likes 1`piece convex will update [facility]  currently has HH  Impression/dx  Ileostomy Irritant contact dermatitis Discussion  Update orders with [facility] Add ostomy belt and convex pouch  Plan  Update orders with [facility]     Visit time: 55 minutes.   Branda Cain FNP-BC

## 2024-05-17 NOTE — Discharge Instructions (Signed)
 1 piece convex pouch  ITEM # [id number] Stoma powder Skin prep  Barrier ring Ostomy belt

## 2024-05-21 ENCOUNTER — Other Ambulatory Visit (HOSPITAL_COMMUNITY): Payer: Self-pay | Admitting: Nurse Practitioner

## 2024-05-21 DIAGNOSIS — Z7189 Other specified counseling: Secondary | ICD-10-CM | POA: Insufficient documentation

## 2024-05-21 DIAGNOSIS — L24B3 Irritant contact dermatitis related to fecal or urinary stoma or fistula: Secondary | ICD-10-CM | POA: Insufficient documentation

## 2024-05-21 DIAGNOSIS — Z432 Encounter for attention to ileostomy: Secondary | ICD-10-CM | POA: Insufficient documentation

## 2024-05-31 ENCOUNTER — Ambulatory Visit (HOSPITAL_COMMUNITY): Admitting: Nurse Practitioner

## 2024-06-27 ENCOUNTER — Ambulatory Visit: Admitting: Urology

## 2024-06-27 VITALS — BP 123/67 | HR 79 | Ht 69.0 in | Wt 175.0 lb

## 2024-06-27 DIAGNOSIS — R972 Elevated prostate specific antigen [PSA]: Secondary | ICD-10-CM

## 2024-06-27 NOTE — Progress Notes (Signed)
 I, Maysun LITTIE Griffiths, acting as a scribe for Glendia JAYSON Barba, MD., have documented all relevant documentation on the behalf of Glendia JAYSON Barba, MD, as directed by Glendia JAYSON Barba, MD while in the presence of Glendia JAYSON Barba, MD.  06/27/2024 4:33 PM   Fairy [name] Cretella 1941-06-19 [id number]  Referring provider: Sadie Manna, MD [address] [facility] [place],  [place] [address]  Chief Complaint  Patient presents with   Elevated PSA    HPI: Todd Rojas is a 83 y.o. male referred for evaluation of an elevated PSA. He presents today with his wife.   Hospitalized from 03/09/24 to 04/09/24 with severe sepsis and a heart attack. Underwent exploratory laparotomy with ileostomy and decompression of cecum. During that hospitalization and a subsequent hospitalization in April 2025, an area of increased attenuation was noted within the right peripheral zone of the prostate, which was not present on initial scans. PSA was checked by Dr. Sadie and was 4.06 on 05/23/24. Baseline PSA has varied since 2019, from the upper two-range to the upper three-range. Mild-moderate lower urinary tract symptoms of urinary hesitancy and decreased force and caliber of urinary stream. Prior history of stone disease. Recent urinalysis in early June 2025 was negative.   PMH: Past Medical History:  Diagnosis Date   Actinic keratosis    Arthritis    Asthma    Basal cell carcinoma 03/17/2020   right ant lat deltoid   CAD (coronary artery disease) 02/13/2012   Overview:   12/2004 - Anginal equivalent symptoms:  Left arm pain, weakness, and shortness of breath.   12/2004:  Anterior STEMI.  Transferred to [facility].   01/06/2005 - Cardiac cath:  EF 51%, an occluded mid LAD and a large OM2 with 75% ostial stenosis.  PCI of the mid occluded LAD using a Cypher stent.   03/2005 - 2D echo:  EF >55%, LAE 4.7 cm, no significant valvular disease.   02/2008:  Plavix stopped.   06/2008:  Recurrent angina  - symptoms of chest tightness.  Presented to [facility].  Cardiac catheterization.  The patient stated nonobstructive CAD seen.  Plavix restarted at that time.   03/2009 - 2D echo:  EF 35%, akinetic apex and septum.  Stress echocardiogram deferred for cardiac catheterization.   03/30/2009:  PCI 90% proximal LAD with a 3.0 x 18 mm Xience DES.   02/2010: Slow and gradual return of chest tightness, fatigue and generalized weakness concerning for return of anginal equivalent.  Stress test did not show ischemia to the heart muscle during exercise.  Evidenc   CHF (congestive heart failure) (HCC)    Chronic kidney disease    kidney stone left side   COPD (chronic obstructive pulmonary disease) (HCC) 07/03/2017   Dyslipidemia 02/13/2012   Overview:   03/2009  TC 158, TG 88, HDL 39, LDL 102.  11/2013  TC 148, TG 115, HDL 44.3, LDL 80.7 (done at PCP's office)   10/2014 TC 146  11/2015 TC 123, TG 86, HDL 41.7, LDL 64  11/2016 TC 140, TG 129, HDL 40.1, LDL 74    Dyspnea    GERD (gastroesophageal reflux disease)    GI disease 03/09/2013   Overview:   Dysphagia - EGD demonstrated esophageal stricture which was dilated   GERD   Hemorrhoids    Hearing loss 03/09/2013   History of kidney stones    Hyperlipidemia, unspecified 07/03/2017   Hypertension 02/13/2012   Myocardial infarction [facility]) 2004  Personal history of gout 12/09/2016   Primary osteoarthritis of both knees 05/05/2015   Right-sided low back pain without sciatica 04/14/2016   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   R ear - ED&C    Surgical History: Past Surgical History:  Procedure Laterality Date   colonoscopy with polypectomy     COLONOSCOPY WITH PROPOFOL  N/A 10/08/2018   Procedure: COLONOSCOPY WITH PROPOFOL ;  Surgeon: Viktoria Lamar DASEN, MD;  Location: [facility] ENDOSCOPY;  Service: Endoscopy;  Laterality: N/A;   COLONOSCOPY WITH PROPOFOL  N/A 01/06/2023   Procedure: COLONOSCOPY WITH PROPOFOL ;  Surgeon: Maryruth Ole DASEN, MD;  Location: [facility] ENDOSCOPY;  Service: Endoscopy;  Laterality: N/A;   coronary stents     CYSTOSCOPY WITH STENT PLACEMENT Left 08/09/2017   Procedure: CYSTOSCOPY WITH STENT PLACEMENT;  Surgeon: Chauncey Redell Agent, MD;  Location: [facility] ORS;  Service: Urology;  Laterality: Left;   FLEXIBLE SIGMOIDOSCOPY N/A 03/17/2024   Procedure: SIGMOIDOSCOPY, FLEXIBLE;  Surgeon: Toledo, Ladell POUR, MD;  Location: [facility] ENDOSCOPY;  Service: Gastroenterology;  Laterality: N/A;   MEDIAL PARTIAL KNEE REPLACEMENT Bilateral 2016   URETEROSCOPY WITH HOLMIUM LASER LITHOTRIPSY Left 08/09/2017   Procedure: URETEROSCOPY WITH HOLMIUM LASER LITHOTRIPSY/INCISION OF URETEROCELE;  Surgeon: Chauncey Redell Agent, MD;  Location: [facility] ORS;  Service: Urology;  Laterality: Left;   XI ROBOT ASSISTED DIAGNOSTIC LAPAROSCOPY N/A 03/20/2024   Procedure: LAPAROSCOPY, DIAGNOSTIC, ROBOT-ASSISTED;  Surgeon: Jordis Laneta FALCON, MD;  Location: [facility] ORS;  Service: General;  Laterality: N/A;    Home Medications:  Allergies as of 06/27/2024   No Known Allergies      Medication List        Accurate as of June 27, 2024  4:33 PM. If you have any questions, ask your nurse or doctor.          PAUSE taking these medications    furosemide  20 MG tablet Wait to take this until your doctor or other care provider tells you to start again. Commonly known as: LASIX  Take 20 mg by mouth daily.   lisinopril  20 MG tablet Wait to take this until your doctor or other care provider tells you to start again. Commonly known as: ZESTRIL  Take 20 mg by mouth daily.       STOP taking these medications    amLODipine  5 MG tablet Wait to take this until your doctor or other care provider tells you to start again. Commonly known as: NORVASC  Stopped by: Glendia JAYSON Barba   aspirin  EC 81 MG tablet Wait to take this until your doctor or other care provider tells you to start again. Stopped by: Glendia JAYSON Barba   pantoprazole  40 MG tablet Commonly  known as: PROTONIX  Stopped by: Glendia JAYSON Barba       TAKE these medications    acetaminophen  500 MG tablet Commonly known as: TYLENOL  Take 1,000 mg by mouth every 8 (eight) hours as needed for mild pain.   albuterol  108 (90 Base) MCG/ACT inhaler Commonly known as: VENTOLIN  HFA Inhale 1 puff into the lungs every 6 (six) hours as needed for wheezing or shortness of breath.   allopurinol  300 MG tablet Commonly known as: ZYLOPRIM  Take 300 mg by mouth daily.   budesonide -formoterol  160-4.5 MCG/ACT inhaler Commonly known as: SYMBICORT  Inhale 2 puffs into the lungs 2 (two) times daily.   colchicine 0.6 MG tablet Take 0.6 mg by mouth daily.   cyanocobalamin  1000 MCG tablet Commonly known as: VITAMIN B12 Take 1,000 mcg by mouth daily.   Eliquis  5 MG Tabs  tablet Generic drug: apixaban  Take 2 tablets (10 mg total) by mouth 2 (two) times daily for 4 days, THEN 1 tablet (5 mg total) 2 (two) times daily for 26 days. Start taking on: Apr 19, 2024   Melatonin 10 MG Tabs Take 5-10 mg by mouth at bedtime.   multivitamin with minerals Tabs tablet Take 1 tablet by mouth daily.   nitroGLYCERIN  0.4 MG SL tablet Commonly known as: NITROSTAT  Place under the tongue.   ondansetron  4 MG disintegrating tablet Commonly known as: ZOFRAN -ODT Take 1 tablet (4 mg total) by mouth every 8 (eight) hours as needed for nausea or vomiting.   rosuvastatin  20 MG tablet Commonly known as: CRESTOR  Take 20 mg by mouth daily.   VanishPoint Tuberculin Syringe 25G X 1 1 ML Misc Generic drug: Tuberculin-Allergy Syringes        Allergies: No Known Allergies  Family History: Family History  Problem Relation Age of Onset   Alzheimer's disease Mother    Leukemia Father    Prostate cancer Neg Hx    Kidney cancer Neg Hx    Bladder Cancer Neg Hx     Social History:  reports that he has quit smoking. He quit smokeless tobacco use about 29 years ago.  His smokeless tobacco use included chew. He  reports that he does not drink alcohol  and does not use drugs.   Physical Exam: BP 123/67   Pulse 79   Ht 5' 9 (1.753 m)   Wt 175 lb (79.4 kg)   BMI 25.84 kg/m   Constitutional:  Alert and oriented, No acute distress. HEENT: [place] AT Respiratory: Normal respiratory effort, no increased work of breathing. GU: Prostate 40 grams, slight asymmetry L>R, though consistency is normal and uniform throughout, without nodules or induration.  Skin: No rashes, bruises or suspicious lesions. Neurologic: Grossly intact, no focal deficits, moving all 4 extremities. Psychiatric: Normal mood and affect.  Assessment & Plan:    1. Abnormal pelvic CT Increased area of attenuation, which is a nonspecific finding and most likely represents inflammation.  We discuss the incidence of high-grade prostate cancer is low in men over age 89 and this would be less likely We discussed a prostate MRI could be performed to assess for high-grade lesions, and they have agreed to schedule.  PSA is not significantly elevated and normal by age-specific guidelines.  [facility] [address] [place], [place] [address] [phone]

## 2024-06-27 NOTE — Patient Instructions (Signed)
 Please contact Central Scheduling to set up your prostate MRI at [phone].  Prostate MRI Prep:  1- No ejaculation 48 hours prior to exam  2- No caffeine or carbonated beverages on day of the exam  3- Eat light diet evening prior and day of exam  4- Avoid eating 4 hours prior to exam  5- Fleets enema needs to be done 4 hours prior to exam -See below. Can be purchased at the drug store.

## 2024-06-29 ENCOUNTER — Encounter: Payer: Self-pay | Admitting: Urology

## 2024-07-03 ENCOUNTER — Ambulatory Visit: Admitting: Surgery

## 2024-07-03 ENCOUNTER — Encounter: Payer: Self-pay | Admitting: Surgery

## 2024-07-03 VITALS — BP 121/69 | HR 89 | Temp 97.7°F | Ht 69.0 in | Wt 176.2 lb

## 2024-07-03 DIAGNOSIS — Z432 Encounter for attention to ileostomy: Secondary | ICD-10-CM | POA: Diagnosis not present

## 2024-07-03 DIAGNOSIS — Z932 Ileostomy status: Secondary | ICD-10-CM

## 2024-07-03 MED ORDER — POLYETHYLENE GLYCOL 3350 17 GM/SCOOP PO POWD: ORAL | 0 refills | Status: DC

## 2024-07-03 MED ORDER — METRONIDAZOLE 500 MG PO TABS: ORAL_TABLET | ORAL | 0 refills | Status: DC

## 2024-07-03 MED ORDER — NEOMYCIN SULFATE 500 MG PO TABS: ORAL_TABLET | ORAL | 0 refills | Status: DC

## 2024-07-03 NOTE — Patient Instructions (Signed)
 We have scheduled you for a CT Scan of your Abdomen and Pelvis with contrast. This has been scheduled at [facility] on  07/12/2024. Please arrive there by 11 am . If you need to reschedule your Scan, you may do so by calling [phone]. Please let us  know if you reschedule your scan as we have to get authorization from your insurance for this.     Your Barium Enema is scheduled for 07/12/2024 at 10 am [facility].   Ileostomy, Care After  The following information offers guidance on how to care for yourself after your procedure. Your health care provider may also give you more specific instructions. If you have problems or questions, contact your health care provider. What can I expect after the procedure? After the procedure, it is common to have: A small amount of blood or clear fluid leaking from your stoma or rectum. Pain and discomfort in your abdomen, especially around your stoma. Your stoma may be dark-colored, swollen, and bruised at first. Irregular bowel movements for several days. Loose stool. Bloating. Follow these instructions at home: Medicines Take over-the-counter and prescription medicines only as told by your health care provider. If you were prescribed antibiotics, take them as told by your health care provider. Do not stop using the antibiotic even if you start to feel better. Ask your health care provider if the medicine prescribed to you requires you to avoid driving or using machinery. Stoma and incision care  Keep the skin around your stoma clean and dry. Follow instructions from your health care provider about how to take care of your stoma and incision. Make sure you: Wash your hands with soap and water for at least 20 seconds before and after you change your bandage (dressing). If soap and water are not available, use hand sanitizer. Change your dressing as told by your health care provider. Leave stitches (sutures), skin glue, or adhesive  strips in place. These skin closures may need to stay in place for 2 weeks or longer. If adhesive strip edges start to loosen and curl up, you may trim the loose edges. Do not remove adhesive strips completely unless your health care provider tells you to do that. Check your stoma area every day for signs of infection. Check for: More redness, swelling, or pain. More fluid or blood. Warmth. Pus or a bad smell. Follow your health care provider's instructions about changing and cleaning your ostomy pouch. Keep supplies with you at all times to care for your stoma and ostomy pouch. Also keep extra clothing with you. Eating and drinking Follow instructions from your health care provider about what you may eat and drink. Advance your diet as told, and do not eat foods that may cause obstruction. Pay attention to which foods and drinks cause problems with digestion, such as gas, constipation, or diarrhea. Avoid spicy foods and caffeine while your stoma heals. Eat meals and snacks at regular intervals. Drink enough fluid to keep your urine pale yellow. This helps prevent dehydration. Activity  Return to your normal activities as told by your health care provider. Ask your health care provider what activities are safe for you. Rest as much as possible while your stoma heals. Avoid intense physical activity for as long as you are told by your health care provider. You may have to avoid lifting. Ask your health care provider how much you can safely lift. General instructions Wear compression stockings as told by your health care provider. These stockings  help to prevent blood clots and reduce swelling in your legs. Do not take baths, swim, or use a hot tub until your health care provider approves. Ask your health care provider if you may take showers. You may only be allowed to take sponge baths. Do not use any products that contain nicotine or tobacco. These products include cigarettes, chewing  tobacco, and vaping devices, such as e-cigarettes. These can delay incision healing after surgery. If you need help quitting, ask your health care provider. Talk with your health care provider if you plan to become pregnant or if you take birth control pills. Tell your health care provider if you feel depressed or anxious. This surgery is a major change, and counseling may help you. Consider joining an ostomy support group. Keep all follow-up visits. Your health care provider will check how you are healing and watch for problems such as dehydration. Contact a health care provider if: You have a fever or chills. You have bowel or abdomen problems, such as: Loose stools that do not thicken after several weeks. Having bowel movements more often or less often than you were told to expect. Feeling nauseous. Vomiting. Pain, bloating, pressure, or cramping in the abdomen. You have stoma problems such as: Burning or itchy skin around your stoma. Any signs of infection in the stoma or incision area. Changes to the stoma, such as bleeding, wounds, dead tissue, or a dark or dusky color. You have problems with sexual activity. You have an unusual lack of energy (fatigue). You have signs of dehydration. These include being unusually thirsty, always having a dry mouth, having dark-colored urine, or mental confusion. You have pain or cramps in your abdomen that get worse or do not go away with medicine. You feel dizzy or light-headed. You have internal tissue coming out of your stoma (prolapse). Get help right away if: Your stoma changes size or color all of a sudden. You have bleeding from your stoma that does not stop. You faint. You have shortness of breath. You have chest pain or an irregular heartbeat. These symptoms may be an emergency. Get help right away. Call 911. Do not wait to see if the symptoms will go away. Do not drive yourself to the hospital. This information is not intended to  replace advice given to you by your health care provider. Make sure you discuss any questions you have with your health care provider. Document Revised: 06/08/2022 Document Reviewed: 06/08/2022 Elsevier Patient Education  2024 [employer].

## 2024-07-04 ENCOUNTER — Encounter: Payer: Self-pay | Admitting: Surgery

## 2024-07-04 NOTE — Progress Notes (Signed)
 Outpatient Surgical Follow Up   Todd Rojas is an 83 y.o. male.   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  It have hospitalization for failure to thrive and some pneumonia.  Obviously this illness has taken a toll but his spinal lesions starting to recover.  He is eating his ostomy is working well.  No fevers no chills.  Past Medical History:  Diagnosis Date   Actinic keratosis    Arthritis    Asthma    Basal cell carcinoma 03/17/2020   right ant lat deltoid   CAD (coronary artery disease) 02/13/2012   Overview:   12/2004 - Anginal equivalent symptoms:  Left arm pain, weakness, and shortness of breath.   12/2004:  Anterior STEMI.  Transferred to [facility].   01/06/2005 - Cardiac cath:  EF 51%, an occluded mid LAD and a large OM2 with 75% ostial stenosis.  PCI of the mid occluded LAD using a Cypher stent.   03/2005 - 2D echo:  EF >55%, LAE 4.7 cm, no significant valvular disease.   02/2008:  Plavix stopped.   06/2008:  Recurrent angina - symptoms of chest tightness.  Presented to [facility].  Cardiac catheterization.  The patient stated nonobstructive CAD seen.  Plavix restarted at that time.   03/2009 - 2D echo:  EF 35%, akinetic apex and septum.  Stress echocardiogram deferred for cardiac catheterization.   03/30/2009:  PCI 90% proximal LAD with a 3.0 x 18 mm Xience DES.   02/2010: Slow and gradual return of chest tightness, fatigue and generalized weakness concerning for return of anginal equivalent.  Stress test did not show ischemia to the heart muscle during exercise.  Evidenc   CHF (congestive heart failure) (HCC)    Chronic kidney disease    kidney stone left side   COPD (chronic obstructive pulmonary disease) (HCC) 07/03/2017   Dyslipidemia 02/13/2012   Overview:   03/2009   TC 158, TG 88, HDL 39, LDL 102.  11/2013  TC 148, TG 115, HDL 44.3, LDL 80.7 (done at PCP's office)   10/2014 TC 146  11/2015 TC 123, TG 86, HDL 41.7, LDL 64  11/2016 TC 140, TG 129, HDL 40.1, LDL 74    Dyspnea    GERD (gastroesophageal reflux disease)    GI disease 03/09/2013   Overview:   Dysphagia - EGD demonstrated esophageal stricture which was dilated   GERD   Hemorrhoids    Hearing loss 03/09/2013   History of kidney stones    Hyperlipidemia, unspecified 07/03/2017   Hypertension 02/13/2012   Myocardial infarction [facility]) 2004   Personal history of gout 12/09/2016   Primary osteoarthritis of both knees 05/05/2015   Right-sided low back pain without sciatica 04/14/2016   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   R ear - ED&C    Past Surgical History:  Procedure Laterality Date   colonoscopy with polypectomy     COLONOSCOPY WITH PROPOFOL  N/A 10/08/2018   Procedure: COLONOSCOPY WITH PROPOFOL ;  Surgeon: Viktoria Lamar DASEN, MD;  Location: [facility] ENDOSCOPY;  Service: Endoscopy;  Laterality: N/A;   COLONOSCOPY WITH PROPOFOL  N/A 01/06/2023   Procedure: COLONOSCOPY WITH PROPOFOL ;  Surgeon: Maryruth Ole DASEN, MD;  Location: [facility] ENDOSCOPY;  Service: Endoscopy;  Laterality: N/A;   coronary stents     CYSTOSCOPY  WITH STENT PLACEMENT Left 08/09/2017   Procedure: CYSTOSCOPY WITH STENT PLACEMENT;  Surgeon: Chauncey Redell Agent, MD;  Location: [facility] ORS;  Service: Urology;  Laterality: Left;   FLEXIBLE SIGMOIDOSCOPY N/A 03/17/2024   Procedure: SIGMOIDOSCOPY, FLEXIBLE;  Surgeon: Toledo, Ladell POUR, MD;  Location: [facility] ENDOSCOPY;  Service: Gastroenterology;  Laterality: N/A;   MEDIAL PARTIAL KNEE REPLACEMENT Bilateral 2016   URETEROSCOPY WITH HOLMIUM LASER LITHOTRIPSY Left 08/09/2017   Procedure: URETEROSCOPY WITH HOLMIUM LASER LITHOTRIPSY/INCISION OF URETEROCELE;  Surgeon: Chauncey Redell Agent, MD;  Location: [facility] ORS;  Service: Urology;  Laterality: Left;   XI ROBOT ASSISTED DIAGNOSTIC LAPAROSCOPY  N/A 03/20/2024   Procedure: LAPAROSCOPY, DIAGNOSTIC, ROBOT-ASSISTED;  Surgeon: Jordis Laneta FALCON, MD;  Location: [facility] ORS;  Service: General;  Laterality: N/A;    Family History  Problem Relation Age of Onset   Alzheimer's disease Mother    Leukemia Father    Prostate cancer Neg Hx    Kidney cancer Neg Hx    Bladder Cancer Neg Hx     Social History:  reports that he has quit smoking. He quit smokeless tobacco use about 29 years ago.  His smokeless tobacco use included chew. He reports that he does not drink alcohol  and does not use drugs.  Allergies: No Known Allergies  Medications reviewed.    ROS Full ROS performed and is otherwise negative other than what is stated in HPI   BP 121/69   Pulse 89   Temp 97.7 F (36.5 C) (Oral)   Ht 5' 9 (1.753 m)   Wt 176 lb 3.2 oz (79.9 kg)   SpO2 96%   BMI 26.02 kg/m   Physical Exam Vitals and nursing note reviewed. Exam conducted with a chaperone present.  Constitutional:      General: He is not in acute distress.    Appearance: Normal appearance. He is not ill-appearing or diaphoretic.  Cardiovascular:     Rate and Rhythm: Normal rate and regular rhythm.     Heart sounds: No murmur heard.    No friction rub.  Pulmonary:     Effort: Pulmonary effort is normal. No respiratory distress.     Breath sounds: Normal breath sounds. No stridor. No wheezing or rhonchi.  Abdominal:     General: Bowel sounds are normal. There is no distension.     Palpations: Abdomen is soft. There is no mass.     Tenderness: There is no abdominal tenderness. There is no guarding.     Hernia: No hernia is present.     Comments: Ileostomy in place, small parastomal w small parastomal   Musculoskeletal:        General: No swelling or tenderness. Normal range of motion.     Cervical back: Normal range of motion and neck supple. No rigidity or tenderness.  Lymphadenopathy:     Cervical: No cervical adenopathy.  Skin:    General: Skin is warm and dry.      Capillary Refill: Capillary refill takes less than 2 seconds.     Coloration: Skin is not jaundiced.  Neurological:     General: No focal deficit present.     Mental Status: He is alert.  Psychiatric:        Mood and Affect: Mood normal.        Behavior: Behavior normal.        Thought Content: Thought content normal.        Judgment: Judgment normal.    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  He is now recovered and doing well.  He is interested in ostomy reversal.  Discussed with him and the wife in detail.  I am not in a rash to do this.  I do think that we need to wait until he is as optimized as possible.  I will also start with further workup including a barium enema and a CT scan of the abdomen and pelvis.  We may have to do a parastomal hernia as well.  I will wait at least a couple more months.  They understand I personally spent a total of 40 minutes in the care of the patient today including performing a medically appropriate exam/evaluation, counseling and educating, placing orders, referring and communicating with other health care professionals, documenting clinical information in the EHR, independently interpreting and reviewing images studies and coordinating care.   Laneta Luna, MD [facility] General Surgeon

## 2024-07-05 ENCOUNTER — Ambulatory Visit: Admission: RE | Admit: 2024-07-05 | Source: Ambulatory Visit

## 2024-07-05 ENCOUNTER — Emergency Department
Admission: EM | Admit: 2024-07-05 | Discharge: 2024-07-05 | Disposition: A | Discharge status: Home / Self Care | Attending: Emergency Medicine | Admitting: Emergency Medicine

## 2024-07-05 ENCOUNTER — Emergency Department

## 2024-07-05 ENCOUNTER — Encounter: Payer: Self-pay | Admitting: Emergency Medicine

## 2024-07-05 ENCOUNTER — Other Ambulatory Visit: Payer: Self-pay

## 2024-07-05 DIAGNOSIS — R197 Diarrhea, unspecified: Secondary | ICD-10-CM | POA: Diagnosis present

## 2024-07-05 DIAGNOSIS — I110 Hypertensive heart disease with heart failure: Secondary | ICD-10-CM | POA: Diagnosis not present

## 2024-07-05 DIAGNOSIS — I2510 Atherosclerotic heart disease of native coronary artery without angina pectoris: Secondary | ICD-10-CM | POA: Diagnosis not present

## 2024-07-05 DIAGNOSIS — J449 Chronic obstructive pulmonary disease, unspecified: Secondary | ICD-10-CM | POA: Diagnosis not present

## 2024-07-05 DIAGNOSIS — K529 Noninfective gastroenteritis and colitis, unspecified: Secondary | ICD-10-CM | POA: Diagnosis not present

## 2024-07-05 DIAGNOSIS — I509 Heart failure, unspecified: Secondary | ICD-10-CM | POA: Insufficient documentation

## 2024-07-05 LAB — URINALYSIS, ROUTINE W REFLEX MICROSCOPIC
Bilirubin Urine: NEGATIVE
Glucose, UA: NEGATIVE mg/dL
Hgb urine dipstick: NEGATIVE
Ketones, ur: NEGATIVE mg/dL
Leukocytes,Ua: NEGATIVE
Nitrite: NEGATIVE
Protein, ur: NEGATIVE mg/dL
Specific Gravity, Urine: >1.046 — ABNORMAL HIGH (ref 1.005–1.030)
pH: 5 (ref 5.0–8.0)

## 2024-07-05 LAB — CBC
HCT: 45.5 % (ref 39.0–52.0)
Hemoglobin: 14.9 g/dL (ref 13.0–17.0)
MCH: 32.4 pg (ref 26.0–34.0)
MCHC: 32.7 g/dL (ref 30.0–36.0)
MCV: 98.9 fL (ref 80.0–100.0)
Platelets: 252 K/uL (ref 150–400)
RBC: 4.6 MIL/uL (ref 4.22–5.81)
RDW: 15.3 % (ref 11.5–15.5)
WBC: 12.9 K/uL — ABNORMAL HIGH (ref 4.0–10.5)
nRBC: 0 % (ref 0.0–0.2)

## 2024-07-05 LAB — COMPREHENSIVE METABOLIC PANEL WITH GFR
ALT: 28 U/L (ref 0–44)
AST: 27 U/L (ref 15–41)
Albumin: 4.3 g/dL (ref 3.5–5.0)
Alkaline Phosphatase: 55 U/L (ref 38–126)
Anion gap: 12 (ref 5–15)
BUN: 30 mg/dL — ABNORMAL HIGH (ref 8–23)
CO2: 17 mmol/L — ABNORMAL LOW (ref 22–32)
Calcium: 10.4 mg/dL — ABNORMAL HIGH (ref 8.9–10.3)
Chloride: 109 mmol/L (ref 98–111)
Creatinine, Ser: 1.59 mg/dL — ABNORMAL HIGH (ref 0.61–1.24)
GFR, Estimated: 43 mL/min — ABNORMAL LOW (ref >60)
Glucose, Bld: 169 mg/dL — ABNORMAL HIGH (ref 70–99)
Potassium: 4.9 mmol/L (ref 3.5–5.1)
Sodium: 138 mmol/L (ref 135–145)
Total Bilirubin: 1 mg/dL (ref 0.0–1.2)
Total Protein: 8 g/dL (ref 6.5–8.1)

## 2024-07-05 LAB — LIPASE, BLOOD: Lipase: 48 U/L (ref 11–51)

## 2024-07-05 MED ORDER — SODIUM CHLORIDE 0.9 % IV BOLUS
1000.0000 mL | Freq: Once | INTRAVENOUS | Status: AC
  Administered 2024-07-05: 1000 mL via INTRAVENOUS

## 2024-07-05 MED ORDER — ONDANSETRON HCL 4 MG/2ML IJ SOLN
4.0000 mg | Freq: Once | INTRAMUSCULAR | Status: AC
  Administered 2024-07-05: 4 mg via INTRAVENOUS
  Filled 2024-07-05: qty 2

## 2024-07-05 MED ORDER — IOHEXOL 300 MG/ML  SOLN
100.0000 mL | Freq: Once | INTRAMUSCULAR | Status: AC | PRN
Start: 2024-07-05 — End: 2024-07-05
  Administered 2024-07-05: 100 mL via INTRAVENOUS

## 2024-07-05 MED ORDER — ONDANSETRON 4 MG PO TBDP: 4.0000 mg | ORAL_TABLET | Freq: Three times a day (TID) | ORAL | 0 refills | Status: DC | PRN

## 2024-07-05 NOTE — Discharge Instructions (Addendum)
 Please take your nausea medication as needed, as written.  Please drink plenty of fluids follow-up with your doctor in the next several days for recheck/reevaluation.

## 2024-07-05 NOTE — ED Triage Notes (Signed)
 Patient to ED via POV for vomiting since 3am. Wife reports pt has a colostomy and bag has filled up 4-5 times since 3 am. PT also having stool come from rectum. Wife gave pt zofran  earlier this AM.

## 2024-07-05 NOTE — ED Provider Notes (Signed)
 [facility] Provider Note    Event Date/Time   First MD Initiated Contact with Patient 07/05/24 [phone]     (approximate)  History   Chief Complaint: Emesis  HPI  Todd Rojas is a 83 y.o. male with a past medical history of CAD, CHF, COPD, hypertension, presents to the emergency department for nausea vomiting diarrhea and abdominal cramping.  According to the patient since last night he has been nauseated with frequent episodes of vomiting throughout the night.  States abdominal cramping and he has been experiencing loose stool in his ostomy bag.  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  Patient denies any issues with the ostomy until today when he is putting out significant amount of output.  No fever.  Physical Exam   Triage Vital Signs: ED Triage Vitals [07/05/24 0743]  Encounter Vitals Group     BP 117/76     Girls Systolic BP Percentile      Girls Diastolic BP Percentile      Boys Systolic BP Percentile      Boys Diastolic BP Percentile      Pulse Rate (!) 101     Resp 17     Temp 97.6 F (36.4 C)     Temp Source Oral     SpO2 97 %     Weight 174 lb (78.9 kg)     Height 5' 9 (1.753 m)     Head Circumference      Peak Flow      Pain Score 6     Pain Loc      Pain Education      Exclude from Growth Chart     Most recent vital signs: Vitals:   07/05/24 0743  BP: 117/76  Pulse: (!) 101  Resp: 17  Temp: 97.6 F (36.4 C)  SpO2: 97%    General: Awake, no distress.  CV:  Good peripheral perfusion.  Regular rate and rhythm  Resp:  Normal effort.  Equal breath sounds bilaterally.  Abd:  No distention.  Soft, benign abdomen.  Ostomy present with mild output.  ED Results / Procedures / Treatments   RADIOLOGY  I have reviewed and interpreted the CT images.  No obvious obstruction seen on my evaluation. Radiology is read the CT scan is essentially negative for acute  abnormality.   MEDICATIONS ORDERED IN ED: Medications  sodium chloride  0.9 % bolus 1,000 mL (has no administration in time range)  ondansetron  (ZOFRAN ) injection 4 mg (has no administration in time range)     IMPRESSION / MDM / ASSESSMENT AND PLAN / ED COURSE  I reviewed the triage vital signs and the nursing notes.  Patient's presentation is most consistent with acute presentation with potential threat to life or bodily function.  Patient presents to the emergency department for nausea vomiting and loose ostomy output throughout the night last night.  Largely benign abdomen with mild ostomy output currently.  Patient states abdominal cramping.  Given the patient's symptoms age and recent ostomy we will obtain CT imaging the abdomen and pelvis to further evaluate.  We will check labs, IV hydrate treat nausea and continue to closely monitor.  Differential would include gastroenteritis, partial obstruction, ileus.  Patient states he is having loose stool from the rectum as well which is the first time he has had stool from the rectum since his ostomy was placed.  This could indicate gastroenteritis given the patient  still has an intact colon.  CT scan is negative for acute abnormality.  Lab work is reassuring with a normal CBC chemistry and urinalysis.  Patient has tolerated p.o. trial without issue.  Patient states he is feeling much better.  Has received IV fluids.  Will discharge the patient home with Zofran .  Patient will return if symptoms worsen otherwise will follow-up with his doctor.  Patient agreeable to plan.  FINAL CLINICAL IMPRESSION(S) / ED DIAGNOSES   Gastroenteritis   Note:  This document was prepared using Dragon voice recognition software and may include unintentional dictation errors.   Dorothyann Drivers, MD 07/05/24 [phone]

## 2024-07-09 ENCOUNTER — Ambulatory Visit
Admission: RE | Admit: 2024-07-09 | Discharge: 2024-07-09 | Disposition: A | Discharge status: Home / Self Care | Source: Ambulatory Visit | Attending: Urology | Admitting: Urology

## 2024-07-09 DIAGNOSIS — R972 Elevated prostate specific antigen [PSA]: Secondary | ICD-10-CM | POA: Diagnosis present

## 2024-07-09 DIAGNOSIS — N4289 Other specified disorders of prostate: Secondary | ICD-10-CM

## 2024-07-09 HISTORY — DX: Other specified disorders of prostate: N42.89

## 2024-07-09 MED ORDER — GADOBUTROL 1 MMOL/ML IV SOLN
7.0000 mL | Freq: Once | INTRAVENOUS | Status: AC | PRN
  Administered 2024-07-09: 7 mL via INTRAVENOUS

## 2024-07-12 ENCOUNTER — Ambulatory Visit: Admission: RE | Admit: 2024-07-12 | Source: Ambulatory Visit

## 2024-07-12 ENCOUNTER — Ambulatory Visit
Admission: RE | Admit: 2024-07-12 | Discharge: 2024-07-12 | Disposition: A | Discharge status: Home / Self Care | Source: Ambulatory Visit | Attending: Surgery | Admitting: Surgery

## 2024-07-12 DIAGNOSIS — N400 Enlarged prostate without lower urinary tract symptoms: Secondary | ICD-10-CM | POA: Diagnosis not present

## 2024-07-12 DIAGNOSIS — Z932 Ileostomy status: Secondary | ICD-10-CM | POA: Insufficient documentation

## 2024-07-12 LAB — POCT I-STAT CREATININE: Creatinine, Ser: 1.7 mg/dL — ABNORMAL HIGH (ref 0.61–1.24)

## 2024-07-12 MED ORDER — IOHEXOL 300 MG/ML  SOLN
100.0000 mL | Freq: Once | INTRAMUSCULAR | Status: AC | PRN
  Administered 2024-07-12: 80 mL via INTRAVENOUS

## 2024-07-13 ENCOUNTER — Ambulatory Visit: Payer: Self-pay | Admitting: Urology

## 2024-07-16 ENCOUNTER — Ambulatory Visit
Admission: RE | Admit: 2024-07-16 | Discharge: 2024-07-16 | Disposition: A | Discharge status: Home / Self Care | Source: Ambulatory Visit | Attending: Surgery | Admitting: Surgery

## 2024-07-16 DIAGNOSIS — Z932 Ileostomy status: Secondary | ICD-10-CM | POA: Insufficient documentation

## 2024-07-16 MED ORDER — IOHEXOL 300 MG/ML  SOLN
150.0000 mL | Freq: Once | INTRAMUSCULAR | Status: AC | PRN
  Administered 2024-07-16: 150 mL

## 2024-07-17 NOTE — Telephone Encounter (Signed)
 This encounter was created in error - please disregard.

## 2024-08-05 ENCOUNTER — Ambulatory Visit (INDEPENDENT_AMBULATORY_CARE_PROVIDER_SITE_OTHER): Admitting: Surgery

## 2024-08-05 ENCOUNTER — Encounter: Payer: Self-pay | Admitting: Surgery

## 2024-08-05 VITALS — BP 137/70 | HR 71 | Temp 98.0°F | Ht 69.0 in | Wt 181.0 lb

## 2024-08-05 DIAGNOSIS — Z932 Ileostomy status: Secondary | ICD-10-CM

## 2024-08-05 DIAGNOSIS — Z432 Encounter for attention to ileostomy: Secondary | ICD-10-CM

## 2024-08-05 NOTE — Progress Notes (Signed)
 Patient has been advised of Pre-Admission date/time, and Surgery date at [facility].  Surgery Date: 08/09/24 Preadmission Testing Date: 08/07/24 (phone 1P-4P)  Patient has been made aware to call [phone], between 1-3:00pm the day before surgery, to find out what time to arrive for surgery.

## 2024-08-05 NOTE — Patient Instructions (Signed)
 Continue to stay off the Elquis for this surgery   We have spoken today about reversing your Ostomy. We will schedule this with Dr. Jordis at [facility].   If you are on any injectable weight loss medication, you will need to stop taking your GLP-1 injectable (weight loss) medications 8 days before your surgery to avoid any complications with anesthesia.   Plan on being in the hospital between 5-7 days after surgery. You will be started on a liquid diet and then advanced as tolerated prior to going home.  If you have any disability or FMLA paperwork that needs to be filled out for your employer, please bring this in prior to surgery or you may fax it to [phone].   No Colon Prep needed  Please see your [facility]) Pre-Care Sheet for more information. Our surgery scheduler will call you to review surgery information and let you know about your surgery date. If you have any questions, please call our office and ask for a nurse.  End Colostomy Reversal An end colostomy reversal is surgery that reverses an end colostomy. The large intestine is disconnected from the opening in the abdomen (stoma). Then it is reconnected to the large intestine inside the body. A stoma and pouch are no longer needed. Bowel movements can resume through the rectum. LET [facility] CARE PROVIDER KNOW ABOUT: Allergies to food or medicine. Medicines taken, including vitamins, health supplements, herbs, eye drops, over-the-counter medicines, and creams. Use of steroids (by mouth or creams). Previous problems with anesthetics or numbing medicines. History of bleeding problems or blood clots. Previous surgery. Other health problems, including diabetes and kidney problems. Possibility of pregnancy, if this applies. RISKS AND COMPLICATIONS General surgical complications may include the following: Reaction to anesthetics. Damage to surrounding nerves, tissues, or structures. Blood clot. Bleeding. Scarring. Specific  risks for colostomy reversal, while rare, may include: Intestinal paralysis (ileus). This is a normal part of recovery. It usually goes away in 3-7 days. However, it can last longer in some people. Leaking at the joined part of the intestine (anastomotic leak). Infection of the surgical cut (incision) or the place where the stoma was located. A collection of pus (abscess) in the abdomen or pelvis. Intestinal blockage. Narrowing at the joined part of the intestine (stricture). Urinary and sexual dysfunction. BEFORE THE PROCEDURE It is important to follow your health care provider's instructions prior to your procedure. This will help you to avoid complications. Steps before your procedure may include: A physical exam, rectal exam, X-rays, colonoscopy, and other procedures. Chemotherapy or radiation therapy, if the stoma was created due to cancer. A review of the procedure, the anesthetic being used, and what to expect after the procedure. You may be asked to: Stop taking certain medicines for several days prior to your procedure. These may include blood thinners (such as aspirin ). Take certain medicines, such as antibiotics or stool softeners. Avoid eating and drinking after midnight the night before the procedure. This will help you to avoid complications from the anesthetic. Quit smoking. Smoking increases the chances of a healing problem after your procedure. PROCEDURE You will be given medicine that makes you sleep (general anesthetic). The procedure may be done as open surgery, with a large incision. It may also be done as laparoscopic surgery, with several smaller incisions. The surgeon will stitch or staple the intestine ends back together. This surgery takes several hours. AFTER THE PROCEDURE You will be given pain medicine. Slowly increase your diet and movement as directed  by your health care provider. You should arrange for someone to help you with activities at home while you  recover.   This information is not intended to replace advice given to you by your health care provider. Make sure you discuss any questions you have with your health care provider.   Document Released: 02/27/2012 Document Revised: 04/21/2015 Document Reviewed: 02/27/2012 Elsevier Interactive Patient Education [employer]

## 2024-08-05 NOTE — Progress Notes (Signed)
 Outpatient Surgical Follow Up  08/05/2024  Todd Rojas is an 83 y.o. male.   Chief Complaint  Patient presents with   Follow-up    HPI:  Todd Rojas is an 83 year old male very well-known to me.  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  It have hospitalization for failure to thrive and some pneumonia.  He has recovered and has regained his strength. He has been using the tractor and mowing.SABRA  He is eating his ostomy is working well.  No fevers no chills.  Recent CT and BE pers reviewed showing no obstructing lesions, parastomal component.   Past Medical History:  Diagnosis Date   Actinic keratosis    Arthritis    Asthma    Basal cell carcinoma 03/17/2020   right ant lat deltoid   CAD (coronary artery disease) 02/13/2012   Overview:   12/2004 - Anginal equivalent symptoms:  Left arm pain, weakness, and shortness of breath.   12/2004:  Anterior STEMI.  Transferred to [facility].   01/06/2005 - Cardiac cath:  EF 51%, an occluded mid LAD and a large OM2 with 75% ostial stenosis.  PCI of the mid occluded LAD using a Cypher stent.   03/2005 - 2D echo:  EF >55%, LAE 4.7 cm, no significant valvular disease.   02/2008:  Plavix stopped.   06/2008:  Recurrent angina - symptoms of chest tightness.  Presented to [facility].  Cardiac catheterization.  The patient stated nonobstructive CAD seen.  Plavix restarted at that time.   03/2009 - 2D echo:  EF 35%, akinetic apex and septum.  Stress echocardiogram deferred for cardiac catheterization.   03/30/2009:  PCI 90% proximal LAD with a 3.0 x 18 mm Xience DES.   02/2010: Slow and gradual return of chest tightness, fatigue and generalized weakness concerning for return of anginal equivalent.  Stress test did not show ischemia to the heart muscle during exercise.  Evidenc   CHF (congestive heart failure) (HCC)    Chronic kidney disease    kidney stone left side   COPD (chronic  obstructive pulmonary disease) (HCC) 07/03/2017   Dyslipidemia 02/13/2012   Overview:   03/2009  TC 158, TG 88, HDL 39, LDL 102.  11/2013  TC 148, TG 115, HDL 44.3, LDL 80.7 (done at PCP's office)   10/2014 TC 146  11/2015 TC 123, TG 86, HDL 41.7, LDL 64  11/2016 TC 140, TG 129, HDL 40.1, LDL 74    Dyspnea    GERD (gastroesophageal reflux disease)    GI disease 03/09/2013   Overview:   Dysphagia - EGD demonstrated esophageal stricture which was dilated   GERD   Hemorrhoids    Hearing loss 03/09/2013   History of kidney stones    Hyperlipidemia, unspecified 07/03/2017   Hypertension 02/13/2012   Myocardial infarction [facility]) 2004   Personal history of gout 12/09/2016   Primary osteoarthritis of both knees 05/05/2015   Right-sided low back pain without sciatica 04/14/2016   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   R ear - ED&C    Past Surgical History:  Procedure Laterality Date   colonoscopy with polypectomy     COLONOSCOPY WITH PROPOFOL  N/A 10/08/2018   Procedure: COLONOSCOPY WITH PROPOFOL ;  Surgeon: Viktoria Lamar DASEN, MD;  Location: [facility] ENDOSCOPY;  Service: Endoscopy;  Laterality: N/A;   COLONOSCOPY WITH PROPOFOL  N/A 01/06/2023   Procedure: COLONOSCOPY WITH PROPOFOL ;  Surgeon: Maryruth Ole DASEN, MD;  Location: [facility] ENDOSCOPY;  Service:  Endoscopy;  Laterality: N/A;   coronary stents     CYSTOSCOPY WITH STENT PLACEMENT Left 08/09/2017   Procedure: CYSTOSCOPY WITH STENT PLACEMENT;  Surgeon: Chauncey Redell Agent, MD;  Location: [facility] ORS;  Service: Urology;  Laterality: Left;   FLEXIBLE SIGMOIDOSCOPY N/A 03/17/2024   Procedure: SIGMOIDOSCOPY, FLEXIBLE;  Surgeon: Toledo, Ladell POUR, MD;  Location: [facility] ENDOSCOPY;  Service: Gastroenterology;  Laterality: N/A;   MEDIAL PARTIAL KNEE REPLACEMENT Bilateral 2016   URETEROSCOPY WITH HOLMIUM LASER LITHOTRIPSY Left 08/09/2017   Procedure: URETEROSCOPY WITH HOLMIUM LASER LITHOTRIPSY/INCISION OF URETEROCELE;  Surgeon: Chauncey Redell Agent, MD;   Location: [facility] ORS;  Service: Urology;  Laterality: Left;   XI ROBOT ASSISTED DIAGNOSTIC LAPAROSCOPY N/A 03/20/2024   Procedure: LAPAROSCOPY, DIAGNOSTIC, ROBOT-ASSISTED;  Surgeon: Jordis Laneta FALCON, MD;  Location: [facility] ORS;  Service: General;  Laterality: N/A;    Family History  Problem Relation Age of Onset   Alzheimer's disease Mother    Leukemia Father    Prostate cancer Neg Hx    Kidney cancer Neg Hx    Bladder Cancer Neg Hx     Social History:  reports that he has quit smoking. He quit smokeless tobacco use about 30 years ago.  His smokeless tobacco use included chew. He reports that he does not drink alcohol  and does not use drugs.  Allergies: No Known Allergies  Medications reviewed.    ROS Full ROS performed and is otherwise negative other than what is stated in HPI   BP 137/70   Pulse 71   Temp 98 F (36.7 C) (Oral)   Ht 5' 9 (1.753 m)   Wt 181 lb (82.1 kg)   SpO2 97%   BMI 26.73 kg/m   Physical Exam Vitals and nursing note reviewed. Exam conducted with a chaperone present.  Constitutional:      Appearance: Normal appearance.  Cardiovascular:     Rate and Rhythm: Normal rate and regular rhythm.  Pulmonary:     Effort: Pulmonary effort is normal. No respiratory distress.     Breath sounds: Normal breath sounds. No stridor. No wheezing or rhonchi.  Abdominal:     General: Abdomen is flat. There is no distension.     Palpations: Abdomen is soft. There is no mass.     Tenderness: There is no abdominal tenderness. There is no guarding or rebound.     Hernia: No hernia is present.     Comments: Ileostomy in place, patent, parastomal hernia, moderate size  Musculoskeletal:     Cervical back: Normal range of motion and neck supple. No rigidity or tenderness.  Skin:    General: Skin is warm and dry.     Capillary Refill: Capillary refill takes less than 2 seconds.  Neurological:     General: No focal deficit present.     Mental Status: He is alert and oriented to  person, place, and time.  Psychiatric:        Behavior: Behavior normal.        Thought Content: Thought content normal.        Judgment: Judgment normal.       Assessment/Plan: 83 year old male with history of Ogilvie's status post loop ileostomy , with moderate-sized parastomal hernia.  He wishes to have this reversed.  Discussed with him in detail about his disease process.  I do think that he is as good as good to get from a medical standpoint. He Wishes to move forward with ileostomy takedown.  Procedure discussed with him and his  wife in detail.  Risk, benefits and possible complications including but not limited to: Bleeding, infection, anastomotic leak, pain. We will hold eliquis  I personally spent a total of 40 minutes in the care of the patient today including performing a medically appropriate exam/evaluation, counseling and educating, placing orders, referring and communicating with other health care professionals, documenting clinical information in the EHR, independently interpreting and reviewing images studies and coordinating care.   Laneta Luna, MD [facility] General Surgeon

## 2024-08-05 NOTE — H&P (View-Only) (Signed)
 Outpatient Surgical Follow Up  08/05/2024  Todd Rojas is an 83 y.o. male.   Chief Complaint  Patient presents with   Follow-up    HPI:  Todd Rojas is an 83 year old male very well-known to me.  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  It have hospitalization for failure to thrive and some pneumonia.  He has recovered and has regained his strength. He has been using the tractor and mowing.SABRA  He is eating his ostomy is working well.  No fevers no chills.  Recent CT and BE pers reviewed showing no obstructing lesions, parastomal component.   Past Medical History:  Diagnosis Date   Actinic keratosis    Arthritis    Asthma    Basal cell carcinoma 03/17/2020   right ant lat deltoid   CAD (coronary artery disease) 02/13/2012   Overview:   12/2004 - Anginal equivalent symptoms:  Left arm pain, weakness, and shortness of breath.   12/2004:  Anterior STEMI.  Transferred to [facility].   01/06/2005 - Cardiac cath:  EF 51%, an occluded mid LAD and a large OM2 with 75% ostial stenosis.  PCI of the mid occluded LAD using a Cypher stent.   03/2005 - 2D echo:  EF >55%, LAE 4.7 cm, no significant valvular disease.   02/2008:  Plavix stopped.   06/2008:  Recurrent angina - symptoms of chest tightness.  Presented to [facility].  Cardiac catheterization.  The patient stated nonobstructive CAD seen.  Plavix restarted at that time.   03/2009 - 2D echo:  EF 35%, akinetic apex and septum.  Stress echocardiogram deferred for cardiac catheterization.   03/30/2009:  PCI 90% proximal LAD with a 3.0 x 18 mm Xience DES.   02/2010: Slow and gradual return of chest tightness, fatigue and generalized weakness concerning for return of anginal equivalent.  Stress test did not show ischemia to the heart muscle during exercise.  Evidenc   CHF (congestive heart failure) (HCC)    Chronic kidney disease    kidney stone left side   COPD (chronic  obstructive pulmonary disease) (HCC) 07/03/2017   Dyslipidemia 02/13/2012   Overview:   03/2009  TC 158, TG 88, HDL 39, LDL 102.  11/2013  TC 148, TG 115, HDL 44.3, LDL 80.7 (done at PCP's office)   10/2014 TC 146  11/2015 TC 123, TG 86, HDL 41.7, LDL 64  11/2016 TC 140, TG 129, HDL 40.1, LDL 74    Dyspnea    GERD (gastroesophageal reflux disease)    GI disease 03/09/2013   Overview:   Dysphagia - EGD demonstrated esophageal stricture which was dilated   GERD   Hemorrhoids    Hearing loss 03/09/2013   History of kidney stones    Hyperlipidemia, unspecified 07/03/2017   Hypertension 02/13/2012   Myocardial infarction [facility]) 2004   Personal history of gout 12/09/2016   Primary osteoarthritis of both knees 05/05/2015   Right-sided low back pain without sciatica 04/14/2016   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   R ear - ED&C    Past Surgical History:  Procedure Laterality Date   colonoscopy with polypectomy     COLONOSCOPY WITH PROPOFOL  N/A 10/08/2018   Procedure: COLONOSCOPY WITH PROPOFOL ;  Surgeon: Viktoria Lamar DASEN, MD;  Location: [facility] ENDOSCOPY;  Service: Endoscopy;  Laterality: N/A;   COLONOSCOPY WITH PROPOFOL  N/A 01/06/2023   Procedure: COLONOSCOPY WITH PROPOFOL ;  Surgeon: Maryruth Ole DASEN, MD;  Location: [facility] ENDOSCOPY;  Service:  Endoscopy;  Laterality: N/A;   coronary stents     CYSTOSCOPY WITH STENT PLACEMENT Left 08/09/2017   Procedure: CYSTOSCOPY WITH STENT PLACEMENT;  Surgeon: Chauncey Redell Agent, MD;  Location: [facility] ORS;  Service: Urology;  Laterality: Left;   FLEXIBLE SIGMOIDOSCOPY N/A 03/17/2024   Procedure: SIGMOIDOSCOPY, FLEXIBLE;  Surgeon: Toledo, Ladell POUR, MD;  Location: [facility] ENDOSCOPY;  Service: Gastroenterology;  Laterality: N/A;   MEDIAL PARTIAL KNEE REPLACEMENT Bilateral 2016   URETEROSCOPY WITH HOLMIUM LASER LITHOTRIPSY Left 08/09/2017   Procedure: URETEROSCOPY WITH HOLMIUM LASER LITHOTRIPSY/INCISION OF URETEROCELE;  Surgeon: Chauncey Redell Agent, MD;   Location: [facility] ORS;  Service: Urology;  Laterality: Left;   XI ROBOT ASSISTED DIAGNOSTIC LAPAROSCOPY N/A 03/20/2024   Procedure: LAPAROSCOPY, DIAGNOSTIC, ROBOT-ASSISTED;  Surgeon: Jordis Laneta FALCON, MD;  Location: [facility] ORS;  Service: General;  Laterality: N/A;    Family History  Problem Relation Age of Onset   Alzheimer's disease Mother    Leukemia Father    Prostate cancer Neg Hx    Kidney cancer Neg Hx    Bladder Cancer Neg Hx     Social History:  reports that he has quit smoking. He quit smokeless tobacco use about 30 years ago.  His smokeless tobacco use included chew. He reports that he does not drink alcohol  and does not use drugs.  Allergies: No Known Allergies  Medications reviewed.    ROS Full ROS performed and is otherwise negative other than what is stated in HPI   BP 137/70   Pulse 71   Temp 98 F (36.7 C) (Oral)   Ht 5' 9 (1.753 m)   Wt 181 lb (82.1 kg)   SpO2 97%   BMI 26.73 kg/m   Physical Exam Vitals and nursing note reviewed. Exam conducted with a chaperone present.  Constitutional:      Appearance: Normal appearance.  Cardiovascular:     Rate and Rhythm: Normal rate and regular rhythm.  Pulmonary:     Effort: Pulmonary effort is normal. No respiratory distress.     Breath sounds: Normal breath sounds. No stridor. No wheezing or rhonchi.  Abdominal:     General: Abdomen is flat. There is no distension.     Palpations: Abdomen is soft. There is no mass.     Tenderness: There is no abdominal tenderness. There is no guarding or rebound.     Hernia: No hernia is present.     Comments: Ileostomy in place, patent, parastomal hernia, moderate size  Musculoskeletal:     Cervical back: Normal range of motion and neck supple. No rigidity or tenderness.  Skin:    General: Skin is warm and dry.     Capillary Refill: Capillary refill takes less than 2 seconds.  Neurological:     General: No focal deficit present.     Mental Status: He is alert and oriented to  person, place, and time.  Psychiatric:        Behavior: Behavior normal.        Thought Content: Thought content normal.        Judgment: Judgment normal.       Assessment/Plan: 83 year old male with history of Ogilvie's status post loop ileostomy , with moderate-sized parastomal hernia.  He wishes to have this reversed.  Discussed with him in detail about his disease process.  I do think that he is as good as good to get from a medical standpoint. He Wishes to move forward with ileostomy takedown.  Procedure discussed with him and his  wife in detail.  Risk, benefits and possible complications including but not limited to: Bleeding, infection, anastomotic leak, pain. We will hold eliquis  I personally spent a total of 40 minutes in the care of the patient today including performing a medically appropriate exam/evaluation, counseling and educating, placing orders, referring and communicating with other health care professionals, documenting clinical information in the EHR, independently interpreting and reviewing images studies and coordinating care.   Laneta Luna, MD [facility] General Surgeon

## 2024-08-06 ENCOUNTER — Ambulatory Visit: Admitting: Urology

## 2024-08-06 ENCOUNTER — Encounter: Payer: Self-pay | Admitting: Urology

## 2024-08-06 ENCOUNTER — Other Ambulatory Visit: Payer: Self-pay | Admitting: Urology

## 2024-08-06 VITALS — BP 150/71 | HR 69 | Ht 69.0 in | Wt 180.0 lb

## 2024-08-06 DIAGNOSIS — N4289 Other specified disorders of prostate: Secondary | ICD-10-CM

## 2024-08-06 DIAGNOSIS — Z2989 Encounter for other specified prophylactic measures: Secondary | ICD-10-CM | POA: Diagnosis not present

## 2024-08-06 DIAGNOSIS — R972 Elevated prostate specific antigen [PSA]: Secondary | ICD-10-CM

## 2024-08-06 MED ORDER — LEVOFLOXACIN 500 MG PO TABS
500.0000 mg | ORAL_TABLET | Freq: Once | ORAL | Status: AC
  Administered 2024-08-06: 500 mg via ORAL

## 2024-08-06 MED ORDER — GENTAMICIN SULFATE 40 MG/ML IJ SOLN
80.0000 mg | Freq: Once | INTRAMUSCULAR | Status: AC
  Administered 2024-08-06: 80 mg via INTRAMUSCULAR

## 2024-08-06 NOTE — Progress Notes (Signed)
   Prostate Biopsy Procedure   Informed consent was obtained after discussing risks/benefits of the procedure.  A time out was performed to ensure correct patient identity.  Pre-Procedure: - Last PSA Level: 4.06 on 05/23/2024; baseline upper 2-upper 3 range; CT incidentally showed 2.8 cm area of increased attenuation posterolateral aspect of right prostate.  MRI prostate 07/12/2024 with a PI-RADS 5 lesion left posteromedial and posterolateral mid gland PZ - Benign DRE - Gentamicin  given prophylactically - Levaquin  500 mg administered PO -Transrectal Ultrasound performed revealing a 82 gm prostate - Hypoechoic area left PZ at mid gland corresponding to MRI findings  Procedure: - Prostate block performed using 10 cc 1% lidocaine  and biopsies taken from sextant areas, a total of 9 (6L/3R) under ultrasound guidance.  Only 9 biopsies performed secondary to patient discomfort.  The hypoechoic area was included in the mid and apical biopsies  Post-Procedure: - Patient tolerated the procedure well - He was counseled to seek immediate medical attention if experiences any severe pain, significant bleeding, or fevers - Return in one week to discuss biopsy results   Glendia Barba, MD

## 2024-08-07 ENCOUNTER — Other Ambulatory Visit: Payer: Self-pay

## 2024-08-07 ENCOUNTER — Encounter
Admission: RE | Admit: 2024-08-07 | Discharge: 2024-08-07 | Disposition: A | Discharge status: Home / Self Care | Source: Ambulatory Visit | Attending: Surgery | Admitting: Surgery

## 2024-08-07 DIAGNOSIS — Z79899 Other long term (current) drug therapy: Secondary | ICD-10-CM

## 2024-08-07 HISTORY — DX: Angina pectoris, unspecified: I20.9

## 2024-08-07 HISTORY — DX: Parastomal hernia without obstruction or gangrene: K43.5

## 2024-08-07 HISTORY — DX: Pneumonia, unspecified organism: J18.9

## 2024-08-07 NOTE — Patient Instructions (Addendum)
 Your procedure is scheduled on: 08/09/24 Report to the Registration Desk on the 1st floor of the [facility]. To find out your arrival time, please call [phone] between 1PM - 3PM on: 08/08/24 If your arrival time is 6:00 am, do not arrive before that time as the [facility] entrance doors do not open until 6:00 am.  REMEMBER: Instructions that are not followed completely may result in serious medical risk, up to and including death; or upon the discretion of your surgeon and anesthesiologist your surgery may need to be rescheduled.  Do not eat food or drink any liquids after midnight the night before surgery.  No gum chewing or hard candies.   One week prior to surgery: Stop Anti-inflammatories (NSAIDS) such as Advil , Aleve, Ibuprofen , Motrin , Naproxen, Naprosyn and Aspirin  based products such as Excedrin, Goody's Powder, BC Powder. You may continue to take Tylenol  if needed for pain up until the day of surgery.  Stop ANY OVER THE COUNTER supplements until after surgery.  Continue HOLD ELIQUIS  , resume taking with doctors order.  ON THE DAY OF SURGERY ONLY TAKE THESE MEDICATIONS WITH SIPS OF WATER:  SYMBICORT   Use inhalers on the day of surgery and bring to the hospital.   No Alcohol  for 24 hours before or after surgery.  No Smoking including e-cigarettes for 24 hours before surgery.  No chewable tobacco products for at least 6 hours before surgery.  No nicotine patches on the day of surgery.  Do not use any recreational drugs for at least a week (preferably 2 weeks) before your surgery.  Please be advised that the combination of cocaine and anesthesia may have negative outcomes, up to and including death. If you test positive for cocaine, your surgery will be cancelled.  On the morning of surgery brush your teeth with toothpaste and water, you may rinse your mouth with mouthwash if you wish. Do not swallow any toothpaste or mouthwash.  Use CHG Soap or wipes as  directed on instruction sheet.  Do not wear jewelry, make-up, hairpins, clips or nail polish.  For welded (permanent) jewelry: bracelets, anklets, waist bands, etc.  Please have this removed prior to surgery.  If it is not removed, there is a chance that hospital personnel will need to cut it off on the day of surgery.  Do not wear lotions, powders, or perfumes.   Do not shave body hair from the neck down 48 hours before surgery.  Contact lenses, hearing aids and dentures may not be worn into surgery.  Do not bring valuables to the hospital. [facility] is not responsible for any missing/lost belongings or valuables.   Notify your doctor if there is any change in your medical condition (cold, fever, infection).  Wear comfortable clothing (specific to your surgery type) to the hospital.  After surgery, you can help prevent lung complications by doing breathing exercises.  Take deep breaths and cough every 1-2 hours. Your doctor may order a device called an Incentive Spirometer to help you take deep breaths.  When coughing or sneezing, hold a pillow firmly against your incision with both hands. This is called "splinting." Doing this helps protect your incision. It also decreases belly discomfort.  If you are being admitted to the hospital overnight, leave your suitcase in the car. After surgery it may be brought to your room.  In case of increased patient census, it may be necessary for you, the patient, to continue your postoperative care in the Same Day Surgery department.  If you are being discharged the day of surgery, you will not be allowed to drive home. You will need a responsible individual to drive you home and stay with you for 24 hours after surgery.   If you are taking public transportation, you will need to have a responsible individual with you.  Please call the Pre-admissions Testing Dept. at [phone] if you have any questions about these instructions.  Surgery  Visitation Policy:  Patients having surgery or a procedure may have two visitors.  Children under the age of 39 must have an adult with them who is not the patient.  Inpatient Visitation:    Visiting hours are 7 a.m. to 8 p.m. Up to four visitors are allowed at one time in a patient room. The visitors may rotate out with other people during the day.  One visitor age 12 or older may stay with the patient overnight and must be in the room by 8 p.m.   Merchandiser, retail to address health-related social needs:  [email or web address]     Preparing for Surgery with CHLORHEXIDINE  GLUCONATE (CHG) Soap  Chlorhexidine  Gluconate (CHG) Soap  o An antiseptic cleaner that kills germs and bonds with the skin to continue killing germs even after washing  o Used for showering the night before surgery and morning of surgery  Before surgery, you can play an important role by reducing the number of germs on your skin.  CHG (Chlorhexidine  gluconate) soap is an antiseptic cleanser which kills germs and bonds with the skin to continue killing germs even after washing.  Please do not use if you have an allergy to CHG or antibacterial soaps. If your skin becomes reddened/irritated stop using the CHG.  1. Shower the NIGHT BEFORE SURGERY and the MORNING OF SURGERY with CHG soap.  2. If you choose to wash your hair, wash your hair first as usual with your normal shampoo.  3. After shampooing, rinse your hair and body thoroughly to remove the shampoo.  4. Use CHG as you would any other liquid soap. You can apply CHG directly to the skin and wash gently with a scrungie or a clean washcloth.  5. Apply the CHG soap to your body only from the neck down. Do not use on open wounds or open sores. Avoid contact with your eyes, ears, mouth, and genitals (private parts). Wash face and genitals (private parts) with your normal soap.  6. Wash thoroughly, paying special attention to the area where  your surgery will be performed.  7. Thoroughly rinse your body with warm water.  8. Do not shower/wash with your normal soap after using and rinsing off the CHG soap.  9. Pat yourself dry with a clean towel.  10. Wear clean pajamas to bed the night before surgery.  12. Place clean sheets on your bed the night of your first shower and do not sleep with pets.  13. Shower again with the CHG soap on the day of surgery prior to arriving at the hospital.  14. Do not apply any deodorants/lotions/powders.  15. Please wear clean clothes to the hospital.

## 2024-08-08 ENCOUNTER — Encounter: Payer: Self-pay | Admitting: Surgery

## 2024-08-08 ENCOUNTER — Encounter
Admission: RE | Admit: 2024-08-08 | Discharge: 2024-08-08 | Disposition: A | Discharge status: Home / Self Care | Source: Ambulatory Visit | Attending: Surgery | Admitting: Surgery

## 2024-08-08 ENCOUNTER — Other Ambulatory Visit

## 2024-08-08 DIAGNOSIS — Z01812 Encounter for preprocedural laboratory examination: Secondary | ICD-10-CM | POA: Insufficient documentation

## 2024-08-08 DIAGNOSIS — Z79899 Other long term (current) drug therapy: Secondary | ICD-10-CM | POA: Insufficient documentation

## 2024-08-08 DIAGNOSIS — Z932 Ileostomy status: Secondary | ICD-10-CM | POA: Insufficient documentation

## 2024-08-08 LAB — BASIC METABOLIC PANEL WITH GFR
Anion gap: 12 (ref 5–15)
BUN: 25 mg/dL — ABNORMAL HIGH (ref 8–23)
CO2: 23 mmol/L (ref 22–32)
Calcium: 9.5 mg/dL (ref 8.9–10.3)
Chloride: 106 mmol/L (ref 98–111)
Creatinine, Ser: 1.85 mg/dL — ABNORMAL HIGH (ref 0.61–1.24)
GFR, Estimated: 36 mL/min — ABNORMAL LOW (ref >60)
Glucose, Bld: 150 mg/dL — ABNORMAL HIGH (ref 70–99)
Potassium: 4.2 mmol/L (ref 3.5–5.1)
Sodium: 141 mmol/L (ref 135–145)

## 2024-08-08 LAB — PROSTATE CORE NEEDLE BIOPSY

## 2024-08-08 LAB — TYPE AND SCREEN
ABO/RH(D): A POS
Antibody Screen: NEGATIVE

## 2024-08-08 NOTE — Progress Notes (Addendum)
 Perioperative / Anesthesia Services  Pre-Admission Testing Clinical Review / Pre-Operative Anesthesia Consult  Date: 08/08/24  PATIENT DEMOGRAPHICS: Name: Todd Rojas DOB: [birth year] MRN:   [id number]  Note: Available PAT nursing documentation and vital signs have been reviewed. Clinical nursing staff has updated patient's PMH/PSHx, current medication list, and drug allergies/intolerances to ensure complete and comprehensive history available to assist care teams in MDM as it pertains to the aforementioned surgical procedure and anticipated anesthetic course. Extensive review of available clinical information personally performed. Todd Rojas PMH and PSHx updated with any diagnoses/procedures that  may have been inadvertently omitted during his intake with the pre-admission testing department's nursing staff.  PLANNED SURGICAL PROCEDURE(S):   Case: [id number] Date/Time: 08/09/24 0845   Procedures:      CLOSURE, ILEOSTOMY     REPAIR, HERNIA, PARASTOMAL   Anesthesia type: General   Diagnosis:      Ileostomy in place [facility]) [Z93.2]     Parastomal hernia without obstruction or gangrene [K43.5]   Pre-op diagnosis:      Ileostomy in place     Parastomal Hernia   Location: [facility] OR ROOM 06 / [facility]   Surgeons: Todd Todd FALCON, MD        CLINICAL DISCUSSION: Todd Rojas is a 83 y.o. male who is submitted for pre-surgical anesthesia review and clearance prior to him undergoing the above procedure. Patient is a Former Games developer. Pertinent PMH includes: CAD, anterior STEMI, NICM, HFrEF, aortic atherosclerosis, angina, pulmonary embolism, HTN, HLD, thyroid  nodule, dyspnea, COPD, asthma, GERD (no daily Tx), hiatal hernia, esophageal stricture status post dilation, nephrolithiasis, parastomal hernia, ileostomy in place, enlarged prostate, prostate mass, OA, thoracolumbar DDD.   Patient is followed by cardiology Mathias, MD). He was last seen in the cardiology  clinic on 06/04/2024; notes reviewed. At the time of his clinic visit, patient being seen in hospital follow-up following extended 5-week admission due to an infection that led to sepsis.  Patient doing better following discharge, however he notes that he has lost a total of 40 pounds since his admission (attributes to metallic dysgeusia), he is weak, and he is experiencing shortness of breath.  Patient denies any chest pain, PND, orthopnea, palpitations, significant peripheral edema, vertiginous symptoms, or presyncope/syncope. Patient with a past medical history significant for cardiovascular diagnoses. Documented physical exam was grossly benign, providing no evidence of acute exacerbation and/or decompensation of the patient's known cardiovascular conditions.  Patient suffered an anterolateral STEMI on 01/06/2005.  Diagnostic LEFT heart catheterization was performed revealing 100% stenosis in the mid LAD and 75% stenosis in the ostial OM2.  PCI was subsequently performed placing a 3.0 x 23 mm Cypher stent to the occluded mLAD lesion yielding excellent angiographic result and TIMI-3 flow.  Diagnostic LEFT heart catheterization on 05/16/2008 revealing multivessel CAD; 40% proximal LAD-1, 60% proximal LAD-2, 50% D1, and a 70% ostial OM1.  There was 50-60% ISR within the previously placed stent to the mid LAD.  Interventional cardiology made the decision to defer further intervention at that time opting for aggressive secondary prevention through medical management.  Diagnostic LEFT heart catheterization on 03/30/2009 demonstrated a 90% stenosis of the proximal LAD.  PTCA was performed followed by PCI placing a 3.0 x 18 mm Xience V DES x 1 to the proximal LAD.  Procedure yielded excellent angiographic result and TIMI-3 flow.  Most recent myocardial perfusion imaging study was performed on 06/23/2015 revealing a moderately reduced left ventricular systolic function with an EF of 43%.  There was severe  hypokinesis of the anterior wall and apex.  Additionally, mild septal hypokinesis is also observed.   Left ventricle mildly dilated.  No artifact or left ventricular cavity size enlargement appreciated on review of imaging. SPECT images did not demonstrate any significant reversible ischemia.  Findings consistent with a nonischemic cardiomyopathy.  Diagnostic LEFT heart catheterization on 04/12/2018 demonstrated no significant change in coronary angiography since his last catheterization.  There was ostial diagonal disease, however the LAD was patent.  Ostial LCx with a 40% lesion.  Further intervention was deferred opting for medical management.  Most recent cardiac catheterization was performed on 06/03/2021.  Procedure revealed multivessel CAD; 90% LAD, 70% proximal LCx, and 70% OM1.  PTCA of the LAD lesion was performed.  PCI placing a 3.5 x 12 mm DES (unknown type) to the proximal LCx lesion and a 2.5 x 18 mm DES (unknown type) to the OM1 lesion.  Procedure yielded excellent angiographic result and TIMI-3 flow.  Most recent TTE performed on 05/17/2022 revealed a normal left ventricular systolic function with an EF of >55% %. There were no regional wall motion abnormalities. Left ventricular diastolic Doppler parameters were normal.  Right ventricular size and function normal with a TAPSE measuring 2.3 cm  (normal range >/= 1.6 cm). There was trivial mitral, tricuspid, and pulmonary valve regurgitation.  All transvalvular gradients were noted to be normal providing no evidence of hemodynamically significant valvular stenosis. Aorta normal in size with no evidence of ectasia or aneurysmal dilatation.  NICM and resulting HFrEF being managed with diuretic (furosemide ) monotherapy; blood pressure well-controlled at 118/68 mmHg.  Patient is on rosuvastatin  for his HLD diagnosis and further ASCVD prevention.  Patient has a supply of short acting nitrates (NTG) to use on a as needed basis for recurrent  angina/anginal equivalent symptoms; denied recent use.  Due to fairly recent pulmonary embolism, patient remains on daily anticoagulation therapy using a DOAC medication (apixaban ).  Patient reportedly compliant with therapy with no evidence reports of GI/GU related bleeding.  Patient is not diabetic.  He does not have an OSAH diagnosis.  Functional capacity limited by patient's age, multiple medical comorbidities, and deconditioning following recent extended admission.  With that said, patient able complete all of his ADLs/IADLs without cardiovascular limitations.  Per the DASI, patient felt to be able to achieve at least 4 METS of physical activity without experiencing any significant degree of angina/anginal equivalent symptoms.  No changes were made to his medication regimen during his visit with cardiology. Patient scheduled to follow-up with outpatient cardiology in 3 months or sooner if needed.  Todd Rojas is scheduled for an elective CLOSURE, ILEOSTOMY; PARASTOMAL HERNIA REPAIR on 08/09/2024 with Dr. Laneta Luna, MD. Given patient's past medical history significant for cardiovascular diagnoses, presurgical cardiac clearance was sought by the PAT team. .  Per cardiology, Todd Rojas is at LOW risk for perioperative cardiovascular complications from the proposed procedure.  He can proceed without delay from a cardiac standpoint.  He let us  know that he had already started to hold his Eliquis  due to a biopsy on Tuesday of this week.  Please call with any questions regarding patient.  Again, this patient is on daily oral anticoagulation therapy using a DOAC medication.  He has been instructed on recommendations for holding his apixaban .  Of note, patient has been holding his apixaban  for another procedure that he had earlier this week.  Patient unable to advise as to when his last dose was, however it  was either 08/02/2024 or 08/03/2024.  Patient to continue holding apixaban  until after procedure  with Dr. Jordis.   Patient denies previous perioperative complications with anesthesia in the past. In review his EMR, it is noted that patient underwent a general anesthetic course here at [facility] ([facility]) in 03/2024 without documented complications.   MOST RECENT VITAL SIGNS:    08/06/2024   10:20 AM 08/05/2024   10:09 AM 07/05/2024   11:30 AM  Vitals with BMI  Height 5' 9 5' 9   Weight 180 lbs 181 lbs   BMI 26.57 26.72   Systolic 150 137 897  Diastolic 71 70 60  Pulse 69 71 90   PROVIDERS/SPECIALISTS: NOTE: Primary physician provider listed below. Patient may have been seen by APP or partner within same practice.   PROVIDER ROLE / SPECIALTY LAST SHERLEAN Todd Todd JULIANNA, MD General Surgery (Surgeon) 08/05/2024  Sadie Manna, MD Primary Care Provider 05/17/2024  Tobie Rowan, MD Cardiology 06/04/2024  Twylla Hamilton, MD Urology 08/06/2024  Cherilyn Ned, MD Endocrinology 07/24/2024  Parris Manna, MD Pulmonary Medicine 05/23/2024   ALLERGIES: No Known Allergies  CURRENT HOME MEDICATIONS: No current facility-administered medications for this encounter.    acetaminophen  (TYLENOL ) 500 MG tablet   albuterol  (VENTOLIN  HFA) 108 (90 Base) MCG/ACT inhaler   allopurinol  (ZYLOPRIM ) 300 MG tablet   budesonide -formoterol  (SYMBICORT ) 160-4.5 MCG/ACT inhaler   colchicine 0.6 MG tablet   diclofenac  Sodium (VOLTAREN ) 1 % GEL   furosemide  (LASIX ) 20 MG tablet   nitroGLYCERIN  (NITROSTAT ) 0.4 MG SL tablet   ondansetron  (ZOFRAN -ODT) 4 MG disintegrating tablet   Probiotic Product (PROBIOTIC PO)   rosuvastatin  (CRESTOR ) 20 MG tablet   vitamin B-12 (CYANOCOBALAMIN ) 1000 MCG tablet   apixaban  (ELIQUIS ) 5 MG TABS tablet   HISTORY: Past Medical History:  Diagnosis Date   Actinic keratosis    Acute ST elevation myocardial infarction (STEMI) of anterior wall (HCC) 01/06/2005   a.) PCI 04/06/2005:100% mLAD (3.0 x 23 mm Cypher)   Adenomatous colon  polyp    Anginal pain (HCC)    Aortic atherosclerosis (HCC)    Arthritis    Asthma    Basal cell carcinoma 03/17/2020   right ant lat deltoid   CAD (coronary artery disease)    a.) Anterolateral STEMI 01/06/2005: 100% mLAD (3.0 x 23 mm Cypher); b.) LHC/PCI  03/30/2009: 90% pLAD (3.0 x 18 mm Xience DES); c.) LHC/PCI 06/03/2021: 90% LAD (PTCA), 70% pLCx (3.5 x 12 mm DES - unk type), 70% OM (2.5 x 18 mm DES - unk type)   Cholelithiasis    COPD (chronic obstructive pulmonary disease) (HCC) 07/03/2017   DDD (degenerative disc disease), thoracolumbar    Dyspnea    Enlarged prostate    Esophageal stricture s/p dilation    GERD (gastroesophageal reflux disease)    Gout    Hearing loss 03/09/2013   Hemorrhoids    HFrEF (heart failure with reduced ejection fraction) (HCC)    Hiatal hernia    Hyperlipidemia, unspecified 07/03/2017   Hypertension 02/13/2012   Ileus due to infection [facility])    a.) s/p ileostomy creation 03/20/2024   Nephrolithiasis    NICM (nonischemic cardiomyopathy) (HCC)    On apixaban  therapy    Parastomal hernia    Pneumonia    Prostate mass 07/09/2024   a.) 2.7 cm PZ lesion in the LEFTposteromedial and posterolateral mid gland - highly suspicious for high-grade carcinoma   Pulmonary embolism (HCC) 03/2024   Right-sided low  back pain without sciatica 04/14/2016   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   R ear - ED&C   Status post coronary artery stent placement    a.) TOTAL # STENTS: 4 as of 08/08/2024   Thyroid  nodule    Unintentional weight loss    a.) down 40+ lbs between 02/2024 - 05/2024 --> associated with sepsis, colonic ileus (odor associated with mantainence), and metallic dysgeusia.   Past Surgical History:  Procedure Laterality Date   COLONOSCOPY WITH PROPOFOL  N/A 10/08/2018   Procedure: COLONOSCOPY WITH PROPOFOL ;  Surgeon: Viktoria Lamar DASEN, MD;  Location: [facility] ENDOSCOPY;  Service: Endoscopy;  Laterality: N/A;   COLONOSCOPY WITH PROPOFOL  N/A  01/06/2023   Procedure: COLONOSCOPY WITH PROPOFOL ;  Surgeon: Maryruth Ole DASEN, MD;  Location: [facility] ENDOSCOPY;  Service: Endoscopy;  Laterality: N/A;   CORONARY ANGIOPLASTY WITH STENT PLACEMENT Left 01/06/2005   Procedure: CORONARY ANGIOPLASTY WITH STENT PLACEMENT; Location: [facility]; Surgeon: Marsa Dooms, MD   CORONARY ANGIOPLASTY WITH STENT PLACEMENT Left 06/03/2021   Procedure: CORONARY ANGIOPLASTY WITH STENT PLACEMENT; Location: [facility]; Surgeon: Manford Blanch, MD   CORONARY ANGIOPLASTY WITH STENT PLACEMENT Left 03/30/2009   Procedure: CORONARY ANGIOPLASTY WITH STENT PLACEMENT; Location: [facility]; Surgeon: Manford Blanch, MD   CREATION, ILEOSTOMY, DIVERTING, ROBOT-ASSISTED  03/2024   CYSTOSCOPY WITH STENT PLACEMENT Left 08/09/2017   Procedure: CYSTOSCOPY WITH STENT PLACEMENT;  Surgeon: Chauncey Redell Agent, MD;  Location: [facility] ORS;  Service: Urology;  Laterality: Left;   FLEXIBLE SIGMOIDOSCOPY N/A 03/17/2024   Procedure: SIGMOIDOSCOPY, FLEXIBLE;  Surgeon: Toledo, Ladell POUR, MD;  Location: [facility] ENDOSCOPY;  Service: Gastroenterology;  Laterality: N/A;   LEFT HEART CATH AND CORONARY ANGIOGRAPHY Left 05/16/2008   Procedure: LEFT HEART CATH AND CORONARY ANGIOGRAPHY; Location: [facility]; Surgeon: Marsa Overall, MD   LEFT HEART CATH AND CORONARY ANGIOGRAPHY Left 04/12/2018   Procedure: LEFT HEART CATH AND CORONARY ANGIOGRAPHY; Location: [facility]; Surgeon: Manford Blanch, MD   MEDIAL PARTIAL KNEE REPLACEMENT Bilateral 2016   URETEROSCOPY WITH HOLMIUM LASER LITHOTRIPSY Left 08/09/2017   Procedure: URETEROSCOPY WITH HOLMIUM LASER LITHOTRIPSY/INCISION OF URETEROCELE;  Surgeon: Chauncey Redell Agent, MD;  Location: [facility] ORS;  Service: Urology;  Laterality: Left;   XI ROBOT ASSISTED DIAGNOSTIC LAPAROSCOPY N/A 03/20/2024   Procedure: LAPAROSCOPY, DIAGNOSTIC, ROBOT-ASSISTED;  Surgeon: Todd Todd FALCON, MD;  Location: [facility] ORS;  Service: General;  Laterality: N/A;   Family History  Problem Relation Age of Onset    Alzheimer's disease Mother    Leukemia Father    Prostate cancer Neg Hx    Kidney cancer Neg Hx    Bladder Cancer Neg Hx    Social History   Tobacco Use   Smoking status: Former   Smokeless tobacco: Former    Types: Chew    Quit date: 08/07/1994  Substance Use Topics   Alcohol  use: No   LABS:  Hospital Outpatient Visit on 08/08/2024  Component Date Value Ref Range Status   Sodium 08/08/2024 141  135 - 145 mmol/L Final   Potassium 08/08/2024 4.2  3.5 - 5.1 mmol/L Final   Chloride 08/08/2024 106  98 - 111 mmol/L Final   CO2 08/08/2024 23  22 - 32 mmol/L Final   Glucose, Bld 08/08/2024 150 (H)  70 - 99 mg/dL Final   Glucose reference range applies only to samples taken after fasting for at least 8 hours.   BUN 08/08/2024 25 (H)  8 - 23 mg/dL Final   Creatinine, Ser 08/08/2024 1.85 (H)  0.61 - 1.24 mg/dL Final   Calcium  08/08/2024 9.5  8.9 - 10.3 mg/dL Final   GFR, Estimated 08/08/2024 36 (L)  >60 mL/min Final   Comment: (NOTE) Calculated using the CKD-EPI Creatinine Equation (2021)    Anion gap 08/08/2024 12  5 - 15 Final   Performed at [facility], [address] [place], [place] [address]    WBC 07/05/2024 12.9 (H)  4.0 - 10.5 K/uL Final   RBC 07/05/2024 4.60  4.22 - 5.81 MIL/uL Final   Hemoglobin 07/05/2024 14.9  13.0 - 17.0 g/dL Final   HCT 92/81/7974 45.5  39.0 - 52.0 % Final   MCV 07/05/2024 98.9  80.0 - 100.0 fL Final   MCH 07/05/2024 32.4  26.0 - 34.0 pg Final   MCHC 07/05/2024 32.7  30.0 - 36.0 g/dL Final   RDW 92/81/7974 15.3  11.5 - 15.5 % Final   Platelets 07/05/2024 252  150 - 400 K/uL Final   nRBC 07/05/2024 0.0  0.0 - 0.2 % Final   Performed at [facility], [address] [place], [place] [address]   Color, Urine 07/05/2024 YELLOW (A)  YELLOW Final   APPearance 07/05/2024 CLEAR (A)  CLEAR Final   Specific Gravity, Urine 07/05/2024 >1.046 (H)  1.005 - 1.030 Final   pH 07/05/2024 5.0  5.0 - 8.0 Final   Glucose, UA 07/05/2024 NEGATIVE   NEGATIVE mg/dL Final   Hgb urine dipstick 07/05/2024 NEGATIVE  NEGATIVE Final   Bilirubin Urine 07/05/2024 NEGATIVE  NEGATIVE Final   Ketones, ur 07/05/2024 NEGATIVE  NEGATIVE mg/dL Final   Protein, ur 92/81/7974 NEGATIVE  NEGATIVE mg/dL Final   Nitrite 92/81/7974 NEGATIVE  NEGATIVE Final   Leukocytes,Ua 07/05/2024 NEGATIVE  NEGATIVE Final   Performed at [facility], [address] [place], [place] [address]     ECG: Date: 04/11/2024  Time ECG obtained: 2341 PM Rate: 95 bpm Rhythm: Sinus rhythmwith first-degree AV block Axis (leads I and aVF): normal Intervals: PR 232 ms. QRS 108 ms. QTc 440 ms. ST segment and T wave changes: No evidence of acute T wave abnormalities or significant ST segment elevation or depression.  Evidence of a possible, age undetermined, prior infarct:  Yes; inferior Comparison: Similar to previous tracing obtained on 03/10/2023   IMAGING / PROCEDURES: CT ABDOMEN PELVIS W CONTRAST performed on 07/12/2024 Unchanged loop ileostomy in the right lower quadrant. Enteric contrast is present throughout the small bowel reaching the ileostomy. Enlarged prostate.  MR PROSTATE W WO CONTRAST performed on 07/09/2024 2.7 cm peripheral zone lesion in the left posteromedial and posterolateral mid gland, highly suspicious for high-grade carcinoma. Mild extracapsular extension also noted.  PI-RADS 5 (v2.1): Very high (clinically significant cancer highly likely). No evidence pelvic metastatic disease.  CT CHEST W CONTRAST performed on 04/16/2024 There is small volume occlusive embolism in the left lung lower lobe segmental pulmonary artery branch. No right heart strain or lung infarction. There is loculated small left and trace right pleural effusion. There are coronary artery calcifications, in keeping with coronary artery disease Mild peripheral atherosclerotic vascular calcifications of thoracic aorta and its major branches. There is dilation of the main  pulmonary trunk measuring up to 3.0 cm, which is nonspecific but can be seen with pulmonary artery hypertension. 1.0 x 2.3 cm hypoattenuating nodule in the left thyroid  lobe, incompletely characterized on the current exam but grossly unchanged since the prior study Few mildly prominent mediastinal and hilar lymph nodes, which do not meet the size criteria for lymphadenopathy and appear grossly similar to the prior study, favoring  benign etiology.  There is loculated small left and trace right pleural effusion. Small volume dependent gallstones/sludge noted without imaging signs of acute cholecystitis Small sliding hiatal hernia Mild multilevel degenerative changes  TRANSTHORACIC ECHOCARDIOGRAM performed on 03/11/2024 Left ventricular ejection fraction, by estimation, is 40%. The left ventricle has moderately decreased function. The left ventricle demonstrates regional wall motion abnormalities. There is mild left ventricular hypertrophy. Left ventricular diastolic parameters are consistent with Grade I diastolic dysfunction (impaired relaxation).  Right ventricular systolic function is normal. The right ventricular size is normal.  The mitral valve is normal in structure. Trivial mitral valve regurgitation.  The aortic valve is tricuspid. There is mild thickening of the aortic valve. Aortic valve regurgitation is not visualized.   LEFT HEART CATHETERIZATION AND CORONARY ANGIOGRAPHY performed on 06/03/2021 Multi-vessel CAD LAD with 90% stenosis  LCX with 70% proximal stenosis  OM1 with 70%  Discussed with other cardiology attendings and patient.  Given age and prior history team decided on PCI.  PCI of LAD with 3.25 post to 3.4 mm - IVUS with apposition and expansion  PCI of OM2 with 2.5 x 18 X DES and LXC ostium with 3.5 x 12 DES after IVUS. Stent expanded to 3.5 with IVUS post showing position and expansion good  We did not post LCX after IVUS showed apposition.  Recommendations:  PCI to  LAD, LCX and OM2  DAPT loaded on table and will switch to DAPT for 6 months to one year    IMPRESSION AND PLAN: Todd Rojas has been referred for pre-anesthesia review and clearance prior to him undergoing the planned anesthetic and procedural courses. Available labs, pertinent testing, and imaging results were personally reviewed by me in preparation for upcoming operative/procedural course. [facility] medical record has been updated following extensive record review and patient interview with PAT staff.   This patient has been appropriately cleared by cardiology with an overall LOW risk of patient experiencing significant perioperative cardiovascular complications. Based on clinical review performed today (08/08/24), barring any significant acute changes in the patient's overall condition, it is anticipated that he will be able to proceed with the planned surgical intervention. Any acute changes in clinical condition may necessitate his procedure being postponed and/or cancelled. Patient will meet with anesthesia team (MD and/or CRNA) on the day of his procedure for preoperative evaluation/assessment. Questions regarding anesthetic course will be fielded at that time.   Pre-surgical instructions were reviewed with the patient during his PAT appointment, and questions were fielded to satisfaction by PAT clinical staff. He has been instructed on which medications that he will need to hold prior to surgery, as well as the ones that have been deemed safe/appropriate to take on the day of his procedure. As part of the general education provided by PAT, patient made aware both verbally and in writing, that he would need to abstain from the use of any illegal substances during his perioperative course. He was advised that failure to follow the provided instructions could necessitate case cancellation or result in serious perioperative complications up to and including death. Patient encouraged to  contact PAT and/or his surgeon's office to discuss any questions or concerns that may arise prior to surgery; verbalized understanding.   Dorise Pereyra, MSN, APRN, FNP-C, CEN [facility]  Perioperative Services Nurse Practitioner Phone: [phone] Fax: [phone] 08/08/24 3:37 PM  NOTE: This note has been prepared using Dragon dictation software. Despite my best ability to proofread, there is always the potential  that unintentional transcriptional errors may still occur from this process.

## 2024-08-09 ENCOUNTER — Encounter
Admission: RE | Disposition: A | Discharge status: Home / Self Care | Payer: Self-pay | Source: Home / Self Care | Attending: Surgery

## 2024-08-09 ENCOUNTER — Other Ambulatory Visit: Payer: Self-pay

## 2024-08-09 ENCOUNTER — Inpatient Hospital Stay
Admission: RE | Admit: 2024-08-09 | Discharge: 2024-08-12 | DRG: 330 | Disposition: A | Discharge status: Home / Self Care | Attending: Surgery | Admitting: Surgery

## 2024-08-09 ENCOUNTER — Encounter: Payer: Self-pay | Admitting: Surgery

## 2024-08-09 ENCOUNTER — Inpatient Hospital Stay: Payer: Self-pay | Admitting: Urgent Care

## 2024-08-09 ENCOUNTER — Ambulatory Visit: Payer: Self-pay | Admitting: Urology

## 2024-08-09 DIAGNOSIS — N183 Chronic kidney disease, stage 3 unspecified: Secondary | ICD-10-CM | POA: Diagnosis present

## 2024-08-09 DIAGNOSIS — Z86711 Personal history of pulmonary embolism: Secondary | ICD-10-CM | POA: Diagnosis not present

## 2024-08-09 DIAGNOSIS — E785 Hyperlipidemia, unspecified: Secondary | ICD-10-CM | POA: Diagnosis present

## 2024-08-09 DIAGNOSIS — R627 Adult failure to thrive: Secondary | ICD-10-CM | POA: Diagnosis present

## 2024-08-09 DIAGNOSIS — I5022 Chronic systolic (congestive) heart failure: Secondary | ICD-10-CM | POA: Diagnosis present

## 2024-08-09 DIAGNOSIS — J4489 Other specified chronic obstructive pulmonary disease: Secondary | ICD-10-CM | POA: Diagnosis present

## 2024-08-09 DIAGNOSIS — Z806 Family history of leukemia: Secondary | ICD-10-CM | POA: Diagnosis not present

## 2024-08-09 DIAGNOSIS — I428 Other cardiomyopathies: Secondary | ICD-10-CM | POA: Diagnosis present

## 2024-08-09 DIAGNOSIS — Z9889 Other specified postprocedural states: Principal | ICD-10-CM | POA: Diagnosis present

## 2024-08-09 DIAGNOSIS — I2510 Atherosclerotic heart disease of native coronary artery without angina pectoris: Secondary | ICD-10-CM | POA: Diagnosis present

## 2024-08-09 DIAGNOSIS — Z85828 Personal history of other malignant neoplasm of skin: Secondary | ICD-10-CM

## 2024-08-09 DIAGNOSIS — I252 Old myocardial infarction: Secondary | ICD-10-CM

## 2024-08-09 DIAGNOSIS — Z432 Encounter for attention to ileostomy: Principal | ICD-10-CM

## 2024-08-09 DIAGNOSIS — M109 Gout, unspecified: Secondary | ICD-10-CM | POA: Diagnosis present

## 2024-08-09 DIAGNOSIS — N400 Enlarged prostate without lower urinary tract symptoms: Secondary | ICD-10-CM | POA: Diagnosis present

## 2024-08-09 DIAGNOSIS — Z955 Presence of coronary angioplasty implant and graft: Secondary | ICD-10-CM | POA: Diagnosis not present

## 2024-08-09 DIAGNOSIS — Z96653 Presence of artificial knee joint, bilateral: Secondary | ICD-10-CM | POA: Diagnosis present

## 2024-08-09 DIAGNOSIS — Z932 Ileostomy status: Secondary | ICD-10-CM

## 2024-08-09 DIAGNOSIS — Z87891 Personal history of nicotine dependence: Secondary | ICD-10-CM | POA: Diagnosis not present

## 2024-08-09 DIAGNOSIS — K435 Parastomal hernia without obstruction or  gangrene: Secondary | ICD-10-CM | POA: Diagnosis present

## 2024-08-09 DIAGNOSIS — H9190 Unspecified hearing loss, unspecified ear: Secondary | ICD-10-CM | POA: Diagnosis present

## 2024-08-09 DIAGNOSIS — Z820 Family history of epilepsy and other diseases of the nervous system: Secondary | ICD-10-CM

## 2024-08-09 DIAGNOSIS — I130 Hypertensive heart and chronic kidney disease with heart failure and stage 1 through stage 4 chronic kidney disease, or unspecified chronic kidney disease: Secondary | ICD-10-CM | POA: Diagnosis present

## 2024-08-09 DIAGNOSIS — K219 Gastro-esophageal reflux disease without esophagitis: Secondary | ICD-10-CM | POA: Diagnosis present

## 2024-08-09 HISTORY — PX: ILEOSTOMY CLOSURE: SHX1784

## 2024-08-09 HISTORY — DX: Unspecified systolic (congestive) heart failure: I50.20

## 2024-08-09 HISTORY — PX: PARASTOMAL HERNIA REPAIR: SHX2162

## 2024-08-09 HISTORY — DX: Presence of coronary angioplasty implant and graft: Z95.5

## 2024-08-09 HISTORY — DX: Other intervertebral disc degeneration, thoracolumbar region: M51.35

## 2024-08-09 HISTORY — DX: Unspecified hemorrhoids: K64.9

## 2024-08-09 HISTORY — DX: Nontoxic single thyroid nodule: E04.1

## 2024-08-09 HISTORY — DX: Benign neoplasm of colon, unspecified: D12.6

## 2024-08-09 HISTORY — DX: Benign prostatic hyperplasia without lower urinary tract symptoms: N40.0

## 2024-08-09 HISTORY — DX: Ileus, unspecified: K56.7

## 2024-08-09 HISTORY — DX: Calculus of kidney: N20.0

## 2024-08-09 HISTORY — DX: Calculus of gallbladder without cholecystitis without obstruction: K80.20

## 2024-08-09 HISTORY — DX: Long term (current) use of anticoagulants: Z79.01

## 2024-08-09 HISTORY — DX: Unspecified infectious disease: B99.9

## 2024-08-09 HISTORY — DX: Esophageal obstruction: K22.2

## 2024-08-09 HISTORY — DX: Other cardiomyopathies: I42.8

## 2024-08-09 HISTORY — DX: Atherosclerosis of aorta: I70.0

## 2024-08-09 HISTORY — DX: Diaphragmatic hernia without obstruction or gangrene: K44.9

## 2024-08-09 HISTORY — DX: Abnormal weight loss: R63.4

## 2024-08-09 HISTORY — DX: Gout, unspecified: M10.9

## 2024-08-09 SURGERY — CLOSURE, ILEOSTOMY
Anesthesia: General

## 2024-08-09 MED ORDER — CHLORHEXIDINE GLUCONATE CLOTH 2 % EX PADS
6.0000 | MEDICATED_PAD | Freq: Once | CUTANEOUS | Status: AC
  Administered 2024-08-09: 6 via TOPICAL

## 2024-08-09 MED ORDER — ROCURONIUM BROMIDE 100 MG/10ML IV SOLN
INTRAVENOUS | Status: DC | PRN
  Administered 2024-08-09: 10 mg via INTRAVENOUS
  Administered 2024-08-09: 60 mg via INTRAVENOUS

## 2024-08-09 MED ORDER — SODIUM CHLORIDE 0.9 % IV SOLN
INTRAVENOUS | Status: AC
  Filled 2024-08-09: qty 2

## 2024-08-09 MED ORDER — ONDANSETRON HCL 4 MG/2ML IJ SOLN
INTRAMUSCULAR | Status: DC | PRN
  Administered 2024-08-09 (×2): 4 mg via INTRAVENOUS

## 2024-08-09 MED ORDER — ONDANSETRON HCL 4 MG/2ML IJ SOLN: 4.0000 mg | Freq: Four times a day (QID) | INTRAMUSCULAR | Status: DC | PRN

## 2024-08-09 MED ORDER — CHLORHEXIDINE GLUCONATE 0.12 % MT SOLN
15.0000 mL | Freq: Once | OROMUCOSAL | Status: AC
  Administered 2024-08-09: 15 mL via OROMUCOSAL

## 2024-08-09 MED ORDER — PROPOFOL 10 MG/ML IV BOLUS
INTRAVENOUS | Status: DC | PRN
  Administered 2024-08-09: 150 mg via INTRAVENOUS
  Administered 2024-08-09: 30 mg via INTRAVENOUS

## 2024-08-09 MED ORDER — ALBUMIN HUMAN 5 % IV SOLN
INTRAVENOUS | Status: DC | PRN
Start: 2024-08-09 — End: 2024-08-09

## 2024-08-09 MED ORDER — HYDROMORPHONE HCL 1 MG/ML IJ SOLN
0.2500 mg | INTRAMUSCULAR | Status: DC | PRN
  Administered 2024-08-09 (×2): 0.5 mg via INTRAVENOUS

## 2024-08-09 MED ORDER — FLUTICASONE FUROATE-VILANTEROL 200-25 MCG/ACT IN AEPB
1.0000 | INHALATION_SPRAY | Freq: Every day | RESPIRATORY_TRACT | Status: DC
  Administered 2024-08-09 – 2024-08-11 (×2): 1 via RESPIRATORY_TRACT
  Filled 2024-08-09: qty 28

## 2024-08-09 MED ORDER — ACETAMINOPHEN 10 MG/ML IV SOLN
INTRAVENOUS | Status: AC
  Filled 2024-08-09: qty 100

## 2024-08-09 MED ORDER — CHLORHEXIDINE GLUCONATE 0.12 % MT SOLN
OROMUCOSAL | Status: AC
  Filled 2024-08-09: qty 15

## 2024-08-09 MED ORDER — FENTANYL CITRATE (PF) 100 MCG/2ML IJ SOLN
INTRAMUSCULAR | Status: AC
  Filled 2024-08-09: qty 2

## 2024-08-09 MED ORDER — DIPHENHYDRAMINE HCL 12.5 MG/5ML PO ELIX: 12.5000 mg | ORAL_SOLUTION | Freq: Four times a day (QID) | ORAL | Status: DC | PRN

## 2024-08-09 MED ORDER — PHENYLEPHRINE HCL-NACL 20-0.9 MG/250ML-% IV SOLN
INTRAVENOUS | Status: DC | PRN
Start: 2024-08-09 — End: 2024-08-09
  Administered 2024-08-09: 25 ug/min via INTRAVENOUS

## 2024-08-09 MED ORDER — ALBUMIN HUMAN 5 % IV SOLN
INTRAVENOUS | Status: AC
  Filled 2024-08-09: qty 500

## 2024-08-09 MED ORDER — ENOXAPARIN SODIUM 40 MG/0.4ML IJ SOSY
40.0000 mg | PREFILLED_SYRINGE | INTRAMUSCULAR | Status: DC
  Administered 2024-08-10: 40 mg via SUBCUTANEOUS
  Filled 2024-08-09: qty 0.4

## 2024-08-09 MED ORDER — GLYCOPYRROLATE 0.2 MG/ML IJ SOLN
INTRAMUSCULAR | Status: DC | PRN
  Administered 2024-08-09: .2 mg via INTRAVENOUS

## 2024-08-09 MED ORDER — OXYCODONE HCL 5 MG PO TABS: 5.0000 mg | ORAL_TABLET | ORAL | Status: DC | PRN

## 2024-08-09 MED ORDER — ALBUTEROL SULFATE (2.5 MG/3ML) 0.083% IN NEBU: 2.5000 mg | INHALATION_SOLUTION | Freq: Four times a day (QID) | RESPIRATORY_TRACT | Status: DC | PRN

## 2024-08-09 MED ORDER — SODIUM CHLORIDE (PF) 0.9 % IJ SOLN
INTRAMUSCULAR | Status: DC | PRN
  Administered 2024-08-09: 100 mL via INTRAMUSCULAR

## 2024-08-09 MED ORDER — PANTOPRAZOLE SODIUM 40 MG IV SOLR
40.0000 mg | Freq: Two times a day (BID) | INTRAVENOUS | Status: DC
  Administered 2024-08-09 – 2024-08-11 (×5): 40 mg via INTRAVENOUS
  Filled 2024-08-09 (×5): qty 10

## 2024-08-09 MED ORDER — BUPIVACAINE-EPINEPHRINE (PF) 0.25% -1:200000 IJ SOLN
INTRAMUSCULAR | Status: AC
  Filled 2024-08-09: qty 30

## 2024-08-09 MED ORDER — SUGAMMADEX SODIUM 200 MG/2ML IV SOLN
INTRAVENOUS | Status: DC | PRN
  Administered 2024-08-09: 240 mg via INTRAVENOUS

## 2024-08-09 MED ORDER — DIPHENHYDRAMINE HCL 50 MG/ML IJ SOLN: 12.5000 mg | Freq: Four times a day (QID) | INTRAMUSCULAR | Status: DC | PRN

## 2024-08-09 MED ORDER — SODIUM CHLORIDE 0.9 % IV SOLN: INTRAVENOUS | Status: AC

## 2024-08-09 MED ORDER — DEXMEDETOMIDINE HCL IN NACL 200 MCG/50ML IV SOLN
INTRAVENOUS | Status: DC | PRN
  Administered 2024-08-09: 12 ug via INTRAVENOUS

## 2024-08-09 MED ORDER — NITROGLYCERIN 0.4 MG SL SUBL: 0.4000 mg | SUBLINGUAL_TABLET | SUBLINGUAL | Status: DC | PRN

## 2024-08-09 MED ORDER — SODIUM CHLORIDE 0.9 % IV SOLN
2.0000 g | INTRAVENOUS | Status: AC
  Administered 2024-08-09: 2 g via INTRAVENOUS

## 2024-08-09 MED ORDER — ALLOPURINOL 100 MG PO TABS
300.0000 mg | ORAL_TABLET | Freq: Every morning | ORAL | Status: DC
  Administered 2024-08-10 – 2024-08-12 (×3): 300 mg via ORAL
  Filled 2024-08-09 (×3): qty 3

## 2024-08-09 MED ORDER — GABAPENTIN 300 MG PO CAPS
ORAL_CAPSULE | ORAL | Status: AC
  Filled 2024-08-09: qty 1

## 2024-08-09 MED ORDER — MIDAZOLAM HCL 2 MG/2ML IJ SOLN
INTRAMUSCULAR | Status: AC
  Filled 2024-08-09: qty 2

## 2024-08-09 MED ORDER — ACETAMINOPHEN 10 MG/ML IV SOLN: 1000.0000 mg | Freq: Once | INTRAVENOUS | Status: DC | PRN

## 2024-08-09 MED ORDER — ACETAMINOPHEN 500 MG PO TABS
1000.0000 mg | ORAL_TABLET | Freq: Four times a day (QID) | ORAL | Status: DC
  Administered 2024-08-09 – 2024-08-12 (×11): 1000 mg via ORAL
  Filled 2024-08-09 (×11): qty 2

## 2024-08-09 MED ORDER — DROPERIDOL 2.5 MG/ML IJ SOLN: 0.6250 mg | Freq: Once | INTRAMUSCULAR | Status: DC | PRN

## 2024-08-09 MED ORDER — PROPOFOL 1000 MG/100ML IV EMUL
INTRAVENOUS | Status: AC
  Filled 2024-08-09: qty 100

## 2024-08-09 MED ORDER — BUPIVACAINE LIPOSOME 1.3 % IJ SUSP
INTRAMUSCULAR | Status: AC
  Filled 2024-08-09: qty 20

## 2024-08-09 MED ORDER — ACETAMINOPHEN 500 MG PO TABS
1000.0000 mg | ORAL_TABLET | ORAL | Status: AC
  Administered 2024-08-09: 1000 mg via ORAL

## 2024-08-09 MED ORDER — FENTANYL CITRATE (PF) 100 MCG/2ML IJ SOLN
INTRAMUSCULAR | Status: AC
Start: 2024-08-09 — End: 2024-08-09
  Filled 2024-08-09: qty 2

## 2024-08-09 MED ORDER — ACETAMINOPHEN 500 MG PO TABS
ORAL_TABLET | ORAL | Status: AC
  Filled 2024-08-09: qty 2

## 2024-08-09 MED ORDER — HYDROMORPHONE HCL 1 MG/ML IJ SOLN
INTRAMUSCULAR | Status: AC
  Filled 2024-08-09: qty 1

## 2024-08-09 MED ORDER — HYDRALAZINE HCL 20 MG/ML IJ SOLN: 10.0000 mg | INTRAMUSCULAR | Status: DC | PRN

## 2024-08-09 MED ORDER — GABAPENTIN 300 MG PO CAPS
300.0000 mg | ORAL_CAPSULE | ORAL | Status: AC
  Administered 2024-08-09: 300 mg via ORAL

## 2024-08-09 MED ORDER — SODIUM CHLORIDE 0.9 % IV SOLN
2.0000 g | INTRAVENOUS | Status: AC
  Filled 2024-08-09: qty 2

## 2024-08-09 MED ORDER — ALVIMOPAN 12 MG PO CAPS
12.0000 mg | ORAL_CAPSULE | Freq: Two times a day (BID) | ORAL | Status: DC
  Administered 2024-08-10 – 2024-08-11 (×4): 12 mg via ORAL
  Filled 2024-08-09 (×5): qty 1

## 2024-08-09 MED ORDER — MORPHINE SULFATE (PF) 2 MG/ML IV SOLN: 2.0000 mg | INTRAVENOUS | Status: DC | PRN

## 2024-08-09 MED ORDER — EPHEDRINE SULFATE-NACL 50-0.9 MG/10ML-% IV SOSY
PREFILLED_SYRINGE | INTRAVENOUS | Status: DC | PRN
  Administered 2024-08-09: 10 mg via INTRAVENOUS

## 2024-08-09 MED ORDER — DEXAMETHASONE SODIUM PHOSPHATE 10 MG/ML IJ SOLN
INTRAMUSCULAR | Status: DC | PRN
  Administered 2024-08-09: 10 mg via INTRAVENOUS

## 2024-08-09 MED ORDER — LACTATED RINGERS IV SOLN: INTRAVENOUS | Status: DC

## 2024-08-09 MED ORDER — FENTANYL CITRATE (PF) 100 MCG/2ML IJ SOLN
INTRAMUSCULAR | Status: DC | PRN
  Administered 2024-08-09: 50 ug via INTRAVENOUS

## 2024-08-09 MED ORDER — ORAL CARE MOUTH RINSE: 15.0000 mL | Freq: Once | OROMUCOSAL | Status: AC

## 2024-08-09 MED ORDER — ALVIMOPAN 12 MG PO CAPS
12.0000 mg | ORAL_CAPSULE | ORAL | Status: AC
  Administered 2024-08-09: 12 mg via ORAL

## 2024-08-09 MED ORDER — LIDOCAINE HCL (CARDIAC) PF 100 MG/5ML IV SOSY
PREFILLED_SYRINGE | INTRAVENOUS | Status: DC | PRN
  Administered 2024-08-09: 100 mg via INTRAVENOUS

## 2024-08-09 MED ORDER — ONDANSETRON 4 MG PO TBDP
4.0000 mg | ORAL_TABLET | Freq: Four times a day (QID) | ORAL | Status: DC | PRN
Start: 2024-08-09 — End: 2024-08-12

## 2024-08-09 MED ORDER — ALVIMOPAN 12 MG PO CAPS
ORAL_CAPSULE | ORAL | Status: AC
  Filled 2024-08-09: qty 1

## 2024-08-09 MED ORDER — SODIUM CHLORIDE (PF) 0.9 % IJ SOLN
INTRAMUSCULAR | Status: AC
  Filled 2024-08-09: qty 50

## 2024-08-09 SURGICAL SUPPLY — 56 items
BLADE CLIPPER SURG (BLADE) ×1 IMPLANT
BLADE SURG 15 STRL LF DISP TIS (BLADE) ×1 IMPLANT
CATH URET ROBINSON 16FR STRL (CATHETERS) IMPLANT
CHLORAPREP W/TINT 26 (MISCELLANEOUS) ×1 IMPLANT
CLIP APPLIE 11 MED OPEN (CLIP) IMPLANT
CLIP APPLIE 13 LRG OPEN (CLIP) IMPLANT
DERMABOND ADVANCED .7 DNX12 (GAUZE/BANDAGES/DRESSINGS) ×1 IMPLANT
DRAPE LAPAROTOMY 100X77 ABD (DRAPES) ×1 IMPLANT
DRSG OPSITE POSTOP 4X6 (GAUZE/BANDAGES/DRESSINGS) ×1 IMPLANT
DRSG OPSITE POSTOP 4X8 (GAUZE/BANDAGES/DRESSINGS) IMPLANT
DRSG TELFA 3X4 N-ADH STERILE (GAUZE/BANDAGES/DRESSINGS) ×1 IMPLANT
DRSG TELFA 3X8 NADH STRL (GAUZE/BANDAGES/DRESSINGS) ×1 IMPLANT
ELECT BLADE 6.5 EXT (BLADE) ×1 IMPLANT
ELECT CAUTERY BLADE 6.4 (BLADE) ×1 IMPLANT
ELECTRODE REM PT RTRN 9FT ADLT (ELECTROSURGICAL) ×1 IMPLANT
GAUZE 4X4 16PLY ~~LOC~~+RFID DBL (SPONGE) ×1 IMPLANT
GAUZE SPONGE 4X4 12PLY STRL (GAUZE/BANDAGES/DRESSINGS) ×1 IMPLANT
GLOVE BIO SURGEON STRL SZ7 (GLOVE) ×2 IMPLANT
GLOVE INDICATOR 7.5 STRL GRN (GLOVE) ×1 IMPLANT
GOWN STRL REUS W/ TWL LRG LVL3 (GOWN DISPOSABLE) ×4 IMPLANT
HANDLE SUCTION POOLE (INSTRUMENTS) ×1 IMPLANT
KIT TURNOVER KIT A (KITS) ×1 IMPLANT
LABEL OR SOLS (LABEL) ×1 IMPLANT
LIGASURE IMPACT 36 18CM CVD LR (INSTRUMENTS) IMPLANT
MANIFOLD NEPTUNE II (INSTRUMENTS) ×1 IMPLANT
NDL HYPO 22X1.5 SAFETY MO (MISCELLANEOUS) ×1 IMPLANT
NDL HYPO 25X1 1.5 SAFETY (NEEDLE) ×2 IMPLANT
NEEDLE HYPO 22X1.5 SAFETY MO (MISCELLANEOUS) ×1 IMPLANT
NEEDLE HYPO 25X1 1.5 SAFETY (NEEDLE) IMPLANT
NS IRRIG 1000ML POUR BTL (IV SOLUTION) ×1 IMPLANT
NS IRRIG 500ML POUR BTL (IV SOLUTION) ×1 IMPLANT
PACK BASIN MAJOR ARMC (MISCELLANEOUS) ×1 IMPLANT
PACK BASIN MINOR ARMC (MISCELLANEOUS) ×1 IMPLANT
PACK COLON CLEAN CLOSURE (MISCELLANEOUS) IMPLANT
RELOAD STAPLE 75 3.8 BLU REG (ENDOMECHANICALS) IMPLANT
SOLUTION PREP PVP 2OZ (MISCELLANEOUS) IMPLANT
SPONGE T-LAP 18X18 ~~LOC~~+RFID (SPONGE) ×4 IMPLANT
STAPLER PROXIMATE 75MM BLUE (STAPLE) IMPLANT
STAPLER SKIN PROX 35W (STAPLE) ×1 IMPLANT
SURGILUBE 2OZ TUBE FLIPTOP (MISCELLANEOUS) IMPLANT
SUT CHROMIC 4 0 RB 1X27 (SUTURE) ×1 IMPLANT
SUT CHROMIC BR 1/2CLE 2-0 54IN (SUTURE) ×1 IMPLANT
SUT PDS AB 0 CT1 27 (SUTURE) IMPLANT
SUT PDS PLUS AB 0 CT-2 (SUTURE) ×4 IMPLANT
SUT SILK 2 0 SH (SUTURE) IMPLANT
SUT SILK 2 0SH CR/8 30 (SUTURE) ×1 IMPLANT
SUT SILK 2-0 30XBRD TIE 12 (SUTURE) IMPLANT
SUT SURGILON 0 BLK (SUTURE) ×1 IMPLANT
SUT VIC AB 2-0 SH 27XBRD (SUTURE) ×1 IMPLANT
SUT VIC AB 3-0 SH 27X BRD (SUTURE) ×1 IMPLANT
SYR 10ML LL (SYRINGE) ×2 IMPLANT
SYR 20ML LL LF (SYRINGE) ×2 IMPLANT
SYR TOOMEY 50ML (SYRINGE) IMPLANT
TRAP FLUID SMOKE EVACUATOR (MISCELLANEOUS) ×1 IMPLANT
TRAY FOLEY MTR SLVR 16FR STAT (SET/KITS/TRAYS/PACK) IMPLANT
WATER STERILE IRR 500ML POUR (IV SOLUTION) ×1 IMPLANT

## 2024-08-09 NOTE — Anesthesia Procedure Notes (Signed)
 Procedure Name: Intubation Date/Time: 08/09/2024 9:07 AM  Performed by: Ledora Duncan, CRNAPre-anesthesia Checklist: Patient identified, Emergency Drugs available, Suction available and Patient being monitored Patient Re-evaluated:Patient Re-evaluated prior to induction Oxygen Delivery Method: Circle system utilized Preoxygenation: Pre-oxygenation with 100% oxygen Induction Type: IV induction Ventilation: Mask ventilation without difficulty Laryngoscope Size: McGrath and 3 Grade View: Grade I Tube type: Oral Tube size: 7.0 mm Number of attempts: 1 Airway Equipment and Method: Stylet Placement Confirmation: ETT inserted through vocal cords under direct vision, positive ETCO2 and breath sounds checked- equal and bilateral Secured at: 21 cm Tube secured with: Tape Dental Injury: Teeth and Oropharynx as per pre-operative assessment

## 2024-08-09 NOTE — Plan of Care (Signed)

## 2024-08-09 NOTE — Op Note (Signed)
 Ileostomy takedown with bowel resection Parastomal hernia repair incarcerated  Pre-operative Diagnosis: Hx Ogilvie's  Post-operative Diagnosis: same  Procedure:  Ileostomy takedown with bowel resection  Surgeon: Laneta Luna, MD FACS  Anesthesia: Gen. with endotracheal tube  Findings: Tension Free anastomosis w/o intra op leak Good perfusion of anastomosis widely patent Parastomal hernia chronically incarcerated 3 cms  1st Assist: [employer] RNFA ( required for exposure and creation of the anastomosis)  Estimated Blood Loss: 5cc       Specimens: small bowel        Complications: none   Procedure Details  The patient was seen again in the Holding Room. The benefits, complications, treatment options, and expected outcomes were discussed with the patient. The risks of bleeding, infection, recurrence of symptoms, failure to resolve symptoms, anastomotic leak,bowel injury, any of which could require further surgery  were reviewed with the patient. The likelihood of improving the patient's symptoms with return to their baseline status is good.  The patient and/or family concurred with the proposed plan, giving informed consent.  The patient was taken to Operating Room, identified  and the procedure verified,  A Time Out was held and the above information confirmed.  Prior to the induction of general anesthesia, antibiotic prophylaxis was administered. VTE prophylaxis was in place. General endotracheal anesthesia was then administered and tolerated well. I removed the stoma appliance and placed a purse string suture on the ostomy.  After the induction, the abdomen was prepped with Chloraprep and draped in the sterile fashion. The patient was positioned in the supine position.  Elliptical incision created incorporating end ileostomy with 15 blade, Electrocautery used to divide the subcutaneous. Hernia sac was encounter, chronically incarcerated small bowel was found, we were able  to release the bowel for the hernia sac and reduced it towards the abdominal cavity.  The loop ileostomy was inspected and we selected healthy small bowel tissue proximally and distally, mesentery was divided with cautery. THe loop ileostomy was resected using 75 mm GIA stapler and side to side functional end to end anastomosis created with 75 stapler and common channel closed with silks. Mesentery defect closed with 2-0 vicryl, THe ileostomy was sent for permanent pathology.   THer anastomosis was tested for leak and was widely patent w/o obstruction or leaks.  At this point time I was able to use liposomal Marcaine  to perform an abdominal block and inciision sites.  A new tray to close was used and we changed gown and gloves. The anterior and posterior fascial defects  ( parastomal hernia) were closed with interrupted 0 PDS sutures in the standard fashion.  Dermal 2-0 vicryl placed .All the skin incisions were closed with staples  The patient was then extubated and brought to the recovery room in stable condition. Sponge, lap, and needle counts were correct at closure and at the conclusion of the case.               Laneta Luna, MD, FACS

## 2024-08-09 NOTE — Interval H&P Note (Signed)
 History and Physical Interval Note:  08/09/2024 8:54 AM  Todd Rojas  has presented today for surgery, with the diagnosis of Ileostomy in place Parastomal Hernia.  The various methods of treatment have been discussed with the patient and family. After consideration of risks, benefits and other options for treatment, the patient has consented to  Procedure(s): CLOSURE, ILEOSTOMY (N/A) REPAIR, HERNIA, PARASTOMAL (N/A) as a surgical intervention.  The patient's history has been reviewed, patient examined, no change in status, stable for surgery.  I have reviewed the patient's chart and labs.  Questions were answered to the patient's satisfaction.     Pepe Mineau F Evelen Vazguez

## 2024-08-09 NOTE — Anesthesia Preprocedure Evaluation (Addendum)
 Anesthesia Evaluation  Patient identified by MRN, date of birth, ID band Patient awake    Reviewed: Allergy & Precautions, H&P , NPO status , Patient's Chart, lab work & pertinent test results, reviewed documented beta blocker date and time   Airway Mallampati: II  TM Distance: >3 FB Neck ROM: full    Dental  (+) Teeth Intact   Pulmonary shortness of breath, asthma , COPD,  COPD inhaler, former smoker   Pulmonary exam normal        Cardiovascular Exercise Tolerance: Poor hypertension, On Medications + CAD, + Past MI and +CHF  Normal cardiovascular exam+ dysrhythmias  Rhythm:regular Rate:Normal     Neuro/Psych negative neurological ROS  negative psych ROS   GI/Hepatic Neg liver ROS,GERD  Medicated,,  Endo/Other  negative endocrine ROS    Renal/GU Renal InsufficiencyRenal disease  negative genitourinary   Musculoskeletal   Abdominal Normal abdominal exam  (+)   Peds  Hematology negative hematology ROS (+)   Anesthesia Other Findings Past Medical History: No date: Actinic keratosis No date: Arthritis No date: Asthma 03/17/2020: Basal cell carcinoma     Comment:  right ant lat deltoid 02/13/2012: CAD (coronary artery disease)     Comment:  Overview:   12/2004 - Anginal equivalent symptoms:  Left              arm pain, weakness, and shortness of breath.   12/2004:                Anterior STEMI.  Transferred to [facility].   01/06/2005 -               Cardiac cath:  EF 51%, an occluded mid LAD and a large               OM2 with 75% ostial stenosis.  PCI of the mid occluded               LAD using a Cypher stent.   03/2005 - 2D echo:  EF >55%,               LAE 4.7 cm, no significant valvular disease.   02/2008:                Plavix stopped.   06/2008:  Recurrent angina - symptoms               of chest tightness.  Presented to [facility].  Cardiac catheterization.  The patient                stated nonobstructive CAD seen.  Plavix restarted at that              time.   03/2009 - 2D echo:  EF 35%, akinetic apex and               septum.  Stress echocardiogram deferred for cardiac               catheterization.   03/30/2009:  PCI 90% proximal LAD with              a 3.0 x 18 mm Xience DES.   02/2010: Slow and gradual               return of chest tightness, fatigue and generalized               weakness concerning for return of  anginal equivalent.                Stress test did not show ischemia to the heart muscle               during exercise.  Evidenc No date: CHF (congestive heart failure) (HCC) No date: Chronic kidney disease     Comment:  kidney stone left side 07/03/2017: COPD (chronic obstructive pulmonary disease) (HCC) 02/13/2012: Dyslipidemia     Comment:  Overview:   03/2009  TC 158, TG 88, HDL 39, LDL 102.                11/2013  TC 148, TG 115, HDL 44.3, LDL 80.7 (done at               PCP's office)   10/2014 TC 146  11/2015 TC 123, TG 86,               HDL 41.7, LDL 64  11/2016 TC 140, TG 129, HDL 40.1, LDL               74  No date: Dyspnea No date: GERD (gastroesophageal reflux disease) 03/09/2013: GI disease     Comment:  Overview:   Dysphagia - EGD demonstrated esophageal               stricture which was dilated   GERD   Hemorrhoids  03/09/2013: Hearing loss No date: History of kidney stones 07/03/2017: Hyperlipidemia, unspecified 02/13/2012: Hypertension 2004: Myocardial infarction (HCC) 12/09/2016: Personal history of gout 05/05/2015: Primary osteoarthritis of both knees 04/14/2016: Right-sided low back pain without sciatica 06/03/2020: Squamous cell carcinoma of skin     Comment:  R ear - ED&C Past Surgical History: No date: colonoscopy with polypectomy 10/08/2018: COLONOSCOPY WITH PROPOFOL ; N/A     Comment:  Procedure: COLONOSCOPY WITH PROPOFOL ;  Surgeon: Viktoria Lamar DASEN, MD;  Location: [facility] ENDOSCOPY;  Service:                Endoscopy;  Laterality: N/A; 01/06/2023: COLONOSCOPY WITH PROPOFOL ; N/A     Comment:  Procedure: COLONOSCOPY WITH PROPOFOL ;  Surgeon:               Maryruth Ole DASEN, MD;  Location: [facility] ENDOSCOPY;                Service: Endoscopy;  Laterality: N/A; No date: coronary stents 08/09/2017: CYSTOSCOPY WITH STENT PLACEMENT; Left     Comment:  Procedure: CYSTOSCOPY WITH STENT PLACEMENT;  Surgeon:               Chauncey Redell Agent, MD;  Location: [facility] ORS;  Service:               Urology;  Laterality: Left; 03/17/2024: FLEXIBLE SIGMOIDOSCOPY; N/A     Comment:  Procedure: SIGMOIDOSCOPY, FLEXIBLE;  Surgeon: [place],               Ladell POUR, MD;  Location: [facility] ENDOSCOPY;  Service:               Gastroenterology;  Laterality: N/A; 2016: MEDIAL PARTIAL KNEE REPLACEMENT; Bilateral 08/09/2017: URETEROSCOPY WITH HOLMIUM LASER LITHOTRIPSY; Left     Comment:  Procedure: URETEROSCOPY WITH HOLMIUM LASER               LITHOTRIPSY/INCISION OF URETEROCELE;  Surgeon: Chauncey,  Redell Agent, MD;  Location: [facility] ORS;  Service: Urology;               Laterality: Left; BMI    Body Mass Index: 30.54 kg/m     Reproductive/Obstetrics negative OB ROS                              Anesthesia Physical Anesthesia Plan  ASA: 3  Anesthesia Plan: General ETT   Post-op Pain Management: Ofirmev  IV (intra-op)*   Induction:   PONV Risk Score and Plan: 3 and Dexamethasone  and Ondansetron   Airway Management Planned: Oral ETT  Additional Equipment:   Intra-op Plan:   Post-operative Plan: Extubation in OR  Informed Consent: I have reviewed the patients History and Physical, chart, labs and discussed the procedure including the risks, benefits and alternatives for the proposed anesthesia with the patient or authorized representative who has indicated his/her understanding and acceptance.     Dental Advisory Given  Plan Discussed with: CRNA  Anesthesia Plan Comments:          Anesthesia Quick Evaluation

## 2024-08-09 NOTE — TOC CM/SW Note (Signed)
 Transition of Care [facility]) - Inpatient Brief Assessment   Patient Details  Name: Todd Rojas MRN: [id number] Date of Birth: 26-Nov-1941  Transition of Care [facility]) CM/SW Contact:    Corean ONEIDA Haddock, RN Phone Number: 08/09/2024, 4:12 PM   Clinical Narrative:   Transition of Care [facility]) Screening Note   Patient Details  Name: Todd Rojas Date of Birth: October 10, 1941   Transition of Care [facility]) CM/SW Contact:    Corean ONEIDA Haddock, RN Phone Number: 08/09/2024, 4:12 PM    Transition of Care Department [facility]) has reviewed patient and no TOC needs have been identified at this time. . If new patient transition needs arise, please place a TOC consult.    Transition of Care Asessment: Insurance and Status: Insurance coverage has been reviewed Patient has primary care physician: Yes     Prior/Current Home Services: No current home services Social Drivers of Health Review: SDOH reviewed no interventions necessary Readmission risk has been reviewed: Yes Transition of care needs: no transition of care needs at this time

## 2024-08-09 NOTE — Transfer of Care (Signed)
 Immediate Anesthesia Transfer of Care Note  Patient: Todd Rojas  Procedure(s) Performed: CLOSURE, ILEOSTOMY REPAIR, HERNIA, PARASTOMAL  Patient Location: PACU  Anesthesia Type:General  Level of Consciousness: awake, drowsy, and patient cooperative  Airway & Oxygen Therapy: Patient Spontanous Breathing and Patient connected to face mask oxygen  Post-op Assessment: Report given to RN and Post -op Vital signs reviewed and stable  Post vital signs: Reviewed and stable  Last Vitals:  Vitals Value Taken Time  BP 122/58 08/09/24 11:00  Temp 36 C 08/09/24 11:00  Pulse 65 08/09/24 11:06  Resp 18 08/09/24 11:06  SpO2 99 % 08/09/24 11:06  Vitals shown include unfiled device data.  Last Pain:  Vitals:   08/09/24 1100  TempSrc:   PainSc: 0-No pain         Complications: No notable events documented.

## 2024-08-10 ENCOUNTER — Encounter: Payer: Self-pay | Admitting: Surgery

## 2024-08-10 LAB — CBC
HCT: 31.3 % — ABNORMAL LOW (ref 39.0–52.0)
Hemoglobin: 10.5 g/dL — ABNORMAL LOW (ref 13.0–17.0)
MCH: 33.3 pg (ref 26.0–34.0)
MCHC: 33.5 g/dL (ref 30.0–36.0)
MCV: 99.4 fL (ref 80.0–100.0)
Platelets: 178 K/uL (ref 150–400)
RBC: 3.15 MIL/uL — ABNORMAL LOW (ref 4.22–5.81)
RDW: 14.4 % (ref 11.5–15.5)
WBC: 12.5 K/uL — ABNORMAL HIGH (ref 4.0–10.5)
nRBC: 0 % (ref 0.0–0.2)

## 2024-08-10 LAB — BASIC METABOLIC PANEL WITH GFR
Anion gap: 4 — ABNORMAL LOW (ref 5–15)
BUN: 20 mg/dL (ref 8–23)
CO2: 23 mmol/L (ref 22–32)
Calcium: 8.5 mg/dL — ABNORMAL LOW (ref 8.9–10.3)
Chloride: 110 mmol/L (ref 98–111)
Creatinine, Ser: 1.26 mg/dL — ABNORMAL HIGH (ref 0.61–1.24)
GFR, Estimated: 57 mL/min — ABNORMAL LOW (ref >60)
Glucose, Bld: 143 mg/dL — ABNORMAL HIGH (ref 70–99)
Potassium: 4.6 mmol/L (ref 3.5–5.1)
Sodium: 137 mmol/L (ref 135–145)

## 2024-08-10 MED ORDER — KETOROLAC TROMETHAMINE 15 MG/ML IJ SOLN
15.0000 mg | Freq: Four times a day (QID) | INTRAMUSCULAR | Status: DC
  Administered 2024-08-11 – 2024-08-12 (×3): 15 mg via INTRAVENOUS
  Filled 2024-08-10 (×4): qty 1

## 2024-08-10 MED ORDER — APIXABAN 2.5 MG PO TABS
2.5000 mg | ORAL_TABLET | Freq: Two times a day (BID) | ORAL | Status: DC
  Administered 2024-08-10 – 2024-08-11 (×2): 2.5 mg via ORAL
  Filled 2024-08-10 (×2): qty 1

## 2024-08-10 NOTE — Progress Notes (Signed)
 Per patient, had bowel movement.

## 2024-08-10 NOTE — Progress Notes (Signed)
 Patient refusing scheduled toradol . Stated his pain is being managed with the scheduled tylenol .

## 2024-08-10 NOTE — Progress Notes (Signed)
 CC: POD #1 Subjective: Doing ok, no flatus Voiding Pain controlled  Objective: Vital signs in last 24 hours: Temp:  [97.2 F (36.2 C)-98 F (36.7 C)] 97.7 F (36.5 C) (08/23 1143) Pulse Rate:  [50-75] 51 (08/23 1143) Resp:  [14-22] 18 (08/23 1143) BP: (104-115)/(56-68) 109/56 (08/23 1143) SpO2:  [97 %-99 %] 98 % (08/23 1143)    Intake/Output from previous day: 08/22 0701 - 08/23 0700 In: 1300 [I.V.:700; IV Piggyback:600] Out: 300 [Urine:300] Intake/Output this shift: Total I/O In: 2067 [P.O.:600; I.V.:1467] Out: 960 [Urine:960]  Physical exam:  NAD alert Abd: soft, decrease bs, incision healing well w/o infection   Lab Results: CBC  Recent Labs    08/10/24 0455  WBC 12.5*  HGB 10.5*  HCT 31.3*  PLT 178   BMET Recent Labs    08/08/24 1417 08/10/24 0455  NA 141 137  K 4.2 4.6  CL 106 110  CO2 23 23  GLUCOSE 150* 143*  BUN 25* 20  CREATININE 1.85* 1.26*  CALCIUM  9.5 8.5*   PT/INR No results for input(s): LABPROT, INR in the last 72 hours. ABG No results for input(s): PHART, HCO3 in the last 72 hours.  Invalid input(s): PCO2, PO2  Studies/Results: No results found.  Anti-infectives: Anti-infectives (From admission, onward)    Start     Dose/Rate Route Frequency Ordered Stop   08/10/24 0600  cefoTEtan  (CEFOTAN ) 2 g in sodium chloride  0.9 % 100 mL IVPB        2 g 200 mL/hr over 30 Minutes Intravenous On call to O.R. 08/09/24 1707 08/11/24 0559   08/09/24 0745  cefoTEtan  (CEFOTAN ) 2 g in sodium chloride  0.9 % 100 mL IVPB        2 g 200 mL/hr over 30 Minutes Intravenous On call to O.R. 08/09/24 0733 08/09/24 0931       Assessment/Plan: Doing well No bowel fx Continue entereg  May start eliquis  Mobilize Pulm toilet  Laneta Luna, MD, [facility]  08/10/2024

## 2024-08-10 NOTE — Plan of Care (Signed)

## 2024-08-11 MED ORDER — APIXABAN 5 MG PO TABS
5.0000 mg | ORAL_TABLET | Freq: Two times a day (BID) | ORAL | Status: DC
  Administered 2024-08-11: 5 mg via ORAL
  Filled 2024-08-11: qty 1

## 2024-08-11 NOTE — Progress Notes (Signed)
 CC: POD # 2 Subjective: Doing well + bm  Objective: Vital signs in last 24 hours: Temp:  [97.6 F (36.4 C)-97.9 F (36.6 C)] 97.9 F (36.6 C) (08/24 1104) Pulse Rate:  [52-57] 52 (08/24 1104) Resp:  [16-20] 16 (08/24 1104) BP: (107-115)/(56-65) 107/56 (08/24 1104) SpO2:  [96 %-98 %] 96 % (08/24 1104) Last BM Date : 08/11/24  Intake/Output from previous day: 08/23 0701 - 08/24 0700 In: 2927 [P.O.:1460; I.V.:1467] Out: 1560 [Urine:1560] Intake/Output this shift: Total I/O In: 720 [P.O.:720] Out: -   Physical exam:  NAD alert Abd: soft, decrease bs, incision healing well w/o infection    Lab Results: CBC  Recent Labs    08/10/24 0455  WBC 12.5*  HGB 10.5*  HCT 31.3*  PLT 178   BMET Recent Labs    08/10/24 0455  NA 137  K 4.6  CL 110  CO2 23  GLUCOSE 143*  BUN 20  CREATININE 1.26*  CALCIUM  8.5*   PT/INR No results for input(s): LABPROT, INR in the last 72 hours. ABG No results for input(s): PHART, HCO3 in the last 72 hours.  Invalid input(s): PCO2, PO2  Studies/Results: No results found.  Anti-infectives: Anti-infectives (From admission, onward)    Start     Dose/Rate Route Frequency Ordered Stop   08/10/24 0600  cefoTEtan  (CEFOTAN ) 2 g in sodium chloride  0.9 % 100 mL IVPB        2 g 200 mL/hr over 30 Minutes Intravenous On call to O.R. 08/09/24 1707 08/11/24 0559   08/09/24 0745  cefoTEtan  (CEFOTAN ) 2 g in sodium chloride  0.9 % 100 mL IVPB        2 g 200 mL/hr over 30 Minutes Intravenous On call to O.R. 08/09/24 0733 08/09/24 0931       Assessment/Plan:  Doing well Advance diet DC in am Mobilize Pulm toilet     Laneta Luna, MD, FACS  08/11/2024

## 2024-08-11 NOTE — Plan of Care (Signed)
  Problem: Education: Goal: Knowledge of General Education information will improve Description: Including pain rating scale, medication(s)/side effects and non-pharmacologic comfort measures Outcome: Progressing   Problem: Health Behavior/Discharge Planning: Goal: Ability to manage health-related needs will improve Outcome: Progressing   Problem: Clinical Measurements: Goal: Will remain free from infection Outcome: Progressing   Problem: Activity: Goal: Risk for activity intolerance will decrease Outcome: Progressing   Problem: Nutrition: Goal: Adequate nutrition will be maintained Outcome: Progressing   Problem: Pain Managment: Goal: General experience of comfort will improve and/or be controlled Outcome: Progressing   Problem: Safety: Goal: Ability to remain free from injury will improve Outcome: Progressing   Problem: Skin Integrity: Goal: Risk for impaired skin integrity will decrease Outcome: Progressing

## 2024-08-11 NOTE — Plan of Care (Signed)

## 2024-08-12 ENCOUNTER — Encounter: Payer: Self-pay | Admitting: Surgery

## 2024-08-12 LAB — CBC
HCT: 29.9 % — ABNORMAL LOW (ref 39.0–52.0)
Hemoglobin: 9.3 g/dL — ABNORMAL LOW (ref 13.0–17.0)
MCH: 32.3 pg (ref 26.0–34.0)
MCHC: 31.1 g/dL (ref 30.0–36.0)
MCV: 103.8 fL — ABNORMAL HIGH (ref 80.0–100.0)
Platelets: 161 K/uL (ref 150–400)
RBC: 2.88 MIL/uL — ABNORMAL LOW (ref 4.22–5.81)
RDW: 15.1 % (ref 11.5–15.5)
WBC: 8.2 K/uL (ref 4.0–10.5)
nRBC: 0 % (ref 0.0–0.2)

## 2024-08-12 LAB — BASIC METABOLIC PANEL WITH GFR
Anion gap: 5 (ref 5–15)
BUN: 24 mg/dL — ABNORMAL HIGH (ref 8–23)
CO2: 25 mmol/L (ref 22–32)
Calcium: 8.4 mg/dL — ABNORMAL LOW (ref 8.9–10.3)
Chloride: 110 mmol/L (ref 98–111)
Creatinine, Ser: 1.69 mg/dL — ABNORMAL HIGH (ref 0.61–1.24)
GFR, Estimated: 40 mL/min — ABNORMAL LOW (ref >60)
Glucose, Bld: 95 mg/dL (ref 70–99)
Potassium: 3.9 mmol/L (ref 3.5–5.1)
Sodium: 140 mmol/L (ref 135–145)

## 2024-08-12 LAB — SURGICAL PATHOLOGY

## 2024-08-12 MED ORDER — OXYCODONE HCL 5 MG PO TABS: 5.0000 mg | ORAL_TABLET | ORAL | 0 refills | Status: DC | PRN

## 2024-08-12 MED ORDER — ALBUMIN HUMAN 25 % IV SOLN
25.0000 g | Freq: Once | INTRAVENOUS | Status: DC
  Filled 2024-08-12: qty 100

## 2024-08-12 NOTE — Discharge Instructions (Signed)
 In addition to included general post-operative instructions,  Diet: Resume home diet.   Activity: No heavy lifting >20 pounds (children, pets, laundry, garbage) or strenuous activity for 6 weeks, but light activity and walking are encouraged. Do not drive or drink alcohol  if taking narcotic pain medications or having pain that might distract from driving.  Wound care: You may shower/get incision wet with soapy water and pat dry (do not rub incisions), but no baths or submerging incision underwater until follow-up. We will remove staples at follow up on 09/03 at 2:00 PM  Medications: Resume all home medications. For mild to moderate pain: acetaminophen  (Tylenol ) or ibuprofen /naproxen (if no kidney disease). Combining Tylenol  with alcohol  can substantially increase your risk of causing liver disease. Narcotic pain medications, if prescribed, can be used for severe pain, though may cause nausea, constipation, and drowsiness. Do not combine Tylenol  and Percocet (or similar) within a 6 hour period as Percocet (and similar) contain(s) Tylenol . If you do not need the narcotic pain medication, you do not need to fill the prescription.  Call office [phone] / [phone]) at any time if any questions, worsening pain, fevers/chills, bleeding, drainage from incision site, or other concerns.

## 2024-08-12 NOTE — Discharge Summary (Signed)
 [facility] SURGICAL DISCHARGE SUMMARY  Patient ID: Todd Rojas MRN: [id number] DOB/AGE: Feb 11, 1941 83 y.o.  Admit date: 08/09/2024 Discharge date: 08/12/2024  Discharge Diagnoses Patient Active Problem List   Diagnosis Date Noted   Parastomal hernia without obstruction or gangrene 08/09/2024   S/P closure of ileostomy 08/09/2024    Consultants None  Procedures 08/09/2024:  Ileostomy Takedown  Repair of Parastomal Hernia   HPI: Todd Rojas is a 83 y.o. 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   Hospital Course: Informed consent was obtained and documented, and patient underwent uneventful ileostomy reversal and parastomal hernia repair (Dr Jordis, 08/09/2024).  Post-operatively, patient patient did well with ROBF. Advancement of patient's diet and ambulation were well-tolerated. The remainder of patient's hospital course was essentially unremarkable, and discharge planning was initiated accordingly with patient safely able to be discharged home with appropriate discharge instructions, pain control, and outpatient follow-up after all of his questions were answered to his expressed satisfaction.   Discharge Condition: Good   Physical Examination:  Constitutional: Well appearing male, NAD Pulmonary: Normal effort, no respiratory distress Gastrointestinal: Soft, non-tender, non-distended, no rebound/guarding Skin: Ileostomy takedown site in RLQ with staples in place, no erythema or drainage    Allergies as of 08/12/2024   No Known Allergies      Medication List     TAKE these medications    acetaminophen  500 MG tablet Commonly known as: TYLENOL  Take 1,000 mg by mouth every 6 (six) hours as needed (pain.).   albuterol  108 (90 Base) MCG/ACT inhaler Commonly known as: VENTOLIN  HFA Inhale 1 puff into the lungs every 6 (six) hours as needed for wheezing or  shortness of breath.   allopurinol  300 MG tablet Commonly known as: ZYLOPRIM  Take 300 mg by mouth in the morning.   budesonide -formoterol  160-4.5 MCG/ACT inhaler Commonly known as: SYMBICORT  Inhale 2 puffs into the lungs 2 (two) times daily.   colchicine 0.6 MG tablet Take 0.6 mg by mouth daily as needed (gout flares.).   cyanocobalamin  1000 MCG tablet Commonly known as: VITAMIN B12 Take 1,000 mcg by mouth in the morning.   diclofenac  Sodium 1 % Gel Commonly known as: VOLTAREN  Apply 1 Application topically 4 (four) times daily as needed (pain.).   Eliquis  5 MG Tabs tablet Generic drug: apixaban  Take 2 tablets (10 mg total) by mouth 2 (two) times daily for 4 days, THEN 1 tablet (5 mg total) 2 (two) times daily for 26 days. Okay to resume at home   furosemide  20 MG tablet Commonly known as: LASIX  Take 20 mg by mouth daily as needed (fluid retention.).   nitroGLYCERIN  0.4 MG SL tablet Commonly known as: NITROSTAT  Place 0.4 mg under the tongue every 5 (five) minutes x 3 doses as needed for chest pain.   ondansetron  4 MG disintegrating tablet Commonly known as: ZOFRAN -ODT Take 1 tablet (4 mg total) by mouth every 8 (eight) hours as needed for nausea or vomiting.   oxyCODONE  5 MG immediate release tablet Commonly known as: Oxy IR/ROXICODONE  Take 1-2 tablets (5-10 mg total) by mouth every 4 (four) hours as needed for moderate pain (pain score 4-6).   PROBIOTIC PO Take 1 capsule by mouth in the morning.   rosuvastatin  20 MG tablet Commonly known as: CRESTOR  Take 20 mg by mouth in the morning.          Follow-up Information     Samira Acero R, PA-C.  Go on 08/21/2024.   Specialty: Physician Assistant Why: Go to appointment on 09/03 at 200 PM Contact information: [address] [place] [address] [phone]                  Time spent on discharge management including discussion of hospital course, clinical condition, outpatient  instructions, prescriptions, and follow up with the patient and members of the medical team: >30 minutes  -- Arthea Platt , PA-C [facility]  08/12/2024, 9:02 AM [phone] M-F: 7am - 4pm

## 2024-08-13 ENCOUNTER — Ambulatory Visit: Admitting: Urology

## 2024-08-13 NOTE — Anesthesia Postprocedure Evaluation (Signed)
 Anesthesia Post Note  Patient: Todd Rojas  Procedure(s) Performed: CLOSURE, ILEOSTOMY REPAIR, HERNIA, PARASTOMAL  Patient location during evaluation: PACU Anesthesia Type: General Level of consciousness: awake and alert Pain management: pain level controlled Vital Signs Assessment: post-procedure vital signs reviewed and stable Respiratory status: spontaneous breathing, nonlabored ventilation and respiratory function stable Cardiovascular status: blood pressure returned to baseline and stable Postop Assessment: no apparent nausea or vomiting Anesthetic complications: no   No notable events documented.   Last Vitals:  Vitals:   08/12/24 0505 08/12/24 0801  BP: (!) 106/57 113/70  Pulse: (!) 59 (!) 53  Resp: 17 18  Temp: 36.8 C 36.6 C  SpO2: (!) 87% 97%    Last Pain:  Vitals:   08/12/24 0801  TempSrc: Oral  PainSc:                  [name]

## 2024-08-14 ENCOUNTER — Other Ambulatory Visit: Payer: Self-pay

## 2024-08-14 ENCOUNTER — Inpatient Hospital Stay

## 2024-08-14 ENCOUNTER — Emergency Department

## 2024-08-14 ENCOUNTER — Inpatient Hospital Stay
Admission: EM | Admit: 2024-08-14 | Discharge: 2024-08-18 | DRG: 372 | Disposition: A | Discharge status: Home / Self Care | Attending: Surgery | Admitting: Surgery

## 2024-08-14 DIAGNOSIS — Z955 Presence of coronary angioplasty implant and graft: Secondary | ICD-10-CM

## 2024-08-14 DIAGNOSIS — Z5971 Insufficient health insurance coverage: Secondary | ICD-10-CM

## 2024-08-14 DIAGNOSIS — N179 Acute kidney failure, unspecified: Secondary | ICD-10-CM | POA: Diagnosis not present

## 2024-08-14 DIAGNOSIS — H9190 Unspecified hearing loss, unspecified ear: Secondary | ICD-10-CM | POA: Diagnosis present

## 2024-08-14 DIAGNOSIS — N400 Enlarged prostate without lower urinary tract symptoms: Secondary | ICD-10-CM | POA: Diagnosis present

## 2024-08-14 DIAGNOSIS — R509 Fever, unspecified: Secondary | ICD-10-CM | POA: Diagnosis present

## 2024-08-14 DIAGNOSIS — M109 Gout, unspecified: Secondary | ICD-10-CM | POA: Diagnosis present

## 2024-08-14 DIAGNOSIS — E785 Hyperlipidemia, unspecified: Secondary | ICD-10-CM | POA: Diagnosis present

## 2024-08-14 DIAGNOSIS — I130 Hypertensive heart and chronic kidney disease with heart failure and stage 1 through stage 4 chronic kidney disease, or unspecified chronic kidney disease: Secondary | ICD-10-CM | POA: Diagnosis present

## 2024-08-14 DIAGNOSIS — Z96653 Presence of artificial knee joint, bilateral: Secondary | ICD-10-CM | POA: Diagnosis present

## 2024-08-14 DIAGNOSIS — J4489 Other specified chronic obstructive pulmonary disease: Secondary | ICD-10-CM | POA: Diagnosis present

## 2024-08-14 DIAGNOSIS — Z860101 Personal history of adenomatous and serrated colon polyps: Secondary | ICD-10-CM

## 2024-08-14 DIAGNOSIS — M1990 Unspecified osteoarthritis, unspecified site: Secondary | ICD-10-CM | POA: Diagnosis present

## 2024-08-14 DIAGNOSIS — A0472 Enterocolitis due to Clostridium difficile, not specified as recurrent: Principal | ICD-10-CM | POA: Diagnosis present

## 2024-08-14 DIAGNOSIS — R5082 Postprocedural fever: Secondary | ICD-10-CM | POA: Diagnosis present

## 2024-08-14 DIAGNOSIS — I700 Atherosclerosis of aorta: Secondary | ICD-10-CM | POA: Diagnosis present

## 2024-08-14 DIAGNOSIS — T81320A Disruption or dehiscence of gastrointestinal tract anastomosis, repair, or closure, initial encounter: Secondary | ICD-10-CM

## 2024-08-14 DIAGNOSIS — Z7901 Long term (current) use of anticoagulants: Secondary | ICD-10-CM | POA: Diagnosis not present

## 2024-08-14 DIAGNOSIS — Z806 Family history of leukemia: Secondary | ICD-10-CM

## 2024-08-14 DIAGNOSIS — M5135 Other intervertebral disc degeneration, thoracolumbar region: Secondary | ICD-10-CM | POA: Diagnosis present

## 2024-08-14 DIAGNOSIS — Z86711 Personal history of pulmonary embolism: Secondary | ICD-10-CM

## 2024-08-14 DIAGNOSIS — N1831 Chronic kidney disease, stage 3a: Secondary | ICD-10-CM | POA: Diagnosis present

## 2024-08-14 DIAGNOSIS — Z87891 Personal history of nicotine dependence: Secondary | ICD-10-CM

## 2024-08-14 DIAGNOSIS — E1122 Type 2 diabetes mellitus with diabetic chronic kidney disease: Secondary | ICD-10-CM | POA: Diagnosis present

## 2024-08-14 DIAGNOSIS — Z7951 Long term (current) use of inhaled steroids: Secondary | ICD-10-CM | POA: Diagnosis not present

## 2024-08-14 DIAGNOSIS — Z85828 Personal history of other malignant neoplasm of skin: Secondary | ICD-10-CM

## 2024-08-14 DIAGNOSIS — I5022 Chronic systolic (congestive) heart failure: Secondary | ICD-10-CM | POA: Diagnosis present

## 2024-08-14 DIAGNOSIS — I252 Old myocardial infarction: Secondary | ICD-10-CM | POA: Diagnosis not present

## 2024-08-14 DIAGNOSIS — I428 Other cardiomyopathies: Secondary | ICD-10-CM | POA: Diagnosis present

## 2024-08-14 DIAGNOSIS — R1084 Generalized abdominal pain: Principal | ICD-10-CM

## 2024-08-14 DIAGNOSIS — I2510 Atherosclerotic heart disease of native coronary artery without angina pectoris: Secondary | ICD-10-CM | POA: Diagnosis present

## 2024-08-14 DIAGNOSIS — Z87442 Personal history of urinary calculi: Secondary | ICD-10-CM

## 2024-08-14 DIAGNOSIS — Z820 Family history of epilepsy and other diseases of the nervous system: Secondary | ICD-10-CM

## 2024-08-14 LAB — CBC WITH DIFFERENTIAL/PLATELET
Abs Immature Granulocytes: 0.04 K/uL (ref 0.00–0.07)
Basophils Absolute: 0 K/uL (ref 0.0–0.1)
Basophils Relative: 0 %
Eosinophils Absolute: 0.2 K/uL (ref 0.0–0.5)
Eosinophils Relative: 2 %
HCT: 34.3 % — ABNORMAL LOW (ref 39.0–52.0)
Hemoglobin: 10.8 g/dL — ABNORMAL LOW (ref 13.0–17.0)
Immature Granulocytes: 0 %
Lymphocytes Relative: 7 %
Lymphs Abs: 0.8 K/uL (ref 0.7–4.0)
MCH: 32.2 pg (ref 26.0–34.0)
MCHC: 31.5 g/dL (ref 30.0–36.0)
MCV: 102.4 fL — ABNORMAL HIGH (ref 80.0–100.0)
Monocytes Absolute: 0.7 K/uL (ref 0.1–1.0)
Monocytes Relative: 6 %
Neutro Abs: 9.4 K/uL — ABNORMAL HIGH (ref 1.7–7.7)
Neutrophils Relative %: 85 %
Platelets: 184 K/uL (ref 150–400)
RBC: 3.35 MIL/uL — ABNORMAL LOW (ref 4.22–5.81)
RDW: 15 % (ref 11.5–15.5)
WBC: 11.1 K/uL — ABNORMAL HIGH (ref 4.0–10.5)
nRBC: 0 % (ref 0.0–0.2)

## 2024-08-14 LAB — COMPREHENSIVE METABOLIC PANEL WITH GFR
ALT: 13 U/L (ref 0–44)
AST: 13 U/L — ABNORMAL LOW (ref 15–41)
Albumin: 3.5 g/dL (ref 3.5–5.0)
Alkaline Phosphatase: 40 U/L (ref 38–126)
Anion gap: 14 (ref 5–15)
BUN: 20 mg/dL (ref 8–23)
CO2: 24 mmol/L (ref 22–32)
Calcium: 8.8 mg/dL — ABNORMAL LOW (ref 8.9–10.3)
Chloride: 104 mmol/L (ref 98–111)
Creatinine, Ser: 1.34 mg/dL — ABNORMAL HIGH (ref 0.61–1.24)
GFR, Estimated: 53 mL/min — ABNORMAL LOW (ref >60)
Glucose, Bld: 118 mg/dL — ABNORMAL HIGH (ref 70–99)
Potassium: 3.8 mmol/L (ref 3.5–5.1)
Sodium: 142 mmol/L (ref 135–145)
Total Bilirubin: 0.8 mg/dL (ref 0.0–1.2)
Total Protein: 6.2 g/dL — ABNORMAL LOW (ref 6.5–8.1)

## 2024-08-14 LAB — LACTIC ACID, PLASMA
Lactic Acid, Venous: 1.2 mmol/L (ref 0.5–1.9)
Lactic Acid, Venous: 1.5 mmol/L (ref 0.5–1.9)

## 2024-08-14 LAB — APTT: aPTT: 35 s (ref 24–36)

## 2024-08-14 LAB — HEPARIN LEVEL (UNFRACTIONATED): Heparin Unfractionated: >1.1 [IU]/mL — ABNORMAL HIGH (ref 0.30–0.70)

## 2024-08-14 MED ORDER — ACETAMINOPHEN 500 MG PO TABS
1000.0000 mg | ORAL_TABLET | Freq: Four times a day (QID) | ORAL | Status: DC
  Administered 2024-08-14 – 2024-08-18 (×14): 1000 mg via ORAL
  Filled 2024-08-14 (×14): qty 2

## 2024-08-14 MED ORDER — HYDRALAZINE HCL 20 MG/ML IJ SOLN: 10.0000 mg | INTRAMUSCULAR | Status: DC | PRN

## 2024-08-14 MED ORDER — LACTATED RINGERS IV BOLUS
1000.0000 mL | Freq: Once | INTRAVENOUS | Status: AC
Start: 2024-08-14 — End: 2024-08-14
  Administered 2024-08-14: 1000 mL via INTRAVENOUS

## 2024-08-14 MED ORDER — ONDANSETRON HCL 4 MG/2ML IJ SOLN
4.0000 mg | Freq: Once | INTRAMUSCULAR | Status: AC
  Administered 2024-08-14: 4 mg via INTRAVENOUS
  Filled 2024-08-14: qty 2

## 2024-08-14 MED ORDER — PANTOPRAZOLE SODIUM 40 MG IV SOLR
40.0000 mg | Freq: Two times a day (BID) | INTRAVENOUS | Status: DC
  Administered 2024-08-14 – 2024-08-18 (×8): 40 mg via INTRAVENOUS
  Filled 2024-08-14 (×9): qty 10

## 2024-08-14 MED ORDER — HEPARIN BOLUS VIA INFUSION: 4000.0000 [IU] | Freq: Once | INTRAVENOUS | Status: DC

## 2024-08-14 MED ORDER — PIPERACILLIN-TAZOBACTAM 3.375 G IVPB
3.3750 g | Freq: Three times a day (TID) | INTRAVENOUS | Status: DC
  Administered 2024-08-15 (×2): 3.375 g via INTRAVENOUS
  Filled 2024-08-14 (×2): qty 50

## 2024-08-14 MED ORDER — LACTATED RINGERS IV BOLUS
1000.0000 mL | Freq: Once | INTRAVENOUS | Status: AC
  Administered 2024-08-14: 1000 mL via INTRAVENOUS

## 2024-08-14 MED ORDER — SODIUM CHLORIDE 0.9 % IV SOLN: INTRAVENOUS | Status: AC

## 2024-08-14 MED ORDER — IOHEXOL 300 MG/ML  SOLN
100.0000 mL | Freq: Once | INTRAMUSCULAR | Status: AC | PRN
  Administered 2024-08-14: 100 mL via INTRAVENOUS

## 2024-08-14 MED ORDER — DIPHENHYDRAMINE HCL 12.5 MG/5ML PO ELIX: 12.5000 mg | ORAL_SOLUTION | Freq: Four times a day (QID) | ORAL | Status: DC | PRN

## 2024-08-14 MED ORDER — DIPHENHYDRAMINE HCL 50 MG/ML IJ SOLN: 12.5000 mg | Freq: Four times a day (QID) | INTRAMUSCULAR | Status: DC | PRN

## 2024-08-14 MED ORDER — ONDANSETRON 4 MG PO TBDP
4.0000 mg | ORAL_TABLET | Freq: Four times a day (QID) | ORAL | Status: DC | PRN
Start: 2024-08-14 — End: 2024-08-18

## 2024-08-14 MED ORDER — PIPERACILLIN-TAZOBACTAM 4.5 G IVPB
4.5000 g | Freq: Once | INTRAVENOUS | Status: AC
  Administered 2024-08-14: 4.5 g via INTRAVENOUS
  Filled 2024-08-14: qty 100

## 2024-08-14 MED ORDER — HEPARIN (PORCINE) 25000 UT/250ML-% IV SOLN
1250.0000 [IU]/h | INTRAVENOUS | Status: DC
  Administered 2024-08-14: 1150 [IU]/h via INTRAVENOUS
  Administered 2024-08-15: 1250 [IU]/h via INTRAVENOUS
  Filled 2024-08-14 (×2): qty 250

## 2024-08-14 MED ORDER — FAMOTIDINE IN NACL 20-0.9 MG/50ML-% IV SOLN
20.0000 mg | Freq: Once | INTRAVENOUS | Status: AC
  Administered 2024-08-14: 20 mg via INTRAVENOUS
  Filled 2024-08-14: qty 50

## 2024-08-14 MED ORDER — MORPHINE SULFATE (PF) 2 MG/ML IV SOLN: 2.0000 mg | INTRAVENOUS | Status: DC | PRN

## 2024-08-14 MED ORDER — ONDANSETRON HCL 4 MG/2ML IJ SOLN
4.0000 mg | Freq: Four times a day (QID) | INTRAMUSCULAR | Status: DC | PRN
  Administered 2024-08-14 – 2024-08-15 (×3): 4 mg via INTRAVENOUS
  Filled 2024-08-14 (×3): qty 2

## 2024-08-14 MED ORDER — OXYCODONE HCL 5 MG PO TABS: 5.0000 mg | ORAL_TABLET | ORAL | Status: DC | PRN

## 2024-08-14 MED ORDER — MORPHINE SULFATE (PF) 4 MG/ML IV SOLN
4.0000 mg | Freq: Once | INTRAVENOUS | Status: AC
  Administered 2024-08-14: 4 mg via INTRAVENOUS
  Filled 2024-08-14: qty 1

## 2024-08-14 NOTE — H&P (Signed)
 Patient ID: Todd Rojas, male   DOB: Jan 02, 1941, 83 y.o.   MRN: [id number]  HPI Todd Rojas is a 83 y.o. male s/p ileostomy takedown 5 days ago, went home 2 days ago and was doing well, ambulating tolerating diet and having bms, presents today with fever acute onset and some abdominal pain. Fever 102.6 CT pers reviewed showing some non specific findings, likely post op changes, no clear evidence of anastomotic leak, no free air.  Labs ok  HPI  Past Medical History:  Diagnosis Date   Actinic keratosis    Acute ST elevation myocardial infarction (STEMI) of anterior wall (HCC) 01/06/2005   a.) PCI 04/06/2005:100% mLAD (3.0 x 23 mm Cypher)   Adenomatous colon polyp    Anginal pain (HCC)    Aortic atherosclerosis (HCC)    Arthritis    Asthma    Basal cell carcinoma 03/17/2020   right ant lat deltoid   CAD (coronary artery disease)    a.) Anterolateral STEMI 01/06/2005: 100% mLAD (3.0 x 23 mm Cypher); b.) LHC/PCI  03/30/2009: 90% pLAD (3.0 x 18 mm Xience DES); c.) LHC/PCI 06/03/2021: 90% LAD (PTCA), 70% pLCx (3.5 x 12 mm DES - unk type), 70% OM (2.5 x 18 mm DES - unk type)   Cholelithiasis    COPD (chronic obstructive pulmonary disease) (HCC) 07/03/2017   DDD (degenerative disc disease), thoracolumbar    Dyspnea    Enlarged prostate    Esophageal stricture s/p dilation    GERD (gastroesophageal reflux disease)    Gout    Hearing loss 03/09/2013   Hemorrhoids    HFrEF (heart failure with reduced ejection fraction) (HCC)    Hiatal hernia    Hyperlipidemia, unspecified 07/03/2017   Hypertension 02/13/2012   Ileus due to infection [facility])    a.) s/p ileostomy creation 03/20/2024   Nephrolithiasis    NICM (nonischemic cardiomyopathy) (HCC)    On apixaban  therapy    Parastomal hernia    Pneumonia    Prostate mass 07/09/2024   a.) 2.7 cm PZ lesion in the LEFTposteromedial and posterolateral mid gland - highly suspicious for high-grade carcinoma   Pulmonary embolism  (HCC) 03/2024   Right-sided low back pain without sciatica 04/14/2016   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   R ear - ED&C   Status post coronary artery stent placement    a.) TOTAL # STENTS: 4 as of 08/08/2024   Thyroid  nodule    Unintentional weight loss    a.) down 40+ lbs between 02/2024 - 05/2024 --> associated with sepsis, colonic ileus (odor associated with mantainence), and metallic dysgeusia.    Past Surgical History:  Procedure Laterality Date   COLONOSCOPY WITH PROPOFOL  N/A 10/08/2018   Procedure: COLONOSCOPY WITH PROPOFOL ;  Surgeon: Viktoria Lamar DASEN, MD;  Location: [facility] ENDOSCOPY;  Service: Endoscopy;  Laterality: N/A;   COLONOSCOPY WITH PROPOFOL  N/A 01/06/2023   Procedure: COLONOSCOPY WITH PROPOFOL ;  Surgeon: Maryruth Ole DASEN, MD;  Location: [facility] ENDOSCOPY;  Service: Endoscopy;  Laterality: N/A;   CORONARY ANGIOPLASTY WITH STENT PLACEMENT Left 01/06/2005   Procedure: CORONARY ANGIOPLASTY WITH STENT PLACEMENT; Location: [facility]; Surgeon: Marsa Dooms, MD   CORONARY ANGIOPLASTY WITH STENT PLACEMENT Left 06/03/2021   Procedure: CORONARY ANGIOPLASTY WITH STENT PLACEMENT; Location: [facility]; Surgeon: Manford Blanch, MD   CORONARY ANGIOPLASTY WITH STENT PLACEMENT Left 03/30/2009   Procedure: CORONARY ANGIOPLASTY WITH STENT PLACEMENT; Location: [facility]; Surgeon: Manford Blanch, MD   CREATION, ILEOSTOMY, DIVERTING, ROBOT-ASSISTED  03/2024   CYSTOSCOPY WITH STENT PLACEMENT Left  08/09/2017   Procedure: CYSTOSCOPY WITH STENT PLACEMENT;  Surgeon: Chauncey Redell Agent, MD;  Location: [facility] ORS;  Service: Urology;  Laterality: Left;   FLEXIBLE SIGMOIDOSCOPY N/A 03/17/2024   Procedure: SIGMOIDOSCOPY, FLEXIBLE;  Surgeon: Toledo, Ladell POUR, MD;  Location: [facility] ENDOSCOPY;  Service: Gastroenterology;  Laterality: N/A;   ILEOSTOMY CLOSURE N/A 08/09/2024   Procedure: CLOSURE, ILEOSTOMY;  Surgeon: Jordis Laneta FALCON, MD;  Location: [facility] ORS;  Service: General;  Laterality: N/A;   LEFT HEART CATH  AND CORONARY ANGIOGRAPHY Left 05/16/2008   Procedure: LEFT HEART CATH AND CORONARY ANGIOGRAPHY; Location: [facility]; Surgeon: Marsa Overall, MD   LEFT HEART CATH AND CORONARY ANGIOGRAPHY Left 04/12/2018   Procedure: LEFT HEART CATH AND CORONARY ANGIOGRAPHY; Location: [facility]; Surgeon: Manford Blanch, MD   MEDIAL PARTIAL KNEE REPLACEMENT Bilateral 2016   PARASTOMAL HERNIA REPAIR N/A 08/09/2024   Procedure: REPAIR, HERNIA, PARASTOMAL;  Surgeon: Jordis Laneta FALCON, MD;  Location: [facility] ORS;  Service: General;  Laterality: N/A;   URETEROSCOPY WITH HOLMIUM LASER LITHOTRIPSY Left 08/09/2017   Procedure: URETEROSCOPY WITH HOLMIUM LASER LITHOTRIPSY/INCISION OF URETEROCELE;  Surgeon: Chauncey Redell Agent, MD;  Location: [facility] ORS;  Service: Urology;  Laterality: Left;   XI ROBOT ASSISTED DIAGNOSTIC LAPAROSCOPY N/A 03/20/2024   Procedure: LAPAROSCOPY, DIAGNOSTIC, ROBOT-ASSISTED;  Surgeon: Jordis Laneta FALCON, MD;  Location: [facility] ORS;  Service: General;  Laterality: N/A;    Family History  Problem Relation Age of Onset   Alzheimer's disease Mother    Leukemia Father    Prostate cancer Neg Hx    Kidney cancer Neg Hx    Bladder Cancer Neg Hx     Social History Social History   Tobacco Use   Smoking status: Former   Smokeless tobacco: Former    Types: Chew    Quit date: 08/07/1994  Vaping Use   Vaping status: Never Used  Substance Use Topics   Alcohol  use: No   Drug use: No    No Known Allergies  No current facility-administered medications for this encounter.   Current Outpatient Medications  Medication Sig Dispense Refill   acetaminophen  (TYLENOL ) 500 MG tablet Take 1,000 mg by mouth every 6 (six) hours as needed (pain.).     albuterol  (VENTOLIN  HFA) 108 (90 Base) MCG/ACT inhaler Inhale 1 puff into the lungs every 6 (six) hours as needed for wheezing or shortness of breath. 6.7 g 1   allopurinol  (ZYLOPRIM ) 300 MG tablet Take 300 mg by mouth in the morning.     budesonide -formoterol  (SYMBICORT ) 160-4.5  MCG/ACT inhaler Inhale 2 puffs into the lungs 2 (two) times daily. 1 each 1   colchicine 0.6 MG tablet Take 0.6 mg by mouth daily as needed (gout flares.).     diclofenac  Sodium (VOLTAREN ) 1 % GEL Apply 1 Application topically 4 (four) times daily as needed (pain.).     furosemide  (LASIX ) 20 MG tablet Take 20 mg by mouth daily as needed (fluid retention.).     nitroGLYCERIN  (NITROSTAT ) 0.4 MG SL tablet Place 0.4 mg under the tongue every 5 (five) minutes x 3 doses as needed for chest pain.     Probiotic Product (PROBIOTIC PO) Take 1 capsule by mouth in the morning.     rosuvastatin  (CRESTOR ) 20 MG tablet Take 20 mg by mouth in the morning.     vitamin B-12 (CYANOCOBALAMIN ) 1000 MCG tablet Take 1,000 mcg by mouth in the morning.     apixaban  (ELIQUIS ) 5 MG TABS tablet Take 2 tablets (10 mg total) by mouth 2 (two) times  daily for 4 days, THEN 1 tablet (5 mg total) 2 (two) times daily for 26 days. 68 tablet 0   ondansetron  (ZOFRAN -ODT) 4 MG disintegrating tablet Take 1 tablet (4 mg total) by mouth every 8 (eight) hours as needed for nausea or vomiting. (Patient not taking: Reported on 08/14/2024) 20 tablet 0   oxyCODONE  (OXY IR/ROXICODONE ) 5 MG immediate release tablet Take 1-2 tablets (5-10 mg total) by mouth every 4 (four) hours as needed for moderate pain (pain score 4-6). 30 tablet 0     Review of Systems Full ROS  was asked and was negative except for the information on the HPI  Physical Exam Blood pressure 137/73, pulse 98, temperature (!) 101.5 F (38.6 C), temperature source Oral, resp. rate 16, SpO2 96%. CONSTITUTIONAL: EYES: Pupils are equal, round, Sclera are non-icteric. EARS, NOSE, MOUTH AND THROAT: The oropharynx is clear. The oral mucosa is pink and moist. Hearing is intact to voice. LYMPH NODES:  Lymph nodes in the neck are normal. RESPIRATORY:  Lungs are clear. There is normal respiratory effort, with equal breath sounds bilaterally, and without pathologic use of accessory  muscles. CARDIOVASCULAR: Heart is regular without murmurs, gallops, or rubs. GI: The abdomen is  soft,mild tenderness , appropriate incisional tenderness w/o peritonitis, some erythema around the wound. There is no hepatosplenomegaly. There are normal bowel sounds  GU: Rectal deferred.   MUSCULOSKELETAL: Normal muscle strength and tone. No cyanosis or edema.   SKIN: Turgor is good and there are no pathologic skin lesions or ulcers. NEUROLOGIC: Motor and sensation is grossly normal. Cranial nerves are grossly intact. PSYCH:  Oriented to person, place and time. Affect is normal.  Data Reviewed I have personally reviewed the patient's imaging, laboratory findings and medical records.    Assessment/Plan Fevers following ostomy takedown , CT scan and abdominal exam not too concerning. Given fever he will need to be admitted, start empiric a/bs and serial exam. I consulted w another radiologist and she seems to opine that this is not anastomotic leak.  WE will keep npo, and continue fluids. May need to repeat CT w po contrast in 2 days or so If clinically indicated     Laneta Luna, MD FACS General Surgeon 08/14/2024, 6:45 PM

## 2024-08-14 NOTE — Consult Note (Addendum)
 Pharmacy Consult Note - Anticoagulation  Pharmacy Consult for Heparin  Indication: atrial fibrillation  PATIENT MEASUREMENTS: Height: 5' 9 (175.3 cm) Weight: 81.6 kg (179 lb 14.3 oz) IBW/kg (Calculated) : 70.7 HEPARIN  DW (KG): 81.6  VITAL SIGNS: Temp: 101.5 F (38.6 C) (08/27 1730) Temp Source: Oral (08/27 1730) BP: 122/61 (08/27 1830) Pulse Rate: 109 (08/27 1830)  Recent Labs    08/14/24 1726  HGB 10.8*  HCT 34.3*  PLT 184  CREATININE 1.34*    Estimated Creatinine Clearance: 41.8 mL/min (A) (by C-G formula based on SCr of 1.34 mg/dL (H)).  PAST MEDICAL HISTORY: Past Medical History:  Diagnosis Date   Actinic keratosis    Acute ST elevation myocardial infarction (STEMI) of anterior wall (HCC) 01/06/2005   a.) PCI 04/06/2005:100% mLAD (3.0 x 23 mm Cypher)   Adenomatous colon polyp    Anginal pain (HCC)    Aortic atherosclerosis (HCC)    Arthritis    Asthma    Basal cell carcinoma 03/17/2020   right ant lat deltoid   CAD (coronary artery disease)    a.) Anterolateral STEMI 01/06/2005: 100% mLAD (3.0 x 23 mm Cypher); b.) LHC/PCI  03/30/2009: 90% pLAD (3.0 x 18 mm Xience DES); c.) LHC/PCI 06/03/2021: 90% LAD (PTCA), 70% pLCx (3.5 x 12 mm DES - unk type), 70% OM (2.5 x 18 mm DES - unk type)   Cholelithiasis    COPD (chronic obstructive pulmonary disease) (HCC) 07/03/2017   DDD (degenerative disc disease), thoracolumbar    Dyspnea    Enlarged prostate    Esophageal stricture s/p dilation    GERD (gastroesophageal reflux disease)    Gout    Hearing loss 03/09/2013   Hemorrhoids    HFrEF (heart failure with reduced ejection fraction) (HCC)    Hiatal hernia    Hyperlipidemia, unspecified 07/03/2017   Hypertension 02/13/2012   Ileus due to infection [facility])    a.) s/p ileostomy creation 03/20/2024   Nephrolithiasis    NICM (nonischemic cardiomyopathy) (HCC)    On apixaban  therapy    Parastomal hernia    Pneumonia    Prostate mass 07/09/2024   a.) 2.7 cm PZ lesion  in the LEFTposteromedial and posterolateral mid gland - highly suspicious for high-grade carcinoma   Pulmonary embolism (HCC) 03/2024   Right-sided low back pain without sciatica 04/14/2016   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   R ear - ED&C   Status post coronary artery stent placement    a.) TOTAL # STENTS: 4 as of 08/08/2024   Thyroid  nodule    Unintentional weight loss    a.) down 40+ lbs between 02/2024 - 05/2024 --> associated with sepsis, colonic ileus (odor associated with mantainence), and metallic dysgeusia.    ASSESSMENT: 83 y.o. male with PMH of A.fib. CAD, CHF, COPD, and HTN s/p ileostomy takedown 5 days ago and is presenting with NVD and increased fevers. US  Negative for DVT on 8/27. Patient is on Eliquis  for A.fib per chart review. RN confirmed with the patient that he took his last dose @ 11AM on 8/27. Pharmacy has been consulted to initiate and manage heparin  intravenous infusion.    Baseline labs: Heparin  level <1.10, aPTT 35. Hgb and Plts stable. Baseline heparin  level elevated due to home DOAC use.   Pertinent medications: Scheduled:   heparin   4,000 Units Intravenous Once   Infusions:   heparin      lactated ringers        Goal(s) of therapy: Heparin  level 0.3 - 0.7 units/mL aPTT  66 - 102 seconds Monitor platelets by anticoagulation protocol: Yes   Baseline anticoagulation labs: Recent Labs    08/12/24 0543 08/14/24 1726  HGB 9.3* 10.8*  PLT 161 184      PLAN: Will omit bolus due to recent dose of Eliquis  this morning and then start heparin  infusion at 1150 units/hour. Check aPTT in 8 hours then order daily Heparin  level with proceeding aPTT.  Continue to titrate by aPTT until heparin  level and aPTT correlate and/or  washes out, then titrate by heparin  level alone. Check heparin  level with next AM labs. Continue to monitor CBC daily while on heparin  infusion.   Annabella Banks, PharmD Clinical Pharmacist 08/14/2024 7:26 PM

## 2024-08-14 NOTE — ED Provider Notes (Signed)
 [facility] Provider Note    Event Date/Time   First MD Initiated Contact with Patient 08/14/24 1701     (approximate)   History   Fever and Post-op Problem   HPI  Todd Rojas is a 83 y.o. 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  Patient reports his last bowel movement was this morning and was normal.  He developed nausea and vomiting soon afterwards.  Reports increasing abdominal distention and pain.  He denies any chest pain or shortness of breath.  Reports compliance of all of his medications.  His wife presents with him and contributes to the history     Physical Exam   Triage Vital Signs: ED Triage Vitals [08/14/24 1653]  Encounter Vitals Group     BP 117/62     Girls Systolic BP Percentile      Girls Diastolic BP Percentile      Boys Systolic BP Percentile      Boys Diastolic BP Percentile      Pulse Rate (!) 107     Resp 18     Temp (!) 102.7 F (39.3 C)     Temp Source Oral     SpO2 95 %     Weight      Height      Head Circumference      Peak Flow      Pain Score 4     Pain Loc      Pain Education      Exclude from Growth Chart     Most recent vital signs: Vitals:   08/14/24 2027 08/14/24 2050  BP:  (!) 111/53  Pulse:  75  Resp:  (!) 24  Temp: (!) 100.4 F (38 C) 98.4 F (36.9 C)  SpO2:  93%    Nursing Triage Note reviewed. Vital signs reviewed and patients oxygen saturation is normoxic  General: Patient is well nourished, well developed, awake and alert, appears uncomfortable Head: Normocephalic and atraumatic Eyes: Normal inspection, extraocular muscles intact, no conjunctival pallor Ear, nose, throat: Normal external exam Neck: Normal range of motion Respiratory: Patient is in no  respiratory distress, lungs CTAB Cardiovascular: Patient is not tachycardic, RRR without murmur appreciated GI: Abd soft, tender to palpation, distended  Back: Normal inspection of the back with good strength and range of motion throughout all ext Extremities: pulses intact with good cap refills, no LE pitting edema or calf tenderness Neuro: The patient is alert and oriented to person, place, and time, appropriately conversive, with 5/5 bilat UE/LE strength, no gross motor or sensory defects noted. Coordination appears to be adequate. Skin: Warm, dry, and intact Psych: normal mood and affect, no SI or HI  ED Results / Procedures / Treatments   Labs (all labs ordered are listed, but only abnormal results are displayed) Labs Reviewed  CBC WITH DIFFERENTIAL/PLATELET - Abnormal; Notable for the following components:      Result Value   WBC 11.1 (*)    RBC 3.35 (*)    Hemoglobin 10.8 (*)    HCT 34.3 (*)    MCV 102.4 (*)    Neutro Abs 9.4 (*)    All other components within normal limits  COMPREHENSIVE METABOLIC PANEL WITH GFR - Abnormal; Notable for the following components:   Glucose, Bld  118 (*)    Creatinine, Ser 1.34 (*)    Calcium  8.8 (*)    Total Protein 6.2 (*)    AST 13 (*)    GFR, Estimated 53 (*)    All other components within normal limits  HEPARIN  LEVEL (UNFRACTIONATED) - Abnormal; Notable for the following components:   Heparin  Unfractionated >1.10 (*)    All other components within normal limits  CULTURE, BLOOD (ROUTINE X 2)  CULTURE, BLOOD (ROUTINE X 2)  LACTIC ACID, PLASMA  LACTIC ACID, PLASMA  APTT  URINALYSIS, ROUTINE W REFLEX MICROSCOPIC  CBC  COMPREHENSIVE METABOLIC PANEL WITH GFR  MAGNESIUM   PHOSPHORUS  APTT     EKG EKG and rhythm strip are interpreted by myself:   EKG: [Normal sinus rhythm] at heart rate of 95, normal QRS duration, QTc [444], nonspecific ST segments and T waves no ectopy EKG not consistent with Acute STEMI Rhythm strip: NSR in  lead II   RADIOLOGY CT abd and pelvis with iv contrast: Stranding around anastomosis possibly concerning for leak, independent review interpretation radiologist agrees    PROCEDURES:  Critical Care performed: No  Procedures   MEDICATIONS ORDERED IN ED: Medications  0.9 %  sodium chloride  infusion ( Intravenous New Bag/Given 08/14/24 2206)  piperacillin -tazobactam (ZOSYN ) IVPB 3.375 g (has no administration in time range)  acetaminophen  (TYLENOL ) tablet 1,000 mg (1,000 mg Oral Given 08/14/24 2334)  oxyCODONE  (Oxy IR/ROXICODONE ) immediate release tablet 5-10 mg (has no administration in time range)  morphine  (PF) 2 MG/ML injection 2 mg (has no administration in time range)  ondansetron  (ZOFRAN -ODT) disintegrating tablet 4 mg ( Oral See Alternative 08/14/24 2334)    Or  ondansetron  (ZOFRAN ) injection 4 mg (4 mg Intravenous Given 08/14/24 2334)  pantoprazole  (PROTONIX ) injection 40 mg (40 mg Intravenous Given 08/14/24 2207)  hydrALAZINE  (APRESOLINE ) injection 10 mg (has no administration in time range)  diphenhydrAMINE  (BENADRYL ) 12.5 MG/5ML elixir 12.5 mg (has no administration in time range)    Or  diphenhydrAMINE  (BENADRYL ) injection 12.5 mg (has no administration in time range)  heparin  ADULT infusion 100 units/mL (25000 units/250mL) (1,150 Units/hr Intravenous New Bag/Given 08/14/24 2147)  lactated ringers  bolus 1,000 mL (0 mLs Intravenous Stopped 08/14/24 1859)  piperacillin -tazobactam (ZOSYN ) IVPB 4.5 g (0 g Intravenous Stopped 08/14/24 1816)  ondansetron  (ZOFRAN ) injection 4 mg (4 mg Intravenous Given 08/14/24 1730)  famotidine  (PEPCID ) IVPB 20 mg premix (0 mg Intravenous Stopped 08/14/24 1816)  morphine  (PF) 4 MG/ML injection 4 mg (4 mg Intravenous Given 08/14/24 1731)  iohexol  (OMNIPAQUE ) 300 MG/ML solution 100 mL (100 mLs Intravenous Contrast Given 08/14/24 1811)  lactated ringers  bolus 1,000 mL (1,000 mLs Intravenous New Bag/Given 08/14/24 2022)     IMPRESSION / MDM / ASSESSMENT  AND PLAN / ED COURSE                                Differential diagnosis includes, but is not limited to, anastomosis leak, small bowel obstruction, constipation, sepsis, UTI, arrhythmia   ED course: Patient presents and abdomen is tender but no evidence of peritonitis.  Given his recent surgical presentation decision made to obtain blood cultures and lactate and initiate Zosyn  and IV fluids.  Luckility lactate was not elevated.  General surgery at bedside and evaluated the patient.  CT abdomen and pelvis concerning for possible anastomosis leak.  General surgery will admit the patient for further monitoring   Clinical Course as of 08/15/24 0000  Wed  Aug 14, 2024  1826 Dr. Jordis at bedside.  He will admit to his service [HD]    Clinical Course User Index [HD] Nicholaus Rolland BRAVO, MD   Data(2/3 categories following were performed): I reviewed or ordered at least three unique tests, external notes, and/or the history required an independent historian as one of the three requirements as following: At least 3 labs/imaging studies were obtained and/or reviewed. AND  I discussed the management of the patient with the following external physician or qualified healthcare provider: Admitting physician  Risk: This patient has a high risk of morbidity due to further diagnostic testing or treatment. Rationale: Decision made regarding admission  Admit Level 5 - Labs/Rads, Admit, Consult:  Suggested E/M Coding Level: 5, 99285  This level has been selected based on the 05-Sep-2022 CPT guidelines for E/M codes in the Emergency Department based on 2/3 of the CoPA, Data, and Risk.   FINAL CLINICAL IMPRESSION(S) / ED DIAGNOSES   Final diagnoses:  Generalized abdominal pain  Dehiscence of gastrointestinal anastomosis, initial encounter     Rx / DC Orders   ED Discharge Orders     None        Note:  This document was prepared using Dragon voice recognition software and may include  unintentional dictation errors.   Nicholaus Rolland BRAVO, MD 08/15/24 0000

## 2024-08-14 NOTE — ED Triage Notes (Signed)
 Pt to ED via POV from home. Pt reports had a reverse ileostomy on 8/22. Pt reports came home on Monday. Pt reports woke up this am feeling unwell and fever of 101. Pt reports some drainage around incision site.

## 2024-08-14 NOTE — ED Notes (Signed)
 Patient transported to CT

## 2024-08-14 NOTE — ED Notes (Signed)
 Pt had dry heaving prior to getting zofran , at this time pt is calm, having chills.

## 2024-08-14 NOTE — ED Notes (Signed)
 US  @ bedside, 2nd floor called to notify them of pt's arrival to unit p US   Float RN

## 2024-08-14 NOTE — ED Notes (Addendum)
 Pt had apneic episode on monitor with SpO2 dropping to 87%, however came up to normal levels quickly. Floor staff notified.

## 2024-08-15 DIAGNOSIS — R5082 Postprocedural fever: Secondary | ICD-10-CM | POA: Diagnosis not present

## 2024-08-15 DIAGNOSIS — R5081 Fever presenting with conditions classified elsewhere: Secondary | ICD-10-CM | POA: Diagnosis not present

## 2024-08-15 LAB — COMPREHENSIVE METABOLIC PANEL WITH GFR
ALT: 8 U/L (ref 0–44)
ALT: 9 U/L (ref 0–44)
AST: <10 U/L — ABNORMAL LOW (ref 15–41)
AST: 18 U/L (ref 15–41)
Albumin: 2.7 g/dL — ABNORMAL LOW (ref 3.5–5.0)
Albumin: 2.8 g/dL — ABNORMAL LOW (ref 3.5–5.0)
Alkaline Phosphatase: 32 U/L — ABNORMAL LOW (ref 38–126)
Alkaline Phosphatase: 33 U/L — ABNORMAL LOW (ref 38–126)
Anion gap: 6 (ref 5–15)
Anion gap: 8 (ref 5–15)
BUN: 17 mg/dL (ref 8–23)
BUN: 19 mg/dL (ref 8–23)
CO2: 26 mmol/L (ref 22–32)
CO2: 26 mmol/L (ref 22–32)
Calcium: 8.1 mg/dL — ABNORMAL LOW (ref 8.9–10.3)
Calcium: 8.1 mg/dL — ABNORMAL LOW (ref 8.9–10.3)
Chloride: 107 mmol/L (ref 98–111)
Chloride: 105 mmol/L (ref 98–111)
Creatinine, Ser: 1.41 mg/dL — ABNORMAL HIGH (ref 0.61–1.24)
Creatinine, Ser: 1.64 mg/dL — ABNORMAL HIGH (ref 0.61–1.24)
GFR, Estimated: 49 mL/min — ABNORMAL LOW (ref >60)
GFR, Estimated: 41 mL/min — ABNORMAL LOW (ref >60)
Glucose, Bld: 107 mg/dL — ABNORMAL HIGH (ref 70–99)
Glucose, Bld: 98 mg/dL (ref 70–99)
Potassium: 3.6 mmol/L (ref 3.5–5.1)
Potassium: 3.7 mmol/L (ref 3.5–5.1)
Sodium: 139 mmol/L (ref 135–145)
Sodium: 139 mmol/L (ref 135–145)
Total Bilirubin: 0.8 mg/dL (ref 0.0–1.2)
Total Bilirubin: 0.9 mg/dL (ref 0.0–1.2)
Total Protein: 5.3 g/dL — ABNORMAL LOW (ref 6.5–8.1)
Total Protein: 5.4 g/dL — ABNORMAL LOW (ref 6.5–8.1)

## 2024-08-15 LAB — CBC
HCT: 27.2 % — ABNORMAL LOW (ref 39.0–52.0)
Hemoglobin: 9 g/dL — ABNORMAL LOW (ref 13.0–17.0)
MCH: 33 pg (ref 26.0–34.0)
MCHC: 33.1 g/dL (ref 30.0–36.0)
MCV: 99.6 fL (ref 80.0–100.0)
Platelets: 166 K/uL (ref 150–400)
RBC: 2.73 MIL/uL — ABNORMAL LOW (ref 4.22–5.81)
RDW: 14.9 % (ref 11.5–15.5)
WBC: 9.4 K/uL (ref 4.0–10.5)
nRBC: 0 % (ref 0.0–0.2)

## 2024-08-15 LAB — SARS CORONAVIRUS 2 BY RT PCR: SARS Coronavirus 2 by RT PCR: NEGATIVE

## 2024-08-15 LAB — URINALYSIS, ROUTINE W REFLEX MICROSCOPIC
Bacteria, UA: NONE SEEN
Bilirubin Urine: NEGATIVE
Glucose, UA: NEGATIVE mg/dL
Ketones, ur: NEGATIVE mg/dL
Leukocytes,Ua: NEGATIVE
Nitrite: NEGATIVE
Protein, ur: NEGATIVE mg/dL
RBC / HPF: 0 RBC/hpf (ref 0–5)
Specific Gravity, Urine: 1.032 — ABNORMAL HIGH (ref 1.005–1.030)
pH: 5 (ref 5.0–8.0)

## 2024-08-15 LAB — RESPIRATORY PANEL BY PCR

## 2024-08-15 LAB — APTT
aPTT: 64 s — ABNORMAL HIGH (ref 24–36)
aPTT: 104 s — ABNORMAL HIGH (ref 24–36)

## 2024-08-15 LAB — C DIFFICILE QUICK SCREEN W PCR REFLEX
C Diff antigen: POSITIVE — AB
C Diff interpretation: DETECTED
C Diff toxin: POSITIVE — AB

## 2024-08-15 LAB — MAGNESIUM: Magnesium: 1.7 mg/dL (ref 1.7–2.4)

## 2024-08-15 LAB — HEPARIN LEVEL (UNFRACTIONATED): Heparin Unfractionated: >1.1 [IU]/mL — ABNORMAL HIGH (ref 0.30–0.70)

## 2024-08-15 LAB — PHOSPHORUS: Phosphorus: 2.8 mg/dL (ref 2.5–4.6)

## 2024-08-15 MED ORDER — HEPARIN BOLUS VIA INFUSION
1200.0000 [IU] | Freq: Once | INTRAVENOUS | Status: AC
  Administered 2024-08-15: 1200 [IU] via INTRAVENOUS
  Filled 2024-08-15: qty 1200

## 2024-08-15 MED ORDER — VANCOMYCIN HCL 125 MG PO CAPS: 125.0000 mg | ORAL_CAPSULE | Freq: Two times a day (BID) | ORAL | Status: DC

## 2024-08-15 MED ORDER — IRON SUCROSE 300 MG IVPB - SIMPLE MED
300.0000 mg | Freq: Once | Status: AC
  Administered 2024-08-15: 300 mg via INTRAVENOUS
  Filled 2024-08-15: qty 300

## 2024-08-15 MED ORDER — VANCOMYCIN HCL 125 MG PO CAPS: 125.0000 mg | ORAL_CAPSULE | Freq: Every day | ORAL | Status: DC

## 2024-08-15 MED ORDER — VANCOMYCIN HCL 125 MG PO CAPS: 125.0000 mg | ORAL_CAPSULE | ORAL | Status: DC

## 2024-08-15 MED ORDER — VANCOMYCIN HCL 125 MG PO CAPS: 125.0000 mg | ORAL_CAPSULE | Freq: Four times a day (QID) | ORAL | Status: DC

## 2024-08-15 MED ORDER — FIDAXOMICIN 200 MG PO TABS
200.0000 mg | ORAL_TABLET | Freq: Two times a day (BID) | ORAL | Status: DC
Start: 2024-08-15 — End: 2024-08-25
  Administered 2024-08-15: 200 mg via ORAL
  Filled 2024-08-15 (×3): qty 1

## 2024-08-15 NOTE — Consult Note (Signed)
 Pharmacy Consult Note - Anticoagulation  Pharmacy Consult for Heparin  Indication: atrial fibrillation  PATIENT MEASUREMENTS: Height: 5' 9 (175.3 cm) Weight: 81.6 kg (179 lb 14.3 oz) IBW/kg (Calculated) : 70.7 HEPARIN  DW (KG): 81.6  VITAL SIGNS: Temp: 99.6 F (37.6 C) (08/28 0504) Temp Source: Oral (08/28 0504) BP: 112/65 (08/28 0504) Pulse Rate: 81 (08/28 0504)  Recent Labs    08/15/24 0429  HGB 9.0*  HCT 27.2*  PLT 166  APTT 64*  HEPARINUNFRC >1.10*  CREATININE 1.41*    Estimated Creatinine Clearance: 39.7 mL/min (A) (by C-G formula based on SCr of 1.41 mg/dL (H)).  PAST MEDICAL HISTORY: Past Medical History:  Diagnosis Date   Actinic keratosis    Acute ST elevation myocardial infarction (STEMI) of anterior wall (HCC) 01/06/2005   a.) PCI 04/06/2005:100% mLAD (3.0 x 23 mm Cypher)   Adenomatous colon polyp    Anginal pain (HCC)    Aortic atherosclerosis (HCC)    Arthritis    Asthma    Basal cell carcinoma 03/17/2020   right ant lat deltoid   CAD (coronary artery disease)    a.) Anterolateral STEMI 01/06/2005: 100% mLAD (3.0 x 23 mm Cypher); b.) LHC/PCI  03/30/2009: 90% pLAD (3.0 x 18 mm Xience DES); c.) LHC/PCI 06/03/2021: 90% LAD (PTCA), 70% pLCx (3.5 x 12 mm DES - unk type), 70% OM (2.5 x 18 mm DES - unk type)   Cholelithiasis    COPD (chronic obstructive pulmonary disease) (HCC) 07/03/2017   DDD (degenerative disc disease), thoracolumbar    Dyspnea    Enlarged prostate    Esophageal stricture s/p dilation    GERD (gastroesophageal reflux disease)    Gout    Hearing loss 03/09/2013   Hemorrhoids    HFrEF (heart failure with reduced ejection fraction) (HCC)    Hiatal hernia    Hyperlipidemia, unspecified 07/03/2017   Hypertension 02/13/2012   Ileus due to infection [facility])    a.) s/p ileostomy creation 03/20/2024   Nephrolithiasis    NICM (nonischemic cardiomyopathy) (HCC)    On apixaban  therapy    Parastomal hernia    Pneumonia    Prostate mass  07/09/2024   a.) 2.7 cm PZ lesion in the LEFTposteromedial and posterolateral mid gland - highly suspicious for high-grade carcinoma   Pulmonary embolism (HCC) 03/2024   Right-sided low back pain without sciatica 04/14/2016   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   R ear - ED&C   Status post coronary artery stent placement    a.) TOTAL # STENTS: 4 as of 08/08/2024   Thyroid  nodule    Unintentional weight loss    a.) down 40+ lbs between 02/2024 - 05/2024 --> associated with sepsis, colonic ileus (odor associated with mantainence), and metallic dysgeusia.    ASSESSMENT: 83 y.o. male with PMH of A.fib. CAD, CHF, COPD, and HTN s/p ileostomy takedown 5 days ago and is presenting with NVD and increased fevers. US  Negative for DVT on 8/27. Patient is on Eliquis  for A.fib per chart review. RN confirmed with the patient that he took his last dose @ 11AM on 8/27. Pharmacy has been consulted to initiate and manage heparin  intravenous infusion.    Baseline labs: Heparin  level >1.10, aPTT 35. Hgb and Plts stable. Baseline heparin  level elevated due to home DOAC use.   Pertinent medications: Scheduled:   acetaminophen   1,000 mg Oral Q6H   pantoprazole  (PROTONIX ) IV  40 mg Intravenous Q12H   Infusions:   sodium chloride  100 mL/hr at 08/14/24 2206  heparin  1,150 Units/hr (08/14/24 2147)   piperacillin -tazobactam (ZOSYN )  IV 3.375 g (08/15/24 0123)     Goal(s) of therapy: Heparin  level 0.3 - 0.7 units/mL aPTT 66 - 102 seconds Monitor platelets by anticoagulation protocol: Yes   Baseline anticoagulation labs: Recent Labs    08/14/24 1726 08/14/24 2108 08/15/24 0429  APTT  --  35 64*  HGB 10.8*  --  9.0*  PLT 184  --  166      8/28@0429 : aPTT 64 sec, HL>1.10  PLAN: aPTT subtherapeutic, not correlating with HL currently Bolus 1200 units x 1 Increase heparin  infusion rate to 1300 units/hr Check aPTT in 8 hours after rate change and heparin  level daily Continue to titrate by  aPTT until heparin  level and aPTT correlate and/or  washes out, then titrate by heparin  level alone. Check heparin  level with next AM labs. Continue to monitor CBC daily while on heparin  infusion.   Millie Shorb A Storm Sovine, PharmD Clinical Pharmacist 08/15/2024 5:56 AM

## 2024-08-15 NOTE — Consult Note (Addendum)
 History and Physical    Todd Rojas:[id number] DOB: [birth year] DOA: 08/14/2024  DOS: the patient was seen and examined on 08/14/2024  PCP: Sadie Manna, MD   Patient coming from: Home  I have personally briefly reviewed patient's old medical records in [facility]  Chief Complaint: Fever 100.4 last night  HPI: Todd Rojas is a pleasant 83 y.o. 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  Patient was admitted to surgical service.  After workup was negative including chest x-ray, urine analysis, viral panel and CT abdomen and pelvis without any abscess, hospitalist service was consulted for evaluation for consultation to assist with fever of unknown origin.  Of note patient had a fever of unknown origin in the past as well. Patient was seen and examined at bedside.  Patient stated that he had a fever at home.  He denies any hematemesis, melena, cough, shortness of breath, dysuria or any ulcers.  His surgical site looks clean, dry, intact.  He denies any history of tick bite, musculoskeletal disorder, or rheumatoid conditions.    Review of Systems:  ROS  All other systems negative except as noted in the HPI.  Past Medical History:  Diagnosis Date   Actinic keratosis    Acute ST elevation myocardial infarction (STEMI) of anterior wall (HCC) 01/06/2005   a.) PCI 04/06/2005:100% mLAD (3.0 x 23 mm Cypher)   Adenomatous colon polyp    Anginal pain (HCC)    Aortic atherosclerosis (HCC)    Arthritis    Asthma    Basal cell carcinoma 03/17/2020   right ant lat deltoid   CAD (coronary artery disease)    a.) Anterolateral STEMI 01/06/2005: 100% mLAD (3.0 x 23 mm Cypher); b.) LHC/PCI  03/30/2009:  90% pLAD (3.0 x 18 mm Xience DES); c.) LHC/PCI 06/03/2021: 90% LAD (PTCA), 70% pLCx (3.5 x 12 mm DES - unk type), 70% OM (2.5 x 18 mm DES - unk type)   Cholelithiasis    COPD (chronic obstructive pulmonary disease) (HCC) 07/03/2017   DDD (degenerative disc disease), thoracolumbar    Dyspnea    Enlarged prostate    Esophageal stricture s/p dilation    GERD (gastroesophageal reflux disease)    Gout    Hearing loss 03/09/2013   Hemorrhoids    HFrEF (heart failure with reduced ejection fraction) (HCC)    Hiatal hernia    Hyperlipidemia, unspecified 07/03/2017   Hypertension 02/13/2012   Ileus due to infection [facility])    a.) s/p ileostomy creation 03/20/2024   Nephrolithiasis    NICM (nonischemic cardiomyopathy) (HCC)    On apixaban  therapy    Parastomal hernia    Pneumonia    Prostate mass 07/09/2024   a.) 2.7 cm PZ lesion in the LEFTposteromedial and posterolateral mid gland - highly suspicious for high-grade carcinoma   Pulmonary embolism (HCC) 03/2024   Right-sided low back pain without sciatica 04/14/2016   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   R ear - ED&C   Status post coronary artery stent placement    a.) TOTAL # STENTS: 4 as of 08/08/2024   Thyroid  nodule    Unintentional weight loss    a.) down 40+ lbs between 02/2024 - 05/2024 --> associated with sepsis, colonic ileus (  odor associated with mantainence), and metallic dysgeusia.    Past Surgical History:  Procedure Laterality Date   COLONOSCOPY WITH PROPOFOL  N/A 10/08/2018   Procedure: COLONOSCOPY WITH PROPOFOL ;  Surgeon: Viktoria Lamar DASEN, MD;  Location: [facility] ENDOSCOPY;  Service: Endoscopy;  Laterality: N/A;   COLONOSCOPY WITH PROPOFOL  N/A 01/06/2023   Procedure: COLONOSCOPY WITH PROPOFOL ;  Surgeon: Maryruth Ole DASEN, MD;  Location: [facility] ENDOSCOPY;  Service: Endoscopy;  Laterality: N/A;   CORONARY ANGIOPLASTY WITH STENT PLACEMENT Left 01/06/2005   Procedure: CORONARY ANGIOPLASTY WITH STENT PLACEMENT; Location:  [facility]; Surgeon: Marsa Dooms, MD   CORONARY ANGIOPLASTY WITH STENT PLACEMENT Left 06/03/2021   Procedure: CORONARY ANGIOPLASTY WITH STENT PLACEMENT; Location: [facility]; Surgeon: Manford Blanch, MD   CORONARY ANGIOPLASTY WITH STENT PLACEMENT Left 03/30/2009   Procedure: CORONARY ANGIOPLASTY WITH STENT PLACEMENT; Location: [facility]; Surgeon: Manford Blanch, MD   CREATION, ILEOSTOMY, DIVERTING, ROBOT-ASSISTED  03/2024   CYSTOSCOPY WITH STENT PLACEMENT Left 08/09/2017   Procedure: CYSTOSCOPY WITH STENT PLACEMENT;  Surgeon: Chauncey Redell Agent, MD;  Location: [facility] ORS;  Service: Urology;  Laterality: Left;   FLEXIBLE SIGMOIDOSCOPY N/A 03/17/2024   Procedure: SIGMOIDOSCOPY, FLEXIBLE;  Surgeon: Toledo, Ladell POUR, MD;  Location: [facility] ENDOSCOPY;  Service: Gastroenterology;  Laterality: N/A;   ILEOSTOMY CLOSURE N/A 08/09/2024   Procedure: CLOSURE, ILEOSTOMY;  Surgeon: Jordis Laneta FALCON, MD;  Location: [facility] ORS;  Service: General;  Laterality: N/A;   LEFT HEART CATH AND CORONARY ANGIOGRAPHY Left 05/16/2008   Procedure: LEFT HEART CATH AND CORONARY ANGIOGRAPHY; Location: [facility]; Surgeon: Marsa Overall, MD   LEFT HEART CATH AND CORONARY ANGIOGRAPHY Left 04/12/2018   Procedure: LEFT HEART CATH AND CORONARY ANGIOGRAPHY; Location: [facility]; Surgeon: Manford Blanch, MD   MEDIAL PARTIAL KNEE REPLACEMENT Bilateral 2016   PARASTOMAL HERNIA REPAIR N/A 08/09/2024   Procedure: REPAIR, HERNIA, PARASTOMAL;  Surgeon: Jordis Laneta FALCON, MD;  Location: [facility] ORS;  Service: General;  Laterality: N/A;   URETEROSCOPY WITH HOLMIUM LASER LITHOTRIPSY Left 08/09/2017   Procedure: URETEROSCOPY WITH HOLMIUM LASER LITHOTRIPSY/INCISION OF URETEROCELE;  Surgeon: Chauncey Redell Agent, MD;  Location: [facility] ORS;  Service: Urology;  Laterality: Left;   XI ROBOT ASSISTED DIAGNOSTIC LAPAROSCOPY N/A 03/20/2024   Procedure: LAPAROSCOPY, DIAGNOSTIC, ROBOT-ASSISTED;  Surgeon: Jordis Laneta FALCON, MD;  Location: [facility] ORS;  Service: General;  Laterality: N/A;      reports that he has quit smoking. He quit smokeless tobacco use about 30 years ago.  His smokeless tobacco use included chew. He reports that he does not drink alcohol  and does not use drugs.  No Known Allergies  Family History  Problem Relation Age of Onset   Alzheimer's disease Mother    Leukemia Father    Prostate cancer Neg Hx    Kidney cancer Neg Hx    Bladder Cancer Neg Hx     Prior to Admission medications   Medication Sig Start Date End Date Taking? Authorizing Provider  acetaminophen  (TYLENOL ) 500 MG tablet Take 1,000 mg by mouth every 6 (six) hours as needed (pain.).   Yes [provider]  albuterol  (VENTOLIN  HFA) 108 (90 Base) MCG/ACT inhaler Inhale 1 puff into the lungs every 6 (six) hours as needed for wheezing or shortness of breath. 04/09/24  Yes Fausto Sor A, DO  allopurinol  (ZYLOPRIM ) 300 MG tablet Take 300 mg by mouth in the morning. 01/31/17  Yes [provider]  apixaban  (ELIQUIS ) 5 MG TABS tablet Take 5 mg by mouth 2 (two) times daily.   Yes [provider]  budesonide -formoterol  (SYMBICORT ) 160-4.5 MCG/ACT inhaler  Inhale 2 puffs into the lungs 2 (two) times daily. 04/09/24 02/25/27 Yes Fausto Sor A, DO  colchicine 0.6 MG tablet Take 0.6 mg by mouth daily as needed (gout flares.).   Yes [provider]  diclofenac  Sodium (VOLTAREN ) 1 % GEL Apply 1 Application topically 4 (four) times daily as needed (pain.).   Yes [provider]  furosemide  (LASIX ) 20 MG tablet Take 20 mg by mouth daily as needed (fluid retention.).   Yes [provider]  nitroGLYCERIN  (NITROSTAT ) 0.4 MG SL tablet Place 0.4 mg under the tongue every 5 (five) minutes x 3 doses as needed for chest pain. 03/31/20  Yes [provider]  Probiotic Product (PROBIOTIC PO) Take 1 capsule by mouth in the morning.   Yes [provider]  rosuvastatin  (CRESTOR ) 20 MG tablet Take 20 mg by mouth in the morning.   Yes [provider]   vitamin B-12 (CYANOCOBALAMIN ) 1000 MCG tablet Take 1,000 mcg by mouth in the morning.   Yes [provider]  ondansetron  (ZOFRAN -ODT) 4 MG disintegrating tablet Take 1 tablet (4 mg total) by mouth every 8 (eight) hours as needed for nausea or vomiting. Patient not taking: Reported on 08/14/2024 04/09/24   Fausto Sor A, DO  oxyCODONE  (OXY IR/ROXICODONE ) 5 MG immediate release tablet Take 1-2 tablets (5-10 mg total) by mouth every 4 (four) hours as needed for moderate pain (pain score 4-6). 08/12/24   Terryl Arthea SAUNDERS, PA-C    Physical Exam: Vitals:   08/14/24 2024 08/14/24 2027 08/14/24 2050 08/15/24 0504  BP:   (!) 111/53 112/65  Pulse:   75 81  Resp: 15  (!) 24 20  Temp:  (!) 100.4 F (38 C) 98.4 F (36.9 C) 99.6 F (37.6 C)  TempSrc:  Oral  Oral  SpO2:   93% 97%  Weight:      Height:        Physical Exam   Constitutional: Alert, awake, calm, comfortable HEENT: Neck supple Respiratory: Clear to auscultation B/L, no wheezing, no rales.  Cardiovascular: Regular rate and rhythm, no murmurs / rubs / gallops. No extremity edema. 2+ pedal pulses. No carotid bruits.  Abdomen: Soft, mild tenderness, Bowel sounds positive.  No signs of peritonitis Musculoskeletal: no clubbing / cyanosis. Good ROM, no contractures. Normal muscle tone.  Skin: no rashes, lesions, ulcers. Neurologic: CN 2-12 grossly intact. Sensation intact, No focal deficit identified Psychiatric: Alert and oriented x 3. Normal mood.    Labs on Admission: I have personally reviewed following labs and imaging studies  CBC: Recent Labs  Lab 08/10/24 0455 08/12/24 0543 08/14/24 1726 08/15/24 0429  WBC 12.5* 8.2 11.1* 9.4  NEUTROABS  --   --  9.4*  --   HGB 10.5* 9.3* 10.8* 9.0*  HCT 31.3* 29.9* 34.3* 27.2*  MCV 99.4 103.8* 102.4* 99.6  PLT 178 161 184 166   Basic Metabolic Panel: Recent Labs  Lab 08/10/24 0455 08/12/24 0543 08/14/24 1726 08/15/24 0429  NA 137 140 142 139  K 4.6 3.9 3.8 3.6   CL 110 110 104 107  CO2 23 25 24 26   GLUCOSE 143* 95 118* 107*  BUN 20 24* 20 17  CREATININE 1.26* 1.69* 1.34* 1.41*  CALCIUM  8.5* 8.4* 8.8* 8.1*  MG  --   --   --  1.7  PHOS  --   --   --  2.8   GFR: Estimated Creatinine Clearance: 39.7 mL/min (A) (by C-G formula based on SCr of 1.41 mg/dL (  H)). Liver Function Tests: Recent Labs  Lab 08/14/24 1726 08/15/24 0429  AST 13* <10*  ALT 13 8  ALKPHOS 40 32*  BILITOT 0.8 0.8  PROT 6.2* 5.3*  ALBUMIN  3.5 2.7*   No results for input(s): LIPASE, AMYLASE in the last 168 hours. No results for input(s): AMMONIA in the last 168 hours. Coagulation Profile: No results for input(s): INR, PROTIME in the last 168 hours. Cardiac Enzymes: No results for input(s): CKTOTAL, CKMB, CKMBINDEX, TROPONINI, TROPONINIHS in the last 168 hours. BNP (last 3 results) Recent Labs    03/09/24 1145  BNP 145.6*   HbA1C: No results for input(s): HGBA1C in the last 72 hours. CBG: No results for input(s): GLUCAP in the last 168 hours. Lipid Profile: No results for input(s): CHOL, HDL, LDLCALC, TRIG, CHOLHDL, LDLDIRECT in the last 72 hours. Thyroid  Function Tests: No results for input(s): TSH, T4TOTAL, FREET4, T3FREE, THYROIDAB in the last 72 hours. Anemia Panel: No results for input(s): VITAMINB12, FOLATE, FERRITIN, TIBC, IRON , RETICCTPCT in the last 72 hours. Urine analysis:    Component Value Date/Time   COLORURINE YELLOW (A) 08/15/2024 0400   APPEARANCEUR CLEAR (A) 08/15/2024 0400   APPEARANCEUR Cloudy (A) 08/23/2017 1018   LABSPEC 1.032 (H) 08/15/2024 0400   PHURINE 5.0 08/15/2024 0400   GLUCOSEU NEGATIVE 08/15/2024 0400   HGBUR SMALL (A) 08/15/2024 0400   BILIRUBINUR NEGATIVE 08/15/2024 0400   BILIRUBINUR Negative 08/23/2017 1018   KETONESUR NEGATIVE 08/15/2024 0400   PROTEINUR NEGATIVE 08/15/2024 0400   NITRITE NEGATIVE 08/15/2024 0400   LEUKOCYTESUR NEGATIVE 08/15/2024 0400     Radiological Exams on Admission: I have personally reviewed images US  Venous Img Lower Bilateral (DVT) Result Date: 08/14/2024 CLINICAL DATA:  Fever. EXAM: BILATERAL LOWER EXTREMITY VENOUS DOPPLER ULTRASOUND TECHNIQUE: Gray-scale sonography with graded compression, as well as color Doppler and duplex ultrasound were performed to evaluate the lower extremity deep venous systems from the level of the common femoral vein and including the common femoral, femoral, profunda femoral, popliteal and calf veins including the posterior tibial, peroneal and gastrocnemius veins when visible. The superficial great saphenous vein was also interrogated. Spectral Doppler was utilized to evaluate flow at rest and with distal augmentation maneuvers in the common femoral, femoral and popliteal veins. COMPARISON:  October 23, 2015 FINDINGS: RIGHT LOWER EXTREMITY Common Femoral Vein: No evidence of thrombus. Normal compressibility, respiratory phasicity and response to augmentation. Saphenofemoral Junction: No evidence of thrombus. Normal compressibility and flow on color Doppler imaging. Profunda Femoral Vein: No evidence of thrombus. Normal compressibility and flow on color Doppler imaging. Femoral Vein: No evidence of thrombus. Normal compressibility, respiratory phasicity and response to augmentation. Popliteal Vein: No evidence of thrombus. Normal compressibility, respiratory phasicity and response to augmentation. Calf Veins: No evidence of thrombus. Normal compressibility and flow on color Doppler imaging. Superficial Great Saphenous Vein: No evidence of thrombus. Normal compressibility. Venous Reflux:  None. Other Findings:  None. LEFT LOWER EXTREMITY Common Femoral Vein: No evidence of thrombus. Normal compressibility, respiratory phasicity and response to augmentation. Saphenofemoral Junction: No evidence of thrombus. Normal compressibility and flow on color Doppler imaging. Profunda Femoral Vein: No evidence of  thrombus. Normal compressibility and flow on color Doppler imaging. Femoral Vein: No evidence of thrombus. Normal compressibility, respiratory phasicity and response to augmentation. Popliteal Vein: No evidence of thrombus. Normal compressibility, respiratory phasicity and response to augmentation. Calf Veins: No evidence of thrombus. Normal compressibility and flow on color Doppler imaging. Superficial Great Saphenous Vein: No evidence of thrombus. Normal compressibility. Venous Reflux:  None. Other Findings:  None. IMPRESSION: No evidence of deep venous thrombosis in either lower extremity. Electronically Signed   By: Suzen Dials M.D.   On: 08/14/2024 20:24   CT ABDOMEN PELVIS W CONTRAST Result Date: 08/14/2024 CLINICAL DATA:  Recent ostomy reversal. Febrile with worsening abdominal pain and distention. EXAM: CT ABDOMEN AND PELVIS WITH CONTRAST TECHNIQUE: Multidetector CT imaging of the abdomen and pelvis was performed using the standard protocol following bolus administration of intravenous contrast. RADIATION DOSE REDUCTION: This exam was performed according to the departmental dose-optimization program which includes automated exposure control, adjustment of the mA and/or kV according to patient size and/or use of iterative reconstruction technique. CONTRAST:  OMNIPAQUE  IOHEXOL  300 MG/ML  SOLN COMPARISON:  Barium enema 07/16/2024. CT abdomen and pelvis 07/12/2024 FINDINGS: Lower chest: Small bilateral pleural effusions with bilateral basilar atelectasis or infiltration. Hepatobiliary: No focal liver abnormality is seen. No gallstones, gallbladder wall thickening, or biliary dilatation. Pancreas: Unremarkable. No pancreatic ductal dilatation or surrounding inflammatory changes. Spleen: Normal in size without focal abnormality. Adrenals/Urinary Tract: Adrenal glands are unremarkable. Kidneys are normal, without renal calculi, focal lesion, or hydronephrosis. Bladder is unremarkable. Stomach/Bowel:  Stomach, small bowel, and colon are not abnormally distended. Right lower quadrant small bowel anastomosis. Mild stranding pericolonic fat adjacent to the anastomosis with some adjacent non loculated fluid. No loculated collections are identified. In the setting of recent surgery, this could represent postoperative reaction although infection or anastomotic dehiscence are not excluded. Follow-up as clinically indicated. Liquid stool is demonstrated in the colon. Scattered diverticula without evidence of acute diverticulitis. Vascular/Lymphatic: Aortic atherosclerosis. No enlarged abdominal or pelvic lymph nodes. Reproductive: Prostate gland is enlarged, measuring 6 cm diameter. Other: Small amount of free fluid along the pericolic gutters and in the pelvis, likely reactive or postoperative. No free air. Skin clips in the right lower quadrant with underlying stranding in the subcutaneous fat. A small collection with air-fluid level measuring 2.7 cm is present in the subcutaneous fat at the incision. This likely represents postoperative collection but abscess is not excluded. Minimal periumbilical hernia containing fat. Small intramuscular lipomas in the left flank musculature. Musculoskeletal: Degenerative changes in the spine. No acute bony abnormalities. IMPRESSION: 1. Interval right lower quadrant small bowel anastomosis. Stranding, edema, and free fluid adjacent to the anastomotic segment is probably postoperative but infection or anastomotic dehiscence are not excluded by CT. Follow-up suggested as clinically indicated. No loculated collections are seen. 2. Skin staples in the right lower quadrant with underlying subcutaneous soft tissue infiltration and small collection with air-fluid level. This is likely postoperative collection but abscess is not excluded. 3. Small amount of free fluid in the abdomen and pelvis are likely reactive. No free air. 4. Small bilateral pleural effusions with basilar atelectasis or  consolidation. 5. Aortic atherosclerosis. Electronically Signed   By: Elsie Gravely M.D.   On: 08/14/2024 18:37    EKG: My personal interpretation of EKG shows: Sinus rhythm at 95 bpm    Assessment/Plan Principal Problem:   Fever Active Problems:   Postprocedural fever    Assessment and Plan:  83 year old male with a recent ileostomy takedown procedure 5 days ago came in for a fever.  1.  Fever - Patient denies any cough, dysuria or severe abdominal discomfort. - His white count is normal - It appears that he does not have a UTI, pneumonia or severe diarrhea concerning for C. difficile. - CT scan is negative for acute abscess in the abdomen canal cavity - I noted  one  thing that patient has a history of hide T4, will check TSH and free T4 - Surgical service already started empiric antibiotic with Zosyn . - 2 sets of blood cultures are pending - It could be either atelectasis/pneumonia even though it does not show in the imaging or some intra-abdominal process.  But currently does not appear to be obvious. - The other possibility could be C. difficile colitis, will send stool for C. difficile - I would encourage him to use incentive spirometry - If he continues to have fever then we can look for rheumatological conditions.  2.  Surgical wound in the abdomen - It does not appear to be grossly infected - Further plan per surgical team  3.  Chronic medical conditions including HTN HLD gout PE - Resume home medications  Thank you for the opportunity to get involved in the care of this patient.  Hospitalist team will continue to follow-up while he is in the hospital.   Addendum:  Patient still positive for C. difficile.  Will start him on fidaxomicin  for C. difficile colitis.   DVT prophylaxis: Eliquis  Code Status: Full Code    Nena Rebel, MD [facility] 08/15/2024, 2:41 PM

## 2024-08-15 NOTE — Plan of Care (Signed)
  Problem: Education: Goal: Knowledge of General Education information will improve Description: Including pain rating scale, medication(s)/side effects and non-pharmacologic comfort measures Outcome: Progressing   Problem: Activity: Goal: Risk for activity intolerance will decrease Outcome: Progressing   Problem: Coping: Goal: Level of anxiety will decrease Outcome: Progressing   Problem: Elimination: Goal: Will not experience complications related to bowel motility Outcome: Progressing Goal: Will not experience complications related to urinary retention Outcome: Progressing   Problem: Pain Managment: Goal: General experience of comfort will improve and/or be controlled Outcome: Progressing   Problem: Safety: Goal: Ability to remain free from injury will improve Outcome: Progressing

## 2024-08-15 NOTE — Consult Note (Signed)
 Pharmacy Consult Note - Anticoagulation  Pharmacy Consult for Heparin  Indication: atrial fibrillation  VITAL SIGNS: Temp: 98.7 F (37.1 C) (08/28 1523) Temp Source: Oral (08/28 1523) BP: 124/60 (08/28 1527) Pulse Rate: 63 (08/28 1527)  Recent Labs    08/15/24 0429 08/15/24 1517  HGB 9.0*  --   HCT 27.2*  --   PLT 166  --   APTT 64* 104*  HEPARINUNFRC >1.10*  --   CREATININE 1.41*  --     Estimated Creatinine Clearance: 39.7 mL/min (A) (by C-G formula based on SCr of 1.41 mg/dL (H)).  PAST MEDICAL HISTORY: Past Medical History:  Diagnosis Date   Actinic keratosis    Acute ST elevation myocardial infarction (STEMI) of anterior wall (HCC) 01/06/2005   a.) PCI 04/06/2005:100% mLAD (3.0 x 23 mm Cypher)   Adenomatous colon polyp    Anginal pain (HCC)    Aortic atherosclerosis (HCC)    Arthritis    Asthma    Basal cell carcinoma 03/17/2020   right ant lat deltoid   CAD (coronary artery disease)    a.) Anterolateral STEMI 01/06/2005: 100% mLAD (3.0 x 23 mm Cypher); b.) LHC/PCI  03/30/2009: 90% pLAD (3.0 x 18 mm Xience DES); c.) LHC/PCI 06/03/2021: 90% LAD (PTCA), 70% pLCx (3.5 x 12 mm DES - unk type), 70% OM (2.5 x 18 mm DES - unk type)   Cholelithiasis    COPD (chronic obstructive pulmonary disease) (HCC) 07/03/2017   DDD (degenerative disc disease), thoracolumbar    Dyspnea    Enlarged prostate    Esophageal stricture s/p dilation    GERD (gastroesophageal reflux disease)    Gout    Hearing loss 03/09/2013   Hemorrhoids    HFrEF (heart failure with reduced ejection fraction) (HCC)    Hiatal hernia    Hyperlipidemia, unspecified 07/03/2017   Hypertension 02/13/2012   Ileus due to infection [facility])    a.) s/p ileostomy creation 03/20/2024   Nephrolithiasis    NICM (nonischemic cardiomyopathy) (HCC)    On apixaban  therapy    Parastomal hernia    Pneumonia    Prostate mass 07/09/2024   a.) 2.7 cm PZ lesion in the LEFTposteromedial and posterolateral mid gland -  highly suspicious for high-grade carcinoma   Pulmonary embolism (HCC) 03/2024   Right-sided low back pain without sciatica 04/14/2016   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   R ear - ED&C   Status post coronary artery stent placement    a.) TOTAL # STENTS: 4 as of 08/08/2024   Thyroid  nodule    Unintentional weight loss    a.) down 40+ lbs between 02/2024 - 05/2024 --> associated with sepsis, colonic ileus (odor associated with mantainence), and metallic dysgeusia.    ASSESSMENT: 83 y.o. male with PMH of A.fib. CAD, CHF, COPD, and HTN s/p ileostomy takedown 5 days ago and is presenting with NVD and increased fevers. US  Negative for DVT on 8/27. Patient is on Eliquis  for A.fib per chart review. RN confirmed with the patient that he took his last dose @ 11AM on 8/27. Pharmacy has been consulted to initiate and manage heparin  intravenous infusion.   Baseline labs: Heparin  level >1.10, aPTT 35. Hgb and Plts stable. Baseline heparin  level elevated due to home DOAC use.   Goal(s) of therapy: Heparin  level 0.3 - 0.7 units/mL aPTT 66 - 102 seconds Monitor platelets by anticoagulation protocol: Yes   Baseline anticoagulation labs: Recent Labs    08/14/24 1726 08/14/24 2108 08/15/24 0429 08/15/24 1517  APTT  --  35 64* 104*  HGB 10.8*  --  9.0*  --   PLT 184  --  166  --      0828 0429 aPTT 64, HL>1.10; 1150 un/hr 0828 1517 aPTT 104; 1300 un/hr  PLAN: aPTT supratherapeutic, not correlating with HL currently Decrease heparin  infusion rate to 1250 units/hr Check aPTT in 8 hours after rate change and heparin  level daily Continue to titrate by aPTT until heparin  level and aPTT correlate and/or  washes out, then titrate by heparin  level alone. Continue to monitor CBC daily while on heparin  infusion.   Marolyn KATHEE Mare 08/15/2024 4:07 PM

## 2024-08-15 NOTE — Plan of Care (Signed)
   Problem: Education: Goal: Knowledge of General Education information will improve Description: Including pain rating scale, medication(s)/side effects and non-pharmacologic comfort measures Outcome: Progressing   Problem: Clinical Measurements: Goal: Ability to maintain clinical measurements within normal limits will improve Outcome: Progressing

## 2024-08-15 NOTE — Progress Notes (Signed)
 [facility] SUR[facility]ICAL PROGRESS NOTE  Hospital Day(s): 1.   Post op day(s): 6   Interval History:  Patient seen and examined No acute events or new complaints overnight.  Last fever 100.18F at 2100 Patient reports he is feeling okay this morning He denied any significant abdominal pain He does feel somewhat distended No nausea, emesis, CP, SOB, congestion  Previous leukocytosis has resolved; WBC 9.4K Hgb to 9.0; likely dilutional changes Renal function baseline; sCr - 1.41; UO - unmeasured No significant electrolyte derangements He is NPO this AM He continues on Zosyn    Vital signs in last 24 hours: [min-max] current  Temp:  [98.4 F (36.9 C)-102.7 F (39.3 C)] 99.6 F (37.6 C) (08/28 0504) Pulse Rate:  [73-109] 81 (08/28 0504) Resp:  [15-28] 20 (08/28 0504) BP: (104-137)/(53-82) 112/65 (08/28 0504) SpO2:  [93 %-97 %] 97 % (08/28 0504) Weight:  [81.6 kg] 81.6 kg (08/27 1900)     Height: 5' 9 (175.3 cm) Weight: 81.6 kg BMI (Calculated): 26.55   Intake/Output last 2 shifts:  08/27 0701 - 08/28 0700 In: 1516.1 [I.V.:483.6; IV Piggyback:1032.5] Out: -    Physical Exam:  Constitutional: alert, cooperative and no distress  Respiratory: breathing non-labored at rest  Cardiovascular: regular rate and sinus rhythm  Gastrointestinal: soft, he does not appear overtly tender, he is slightly distended and tympanic, no rebound/guarding. He is not grossly peritonitic Integumentary: Ileostomy incisions is CDI with staples, no erythema   Labs:     Latest Ref Rng & Units 08/15/2024    4:29 AM 08/14/2024    5:26 PM 08/12/2024    5:43 AM  CBC  WBC 4.0 - 10.5 K/uL 9.4  11.1  8.2   Hemoglobin 13.0 - 17.0 g/dL 9.0  89.1  9.3   Hematocrit 39.0 - 52.0 % 27.2  34.3  29.9   Platelets 150 - 400 K/uL 166  184  161       Latest Ref Rng & Units 08/15/2024    4:29 AM 08/14/2024    5:26 PM 08/12/2024    5:43 AM  CMP  Glucose 70 - 99 mg/dL 892  881  95   BUN 8 - 23 mg/dL 17   20  24    Creatinine 0.61 - 1.24 mg/dL 8.58  8.65  8.30   Sodium 135 - 145 mmol/L 139  142  140   Potassium 3.5 - 5.1 mmol/L 3.6  3.8  3.9   Chloride 98 - 111 mmol/L 107  104  110   CO2 22 - 32 mmol/L 26  24  25    Calcium  8.9 - 10.3 mg/dL 8.1  8.8  8.4   Total Protein 6.5 - 8.1 g/dL 5.3  6.2    Total Bilirubin 0.0 - 1.2 mg/dL 0.8  0.8    Alkaline Phos 38 - 126 U/L 32  40    AST 15 - 41 U/L <10  13    ALT 0 - 44 U/L 8  13       Imaging studies: No new pertinent imaging studies   Assessment/Plan:  83 y.o. male admitted with fever of unknown origin 6 days s/p ileostomy takedown and parastomal hernia repair   - Source of fever remains unclear. Imaging does noyt show any concrete evidence of post-operative abscess nor anastomotic leak. I will add respiratory panel and COVID swab to reassess for potential source. He was initially admitted back in April/May for fever of unknown origin which lead to his initial surgery. I  do not think a clear source was ever found at that time.   - For now, we can continue NPO - Okay for small sips of liquid throughout the day - Likely will repeat CT tomorrow (08/29) to ensure no missed intra-abdominal source   - Abx (Zosyn ) as precaution   - Monitor abdominal examination; on-going bowel function - Pain control prn; antiemetics prn - I will ask medicine for their input as well  All of the above findings and recommendations were discussed with the patient, patient's family (wife at bedside), and the medical team, and all of patient's and family's questions were answered to their expressed satisfaction.  -- Arthea Platt, PA-C [facility] 08/15/2024, 7:38 AM M-F: 7am - 4pm

## 2024-08-15 NOTE — Consult Note (Signed)
 Pharmacy IV Iron  Note  Todd Rojas is a 83 y.o. male admitted on 08/14/2024 with fever.  Patient has history of anemia and is currently NPO. Pharmacy has been consulted to review IV iron  sucrose order.  Plan: IV iron  sucrose 300mg  over 1.5h ordered x1, for IDA.   Height: 5' 9 (175.3 cm) Weight: 81.6 kg (179 lb 14.3 oz) IBW/kg (Calculated) : 70.7  Temp (24hrs), Avg:100.5 F (38.1 C), Min:98.4 F (36.9 C), Max:102.7 F (39.3 C)  Recent Labs  Lab 08/10/24 0455 08/12/24 0543 08/14/24 1726 08/14/24 2108 08/15/24 0429  WBC 12.5* 8.2 11.1*  --  9.4  CREATININE 1.26* 1.69* 1.34*  --  1.41*  LATICACIDVEN  --   --  1.2 1.5  --     Estimated Creatinine Clearance: 39.7 mL/min (A) (by C-G formula based on SCr of 1.41 mg/dL (H)).    No Known Allergies  Thank you for allowing pharmacy to be a part of this patient's care.  Todd Rojas Todd Rojas 08/15/2024 3:04 PM

## 2024-08-16 ENCOUNTER — Telehealth (HOSPITAL_COMMUNITY): Payer: Self-pay

## 2024-08-16 ENCOUNTER — Inpatient Hospital Stay

## 2024-08-16 ENCOUNTER — Other Ambulatory Visit (HOSPITAL_COMMUNITY): Payer: Self-pay

## 2024-08-16 DIAGNOSIS — A0472 Enterocolitis due to Clostridium difficile, not specified as recurrent: Secondary | ICD-10-CM | POA: Diagnosis not present

## 2024-08-16 DIAGNOSIS — R5082 Postprocedural fever: Secondary | ICD-10-CM | POA: Diagnosis not present

## 2024-08-16 LAB — CBC
HCT: 28.7 % — ABNORMAL LOW (ref 39.0–52.0)
Hemoglobin: 9 g/dL — ABNORMAL LOW (ref 13.0–17.0)
MCH: 32.3 pg (ref 26.0–34.0)
MCHC: 31.4 g/dL (ref 30.0–36.0)
MCV: 102.9 fL — ABNORMAL HIGH (ref 80.0–100.0)
Platelets: 147 K/uL — ABNORMAL LOW (ref 150–400)
RBC: 2.79 MIL/uL — ABNORMAL LOW (ref 4.22–5.81)
RDW: 15.2 % (ref 11.5–15.5)
WBC: 7.4 K/uL (ref 4.0–10.5)
nRBC: 0 % (ref 0.0–0.2)

## 2024-08-16 LAB — TSH: TSH: 1.064 u[IU]/mL (ref 0.350–4.500)

## 2024-08-16 LAB — HEPARIN LEVEL (UNFRACTIONATED): Heparin Unfractionated: 0.89 [IU]/mL — ABNORMAL HIGH (ref 0.30–0.70)

## 2024-08-16 LAB — APTT
aPTT: >200 s (ref 24–36)
aPTT: 99 s — ABNORMAL HIGH (ref 24–36)

## 2024-08-16 LAB — T4, FREE: Free T4: 0.69 ng/dL (ref 0.61–1.12)

## 2024-08-16 MED ORDER — APIXABAN 5 MG PO TABS
5.0000 mg | ORAL_TABLET | Freq: Two times a day (BID) | ORAL | Status: DC
  Administered 2024-08-16 – 2024-08-18 (×5): 5 mg via ORAL
  Filled 2024-08-16 (×6): qty 1

## 2024-08-16 MED ORDER — VANCOMYCIN HCL 125 MG PO CAPS
125.0000 mg | ORAL_CAPSULE | Freq: Four times a day (QID) | ORAL | Status: DC
  Administered 2024-08-16 – 2024-08-18 (×8): 125 mg via ORAL
  Filled 2024-08-16 (×9): qty 1

## 2024-08-16 MED ORDER — IOHEXOL 9 MG/ML PO SOLN
500.0000 mL | ORAL | Status: AC
  Administered 2024-08-16 (×2): 500 mL via ORAL

## 2024-08-16 MED ORDER — HEPARIN (PORCINE) 25000 UT/250ML-% IV SOLN
950.0000 [IU]/h | INTRAVENOUS | Status: DC
  Administered 2024-08-16: 950 [IU]/h via INTRAVENOUS

## 2024-08-16 NOTE — Consult Note (Signed)
 Pharmacy Consult Note - Anticoagulation  Pharmacy Consult for Heparin  Indication: atrial fibrillation  VITAL SIGNS: Temp: 98.2 F (36.8 C) (08/29 0312) Temp Source: Oral (08/29 0312) BP: 125/66 (08/29 0312) Pulse Rate: 68 (08/29 0312)  Recent Labs    08/15/24 1517 08/16/24 0136  HGB  --  9.0*  HCT  --  28.7*  PLT  --  147*  APTT 104* >200*  HEPARINUNFRC  --  0.89*  CREATININE 1.64*  --     Estimated Creatinine Clearance: 34.1 mL/min (A) (by C-G formula based on SCr of 1.64 mg/dL (H)).  PAST MEDICAL HISTORY: Past Medical History:  Diagnosis Date   Actinic keratosis    Acute ST elevation myocardial infarction (STEMI) of anterior wall (HCC) 01/06/2005   a.) PCI 04/06/2005:100% mLAD (3.0 x 23 mm Cypher)   Adenomatous colon polyp    Anginal pain (HCC)    Aortic atherosclerosis (HCC)    Arthritis    Asthma    Basal cell carcinoma 03/17/2020   right ant lat deltoid   CAD (coronary artery disease)    a.) Anterolateral STEMI 01/06/2005: 100% mLAD (3.0 x 23 mm Cypher); b.) LHC/PCI  03/30/2009: 90% pLAD (3.0 x 18 mm Xience DES); c.) LHC/PCI 06/03/2021: 90% LAD (PTCA), 70% pLCx (3.5 x 12 mm DES - unk type), 70% OM (2.5 x 18 mm DES - unk type)   Cholelithiasis    COPD (chronic obstructive pulmonary disease) (HCC) 07/03/2017   DDD (degenerative disc disease), thoracolumbar    Dyspnea    Enlarged prostate    Esophageal stricture s/p dilation    GERD (gastroesophageal reflux disease)    Gout    Hearing loss 03/09/2013   Hemorrhoids    HFrEF (heart failure with reduced ejection fraction) (HCC)    Hiatal hernia    Hyperlipidemia, unspecified 07/03/2017   Hypertension 02/13/2012   Ileus due to infection [facility])    a.) s/p ileostomy creation 03/20/2024   Nephrolithiasis    NICM (nonischemic cardiomyopathy) (HCC)    On apixaban  therapy    Parastomal hernia    Pneumonia    Prostate mass 07/09/2024   a.) 2.7 cm PZ lesion in the LEFTposteromedial and posterolateral mid gland -  highly suspicious for high-grade carcinoma   Pulmonary embolism (HCC) 03/2024   Right-sided low back pain without sciatica 04/14/2016   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   R ear - ED&C   Status post coronary artery stent placement    a.) TOTAL # STENTS: 4 as of 08/08/2024   Thyroid  nodule    Unintentional weight loss    a.) down 40+ lbs between 02/2024 - 05/2024 --> associated with sepsis, colonic ileus (odor associated with mantainence), and metallic dysgeusia.    ASSESSMENT: 83 y.o. male with PMH of A.fib. CAD, CHF, COPD, and HTN s/p ileostomy takedown 5 days ago and is presenting with NVD and increased fevers. US  Negative for DVT on 8/27. Patient is on Eliquis  for A.fib per chart review. RN confirmed with the patient that he took his last dose @ 11AM on 8/27. Pharmacy has been consulted to initiate and manage heparin  intravenous infusion.   Baseline labs: Heparin  level >1.10, aPTT 35. Hgb and Plts stable. Baseline heparin  level elevated due to home DOAC use.   Goal(s) of therapy: Heparin  level 0.3 - 0.7 units/mL aPTT 66 - 102 seconds Monitor platelets by anticoagulation protocol: Yes   Baseline anticoagulation labs: Recent Labs    08/14/24 1726 08/14/24 2108 08/15/24 0429 08/15/24 1517 08/16/24  0136  APTT  --    < > 64* 104* >200*  HGB 10.8*  --  9.0*  --  9.0*  PLT 184  --  166  --  147*   < > = values in this interval not displayed.     0828 0429 aPTT 64, HL>1.10; 1150 un/hr 0828 1517 aPTT 104; 1300 un/hr 0829 0136 aPTT>200; 1250 un/hr  PLAN: aPTT supratherapeutic, not correlating with HL currently Hold heparin  infusion for~1 hour, and resume at lower rate of 950 units/hr Check aPTT in 8 hours after rate change and heparin  level daily Continue to titrate by aPTT until heparin  level and aPTT correlate and/or  washes out, then titrate by heparin  level alone. Continue to monitor CBC daily while on heparin  infusion.   Kimblery Diop A Meka Lewan, PharmD Clinical  Pharmacist 08/16/2024 3:45 AM

## 2024-08-16 NOTE — Telephone Encounter (Signed)
 Pharmacy Patient Advocate Encounter  Insurance verification completed.    The patient is insured through [employer]. Patient has Medicare and is not eligible for a copay card, but may be able to apply for patient assistance or Medicare RX Payment Plan (Patient Must reach out to their plan, if eligible for payment plan), if available.    Ran test claim for Vancomycin  and the current 30 day co-pay is $22.90.   This test claim was processed through [facility]- copay amounts may vary at other pharmacies due to pharmacy/plan contracts, or as the patient moves through the different stages of their insurance plan.

## 2024-08-16 NOTE — Progress Notes (Signed)
 [facility] SURGICAL PROGRESS NOTE  Hospital Day(s): 2.   Post op day(s): 7  Interval History:  Patient seen and examined No acute events or new complaints overnight.  No fever in last 24 hours  Patient reports he is feeling better His abdominal pain is improved Still with diarrhea No nausea/emesis  No nausea, emesis, CP, SOB, congestion  Without leukocytosis x 48 hours; WBC 7.4K Hgb to 9.0; stable in last 24 hours He was found positive for C diff COVID & respiratory panel negative  Now on Fidaxomicin  NPO  Vital signs in last 24 hours: [min-max] current  Temp:  [98.2 F (36.8 C)-98.7 F (37.1 C)] 98.2 F (36.8 C) (08/29 0312) Pulse Rate:  [60-68] 68 (08/29 0312) Resp:  [16-20] 16 (08/29 0312) BP: (96-125)/(60-66) 125/66 (08/29 0312) SpO2:  [94 %-95 %] 94 % (08/29 0312)     Height: 5' 9 (175.3 cm) Weight: 81.6 kg BMI (Calculated): 26.55   Intake/Output last 2 shifts:  08/28 0701 - 08/29 0700 In: -  Out: 150 [Urine:150]   Physical Exam:  Constitutional: alert, cooperative and no distress  Respiratory: breathing non-labored at rest  Cardiovascular: regular rate and sinus rhythm  Gastrointestinal: soft, he does not appear overtly tender, distension appears improved from yesterday, no rebound/guarding. He is not grossly peritonitic Integumentary: Ileostomy incisions is CDI with staples, no erythema   Labs:     Latest Ref Rng & Units 08/16/2024    1:36 AM 08/15/2024    4:29 AM 08/14/2024    5:26 PM  CBC  WBC 4.0 - 10.5 K/uL 7.4  9.4  11.1   Hemoglobin 13.0 - 17.0 g/dL 9.0  9.0  89.1   Hematocrit 39.0 - 52.0 % 28.7  27.2  34.3   Platelets 150 - 400 K/uL 147  166  184       Latest Ref Rng & Units 08/15/2024    3:17 PM 08/15/2024    4:29 AM 08/14/2024    5:26 PM  CMP  Glucose 70 - 99 mg/dL 98  892  881   BUN 8 - 23 mg/dL 19  17  20    Creatinine 0.61 - 1.24 mg/dL 8.35  8.58  8.65   Sodium 135 - 145 mmol/L 139  139  142   Potassium 3.5 - 5.1 mmol/L  3.7  3.6  3.8   Chloride 98 - 111 mmol/L 105  107  104   CO2 22 - 32 mmol/L 26  26  24    Calcium  8.9 - 10.3 mg/dL 8.1  8.1  8.8   Total Protein 6.5 - 8.1 g/dL 5.4  5.3  6.2   Total Bilirubin 0.0 - 1.2 mg/dL 0.9  0.8  0.8   Alkaline Phos 38 - 126 U/L 33  32  40   AST 15 - 41 U/L 18  <10  13   ALT 0 - 44 U/L 9  8  13       Imaging studies:   CT Abdomen/Pelvis (08/29) personally reviewed without gross evidence of anastomotic leak, PO contrast traverses the anastomosis and reach colon, no evidence of extravasation, no specific inflammatory changes and fluid around anastomosis likely post-surgical in nature, no free air, and radiologist report reviewed:  IMPRESSION: 1. Postsurgical changes in the right lower quadrant, as described above. No evidence of anastomotic leak. No walled-off abscess. 2. There is new small amount of fluid along the subcapsular inferior right hepatic lobe, perisplenic region and along the right paracolic gutter. There is  also new mild presacral edema and fluid. Findings are nonspecific and can be seen with fluid overload. 3. Small right pleural effusion with associated segmental atelectatic changes in the right lung lower lobe and trace left pleural effusion, increased since the prior study. 4. Multiple other nonacute observations, as described above.   Assessment/Plan:  83 y.o. male admitted with fever, now positive for C diff, 7 days s/p ileostomy takedown and parastomal hernia repair   - CT this morning is reassuring and without evidence of anastomotic leak; No need for surgical re-intervention  - I do think we can start with CLD today  - Continue Fidaxomicin   - Monitor abdominal examination; on-going bowel function - Pain control prn; antiemetics prn - Appreciate medicine assistance   All of the above findings and recommendations were discussed with the patient, patient's family (wife at bedside), and the medical team, and all of patient's and family's  questions were answered to their expressed satisfaction.  -- Arthea Platt, PA-C [facility] 08/16/2024, 9:20 AM M-F: 7am - 4pm

## 2024-08-16 NOTE — Progress Notes (Signed)
  Hospitalist consult daily PROGRESS NOTE    Todd Rojas  FMW:[id number] DOB: Nov 29, 1941 DOA: 08/14/2024 PCP: Sadie Manna, MD  [id number]  LOS: 2 days   Brief hospital course:   Assessment & Plan: Todd Rojas is a pleasant 83 y.o. 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  Patient was admitted to surgical service.  After workup was negative including chest x-ray, urine analysis, viral panel and CT abdomen and pelvis without any abscess, hospitalist service was consulted for evaluation for consultation to assist with fever of unknown origin.    1.  Fever 2/2 C diff colitis --antigen and toxin pos.  Started on Fidaximicin. --switch to oral vanc today, since pt's insurance doesn't cover fidaximicin outpatient.  2.  Surgical wound in the abdomen --CT a/p showed no leak. 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  Hx of PE --cont eliquis   COPD --stable    DVT prophylaxis: On:Eliquis  Code Status: Full code  Family Communication:  Level of care: Med-Surg Dispo:   The patient is from: home Anticipated d/c is to: home Anticipated d/c date is: 1-2 days   Subjective and Interval History:  Pt reported 1 episode of loose stool today.   Objective: Vitals:   08/15/24 1523 08/15/24 1527 08/15/24 2012 08/16/24 0312  BP: 96/60 124/60 107/61 125/66  Pulse: 60 63 61 68  Resp: 18  20 16   Temp: 98.7 F (37.1 C)  98.5 F (36.9 C) 98.2 F (36.8 C)  TempSrc: Oral  Oral Oral  SpO2: 95% 95% 95% 94%  Weight:      Height:        Intake/Output Summary (Last 24 hours) at 08/16/2024 1739 Last data filed at 08/15/2024 2109 Gross per 24 hour  Intake --  Output 150 ml  Net -150 ml   Filed Weights    08/14/24 1900  Weight: 81.6 kg    Examination:   Constitutional: NAD, AAOx3 HEENT: conjunctivae and lids normal, EOMI CV: No cyanosis.   RESP: normal respiratory effort, on RA Neuro: II - XII grossly intact.   Psych: Normal mood and affect.  Appropriate judgement and reason   Data Reviewed: I have personally reviewed labs and imaging studies  Time spent: 50 minutes  Ellouise Haber, MD [facility] If 7PM-7AM, please contact night-coverage 08/16/2024, 5:39 PM

## 2024-08-16 NOTE — TOC CM/SW Note (Signed)
 Transition of Care [facility]) - Inpatient Brief Assessment   Patient Details  Name: Todd Rojas MRN: [id number] Date of Birth: 1941-03-30  Transition of Care [facility]) CM/SW Contact:    Corean ONEIDA Haddock, RN Phone Number: 08/16/2024, 9:54 AM   Clinical Narrative:  Transition of Care [facility]) Screening Note   Patient Details  Name: Todd Rojas Date of Birth: 11/18/1941   Transition of Care [facility]) CM/SW Contact:    Corean ONEIDA Haddock, RN Phone Number: 08/16/2024, 9:54 AM    Transition of Care Department [facility]) has reviewed patient and no TOC needs have been identified at this time. If new patient transition needs arise, please place a TOC consult.     Transition of Care Asessment: Insurance and Status: Insurance coverage has been reviewed Patient has primary care physician: Yes     Prior/Current Home Services: No current home services Social Drivers of Health Review: SDOH reviewed no interventions necessary Readmission risk has been reviewed: Yes Transition of care needs: no transition of care needs at this time

## 2024-08-16 NOTE — Care Management Important Message (Signed)
 Important Message  Patient Details  Name: Todd Rojas MRN: [id number] Date of Birth: January 27, 1941   Important Message Given:  Yes - Medicare IM     [name] 08/16/2024, 4:08 PM

## 2024-08-16 NOTE — Plan of Care (Signed)
  Problem: Education: Goal: Knowledge of General Education information will improve Description: Including pain rating scale, medication(s)/side effects and non-pharmacologic comfort measures Outcome: Progressing   Problem: Health Behavior/Discharge Planning: Goal: Ability to manage health-related needs will improve Outcome: Progressing   Problem: Clinical Measurements: Goal: Ability to maintain clinical measurements within normal limits will improve Outcome: Progressing Goal: Will remain free from infection Outcome: Progressing Goal: Diagnostic test results will improve Outcome: Progressing Goal: Respiratory complications will improve Outcome: Progressing Goal: Cardiovascular complication will be avoided Outcome: Progressing   Problem: Activity: Goal: Risk for activity intolerance will decrease Outcome: Progressing   Problem: Coping: Goal: Level of anxiety will decrease Outcome: Progressing   Problem: Nutrition: Goal: Adequate nutrition will be maintained Outcome: Progressing   Problem: Elimination: Goal: Will not experience complications related to bowel motility Outcome: Progressing Goal: Will not experience complications related to urinary retention Outcome: Progressing   

## 2024-08-16 NOTE — Progress Notes (Signed)
 Pharmacy - fidaxomicin  and vancomycin  capsules for C. Difficile  Benefit check performed by pharmacy technician revealed that fidaxomicin  was non formulary and vancomycin  125mg  caps was $22.90  for 10 day course.    Plan Discussed above with Dr Awanda and since vancomycin  appropriate for C. Difficile (especially with first occurrence), change fidaxomicin  to oral vancomycin   Celestine Slovak, PharmD, BCPS, BCIDP Work Cell: [phone] 08/16/2024 12:06 PM

## 2024-08-17 DIAGNOSIS — A0472 Enterocolitis due to Clostridium difficile, not specified as recurrent: Secondary | ICD-10-CM | POA: Diagnosis not present

## 2024-08-17 LAB — CREATININE, SERUM
Creatinine, Ser: 1.5 mg/dL — ABNORMAL HIGH (ref 0.61–1.24)
GFR, Estimated: 46 mL/min — ABNORMAL LOW (ref >60)

## 2024-08-17 NOTE — Progress Notes (Signed)
 08/17/2024  Subjective: No acute events overnight.  Patient reports today that he is feeling better than he did on admission.  His abdomen is less tender.  He is still having diarrhea but has been less frequent.  His white blood cell count is normal at 7.4.  Hemoglobin is stable at 9.  He was transition back to Eliquis .  Vital signs: Temp:  [97.5 F (36.4 C)-98.2 F (36.8 C)] 97.7 F (36.5 C) (08/30 0757) Pulse Rate:  [52-63] 52 (08/30 0757) Resp:  [16-20] 18 (08/30 0757) BP: (115-127)/(61-69) 127/69 (08/30 0757) SpO2:  [95 %] 95 % (08/30 0757)   Intake/Output: 08/29 0701 - 08/30 0700 In: -  Out: 250 [Urine:250] Last BM Date : 08/16/24 (per patient)  Physical Exam: Constitutional: No acute distress Abdomen: Soft, nondistended, with some soreness to palpation.  No peritonitis.  Labs:  Recent Labs    08/15/24 0429 08/16/24 0136  WBC 9.4 7.4  HGB 9.0* 9.0*  HCT 27.2* 28.7*  PLT 166 147*   Recent Labs    08/15/24 0429 08/15/24 1517 08/17/24 0652  NA 139 139  --   K 3.6 3.7  --   CL 107 105  --   CO2 26 26  --   GLUCOSE 107* 98  --   BUN 17 19  --   CREATININE 1.41* 1.64* 1.50*  CALCIUM  8.1* 8.1*  --    No results for input(s): LABPROT, INR in the last 72 hours.  Imaging: No results found.  Assessment/Plan: [name] with C. difficile colitis.  - The patient has been making clinical improvements with the treatment for his C. difficile colitis.  Discussed with patient that this can explain the discomfort that he was having, the diarrhea, the fevers, and the findings on the CT scan. - He has been tolerating a full liquid diet and we will advance him today to a soft diet. - If he continues doing well, we could potentially discharge him home tomorrow with a course of oral vancomycin .   I spent 35 minutes dedicated to the care of this patient on the date of this encounter to include pre-visit review of records, face-to-face time with the patient  discussing diagnosis and management, and any post-visit coordination of care.  Aloysius Sheree Plant, MD [facility]

## 2024-08-17 NOTE — Plan of Care (Signed)

## 2024-08-18 DIAGNOSIS — A0472 Enterocolitis due to Clostridium difficile, not specified as recurrent: Secondary | ICD-10-CM | POA: Diagnosis not present

## 2024-08-18 MED ORDER — VANCOMYCIN HCL 125 MG PO CAPS
125.0000 mg | ORAL_CAPSULE | Freq: Four times a day (QID) | ORAL | 0 refills | Status: AC
Start: 2024-08-18 — End: 2024-08-26

## 2024-08-18 NOTE — Discharge Summary (Addendum)
 Patient ID: Todd Rojas MRN: [id number] DOB/AGE: September 11, 1941 83 y.o.  Admit date: 08/14/2024 Discharge date: 08/18/2024   Discharge Diagnoses:  Principal Problem:   Fever Active Problems:   Postprocedural fever   C. difficile colitis   Procedures:  None  Hospital Course:  Patient was admitted on 08/14/24 with weakness, fevers, and diarrhea. CT scan did not show any complications from his prior ileostomy reversal.  His stool tested positive for C difficile.  He was initially started on Fidaxomicin , but his insurance would not cover it, so he was transitioned to oral vancomycin .  His diarrhea and abdominal pain have improved and he's tolerating a soft diet.  On exam, his abdomen is soft, non-distended, appropriately sore to palpation.  RLQ ileostomy reversal incision is healing well and is clean, dry, intact, with staples in place.  He is ready for discharge home today.  He will follow up in office on 08/21/24 for staple removal.  Consults: Hospitalist team  Disposition: Discharge disposition: 01-Home or Self Care       Discharge Instructions     Call MD for:  difficulty breathing, headache or visual disturbances   Complete by: As directed    Call MD for:  persistant nausea and vomiting   Complete by: As directed    Call MD for:  redness, tenderness, or signs of infection (pain, swelling, redness, odor or green/yellow discharge around incision site)   Complete by: As directed    Call MD for:  severe uncontrolled pain   Complete by: As directed    Call MD for:  temperature >100.4   Complete by: As directed    Diet general   Complete by: As directed    Home diet as tolerated   Discharge instructions   Complete by: As directed    1.  Patient may shower, but do not scrub wounds heavily and dab dry only. 2.  Do not submerge wounds in pool/tub until fully healed. 3.  Do not remove staples. 4.  May apply ice packs to the wounds for comfort. 5.  No heavy lifting or pushing  of more than 10-15 lbs for 4 weeks. 6.  Do not drive while taking narcotics for pain control.  Prior to driving, make sure you are able to rotate right and left to look at blindspots without significant pain or discomfort. 7.  Please continue taking the Vancomycin  prescription as instructed.  Do not skip doses.   Driving Restrictions   Complete by: As directed    Do not drive while taking narcotics for pain control.  Prior to driving, make sure you are able to rotate right and left to look at blindspots without significant pain or discomfort.   Increase activity slowly   Complete by: As directed    Lifting restrictions   Complete by: As directed    No heavy lifting or pushing of more than 10-15 lbs for 4 weeks.      Allergies as of 08/18/2024   No Known Allergies      Medication List     TAKE these medications    acetaminophen  500 MG tablet Commonly known as: TYLENOL  Take 1,000 mg by mouth every 6 (six) hours as needed (pain.).   albuterol  108 (90 Base) MCG/ACT inhaler Commonly known as: VENTOLIN  HFA Inhale 1 puff into the lungs every 6 (six) hours as needed for wheezing or shortness of breath.   allopurinol  300 MG tablet Commonly known as: ZYLOPRIM  Take 300 mg by mouth in the  morning.   apixaban  5 MG Tabs tablet Commonly known as: ELIQUIS  Take 5 mg by mouth 2 (two) times daily.   budesonide -formoterol  160-4.5 MCG/ACT inhaler Commonly known as: SYMBICORT  Inhale 2 puffs into the lungs 2 (two) times daily.   colchicine 0.6 MG tablet Take 0.6 mg by mouth daily as needed (gout flares.).   cyanocobalamin  1000 MCG tablet Commonly known as: VITAMIN B12 Take 1,000 mcg by mouth in the morning.   diclofenac  Sodium 1 % Gel Commonly known as: VOLTAREN  Apply 1 Application topically 4 (four) times daily as needed (pain.).   furosemide  20 MG tablet Commonly known as: LASIX  Take 20 mg by mouth daily as needed (fluid retention.).   nitroGLYCERIN  0.4 MG SL tablet Commonly  known as: NITROSTAT  Place 0.4 mg under the tongue every 5 (five) minutes x 3 doses as needed for chest pain.   ondansetron  4 MG disintegrating tablet Commonly known as: ZOFRAN -ODT Take 1 tablet (4 mg total) by mouth every 8 (eight) hours as needed for nausea or vomiting.   oxyCODONE  5 MG immediate release tablet Commonly known as: Oxy IR/ROXICODONE  Take 1-2 tablets (5-10 mg total) by mouth every 4 (four) hours as needed for moderate pain (pain score 4-6).   PROBIOTIC PO Take 1 capsule by mouth in the morning.   rosuvastatin  20 MG tablet Commonly known as: CRESTOR  Take 20 mg by mouth in the morning.   vancomycin  125 MG capsule Commonly known as: VANCOCIN  Take 1 capsule (125 mg total) by mouth 4 (four) times daily for 8 days.        Follow-up Information     Terryl Arthea SAUNDERS, PA-C Follow up on 08/21/2024.   Specialty: Physician Assistant Why: Appointment scheduled for 08/21/24 at 2 PM.  Please arrive 15 min early. Contact information: [address] [place] [address] [phone]                I spent 30 minutes dedicated to the care of this patient on the date of this encounter to include pre-visit review of records, face-to-face time with the patient discussing diagnosis and management, and any post-visit coordination of care.   Aloysius Plant, MD [facility]

## 2024-08-18 NOTE — Plan of Care (Signed)

## 2024-08-18 NOTE — Discharge Instructions (Signed)
 Discharge Instructions: 1.  Patient may shower, but do not scrub wounds heavily and dab dry only. 2.  Do not submerge wounds in pool/tub until fully healed. 3.  Do not remove staples. 4.  May apply ice packs to the wounds for comfort. 5.  No heavy lifting or pushing of more than 10-15 lbs for 4 weeks. 6.  Do not drive while taking narcotics for pain control.  Prior to driving, make sure you are able to rotate right and left to look at blindspots without significant pain or discomfort. 7.  Please continue taking the Vancomycin  prescription as instructed.  Do not skip doses.

## 2024-08-19 LAB — CULTURE, BLOOD (ROUTINE X 2)
Culture: NO GROWTH
Culture: NO GROWTH
Special Requests: ADEQUATE

## 2024-08-21 ENCOUNTER — Ambulatory Visit (INDEPENDENT_AMBULATORY_CARE_PROVIDER_SITE_OTHER): Admitting: Physician Assistant

## 2024-08-21 ENCOUNTER — Encounter: Payer: Self-pay | Admitting: Physician Assistant

## 2024-08-21 VITALS — BP 133/70 | HR 87 | Temp 98.6°F | Ht 69.0 in | Wt 188.8 lb

## 2024-08-21 DIAGNOSIS — Z932 Ileostomy status: Secondary | ICD-10-CM

## 2024-08-21 DIAGNOSIS — Z09 Encounter for follow-up examination after completed treatment for conditions other than malignant neoplasm: Secondary | ICD-10-CM

## 2024-08-21 DIAGNOSIS — A0472 Enterocolitis due to Clostridium difficile, not specified as recurrent: Secondary | ICD-10-CM

## 2024-08-21 NOTE — Progress Notes (Signed)
 [facility] POST-OP OFFICE VISIT  08/21/2024  HPI: Todd Rojas is a 83 y.o. male 12 days s/p ileostomy takedown complicated by readmission for C diff infection, discharged home on 08/31 with PO Vancomycin   He reports doing much better No abdominal pain Appetite is coming back; sporadic nausea No fever, chills, emesis Stools are becoming formed; he is taking his Abx as prescribed  Incision is well healed He is ambulatory; anxious to get back to his routine  No new complaints   Vital signs: BP 133/70   Pulse 87   Temp 98.6 F (37 C) (Oral)   Ht 5' 9 (1.753 m)   Wt 188 lb 12.8 oz (85.6 kg)   SpO2 94%   BMI 27.88 kg/m    Physical Exam: Constitutional: Well appearing male, NAD Abdomen: Soft, non-tender, non-distended, no rebound/guarding Skin: Ileostomy incisions is healing well, staples removed, no erythema nor drainage   Assessment/Plan: [name] 12 days s/p ileostomy takedown complicated by readmission for C diff infection, discharged home on 08/31 with PO Vancomycin    - Staples removed  - Complete Abx as prescribed  - Pain control prn  - Reviewed wound care recommendation  - Reviewed lifting restrictions; 6 weeks total  - I will see him in about 1 month for re-check; He, and his wife, understand to call with questions/concerns  -- Arthea Platt, PA-C [facility] 08/21/2024, 2:13 PM M-F: 7am - 4pm

## 2024-08-21 NOTE — Patient Instructions (Signed)

## 2024-09-18 ENCOUNTER — Ambulatory Visit: Admitting: Physician Assistant

## 2024-09-18 ENCOUNTER — Encounter: Payer: Self-pay | Admitting: Physician Assistant

## 2024-09-18 VITALS — BP 145/61 | HR 88 | Temp 98.4°F | Ht 69.0 in | Wt 187.4 lb

## 2024-09-18 DIAGNOSIS — Z932 Ileostomy status: Secondary | ICD-10-CM

## 2024-09-18 DIAGNOSIS — A0472 Enterocolitis due to Clostridium difficile, not specified as recurrent: Secondary | ICD-10-CM

## 2024-09-18 DIAGNOSIS — Z09 Encounter for follow-up examination after completed treatment for conditions other than malignant neoplasm: Secondary | ICD-10-CM

## 2024-09-18 NOTE — Progress Notes (Signed)
 [facility] POST-OP OFFICE VISIT  09/18/2024  HPI: Todd Rojas is a 83 y.o. male ~6 weeks s/p ileostomy takedown complicated by readmission for C diff infection, discharged home on 08/31 with PO Vancomycin    He is primarily concerned about some drainage from his ileostomy incision. He noticed this about 2 days prior to today. He described this as green in color. No erythema. No significant abdominal pain. He does endorse a subjective fever but did not measure a temperature. Otherwise reports he has minimal to no abdominal pain. No emesis. Bowel movements are normal. He is tolerating PO.   Vital signs: BP (!) 145/61   Pulse 88   Temp 98.4 F (36.9 C) (Oral)   Ht 5' 9 (1.753 m)   Wt 187 lb 6.4 oz (85 kg)   SpO2 96%   BMI 27.67 kg/m    Physical Exam: Constitutional: Well appearing male, NAD Abdomen: Soft, non-tender, non-distended, no rebound/guarding Skin: The central 1-2 cm of his ileostomy closure has dehisced on the skin level only, there is no depth. Visible tissue appears fibrinous in nature. I do not appreciate any fluid collections and there is no expressible drainage. The surrounding skin is not erythematous.   Assessment/Plan: [name] ~6 weeks s/p ileostomy takedown complicated by readmission for C diff infection, discharged home on 08/31 with PO Vancomycin     - He has very superficial skin dehiscence of his ileosotomy closure without any evidence of infection or additional complication. I did apply silver nitrate to this to facilitate scarring and limit drainage. Applied gauze and Tegaderm. He can continue superficial dressings until closure of this. Remains okay to shower  - He is otherwise recovering well from ileostomy takedown  - Encouraged him to measure and monitor his temperature. He has had a history in the last year of fever of unknown origin  - Pain control prn  - I will see him in ~2 weeks for wound check. After this time  hopefully we can move to 3 month follow up. He, and his wife, understand to call with questions/concerns  -- Arthea Platt, PA-C [facility] 09/18/2024, 3:09 PM M-F: 7am - 4pm

## 2024-09-18 NOTE — Progress Notes (Signed)
 Chief Complaint  Patient presents with  . Follow-up    HPI  Todd Rojas is a 83 y.o. 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 Parris  Denies chest pains or ankle swelling. Continues to have issues with hemorrhoids- Declines surgery  Ex Smoker- Quit more than 25 yrs ( 1/2 ppd x 10 yrs). Recent labs showed Hgb ; 10.6 Sugar: 101 Se Creat : 1.6,(EGFR: 42), A1c; 5.9 TSH: 0.362 ,Total Cholesterol: 92, Triglycerides; 101, TSH; 1.419 ,and PSA: 3.08     ROS Rest of 10 point review of systems is normal.  Outpatient Encounter Medications as of 09/18/2024  Medication Sig Dispense Refill  . acetaminophen  (TYLENOL ) 500 mg capsule Take 500 mg by mouth every 4 (four) hours as needed for Fever    . allopurinoL  (ZYLOPRIM ) 300 MG tablet Take 1 tablet (300 mg total) by mouth once daily 100 tablet 3  . colchicine (COLCRYS) 0.6 mg tablet Take 1 tablet (0.6 mg total) by mouth 2 (two) times daily Take at first sign of gout 60 tablet 1  . cyanocobalamin  (VITAMIN B12) 1000 MCG tablet Take 1,000 mcg by mouth once daily.    . diclofenac  (VOLTAREN ) 1 % topical gel Apply 2 g topically 3 (three) times daily    . FUROsemide  (LASIX ) 40 MG tablet TAKE 1 TABLET BY MOUTH EVERY DAY (Patient taking differently: Take 40 mg by mouth once daily as needed for Edema Takes one time a week) 90 tablet 1  . Lactobacillus acidophilus (PROBIOTIC ORAL) Take by mouth    . lisinopriL  (ZESTRIL ) 10 MG tablet Take 1 tablet (10 mg total) by mouth once daily 30 tablet 11  . meclizine (ANTIVERT) 25 mg tablet     . nitroGLYcerin  (NITROSTAT ) 0.4 MG SL tablet Place  1 tablet (0.4 mg total) under the tongue every 5 (five) minutes as needed for Chest pain May take up to 3 doses. 25 tablet 5  . ondansetron  (ZOFRAN -ODT) 4 MG disintegrating tablet Take 4 mg by mouth every 8 (eight) hours as needed    . potassium chloride (MICRO-K) 10 MEQ ER capsule Take 2 capsules (20 mEq total) by mouth as directed (take 2 capsules on days you take Lasix ) 60 capsule 11  . rosuvastatin  (CRESTOR ) 20 MG tablet Take 1 tablet (20 mg total) by mouth once daily 90 tablet 3  . albuterol  MDI, PROVENTIL , VENTOLIN , PROAIR , HFA 90 mcg/actuation inhaler Inhale 2 inhalations into the lungs every 6 (six) hours as needed for Wheezing 1 each 5  . apixaban  (ELIQUIS ) 5 mg tablet Take 1 tablet (5 mg total) by mouth every 12 (twelve) hours for 30 days 60 tablet 5  . aspirin  81 MG EC tablet Take 81 mg by mouth once daily Part of study with Rivarbaxan (Patient not taking: Reported on 06/26/2024)    . budesonide -formoteroL  (SYMBICORT ) 160-4.5 mcg/actuation inhaler Inhale 2 inhalations into the lungs 2 (two) times daily 10.2 g 5   No facility-administered encounter medications on file as of 09/18/2024.    Allergies as of 09/18/2024  . (No Known Allergies)    Past Medical History:  Diagnosis Date  . CAD (coronary artery disease) 02/13/2012   12/2004 - Anginal equivalent symptoms:  Left arm pain, weakness, and shortness of breath.  12/2004:  Anterior STEMI.  Transferred to [facility].  01/06/2005 - Cardiac catheterization:  EF 51%, an occluded mid LAD and a large OM2 with 75% ostial stenosis.  PCI of the mid occluded LAD using a Cypher stent.  03/2005 - 2D echocardiogram:  EF >55%, LAE 4.7 cm, no significant valvular disease.  02/2008:  Plavix stopped.  06/2008:  Recurrent angina - symptoms of chest tightness.  Presented to [facility].  Underwent cardiac catheterization.  The patient stated nonobstructive CAD seen.  Plavix restarted at that time.  03/2009 - 2D echo:  EF 35%, akinetic apex and  septum.  Stress echocardiogram deferred for cardiac catheterization.  03/30/2009:  Status post angioplasty to proximal LAD with a 3.0 x 18 mm Xience DES.  02/2009: Slow and gradual return of chest tightness, fatigue and generalized weakness concerning for return of anginal equivalent.  Stress test did not show ischemia to the heart mus  . CAD (coronary artery disease)   . Childhood asthma (HHS-HCC) 03/09/2013  . Deafness in left ear    Sudden deafness left ear, diagnosed in 2006.  SABRA Dyslipidemia 02/13/2012   03/2009  TC 158, TG 88, HDL 39, LDL 102.    SABRA GI disease 03/09/2013   Dysphagia - EGD demonstrated esophageal stricture which was dilated  GERD  Hemorrhoids     . Hearing loss 03/09/2013  . History of colon polyps 1999   Tubular adenoma.  . History of esophageal stricture    Status post dilatation.  SABRA History of tobacco abuse 02/13/2012    Smoked one pack of cigarettes per week x15 years, quit smoking 25 years ago.    SABRA History of vertigo   . Hx of adenomatous colonic polyps 04/23/2018  . Hyperlipidemia   . Hypertension 02/13/2012  . Hypertension   . Myocardial infarction (CMS/HHS-HCC) 12/2004  . Osteoarthritis   . Plantar fasciitis    Left greater than right.  . Schatzki's ring 1999   Found on endoscopy 1999.    Past Surgical History:  Procedure Laterality Date  . Hemorrhoidectomy surgery  1978  . COLONOSCOPY  11/04/1998   Adenomatous Polyp  . COLONOSCOPY  11/30/2001   PH Adenomatous Polyp  . Cardiac stents  2006  . COLONOSCOPY  10/20/2005   Adenomatous Polyps  . Right shoulder arthroscopy  2007  . JOINT REPLACEMENT Right 07/13/2015   knee  . REPLACEMENT UNICONDYLAR JOINT KNEE Left 12/07/2015  . kidney stone surgery  08/09/2017  . COLONOSCOPY  10/08/2018   Adenomatous Polyps: CBF 09/2021 (If in good health)   . Colon @ [facility]  01/06/2023   Colonoscopy unremarkable/PHx CP/no repeat needed/CTL  . FLEXIBLE SIGMOIDOSCOPY  03/17/2024   normal examined colon/ proper diagnostic was  perfromed due to unprpped condition of colon  . COLONOSCOPY  04/29/2013, 03/31/2008   PH Adenomatous Polyps: CBF 04/2018; Recall Ltr mailed 03/14/2018 (dh)  . EGD  03/31/2008, 11/04/1998   No repeat per RTE  . left knee surgery    . partial knee replacement Right   . partial knee replacement Left   . Right knee surgery      Blood pressure 136/76, pulse 69, height 175.3 cm (5' 9), weight 84.9 kg (187 lb 3.2 oz), SpO2 97%.     Exam     Blood pressure 136/76, pulse 69, height 175.3 cm (5' 9),  weight 84.9 kg (187 lb 3.2 oz), SpO2 97%.   Body mass index is 27.64 kg/m.   Wt Readings from Last 3 Encounters:  09/18/24 84.9 kg (187 lb 3.2 oz)  09/06/24 83.8 kg (184 lb 12.8 oz)  06/26/24 79.8 kg (176 lb)  Hard of hearing  General. Alert oriented x3  Skin. No suspicious lesions or moles.   Eyes. Sclera and conjunctiva clear; pupils equal round and reactive to light ; extraocular movements intact Ears. External normal; canals clear; tympanic membranes normal Neck: No JVD Nose. Mucosa healthy without drainage or ulceration Oropharynx. No suspicious lesions Neck. No swelling, masses, stiffness, pain, limited movement, carotid pulses normal bilaterally, thyroid  normal size, no masses palpated.  No bruits Lungs. Respirations unlabored; Bibasilar crackles noted  Back. No spinal deformity Cardiovascular. Heart regular rate and rhythm without murmurs, gallops, or rubs Abdomen. Soft; non tender;Ileostomy noted  non distended; normoactive bowel sounds; no masses or organomegaly Abdominal incision healing but has a small opening Lymph Nodes. No significant cervical, supraclavicular, axillary or inguinal lymphadenopathy noted Musculoskeletal:Normal Extremities. Normal, Neurologic. 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 Aleskerov On Albuterol   and  Symbicort  inhalers for COPD  2 S/p Ileoistomy;reversal complicated by c diff colitis  Follow up with Dr. Jordis  3 CAD- s/p stents to LAD, 2 nd marginal and proximal Circumflex; Doing well On Plavix and Pravachol   Sees Dr. [facility] - [facility]  4 Home Sleep test was positive- ; Declines CPAP  Discussed need for Weight reduction  5 HTN: Stable- No longer on Lisinopril    6 Elevates sugars; (A1c; 5.9 )  Discussed low carb diet  7 Hyperlipidemia: On Crestor   8 Personal history of gout: On Allopurinol  9 Thyroid  nodule; U/s 03/10/24; 1. Solitary 2.2 cm TI-RADS category 4 nodule in the left lower gland  FNA Biopsy done on 9.16.25  10 Health maintenance Up to date with Flu shot  and Pneumovax-2011, Inj Shingrix   PSA- Normal (3.08). Colonoscopy-2006-polyps. Repeated in 2014- 2 polyps resected in rectosigmoid area, Internal hemorrhoids Repeated in Oct 2019- 3 diminutive polyps Repeated in Jan 2024; Diverticulosis, Internal hemorrhoids  Discussed results of labs Continue efforts att weight reduction  Labs 1 week prior to next visit  Follow up in 4 months    Tamra Leventhal  MD

## 2024-09-21 ENCOUNTER — Emergency Department

## 2024-09-21 ENCOUNTER — Other Ambulatory Visit: Payer: Self-pay

## 2024-09-21 ENCOUNTER — Emergency Department
Admission: EM | Admit: 2024-09-21 | Discharge: 2024-09-21 | Disposition: A | Discharge status: Home / Self Care | Source: Ambulatory Visit | Attending: Emergency Medicine | Admitting: Emergency Medicine

## 2024-09-21 DIAGNOSIS — A0471 Enterocolitis due to Clostridium difficile, recurrent: Secondary | ICD-10-CM | POA: Insufficient documentation

## 2024-09-21 DIAGNOSIS — R509 Fever, unspecified: Secondary | ICD-10-CM | POA: Diagnosis present

## 2024-09-21 DIAGNOSIS — A0472 Enterocolitis due to Clostridium difficile, not specified as recurrent: Secondary | ICD-10-CM

## 2024-09-21 LAB — C DIFFICILE QUICK SCREEN W PCR REFLEX
C Diff antigen: POSITIVE — AB
C Diff interpretation: DETECTED
C Diff toxin: POSITIVE — AB

## 2024-09-21 LAB — LACTIC ACID, PLASMA
Lactic Acid, Venous: 2.3 mmol/L (ref 0.5–1.9)
Lactic Acid, Venous: 1.1 mmol/L (ref 0.5–1.9)

## 2024-09-21 LAB — URINALYSIS, W/ REFLEX TO CULTURE (INFECTION SUSPECTED)
Bilirubin Urine: NEGATIVE
Glucose, UA: NEGATIVE mg/dL
Ketones, ur: NEGATIVE mg/dL
Leukocytes,Ua: NEGATIVE
Nitrite: NEGATIVE
Protein, ur: NEGATIVE mg/dL
Specific Gravity, Urine: 1.011 (ref 1.005–1.030)
pH: 5 (ref 5.0–8.0)

## 2024-09-21 LAB — CBC WITH DIFFERENTIAL/PLATELET
Abs Immature Granulocytes: 0.03 K/uL (ref 0.00–0.07)
Basophils Absolute: 0 K/uL (ref 0.0–0.1)
Basophils Relative: 0 %
Eosinophils Absolute: 0.1 K/uL (ref 0.0–0.5)
Eosinophils Relative: 2 %
HCT: 33.9 % — ABNORMAL LOW (ref 39.0–52.0)
Hemoglobin: 10.9 g/dL — ABNORMAL LOW (ref 13.0–17.0)
Immature Granulocytes: 0 %
Lymphocytes Relative: 14 %
Lymphs Abs: 1.2 K/uL (ref 0.7–4.0)
MCH: 32.5 pg (ref 26.0–34.0)
MCHC: 32.2 g/dL (ref 30.0–36.0)
MCV: 101.2 fL — ABNORMAL HIGH (ref 80.0–100.0)
Monocytes Absolute: 1.1 K/uL — ABNORMAL HIGH (ref 0.1–1.0)
Monocytes Relative: 13 %
Neutro Abs: 5.7 K/uL (ref 1.7–7.7)
Neutrophils Relative %: 71 %
Platelets: 189 K/uL (ref 150–400)
RBC: 3.35 MIL/uL — ABNORMAL LOW (ref 4.22–5.81)
RDW: 13.6 % (ref 11.5–15.5)
WBC: 8.1 K/uL (ref 4.0–10.5)
nRBC: 0 % (ref 0.0–0.2)

## 2024-09-21 LAB — COMPREHENSIVE METABOLIC PANEL WITH GFR
ALT: 9 U/L (ref 0–44)
AST: 12 U/L — ABNORMAL LOW (ref 15–41)
Albumin: 3.5 g/dL (ref 3.5–5.0)
Alkaline Phosphatase: 42 U/L (ref 38–126)
Anion gap: 15 (ref 5–15)
BUN: 16 mg/dL (ref 8–23)
CO2: 23 mmol/L (ref 22–32)
Calcium: 8.8 mg/dL — ABNORMAL LOW (ref 8.9–10.3)
Chloride: 104 mmol/L (ref 98–111)
Creatinine, Ser: 1.38 mg/dL — ABNORMAL HIGH (ref 0.61–1.24)
GFR, Estimated: 51 mL/min — ABNORMAL LOW (ref >60)
Glucose, Bld: 96 mg/dL (ref 70–99)
Potassium: 3.7 mmol/L (ref 3.5–5.1)
Sodium: 142 mmol/L (ref 135–145)
Total Bilirubin: 0.9 mg/dL (ref 0.0–1.2)
Total Protein: 6.7 g/dL (ref 6.5–8.1)

## 2024-09-21 LAB — LIPASE, BLOOD: Lipase: 20 U/L (ref 11–51)

## 2024-09-21 MED ORDER — VANCOMYCIN HCL 125 MG PO CAPS: 125.0000 mg | ORAL_CAPSULE | Freq: Four times a day (QID) | ORAL | 0 refills | Status: AC

## 2024-09-21 MED ORDER — VANCOMYCIN HCL 125 MG PO CAPS
125.0000 mg | ORAL_CAPSULE | Freq: Once | ORAL | Status: AC
  Administered 2024-09-21: 125 mg via ORAL
  Filled 2024-09-21: qty 1

## 2024-09-21 MED ORDER — IOHEXOL 300 MG/ML  SOLN
100.0000 mL | Freq: Once | INTRAMUSCULAR | Status: AC | PRN
  Administered 2024-09-21: 100 mL via INTRAVENOUS

## 2024-09-21 MED ORDER — SODIUM CHLORIDE 0.9 % IV BOLUS
1000.0000 mL | Freq: Once | INTRAVENOUS | Status: AC
  Administered 2024-09-21: 1000 mL via INTRAVENOUS

## 2024-09-21 NOTE — ED Notes (Signed)
 Fall bundle is in place. Applied hospital socks on pt at this time.

## 2024-09-21 NOTE — Discharge Instructions (Addendum)
 As we discussed, longer course of vancomycin  antibiotics for recurrence of C. difficile.  14 days of this medication, 4 times per day every day

## 2024-09-21 NOTE — ED Triage Notes (Signed)
 Pt to ED from [facility] for fever. Pt states hx of colostomy that has been reversed and is concerned about infection at incision site. Pt states stools have been loose, endorses nausea, denies vomiting. Pt A&O.

## 2024-09-21 NOTE — ED Provider Notes (Signed)
 Patient received in signout from Dr. Arlander pending CT abdomen/pelvis.  Recent ostomy reversal.  CT without acute features.  We send a C. difficile test which returned positive for both toxin and antigen suggestive of first recurrence of C. difficile.  He had a 10-day course of oral vancomycin  that finished nearly 1 month ago.  I consult with infectious disease who recommends extending to a 14-day course and they can follow-up as an outpatient.  Fidaxomicin  was unavailable to the patient due to insurance issues.  I update the patient of this and his wife and they are agreeable.  Patient is suitable for outpatient management he has no signs of sepsis, megacolon or indications for admissions at this point.  We discussed ED return precautions and care at home.  Clinical Course as of 09/21/24 1707  Sat Sep 21, 2024  1653 14 day Vanco 125mg  QID, send him a secure chat message and he can follow up [DS]    Clinical Course User Index [DS] Claudene Rover, MD      Claudene Rover, MD 09/21/24 [phone]

## 2024-09-21 NOTE — ED Notes (Signed)
 First nurse note: From [facility] for fever, diarrhea X2 days. Pt is 6 months post ileostomy reversal and had c diff and PE 6 months ago, finished abx 4 weeks ago. Has incision from ileostomy reversal that is draining. PCP checked it this week and said to recheck in 2 weeks. Currently covered.  100.4, no tylenol  or motrin  today, 136/69, 94% SPO2.

## 2024-09-21 NOTE — ED Notes (Signed)
 This RN offered to take pt in wheelchair to his car but pt refused. Wife is at bedside and will walk with pt out to car

## 2024-09-21 NOTE — ED Provider Notes (Signed)
 [facility] Provider Note    Event Date/Time   First MD Initiated Contact with Patient 09/21/24 1237     (approximate)   History   Fever   HPI  Todd Rojas is a 83 y.o. male with a history of recent ileostomy closure on August 22.  Course complicated by C. difficile colitis, feels that he is mostly recovered from that.  Review of records demonstrates the patient saw PA at surgery clinic 2 days ago for evaluation of drainage from the wound, not thought to be infected at that time.  However last night and today patient has had a fever, he denies increased pain.  No congestion cough or other complaints.      Physical Exam   Triage Vital Signs: ED Triage Vitals  Encounter Vitals Group     BP 09/21/24 1215 (!) 145/100     Girls Systolic BP Percentile --      Girls Diastolic BP Percentile --      Boys Systolic BP Percentile --      Boys Diastolic BP Percentile --      Pulse Rate 09/21/24 1215 84     Resp 09/21/24 1215 16     Temp 09/21/24 1215 100.3 F (37.9 C)     Temp Source 09/21/24 1215 Oral     SpO2 09/21/24 1215 95 %     Weight 09/21/24 1218 80.7 kg (178 lb)     Height 09/21/24 1218 1.753 m (5' 9)     Head Circumference --      Peak Flow --      Pain Score 09/21/24 1216 0     Pain Loc --      Pain Education --      Exclude from Growth Chart --     Most recent vital signs: Vitals:   09/21/24 1700 09/21/24 1706  BP: (!) 151/64   Pulse:  78  Resp:  (!) 21  Temp:    SpO2:  97%     General: Awake, no distress.  CV:  Good peripheral perfusion.  Resp:  Normal effort.  Abd:  No distention.  Other:  Small amount of drainage from small area of dehiscence, no significant surrounding cellulitis noted   ED Results / Procedures / Treatments   Labs (all labs ordered are listed, but only abnormal results are displayed) Labs Reviewed  C DIFFICILE QUICK SCREEN W PCR REFLEX   - Abnormal; Notable for the following components:       Result Value   C Diff antigen POSITIVE (*)    C Diff toxin POSITIVE (*)    All other components within normal limits  LACTIC ACID, PLASMA - Abnormal; Notable for the following components:   Lactic Acid, Venous 2.3 (*)    All other components within normal limits  COMPREHENSIVE METABOLIC PANEL WITH GFR - Abnormal; Notable for the following components:   Creatinine, Ser 1.38 (*)    Calcium  8.8 (*)    AST 12 (*)    GFR, Estimated 51 (*)    All other components within normal limits  CBC WITH DIFFERENTIAL/PLATELET - Abnormal; Notable for the following components:   RBC 3.35 (*)    Hemoglobin 10.9 (*)    HCT 33.9 (*)    MCV 101.2 (*)    Monocytes Absolute 1.1 (*)    All other components within normal limits  URINALYSIS, W/ REFLEX TO CULTURE (INFECTION SUSPECTED) - Abnormal; Notable for the following components:  Color, Urine YELLOW (*)    APPearance CLEAR (*)    Hgb urine dipstick SMALL (*)    Bacteria, UA RARE (*)    All other components within normal limits  LACTIC ACID, PLASMA  LIPASE, BLOOD     EKG     RADIOLOGY CT pending    PROCEDURES:  Critical Care performed:   Procedures   MEDICATIONS ORDERED IN ED: Medications  sodium chloride  0.9 % bolus 1,000 mL (0 mLs Intravenous Stopped 09/21/24 1453)  iohexol  (OMNIPAQUE ) 300 MG/ML solution 100 mL (100 mLs Intravenous Contrast Given 09/21/24 1416)  vancomycin  (VANCOCIN ) capsule 125 mg (125 mg Oral Given 09/21/24 1711)     IMPRESSION / MDM / ASSESSMENT AND PLAN / ED COURSE  I reviewed the triage vital signs and the nursing notes. Patient's presentation is most consistent with acute presentation with potential threat to life or bodily function.  Patient presents with reports of fever, temperature of 100.3 with drainage from incision site, differential includes superficial cellulitis, deeper abscess/infection, nonspecific cause of fever.  Overall well-appearing in no acute distress, lab work reviewed and is reassuring,  lactic acid is mildly elevated, discussed with Dr. Jordis who recommends CT abdomen pelvis, I have asked my colleague to follow-up on CT results    Clinical Course as of 09/22/24 1424  Sat Sep 21, 2024  1653 14 day Vanco 125mg  QID, send him a secure chat message and he can follow up [DS]    Clinical Course User Index [DS] Claudene Rover, MD     FINAL CLINICAL IMPRESSION(S) / ED DIAGNOSES   Final diagnoses:  C. difficile colitis  Recurrent Clostridium difficile diarrhea     Rx / DC Orders   ED Discharge Orders          Ordered    vancomycin  (VANCOCIN ) 125 MG capsule  4 times daily        09/21/24 1656             Note:  This document was prepared using Dragon voice recognition software and may include unintentional dictation errors.   Arlander Charleston, MD 09/22/24 [id number]

## 2024-09-21 NOTE — ED Notes (Addendum)
 Bed alarm is on, grip socks are on, fall risk bracelet is on, and pt has call bell near him and on the bed rail

## 2024-10-02 ENCOUNTER — Ambulatory Visit (INDEPENDENT_AMBULATORY_CARE_PROVIDER_SITE_OTHER): Admitting: Physician Assistant

## 2024-10-02 VITALS — BP 129/68 | HR 98 | Temp 98.8°F | Ht 69.0 in | Wt 187.8 lb

## 2024-10-02 DIAGNOSIS — Z09 Encounter for follow-up examination after completed treatment for conditions other than malignant neoplasm: Secondary | ICD-10-CM

## 2024-10-02 DIAGNOSIS — A0472 Enterocolitis due to Clostridium difficile, not specified as recurrent: Secondary | ICD-10-CM

## 2024-10-02 DIAGNOSIS — Z932 Ileostomy status: Secondary | ICD-10-CM

## 2024-10-02 NOTE — Patient Instructions (Signed)
 Ileostomy Reversal, Care After The following information offers guidance on how to care for yourself after your procedure. Your health care provider may also give you more specific instructions. If you have problems or questions, contact your health care provider. What can I expect after the procedure? After the procedure, it is common to have: A small amount of blood or clear fluid coming from your incision. Pain and discomfort in your abdomen, especially near your incision. Irregular bowel movements for several days. Follow these instructions at home: Medicines Take over-the-counter and prescription medicines only as told by your health care provider. Ask your health care provider if the medicine prescribed to you requires you to avoid driving or using machinery. Incision care  Keep your incision area clean and dry. Follow instructions from your health care provider about how to take care of your incision. Make sure you: Wash your hands with soap and water for at least 20 seconds before and after you change your bandage (dressing). If soap and water are not available, use hand sanitizer. Change your dressing as told by your health care provider. Leave stitches (sutures), skin glue, or adhesive strips in place. These skin closures may need to stay in place for 2 weeks or longer. If adhesive strip edges start to loosen and curl up, you may trim the loose edges. Do not remove adhesive strips completely unless your health care provider tells you to do that. Check your incision area every day for signs of infection. Check for: More redness, swelling, or pain. More fluid or blood. Warmth. Pus or a bad smell. Eating and drinking  Follow instructions from your health care provider about what you may eat and drink. Advance your diet as instructed. If you have pain or discomfort after you eat, try eating soft, bland foods until you do not experience these symptoms anymore. Eat meals and snacks at  regular intervals. Drink enough fluid to keep your urine pale yellow. Activity  Rest as much as possible while you are healing. Return to your normal activities as told by your health care provider. Ask your health care provider what activities are safe for you. Avoid intense physical activity for as long as you are told by your health care provider. You may have to avoid lifting. Ask your health care provider how much you can safely lift. General instructions Wear compression stockings as told by your health care provider. These stockings help to prevent blood clots and reduce swelling in your legs. Do not take baths, swim, or use a hot tub until your health care provider approves. Ask your health care provider if you may take showers. You may only be allowed to take sponge baths. Do not use any products that contain nicotine or tobacco. These products include cigarettes, chewing tobacco, and vaping devices, such as e-cigarettes. These can delay healing after surgery. If you need help quitting, ask your health care provider. Keep track of your bowel movements. Keep all follow-up visits. Contact a health care provider if: You have any of these signs of infection: More redness, swelling, or pain around your incision. More fluid or blood coming from your incision. Warmth coming from your incision. Pus or a bad smell coming from your incision. A fever. You are having difficulty with bowel movements, such as: Diarrhea or constipation. Pain or bleeding during bowel movements. Inability to control when you have bowel movements (incontinence). You have abdominal pain, bloating, pressure, or cramping. You feel nauseous or you vomit. You have signs of  dehydration, such as being unusually thirsty, always having a dry mouth, having dark colored urine, or mental confusion. You feel dizzy or light-headed. You have an unusual lack of energy (fatigue). Get help right away if: You have abdominal pain  or cramps that do not go away with medicine or get worse. You vomit more than once. You have an irregular heartbeat. You have chest pain. You have shortness of breath. You have bleeding from your rectum. These symptoms may be an emergency. Get help right away. Call 911. Do not wait to see if the symptoms will go away. Do not drive yourself to the hospital. Summary Take over-the-counter and prescription medicines only as told by your health care provider. Keep track of your bowel movements. Contact a health care provider if you are having difficulty with bowel movements, such as diarrhea, constipation, or pain or bleeding during bowel movements. Contact a health care provider if you have redness, swelling, or pain around your incision. Keep all follow-up visits. This information is not intended to replace advice given to you by your health care provider. Make sure you discuss any questions you have with your health care provider. Document Revised: 07/11/2023 Document Reviewed: 02/15/2022 Elsevier Patient Education  2024 [employer].

## 2024-10-04 NOTE — Progress Notes (Signed)
 [facility] POST-OP OFFICE VISIT  10/04/2024  HPI: Todd Rojas is a 83 y.o. male s/p ileostomy takedown on 08/22 complicated by readmission for C diff infection, now with recurrence   Patient unfortunately had another ED visit on 10/04 secondary to persistent fevers and night sweats. At that time, his WBC remained normal at 8.1K, Hgb to 10 (improved), sCr baseline at 1.38, mild venous lactate to 2.3 (normalized). He did have CT Abdomen/pelvis which was reassuring. He was again tested for C diff and found to be positive. He was discharged with 14 days of PO vancomycin   He presents today for follow up. He reports feeling well. No significant abdominal pain, nausea, emesis. He states his bowel movements are pretty good. He has not had any drainage from his incision in the last 24 hours. No fevers but with continued night sweats. No issues with the vancomycin .   Vital signs: BP 129/68   Pulse 98   Temp 98.8 F (37.1 C) (Oral)   Ht 5' 9 (1.753 m)   Wt 187 lb 12.8 oz (85.2 kg)   SpO2 96%   BMI 27.73 kg/m    Physical Exam: Constitutional: Well appearing male, NAD Abdomen: Soft, non-tender, non-distended, no rebound/guarding Skin: There is a small punctate area of hypergranulation tissue to the center of his previous ileostomy site, improved from last visit. I do not appreciate any drainage at this time. I made extensive effort to find any expressible drainage and there was none. The surrounding skin was without erythema as well.   Assessment/Plan: [name] s/p ileostomy takedown on 08/22 complicated by readmission for C diff infection, now with recurrence    - Uncertain as to why he has failed to clear initial C diff infection or why this has recurred. He has not been on Abx in the interim. Appreciate ID consultation in the EDP and recommend completion of current course. May benefit from ID follow up, or at a minimum with PCP for this. At this point,  I am not certain it is related to his ileostomy take down  - In regards to his ileostomy incision, this looks better from 2 weeks ago. There is visible granulation tissue centrally and I am unable to appreciate any drainage on examination. Reviewing his CT from 10/04 does not show any undrained collection or necrosis. There is no clinical or radiographic evidence of fistula either. For now, would continue superficial dressing as needed and monitor drainage   - No need for surgical intervention  - I will have him follow up with us  again in 1-2 weeks to monitor progression; He, and his wife, understand to call with questions/concerns in the inteirm  -- Arthea Platt, PA-C [facility] 10/04/2024, 10:19 AM M-F: 7am - 4pm

## 2024-10-11 ENCOUNTER — Other Ambulatory Visit: Payer: Self-pay | Admitting: Pulmonary Disease

## 2024-10-11 DIAGNOSIS — I2699 Other pulmonary embolism without acute cor pulmonale: Secondary | ICD-10-CM

## 2024-10-11 DIAGNOSIS — R918 Other nonspecific abnormal finding of lung field: Secondary | ICD-10-CM

## 2024-10-14 ENCOUNTER — Ambulatory Visit: Admitting: Surgery

## 2024-10-14 ENCOUNTER — Encounter: Payer: Self-pay | Admitting: Surgery

## 2024-10-14 VITALS — BP 114/58 | HR 59 | Ht 69.0 in | Wt 185.0 lb

## 2024-10-14 DIAGNOSIS — Z09 Encounter for follow-up examination after completed treatment for conditions other than malignant neoplasm: Secondary | ICD-10-CM

## 2024-10-14 NOTE — Patient Instructions (Addendum)
 Continue to keep a clean dry gauzed over the area and change daily until it stops draining. You may wash the area with soap and water with your shower.   Follow-up with our office in December.  Please call and ask to speak with a nurse if you develop questions or concerns.   GENERAL POST-OPERATIVE PATIENT INSTRUCTIONS   WOUND CARE INSTRUCTIONS:  Keep a dry clean dressing on the wound if there is drainage. The initial bandage may be removed after 24 hours.  Once the wound has quit draining you may leave it open to air.  If clothing rubs against the wound or causes irritation and the wound is not draining you may cover it with a dry dressing during the daytime.  Try to keep the wound dry and avoid ointments on the wound unless directed to do so.  If the wound becomes bright red and painful or starts to drain infected material that is not clear, please contact your physician immediately.  If the wound is mildly pink and has a thick firm ridge underneath it, this is normal, and is referred to as a healing ridge.  This will resolve over the next 4-6 weeks.  BATHING: You may shower if you have been informed of this by your surgeon. However, Please do not submerge in a tub, hot tub, or pool until incisions are completely sealed or have been told by your surgeon that you may do so.  DIET:  You may eat any foods that you can tolerate.  It is a good idea to eat a high fiber diet and take in plenty of fluids to prevent constipation.  If you do become constipated you may want to take a mild laxative or take ducolax tablets on a daily basis until your bowel habits are regular.  Constipation can be very uncomfortable, along with straining, after recent surgery.  ACTIVITY:  You are encouraged to cough and deep breath or use your incentive spirometer if you were given one, every 15-30 minutes when awake.  This will help prevent respiratory complications and low grade fevers post-operatively if you had a general  anesthetic.  You may want to hug a pillow when coughing and sneezing to add additional support to the surgical area, if you had abdominal or chest surgery, which will decrease pain during these times.  You are encouraged to walk and engage in light activity for the next two weeks.  You should not lift more than 20 pounds for 6 weeks total after surgery as it could put you at increased risk for complications.  Twenty pounds is roughly equivalent to a plastic bag of groceries. At that time- Listen to your body when lifting, if you have pain when lifting, stop and then try again in a few days. Soreness after doing exercises or activities of daily living is normal as you get back in to your normal routine.  MEDICATIONS:  Try to take narcotic medications and anti-inflammatory medications, such as tylenol , ibuprofen , naprosyn, etc., with food.  This will minimize stomach upset from the medication.  Should you develop nausea and vomiting from the pain medication, or develop a rash, please discontinue the medication and contact your physician.  You should not drive, make important decisions, or operate machinery when taking narcotic pain medication.  SUNBLOCK Use sun block to incision area over the next year if this area will be exposed to sun. This helps decrease scarring and will allow you avoid a permanent darkened area over your incision.  QUESTIONS:  Please feel free to call our office if you have any questions, and we will be glad to assist you. [phone]

## 2024-10-17 NOTE — Progress Notes (Signed)
 Outpatient Surgical Follow Up    Todd Rojas is an 83 y.o. male.   Chief Complaint  Patient presents with   Routine Post Op    HPI: s/p ileostomy reversal, recurrent c diff. Taking po, ambulating , no fevers, small wound from ostomy reversal  Past Medical History:  Diagnosis Date   Actinic keratosis    Acute ST elevation myocardial infarction (STEMI) of anterior wall (HCC) 01/06/2005   a.) PCI 04/06/2005:100% mLAD (3.0 x 23 mm Cypher)   Adenomatous colon polyp    Anginal pain    Aortic atherosclerosis    Arthritis    Asthma    Basal cell carcinoma 03/17/2020   right ant lat deltoid   CAD (coronary artery disease)    a.) Anterolateral STEMI 01/06/2005: 100% mLAD (3.0 x 23 mm Cypher); b.) LHC/PCI  03/30/2009: 90% pLAD (3.0 x 18 mm Xience DES); c.) LHC/PCI 06/03/2021: 90% LAD (PTCA), 70% pLCx (3.5 x 12 mm DES - unk type), 70% OM (2.5 x 18 mm DES - unk type)   Cholelithiasis    COPD (chronic obstructive pulmonary disease) (HCC) 07/03/2017   DDD (degenerative disc disease), thoracolumbar    Dyspnea    Enlarged prostate    Esophageal stricture s/p dilation    GERD (gastroesophageal reflux disease)    Gout    Hearing loss 03/09/2013   Hemorrhoids    HFrEF (heart failure with reduced ejection fraction) (HCC)    Hiatal hernia    Hyperlipidemia, unspecified 07/03/2017   Hypertension 02/13/2012   Ileus due to infection [facility])    a.) s/p ileostomy creation 03/20/2024   Nephrolithiasis    NICM (nonischemic cardiomyopathy) (HCC)    On apixaban  therapy    Parastomal hernia    Pneumonia    Prostate mass 07/09/2024   a.) 2.7 cm PZ lesion in the LEFTposteromedial and posterolateral mid gland - highly suspicious for high-grade carcinoma   Pulmonary embolism (HCC) 03/2024   Right-sided low back pain without sciatica 04/14/2016   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   R ear - ED&C   Status post coronary artery stent placement    a.) TOTAL # STENTS: 4 as of 08/08/2024    Thyroid  nodule    Unintentional weight loss    a.) down 40+ lbs between 02/2024 - 05/2024 --> associated with sepsis, colonic ileus (odor associated with mantainence), and metallic dysgeusia.    Past Surgical History:  Procedure Laterality Date   COLONOSCOPY WITH PROPOFOL  N/A 10/08/2018   Procedure: COLONOSCOPY WITH PROPOFOL ;  Surgeon: Viktoria Lamar DASEN, MD;  Location: [facility] ENDOSCOPY;  Service: Endoscopy;  Laterality: N/A;   COLONOSCOPY WITH PROPOFOL  N/A 01/06/2023   Procedure: COLONOSCOPY WITH PROPOFOL ;  Surgeon: Maryruth Ole DASEN, MD;  Location: [facility] ENDOSCOPY;  Service: Endoscopy;  Laterality: N/A;   CORONARY ANGIOPLASTY WITH STENT PLACEMENT Left 01/06/2005   Procedure: CORONARY ANGIOPLASTY WITH STENT PLACEMENT; Location: [facility]; Surgeon: Marsa Dooms, MD   CORONARY ANGIOPLASTY WITH STENT PLACEMENT Left 06/03/2021   Procedure: CORONARY ANGIOPLASTY WITH STENT PLACEMENT; Location: [facility]; Surgeon: Manford Blanch, MD   CORONARY ANGIOPLASTY WITH STENT PLACEMENT Left 03/30/2009   Procedure: CORONARY ANGIOPLASTY WITH STENT PLACEMENT; Location: [facility]; Surgeon: Manford Blanch, MD   CREATION, ILEOSTOMY, DIVERTING, ROBOT-ASSISTED  03/2024   CYSTOSCOPY WITH STENT PLACEMENT Left 08/09/2017   Procedure: CYSTOSCOPY WITH STENT PLACEMENT;  Surgeon: Chauncey Redell Agent, MD;  Location: [facility] ORS;  Service: Urology;  Laterality: Left;   FLEXIBLE SIGMOIDOSCOPY N/A 03/17/2024   Procedure: SIGMOIDOSCOPY, FLEXIBLE;  Surgeon: Hendricks,  Teodoro K, MD;  Location: [facility] ENDOSCOPY;  Service: Gastroenterology;  Laterality: N/A;   ILEOSTOMY CLOSURE N/A 08/09/2024   Procedure: CLOSURE, ILEOSTOMY;  Surgeon: Jordis Laneta FALCON, MD;  Location: [facility] ORS;  Service: General;  Laterality: N/A;   LEFT HEART CATH AND CORONARY ANGIOGRAPHY Left 05/16/2008   Procedure: LEFT HEART CATH AND CORONARY ANGIOGRAPHY; Location: [facility]; Surgeon: Marsa Overall, MD   LEFT HEART CATH AND CORONARY ANGIOGRAPHY Left 04/12/2018   Procedure: LEFT  HEART CATH AND CORONARY ANGIOGRAPHY; Location: [facility]; Surgeon: Manford Blanch, MD   MEDIAL PARTIAL KNEE REPLACEMENT Bilateral 2016   PARASTOMAL HERNIA REPAIR N/A 08/09/2024   Procedure: REPAIR, HERNIA, PARASTOMAL;  Surgeon: Jordis Laneta FALCON, MD;  Location: [facility] ORS;  Service: General;  Laterality: N/A;   URETEROSCOPY WITH HOLMIUM LASER LITHOTRIPSY Left 08/09/2017   Procedure: URETEROSCOPY WITH HOLMIUM LASER LITHOTRIPSY/INCISION OF URETEROCELE;  Surgeon: Chauncey Redell Agent, MD;  Location: [facility] ORS;  Service: Urology;  Laterality: Left;   XI ROBOT ASSISTED DIAGNOSTIC LAPAROSCOPY N/A 03/20/2024   Procedure: LAPAROSCOPY, DIAGNOSTIC, ROBOT-ASSISTED;  Surgeon: Jordis Laneta FALCON, MD;  Location: [facility] ORS;  Service: General;  Laterality: N/A;    Family History  Problem Relation Age of Onset   Alzheimer's disease Mother    Leukemia Father    Prostate cancer Neg Hx    Kidney cancer Neg Hx    Bladder Cancer Neg Hx     Social History:  reports that he has quit smoking. He has been exposed to tobacco smoke. He quit smokeless tobacco use about 30 years ago.  His smokeless tobacco use included chew. He reports that he does not drink alcohol  and does not use drugs.  Allergies: No Known Allergies  Medications reviewed.    ROS Full ROS performed and is otherwise negative other than what is stated in HPI   BP (!) 114/58   Pulse (!) 59   Ht 5' 9 (1.753 m)   Wt 185 lb (83.9 kg)   SpO2 98%   BMI 27.32 kg/m   Physical Exam   NAD alert Abd: soft, nt, minuscule wound very superficial 3 mm replaced dressing, no infection  Assessment/Plan:Doing well w/o complications Main isssue is recurrent c diff infections No need for hospitalization or interventions Rtc 1-2 months   Laneta Jordis, MD [facility] General Surgeon

## 2024-10-23 ENCOUNTER — Ambulatory Visit
Admission: RE | Admit: 2024-10-23 | Discharge: 2024-10-23 | Disposition: A | Discharge status: Home / Self Care | Source: Ambulatory Visit | Attending: Pulmonary Disease | Admitting: Pulmonary Disease

## 2024-10-23 DIAGNOSIS — I2699 Other pulmonary embolism without acute cor pulmonale: Secondary | ICD-10-CM | POA: Diagnosis present

## 2024-10-23 DIAGNOSIS — R918 Other nonspecific abnormal finding of lung field: Secondary | ICD-10-CM | POA: Insufficient documentation

## 2024-10-23 MED ORDER — IOHEXOL 350 MG/ML SOLN
75.0000 mL | Freq: Once | INTRAVENOUS | Status: AC | PRN
  Administered 2024-10-23: 75 mL via INTRAVENOUS

## 2024-12-09 ENCOUNTER — Encounter: Payer: Self-pay | Admitting: Surgery

## 2024-12-09 ENCOUNTER — Ambulatory Visit: Admitting: Surgery

## 2024-12-09 VITALS — BP 117/63 | HR 58 | Ht 69.0 in | Wt 192.0 lb

## 2024-12-09 DIAGNOSIS — E041 Nontoxic single thyroid nodule: Secondary | ICD-10-CM | POA: Diagnosis not present

## 2024-12-09 NOTE — Progress Notes (Signed)
 Outpatient Surgical Follow Up  12/09/2024  Todd Rojas is an 83 y.o. male.   Chief Complaint  Patient presents with   Follow-up    HPI: Todd Rojas is an 83 year old male well-known to me with history of Ogilvie's syndrome requiring robotic loop ileostomy. Did reversal 4 months ago He developed multiple C. difficile infection requiring further hospitalization. He is doing well from an abdominal perspective no fevers no chills tolerating diet no more C. difficile recurrent infections. He is note that he did have postoperative ileus every time we did an operation on him. He also wanted me to give him advice regarding his thyroid  nodule that was found incidentally.  He did have a CT scan that I personally reviewed showing no acute abdominal issues.  He did have an incidental thyroid  nodule that was further biopsied. 2.2 cm solid hypoechoic left inferior nodule, TR-4, with recommended FNA. Afirma results + with no variant detected.   Past Medical History:  Diagnosis Date   Actinic keratosis    Acute ST elevation myocardial infarction (STEMI) of anterior wall (HCC) 01/06/2005   a.) PCI 04/06/2005:100% mLAD (3.0 x 23 mm Cypher)   Adenomatous colon polyp    Anginal pain    Aortic atherosclerosis    Arthritis    Asthma    Basal cell carcinoma 03/17/2020   right ant lat deltoid   CAD (coronary artery disease)    a.) Anterolateral STEMI 01/06/2005: 100% mLAD (3.0 x 23 mm Cypher); b.) LHC/PCI  03/30/2009: 90% pLAD (3.0 x 18 mm Xience DES); c.) LHC/PCI 06/03/2021: 90% LAD (PTCA), 70% pLCx (3.5 x 12 mm DES - unk type), 70% OM (2.5 x 18 mm DES - unk type)   Cholelithiasis    COPD (chronic obstructive pulmonary disease) (HCC) 07/03/2017   DDD (degenerative disc disease), thoracolumbar    Dyspnea    Enlarged prostate    Esophageal stricture s/p dilation    GERD (gastroesophageal reflux disease)    Gout    Hearing loss 03/09/2013   Hemorrhoids    HFrEF (heart failure with reduced  ejection fraction) (HCC)    Hiatal hernia    Hyperlipidemia, unspecified 07/03/2017   Hypertension 02/13/2012   Ileus due to infection Todd Rojas)    a.) s/p ileostomy creation 03/20/2024   Nephrolithiasis    NICM (nonischemic cardiomyopathy) (HCC)    On apixaban  therapy    Parastomal hernia    Pneumonia    Prostate mass 07/09/2024   a.) 2.7 cm PZ lesion in the LEFTposteromedial and posterolateral mid gland - highly suspicious for high-grade carcinoma   Pulmonary embolism (HCC) 03/2024   Right-sided low back pain without sciatica 04/14/2016   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   R ear - ED&C   Status post coronary artery stent placement    a.) TOTAL # STENTS: 4 as of 08/08/2024   Thyroid  nodule    Unintentional weight loss    a.) down 40+ lbs between 02/2024 - 05/2024 --> associated with sepsis, colonic ileus (odor associated with mantainence), and metallic dysgeusia.    Past Surgical History:  Procedure Laterality Date   COLONOSCOPY WITH PROPOFOL  N/A 10/08/2018   Procedure: COLONOSCOPY WITH PROPOFOL ;  Surgeon: Viktoria Lamar DASEN, MD;  Location: [facility];  Service: Endoscopy;  Laterality: N/A;   COLONOSCOPY WITH PROPOFOL  N/A 01/06/2023   Procedure: COLONOSCOPY WITH PROPOFOL ;  Surgeon: Maryruth Ole DASEN, MD;  Location: [facility];  Service: Endoscopy;  Laterality: N/A;   CORONARY ANGIOPLASTY WITH STENT PLACEMENT Left 01/06/2005  Procedure: CORONARY ANGIOPLASTY WITH STENT PLACEMENT; Location: [facility]; Surgeon: Marsa Dooms, MD   CORONARY ANGIOPLASTY WITH STENT PLACEMENT Left 06/03/2021   Procedure: CORONARY ANGIOPLASTY WITH STENT PLACEMENT; Location: [facility]; Surgeon: Manford Blanch, MD   CORONARY ANGIOPLASTY WITH STENT PLACEMENT Left 03/30/2009   Procedure: CORONARY ANGIOPLASTY WITH STENT PLACEMENT; Location: [facility]; Surgeon: Manford Blanch, MD   CREATION, ILEOSTOMY, DIVERTING, ROBOT-ASSISTED  03/2024   CYSTOSCOPY WITH STENT PLACEMENT Left 08/09/2017   Procedure:  CYSTOSCOPY WITH STENT PLACEMENT;  Surgeon: Chauncey Redell Agent, MD;  Location: [facility];  Service: Urology;  Laterality: Left;   FLEXIBLE SIGMOIDOSCOPY N/A 03/17/2024   Procedure: SIGMOIDOSCOPY, FLEXIBLE;  Surgeon: Toledo, Ladell POUR, MD;  Location: [facility];  Service: Gastroenterology;  Laterality: N/A;   ILEOSTOMY CLOSURE N/A 08/09/2024   Procedure: CLOSURE, ILEOSTOMY;  Surgeon: Jordis Laneta FALCON, MD;  Location: [facility];  Service: General;  Laterality: N/A;   LEFT HEART CATH AND CORONARY ANGIOGRAPHY Left 05/16/2008   Procedure: LEFT HEART CATH AND CORONARY ANGIOGRAPHY; Location: [facility]; Surgeon: Marsa Overall, MD   LEFT HEART CATH AND CORONARY ANGIOGRAPHY Left 04/12/2018   Procedure: LEFT HEART CATH AND CORONARY ANGIOGRAPHY; Location: [facility]; Surgeon: Manford Blanch, MD   MEDIAL PARTIAL KNEE REPLACEMENT Bilateral 2016   PARASTOMAL HERNIA REPAIR N/A 08/09/2024   Procedure: REPAIR, HERNIA, PARASTOMAL;  Surgeon: Jordis Laneta FALCON, MD;  Location: [facility];  Service: General;  Laterality: N/A;   URETEROSCOPY WITH HOLMIUM LASER LITHOTRIPSY Left 08/09/2017   Procedure: URETEROSCOPY WITH HOLMIUM LASER LITHOTRIPSY/INCISION OF URETEROCELE;  Surgeon: Chauncey Redell Agent, MD;  Location: [facility];  Service: Urology;  Laterality: Left;   XI ROBOT ASSISTED DIAGNOSTIC LAPAROSCOPY N/A 03/20/2024   Procedure: LAPAROSCOPY, DIAGNOSTIC, ROBOT-ASSISTED;  Surgeon: Jordis Laneta FALCON, MD;  Location: [facility];  Service: General;  Laterality: N/A;    Family History  Problem Relation Age of Onset   Alzheimer's disease Mother    Leukemia Father    Prostate cancer Neg Hx    Kidney cancer Neg Hx    Bladder Cancer Neg Hx     Social History:  reports that he has quit smoking. He has been exposed to tobacco smoke. He quit smokeless tobacco use about 30 years ago.  His smokeless tobacco use included chew. He reports that he does not drink alcohol  and does not use drugs.  Allergies: Allergies[1]  Medications  reviewed.    ROS Full ROS performed and is otherwise negative other than what is stated in HPI   BP 117/63   Pulse (!) 58   Ht 5' 9 (1.753 m)   Wt 192 lb (87.1 kg)   SpO2 96%   BMI 28.35 kg/m   Physical Exam Vitals and nursing note reviewed. Exam conducted with a chaperone present.  Constitutional:      Appearance: Normal appearance.  Abdominal:     General: Abdomen is flat. There is no distension.     Palpations: Abdomen is soft. There is no mass.     Tenderness: There is no abdominal tenderness. There is no guarding or rebound.     Hernia: No hernia is present.     Comments: Soft, nt, scars well healed  Musculoskeletal:     Cervical back: Normal range of motion and neck supple. No rigidity or tenderness.  Skin:    General: Skin is warm and dry.     Capillary Refill: Capillary refill takes less than 2 seconds.  Neurological:     General: No focal deficit present.     Mental Status: He  is alert and oriented to person, place, and time.  Psychiatric:        Mood and Affect: Mood normal.        Behavior: Behavior normal.        Thought Content: Thought content normal.        Judgment: Judgment normal.     Assessment/Plan: Doing well after ileostomy takedown.  No evidence of complications.  Main concern was a thyroid  nodule.  Discussed with him in detail about what thyroidectomy will involve.  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  At his age he is in judgment call. I will refer for him for second opinion with Dr. Milissa. HE appreciated my input. Extensive counseling prtovided I personally spent a total of 40 minutes in the care of the patient today including performing a medically appropriate exam/evaluation, counseling and educating, placing orders, referring and communicating with other health care professionals, documenting clinical information in the EHR, independently interpreting and  reviewing images studies and coordinating care.   Laneta Luna, MD FACS General Surgeon    [1] No Known Allergies

## 2024-12-09 NOTE — Patient Instructions (Addendum)
 We will send a referral to Dr Milissa at [facility] for a second opinion.  They will call you to schedule this appointment.    Follow-up with our office as needed.  Please call and ask to speak with a nurse if you develop questions or concerns.

## 2025-01-16 ENCOUNTER — Other Ambulatory Visit: Payer: Self-pay | Admitting: Otolaryngology

## 2025-01-16 DIAGNOSIS — E041 Nontoxic single thyroid nodule: Secondary | ICD-10-CM

## 2025-02-04 ENCOUNTER — Other Ambulatory Visit

## 2025-02-12 ENCOUNTER — Other Ambulatory Visit

## 2025-02-17 ENCOUNTER — Ambulatory Visit: Admitting: Urology

## 2025-02-18 ENCOUNTER — Ambulatory Visit: Admitting: Dermatology
